# Patient Record
Sex: Male | Born: 1960 | Race: White | Hispanic: No | Smoking: Former smoker
Health system: Southern US, Community
[De-identification: ages and names within clinical notes are randomized; demographics above are authoritative.]

## PROBLEM LIST (undated history)

## (undated) DIAGNOSIS — I10 Essential (primary) hypertension: Secondary | ICD-10-CM

## (undated) DIAGNOSIS — Z95810 Presence of automatic (implantable) cardiac defibrillator: Secondary | ICD-10-CM

## (undated) DIAGNOSIS — J45909 Unspecified asthma, uncomplicated: Secondary | ICD-10-CM

## (undated) DIAGNOSIS — F1320 Sedative, hypnotic or anxiolytic dependence, uncomplicated: Secondary | ICD-10-CM

## (undated) DIAGNOSIS — K5790 Diverticulosis of intestine, part unspecified, without perforation or abscess without bleeding: Secondary | ICD-10-CM

## (undated) DIAGNOSIS — K7460 Unspecified cirrhosis of liver: Secondary | ICD-10-CM

## (undated) DIAGNOSIS — F329 Major depressive disorder, single episode, unspecified: Secondary | ICD-10-CM

## (undated) DIAGNOSIS — N184 Chronic kidney disease, stage 4 (severe): Secondary | ICD-10-CM

## (undated) DIAGNOSIS — F32A Depression, unspecified: Secondary | ICD-10-CM

## (undated) DIAGNOSIS — E079 Disorder of thyroid, unspecified: Secondary | ICD-10-CM

## (undated) DIAGNOSIS — E785 Hyperlipidemia, unspecified: Secondary | ICD-10-CM

## (undated) DIAGNOSIS — M1990 Unspecified osteoarthritis, unspecified site: Secondary | ICD-10-CM

## (undated) DIAGNOSIS — G4730 Sleep apnea, unspecified: Secondary | ICD-10-CM

## (undated) DIAGNOSIS — B1910 Unspecified viral hepatitis B without hepatic coma: Secondary | ICD-10-CM

## (undated) DIAGNOSIS — I5022 Chronic systolic (congestive) heart failure: Secondary | ICD-10-CM

## (undated) DIAGNOSIS — D509 Iron deficiency anemia, unspecified: Secondary | ICD-10-CM

## (undated) DIAGNOSIS — I4819 Other persistent atrial fibrillation: Secondary | ICD-10-CM

## (undated) DIAGNOSIS — I2510 Atherosclerotic heart disease of native coronary artery without angina pectoris: Secondary | ICD-10-CM

## (undated) DIAGNOSIS — I480 Paroxysmal atrial fibrillation: Secondary | ICD-10-CM

## (undated) DIAGNOSIS — H35039 Hypertensive retinopathy, unspecified eye: Secondary | ICD-10-CM

## (undated) DIAGNOSIS — F419 Anxiety disorder, unspecified: Secondary | ICD-10-CM

## (undated) DIAGNOSIS — E669 Obesity, unspecified: Secondary | ICD-10-CM

## (undated) DIAGNOSIS — F1411 Cocaine abuse, in remission: Secondary | ICD-10-CM

## (undated) DIAGNOSIS — I639 Cerebral infarction, unspecified: Secondary | ICD-10-CM

## (undated) HISTORY — DX: Unspecified osteoarthritis, unspecified site: M19.90

## (undated) HISTORY — PX: PACEMAKER INSERTION: SHX728

## (undated) HISTORY — DX: Unspecified viral hepatitis B without hepatic coma: B19.10

## (undated) HISTORY — DX: Iron deficiency anemia, unspecified: D50.9

## (undated) HISTORY — DX: Cocaine abuse, in remission: F14.11

## (undated) HISTORY — DX: Disorder of thyroid, unspecified: E07.9

## (undated) HISTORY — DX: Depression, unspecified: F32.A

## (undated) HISTORY — DX: Sedative, hypnotic or anxiolytic dependence, uncomplicated: F13.20

## (undated) HISTORY — PX: EYE SURGERY: SHX253

## (undated) HISTORY — PX: TONSILLECTOMY: SUR1361

## (undated) HISTORY — DX: Anxiety disorder, unspecified: F41.9

## (undated) HISTORY — DX: Major depressive disorder, single episode, unspecified: F32.9

## (undated) HISTORY — PX: CATARACT EXTRACTION: SUR2

## (undated) HISTORY — DX: Other persistent atrial fibrillation: I48.19

## (undated) HISTORY — PX: LASIK: SHX215

## (undated) HISTORY — DX: Hypertensive retinopathy, unspecified eye: H35.039

## (undated) HISTORY — DX: Unspecified cirrhosis of liver: K74.60

## (undated) HISTORY — DX: Unspecified asthma, uncomplicated: J45.909

## (undated) HISTORY — DX: Chronic systolic (congestive) heart failure: I50.22

## (undated) HISTORY — PX: TRANSTHORACIC ECHOCARDIOGRAM: SHX275

## (undated) HISTORY — DX: Sleep apnea, unspecified: G47.30

## (undated) HISTORY — DX: Essential (primary) hypertension: I10

## (undated) HISTORY — DX: Hyperlipidemia, unspecified: E78.5

## (undated) HISTORY — DX: Diverticulosis of intestine, part unspecified, without perforation or abscess without bleeding: K57.90

---

## 2002-05-02 DIAGNOSIS — I639 Cerebral infarction, unspecified: Secondary | ICD-10-CM

## 2002-05-02 HISTORY — DX: Cerebral infarction, unspecified: I63.9

## 2004-01-01 ENCOUNTER — Emergency Department (HOSPITAL_COMMUNITY): Admission: EM | Admit: 2004-01-01 | Discharge: 2004-01-01 | Payer: Self-pay | Admitting: Emergency Medicine

## 2004-08-02 ENCOUNTER — Emergency Department (HOSPITAL_COMMUNITY): Admission: EM | Admit: 2004-08-02 | Discharge: 2004-08-02 | Payer: Self-pay | Admitting: Emergency Medicine

## 2004-08-26 ENCOUNTER — Ambulatory Visit: Payer: Self-pay | Admitting: Family Medicine

## 2004-08-31 ENCOUNTER — Ambulatory Visit: Payer: Self-pay | Admitting: *Deleted

## 2004-09-09 ENCOUNTER — Ambulatory Visit: Payer: Self-pay | Admitting: Family Medicine

## 2004-12-20 ENCOUNTER — Ambulatory Visit: Payer: Self-pay | Admitting: Family Medicine

## 2005-05-23 ENCOUNTER — Ambulatory Visit: Payer: Self-pay | Admitting: Family Medicine

## 2005-08-01 ENCOUNTER — Ambulatory Visit: Payer: Self-pay | Admitting: Nurse Practitioner

## 2005-10-24 ENCOUNTER — Ambulatory Visit: Payer: Self-pay | Admitting: Family Medicine

## 2005-12-08 ENCOUNTER — Ambulatory Visit (HOSPITAL_COMMUNITY): Payer: Self-pay | Admitting: *Deleted

## 2006-03-16 ENCOUNTER — Ambulatory Visit (HOSPITAL_COMMUNITY): Payer: Self-pay | Admitting: *Deleted

## 2006-06-20 ENCOUNTER — Ambulatory Visit (HOSPITAL_COMMUNITY): Payer: Self-pay | Admitting: *Deleted

## 2006-10-10 ENCOUNTER — Ambulatory Visit (HOSPITAL_COMMUNITY): Payer: Self-pay | Admitting: *Deleted

## 2006-10-25 ENCOUNTER — Ambulatory Visit: Payer: Self-pay | Admitting: Internal Medicine

## 2006-11-09 ENCOUNTER — Inpatient Hospital Stay (HOSPITAL_COMMUNITY): Admission: EM | Admit: 2006-11-09 | Discharge: 2006-11-15 | Payer: Self-pay | Admitting: Emergency Medicine

## 2006-11-09 ENCOUNTER — Ambulatory Visit: Payer: Self-pay | Admitting: Cardiology

## 2006-11-09 ENCOUNTER — Ambulatory Visit: Payer: Self-pay | Admitting: Internal Medicine

## 2006-12-14 ENCOUNTER — Ambulatory Visit: Payer: Self-pay | Admitting: Internal Medicine

## 2007-01-10 ENCOUNTER — Emergency Department (HOSPITAL_COMMUNITY): Admission: EM | Admit: 2007-01-10 | Discharge: 2007-01-10 | Payer: Self-pay | Admitting: Emergency Medicine

## 2007-01-17 ENCOUNTER — Encounter (INDEPENDENT_AMBULATORY_CARE_PROVIDER_SITE_OTHER): Payer: Self-pay | Admitting: *Deleted

## 2007-05-17 ENCOUNTER — Ambulatory Visit (HOSPITAL_COMMUNITY): Payer: Self-pay | Admitting: *Deleted

## 2007-08-13 ENCOUNTER — Encounter (INDEPENDENT_AMBULATORY_CARE_PROVIDER_SITE_OTHER): Payer: Self-pay | Admitting: Family Medicine

## 2007-08-13 ENCOUNTER — Ambulatory Visit: Payer: Self-pay | Admitting: Internal Medicine

## 2007-08-13 LAB — CONVERTED CEMR LAB
Alkaline Phosphatase: 64 units/L (ref 39–117)
BUN: 29 mg/dL — ABNORMAL HIGH (ref 6–23)
Glucose, Bld: 73 mg/dL (ref 70–99)
HDL: 36 mg/dL — ABNORMAL LOW (ref >39)
LDL Cholesterol: 109 mg/dL — ABNORMAL HIGH (ref 0–99)
Total Bilirubin: 0.5 mg/dL (ref 0.3–1.2)
Total CHOL/HDL Ratio: 5.6
Triglycerides: 283 mg/dL — ABNORMAL HIGH (ref <150)
VLDL: 57 mg/dL — ABNORMAL HIGH (ref 0–40)

## 2007-08-30 ENCOUNTER — Ambulatory Visit: Payer: Self-pay | Admitting: Internal Medicine

## 2009-10-03 ENCOUNTER — Inpatient Hospital Stay (HOSPITAL_COMMUNITY): Admission: EM | Admit: 2009-10-03 | Discharge: 2009-10-08 | Payer: Self-pay | Admitting: Emergency Medicine

## 2009-10-03 ENCOUNTER — Ambulatory Visit: Payer: Self-pay | Admitting: Cardiology

## 2009-10-05 ENCOUNTER — Encounter: Payer: Self-pay | Admitting: Cardiovascular Disease

## 2009-10-05 ENCOUNTER — Ambulatory Visit: Payer: Self-pay | Admitting: Psychiatry

## 2009-10-16 ENCOUNTER — Ambulatory Visit: Payer: Self-pay | Admitting: Family Medicine

## 2009-12-18 ENCOUNTER — Ambulatory Visit: Payer: Self-pay | Admitting: Cardiovascular Disease

## 2009-12-18 DIAGNOSIS — I10 Essential (primary) hypertension: Secondary | ICD-10-CM | POA: Insufficient documentation

## 2009-12-18 DIAGNOSIS — I5022 Chronic systolic (congestive) heart failure: Secondary | ICD-10-CM

## 2010-01-11 ENCOUNTER — Ambulatory Visit: Payer: Self-pay | Admitting: Cardiovascular Disease

## 2010-01-11 DIAGNOSIS — E785 Hyperlipidemia, unspecified: Secondary | ICD-10-CM | POA: Insufficient documentation

## 2010-01-20 ENCOUNTER — Telehealth (INDEPENDENT_AMBULATORY_CARE_PROVIDER_SITE_OTHER): Payer: Self-pay | Admitting: *Deleted

## 2010-01-21 ENCOUNTER — Ambulatory Visit: Payer: Self-pay | Admitting: Cardiology

## 2010-01-21 ENCOUNTER — Encounter: Payer: Self-pay | Admitting: Cardiovascular Disease

## 2010-01-21 ENCOUNTER — Ambulatory Visit (HOSPITAL_COMMUNITY): Admission: RE | Admit: 2010-01-21 | Discharge: 2010-01-21 | Payer: Self-pay | Admitting: Cardiovascular Disease

## 2010-01-21 ENCOUNTER — Ambulatory Visit: Payer: Self-pay

## 2010-01-21 ENCOUNTER — Encounter (INDEPENDENT_AMBULATORY_CARE_PROVIDER_SITE_OTHER): Payer: Self-pay | Admitting: *Deleted

## 2010-01-21 ENCOUNTER — Ambulatory Visit: Payer: Self-pay | Admitting: Internal Medicine

## 2010-02-15 ENCOUNTER — Ambulatory Visit: Payer: Self-pay | Admitting: Cardiovascular Disease

## 2010-02-16 ENCOUNTER — Encounter: Payer: Self-pay | Admitting: Cardiovascular Disease

## 2010-03-08 ENCOUNTER — Emergency Department (HOSPITAL_COMMUNITY)
Admission: EM | Admit: 2010-03-08 | Discharge: 2010-03-08 | Payer: Self-pay | Source: Home / Self Care | Admitting: Emergency Medicine

## 2010-03-15 ENCOUNTER — Ambulatory Visit: Payer: Self-pay | Admitting: Cardiovascular Disease

## 2010-03-16 LAB — CONVERTED CEMR LAB
BUN: 31 mg/dL — ABNORMAL HIGH (ref 6–23)
Calcium: 8.8 mg/dL (ref 8.4–10.5)
Creatinine, Ser: 1.7 mg/dL — ABNORMAL HIGH (ref 0.4–1.5)
Eosinophils Relative: 2.5 % (ref 0.0–5.0)
GFR calc non Af Amer: 47.34 mL/min (ref >60)
INR: 1 (ref 0.8–1.0)
Lymphocytes Relative: 17.5 % (ref 12.0–46.0)
Monocytes Relative: 9 % (ref 3.0–12.0)
Neutrophils Relative %: 70.6 % (ref 43.0–77.0)
Platelets: 187 10*3/uL (ref 150.0–400.0)
Potassium: 4.2 meq/L (ref 3.5–5.1)
Prothrombin Time: 10.7 s (ref 9.7–11.8)
WBC: 8.2 10*3/uL (ref 4.5–10.5)

## 2010-03-17 ENCOUNTER — Ambulatory Visit: Payer: Self-pay | Admitting: Cardiovascular Disease

## 2010-03-17 ENCOUNTER — Ambulatory Visit (HOSPITAL_COMMUNITY)
Admission: RE | Admit: 2010-03-17 | Discharge: 2010-03-17 | Payer: Self-pay | Source: Home / Self Care | Admitting: Cardiovascular Disease

## 2010-04-12 ENCOUNTER — Ambulatory Visit: Payer: Self-pay | Admitting: Cardiovascular Disease

## 2010-04-12 DIAGNOSIS — E7800 Pure hypercholesterolemia, unspecified: Secondary | ICD-10-CM

## 2010-04-19 ENCOUNTER — Encounter: Payer: Self-pay | Admitting: Cardiovascular Disease

## 2010-04-19 ENCOUNTER — Ambulatory Visit: Payer: Self-pay | Admitting: Cardiovascular Disease

## 2010-04-19 LAB — CONVERTED CEMR LAB
ALT: 63 units/L — ABNORMAL HIGH (ref 0–53)
AST: 43 units/L — ABNORMAL HIGH (ref 0–37)
Albumin: 3.7 g/dL (ref 3.5–5.2)
Alkaline Phosphatase: 69 units/L (ref 39–117)
HDL: 32.5 mg/dL — ABNORMAL LOW (ref >39.00)
Triglycerides: 84 mg/dL (ref 0.0–149.0)

## 2010-04-26 DIAGNOSIS — I2510 Atherosclerotic heart disease of native coronary artery without angina pectoris: Secondary | ICD-10-CM | POA: Insufficient documentation

## 2010-05-27 ENCOUNTER — Ambulatory Visit
Admission: RE | Admit: 2010-05-27 | Discharge: 2010-05-27 | Payer: Self-pay | Source: Home / Self Care | Attending: Internal Medicine | Admitting: Internal Medicine

## 2010-05-30 LAB — CONVERTED CEMR LAB
AST: 38 units/L — ABNORMAL HIGH (ref 0–37)
Albumin: 3.7 g/dL (ref 3.5–5.2)
BUN: 28 mg/dL — ABNORMAL HIGH (ref 6–23)
Basophils Relative: 0.3 % (ref 0.0–3.0)
Calcium: 9.2 mg/dL (ref 8.4–10.5)
Cholesterol: 178 mg/dL (ref 0–200)
Creatinine, Ser: 1.6 mg/dL — ABNORMAL HIGH (ref 0.4–1.5)
Eosinophils Absolute: 0.2 10*3/uL (ref 0.0–0.7)
GFR calc non Af Amer: 49.09 mL/min (ref >60)
Glucose, Bld: 61 mg/dL — ABNORMAL LOW (ref 70–99)
HDL: 36.2 mg/dL — ABNORMAL LOW (ref >39.00)
MCHC: 33.4 g/dL (ref 30.0–36.0)
MCV: 80.7 fL (ref 78.0–100.0)
Monocytes Absolute: 0.8 10*3/uL (ref 0.1–1.0)
Neutrophils Relative %: 70.6 % (ref 43.0–77.0)
RBC: 5.94 M/uL — ABNORMAL HIGH (ref 4.22–5.81)
VLDL: 26.6 mg/dL (ref 0.0–40.0)

## 2010-06-03 NOTE — Assessment & Plan Note (Signed)
Summary: 4 WKS/D.MILLER  Medications Added CARVEDILOL 25 MG TABS (CARVEDILOL) Take one tablet by mouth twice a day HYDRALAZINE HCL 50 MG TABS (HYDRALAZINE HCL) Take one tablet by mouth three times a day VENTOLIN HFA 108 (90 BASE) MCG/ACT AERS (ALBUTEROL SULFATE) 2 puffs as needed PRAVASTATIN SODIUM 40 MG TABS (PRAVASTATIN SODIUM) Take one tablet by mouth daily at bedtime        Visit Type:  Follow-up Primary Provider:  Dr Leward Quan  CC:  No complaints.  History of Present Illness: 50 year-old gentleman presenting for hospital follow-up after recent hospitalization for acute systolic heart failure. He has a long history of untreated malignant HTN and presented with CHF symptoms in the setting of severe HTN. An echo showed that his LVEF was less than 20% with global LV dysfunction and 4-chamber dilatation. Cardiac cath was not pursued because of significant renal dysfunction, presumably secondary to his HTN.  Overall the patient is doing well. He reports some improvement in BP readings, but still the majority of readings are elevated. He feels poorly when his BP is in the normal range. No chest pain, dyspnea, edema, or syncope. He has frequent diaphoresis.  Current Medications (verified): 1)  Carvedilol 12.5 Mg Tabs (Carvedilol) .... One Twice A Day 2)  Ferrous Sulfate 325 (65 Fe) Mg Tabs (Ferrous Sulfate) .... Take 1 Tablet By Mouth Two Times A Day 3)  Hydralazine Hcl 25 Mg Tabs (Hydralazine Hcl) .... Take 1 Tablet By Mouth Three Times A Day 4)  Isosorbide Mononitrate Cr 60 Mg Xr24h-Tab (Isosorbide Mononitrate) .... One Every Evening 5)  Lasix 40 Mg Tabs (Furosemide) .... Take 1 Tablet By Mouth Two Times A Day 6)  Potassium Chloride Crys Cr 20 Meq Cr-Tabs (Potassium Chloride Crys Cr) .... Take One Tablet By Mouth Twice A Day 7)  Ventolin Hfa 108 (90 Base) Mcg/act Aers (Albuterol Sulfate) .... 2 Puffs As Needed 8)  Aspirin 81 Mg Tbec (Aspirin) .... Take One Tablet By Mouth Daily 9)   Trazodone Hcl 50 Mg Tabs (Trazodone Hcl) .... As Needed 10)  Xanax 2 Mg Tabs (Alprazolam) .... Take 1 Tablet By Mouth Four Times A Day  Allergies: 1)  ! Codeine 2)  ! Vicodin  Past History:  Past medical history reviewed for relevance to current acute and chronic problems.  Past Medical History:  1. Acute decompensation of chronic systolic congestive heart failure, LVEF less than 20%.   2. Severe uncontrolled hypertension/hypertensive urgency.   3. History of cocaine use.   4. 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   5. Bronchial asthma.   6. Benzodiazepine dependency.   7. History of anxiety/depression.   8. Chronic kidney disease stage 3-4.   9. Microcytic anemia.   Review of Systems       Negative except as per HPI   Vital Signs:  Patient profile:   50 year old male Height:      74 inches Weight:      275.75 pounds BMI:     35.53 Pulse rate:   72 / minute Pulse rhythm:   regular Resp:     18 per minute BP sitting:   170 / 110  (left arm) Cuff size:   large  Vitals Entered By: Sidney Ace (January 11, 2010 4:19 PM)  Physical Exam  General:  Pt is alert and oriented, in no acute distress. HEENT: normal Neck: normal carotid upstrokes without bruits, JVP normal Lungs: CTA CV: RRR without murmur or gallop Abd: soft, NT, positive BS, no bruit,  no organomegaly Ext: no clubbing, cyanosis, or edema. peripheral pulses 2+ and equal Skin: warm and dry without rash    Impression & Recommendations:  Problem # 1:  HYPERTENSION, HEART CONTROLLED W/ CHF (ICD-402.11) Recommend increase carvedilol to 25 mg two times a day and hydralazine to 50 mg three times a day. Continue to monitor BP's at home. Call if symptoms of hypotension occur (these were reviewed with the patient today).  His updated medication list for this problem includes:    Carvedilol 25 Mg Tabs (Carvedilol) .Marland Kitchen... Take one tablet by mouth twice a day    Hydralazine Hcl 50 Mg Tabs (Hydralazine hcl)  .Marland Kitchen... Take one tablet by mouth three times a day    Lasix 40 Mg Tabs (Furosemide) .Marland Kitchen... Take 1 tablet by mouth two times a day    Aspirin 81 Mg Tbec (Aspirin) .Marland Kitchen... Take one tablet by mouth daily  Orders: Nuclear Stress Test (Nuc Stress Test) Echocardiogram (Echo)  Problem # 2:  CHRONIC SYSTOLIC HEART FAILURE (123456) Tolerating medical therapy well. Will followup with an echocardiogram to reassess LV function. Will consider starting an ACE or ARB if his renal function remains stable (creatinine 1.6 mg/dL on recent check). Also will check a pharmacolgic Myoview stress study to rule out ischemia as a cause of his cardiomyopathy, but I suspect this is related to uncontrolled HTN and polysubstance abuse.  His updated medication list for this problem includes:    Carvedilol 25 Mg Tabs (Carvedilol) .Marland Kitchen... Take one tablet by mouth twice a day    Isosorbide Mononitrate Cr 60 Mg Xr24h-tab (Isosorbide mononitrate) ..... One every evening    Lasix 40 Mg Tabs (Furosemide) .Marland Kitchen... Take 1 tablet by mouth two times a day    Aspirin 81 Mg Tbec (Aspirin) .Marland Kitchen... Take one tablet by mouth daily  Orders: Nuclear Stress Test (Nuc Stress Test) Echocardiogram (Echo)  Problem # 3:  HYPERLIPIDEMIA-MIXED (ICD-272.4) Recommend start prachol 40 mg daily and f/u lipids and lft's in 3 months.  His updated medication list for this problem includes:    Pravastatin Sodium 40 Mg Tabs (Pravastatin sodium) .Marland Kitchen... Take one tablet by mouth daily at bedtime  CHOL: 178 (12/18/2009)   LDL: 115 (12/18/2009)   HDL: 36.20 (12/18/2009)   TG: 133.0 (12/18/2009)  Patient Instructions: 1)  Your physician recommends that you schedule a follow-up appointment in: 2 months 2)  Your physician has recommended you make the following change in your medication: INCREASE CARVEDILOL to 25mg  by mouth two times a day, INCREASE HYDRALAZINE to 50mg  three times a day by mouth, ADD PRAVASTATIN 40mg  by mouth daily. 3)  Your physician has requested  that you have an echocardiogram.  Echocardiography is a painless test that uses sound waves to create images of your heart. It provides your doctor with information about the size and shape of your heart and how well your heart's chambers and valves are working.  This procedure takes approximately one hour. There are no restrictions for this procedure. 4)  Your physician has requested that you have an [place].  For further information please visit [email or web address].  Please follow instruction sheet, as given. 5)  Your physician recommends that you return for a FASTING lipid profile and liver function test in 3 months. Prescriptions: PRAVASTATIN SODIUM 40 MG TABS (PRAVASTATIN SODIUM) Take one tablet by mouth daily at bedtime  #30 x 3   Entered by:   Whitney Jannett Celestine RN   Authorized by:   Neale Burly, MD   Signed  by:   Mikle Bosworth RN on 01/11/2010   Method used:   Faxed to ...       [facility] (retail)       [place], [place]  [address]       Ph: [phone] [place]       Fax: [phone]   RxID:   [id number] HYDRALAZINE HCL 50 MG TABS (HYDRALAZINE HCL) Take one tablet by mouth three times a day  #90 x 3   Entered by:   Whitney Jannett Celestine RN   Authorized by:   Neale Burly, MD   Signed by:   Mikle Bosworth RN on 01/11/2010   Method used:   Faxed to ...       [facility] (retail)       [place], [place]  [address]       Ph: [phone]       Fax: [phone]   RxID:   [id number] CARVEDILOL 25 MG TABS (CARVEDILOL) Take one tablet by mouth twice a day  #60 x 3   Entered by:   Whitney Jannett Celestine RN   Authorized by:   Neale Burly, MD   Signed by:   Mikle Bosworth RN on 01/11/2010   Method used:   Faxed to ...       [facility] (retail)       [place],   [address]        Ph: [phone]       Fax: [phone]   RxID:   [id number]

## 2010-06-03 NOTE — Assessment & Plan Note (Signed)
Summary: eph   Visit Type:  Post-hospital Primary Provider:  Dr Leward Quan  CC:  Chest congestion.  History of Present Illness: 50 year-old gentleman presenting for malignant HTN and severely depressed LV function. He recently underwent cardiac cath showing severe diagonal stenosis and moderate RCA stenosis. Medical therapy was recommended because of an absence of angina. The patient reports no new compaints today. BP continues to run very high, even on multiple antihypertensives. Denies chest pain or dyspnea. He complains of generalized fatigue.  Current Medications (verified): 1)  Carvedilol 25 Mg Tabs (Carvedilol) .... Take One Tablet By Mouth Twice A Day 2)  Ferrous Sulfate 325 (65 Fe) Mg Tabs (Ferrous Sulfate) .... Take 1 Tablet By Mouth Two Times A Day 3)  Hydralazine Hcl 50 Mg Tabs (Hydralazine Hcl) .... Take One Tablet By Mouth Three Times A Day 4)  Isosorbide Mononitrate Cr 30 Mg Xr24h-Tab (Isosorbide Mononitrate) .... Take  2 Tablet By Mouth Daily 5)  Lasix 40 Mg Tabs (Furosemide) .... Take 1 Tablet By Mouth Two Times A Day 6)  Potassium Chloride Crys Cr 20 Meq Cr-Tabs (Potassium Chloride Crys Cr) .... Take One Tablet By Mouth Twice A Day 7)  Ventolin Hfa 108 (90 Base) Mcg/act Aers (Albuterol Sulfate) .... 2 Puffs As Needed 8)  Aspirin 81 Mg Tbec (Aspirin) .... Take One Tablet By Mouth Daily 9)  Trazodone Hcl 50 Mg Tabs (Trazodone Hcl) .... As Needed 10)  Xanax 2 Mg Tabs (Alprazolam) .... Take 1 Tablet By Mouth Four Times A Day 11)  Pravastatin Sodium 40 Mg Tabs (Pravastatin Sodium) .... Take One Tablet By Mouth Daily At Bedtime  Allergies: 1)  ! Codeine 2)  ! Vicodin  Past History:  Past medical history reviewed for relevance to current acute and chronic problems.  Past Medical History: Reviewed history from 01/11/2010 and no changes required.  1. Acute decompensation of chronic systolic congestive heart failure, LVEF less than 20%.   2. Severe uncontrolled  hypertension/hypertensive urgency.   3. History of cocaine use.   4. 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   5. Bronchial asthma.   6. Benzodiazepine dependency.   7. History of anxiety/depression.   8. Chronic kidney disease stage 3-4.   9. Microcytic anemia.   Review of Systems       Negative except as per HPI   Vital Signs:  Patient profile:   50 year old male Height:      74 inches Weight:      284.50 pounds BMI:     36.66 Pulse rate:   64 / minute Pulse rhythm:   regular Resp:     18 per minute BP sitting:   180 / 100  (left arm) Cuff size:   large  Vitals Entered By: Sidney Ace (April 19, 2010 3:33 PM)  Physical Exam  General:  Pt is alert and oriented, in no acute distress. HEENT: normal Neck: normal carotid upstrokes without bruits, JVP normal Lungs: CTA CV: RRR without murmur or gallop Abd: soft, NT, positive BS, no bruit, no organomegaly Ext: no clubbing, cyanosis, or edema. peripheral pulses 2+ and equal Skin: warm and dry without rash    EKG  Procedure date:  04/19/2010  Findings:      NSR 64 bpm, nonspecific T wave abnormality  Impression & Recommendations:  Problem # 1:  HYPERTENSION, HEART CONTROLLED W/ CHF (ICD-402.11) BP remains uncontrolled. Renals were widely patent at cath. Double carvedilol to 50 mg two times a day and add norvasc 5 mg daily.  His updated medication list for this problem includes:    Carvedilol 25 Mg Tabs (Carvedilol) .Marland Kitchen... Take two tablets by mouth twice a day    Hydralazine Hcl 50 Mg Tabs (Hydralazine hcl) .Marland Kitchen... Take one tablet by mouth three times a day    Lasix 40 Mg Tabs (Furosemide) .Marland Kitchen... Take 1 tablet by mouth two times a day    Aspirin 81 Mg Tbec (Aspirin) .Marland Kitchen... Take one tablet by mouth daily    Amlodipine Besylate 5 Mg Tabs (Amlodipine besylate) .Marland Kitchen... Take one tablet by mouth daily  Orders: EKG w/ Interpretation (93000) EP Referral (Cardiology EP Ref )  BP today: 180/100 Prior BP: 180/110  (02/15/2010)  Labs Reviewed: K+: 4.2 (03/15/2010) Creat: : 1.7 (03/15/2010)   Chol: 129 (04/12/2010)   HDL: 32.50 (04/12/2010)   LDL: 80 (04/12/2010)   TG: 84.0 (04/12/2010)  Problem # 2:  PURE HYPERCHOLESTEROLEMIA (ICD-272.0) Lipids at goal on current Rx.  His updated medication list for this problem includes:    Pravastatin Sodium 40 Mg Tabs (Pravastatin sodium) .Marland Kitchen... Take one tablet by mouth daily at bedtime  CHOL: 129 (04/12/2010)   LDL: 80 (04/12/2010)   HDL: 32.50 (04/12/2010)   TG: 84.0 (04/12/2010)  Problem # 3:  CHRONIC SYSTOLIC HEART FAILURE (123456) Class 2 symptoms, LVEF less than 35%. Suspect nonischemic cardiomyopathy (LV dysfunction far out of proportion extent of CAD). Med changes as outlined. EP consult for consideration of ICD.  His updated medication list for this problem includes:    Carvedilol 25 Mg Tabs (Carvedilol) .Marland Kitchen... Take two tablets by mouth twice a day    Isosorbide Mononitrate Cr 30 Mg Xr24h-tab (Isosorbide mononitrate) .Marland Kitchen... Take  2 tablet by mouth daily    Lasix 40 Mg Tabs (Furosemide) .Marland Kitchen... Take 1 tablet by mouth two times a day    Aspirin 81 Mg Tbec (Aspirin) .Marland Kitchen... Take one tablet by mouth daily    Amlodipine Besylate 5 Mg Tabs (Amlodipine besylate) .Marland Kitchen... Take one tablet by mouth daily  Orders: EKG w/ Interpretation (93000) EP Referral (Cardiology EP Ref )  Problem # 4:  CAD, NATIVE VESSEL (ICD-414.01) Medical therapy - no anginal symptoms.  His updated medication list for this problem includes:    Carvedilol 25 Mg Tabs (Carvedilol) .Marland Kitchen... Take two tablets by mouth twice a day    Isosorbide Mononitrate Cr 30 Mg Xr24h-tab (Isosorbide mononitrate) .Marland Kitchen... Take  2 tablet by mouth daily    Aspirin 81 Mg Tbec (Aspirin) .Marland Kitchen... Take one tablet by mouth daily    Amlodipine Besylate 5 Mg Tabs (Amlodipine besylate) .Marland Kitchen... Take one tablet by mouth daily  Patient Instructions: 1)  Your physician recommends that you schedule a follow-up appointment in: 3  MONTHS 2)  Your physician has recommended you make the following change in your medication: INCREASE Carvedilol to 25mg  take two tablets by mouth two times a day, START Amlodipine 5mg  take one tablet by mouth daily 3)  You have been referred to EP for ICD consideration.  Prescriptions: AMLODIPINE BESYLATE 5 MG TABS (AMLODIPINE BESYLATE) Take one tablet by mouth daily  #30 x 6   Entered by:   Theodosia Quay, RN, BSN   Authorized by:   Neale Burly, MD   Signed by:   Theodosia Quay, RN, BSN on 04/19/2010   Method used:   Print then Give to Patient   RxID:   [id number] CARVEDILOL 25 MG TABS (CARVEDILOL) Take two tablets by mouth twice a day  #120 x 6   Entered by:  Theodosia Quay, RN, BSN   Authorized by:   Neale Burly, MD   Signed by:   Theodosia Quay, RN, BSN on 04/19/2010   Method used:   Print then Give to Patient   RxID:   [id number]

## 2010-06-03 NOTE — Miscellaneous (Signed)
Summary: Myoview cancelled due to marked HTN  Patient here for myoview with baseline BP sitting of 193/136 and supine 207/128.   He had held his Coreg this a.m. for is myoview.   Dr. Harold Hedge gave order for clonidine 0.1 mg and recheck BP in 30-minutes.  2:03 pm clonidine 0.1mg , 2:40 pm BP (LA) 220/130, (RA) 210/120, repeat clonidine 0.1mg  per Dr. Verl Blalock; 3:10 pm BP 189/112 supine, 174/130 sitting; 3:20 pm repeat clonidine 0.1mg  per Dr. Verl Blalock, cancel myoview and Dr. Verl Blalock to see patient.  Appended Document: Myoview cancelled due to marked HTN I had him brought to her area. We gave him a supplemental nitroglycerin after he had 3 clonidine 0.1 tablets. His blood pressure is now down to 178/112 which he says is normal for him the office. He clearly has an anxiety disorder as he stated that it dropped his pressure when he comes the office. He is clinically stable with no headache, chest pain, shortness of breath or any other type of decompensation.  Advised to take his carvedilol when he gets which is 25 mg. We will reschedule his [place] scan but hadn't taken his carvedilol prior that study regardless. He will probably be high again but if we can achieve a study as ordered by Dr. Burt Knack.  Total times a day with patient care was 30 minutes to 45 minutes.

## 2010-06-03 NOTE — Assessment & Plan Note (Signed)
Summary: 4:30/discuss echo results and need for cath   Visit Type:  Follow-up Primary Provider:  Dr Leward Quan  CC:  Anxiety.  History of Present Illness: 50 year-old gentleman presenting for malignant HTN and severely depressed LV function. He has an LVEF less than 20%, but has not had an ischemic evaluation as of yet. He was scheduled for a nuclear study but this was cancelled because of markedly elevated BP. He has not undergone catheterization because of renal dysfunction.  The patient reports no exertional symptoms at present. He has episodes where he feels lethargic, but otherwise has no complaints at present. He denies chest pain, dyspnea, or edema. He reports labile BP's, as low as 120/70's but up to 180/110 in the early morning or when he is upset.  He is having some difficulty keeping up with his medications through [facility].  Current Medications (verified): 1)  Carvedilol 25 Mg Tabs (Carvedilol) .... Take One Tablet By Mouth Twice A Day 2)  Ferrous Sulfate 325 (65 Fe) Mg Tabs (Ferrous Sulfate) .... Take 1 Tablet By Mouth Two Times A Day 3)  Hydralazine Hcl 50 Mg Tabs (Hydralazine Hcl) .... Take One Tablet By Mouth Three Times A Day 4)  Isosorbide Mononitrate Cr 30 Mg Xr24h-Tab (Isosorbide Mononitrate) .... Take  2 Tablet By Mouth Daily 5)  Lasix 40 Mg Tabs (Furosemide) .... Take 1 Tablet By Mouth Two Times A Day 6)  Potassium Chloride Crys Cr 20 Meq Cr-Tabs (Potassium Chloride Crys Cr) .... Take One Tablet By Mouth Twice A Day 7)  Ventolin Hfa 108 (90 Base) Mcg/act Aers (Albuterol Sulfate) .... 2 Puffs As Needed 8)  Aspirin 81 Mg Tbec (Aspirin) .... Take One Tablet By Mouth Daily 9)  Trazodone Hcl 50 Mg Tabs (Trazodone Hcl) .... As Needed 10)  Xanax 2 Mg Tabs (Alprazolam) .... Take 1 Tablet By Mouth Four Times A Day 11)  Pravastatin Sodium 40 Mg Tabs (Pravastatin Sodium) .... Take One Tablet By Mouth Daily At Bedtime  Allergies: 1)  ! Codeine 2)  ! Vicodin  Past  History:  Past medical history reviewed for relevance to current acute and chronic problems.  Past Medical History: Reviewed history from 01/11/2010 and no changes required.  1. Acute decompensation of chronic systolic congestive heart failure, LVEF less than 20%.   2. Severe uncontrolled hypertension/hypertensive urgency.   3. History of cocaine use.   4. 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   5. Bronchial asthma.   6. Benzodiazepine dependency.   7. History of anxiety/depression.   8. Chronic kidney disease stage 3-4.   9. Microcytic anemia.   Review of Systems       Negative except as per HPI   Vital Signs:  Patient profile:   50 year old male Height:      74 inches Weight:      281.50 pounds BMI:     36.27 Pulse rate:   64 / minute Pulse rhythm:   regular Resp:     20 per minute BP sitting:   180 / 110  (left arm) Cuff size:   large  Vitals Entered By: Sidney Ace (February 15, 2010 4:38 PM)  Physical Exam  General:  Pt is alert and oriented, in no acute distress. HEENT: normal Neck: normal carotid upstrokes without bruits, JVP normal Lungs: CTA CV: RRR without murmur or gallop Abd: soft, NT, positive BS, no bruit, no organomegaly Ext: no clubbing, cyanosis, or edema. peripheral pulses 2+ and equal Skin: warm and dry without rash  EKG  Procedure date:  02/15/2010  Findings:      NSR 64 bpm, T wave abnormality consider lateral ischemia.  Echocardiogram  Procedure date:  01/21/2010  Findings:      Study Conclusions            - Left ventricle: The cavity size was moderately dilated. Wall       thickness was increased in a pattern of moderate LVH. There was       moderate concentric hypertrophy. Systolic function was severely       reduced. The estimated ejection fraction was in the range of 20%       to 25%. Diffuse hypokinesis. Doppler parameters are consistent       with abnormal left ventricular relaxation (grade 1 diastolic        dysfunction). Doppler parameters are consistent with high       ventricular filling pressure.     - Left atrium: The atrium was moderately dilated.     - Right ventricle: Hypertrophy was present.     - Right atrium: The atrium was mildly dilated.  Impression & Recommendations:  Problem # 1:  CHRONIC SYSTOLIC HEART FAILURE (123456) Pt's LV function remains severely depressed, despite several months of medical therapy and cessation of substance abuse. I think coronary angiography is indicated to evaluate CAD as a possible cause of cardiomyopathy. Risks, indication, and alternatives were reviewed in detail with the patient and he agrees to proceed.   His updated medication list for this problem includes:    Carvedilol 25 Mg Tabs (Carvedilol) .Marland Kitchen... Take one tablet by mouth twice a day    Isosorbide Mononitrate Cr 30 Mg Xr24h-tab (Isosorbide mononitrate) .Marland Kitchen... Take  2 tablet by mouth daily    Lasix 40 Mg Tabs (Furosemide) .Marland Kitchen... Take 1 tablet by mouth two times a day    Aspirin 81 Mg Tbec (Aspirin) .Marland Kitchen... Take one tablet by mouth daily  Orders: EKG w/ Interpretation (93000) Cardiac Catheterization (Cardiac Cath)  Problem # 2:  HYPERTENSION, HEART CONTROLLED W/ CHF (ICD-402.11) BP is labile with marked white coat HTN. Will continue current Rx, plan renal angio at the time of cath if creatinine is ok. ACE/ARB is contraindicated because of renal dysfunction, but may consider a trial of this if the patient's creatiinine is < 2.0 mg/dL after his cardiac cath.  His updated medication list for this problem includes:    Carvedilol 25 Mg Tabs (Carvedilol) .Marland Kitchen... Take one tablet by mouth twice a day    Hydralazine Hcl 50 Mg Tabs (Hydralazine hcl) .Marland Kitchen... Take one tablet by mouth three times a day    Lasix 40 Mg Tabs (Furosemide) .Marland Kitchen... Take 1 tablet by mouth two times a day    Aspirin 81 Mg Tbec (Aspirin) .Marland Kitchen... Take one tablet by mouth daily  Problem # 3:  HYPERLIPIDEMIA-MIXED (ICD-272.4) Assessment:  Unchanged  His updated medication list for this problem includes:    Pravastatin Sodium 40 Mg Tabs (Pravastatin sodium) .Marland Kitchen... Take one tablet by mouth daily at bedtime  CHOL: 178 (12/18/2009)   LDL: 115 (12/18/2009)   HDL: 36.20 (12/18/2009)   TG: 133.0 (12/18/2009)  Patient Instructions: 1)  Your physician recommends that you schedule a follow-up appointment. To be determined post cath. 2)  Your physician recommends that you continue on your current medications as directed. Please refer to the Current Medication list given to you today. 3)  Your physician has requested that you have a cardiac catheterization on 03/17/10 @ 8:30am. Please  be there at 6:30 am.   Cardiac catheterization is used to diagnose and/or treat various heart conditions. Doctors may recommend this procedure for a number of different reasons. The most common reason is to evaluate chest pain. Chest pain can be a symptom of coronary artery disease (CAD), and cardiac catheterization can show whether plaque is narrowing or blocking your heart's arteries. This procedure is also used to evaluate the valves, as well as measure the blood flow and oxygen levels in different parts of your heart.  For further information please visit [email or web address].  Please follow instruction sheet, as given.

## 2010-06-03 NOTE — Assessment & Plan Note (Signed)
Summary: to discuss ICD/saf   Visit Type:  Follow-up Primary Provider:  Dr Leward Quan  CC:  abdominal discomfort after taking meds. joint pain. chest congestion.  History of Present Illness: Cody Ortega is referred today by Dr. Burt Knack for consideration for ICD implant. He is a pleasant 50 yo man with a DCM/ICM with class 2 CHF, s/p MI. He has never had syncope. He is able to do almost anything he wants on good days. On bad days, he has class 3 symptoms. He currently denies c/p or peripheral edema. He has an EF 20-25%. No other complaints.  Current Medications (verified): 1)  Carvedilol 25 Mg Tabs (Carvedilol) .... Take Two Tablets By Mouth Twice A Day 2)  Ferrous Sulfate 325 (65 Fe) Mg Tabs (Ferrous Sulfate) .... Take 1 Tablet By Mouth Two Times A Day 3)  Hydralazine Hcl 50 Mg Tabs (Hydralazine Hcl) .... Take One Tablet By Mouth Three Times A Day 4)  Isosorbide Mononitrate Cr 30 Mg Xr24h-Tab (Isosorbide Mononitrate) .... Take  2 Tablet By Mouth Daily 5)  Lasix 40 Mg Tabs (Furosemide) .... Take 1 Tablet By Mouth Two Times A Day 6)  Potassium Chloride Crys Cr 20 Meq Cr-Tabs (Potassium Chloride Crys Cr) .... Take One Tablet By Mouth Twice A Day 7)  Ventolin Hfa 108 (90 Base) Mcg/act Aers (Albuterol Sulfate) .... 2 Puffs As Needed 8)  Aspirin 81 Mg Tbec (Aspirin) .... Take One Tablet By Mouth Daily 9)  Trazodone Hcl 50 Mg Tabs (Trazodone Hcl) .... As Needed 10)  Xanax 2 Mg Tabs (Alprazolam) .... Take 1 Tablet By Mouth Four Times A Day 11)  Pravastatin Sodium 40 Mg Tabs (Pravastatin Sodium) .... Take One Tablet By Mouth Daily At Bedtime 12)  Amlodipine Besylate 5 Mg Tabs (Amlodipine Besylate) .... Take One Tablet By Mouth Daily  Allergies (verified): 1)  ! Codeine 2)  ! Vicodin  Past History:  Past Medical History: Last updated: 01/11/2010  1. Acute decompensation of chronic systolic congestive heart failure, LVEF less than 20%.   2. Severe uncontrolled hypertension/hypertensive urgency.     3. History of cocaine use.   4. 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   5. Bronchial asthma.   6. Benzodiazepine dependency.   7. History of anxiety/depression.   8. Chronic kidney disease stage 3-4.   9. Microcytic anemia.   Past Surgical History: Last updated: 12/16/2009 Tonsillectomy  Review of Systems       All systems reviewed and negative except as noted in the HPI.  Vital Signs:  Patient profile:   50 year old male Height:      74 inches Weight:      294.50 pounds Pulse rate:   76 / minute Resp:     18 per minute BP supine:   132 / 68  Vitals Entered By: Evelene Croon, CMA (May 27, 2010 4:25 PM)  Physical Exam  General:  Pt is alert and oriented, in no acute distress. HEENT: normal Neck: normal carotid upstrokes without bruits, JVP normal Lungs: CTA CV: RRR without murmur or gallop Abd: soft, NT, positive BS, no bruit, no organomegaly Ext: no clubbing, cyanosis, or edema. peripheral pulses 2+ and equal Skin: warm and dry without rash    Impression & Recommendations:  Problem # 1:  CHRONIC SYSTOLIC HEART FAILURE (123456) His symptoms are mostly class 2. I have discussed the treatment options with the patient. He has an indication for ICD implant and I have recommended this to him. He is considering and tells [place]  that he will call us if he choses to proceed with an ICD. He understands that he is at risk for sudden death and an ICD might prevent this. His updated medication list for this problem includes:    Carvedilol 25 Mg Tabs (Carvedilol) .Marland Kitchen... Take two tablets by mouth twice a day    Isosorbide Mononitrate Cr 30 Mg Xr24h-tab (Isosorbide mononitrate) .Marland Kitchen... Take  2 tablet by mouth daily    Lasix 40 Mg Tabs (Furosemide) .Marland Kitchen... Take 1 tablet by mouth two times a day    Aspirin 81 Mg Tbec (Aspirin) .Marland Kitchen... Take one tablet by mouth daily    Amlodipine Besylate 5 Mg Tabs (Amlodipine besylate) .Marland Kitchen... Take one tablet by mouth daily  Problem # 2:  CAD,  NATIVE VESSEL (ICD-414.01) His symptoms are well controlled. He will continue her current meds. His updated medication list for this problem includes:    Carvedilol 25 Mg Tabs (Carvedilol) .Marland Kitchen... Take two tablets by mouth twice a day    Isosorbide Mononitrate Cr 30 Mg Xr24h-tab (Isosorbide mononitrate) .Marland Kitchen... Take  2 tablet by mouth daily    Aspirin 81 Mg Tbec (Aspirin) .Marland Kitchen... Take one tablet by mouth daily    Amlodipine Besylate 5 Mg Tabs (Amlodipine besylate) .Marland Kitchen... Take one tablet by mouth daily  Problem # 3:  HYPERTENSION, HEART CONTROLLED W/ CHF (ICD-402.11) His blood pressure is reasonably well controlled provided him continues to take his medications. His updated medication list for this problem includes:    Carvedilol 25 Mg Tabs (Carvedilol) .Marland Kitchen... Take two tablets by mouth twice a day    Hydralazine Hcl 50 Mg Tabs (Hydralazine hcl) .Marland Kitchen... Take one tablet by mouth three times a day    Lasix 40 Mg Tabs (Furosemide) .Marland Kitchen... Take 1 tablet by mouth two times a day    Aspirin 81 Mg Tbec (Aspirin) .Marland Kitchen... Take one tablet by mouth daily    Amlodipine Besylate 5 Mg Tabs (Amlodipine besylate) .Marland Kitchen... Take one tablet by mouth daily  Patient Instructions: 1)  Your physician recommends that you schedule a follow-up appointment in: as needed 2)  Patient will call us if he wishes to proceed with ICD implant

## 2010-06-03 NOTE — Cardiovascular Report (Signed)
Summary: Cath/Percutaneous Orders   Cath/Percutaneous Orders   Imported By: Sallee Provencal 03/03/2010 11:46:13  _____________________________________________________________________  External Attachment:    Type:   Image     Comment:   External Document

## 2010-06-03 NOTE — Letter (Signed)
Summary: Cardiac Catheterization Instructions- Main Lab  [place] [place]  [id number] [place]   [place], [place] [address]   Phone: [phone]  Fax: [phone]     02/15/2010 MRN: [id number]  [address], [place]  [id number]  Dear Mr. Jagiello,   You are scheduled for Cardiac Catheterization on 03/17/10              with Dr.Cooper.  Please arrive at the [facility] at 6:30       a.m. on the day of your procedure.  1. DIET     __X__ Nothing to eat or drink after midnight except your medications with a sip of water.  2. Come to the [place] office on Nov.14th, 2011 for lab work.  The lab at [facility] is open from 8:30 a.m. to 1:30 p.m. and 2:30 p.m. to 5:00 p.m.  The lab at [facility] is open from 7:30 a.m. to 5:30 p.m.  You do not have to be fasting.  3. MAKE SURE YOU TAKE YOUR ASPIRIN.        __X__ YOU MAY TAKE ALL of your remaining medications with a small amount of water.   4. Plan for one night stay - bring personal belongings (i.e. toothpaste, toothbrush, etc.)  5. Bring a current list of your medications and current insurance cards.  6. Must have a responsible person to drive you home.   7. Someone must be with yu for the first 24 hours after you arrive home.  8. Please wear clothes that are easy to get on and off and wear slip-on shoes.  *Special note: Every effort is made to have your procedure done on time.  Occasionally there are emergencies that present themselves at the hospital that may cause delays.  Please be patient if a delay does occur.  If you have any questions after you get home, please call the office at the number listed above.  Whitney Jannett Celestine RN

## 2010-06-03 NOTE — Progress Notes (Signed)
Summary: Nuclear Pre-Procedure  Phone Note Outgoing Call Call back at [facility] Phone [phone]   Call placed by: Eliezer Lofts, EMT-P,  January 20, 2010 2:49 PM Call placed to: Patient Action Taken: Phone Call Completed Summary of Call: Reviewed information on Myoview Information Sheet (see scanned document for further details).  Spoke with the Patient, and left instructions on voice mail, as requested.    Nuclear Med Background Indications for Stress Test: Evaluation for Ischemia   History: Asthma, Echo   Symptoms: SOB    Nuclear Pre-Procedure Cardiac Risk Factors: Hypertension, Lipids Height (in): 74

## 2010-06-03 NOTE — Assessment & Plan Note (Signed)
Summary: eph/Dx:post cath/per pt call/lg  Medications Added CARVEDILOL 12.5 MG TABS (CARVEDILOL) one twice a day ISOSORBIDE MONONITRATE CR 60 MG XR24H-TAB (ISOSORBIDE MONONITRATE) one every evening      Allergies Added:   Visit Type:  Follow-up Primary Provider:  Dr Leward Quan  CC:  none.  History of Present Illness: 50 year-old gentleman presenting for hospital follow-up after recent hospitalization for acute systolic heart failure. He has a long history of untreated malignant HTN and presented with CHF symptoms in the setting of severe HTN. An echo showed that his LVEF was less than 20% with global LV dysfunction and 4-chamber dilatation. Cardiac cath was not pursued because of significant renal dysfunction, presumably secondary to his HTN.  The patient is doing much better. He has used illicit drugs periodically in the past and has completely discontinued this. He does not drink alcohol and has been following a much stricter diet than in the past. He denies chest pain, dyspnea, edema, orthopnea, or PND. He reports very high BP's in the morning but much better control in the afternoon/evening hours (usually [id number] systolic).  Current Medications (verified): 1)  Carvedilol 6.25 Mg Tabs (Carvedilol) .... Take One Tablet By Mouth Twice A Day 2)  Ferrous Sulfate 325 (65 Fe) Mg Tabs (Ferrous Sulfate) .... Take 1 Tablet By Mouth Two Times A Day 3)  Hydralazine Hcl 25 Mg Tabs (Hydralazine Hcl) .... Take 1 Tablet By Mouth Three Times A Day 4)  Isosorbide Mononitrate Cr 30 Mg Xr24h-Tab (Isosorbide Mononitrate) .... Take One Tablet By Mouth Daily 5)  Lasix 40 Mg Tabs (Furosemide) .... Take 1 Tablet By Mouth Two Times A Day 6)  Potassium Chloride Crys Cr 20 Meq Cr-Tabs (Potassium Chloride Crys Cr) .... Take One Tablet By Mouth Twice A Day 7)  Albuterol Sulfate (2.5 Mg/28ml) 0.083% Nebu (Albuterol Sulfate) .... As Needed 8)  Aspirin 81 Mg Tbec (Aspirin) .... Take One Tablet By Mouth Daily 9)   Trazodone Hcl 50 Mg Tabs (Trazodone Hcl) .... As Needed 10)  Xanax 2 Mg Tabs (Alprazolam) .... Take 1 Tablet By Mouth Four Times A Day  Allergies (verified): 1)  ! Codeine 2)  ! Vicodin  Past History:  Past medical history reviewed for relevance to current acute and chronic problems.  Past Medical History: Reviewed history from 12/16/2009 and no changes required.  1. Acute decompensation of chronic systolic congestive heart failure.   2. Severe uncontrolled hypertension/hypertensive urgency.   3. History of cocaine use.   4. 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   5. Bronchial asthma.   6. Benzodiazepine dependency.   7. History of anxiety/depression.   8. Chronic kidney disease stage 3-4.   9. Microcytic anemia.   Review of Systems       Negative except as per HPI   Vital Signs:  Patient profile:   50 year old male Height:      74 inches Weight:      273 pounds BMI:     35.18 Pulse rate:   74 / minute Resp:     18 per minute BP sitting:   194 / 122  (left arm)  Vitals Entered By: Levora Angel, CNA (December 18, 2009 11:55 AM)  Physical Exam  General:  Pt is alert and oriented, in no acute distress. HEENT: normal Neck: normal carotid upstrokes without bruits, JVP normal Lungs: CTA CV: RRR without murmur or gallop Abd: soft, NT, positive BS, no bruit, no organomegaly Ext: no clubbing, cyanosis, or edema. peripheral pulses 2+ and  equal Skin: warm and dry without rash    Echocardiogram  Procedure date:  10/04/2009  Findings:      Study Conclusions    - Left ventricle: The cavity size was moderately dilated. Wall     thickness was increased in a pattern of mild LVH. Systolic     function was severely reduced. The estimated ejection fraction was     in the range of 10% to 15%. Diffuse hypokinesis. Features are     consistent with a pseudonormal left ventricular filling pattern,     with concomitant abnormal relaxation and increased filling     pressure  (grade 2 diastolic dysfunction).   - Mitral valve: Mild to moderate regurgitation.   - Left atrium: The atrium was moderately dilated.   - Right ventricle: The cavity size was mildly dilated. Systolic     function was reduced.   - Right atrium: The atrium was moderately dilated.   - Tricuspid valve: Mild regurgitation.   - Pulmonary arteries: PA peak pressure: 34mm Hg (S).   - Pericardium, extracardiac: A trivial pericardial effusion was     identified.  EKG  Procedure date:  12/18/2009  Findings:      NSR 74 bpm, T wave abnormality consider lateral ischemia.  Impression & Recommendations:  Problem # 1:  HYPERTENSION, HEART CONTROLLED W/ CHF (ICD-402.11) BP remains markedly elevated. Recommend double carvedilol dose to 12.5 mg two times a day and increase imdur to 60 mg daily with a change in timing to now take at bedtime. Hopefully this will help reduce am blood pressures. Will follow-up with a bmet today to recheck his renal function.  His updated medication list for this problem includes:    Carvedilol 12.5 Mg Tabs (Carvedilol) ..... One twice a day    Hydralazine Hcl 25 Mg Tabs (Hydralazine hcl) .Marland Kitchen... Take 1 tablet by mouth three times a day    Lasix 40 Mg Tabs (Furosemide) .Marland Kitchen... Take 1 tablet by mouth two times a day    Aspirin 81 Mg Tbec (Aspirin) .Marland Kitchen... Take one tablet by mouth daily  Problem # 2:  CHRONIC SYSTOLIC HEART FAILURE (123456) Appears euvolemic, main issue is going to be control of BP. Plan reassess LV function with repeat echo in approximately 4 months. Also will check myoview to rule out ischemic etiology of cardiomyopathy, but want to get BP under control first. I doubt ischemia as LV dysfunction in global and pt is without angina.  His updated medication list for this problem includes:    Carvedilol 12.5 Mg Tabs (Carvedilol) ..... One twice a day    Isosorbide Mononitrate Cr 60 Mg Xr24h-tab (Isosorbide mononitrate) ..... One every evening    Lasix 40 Mg  Tabs (Furosemide) .Marland Kitchen... Take 1 tablet by mouth two times a day    Aspirin 81 Mg Tbec (Aspirin) .Marland Kitchen... Take one tablet by mouth daily  Orders: TLB-BMP (Basic Metabolic Panel-BMET) (99991111) TLB-Hepatic/Liver Function Pnl (80076-HEPATIC) TLB-Lipid Panel (80061-LIPID) TLB-CBC Platelet - w/Differential (85025-CBCD)  Other Orders: EKG w/ Interpretation (93000)  Patient Instructions: 1)  Your physician recommends that you schedule a follow-up appointment in: 4 weeks with Dr Burt Knack 2)  Your physician recommends that you have  lab work today,  lipid,liver,cbc, bmp  401.9  428.22 3)  Your physician has recommended you make the following change in your medication: increase carvedilol to 12.5 mg one twice a day and isosorbide mn to 60 mg at bedtime Prescriptions: CARVEDILOL 12.5 MG TABS (CARVEDILOL) one twice a day  #  60 x 6   Entered by:   Sim Boast, RN   Authorized by:   Neale Burly, MD   Signed by:   Sim Boast, RN on 12/18/2009   Method used:   Faxed to ...       [facility] (retail)       [place], [place]  [address]       Ph: [phone] [place]       Fax: [phone]   RxID:   [id number] ISOSORBIDE MONONITRATE CR 60 MG XR24H-TAB (ISOSORBIDE MONONITRATE) one every evening  #30 x 6   Entered by:   Sim Boast, RN   Authorized by:   Neale Burly, MD   Signed by:   Sim Boast, RN on 12/18/2009   Method used:   Faxed to ...       [facility] (retail)       [address] [place] [address], [place]  [address]       Ph: [phone]       Fax: [phone]   RxID:   [id number]

## 2010-06-03 NOTE — Consult Note (Signed)
Summary: Consultation Report  Consultation Report   Imported By: Ranell Patrick 11/03/2009 13:31:57  _____________________________________________________________________  External Attachment:    Type:   Image     Comment:   External Document

## 2010-06-28 ENCOUNTER — Encounter (INDEPENDENT_AMBULATORY_CARE_PROVIDER_SITE_OTHER): Payer: Self-pay | Admitting: *Deleted

## 2010-06-28 LAB — CONVERTED CEMR LAB
ALT: 46 units/L (ref 0–53)
Alkaline Phosphatase: 72 units/L (ref 39–117)
Creatinine, Ser: 1.55 mg/dL — ABNORMAL HIGH (ref 0.40–1.50)
Glucose, Bld: 94 mg/dL (ref 70–99)
LDL Cholesterol: 82 mg/dL (ref 0–99)
Sodium: 141 meq/L (ref 135–145)
Total Bilirubin: 0.8 mg/dL (ref 0.3–1.2)
Total CHOL/HDL Ratio: 3.5
Total Protein: 7.6 g/dL (ref 6.0–8.3)
Triglycerides: 70 mg/dL (ref <150)
VLDL: 14 mg/dL (ref 0–40)

## 2010-07-13 ENCOUNTER — Encounter: Payer: Self-pay | Admitting: Cardiovascular Disease

## 2010-07-13 ENCOUNTER — Ambulatory Visit (INDEPENDENT_AMBULATORY_CARE_PROVIDER_SITE_OTHER): Payer: Self-pay | Admitting: Cardiovascular Disease

## 2010-07-13 DIAGNOSIS — I2510 Atherosclerotic heart disease of native coronary artery without angina pectoris: Secondary | ICD-10-CM

## 2010-07-13 DIAGNOSIS — I5022 Chronic systolic (congestive) heart failure: Secondary | ICD-10-CM

## 2010-07-13 DIAGNOSIS — E785 Hyperlipidemia, unspecified: Secondary | ICD-10-CM

## 2010-07-13 DIAGNOSIS — I110 Hypertensive heart disease with heart failure: Secondary | ICD-10-CM

## 2010-07-13 LAB — URINALYSIS, ROUTINE W REFLEX MICROSCOPIC
Glucose, UA: NEGATIVE mg/dL
Hgb urine dipstick: NEGATIVE
Ketones, ur: NEGATIVE mg/dL
Protein, ur: NEGATIVE mg/dL
Urobilinogen, UA: 0.2 mg/dL (ref 0.0–1.0)

## 2010-07-13 NOTE — Assessment & Plan Note (Signed)
Malignant hypertension continues to be the patient's main problem. I have asked him to restart carvedilol 25 mg twice daily and to increase his amlodipine to 10 mg daily. He will continue his other medications without changes. He was advised on the importance of continuing to followup with [facility] as that is how he obtains his medications.

## 2010-07-13 NOTE — Assessment & Plan Note (Signed)
The patient is stable without angina. Will continue his current medical program. He remains on antiplatelet therapy with aspirin 81 mg daily.

## 2010-07-13 NOTE — Assessment & Plan Note (Signed)
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 He has been evaluated by Dr. Lovena Le and they discussed the indication for an ICD. He would like to see Dr. Lovena Le back in followup to schedule ICD implantation.

## 2010-07-13 NOTE — Progress Notes (Signed)
Subjective:      Patient ID: Cody Ortega is a 50 y.o. male.  Chief Complaint: Anxiety     This is a 50 year old gentleman with severe nonischemic cardiomyopathy and malignant hypertension presenting for followup evaluation.  The patient underwent cardiac catheterization last year demonstrating moderate right coronary artery stenosis and severe diagonal branch stenosis. His LV dysfunction was thought to be well out of proportion to his coronary disease and the patient does not have angina. He therefore was treated medically for his CAD. Since his last visit he has been evaluated by Dr. Lovena Le for consideration of an ICD.  He tells me he's been very uptight and stressed out today. He continues to have difficulty obtaining medications through [facility] and refractory hypertension continues to be his biggest problem. Most recently he ran out of meds for about 2 weeks. His BP readings were improved when he was taking his meds regularly. His BP pattern has consistently shown severely elevated BP's in the mornings and have been fairly well-controlled later in the day.  He denies chest pain or pressure. No shortness of breath or leg swelling. He reports episodes of lightheadedness but denies syncope or near-syncope.   ROS General: no fevers/chills/night sweats Eyes: no blurry vision, diplopia, or amaurosis ENT: no sore throat or hearing loss Resp: no cough, wheezing, or hemoptysis CV: no edema or palpitations GI: no abdominal pain, nausea, vomiting, diarrhea, or constipation GU: no dysuria, frequency, or hematuria Skin: no rash Neuro: no headache, numbness, tingling, or weakness of extremities Musculoskeletal: no joint pain or swelling Heme: no bleeding, DVT, or easy bruising Endo: no polydipsia or polyuria      Objective:    Physical Exam Pt is alert and oriented, anxious man in NAD HEENT: normal Neck: JVP - normal, carotids 2+= without bruits Lungs: CTA bilaterally CV: RRR  without murmur or gallop Abd: soft, NT, Positive BS, no hepatomegaly Ext: no C/C/E, distal pulses intact and equal Skin: warm/dry no rash    Allergies  Allergen Reactions  . Codeine   . Hydrocodone-Acetaminophen     Outpatient Encounter Prescriptions as of 07/13/2010  Medication Sig Dispense Refill  . albuterol (VENTOLIN HFA) 108 (90 BASE) MCG/ACT inhaler Inhale 2 puffs into the lungs. 2 puffs as needed       . alprazolam (XANAX) 2 MG tablet Take 2 mg by mouth. Take one tablet by mouth four times a day       . amLODipine (NORVASC) 5 MG tablet Take 5 mg by mouth daily.        Marland Kitchen aspirin 81 MG EC tablet Take 81 mg by mouth daily.        . ferrous sulfate 325 (65 FE) MG tablet Take 325 mg by mouth. Take one tablet by mouth two times a day       . furosemide (LASIX) 40 MG tablet Take 40 mg by mouth 2 (two) times daily.        . hydrALAZINE (APRESOLINE) 50 MG tablet Take 50 mg by mouth 3 (three) times daily.        . isosorbide mononitrate (IMDUR) 30 MG 24 hr tablet Take 30 mg by mouth. Take 2 tablets by mouth daily       . potassium chloride SA (K-DUR,KLOR-CON) 20 MEQ tablet Take 20 mEq by mouth 2 (two) times daily.        . pravastatin (PRAVACHOL) 40 MG tablet Take 40 mg by mouth daily.        Marland Kitchen  traZODone (DESYREL) 50 MG tablet Take 50 mg by mouth as needed.        . carvedilol (COREG) 25 MG tablet Take 25 mg by mouth. Take 2 tablets by mouth twice a day         Past Medical History  Diagnosis Date  . Chronic systolic congestive heart failure     Acute decompensation, LVEF less than 20%  . Hypertension   . History of cocaine abuse   . Bronchial asthma   . Benzodiazepine dependence   . Anxiety   . Depression   . Microcytic anemia   . Chronic kidney disease     Stage 3-4  . Cardiomyopathy     possible cocaine induced    Past Surgical History  Procedure Date  . Tonsillectomy     BP 180/120  Pulse 82  Resp 20    Lab Review:  EKG shows normal sinus rhythm with heart  rate 82 beats per minute, left atrial enlargement, T wave abnormality consider lateral ischemia.    Assessment:     No diagnosis found.   Plan:

## 2010-07-13 NOTE — Assessment & Plan Note (Signed)
Recent lipids reviewed and showed a total cholesterol of 135, HDL of 39, LDL of 82, and triglycerides of 70. Will continue pravastatin.

## 2010-07-19 ENCOUNTER — Emergency Department (HOSPITAL_COMMUNITY)
Admission: EM | Admit: 2010-07-19 | Discharge: 2010-07-19 | Disposition: A | Discharge status: Home / Self Care | Payer: No Typology Code available for payment source | Attending: Emergency Medicine | Admitting: Emergency Medicine

## 2010-07-19 ENCOUNTER — Emergency Department (HOSPITAL_COMMUNITY): Payer: No Typology Code available for payment source

## 2010-07-19 DIAGNOSIS — S139XXA Sprain of joints and ligaments of unspecified parts of neck, initial encounter: Secondary | ICD-10-CM | POA: Insufficient documentation

## 2010-07-19 DIAGNOSIS — R51 Headache: Secondary | ICD-10-CM | POA: Insufficient documentation

## 2010-07-19 DIAGNOSIS — Y92410 Unspecified street and highway as the place of occurrence of the external cause: Secondary | ICD-10-CM | POA: Insufficient documentation

## 2010-07-19 DIAGNOSIS — M542 Cervicalgia: Secondary | ICD-10-CM | POA: Insufficient documentation

## 2010-07-19 DIAGNOSIS — G9389 Other specified disorders of brain: Secondary | ICD-10-CM | POA: Insufficient documentation

## 2010-07-19 DIAGNOSIS — I10 Essential (primary) hypertension: Secondary | ICD-10-CM | POA: Insufficient documentation

## 2010-07-19 LAB — COMPREHENSIVE METABOLIC PANEL
AST: 29 U/L (ref 0–37)
AST: 30 U/L (ref 0–37)
AST: 30 U/L (ref 0–37)
Albumin: 2.9 g/dL — ABNORMAL LOW (ref 3.5–5.2)
Albumin: 3.1 g/dL — ABNORMAL LOW (ref 3.5–5.2)
Albumin: 3.3 g/dL — ABNORMAL LOW (ref 3.5–5.2)
Alkaline Phosphatase: 53 U/L (ref 39–117)
BUN: 37 mg/dL — ABNORMAL HIGH (ref 6–23)
BUN: 45 mg/dL — ABNORMAL HIGH (ref 6–23)
CO2: 28 mEq/L (ref 19–32)
Calcium: 8.6 mg/dL (ref 8.4–10.5)
Calcium: 8.7 mg/dL (ref 8.4–10.5)
Chloride: 106 mEq/L (ref 96–112)
Chloride: 106 mEq/L (ref 96–112)
Creatinine, Ser: 2.4 mg/dL — ABNORMAL HIGH (ref 0.4–1.5)
Creatinine, Ser: 2.4 mg/dL — ABNORMAL HIGH (ref 0.4–1.5)
Creatinine, Ser: 2.84 mg/dL — ABNORMAL HIGH (ref 0.4–1.5)
GFR calc Af Amer: 29 mL/min — ABNORMAL LOW (ref >60)
GFR calc Af Amer: 32 mL/min — ABNORMAL LOW (ref >60)
GFR calc Af Amer: 35 mL/min — ABNORMAL LOW (ref >60)
GFR calc non Af Amer: 29 mL/min — ABNORMAL LOW (ref >60)
Glucose, Bld: 86 mg/dL (ref 70–99)
Potassium: 3.3 mEq/L — ABNORMAL LOW (ref 3.5–5.1)
Potassium: 3.4 mEq/L — ABNORMAL LOW (ref 3.5–5.1)
Sodium: 138 mEq/L (ref 135–145)
Total Bilirubin: 0.9 mg/dL (ref 0.3–1.2)
Total Bilirubin: 1 mg/dL (ref 0.3–1.2)
Total Protein: 6.2 g/dL (ref 6.0–8.3)
Total Protein: 6.2 g/dL (ref 6.0–8.3)
Total Protein: 6.5 g/dL (ref 6.0–8.3)

## 2010-07-19 LAB — URINALYSIS, ROUTINE W REFLEX MICROSCOPIC
Bilirubin Urine: NEGATIVE
Hgb urine dipstick: NEGATIVE
Specific Gravity, Urine: 1.021 (ref 1.005–1.030)
Urobilinogen, UA: 0.2 mg/dL (ref 0.0–1.0)

## 2010-07-19 LAB — CBC
HCT: 32.2 % — ABNORMAL LOW (ref 39.0–52.0)
HCT: 32.3 % — ABNORMAL LOW (ref 39.0–52.0)
Hemoglobin: 10 g/dL — ABNORMAL LOW (ref 13.0–17.0)
MCHC: 31 g/dL (ref 30.0–36.0)
MCHC: 31.4 g/dL (ref 30.0–36.0)
MCV: 74 fL — ABNORMAL LOW (ref 78.0–100.0)
MCV: 74.8 fL — ABNORMAL LOW (ref 78.0–100.0)
MCV: 74.8 fL — ABNORMAL LOW (ref 78.0–100.0)
Platelets: 235 10*3/uL (ref 150–400)
Platelets: 248 10*3/uL (ref 150–400)
RDW: 17.8 % — ABNORMAL HIGH (ref 11.5–15.5)
RDW: 17.9 % — ABNORMAL HIGH (ref 11.5–15.5)
WBC: 7.8 10*3/uL (ref 4.0–10.5)
WBC: 8 10*3/uL (ref 4.0–10.5)

## 2010-07-19 LAB — BASIC METABOLIC PANEL
CO2: 28 mEq/L (ref 19–32)
CO2: 30 mEq/L (ref 19–32)
Calcium: 9 mg/dL (ref 8.4–10.5)
Chloride: 104 mEq/L (ref 96–112)
Chloride: 106 mEq/L (ref 96–112)
GFR calc Af Amer: 31 mL/min — ABNORMAL LOW (ref >60)
GFR calc Af Amer: 33 mL/min — ABNORMAL LOW (ref >60)
Potassium: 3.7 mEq/L (ref 3.5–5.1)
Sodium: 141 mEq/L (ref 135–145)
Sodium: 141 mEq/L (ref 135–145)

## 2010-07-19 LAB — BRAIN NATRIURETIC PEPTIDE
Pro B Natriuretic peptide (BNP): 1170 pg/mL — ABNORMAL HIGH (ref 0.0–100.0)
Pro B Natriuretic peptide (BNP): 1380 pg/mL — ABNORMAL HIGH (ref 0.0–100.0)
Pro B Natriuretic peptide (BNP): 869 pg/mL — ABNORMAL HIGH (ref 0.0–100.0)

## 2010-07-19 LAB — URINE MICROSCOPIC-ADD ON

## 2010-07-19 LAB — FERRITIN: Ferritin: 75 ng/mL (ref 22–322)

## 2010-07-19 LAB — POCT CARDIAC MARKERS: CKMB, poc: 2.5 ng/mL (ref 1.0–8.0)

## 2010-07-19 LAB — DIFFERENTIAL
Basophils Absolute: 0 10*3/uL (ref 0.0–0.1)
Eosinophils Relative: 2 % (ref 0–5)
Lymphocytes Relative: 16 % (ref 12–46)
Lymphs Abs: 1.3 10*3/uL (ref 0.7–4.0)
Monocytes Absolute: 0.6 10*3/uL (ref 0.1–1.0)
Neutro Abs: 5.9 10*3/uL (ref 1.7–7.7)

## 2010-07-19 LAB — IRON AND TIBC
Iron: 23 ug/dL — ABNORMAL LOW (ref 42–135)
TIBC: 428 ug/dL (ref 215–435)

## 2010-07-19 LAB — CARDIAC PANEL(CRET KIN+CKTOT+MB+TROPI)
CK, MB: 3.8 ng/mL (ref 0.3–4.0)
CK, MB: 4 ng/mL (ref 0.3–4.0)
Relative Index: INVALID (ref 0.0–2.5)
Relative Index: INVALID (ref 0.0–2.5)
Total CK: 59 U/L (ref 7–232)
Total CK: 70 U/L (ref 7–232)
Troponin I: 0.09 ng/mL — ABNORMAL HIGH (ref 0.00–0.06)

## 2010-07-19 LAB — RETICULOCYTES: Retic Count, Absolute: 66.9 10*3/uL (ref 19.0–186.0)

## 2010-07-20 NOTE — Assessment & Plan Note (Signed)
Summary: [place]   Visit Type:  Follow-up Primary Provider:  Dr Leward Quan  CC:  anxiety.  History of Present Illness: NOTE DONE IN EPIC.  Current Medications (verified): 1)  Ferrous Sulfate 325 (65 Fe) Mg Tabs (Ferrous Sulfate) .... Take 1 Tablet By Mouth Two Times A Day 2)  Hydralazine Hcl 50 Mg Tabs (Hydralazine Hcl) .... Take One Tablet By Mouth Three Times A Day 3)  Isosorbide Mononitrate Cr 30 Mg Xr24h-Tab (Isosorbide Mononitrate) .... Take  2 Tablet By Mouth Daily 4)  Lasix 40 Mg Tabs (Furosemide) .... Take 1 Tablet By Mouth Two Times A Day 5)  Potassium Chloride Crys Cr 20 Meq Cr-Tabs (Potassium Chloride Crys Cr) .... Take One Tablet By Mouth Twice A Day 6)  Ventolin Hfa 108 (90 Base) Mcg/act Aers (Albuterol Sulfate) .... 2 Puffs As Needed 7)  Aspirin 81 Mg Tbec (Aspirin) .... Take One Tablet By Mouth Daily 8)  Trazodone Hcl 50 Mg Tabs (Trazodone Hcl) .... As Needed 9)  Xanax 2 Mg Tabs (Alprazolam) .... Take 1 Tablet By Mouth Four Times A Day 10)  Pravastatin Sodium 40 Mg Tabs (Pravastatin Sodium) .... Take One Tablet By Mouth Daily At Bedtime 11)  Amlodipine Besylate 5 Mg Tabs (Amlodipine Besylate) .... Take One Tablet By Mouth Daily  Allergies: 1)  ! Codeine 2)  ! Vicodin  Past History:  Past Medical History: Last updated: 01/11/2010  1. Acute decompensation of chronic systolic congestive heart failure, LVEF less than 20%.   2. Severe uncontrolled hypertension/hypertensive urgency.   3. History of cocaine use.   4. 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   5. Bronchial asthma.   6. Benzodiazepine dependency.   7. History of anxiety/depression.   8. Chronic kidney disease stage 3-4.   9. Microcytic anemia.   Vital Signs:  Patient profile:   50 year old male Height:      74 inches Weight:      296.25 pounds BMI:     38.17 Pulse rate:   85 / minute Pulse rhythm:   regular Resp:     18 per minute BP sitting:   180 / 120  (left arm) Cuff size:   large  Vitals  Entered By: Sidney Ace (July 13, 2010 4:19 PM)   Other Orders: EKG w/ Interpretation (93000)  Patient Instructions: 1)  Your physician recommends that you schedule a follow-up appointment in: 3 MONTHS with Dr Burt Knack, Office Visit with Dr Lovena Le to discuss ICD 2)  Your physician has recommended you make the following change in your medication: INCREASE Amlodipine to 10mg  one by mouth daily, START Carvedilol 25mg  two times a day  Prescriptions: CARVEDILOL 25 MG TABS (CARVEDILOL) Take one tablet by mouth twice a day  #60 x 11   Entered by:   Theodosia Quay, RN, BSN   Authorized by:   Neale Burly, MD   Signed by:   Theodosia Quay, RN, BSN on 07/13/2010   Method used:   Print then Give to Patient   RxID:   [id number] AMLODIPINE BESYLATE 10 MG TABS (AMLODIPINE BESYLATE) Take one tablet by mouth daily  #30 x 11   Entered by:   Theodosia Quay, RN, BSN   Authorized by:   Neale Burly, MD   Signed by:   Theodosia Quay, RN, BSN on 07/13/2010   Method used:   Print then Give to Patient   RxID:   [id number]

## 2010-07-20 NOTE — Progress Notes (Signed)
Summary: [facility] Office Note  [facility] Office Note   Imported By: Marilynne Drivers 07/14/2010 13:44:56  _____________________________________________________________________  External Attachment:    Type:   Image     Comment:   External Document

## 2010-08-20 ENCOUNTER — Ambulatory Visit: Payer: Self-pay | Admitting: Internal Medicine

## 2010-08-25 ENCOUNTER — Encounter: Payer: Self-pay | Admitting: Internal Medicine

## 2010-08-25 ENCOUNTER — Ambulatory Visit (INDEPENDENT_AMBULATORY_CARE_PROVIDER_SITE_OTHER): Payer: Self-pay | Admitting: Internal Medicine

## 2010-08-25 DIAGNOSIS — I5022 Chronic systolic (congestive) heart failure: Secondary | ICD-10-CM

## 2010-08-25 DIAGNOSIS — I10 Essential (primary) hypertension: Secondary | ICD-10-CM

## 2010-08-25 NOTE — Patient Instructions (Signed)
Your physician wants you to follow-up in: June with Dr Knox Saliva will receive a reminder letter in the mail two months in advance. If you don't receive a letter, please call our office to schedule the follow-up appointment.

## 2010-08-25 NOTE — Progress Notes (Signed)
HPI   [name] returns today for followup. He is a 50 year old man with an ischemic cardiomyopathy and congestive heart failure. He has an ejection fraction of 25% despite maximal medical therapy with beta blockers, nitrates and hydralazine. The patient was seen by me several months ago and a prophylactic defibrillator was recommended. At that time the patient was quite anxious and preferred not to have his device placed. Since then he has reconsidered. He has not had syncope. He notes that he does have increasing stress in his life. His heart failure is class II. He has not had any anginal symptoms. Allergies  Allergen Reactions  . Codeine   . Hydrocodone-Acetaminophen      Current Outpatient Prescriptions  Medication Sig Dispense Refill  . albuterol (VENTOLIN HFA) 108 (90 BASE) MCG/ACT inhaler Inhale 2 puffs into the lungs. 2 puffs as needed       . alprazolam (XANAX) 2 MG tablet Take 2 mg by mouth. Take one tablet by mouth four times a day       . amLODipine (NORVASC) 5 MG tablet Take 5 mg by mouth daily.        Marland Kitchen aspirin 81 MG EC tablet Take 81 mg by mouth daily.        . carvedilol (COREG) 25 MG tablet Take 25 mg by mouth. Take 2 tablets by mouth twice a day       . ferrous sulfate 325 (65 FE) MG tablet Take 325 mg by mouth. Take one tablet by mouth two times a day       . furosemide (LASIX) 40 MG tablet Take 40 mg by mouth 2 (two) times daily.        . hydrALAZINE (APRESOLINE) 50 MG tablet Take 50 mg by mouth 3 (three) times daily.        . isosorbide mononitrate (IMDUR) 30 MG 24 hr tablet Take 30 mg by mouth. Take 2 tablets by mouth daily       . potassium chloride SA (K-DUR,KLOR-CON) 20 MEQ tablet Take 20 mEq by mouth 2 (two) times daily.        . pravastatin (PRAVACHOL) 40 MG tablet Take 40 mg by mouth daily.        . traZODone (DESYREL) 50 MG tablet Take 50 mg by mouth as needed.           Past Medical History  Diagnosis Date  . Chronic systolic congestive heart failure    Acute decompensation, LVEF less than 20%  . Hypertension   . History of cocaine abuse   . Bronchial asthma   . Benzodiazepine dependence   . Anxiety   . Depression   . Microcytic anemia   . Chronic kidney disease     Stage 3-4  . Cardiomyopathy     possible cocaine induced    ROS:   All systems reviewed and negative except as noted in the HPI.   Past Surgical History  Procedure Date  . Tonsillectomy      Family History  Problem Relation Age of Onset  . Breast cancer Mother   . Multiple myeloma Mother   . Prostate cancer Father   . Diabetes Father   . Cerebral palsy Daughter      History   Social History  . Marital Status: Divorced    Spouse Name: N/A    Number of Children: N/A  . Years of Education: N/A   Occupational History  . Unemployed     basketball referee  in the past   Social History Main Topics  . Smoking status: Former Research scientist (life sciences)  . Smokeless tobacco: Not on file  . Alcohol Use: No  . Drug Use: Not on file     Reported history of cocaine abuse off and on  . Sexually Active: Not on file   Other Topics Concern  . Not on file   Social History Narrative  . No narrative on file     BP 165/98  Pulse 65  Ht 6\' 3"  (1.905 m)  Wt 294 lb 1.9 oz (133.412 kg)  BMI 36.76 kg/m2  Physical Exam:  Obese, well appearing NAD HEENT: Unremarkable Neck:  No JVD, no thyromegally Lymphatics:  No adenopathy Back:  No CVA tenderness Lungs:  Clear HEART:  Regular rate rhythm, no murmurs, no rubs, no clicks Abd:  Flat, positive bowel sounds, no organomegally, no rebound, no guarding Ext:  2 plus pulses, no edema, no cyanosis, no clubbing Skin:  No rashes no nodules Neuro:  CN II through XII intact, motor grossly intact  Assess/Plan:

## 2010-08-25 NOTE — Assessment & Plan Note (Signed)
His blood pressure remains elevated. He admits to some sodium indiscretion. I've asked that he maintain a low-salt diet. He will continue his current medications though he may require up titration. I will defer this to Dr. Burt Knack.

## 2010-08-25 NOTE — Assessment & Plan Note (Signed)
The patient's symptoms remain class II. He is on maximal medical therapy. I discussed the indications, risks, benefits, goals and expectations of prophylactic defibrillator insertion with the patient. He is considering his options. He will call us if he decides to proceed with ICD implantation or if he wishes additional followup.

## 2010-09-14 NOTE — H&P (Signed)
NAME:  RECHARD, STFORT NO.:  [id number]   MEDICAL RECORD NO.:  [id number]          PATIENT TYPE:  INP   LOCATION:  2020                         FACILITY:  [facility]   PHYSICIAN:  Carlena Bjornstad, MD, FACCDATE OF BIRTH:  29-Nov-1960   DATE OF ADMISSION:  11/09/2006  DATE OF DISCHARGE:                              HISTORY & PHYSICAL   Mr. Grayer is a 50 year old Caucasian gentleman, admitted under the care  of the [facility].  His primary care physician, would be the doctors  out at [facility].  Psychiatry, he is followed by a Randye Lobo at  [facility].   Mr. Rousselle presented to [facility] Emergency Room after being seen at  [facility] on day of admission with complaints of heart racing.  Apparently, this has been going on for 2 days.  Mr. Fazzina is a rather  tense, stressed gentleman at this time.  Apparently, he was down in  [place], on Friday, visiting his sick mother who has multiple myeloma.  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  Apparently, drove all night Friday night to get back to this area to  pick up his girlfriend Saturday morning, who was being released from  prison and then they picked up her newborn baby who she states was  fathered by someone else, so he was obviously under a lot of stress with  this.  He states he did not sleep at all Friday night because he was  driving, consumed a large amount of caffeine, picked up his girlfriend  they went to her mother's house to pick up their 89-year-old daughter who  is also not fathered by him.  There was evidently a lot of stress, a lot  of arguing, trying to get everything worked out and then they brought  both kids back to Mr. Bourdon' apartment here in [place] where he  resides and the family is going to live with him.  Saturday night, he  states he felt like his heart was  racing.  He checked his blood  pressure.  His blood pressure was 160/99.  According to his blood  pressure machine, his pulse was irregular.  He tried to rest.  Sunday,  he felt bad all day.  Blood pressure was still elevated, but he states  this blood pressure reading is normal for him and this is where he feels  his best.  Needless to say, he has been feeling these symptoms since  last weekend, but he states the heart racing has only been for the last  2 days, but then earlier he states it was this way last Saturday night;  however, today is Friday and it has been approximately 1 week since this  all began.  Somewhat difficult historian.  He also complains of some  cough and congestion.  He states he picked this up from his girlfriend  when they got together Saturday night.  He states he has a nonproductive  cough and he has been using  his inhaler more than he usually does.  He  denies any over-the-counter medications, other than his prescription  medications.   ALLERGIES:  INCLUDE:  1. VICODIN.  2. CODEINE.   MEDICATIONS:  At home, he is on:  1. Lasix 20 daily.  2. Norvasc 5.  3. Clonidine 0.2 b.i.d.  4. Xanax.  5. Aspirin 81.  6. Lexapro 10 mg.  7. Pravachol 40 mg.  8. Metoprolol 50 b.i.d.  9. Ventolin and albuterol inhalers p.r.n.   PAST MEDICAL HISTORY:  Includes:  1. Anxiety, depression.  2. Asthma.  3. Chronic obstructive pulmonary disease.  4. Hypertension.  5. Cerebrovascular accident in 2004 while in [place].  6. Dyslipidemia.  7. Previous history of cocaine use.  Currently using marijuana.  8. Status post tonsillectomy.   SOCIAL HISTORY:  Lives in [place] with his girlfriend and her newborn  and 8-year-old daughter, both are just getting out of prison.  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  Denies any tobacco use.  He  states he walks for exercise.  Denies any alcohol use.   No diet  restrictions.  He adamantly denies any crack/cocaine use.  He does state  he still uses marijuana.   FAMILY HISTORY:  Mother alive with multiple myeloma and bone cancer.  Father diabetes and alcoholism.  No siblings.   REVIEW OF SYSTEMS:  Positive for sweats, heart racing, palpitations,  cough, wheezing, weakness, depression, anxiety, occasional diarrhea and  GERD symptoms.   PHYSICAL EXAMINATION:  VITAL SIGNS:  Temp 97.1.  Heart rate 138-150.  Respirations 19-22.  Blood pressure 136/92.  Patient is saturating 96%  on 2 liters.  GENERALLY:  He is in no acute distress.  He is somewhat restless,  agitated and tense.  HEENT:  Mucous membrane is moist.  Sclerae is clear.  Normocephalic,  atraumatic.  Pupils equal, round and reactive to light.  No bruits.  No  JVD.  CARDIOVASCULAR EXAM:  S1, S2 irregular, irregular, tachycardic.  LUNGS:  Breath sounds are distant.  He has some scattered rhonchi.  I do  not hear any wheezing at this time.  No crackles.  SKIN:  Warm, diaphoretic.  ABDOMEN:  Soft, nontender.  Positive bowel sounds.  LOWER EXTREMITIES:  Without clubbing, cyanosis or edema.  NEUROLOGICALLY:  He is alert and oriented x3.   Chest x-ray showing cardiomegaly with vascular congestion.  No acute CHF  noted.  A 12-lead EKG done on July 10 shows atrial fibrillation with  rapid ventricular response at a rate of 158.  EKG, from [facility] also  in chart, showing atrial fibrillation at a rate of 162.   LABORATORY DATA:  H&H 15.0 and 44.2.  WBC 9.4, platelets 215,000.  Sodium 139, potassium 3.3, chloride 103, CO2 29, BUN 23, creatinine 1.7  and glucose 94.  Note, BMET was drawn on November 09, 2006.  Cardiac  enzymes:  First point of care markers negative.  First set of cardiac  enzymes, troponin 0.10.  Second set 0.02.  CK-MB 2.2 and 2.3.  TSH,  within normal limits, 2.714.  Drug screen, this admission, Deloss Rathel, performed July 10, at 2211, positive for  benzodiazepines,  positive for cocaine and positive for THC.  Patient adamantly denies any  cocaine use.   IMPRESSION:  1. Atrial fibrillation with rapid ventricular response.  2. Positive drug screen for cocaine, benzos and THC.  3. Mildly elevated troponin.  4. Hypokalemia.  5. Hypertension.   Patient currently on Cardizem at 20.  Digoxin, he has received 0.25 mg  IV x1 dose this morning.  He was also started on his metoprolol 50 mg  p.o. b.i.d., has had 1 dose of this.   RECOMMENDATIONS:  Continue current medications.  Patient needs to be  loaded with a higher dose of digoxin, we cancelled previous order.  Give  him 0.25 mg IV now, repeat again at 6 p.m. and again at 10 p.m. tonight,  then start digoxin 0.25 mg p.o. daily tomorrow.  We will repeat BMET now  with third set of cardiac enzymes and proceed with 2D echocardiogram.  Patient is not a great candidate for Coumadin.  Cardioversion may be  needed, but ultimately would be best to anticoagulate patient after  this.  We will need a 2D echocardiogram.  For now, we will control his  rate.  Plan on cardioversion if rate is unable to be stabilized.  Dr.  Daryel November into examine and assess patient and agrees with plan.      Rosanne Sack, ACNP      Carlena Bjornstad, MD, [facility]  Electronically Signed    MB/MEDQ  D:  11/10/2006  T:  11/11/2006  Job:  [id number]   cc:   Stark Jock, M.D.  [facility]

## 2010-09-14 NOTE — H&P (Signed)
Cody Ortega, Cody Ortega              ACCOUNT NO.:  [id number]   MEDICAL RECORD NO.:  [id number]          PATIENT TYPE:  INP   LOCATION:  2020                         FACILITY:  [facility]   PHYSICIAN:  Barbette Merino, M.D.      DATE OF BIRTH:  1961-04-14   DATE OF ADMISSION:  11/09/2006  DATE OF DISCHARGE:                              HISTORY & PHYSICAL   PRIMARY CARE PHYSICIAN:  [facility].   CHIEF COMPLAINT:  Palpitations.   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  For the past three days, however, he has  noticed increasing palpitations.  He denies any shortness of breath,  denies any chest pain.  The patient is very much stressed with his wife  just leaving prison.  She had a baby while in prison.  Also has a 14-  year-old son at home that has been giving him problems.  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  The patient  felt symptoms had to do with his basketball games so he came to the  emergency room.  In the ED, he was found to have a heart rate of more  than 160, but he has been so far stable.   PAST MEDICAL HISTORY:  His past medical history is significant for:  1. CVA four years ago.  2. Anxiety disorder with depression on and off.  3. Hypertension.  4. Dyslipidemia.  5. The patient also had some mild asthma/COPD.   ALLERGIES:  HE IS ALLERGIC TO CODEINE AND VICODIN.   SOCIAL HISTORY:  The patient is divorced, but has his girlfriend.  He  lives with some relatives.  He denies any tobacco, although he is around  heavy tobacco smokers.  Denies alcohol intake and no IV drug use.   FAMILY HISTORY:  Mother had multiple myeloma.  Also family history of  hypertension.  No diabetes.   REVIEW OF SYSTEMS:  Mainly some anxiety, otherwise a 12-point review of  systems is per HPI.   PHYSICAL EXAMINATION:  VITAL SIGNS:   Temperature of 99.0.  Blood  pressure 148/93.  Pulse of 152 to 165.  Respiratory rate 18.  Sats 98%  on room air.  GENERAL:  He is awake, alert and oriented, in no acute distress, but  very anxious and worried.  HEENT:  PERRLA.  EOMI.  Neck is supple, no JVD, no lymphadenopathy.  RESPIRATORY:  He has good air entry bilaterally.  No wheezes, no rales.  CARDIOVASCULAR SYSTEM:  He has irregularly irregular rhythm, very  tachycardic.  No audible murmurs.  ABDOMEN:  Full, soft, nontender with positive bowel sounds.  EXTREMITIES:  Showed no clubbing, cyanosis or edema.   LABORATORY DATA:  Sodium is 141, potassium 3.4, chloride 106, CO2 of 27,  BUN 26 with creatinine currently pending.  His EKG showed atrial  fibrillation with rapid ventricular response with a rate of 158, some ST  changes.   ASSESSMENT:  Therefore, this  is a 50 year old gentleman that is here  secondary to having new onset atrial fibrillation with rapid ventricular  response.   PLAN:  The plan therefore will be:  1. New onset atrial fibrillation:  We will admit the patient to tele      bed.  Start him on Cardizem drip and titrate it to control his      heart rate.  We will also get a 2D echo, check TSH, check a urine      drug screen and get cardiology consult in the morning.  2. Hypertension:  We will continue with the Cardizem.  If needed, we      will add other medications including the metoprolol that he is      taking at home.  I will keep him on the Lasix, however.  3. Hyperlipidemia:  We will check first a lipid panel and continue      with the Pravachol.  4. Hypokalemia:  I will replete his potassium level at this point.  5. Depression and anxiety:  I will continue with the Lexapro and      follow patient closely.  6. Dehydration:  I will check BMP.  At the same time, I will put him      on some fluid unless his BMP is very high.  7. Asthma/COPD:  I will keep him on nebulizers while in the hospital.       Further treatment will depend on initial patient's response.      Barbette Merino, M.D.  Electronically Signed     LG/MEDQ  D:  11/09/2006  T:  11/10/2006  Job:  [id number]

## 2010-09-14 NOTE — Discharge Summary (Signed)
NAMEBRETLEY, Cody Ortega              ACCOUNT NO.:  [id number]   MEDICAL RECORD NO.:  [id number]          PATIENT TYPE:  INP   LOCATION:  2020                         FACILITY:  [facility]   PHYSICIAN:  Cody Ortega, M.D.   DATE OF BIRTH:  16-Dec-1960   DATE OF ADMISSION:  11/09/2006  DATE OF DISCHARGE:                               DISCHARGE SUMMARY   DISCHARGE DIAGNOSES:  1. Atrial fibrillation with rapid ventricular response, new onset.  2. Anxiety disorder with depression, on and off.  3. Hypertension.  4. Dyslipidemia.  5. Mild asthma/ chronic obstructive pulmonary disease.  6. History of cerebrovascular accident 4 years ago.  7. Polysubstance abuse.  8. Chronic renal insufficiency.   DISCHARGE MEDICATIONS:  To be determined with aid of discharge.   FOLLOW UP:  The patient is to follow up with primary care physician, Dr.  Jonelle Sidle - [facility].   CONSULTANT:  Cardiology.   HISTORY OF PRESENT ILLNESS:  The patient is a 50 year old male that came  in with palpitations.  He denied shortness of breath, denied any chest  pain.  The patient was very stressed with his wife just leaving prison,  and she had a baby while she was in prison.  Also, he has a 43-year-old  son at home who has been giving him problems.   In the emergency department, he was noted to have a heart rate in the  160s, but he has been stable so far.   PAST MEDICAL HISTORY:   FAMILY HISTORY:   SOCIAL HISTORY:   MEDICATIONS:   ALLERGIES:   REVIEW OF SYSTEMS:  See admission history and physical.   PHYSICAL EXAMINATION:  (At the time of service).  VITAL SIGNS:  Temperature 97.4, pulse 72, respirations 20, blood  pressure 179/104.  Pulse oximetry 97% on room air.  HEENT:  Normocephalic, atraumatic.  Pupils are reactive to light.  Throat without erythema.  CARDIOVASCULAR:  Regular rate and rhythm.  LUNGS:  Clear bilaterally.  ABDOMEN:  Positive bowel sounds.  EXTREMITIES:  Without edema.   HOSPITAL  COURSE:  1. Atrial fibrillation with rapid ventricular response:  The patient      was admitted to 2000.  He was placed on telemetry monitor, and he      was placed on a Cardizem drip.  His blood pressure continued to be      elevated, so he was loaded with digoxin and started on digoxin.      His pulse started to improve.  He was also on a beta-blocker at the      time, but the beta-blocker was discontinued with his urine drug      screen being positive for cocaine and his history of cocaine abuse.      The beta-blocker was discontinued, he was continued on Cardizem,      digoxin, and started on Lotensin. He was to be cardioverted a few      days into his hospital admission.  He was on heparin.  He was going      to get a transesophageal echocardiography and DC cardioversion but  he went back into normal sinus rhythm at a normal rate.  His      discharge was held, however, because he continued to be      hypertensive.  He states that his blood pressure normally runs      high.  But with his having a history of stroke and being on      Coumadin, he was deemed not safe for discharge until his blood      pressure is better controlled.  2. Polysubstance abuse:  The patient is counseled against cocaine use      and against any substance use.  3. Hypertension:  As mentioned above, the patient was just started on      Cardizem increase, and he is on Lotensin to try to help with blood      pressure control.  4. Dyslipidemia:  He is on Zocor daily.  5. Anxiety:  He has been taking Lexapro for his anxiety.  6. Chronic renal insufficiency:  Kidney function remained stable      throughout his hospitalization, more likely kidney insufficiency      secondary to hypertension.   DISCHARGE LABORATORY DATA:  Sodium 141, potassium 3.9, chloride 103, CO2  31, glucose 110, BUN 16, creatinine 1.78.  PT 20.7, INR 1.7.  WBC 8.1,  hemoglobin 15.9, platelets 233.  MCV 87.7.  Chest x-ray:  Cardiomegaly   with vascular congestion, with 2D echocardiogram pending.   DISPOSITION:  Discharged to [facility] and Dr. Jonelle Sidle.      Cody Ortega, M.D.  Electronically Signed     NJ/MEDQ  D:  11/14/2006  T:  11/14/2006  Job:  [id number]

## 2010-09-14 NOTE — Discharge Summary (Signed)
Cody Ortega, Cody Ortega              ACCOUNT NO.:  [id number]   MEDICAL RECORD NO.:  [id number]          PATIENT TYPE:  INP   LOCATION:  2020                         FACILITY:  [facility]   PHYSICIAN:  Barbette Merino, M.D.      DATE OF BIRTH:  [birth year]   DATE OF ADMISSION:  11/09/2006  DATE OF DISCHARGE:  11/15/2006                               DISCHARGE SUMMARY   PRIMARY CARE PHYSICIAN:  [facility].   DISCHARGE DIAGNOSIS:  For discharge diagnosis, please refer to Dr.  Cherlyn Roberts discharge summary.  At this point, the patient gets ready for  discharge.   DISCHARGE MEDICATIONS:  1. Pravachol 40 mg daily.  2. Aspirin 81 mg daily.  3. Lexapro 10 mg daily.  4. Xanax 2 mg daily.  5. Digoxin 0.25 mg daily.  6. Senokot-S 1 tablet twice a day.  7. Cardizem 180 mg p.o. b.i.d.  8. Lotensin 40 mg daily.   DISPOSITION:  The patient will be discharged home to follow up at  [facility].  I have discussed with physician with [facility].  The  main issue now is anticoagulation.  The patient has been on Coumadin  here in the hospital, but after discussing with the cardiologist and the  fear  that the patient may not be compliant with treatment and his  previous stroke may be hemorrhagic in nature.  For that reason, the  patient is not being discharged on Coumadin.  The plan is that he will  be seen as a followup at [facility].  If he complies with follow-up,  then consideration for anticoagulation will be given, and the patient  may be restarted on anticoagulation.  At this point, the patient's AFib  has converted to normal sinus, and his heart rate has also been  controlled.  Blood pressure looks much better today.  Further treatment  will continue at [facility], under the current scenario.      Barbette Merino, M.D.  Electronically Signed     LG/MEDQ  D:  11/15/2006  T:  11/16/2006  Job:  [id number]

## 2010-10-01 ENCOUNTER — Encounter: Payer: Self-pay | Admitting: Cardiovascular Disease

## 2010-10-01 ENCOUNTER — Ambulatory Visit (INDEPENDENT_AMBULATORY_CARE_PROVIDER_SITE_OTHER): Payer: Self-pay | Admitting: Cardiovascular Disease

## 2010-10-01 ENCOUNTER — Encounter: Payer: Self-pay | Admitting: *Deleted

## 2010-10-01 DIAGNOSIS — I2510 Atherosclerotic heart disease of native coronary artery without angina pectoris: Secondary | ICD-10-CM

## 2010-10-01 DIAGNOSIS — I10 Essential (primary) hypertension: Secondary | ICD-10-CM

## 2010-10-01 DIAGNOSIS — I5022 Chronic systolic (congestive) heart failure: Secondary | ICD-10-CM

## 2010-10-01 NOTE — Progress Notes (Signed)
HPI:  This is a 50 year old gentleman with a severe nonischemic cardiomyopathy.  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 More recently the patient has had better control of his blood pressure. He continues to have some high readings in the mornings but the remainder of the day his blood pressure has been at goal for the most part. The patient has a lot of anxiety and his blood pressure spikes with stress. 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 I have elected to treat him medically since he has no angina and is cardiomyopathy is clearly nonischemic in nature as it is well out of proportion to the extent of coronary disease. The patient reports compliance with his medications. He denies chest pain or tightness. He does have shortness of breath at times and again relates this to anxiety. His shortness of breath resolves spontaneously. He denies edema, orthopnea, or PND. He denies lightheadedness or syncope. The patient has been evaluated by Dr. Lovena Le and he is planning on undergoing ICD implantation. He is scheduled to see Dr. Lovena Le back later this month in followup. He is going to have dental extraction performed under general anesthesia and requires cardiac clearance prior to this.  Outpatient Encounter Prescriptions as of 10/01/2010  Medication Sig Dispense Refill  . albuterol (VENTOLIN HFA) 108 (90 BASE) MCG/ACT inhaler Inhale 2 puffs into the lungs. 2 puffs as needed       . ALPRAZolam (XANAX XR) 1 MG 24 hr tablet Take 1 mg by mouth every morning.        Marland Kitchen alprazolam (XANAX) 2 MG tablet Take 2 mg by mouth. Take one tablet by mouth four times a day       . amLODipine (NORVASC) 5 MG tablet Take 5 mg by mouth daily.        Marland Kitchen amoxicillin (AMOXIL) 500 MG capsule Take 500 mg by mouth 3 (three) times daily.          Marland Kitchen aspirin 81 MG EC tablet Take 81 mg by mouth daily.        . carvedilol (COREG) 25 MG tablet Take 25 mg by mouth. Take 2 tablets by mouth twice a day       . ferrous sulfate 325 (65 FE) MG tablet Take 325 mg by mouth. Take one tablet by mouth two times a day       . furosemide (LASIX) 40 MG tablet Take 40 mg by mouth 2 (two) times daily.        . hydrALAZINE (APRESOLINE) 50 MG tablet Take 50 mg by mouth 3 (three) times daily.        . isosorbide mononitrate (IMDUR) 30 MG 24 hr tablet Take 30 mg by mouth. Take 2 tablets by mouth daily       . oxyCODONE-acetaminophen (PERCOCET) 5-325 MG per tablet Take 1 tablet by mouth every 4 (four) hours as needed.        . potassium chloride SA (K-DUR,KLOR-CON) 20 MEQ tablet Take 20 mEq by mouth 2 (two) times daily.        . pravastatin (PRAVACHOL) 40 MG tablet Take 40 mg by mouth daily.        . traZODone (DESYREL) 50 MG tablet Take 50 mg by mouth as needed.          Allergies  Allergen Reactions  . Codeine   .  Hydrocodone-Acetaminophen     Past Medical History  Diagnosis Date  . Chronic systolic congestive heart failure     Acute decompensation, LVEF less than 20%  . Hypertension   . History of cocaine abuse   . Bronchial asthma   . Benzodiazepine dependence   . Anxiety   . Depression   . Microcytic anemia   . Chronic kidney disease     Stage 3-4  . Cardiomyopathy     possible cocaine induced    ROS: Negative except as per HPI  BP 136/74  Pulse 70  Ht 6\' 2"  (1.88 m)  Wt 296 lb (134.265 kg)  BMI 38.00 kg/m2  PHYSICAL EXAM: Pt is alert and oriented, NAD HEENT: normal Neck: JVP - normal, carotids 2+= without bruits Lungs: CTA bilaterally CV: RRR without murmur or gallop Abd: soft, NT, Positive BS, no hepatomegaly Ext: no C/C/E, distal pulses intact and equal Skin: warm/dry no rash  ASSESSMENT AND PLAN:

## 2010-10-01 NOTE — Patient Instructions (Signed)
Your physician wants you to follow-up in: 6 months with Dr Emelda Fear will receive a reminder letter in the mail two months in advance. If you don't receive a letter, please call our office to schedule the follow-up appointment.

## 2010-10-01 NOTE — Assessment & Plan Note (Signed)
Today's blood pressure of 136/74 is by far the best as blood pressure has been in any of his office visits. We'll continue his current medical program.

## 2010-10-01 NOTE — Assessment & Plan Note (Signed)
The patient's heart failure appears well controlled at present.his creatinine has ranged from 1.5-2.5 mg per deciliter so I have not treated him with an ACE inhibitor or ARB. He is tolerating the combination of carvedilol and hydralazine with isosorbide. I would recommend continuing his current medical therapy at present. As he is without active heart failure signs or symptoms, I think he can proceed with dental extraction under general anesthesia at low risk of cardiac complications. He does need to continue all of his antihypertensive medications and should take them the morning of his surgery as he has extremely malignant hypertension. The patient does have coronary disease as noted in the history of present illness he has no angina.

## 2010-10-01 NOTE — Assessment & Plan Note (Signed)
Moderate disease of the right coronary artery and severe diagonal stenosis noted. Continue with medical therapy in the absence of angina.

## 2010-10-25 ENCOUNTER — Other Ambulatory Visit (HOSPITAL_COMMUNITY): Payer: Self-pay | Admitting: Family Medicine

## 2010-10-25 DIAGNOSIS — S83519A Sprain of anterior cruciate ligament of unspecified knee, initial encounter: Secondary | ICD-10-CM

## 2010-10-26 ENCOUNTER — Ambulatory Visit (INDEPENDENT_AMBULATORY_CARE_PROVIDER_SITE_OTHER): Payer: Self-pay | Admitting: Internal Medicine

## 2010-10-26 ENCOUNTER — Ambulatory Visit (HOSPITAL_COMMUNITY)
Admission: RE | Admit: 2010-10-26 | Discharge: 2010-10-26 | Disposition: A | Discharge status: Home / Self Care | Payer: Self-pay | Source: Ambulatory Visit | Attending: Family Medicine | Admitting: Family Medicine

## 2010-10-26 ENCOUNTER — Encounter: Payer: Self-pay | Admitting: Internal Medicine

## 2010-10-26 DIAGNOSIS — IMO0002 Reserved for concepts with insufficient information to code with codable children: Secondary | ICD-10-CM | POA: Insufficient documentation

## 2010-10-26 DIAGNOSIS — I10 Essential (primary) hypertension: Secondary | ICD-10-CM

## 2010-10-26 DIAGNOSIS — X58XXXA Exposure to other specified factors, initial encounter: Secondary | ICD-10-CM | POA: Insufficient documentation

## 2010-10-26 DIAGNOSIS — I5022 Chronic systolic (congestive) heart failure: Secondary | ICD-10-CM

## 2010-10-26 DIAGNOSIS — M7120 Synovial cyst of popliteal space [Baker], unspecified knee: Secondary | ICD-10-CM | POA: Insufficient documentation

## 2010-10-26 DIAGNOSIS — M25469 Effusion, unspecified knee: Secondary | ICD-10-CM | POA: Insufficient documentation

## 2010-10-26 DIAGNOSIS — S83519A Sprain of anterior cruciate ligament of unspecified knee, initial encounter: Secondary | ICD-10-CM

## 2010-10-26 DIAGNOSIS — M6580 Other synovitis and tenosynovitis, unspecified site: Secondary | ICD-10-CM | POA: Insufficient documentation

## 2010-10-26 DIAGNOSIS — S83419A Sprain of medial collateral ligament of unspecified knee, initial encounter: Secondary | ICD-10-CM | POA: Insufficient documentation

## 2010-10-26 DIAGNOSIS — S83289A Other tear of lateral meniscus, current injury, unspecified knee, initial encounter: Secondary | ICD-10-CM | POA: Insufficient documentation

## 2010-10-26 DIAGNOSIS — M6750 Plica syndrome, unspecified knee: Secondary | ICD-10-CM | POA: Insufficient documentation

## 2010-10-26 NOTE — Progress Notes (Signed)
HPI Cody Ortega returns today for followup. He is a 50 -year-old man with a history of an ischemic cardiomyopathy, class II congestive heart failure, ejection fraction 25% by echo, status post myocardial infarction. The patient has an indication for prophylactic ICD implantation. When I saw him about this several months ago, the patient was uncertain as to whether he wanted to proceed with this procedure. At this point he would like to proceed. Today he mentions that he is having more problems with his right knee and that he is scheduled to undergo MRI scanning. Additional orthopedic evaluation will likely be recommended. He denies syncope, palpitations, or chest pain. He continues to have class II congestive heart failure symptoms. He is however more limited by knee pain. Allergies  Allergen Reactions  . Codeine   . Hydrocodone-Acetaminophen      Current Outpatient Prescriptions  Medication Sig Dispense Refill  . albuterol (VENTOLIN HFA) 108 (90 BASE) MCG/ACT inhaler Inhale 2 puffs into the lungs. 2 puffs as needed       . ALPRAZolam (XANAX XR) 1 MG 24 hr tablet Take 1 mg by mouth every morning.        Marland Kitchen alprazolam (XANAX) 2 MG tablet Take 2 mg by mouth. Take one tablet by mouth four times a day       . amLODipine (NORVASC) 5 MG tablet Take 5 mg by mouth daily.        Marland Kitchen aspirin 81 MG EC tablet Take 81 mg by mouth daily.        . carvedilol (COREG) 25 MG tablet Take 25 mg by mouth. Take 2 tablets by mouth twice a day       . ferrous sulfate 325 (65 FE) MG tablet Take 325 mg by mouth. Take one tablet by mouth two times a day       . furosemide (LASIX) 40 MG tablet Take 40 mg by mouth 2 (two) times daily.        . hydrALAZINE (APRESOLINE) 50 MG tablet Take 50 mg by mouth 3 (three) times daily.        . isosorbide mononitrate (IMDUR) 30 MG 24 hr tablet Take 30 mg by mouth. Take 2 tablets by mouth daily       . potassium chloride SA (K-DUR,KLOR-CON) 20 MEQ tablet Take 20 mEq by mouth 2 (two) times  daily.        . pravastatin (PRAVACHOL) 40 MG tablet Take 40 mg by mouth daily.        . traMADol (ULTRAM) 50 MG tablet Take 50 mg by mouth every 6 (six) hours as needed.        Marland Kitchen DISCONTD: oxyCODONE-acetaminophen (PERCOCET) 5-325 MG per tablet Take 1 tablet by mouth every 4 (four) hours as needed.        Marland Kitchen DISCONTD: traZODone (DESYREL) 50 MG tablet Take 50 mg by mouth as needed.           Past Medical History  Diagnosis Date  . Chronic systolic congestive heart failure     Acute decompensation, LVEF less than 20%  . Hypertension   . History of cocaine abuse   . Bronchial asthma   . Benzodiazepine dependence   . Anxiety   . Depression   . Microcytic anemia   . Chronic kidney disease     Stage 3-4  . Cardiomyopathy     possible cocaine induced    ROS:   All systems reviewed and negative except as noted in the  HPI.   Past Surgical History  Procedure Date  . Tonsillectomy      Family History  Problem Relation Age of Onset  . Breast cancer Mother   . Multiple myeloma Mother   . Prostate cancer Father   . Diabetes Father   . Cerebral palsy Daughter      History   Social History  . Marital Status: Divorced    Spouse Name: N/A    Number of Children: N/A  . Years of Education: N/A   Occupational History  . Unemployed     basketball referee in the past   Social History Main Topics  . Smoking status: Former Research scientist (life sciences)  . Smokeless tobacco: Not on file  . Alcohol Use: No  . Drug Use: Not on file     Reported history of cocaine abuse off and on  . Sexually Active: Not on file   Other Topics Concern  . Not on file   Social History Narrative  . No narrative on file     BP 115/67  Pulse 70  Resp 18  Ht 6\' 3"  (1.905 m)  Wt 293 lb (132.904 kg)  BMI 36.62 kg/m2  Physical Exam:  Obese, well appearing NAD HEENT: Unremarkable Neck:  No JVD, no thyromegally Lymphatics:  No adenopathy Back:  No CVA tenderness Lungs:  Clear. No wheezes, rales, or  rhonchi. HEART:  Regular rate rhythm, no murmurs, no rubs, no clicks Abd:  Flat, positive bowel sounds, no organomegally, no rebound, no guarding Ext:  2 plus pulses, no edema, no cyanosis, no clubbing Skin:  No rashes no nodules Neuro:  CN II through XII intact, motor grossly intact   Assess/Plan:

## 2010-10-26 NOTE — Assessment & Plan Note (Signed)
He has a clear-cut indication for ICD implantation. I discussed the risk benefits, goals and expectations of the procedure with the patient. Because of his ongoing knee problems and history of knee injury, I recommended that these be allowed to be sort out first before prophylactic ICD implantation. If knee surgery were planned, I would like him to be completely healed before considering ICD insertion. For now, he'll continue with maximal medical therapy, a low-sodium diet, as much activity as he can tolerate. Of note, the patient has been intolerant to ARB drugs or ACE inhibitors.

## 2010-10-26 NOTE — Patient Instructions (Signed)
Patient to call to schedule follow up

## 2010-10-26 NOTE — Assessment & Plan Note (Signed)
His blood pressure is well controlled today. He'll continue his current medical therapy. A low-sodium diet is recommended.

## 2010-11-23 ENCOUNTER — Telehealth: Payer: Self-pay | Admitting: Internal Medicine

## 2010-11-23 NOTE — Telephone Encounter (Signed)
Pt came into the office today that to talk with the nurse concerning his ICD placement. Pt was given Medicaid approval for cardiology only. Pt was to have a knee replacement but has put that off. Pt wants to have this done soon. Only has a 90 day window to get this done. Please call the patient to have this done.

## 2010-11-25 NOTE — Telephone Encounter (Signed)
Left message for patient to call me back to schedule procedure

## 2010-11-27 ENCOUNTER — Encounter: Payer: Self-pay | Admitting: *Deleted

## 2010-11-29 ENCOUNTER — Ambulatory Visit (INDEPENDENT_AMBULATORY_CARE_PROVIDER_SITE_OTHER): Payer: Self-pay | Admitting: *Deleted

## 2010-11-29 DIAGNOSIS — E7800 Pure hypercholesterolemia, unspecified: Secondary | ICD-10-CM

## 2010-11-29 DIAGNOSIS — I110 Hypertensive heart disease with heart failure: Secondary | ICD-10-CM

## 2010-11-29 DIAGNOSIS — I2510 Atherosclerotic heart disease of native coronary artery without angina pectoris: Secondary | ICD-10-CM

## 2010-11-29 DIAGNOSIS — I509 Heart failure, unspecified: Secondary | ICD-10-CM

## 2010-11-29 DIAGNOSIS — I10 Essential (primary) hypertension: Secondary | ICD-10-CM

## 2010-11-29 DIAGNOSIS — I5022 Chronic systolic (congestive) heart failure: Secondary | ICD-10-CM

## 2010-11-29 DIAGNOSIS — E785 Hyperlipidemia, unspecified: Secondary | ICD-10-CM

## 2010-11-29 LAB — BASIC METABOLIC PANEL
Calcium: 8.9 mg/dL (ref 8.4–10.5)
Creatinine, Ser: 1.4 mg/dL (ref 0.4–1.5)

## 2010-11-29 LAB — PROTIME-INR: Prothrombin Time: 10.9 s (ref 10.2–12.4)

## 2010-11-29 LAB — CBC WITH DIFFERENTIAL/PLATELET
Basophils Absolute: 0 10*3/uL (ref 0.0–0.1)
Eosinophils Absolute: 0.3 10*3/uL (ref 0.0–0.7)
HCT: 50.3 % (ref 39.0–52.0)
Hemoglobin: 16.6 g/dL (ref 13.0–17.0)
Lymphocytes Relative: 17.1 % (ref 12.0–46.0)
Lymphs Abs: 1.2 10*3/uL (ref 0.7–4.0)
MCHC: 33.1 g/dL (ref 30.0–36.0)
Neutro Abs: 4.9 10*3/uL (ref 1.4–7.7)
RDW: 14.2 % (ref 11.5–14.6)

## 2010-12-06 ENCOUNTER — Encounter (HOSPITAL_COMMUNITY): Payer: Medicaid Other

## 2010-12-06 ENCOUNTER — Ambulatory Visit (HOSPITAL_COMMUNITY)
Admission: RE | Admit: 2010-12-06 | Discharge: 2010-12-07 | Disposition: A | Discharge status: Home / Self Care | Payer: Medicaid Other | Source: Ambulatory Visit | Attending: Internal Medicine | Admitting: Internal Medicine

## 2010-12-06 DIAGNOSIS — I252 Old myocardial infarction: Secondary | ICD-10-CM | POA: Insufficient documentation

## 2010-12-06 DIAGNOSIS — I2510 Atherosclerotic heart disease of native coronary artery without angina pectoris: Secondary | ICD-10-CM | POA: Insufficient documentation

## 2010-12-06 DIAGNOSIS — I2589 Other forms of chronic ischemic heart disease: Secondary | ICD-10-CM | POA: Insufficient documentation

## 2010-12-06 DIAGNOSIS — D638 Anemia in other chronic diseases classified elsewhere: Secondary | ICD-10-CM | POA: Insufficient documentation

## 2010-12-06 DIAGNOSIS — I509 Heart failure, unspecified: Secondary | ICD-10-CM | POA: Insufficient documentation

## 2010-12-06 DIAGNOSIS — I129 Hypertensive chronic kidney disease with stage 1 through stage 4 chronic kidney disease, or unspecified chronic kidney disease: Secondary | ICD-10-CM | POA: Insufficient documentation

## 2010-12-06 DIAGNOSIS — F341 Dysthymic disorder: Secondary | ICD-10-CM | POA: Insufficient documentation

## 2010-12-06 DIAGNOSIS — I5022 Chronic systolic (congestive) heart failure: Secondary | ICD-10-CM | POA: Insufficient documentation

## 2010-12-06 DIAGNOSIS — Z79899 Other long term (current) drug therapy: Secondary | ICD-10-CM | POA: Insufficient documentation

## 2010-12-06 DIAGNOSIS — J449 Chronic obstructive pulmonary disease, unspecified: Secondary | ICD-10-CM | POA: Insufficient documentation

## 2010-12-06 DIAGNOSIS — J4489 Other specified chronic obstructive pulmonary disease: Secondary | ICD-10-CM | POA: Insufficient documentation

## 2010-12-06 DIAGNOSIS — N184 Chronic kidney disease, stage 4 (severe): Secondary | ICD-10-CM | POA: Insufficient documentation

## 2010-12-06 DIAGNOSIS — Z8673 Personal history of transient ischemic attack (TIA), and cerebral infarction without residual deficits: Secondary | ICD-10-CM | POA: Insufficient documentation

## 2010-12-06 LAB — SURGICAL PCR SCREEN
MRSA, PCR: NEGATIVE
Staphylococcus aureus: NEGATIVE

## 2010-12-07 ENCOUNTER — Ambulatory Visit (HOSPITAL_COMMUNITY): Payer: Medicaid Other

## 2010-12-10 NOTE — H&P (Signed)
  NAMEWATKINS, LACHAT NO.:  [id number]  MEDICAL RECORD NO.:  [id number]  LOCATION:  [facility]                         FACILITY:  [facility]  PHYSICIAN:  Champ Mungo. Lovena Le, MD    DATE OF BIRTH:  Oct 10, 1960  DATE OF ADMISSION:  12/06/2010 DATE OF DISCHARGE:                             HISTORY & PHYSICAL   ADMISSION DIAGNOSIS:  The patient is here today to undergo insertion of a prophylactic ICD.  HISTORY OF PRESENT ILLNESS:  The patient is a 50 year old male with a longstanding ischemic cardiomyopathy.  His ejection fraction has been 25% by echo for years.  He has class II congestive heart failure symptoms.  He has been on maximal medical therapy.  Of note, the patient has not been intolerant of ACE inhibitors and for this reason, is on hydralazine and isosorbide.  He is on high-dose carvedilol.  The patient has never had syncope.  PAST MEDICAL HISTORY:  Notable for: 1. Chronic systolic heart failure. 2. He has a history of remote polysubstance abuse. 3. He has a history of bronchial asthma. 4. He has a history of hypertension.  FAMILY HISTORY:  Negative for premature coronary artery disease.  SOCIAL HISTORY:  The patient is divorced.  He denies tobacco or ethanol abuse.  REVIEW OF SYSTEMS:  All systems are negative except as noted in the HPI.  PHYSICAL EXAMINATION:  GENERAL:  He is a pleasant well-appearing, middle- aged man in no distress. VITAL SIGNS:  Blood pressure was 150/82, the pulse was 90 and regular, respirations were 18, temperature is 97, weight was 134 kg.  He was 6 feet, 2 inches tall. HEENT:  Normocephalic and atraumatic.  Pupils are equal and round. Oropharynx is moist.  Sclerae anicteric. NECK:  No jugular venous distention.  There is no thyromegaly.  Trachea is midline.  Carotids are 2+ and symmetric. LUNGS:  Clear bilaterally to auscultation.  No wheezes, rales, or rhonchi are present.  There is no increased work of breathing. CARDIOVASCULAR:   Regular rate and rhythm.  Normal S1 and S2.  The PMI is enlarged and laterally displaced.  ABDOMEN:  Soft, nontender, nondistended.  There is no organomegaly.  Bowel sounds are present.  No rebound or guarding  EXTREMITIES:  No cyanosis, clubbing, or edema. Pulses are 2+ and symmetric. NEUROLOGIC:  Nonfocal.  IMPRESSION: 1. Ischemic cardiomyopathy. 2. Chronic systolic heart failure, ejection fraction 25% despite     maximal medical therapy. 3. Hypertension.  DISCUSSION:  The risks, benefits, goals, and expectations of prophylactic ICD insertion have been discussed with the patient and he wished to proceed.  This will be carried out later today.     Champ Mungo. Lovena Le, MD     GWT/MEDQ  D:  12/06/2010  T:  12/06/2010  Job:  [id number]  Electronically Signed by Cristopher Peru MD on 12/10/2010 09:54:22 AM

## 2010-12-10 NOTE — Op Note (Signed)
NAMEJACKSYN, TRAINER NO.:  [id number]  MEDICAL RECORD NO.:  [id number]  LOCATION:  2008                         FACILITY:  [facility]  PHYSICIAN:  Champ Mungo. Lovena Le, MD    DATE OF BIRTH:  Dec 05, 1960  DATE OF PROCEDURE:  12/06/2010 DATE OF DISCHARGE:                              OPERATIVE REPORT   PROCEDURE PERFORMED:  Insertion of a single chamber defibrillator.  INDICATION:  Ischemic cardiomyopathy, longstanding EF 25%, class II congestive heart failure despite maximal medical therapy.  INTRODUCTION:  The patient is a 50 year old male with a longstanding ischemic cardiomyopathy status post myocardial infarction.  His ejection fraction was 25%.  He has class II congestive heart failure.  He has been on maximal medical therapy.  He is on Imdur and nitrates as he is unable to take ACE inhibitors.  He is now referred for prophylactic ICD implantation.  PROCEDURE IN DETAIL:  After informed consent was obtained, the patient was taken to the diagnostic EP lab in fasting state.  After usual preparation and draping, intravenous fentanyl and midazolam was given for sedation.  30 mL of lidocaine was infiltrated into the left infraclavicular region.  A 6-cm incision was carried out over this region and electrocautery was utilized to dissect down to the fascial plane.  The left subclavian vein was then punctured and the Guidant [employer]otak [employer]iance G dual coil active fixation defibrillation lead, serial number [id number] was advanced into the right ventricle.  Mapping was carried out in the final site.  R-wave was measured 12 mV and the pacing impedance was 600 ohms, threshold 0.7 volts at 0.5 milliseconds with the lead actively fixed.  There was a prominent injury current.  10 volts pacing did not stimulate the diaphragm.  With the defibrillation lead in satisfactory position, it was secured to subpectoralis fascia with a figure-of-eight silk suture and the sewing sleeve was  also secured with silk suture.  Electrocautery was then utilized to make a subcutaneous pocket.  Antibiotic irrigation was utilized to irrigate the pocket. Electrocautery was utilized to assure hemostasis.  The Medtronic single- chamber defibrillator, serial number [id number] was connected to the defibrillation lead and placed back in the subcutaneous pocket.  The pocket was irrigated with antibiotic irrigation and the patient was more deeply sedated for defibrillation threshold testing.  After the patient was more deeply sedated with fentanyl and Versed, VF was induced with a T-wave shock.  A 20 joules shock was subsequently delivered, which terminated the ventricular fibrillation and restored sinus rhythm.  No additional defibrillation threshold testing was carried out and the incision closed with 2-0 and 3-0 Vicryl.  Benzoin and Steri-Strips were painted on the skin, pressure dressing was applied, and the patient was returned to his room in satisfactory condition.  COMPLICATIONS:  There were no immediate procedure complications.  RESULTS:  This demonstrate successful implantation of a Medtronic single chamber ICD in a patient with longstanding ischemic cardiomyopathy, status post MI, ejection fraction 25%, class II heart failure despite maximal medical therapy.     Champ Mungo. Lovena Le, MD     GWT/MEDQ  D:  12/06/2010  T:  12/06/2010  Job:  [id number]  Electronically Signed by Cristopher Peru  MD on 12/10/2010 09:54:25 AM

## 2010-12-15 ENCOUNTER — Ambulatory Visit (INDEPENDENT_AMBULATORY_CARE_PROVIDER_SITE_OTHER): Payer: Self-pay | Admitting: *Deleted

## 2010-12-15 ENCOUNTER — Other Ambulatory Visit: Payer: Self-pay | Admitting: Internal Medicine

## 2010-12-15 ENCOUNTER — Ambulatory Visit: Payer: Self-pay | Admitting: *Deleted

## 2010-12-15 DIAGNOSIS — I428 Other cardiomyopathies: Secondary | ICD-10-CM

## 2010-12-15 DIAGNOSIS — I5022 Chronic systolic (congestive) heart failure: Secondary | ICD-10-CM

## 2010-12-15 LAB — ICD DEVICE OBSERVATION
BATTERY VOLTAGE: 3.2 V
RV LEAD AMPLITUDE: 16 mv
RV LEAD THRESHOLD: 0.75 V

## 2010-12-28 ENCOUNTER — Telehealth: Payer: Self-pay | Admitting: Internal Medicine

## 2010-12-28 NOTE — Telephone Encounter (Signed)
Pt c/o of prickly "sutures" on incision line. Pt said it is something that just came up w/i the last four days. Pt says he has the urge to flick and or touch. Pt wants to know if this normal and/or if this is something that will dissolve on its on. Please return pt call to advise/discuss.

## 2010-12-28 NOTE — Telephone Encounter (Signed)
Pt complaining of feeling a "fish line" or "wire" when he touches the left end of his incision x 4 days (approx).  He denies pain at the site of incision and only feels the "fish line" on his finger that is touching the incision.  He states that nothing is "sticking out".  He states incision looks good.

## 2010-12-29 ENCOUNTER — Ambulatory Visit (INDEPENDENT_AMBULATORY_CARE_PROVIDER_SITE_OTHER): Payer: Self-pay | Admitting: *Deleted

## 2010-12-29 DIAGNOSIS — I428 Other cardiomyopathies: Secondary | ICD-10-CM

## 2010-12-29 NOTE — Progress Notes (Signed)
Wound check--pt had stitch that had to be removed.

## 2010-12-29 NOTE — Telephone Encounter (Signed)
After explanation of what pt was feeling at site--scheduled pt to come in for wound recheck to make sure no suture is coming out. Pt scheduled for 8-29 @ 1500. Pt aware of appt.

## 2011-01-09 NOTE — Telephone Encounter (Signed)
Ok

## 2011-02-10 NOTE — Discharge Summary (Signed)
NAMEFRED, ADJEI NO.:  [id number]  MEDICAL RECORD NO.:  [id number]  LOCATION:  2008                         FACILITY:  [facility]  PHYSICIAN:  Champ Mungo. Lovena Le, MD    DATE OF BIRTH:  18-Oct-1960  DATE OF ADMISSION:  12/06/2010 DATE OF DISCHARGE:  12/07/2010                              DISCHARGE SUMMARY   The patient's primary care is received through [facility].  PRIMARY CARDIOLOGIST:  Juanda Bond. Burt Knack, MD.  ELECTROPHYSIOLOGIST:  Champ Mungo. Lovena Le, MD  PRIMARY DIAGNOSIS:  Nonischemic cardiomyopathy.  SECONDARY DIAGNOSES: 1. Coronary artery disease - moderate disease of the right coronary     artery and severe diagonal stenosis noted at last catheterization     in November 2011.  At that time, medical therapy was recommended as     the patient had no angina. 2. Hypertension. 3. Paroxysmal atrial fibrillation in 2008 with spontaneous conversion. 4. History of cerebrovascular accident in 2004. 5. Chronic kidney disease - stage 4. 6. History of polysubstance abuse. 7. Anxiety and depression. 8. Chronic obstructive pulmonary disease. 9. Microcytic anemia. 10.Benzodiazepine dependence.  ALLERGIES:  The patient is allergic or intolerant of CODEINE, VICODIN, AND PERCOCET.  PROCEDURES THIS ADMISSION: 1. Implantation of a single chamber implantable cardiac defibrillator     on December 06, 2010, by Dr. Lovena Le.  The patient received a Medtronic     model number [id number] defibrillator with model number [id number] right     ventricular lead.  DFTs at time of implant were less or equal to 20     joules.  The patient had no early apparent complications. 2. Chest x-ray on December 07, 2010, demonstrated no pneumothorax, status     post device implant.  BRIEF HISTORY OF PRESENT ILLNESS:  Mr. Leyendecker is a 50 year old male with a long-standing history of nonischemic cardiomyopathy despite optimal medical therapy.  Because of this, he was referred to Dr. Lovena Le in the outpatient  setting for evaluation of ICD implant for primary prevention of sudden death.  Risks, benefits, and alternatives of the procedure were discussed with the patient and he wished to proceed.  HOSPITAL COURSE:  The patient was admitted on December 06, 2010, for planned implantation of implantable cardiac defibrillator.  This was carried out by Dr. Lovena Le, with details as outlined above.  He was monitored on telemetry overnight which demonstrated sinus rhythm with occasional PVCs.  Chest x-ray was obtained which demonstrated no pneumothorax, status post device implant.  Device was interrogated and found to be functioning normally.  Wound care, arm mobility, and shock plan were discussed with the patient.  He was evaluated by Dr. Lovena Le and consider stable for discharge.  FOLLOWUP APPOINTMENTS: 1. [facility] on December 15, 2010, at 3:45 p.m.     for wound check. 2. Dr. Lovena Le on March 08, 2011, at 10:45 a.m. 3. Dr. Burt Knack as scheduled. 4. [facility] as scheduled.  DISCHARGE INSTRUCTIONS: 1. Increase activity slowly. 2. No driving for 1 week. 3. Follow low-sodium heart-healthy diet. 4. See supplemental device discharge instructions discharge     instructions for wound care and arm mobility. 5. Keep incision clean and dry for 1 week.  DISCHARGE MEDICATIONS:  1. Albuterol inhaler 1-2 puffs every 4 hours as needed for shortness     of breath. 2. Aleve 220 mg 1-2 tablets every 12 hours as needed for pain. 3. Alprazolam XR 1 mg daily. 4. Amlodipine 10 mg daily. 5. Aspirin 81 mg daily. 6. Coreg 25 mg twice daily. 7. Furosemide 40 mg twice daily. 8. Ferrous sulfate 325 mg twice daily. 9. Hydralazine 50 mg 3 times daily. 10.Ibuprofen 200 mg 4 tablets every 8 hours as needed for joint pain. 11.Imdur 30 mg 2 tablets every evening. 12.Pravastatin 40 mg daily at bedtime. 13.Potassium chloride 20 mEq 2 tablets daily. 14.Trazodone 50 mg daily at bedtime as needed for   sleep. 15.Xanax 1 mg 1 tablet 4 times daily as needed for anxiety.  DISPOSITION:  The patient was seen and exam by Dr. Lovena Le on December 07, 2010, and considered stable for discharge.  DURATION OF DISCHARGE ENCOUNTER:  35 minutes.     Chanetta Marshall, RN,BSN   ______________________________ Champ Mungo. Lovena Le, MD    AS/MEDQ  D:  12/07/2010  T:  12/07/2010  Job:  [id number]  cc:   Juanda Bond. Burt Knack, MD  Electronically Signed by Chanetta Marshall RNBSN on 12/17/2010 01:18:58 PM Electronically Signed by Cristopher Peru MD on 02/10/2011 06:35:34 PM

## 2011-02-14 LAB — BASIC METABOLIC PANEL
BUN: 19
CO2: 25
CO2: 31
Calcium: 9.1
Creatinine, Ser: 1.78 — ABNORMAL HIGH
GFR calc non Af Amer: 46 — ABNORMAL LOW
Glucose, Bld: 110 — ABNORMAL HIGH
Glucose, Bld: 97
Potassium: 4.3

## 2011-02-14 LAB — CBC
HCT: 48.7
Hemoglobin: 15.9
MCHC: 33.5
MCHC: 33.9
MCV: 86.8
Platelets: 235
RDW: 13.7
RDW: 14

## 2011-02-14 LAB — PROTIME-INR
INR: 2 — ABNORMAL HIGH
Prothrombin Time: 20.7 — ABNORMAL HIGH

## 2011-02-15 LAB — CARDIAC PANEL(CRET KIN+CKTOT+MB+TROPI)
CK, MB: 2
Relative Index: INVALID
Relative Index: INVALID
Total CK: 87
Troponin I: 0.08 — ABNORMAL HIGH
Troponin I: 0.09 — ABNORMAL HIGH

## 2011-02-15 LAB — I-STAT 8, (EC8 V) (CONVERTED LAB)
BUN: 26 — ABNORMAL HIGH
Chloride: 106
Glucose, Bld: 92
pCO2, Ven: 41.4 — ABNORMAL LOW
pH, Ven: 7.417 — ABNORMAL HIGH

## 2011-02-15 LAB — CBC
HCT: 44.2
HCT: 51.5
MCHC: 33.6
MCHC: 34
MCV: 86.2
MCV: 87.4
MCV: 87.8
MCV: 87.8
Platelets: 201
Platelets: 215
Platelets: 225
Platelets: 228
Platelets: 233
RBC: 5.38
RBC: 5.49
RDW: 13.5
RDW: 13.8
RDW: 14
WBC: 7.4
WBC: 8.7

## 2011-02-15 LAB — BASIC METABOLIC PANEL
BUN: 16
BUN: 17
BUN: 20
CO2: 26
Calcium: 9.3
Calcium: 9.3
Chloride: 101
Chloride: 103
Chloride: 106
Creatinine, Ser: 1.47
Creatinine, Ser: 1.52 — ABNORMAL HIGH
Creatinine, Ser: 1.8 — ABNORMAL HIGH
GFR calc Af Amer: 50 — ABNORMAL LOW
GFR calc Af Amer: >60
Glucose, Bld: 94
Potassium: 3.5

## 2011-02-15 LAB — URINALYSIS, MICROSCOPIC ONLY
Ketones, ur: NEGATIVE
Leukocytes, UA: NEGATIVE
Nitrite: NEGATIVE
Protein, ur: 30 — AB
pH: 5.5

## 2011-02-15 LAB — POCT CARDIAC MARKERS
Operator id: 189501
Troponin i, poc: <0.05

## 2011-02-15 LAB — RAPID URINE DRUG SCREEN, HOSP PERFORMED
Amphetamines: NOT DETECTED
Cocaine: POSITIVE — AB
Opiates: NOT DETECTED
Tetrahydrocannabinol: POSITIVE — AB

## 2011-02-15 LAB — COMPREHENSIVE METABOLIC PANEL
Albumin: 3.4 — ABNORMAL LOW
BUN: 23
Calcium: 8.9
Creatinine, Ser: 1.78 — ABNORMAL HIGH
Total Bilirubin: 0.6
Total Protein: 6.3

## 2011-02-15 LAB — HEPARIN LEVEL (UNFRACTIONATED)
Heparin Unfractionated: 0.39
Heparin Unfractionated: 0.41
Heparin Unfractionated: 0.44
Heparin Unfractionated: 0.47
Heparin Unfractionated: 0.6

## 2011-02-15 LAB — PROTIME-INR
INR: 1
Prothrombin Time: 17 — ABNORMAL HIGH

## 2011-02-15 LAB — B-NATRIURETIC PEPTIDE (CONVERTED LAB)
Pro B Natriuretic peptide (BNP): 212 — ABNORMAL HIGH
Pro B Natriuretic peptide (BNP): 384 — ABNORMAL HIGH

## 2011-02-15 LAB — CK TOTAL AND CKMB (NOT AT ARMC)
CK, MB: 2.2
Relative Index: INVALID
Total CK: 76

## 2011-02-15 LAB — TSH: TSH: 2.714

## 2011-02-23 ENCOUNTER — Emergency Department (HOSPITAL_COMMUNITY)
Admission: EM | Admit: 2011-02-23 | Discharge: 2011-02-23 | Discharge status: Against Medical Advice | Payer: Self-pay | Attending: Emergency Medicine | Admitting: Emergency Medicine

## 2011-02-23 ENCOUNTER — Emergency Department (HOSPITAL_COMMUNITY): Payer: Self-pay

## 2011-02-23 DIAGNOSIS — J40 Bronchitis, not specified as acute or chronic: Secondary | ICD-10-CM | POA: Insufficient documentation

## 2011-02-23 DIAGNOSIS — Z79899 Other long term (current) drug therapy: Secondary | ICD-10-CM | POA: Insufficient documentation

## 2011-02-23 DIAGNOSIS — L600 Ingrowing nail: Secondary | ICD-10-CM | POA: Insufficient documentation

## 2011-02-23 DIAGNOSIS — Z95810 Presence of automatic (implantable) cardiac defibrillator: Secondary | ICD-10-CM | POA: Insufficient documentation

## 2011-02-23 DIAGNOSIS — R079 Chest pain, unspecified: Secondary | ICD-10-CM | POA: Insufficient documentation

## 2011-02-23 LAB — CBC
MCH: 31.3 pg (ref 26.0–34.0)
MCV: 90.7 fL (ref 78.0–100.0)
Platelets: 199 10*3/uL (ref 150–400)
RDW: 13.3 % (ref 11.5–15.5)
WBC: 8.1 10*3/uL (ref 4.0–10.5)

## 2011-02-23 LAB — DIFFERENTIAL
Basophils Relative: 0 % (ref 0–1)
Eosinophils Absolute: 0.2 10*3/uL (ref 0.0–0.7)
Eosinophils Relative: 2 % (ref 0–5)
Lymphs Abs: 1.3 10*3/uL (ref 0.7–4.0)
Monocytes Relative: 7 % (ref 3–12)

## 2011-02-23 LAB — BASIC METABOLIC PANEL
Calcium: 9.3 mg/dL (ref 8.4–10.5)
Creatinine, Ser: 1.32 mg/dL (ref 0.50–1.35)
GFR calc Af Amer: 72 mL/min — ABNORMAL LOW (ref >90)
Sodium: 140 mEq/L (ref 135–145)

## 2011-02-23 LAB — POCT I-STAT TROPONIN I: Troponin i, poc: 0 ng/mL (ref 0.00–0.08)

## 2011-02-28 ENCOUNTER — Encounter: Payer: Self-pay | Admitting: Cardiovascular Disease

## 2011-02-28 ENCOUNTER — Ambulatory Visit (INDEPENDENT_AMBULATORY_CARE_PROVIDER_SITE_OTHER): Payer: Medicaid Other | Admitting: Cardiovascular Disease

## 2011-02-28 DIAGNOSIS — I2510 Atherosclerotic heart disease of native coronary artery without angina pectoris: Secondary | ICD-10-CM

## 2011-02-28 DIAGNOSIS — I110 Hypertensive heart disease with heart failure: Secondary | ICD-10-CM

## 2011-02-28 DIAGNOSIS — I119 Hypertensive heart disease without heart failure: Secondary | ICD-10-CM

## 2011-02-28 DIAGNOSIS — I5022 Chronic systolic (congestive) heart failure: Secondary | ICD-10-CM

## 2011-02-28 DIAGNOSIS — N529 Male erectile dysfunction, unspecified: Secondary | ICD-10-CM | POA: Insufficient documentation

## 2011-02-28 DIAGNOSIS — I509 Heart failure, unspecified: Secondary | ICD-10-CM

## 2011-02-28 MED ORDER — SILDENAFIL CITRATE 50 MG PO TABS: 50.0000 mg | ORAL_TABLET | Freq: Every day | ORAL | Status: DC | PRN

## 2011-02-28 NOTE — Assessment & Plan Note (Signed)
The patient has hypertensive heart disease. His blood pressure is under much better control. He will discontinue his long-acting nitrate but otherwise will remain on current medications.
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
## 2011-02-28 NOTE — Assessment & Plan Note (Signed)
The patient is stable without angina. Continue aspirin 81 mg daily. Continue secondary risk reduction measures with pravastatin and treatment of his hypertension.

## 2011-02-28 NOTE — Patient Instructions (Signed)
Your physician has recommended you make the following change in your medication: STOP Isosorbide MN, START Viagra 50mg  take as needed  Your physician recommends that you schedule a follow-up appointment in: 4 MONTHS

## 2011-02-28 NOTE — Assessment & Plan Note (Signed)
The patient was written a prescription for Viagra 50 mg as needed. His isosorbide was discontinued. He understands the contraindication with Viagra and concomitant nitrates.

## 2011-02-28 NOTE — Assessment & Plan Note (Signed)
The patient is status post ICD implant. His defibrillator site is well healed. He is followed by Dr. Lovena Le.

## 2011-03-08 ENCOUNTER — Encounter: Payer: Self-pay | Admitting: Internal Medicine

## 2011-03-08 ENCOUNTER — Ambulatory Visit (INDEPENDENT_AMBULATORY_CARE_PROVIDER_SITE_OTHER): Payer: Medicaid Other | Admitting: Internal Medicine

## 2011-03-08 DIAGNOSIS — Z95810 Presence of automatic (implantable) cardiac defibrillator: Secondary | ICD-10-CM

## 2011-03-08 DIAGNOSIS — I10 Essential (primary) hypertension: Secondary | ICD-10-CM

## 2011-03-08 DIAGNOSIS — I428 Other cardiomyopathies: Secondary | ICD-10-CM

## 2011-03-08 DIAGNOSIS — I5022 Chronic systolic (congestive) heart failure: Secondary | ICD-10-CM

## 2011-03-08 LAB — ICD DEVICE OBSERVATION
BATTERY VOLTAGE: 3.22 V
CHARGE TIME: 8.6 s
RV LEAD AMPLITUDE: 11.1 mv
RV LEAD IMPEDENCE ICD: 399 Ohm

## 2011-03-08 NOTE — Assessment & Plan Note (Signed)
His device is working normally. We'll plan to recheck in several months. 

## 2011-03-08 NOTE — Assessment & Plan Note (Signed)
His symptoms remain class II. He'll continue his current medical therapy.

## 2011-03-08 NOTE — Patient Instructions (Signed)
Your physician recommends that you schedule a follow-up appointment in: 3 months in the device clinic and 12 months with Dr Taylor  

## 2011-03-08 NOTE — Assessment & Plan Note (Signed)
His blood pressure is well controlled. I've asked the patient to maintain a low-sodium diet. He is able now to return to his usual regular daily exercise.

## 2011-03-08 NOTE — Progress Notes (Signed)
HPI Cody Ortega returns today for followup. He is a 50 year old man with an ischemic cardiomyopathy status post myocardial infarction, chronic class II systolic heart failure, status post ICD implantation. Since his device was implanted he has done well. He notes that he is not as anxious as he was. He denies chest pain or shortness of breath. No peripheral edema. Allergies  Allergen Reactions  . Codeine   . Hydrocodone-Acetaminophen      Current Outpatient Prescriptions  Medication Sig Dispense Refill  . albuterol (VENTOLIN HFA) 108 (90 BASE) MCG/ACT inhaler Inhale 2 puffs into the lungs. 2 puffs as needed       . ALPRAZolam (XANAX XR) 1 MG 24 hr tablet Take 1 mg by mouth every morning.        Marland Kitchen alprazolam (XANAX) 2 MG tablet Take 2 mg by mouth. Take one tablet by mouth four times a day       . amLODipine (NORVASC) 5 MG tablet Take 5 mg by mouth daily.        Marland Kitchen aspirin 81 MG EC tablet Take 81 mg by mouth daily.        . carvedilol (COREG) 25 MG tablet Take 25 mg by mouth 2 (two) times daily with a meal.       . ferrous sulfate 325 (65 FE) MG tablet Take 325 mg by mouth. Take one tablet by mouth two times a day       . furosemide (LASIX) 40 MG tablet Take 40 mg by mouth 2 (two) times daily.        . hydrALAZINE (APRESOLINE) 50 MG tablet Take 50 mg by mouth 3 (three) times daily.        . potassium chloride SA (K-DUR,KLOR-CON) 20 MEQ tablet Take 40 mEq by mouth daily.       . pravastatin (PRAVACHOL) 40 MG tablet Take 40 mg by mouth daily.        . sildenafil (VIAGRA) 50 MG tablet Take 1 tablet (50 mg total) by mouth daily as needed for erectile dysfunction.  10 tablet  2  . traZODone (DESYREL) 50 MG tablet Take 50 mg by mouth at bedtime. As needed          Past Medical History  Diagnosis Date  . Chronic systolic congestive heart failure     Acute decompensation, LVEF less than 20%  . Hypertension   . History of cocaine abuse   . Bronchial asthma   . Benzodiazepine dependence   .  Anxiety   . Depression   . Microcytic anemia   . Chronic kidney disease     Stage 3-4  . Cardiomyopathy     possible cocaine induced    ROS:   All systems reviewed and negative except as noted in the HPI.   Past Surgical History  Procedure Date  . Tonsillectomy   . Transthoracic echocardiogram 10/2006, 12/2009     Family History  Problem Relation Age of Onset  . Breast cancer Mother   . Multiple myeloma Mother   . Prostate cancer Father   . Diabetes Father   . Cerebral palsy Daughter      History   Social History  . Marital Status: Divorced    Spouse Name: N/A    Number of Children: N/A  . Years of Education: N/A   Occupational History  . Unemployed     basketball referee in the past   Social History Main Topics  . Smoking status: Former Research scientist (life sciences)  .  Smokeless tobacco: Not on file  . Alcohol Use: No  . Drug Use: Not on file     Reported history of cocaine abuse off and on  . Sexually Active: Not on file   Other Topics Concern  . Not on file   Social History Narrative   Living with a son who is a 30 year old.  He is not  working, unemployed for 3 years.  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  Denied use of alcohol.  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        BP 124/82  Pulse 74  Ht 6\' 3"  (1.905 m)  Wt 292 lb 6.4 oz (132.632 kg)  BMI 36.55 kg/m2  Physical Exam:  Well appearing middle-aged man, NAD HEENT: Unremarkable Neck:  No JVD, no thyromegally Lymphatics:  No adenopathy Back:  No CVA tenderness Lungs:  Clear with no wheezes, rales, or rhonchi. Well-healed ICD incision. HEART:  Regular rate rhythm, no murmurs, no rubs, no clicks Abd:  soft, positive bowel sounds, no organomegally, no rebound, no guarding Ext:  2 plus pulses, no edema, no cyanosis, no clubbing Skin:  No rashes no nodules Neuro:  CN II through XII intact, motor grossly intact  DEVICE  Normal device function.  See PaceArt for details.    Assess/Plan:

## 2011-05-05 ENCOUNTER — Encounter: Payer: Self-pay | Admitting: Internal Medicine

## 2011-06-08 ENCOUNTER — Ambulatory Visit (INDEPENDENT_AMBULATORY_CARE_PROVIDER_SITE_OTHER): Payer: Medicaid Other | Admitting: *Deleted

## 2011-06-08 ENCOUNTER — Encounter: Payer: Self-pay | Admitting: Internal Medicine

## 2011-06-08 DIAGNOSIS — I5022 Chronic systolic (congestive) heart failure: Secondary | ICD-10-CM

## 2011-06-08 DIAGNOSIS — I428 Other cardiomyopathies: Secondary | ICD-10-CM

## 2011-06-08 LAB — ICD DEVICE OBSERVATION
CHARGE TIME: 8.5 s
DEV-0020ICD: NEGATIVE
RV LEAD IMPEDENCE ICD: 399 Ohm
RV LEAD THRESHOLD: 1 V

## 2011-06-08 NOTE — Progress Notes (Signed)
Device check in clinic

## 2011-07-05 ENCOUNTER — Encounter: Payer: Self-pay | Admitting: Cardiovascular Disease

## 2011-07-05 ENCOUNTER — Ambulatory Visit (INDEPENDENT_AMBULATORY_CARE_PROVIDER_SITE_OTHER): Payer: Medicaid Other | Admitting: Cardiovascular Disease

## 2011-07-05 DIAGNOSIS — I10 Essential (primary) hypertension: Secondary | ICD-10-CM

## 2011-07-05 DIAGNOSIS — I2510 Atherosclerotic heart disease of native coronary artery without angina pectoris: Secondary | ICD-10-CM

## 2011-07-05 DIAGNOSIS — I5022 Chronic systolic (congestive) heart failure: Secondary | ICD-10-CM

## 2011-07-05 NOTE — Patient Instructions (Signed)
Your physician wants you to follow-up in: 6 MONTHS. You will receive a reminder letter in the mail two months in advance. If you don't receive a letter, please call our office to schedule the follow-up appointment.  Your physician recommends that you return for a FASTING LIPID, LIVER, BMP--nothing to eat or drink after midnight, lab opens at 8:30 (07/07/11)  Your physician recommends that you continue on your current medications as directed. Please refer to the Current Medication list given to you today.

## 2011-07-05 NOTE — Assessment & Plan Note (Signed)
The patient appears euvolemic. He is status post ICD implantation. He is on good medical program and will continue the same. The patient is not on an ACE inhibitor or ARB because of his elevated creatinine. Will followup labs to see if he may be a candidate for one of these drugs.

## 2011-07-05 NOTE — Assessment & Plan Note (Signed)
The patient has malignant hypertension, currently controlled on a combination of hydralazine, carvedilol, and amlodipine. He still has elevated blood pressure readings when he first wakes up, but I think we are limited on how aggressively we can treat him because most of his readings throughout the day are in a good range and he already has symptoms when his blood pressure is low. We'll continue same meds. Recommend check a metabolic panel for monitoring of renal function.

## 2011-07-05 NOTE — Progress Notes (Signed)
HPI:  51 year old gentleman presenting for followup of malignant hypertension, coronary artery disease, and mixed cardiomyopathy. The patient underwent ICD implantation last year. He has a history of polysubstance abuse. He has been compliant with his medical program and returns today for routine followup.  Overall the patient has been doing well. He does note an upper respiratory infection over the past few weeks and has had some cough and mild shortness of breath. He's had no chest pain or pressure. He denies leg swelling, orthopnea, or PND. He reports no ICD discharges. He continues to have elevated blood pressure in the morning, but his afternoon and evening blood pressures are in the low to normal range. He feels fatigued and lethargic when his blood pressure is low.  Outpatient Encounter Prescriptions as of 07/05/2011  Medication Sig Dispense Refill  . albuterol (VENTOLIN HFA) 108 (90 BASE) MCG/ACT inhaler Inhale 2 puffs into the lungs. 2 puffs as needed       . ALPRAZolam (XANAX XR) 1 MG 24 hr tablet Take 1 mg by mouth every morning.        Marland Kitchen alprazolam (XANAX) 2 MG tablet Take 2 mg by mouth. Take one tablet by mouth four times a day       . amLODipine (NORVASC) 5 MG tablet Take 5 mg by mouth daily.        Marland Kitchen aspirin 81 MG EC tablet Take 81 mg by mouth daily.        . carvedilol (COREG) 25 MG tablet Take 25 mg by mouth 2 (two) times daily with a meal.       . DM-APAP-CPM (FLU BP MAXIMUM STRENGTH PO) Take 500 mg by mouth daily.      . ferrous sulfate 325 (65 FE) MG tablet Take 325 mg by mouth. Take one tablet by mouth two times a day       . furosemide (LASIX) 40 MG tablet Take 40 mg by mouth 2 (two) times daily.        . hydrALAZINE (APRESOLINE) 50 MG tablet Take 50 mg by mouth 3 (three) times daily.        . potassium chloride SA (K-DUR,KLOR-CON) 20 MEQ tablet Take 40 mEq by mouth daily.       . pravastatin (PRAVACHOL) 40 MG tablet Take 40 mg by mouth daily.        . traZODone (DESYREL) 50  MG tablet Take 50 mg by mouth at bedtime. As needed         Allergies  Allergen Reactions  . Codeine   . Hydrocodone-Acetaminophen     Past Medical History  Diagnosis Date  . Chronic systolic congestive heart failure     Acute decompensation, LVEF less than 20%  . Hypertension   . History of cocaine abuse   . Bronchial asthma   . Benzodiazepine dependence   . Anxiety   . Depression   . Microcytic anemia   . Chronic kidney disease     Stage 3-4  . Cardiomyopathy     possible cocaine induced    ROS: Negative except as per HPI  BP 128/86  Pulse 59  Ht 6\' 3"  (1.905 m)  Wt 137.168 kg (302 lb 6.4 oz)  BMI 37.80 kg/m2  PHYSICAL EXAM: Pt is alert and oriented, overweight male in  NAD HEENT: normal Neck: JVP - normal, carotids 2+= without bruits Lungs: CTA bilaterally CV: RRR without murmur or gallop Abd: soft, NT, Positive BS, no hepatomegaly Ext: no C/C/E, distal  pulses intact and equal Skin: warm/dry no rash  EKG:  Sinus rhythm at 59 beats per minute, nonspecific ST and T wave abnormality, otherwise within normal limits.  ASSESSMENT AND PLAN:

## 2011-07-05 NOTE — Assessment & Plan Note (Signed)
The patient is stable without angina. He will continue on a combination of aspirin and carvedilol. He is also on pravastatin for lipid lowering. Will draw a lipid panel with his upcoming lab work.

## 2011-07-07 ENCOUNTER — Other Ambulatory Visit (INDEPENDENT_AMBULATORY_CARE_PROVIDER_SITE_OTHER): Payer: Medicaid Other

## 2011-07-07 DIAGNOSIS — I10 Essential (primary) hypertension: Secondary | ICD-10-CM

## 2011-07-07 DIAGNOSIS — I2510 Atherosclerotic heart disease of native coronary artery without angina pectoris: Secondary | ICD-10-CM

## 2011-07-07 DIAGNOSIS — I5022 Chronic systolic (congestive) heart failure: Secondary | ICD-10-CM

## 2011-07-07 LAB — BASIC METABOLIC PANEL WITH GFR
BUN: 23 mg/dL (ref 6–23)
CO2: 29 meq/L (ref 19–32)
Calcium: 9 mg/dL (ref 8.4–10.5)
Chloride: 105 meq/L (ref 96–112)
Creatinine, Ser: 1.3 mg/dL (ref 0.4–1.5)
GFR: 60.27 mL/min
Glucose, Bld: 99 mg/dL (ref 70–99)
Potassium: 3.7 meq/L (ref 3.5–5.1)
Sodium: 142 meq/L (ref 135–145)

## 2011-07-07 LAB — HEPATIC FUNCTION PANEL
ALT: 49 U/L (ref 0–53)
AST: 33 U/L (ref 0–37)
Albumin: 4 g/dL (ref 3.5–5.2)
Alkaline Phosphatase: 62 U/L (ref 39–117)
Bilirubin, Direct: 0.1 mg/dL (ref 0.0–0.3)
Total Bilirubin: 0.4 mg/dL (ref 0.3–1.2)
Total Protein: 7.3 g/dL (ref 6.0–8.3)

## 2011-07-07 LAB — LIPID PANEL
LDL Cholesterol: 104 mg/dL — ABNORMAL HIGH (ref 0–99)
VLDL: 16.2 mg/dL (ref 0.0–40.0)

## 2011-07-11 ENCOUNTER — Telehealth: Payer: Self-pay

## 2011-07-11 NOTE — Telephone Encounter (Signed)
I spoke with the pt and gave him the results of his lab work.  The pt will increase Pravastatin to 80mg  daily.  I called a new Rx into [facility] and also refilled the pt's cardiac medications.  Per [facility] the pt is taking Amlodipine 10mg  daily and I will update our medication list.

## 2011-09-07 ENCOUNTER — Encounter: Payer: Self-pay | Admitting: Internal Medicine

## 2011-09-07 ENCOUNTER — Ambulatory Visit (INDEPENDENT_AMBULATORY_CARE_PROVIDER_SITE_OTHER): Payer: Medicaid Other | Admitting: *Deleted

## 2011-09-07 DIAGNOSIS — I428 Other cardiomyopathies: Secondary | ICD-10-CM

## 2011-09-07 DIAGNOSIS — I5022 Chronic systolic (congestive) heart failure: Secondary | ICD-10-CM

## 2011-09-07 LAB — ICD DEVICE OBSERVATION
BATTERY VOLTAGE: 3.2045 V
BRDY-0002RV: 40 {beats}/min
FVT: 0
PACEART VT: 0
TZAT-0001SLOWVT: 1
TZAT-0002SLOWVT: NEGATIVE
TZAT-0012FASTVT: 200 ms
TZAT-0018SLOWVT: NEGATIVE
TZAT-0019FASTVT: 8 V
TZAT-0019SLOWVT: 8 V
TZAT-0020FASTVT: 1.5 ms
TZST-0001FASTVT: 5
TZST-0001SLOWVT: 3
TZST-0001SLOWVT: 4
TZST-0001SLOWVT: 5
TZST-0002FASTVT: NEGATIVE
TZST-0002FASTVT: NEGATIVE
TZST-0002FASTVT: NEGATIVE
TZST-0002SLOWVT: NEGATIVE
TZST-0002SLOWVT: NEGATIVE
VENTRICULAR PACING ICD: 0.01 pct
VF: 0

## 2011-09-07 NOTE — Progress Notes (Signed)
ICD check with ICM 

## 2011-12-08 ENCOUNTER — Ambulatory Visit (INDEPENDENT_AMBULATORY_CARE_PROVIDER_SITE_OTHER): Payer: Medicaid Other | Admitting: Cardiology

## 2011-12-08 ENCOUNTER — Encounter: Payer: Self-pay | Admitting: Internal Medicine

## 2011-12-08 ENCOUNTER — Encounter: Payer: Self-pay | Admitting: Cardiology

## 2011-12-08 VITALS — BP 128/84 | HR 71 | Ht 75.0 in | Wt 294.1 lb

## 2011-12-08 DIAGNOSIS — I5022 Chronic systolic (congestive) heart failure: Secondary | ICD-10-CM

## 2011-12-08 DIAGNOSIS — Z95810 Presence of automatic (implantable) cardiac defibrillator: Secondary | ICD-10-CM

## 2011-12-08 NOTE — Patient Instructions (Signed)
Your physician recommends that you schedule a follow-up appointment in: 3 months with Dr Lovena Le.

## 2011-12-08 NOTE — Progress Notes (Signed)
ICD check only.  See PaceArt for details.

## 2011-12-09 LAB — ICD DEVICE OBSERVATION
BRDY-0002RV: 40 {beats}/min
DEV-0020ICD: NEGATIVE
RV LEAD AMPLITUDE: 11.9 mv
RV LEAD THRESHOLD: 0.75 V
TZAT-0001FASTVT: 1
TZAT-0001SLOWVT: 1
TZAT-0002FASTVT: NEGATIVE
TZAT-0012FASTVT: 200 ms
TZAT-0012SLOWVT: 200 ms
TZAT-0018FASTVT: NEGATIVE
TZAT-0020SLOWVT: 1.5 ms
TZON-0003SLOWVT: 360 ms
TZON-0003VSLOWVT: 450 ms
TZST-0001FASTVT: 4
TZST-0001FASTVT: 6
TZST-0001SLOWVT: 2
TZST-0001SLOWVT: 3
TZST-0001SLOWVT: 4
TZST-0001SLOWVT: 5
TZST-0002FASTVT: NEGATIVE
TZST-0002FASTVT: NEGATIVE
TZST-0002FASTVT: NEGATIVE
TZST-0002SLOWVT: NEGATIVE
TZST-0002SLOWVT: NEGATIVE

## 2012-01-12 ENCOUNTER — Ambulatory Visit (INDEPENDENT_AMBULATORY_CARE_PROVIDER_SITE_OTHER): Payer: Medicaid Other | Admitting: Cardiovascular Disease

## 2012-01-12 ENCOUNTER — Encounter: Payer: Self-pay | Admitting: Cardiovascular Disease

## 2012-01-12 VITALS — BP 150/86 | HR 62 | Ht 75.0 in | Wt 305.0 lb

## 2012-01-12 DIAGNOSIS — I2510 Atherosclerotic heart disease of native coronary artery without angina pectoris: Secondary | ICD-10-CM

## 2012-01-12 DIAGNOSIS — I5022 Chronic systolic (congestive) heart failure: Secondary | ICD-10-CM

## 2012-01-12 MED ORDER — SILDENAFIL CITRATE 100 MG PO TABS: 100.0000 mg | ORAL_TABLET | Freq: Every day | ORAL | Status: DC | PRN

## 2012-01-12 NOTE — Progress Notes (Signed)
HPI:  51 year old gentleman presenting for followup of malignant hypertension, coronary artery disease, and mixed cardiomyopathy. The patient underwent ICD implantation last year. He has a history of polysubstance abuse. He has been compliant with his medical program and returns today for routine followup.  The patient reports no recent changes in his symptoms. He's had no chest pain, dyspnea, palpitations, lightheadedness, or syncope. He denies leg swelling. He continues to feel fatigued after he takes his morning antihypertensive medications, but this is unchanged from his baseline.  Outpatient Encounter Prescriptions as of 01/12/2012  Medication Sig Dispense Refill  . albuterol (VENTOLIN HFA) 108 (90 BASE) MCG/ACT inhaler Inhale 2 puffs into the lungs. 2 puffs as needed       . ALPRAZolam (XANAX XR) 1 MG 24 hr tablet Take 1 mg by mouth every morning.        Marland Kitchen alprazolam (XANAX) 2 MG tablet Take 2 mg by mouth. Take one tablet by mouth four times a day       . amLODipine (NORVASC) 10 MG tablet Take 1 tablet (10 mg total) by mouth daily.      Marland Kitchen aspirin 81 MG EC tablet Take 81 mg by mouth daily.        . carvedilol (COREG) 25 MG tablet Take 25 mg by mouth 2 (two) times daily with a meal.       . ferrous sulfate 325 (65 FE) MG tablet Take 325 mg by mouth. Take one tablet by mouth two times a day       . furosemide (LASIX) 40 MG tablet Take 40 mg by mouth 2 (two) times daily.        . hydrALAZINE (APRESOLINE) 50 MG tablet Take 50 mg by mouth 3 (three) times daily.        . potassium chloride SA (K-DUR,KLOR-CON) 20 MEQ tablet Take 40 mEq by mouth daily.       . pravastatin (PRAVACHOL) 80 MG tablet Take 1 tablet (80 mg total) by mouth daily.  1 tablet  0  . traZODone (DESYREL) 50 MG tablet Take 50 mg by mouth at bedtime. As needed         Allergies  Allergen Reactions  . Codeine   . Hydrocodone-Acetaminophen     Past Medical History  Diagnosis Date  . Chronic systolic congestive heart  failure     Acute decompensation, LVEF less than 20%  . Hypertension   . History of cocaine abuse   . Bronchial asthma   . Benzodiazepine dependence   . Anxiety   . Depression   . Microcytic anemia   . Chronic kidney disease     Stage 3-4  . Cardiomyopathy     possible cocaine induced    ROS: Negative except as per HPI  BP 150/86  Pulse 62  Ht 6\' 3"  (1.905 m)  Wt 138.347 kg (305 lb)  BMI 38.12 kg/m2  PHYSICAL EXAM: Pt is alert and oriented, NAD HEENT: normal Neck: JVP - normal, carotids 2+= without bruits Lungs: CTA bilaterally CV: RRR without murmur or gallop Abd: soft, NT, Positive BS, no hepatomegaly Ext: no C/C/E, distal pulses intact and equal Skin: warm/dry no rash  EKG:  Normal sinus rhythm 62 beats per minute, nonspecific T wave abnormality. Left IVCD.  ASSESSMENT AND PLAN: 1. Essential hypertension, malignant. The patient's home blood pressure readings have been in a good range. He has always had elevated blood pressure when he first wakes up and this continues to be a  problem. However, soon as he takes his morning medicines he feels tired and has to sit down and rest for about 30 minutes. This is unchanged over time and I think he'll have to continue to tolerate this. I'm going to continue his same medical program. His kidney disease has improved since blood pressure control has come under better range. 2. Coronary artery disease, native vessel. No anginal symptoms. Continue same medical program which includes aspirin for antiplatelet therapy. He is also on pravastatin for cholesterol. 3. Hyperlipidemia. The patient's lipid panel has been at goal. He will be due for followup labs before his next office visit in 6 months. He is managed with pravastatin. 4. Erectile dysfunction. His Viagra was refilled today. 5. Status post ICD. He has an upcoming appointment with Dr. Lovena Le. There have been no ICD discharges.  For followup, I would like to see him back in 6  months.  [name] 01/12/2012 2:53 PM

## 2012-01-12 NOTE — Patient Instructions (Signed)
Your physician wants you to follow-up in: 6 MONTHS with Dr Burt Knack. You will receive a reminder letter in the mail two months in advance. If you don't receive a letter, please call our office to schedule the follow-up appointment.  Your physician recommends that you return for a FASTING LIPID, LIVER and BMP in 6 MONTHS--nothing to eat or drink after midnight, lab opens at 8:30  Your physician recommends that you continue on your current medications as directed. Please refer to the Current Medication list given to you today.

## 2012-02-06 ENCOUNTER — Other Ambulatory Visit: Payer: Self-pay | Admitting: Internal Medicine

## 2012-03-05 ENCOUNTER — Other Ambulatory Visit: Payer: Self-pay | Admitting: Internal Medicine

## 2012-03-13 ENCOUNTER — Other Ambulatory Visit: Payer: Self-pay

## 2012-03-13 ENCOUNTER — Encounter: Payer: Self-pay | Admitting: *Deleted

## 2012-03-13 MED ORDER — FUROSEMIDE 40 MG PO TABS: 40.0000 mg | ORAL_TABLET | Freq: Two times a day (BID) | ORAL | Status: DC

## 2012-03-20 ENCOUNTER — Ambulatory Visit (INDEPENDENT_AMBULATORY_CARE_PROVIDER_SITE_OTHER): Payer: Medicaid Other | Admitting: Internal Medicine

## 2012-03-20 ENCOUNTER — Encounter: Payer: Self-pay | Admitting: Internal Medicine

## 2012-03-20 VITALS — BP 119/77 | HR 63 | Resp 18 | Ht 75.0 in | Wt 300.0 lb

## 2012-03-20 DIAGNOSIS — I2589 Other forms of chronic ischemic heart disease: Secondary | ICD-10-CM

## 2012-03-20 DIAGNOSIS — I10 Essential (primary) hypertension: Secondary | ICD-10-CM

## 2012-03-20 DIAGNOSIS — I5022 Chronic systolic (congestive) heart failure: Secondary | ICD-10-CM

## 2012-03-20 DIAGNOSIS — Z95810 Presence of automatic (implantable) cardiac defibrillator: Secondary | ICD-10-CM

## 2012-03-20 LAB — ICD DEVICE OBSERVATION
BATTERY VOLTAGE: 3.1909 V
BRDY-0002RV: 40 {beats}/min
FVT: 0
PACEART VT: 0
TZAT-0001SLOWVT: 1
TZAT-0002FASTVT: NEGATIVE
TZAT-0002SLOWVT: NEGATIVE
TZAT-0012FASTVT: 200 ms
TZAT-0018FASTVT: NEGATIVE
TZAT-0018SLOWVT: NEGATIVE
TZAT-0019FASTVT: 8 V
TZON-0004VSLOWVT: 20
TZON-0005SLOWVT: 12
TZST-0001FASTVT: 3
TZST-0001FASTVT: 5
TZST-0001SLOWVT: 3
TZST-0001SLOWVT: 4
TZST-0001SLOWVT: 6
TZST-0002FASTVT: NEGATIVE
TZST-0002FASTVT: NEGATIVE
TZST-0002FASTVT: NEGATIVE
TZST-0002SLOWVT: NEGATIVE
TZST-0002SLOWVT: NEGATIVE
VENTRICULAR PACING ICD: 0.05 pct

## 2012-03-20 MED ORDER — FUROSEMIDE 40 MG PO TABS: ORAL_TABLET | ORAL | Status: DC

## 2012-03-20 NOTE — Patient Instructions (Signed)
Your physician wants you to follow-up in: 12 months with Dr Knox Saliva will receive a reminder letter in the mail two months in advance. If you don't receive a letter, please call our office to schedule the follow-up appointment.   Your physician has recommended you make the following change in your medication: Weigh yourself daily if weight over 300 take an extra Furosemide daily until weight goes under 300 then go back to original dose

## 2012-03-22 ENCOUNTER — Encounter: Payer: Self-pay | Admitting: Internal Medicine

## 2012-03-22 NOTE — Assessment & Plan Note (Signed)
His heart failure symptoms are class 2. Will continue his current meds. I have asked him to maintain a low sodium diet.

## 2012-03-22 NOTE — Progress Notes (Signed)
HPI Cody Ortega returns for followup. He is a pleasant 51 yo man with a h/o ICM, s/p MI, chronic systolic CHF, s/p ICD implant. The patient denies chest pain but does have chronic dyspnea. No peripheral edema. No ICD shocks. Allergies  Allergen Reactions  . Codeine   . Hydrocodone-Acetaminophen      Current Outpatient Prescriptions  Medication Sig Dispense Refill  . albuterol (VENTOLIN HFA) 108 (90 BASE) MCG/ACT inhaler Inhale 2 puffs into the lungs. 2 puffs as needed       . ALPRAZolam (XANAX XR) 1 MG 24 hr tablet Take 1 mg by mouth every morning.        Marland Kitchen alprazolam (XANAX) 2 MG tablet Take 2 mg by mouth. Take one tablet by mouth four times a day       . amLODipine (NORVASC) 10 MG tablet Take 1 tablet (10 mg total) by mouth daily.      Marland Kitchen aspirin 81 MG EC tablet Take 81 mg by mouth daily.        . carvedilol (COREG) 25 MG tablet Take 25 mg by mouth 2 (two) times daily with a meal.       . ferrous sulfate 325 (65 FE) MG tablet TAKE 1 TABLET BY MOUTH TWICE DAILY  60 tablet  0  . furosemide (LASIX) 40 MG tablet Take one by mouth twice daily and as needed for weight over 300 lbs  80 tablet  2  . hydrALAZINE (APRESOLINE) 50 MG tablet Take 50 mg by mouth 3 (three) times daily.        . potassium chloride SA (K-DUR,KLOR-CON) 20 MEQ tablet Take 40 mEq by mouth daily.       . pravastatin (PRAVACHOL) 80 MG tablet Take 1 tablet (80 mg total) by mouth daily.  1 tablet  0  . sildenafil (VIAGRA) 100 MG tablet Take 100 mg by mouth as needed.      . traZODone (DESYREL) 50 MG tablet Take 50 mg by mouth at bedtime. As needed          Past Medical History  Diagnosis Date  . Chronic systolic congestive heart failure     Acute decompensation, LVEF less than 20%  . Hypertension   . History of cocaine abuse   . Bronchial asthma   . Benzodiazepine dependence   . Anxiety   . Depression   . Microcytic anemia   . Chronic kidney disease     Stage 3-4  . Cardiomyopathy     possible cocaine induced     ROS:   All systems reviewed and negative except as noted in the HPI.   Past Surgical History  Procedure Date  . Tonsillectomy   . Transthoracic echocardiogram 10/2006, 12/2009     Family History  Problem Relation Age of Onset  . Breast cancer Mother   . Multiple myeloma Mother   . Prostate cancer Father   . Diabetes Father   . Cerebral palsy Daughter      History   Social History  . Marital Status: Divorced    Spouse Name: N/A    Number of Children: N/A  . Years of Education: N/A   Occupational History  . Unemployed     basketball referee in the past   Social History Main Topics  . Smoking status: Former Research scientist (life sciences)  . Smokeless tobacco: Not on file  . Alcohol Use: No  . Drug Use: Not on file     Comment: Reported history of cocaine abuse  off and on  . Sexually Active: Not on file   Other Topics Concern  . Not on file   Social History Narrative   Living with a son who is a 56 year old.  He is not  working, unemployed for 3 years.  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  Denied use of alcohol.  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        BP 119/77  Pulse 63  Resp 18  Ht 6\' 3"  (1.905 m)  Wt 300 lb (136.079 kg)  BMI 37.50 kg/m2  Physical Exam:  Well appearing middle aged man, NAD HEENT: Unremarkable Neck:  7 cm JVD, no thyromegally Lungs:  Clear with no wheezes, rales, or rhonchi HEART:  Regular rate rhythm, no murmurs, no rubs, no clicks Abd:  soft, positive bowel sounds, no organomegally, no rebound, no guarding Ext:  2 plus pulses, no edema, no cyanosis, no clubbing Skin:  No rashes no nodules Neuro:  CN II through XII intact, motor grossly intact  DEVICE  Normal device function.  See PaceArt for details.   Assess/Plan:

## 2012-03-22 NOTE — Assessment & Plan Note (Signed)
HIs medtronic ICD is working normally. Will recheck in several months.

## 2012-03-22 NOTE — Assessment & Plan Note (Signed)
His blood pressure is well controlled. He has been asked to reduce his sodium intake.

## 2012-04-22 ENCOUNTER — Encounter (HOSPITAL_COMMUNITY): Payer: Self-pay | Admitting: Emergency Medicine

## 2012-04-22 ENCOUNTER — Emergency Department (HOSPITAL_COMMUNITY)
Admission: EM | Admit: 2012-04-22 | Discharge: 2012-04-22 | Disposition: A | Discharge status: Home / Self Care | Payer: Medicaid Other | Attending: Emergency Medicine | Admitting: Emergency Medicine

## 2012-04-22 DIAGNOSIS — Z79899 Other long term (current) drug therapy: Secondary | ICD-10-CM | POA: Insufficient documentation

## 2012-04-22 DIAGNOSIS — J45909 Unspecified asthma, uncomplicated: Secondary | ICD-10-CM | POA: Insufficient documentation

## 2012-04-22 DIAGNOSIS — IMO0002 Reserved for concepts with insufficient information to code with codable children: Secondary | ICD-10-CM | POA: Insufficient documentation

## 2012-04-22 DIAGNOSIS — F1320 Sedative, hypnotic or anxiolytic dependence, uncomplicated: Secondary | ICD-10-CM | POA: Insufficient documentation

## 2012-04-22 DIAGNOSIS — I5022 Chronic systolic (congestive) heart failure: Secondary | ICD-10-CM | POA: Insufficient documentation

## 2012-04-22 DIAGNOSIS — IMO0001 Reserved for inherently not codable concepts without codable children: Secondary | ICD-10-CM | POA: Insufficient documentation

## 2012-04-22 DIAGNOSIS — S79919A Unspecified injury of unspecified hip, initial encounter: Secondary | ICD-10-CM | POA: Insufficient documentation

## 2012-04-22 DIAGNOSIS — S0990XA Unspecified injury of head, initial encounter: Secondary | ICD-10-CM | POA: Insufficient documentation

## 2012-04-22 DIAGNOSIS — W19XXXA Unspecified fall, initial encounter: Secondary | ICD-10-CM

## 2012-04-22 DIAGNOSIS — I129 Hypertensive chronic kidney disease with stage 1 through stage 4 chronic kidney disease, or unspecified chronic kidney disease: Secondary | ICD-10-CM | POA: Insufficient documentation

## 2012-04-22 DIAGNOSIS — Z87891 Personal history of nicotine dependence: Secondary | ICD-10-CM | POA: Insufficient documentation

## 2012-04-22 DIAGNOSIS — R0602 Shortness of breath: Secondary | ICD-10-CM | POA: Insufficient documentation

## 2012-04-22 DIAGNOSIS — Y939 Activity, unspecified: Secondary | ICD-10-CM | POA: Insufficient documentation

## 2012-04-22 DIAGNOSIS — Z7982 Long term (current) use of aspirin: Secondary | ICD-10-CM | POA: Insufficient documentation

## 2012-04-22 DIAGNOSIS — N184 Chronic kidney disease, stage 4 (severe): Secondary | ICD-10-CM | POA: Insufficient documentation

## 2012-04-22 DIAGNOSIS — S46909A Unspecified injury of unspecified muscle, fascia and tendon at shoulder and upper arm level, unspecified arm, initial encounter: Secondary | ICD-10-CM | POA: Insufficient documentation

## 2012-04-22 DIAGNOSIS — W010XXA Fall on same level from slipping, tripping and stumbling without subsequent striking against object, initial encounter: Secondary | ICD-10-CM | POA: Insufficient documentation

## 2012-04-22 DIAGNOSIS — F329 Major depressive disorder, single episode, unspecified: Secondary | ICD-10-CM | POA: Insufficient documentation

## 2012-04-22 DIAGNOSIS — F411 Generalized anxiety disorder: Secondary | ICD-10-CM | POA: Insufficient documentation

## 2012-04-22 DIAGNOSIS — F1410 Cocaine abuse, uncomplicated: Secondary | ICD-10-CM | POA: Insufficient documentation

## 2012-04-22 DIAGNOSIS — D509 Iron deficiency anemia, unspecified: Secondary | ICD-10-CM | POA: Insufficient documentation

## 2012-04-22 DIAGNOSIS — F3289 Other specified depressive episodes: Secondary | ICD-10-CM | POA: Insufficient documentation

## 2012-04-22 DIAGNOSIS — Y9289 Other specified places as the place of occurrence of the external cause: Secondary | ICD-10-CM | POA: Insufficient documentation

## 2012-04-22 DIAGNOSIS — S4980XA Other specified injuries of shoulder and upper arm, unspecified arm, initial encounter: Secondary | ICD-10-CM | POA: Insufficient documentation

## 2012-04-22 MED ORDER — METHOCARBAMOL 500 MG PO TABS: 500.0000 mg | ORAL_TABLET | Freq: Two times a day (BID) | ORAL | Status: DC

## 2012-04-22 MED ORDER — OXYCODONE-ACETAMINOPHEN 5-325 MG PO TABS: 1.0000 | ORAL_TABLET | Freq: Four times a day (QID) | ORAL | Status: DC | PRN

## 2012-04-22 NOTE — ED Provider Notes (Signed)
History     CSN: [id number]  Arrival date & time 04/22/12  2033   First MD Initiated Contact with Patient 04/22/12 2154      Chief Complaint  Patient presents with  . Fall    (Consider location/radiation/quality/duration/timing/severity/associated sxs/prior treatment) HPI  51 year old male with hx of CHF, polysubstance abuse, and chronic kidney disease presents for evaluation of a fall.  Pt reports he fell yesterday morning in a parking lot.  Mechanism is a slip and fall.  Denies hitting head or LOC but c/o headache, L shoulder, elbow and hip pain.  Sts initially after the fall he only has minimal pain, most significant to L hip.  Able to ambulate afterward, did took ibuprofen for pain which helps.  This AM when he work up he experienced increased pain to affected area, sharp, throbbing worsening with movement.  He has hx of anxiety and the pain worries him, causing him to have SOB.  No fever, cough, hemoptysis, dizziness or lightheadedness.  He decided to come to ER today for a check up.  Otherwise, denies bleeding, numbness, or weakness.  No SOB at this time.     Past Medical History  Diagnosis Date  . Chronic systolic congestive heart failure     Acute decompensation, LVEF less than 20%  . Hypertension   . History of cocaine abuse   . Bronchial asthma   . Benzodiazepine dependence   . Anxiety   . Depression   . Microcytic anemia   . Chronic kidney disease     Stage 3-4  . Cardiomyopathy     possible cocaine induced    Past Surgical History  Procedure Date  . Tonsillectomy   . Transthoracic echocardiogram 10/2006, 12/2009    Family History  Problem Relation Age of Onset  . Breast cancer Mother   . Multiple myeloma Mother   . Prostate cancer Father   . Diabetes Father   . Cerebral palsy Daughter     History  Substance Use Topics  . Smoking status: Former Research scientist (life sciences)  . Smokeless tobacco: Not on file  . Alcohol Use: Yes     Comment: occ      Review of Systems   Constitutional: Negative for fever.  HENT: Negative for neck pain.   Respiratory: Positive for shortness of breath.   Cardiovascular: Negative for chest pain.  Gastrointestinal: Negative for abdominal pain.  Musculoskeletal: Positive for myalgias. Negative for back pain.  Skin: Negative for rash and wound.  Neurological: Positive for headaches. Negative for numbness.    Allergies  Codeine and Hydrocodone-acetaminophen  Home Medications   Current Outpatient Rx  Name  Route  Sig  Dispense  Refill  . ALBUTEROL SULFATE HFA 108 (90 BASE) MCG/ACT IN AERS   Inhalation   Inhale 2 puffs into the lungs. 2 puffs as needed          . ALPRAZOLAM ER 1 MG PO TB24   Oral   Take 1 mg by mouth every morning.           Marland Kitchen ALPRAZOLAM 2 MG PO TABS   Oral   Take 2 mg by mouth. Take one tablet by mouth four times a day          . AMLODIPINE BESYLATE 10 MG PO TABS   Oral   Take 1 tablet (10 mg total) by mouth daily.         . ASPIRIN 81 MG PO TBEC   Oral   Take  81 mg by mouth daily.           Marland Kitchen CARVEDILOL 25 MG PO TABS   Oral   Take 25 mg by mouth 2 (two) times daily with a meal.          . FERROUS SULFATE 325 (65 FE) MG PO TABS      TAKE 1 TABLET BY MOUTH TWICE DAILY   60 tablet   0   . FUROSEMIDE 40 MG PO TABS      Take one by mouth twice daily and as needed for weight over 300 lbs   80 tablet   2   . HYDRALAZINE HCL 50 MG PO TABS   Oral   Take 50 mg by mouth 3 (three) times daily.           Marland Kitchen POTASSIUM CHLORIDE CRYS ER 20 MEQ PO TBCR   Oral   Take 40 mEq by mouth daily.          Marland Kitchen PRAVASTATIN SODIUM 80 MG PO TABS   Oral   Take 1 tablet (80 mg total) by mouth daily.   1 tablet   0   . SILDENAFIL CITRATE 100 MG PO TABS   Oral   Take 100 mg by mouth as needed.         . TRAZODONE HCL 50 MG PO TABS   Oral   Take 50 mg by mouth at bedtime. As needed            BP 128/82  Pulse 66  Temp 98.4 F (36.9 C) (Oral)  Resp 20  SpO2 100%  Physical  Exam  Nursing note and vitals reviewed. Constitutional: He is oriented to person, place, and time. He appears well-developed and well-nourished. No distress.  HENT:  Head: Normocephalic and atraumatic.       No hemotympanum, no septal hematoma, no malocclusion or midface tenderness.    Eyes: Conjunctivae normal are normal.  Neck: Normal range of motion. Neck supple.  Cardiovascular: Normal rate and regular rhythm.  Exam reveals no gallop and no friction rub.   No murmur heard. Pulmonary/Chest: Effort normal and breath sounds normal. No respiratory distress. He has no wheezes. He exhibits no tenderness.  Abdominal: Soft. There is no tenderness.  Musculoskeletal: He exhibits tenderness (L shoulder: mild tenderness to lateral aspect of shoulder, FROM, normal sensation.  L elbow: small abrasion noted, FROM, mild tenderness, L hip: mild tenderness to lateral aspect without deformity, FROM.  normal gait). He exhibits no edema.  Neurological: He is alert and oriented to person, place, and time.  Skin: Skin is warm. No rash noted.  Psychiatric: He has a normal mood and affect.    ED Course  Procedures (including critical care time)  Labs Reviewed - No data to display No results found.   No diagnosis found.  1. fall  MDM  Pt states he fell yesterday morning and is having headache, left hip pain, shoulder, and elbow pain Pt states the pain is making it hard to breathe Pt states he did not hit his head when he fell Pt states he slipped in a parking lot and fell   10:36 PM Had mechanical fall.  Examination unremarkable.  Ambulate without difficulty, doubt fx or dislocation.  RICE therapy discussed.          Domenic Moras, PA-C 04/22/12 2237

## 2012-04-22 NOTE — ED Notes (Signed)
Pt states he fell yesterday morning and is having headache, left hip pain, shoulder, and elbow pain  Pt states the pain is making it hard to breathe  Pt states he did not hit his head when he fell  Pt states he slipped in a parking lot and fell

## 2012-04-22 NOTE — ED Provider Notes (Signed)
Medical screening examination/treatment/procedure(s) were performed by non-physician practitioner and as supervising physician I was immediately available for consultation/collaboration.   Richarda Blade, MD 04/22/12 [phone]

## 2012-06-07 ENCOUNTER — Other Ambulatory Visit: Payer: Self-pay | Admitting: *Deleted

## 2012-06-07 MED ORDER — POTASSIUM CHLORIDE CRYS ER 20 MEQ PO TBCR: 40.0000 meq | EXTENDED_RELEASE_TABLET | Freq: Every day | ORAL | Status: DC

## 2012-06-20 ENCOUNTER — Other Ambulatory Visit: Payer: Self-pay | Admitting: Cardiology

## 2012-06-20 DIAGNOSIS — I5022 Chronic systolic (congestive) heart failure: Secondary | ICD-10-CM

## 2012-06-20 MED ORDER — FUROSEMIDE 40 MG PO TABS: ORAL_TABLET | ORAL | Status: DC

## 2012-06-22 ENCOUNTER — Encounter: Payer: Self-pay | Admitting: Internal Medicine

## 2012-06-22 ENCOUNTER — Encounter: Payer: Self-pay | Admitting: Cardiology

## 2012-06-22 ENCOUNTER — Ambulatory Visit (INDEPENDENT_AMBULATORY_CARE_PROVIDER_SITE_OTHER): Payer: Medicaid Other | Admitting: Cardiology

## 2012-06-22 DIAGNOSIS — I255 Ischemic cardiomyopathy: Secondary | ICD-10-CM

## 2012-06-22 DIAGNOSIS — I2589 Other forms of chronic ischemic heart disease: Secondary | ICD-10-CM

## 2012-06-22 DIAGNOSIS — I5022 Chronic systolic (congestive) heart failure: Secondary | ICD-10-CM

## 2012-06-22 DIAGNOSIS — Z95810 Presence of automatic (implantable) cardiac defibrillator: Secondary | ICD-10-CM

## 2012-06-22 LAB — ICD DEVICE OBSERVATION
BATTERY VOLTAGE: 3.16 V
BRDY-0002RV: 40 {beats}/min
PACEART VT: 0
TZAT-0002SLOWVT: NEGATIVE
TZAT-0012SLOWVT: 200 ms
TZAT-0018FASTVT: NEGATIVE
TZAT-0018SLOWVT: NEGATIVE
TZAT-0019FASTVT: 8 V
TZAT-0019SLOWVT: 8 V
TZAT-0020FASTVT: 1.5 ms
TZON-0005SLOWVT: 12
TZST-0001FASTVT: 3
TZST-0001FASTVT: 5
TZST-0001FASTVT: 6
TZST-0001SLOWVT: 5
TZST-0001SLOWVT: 6
TZST-0002FASTVT: NEGATIVE
TZST-0002SLOWVT: NEGATIVE
TZST-0002SLOWVT: NEGATIVE
TZST-0002SLOWVT: NEGATIVE
VENTRICULAR PACING ICD: 0 pct
VF: 0

## 2012-06-22 NOTE — Progress Notes (Signed)
ICD check/device clinic visit. See PaceArt report.

## 2012-07-10 ENCOUNTER — Ambulatory Visit (INDEPENDENT_AMBULATORY_CARE_PROVIDER_SITE_OTHER): Payer: Medicaid Other | Admitting: Cardiovascular Disease

## 2012-07-10 ENCOUNTER — Encounter: Payer: Self-pay | Admitting: Cardiovascular Disease

## 2012-07-10 VITALS — BP 145/90 | HR 70 | Ht 75.0 in | Wt 304.0 lb

## 2012-07-10 DIAGNOSIS — I2510 Atherosclerotic heart disease of native coronary artery without angina pectoris: Secondary | ICD-10-CM

## 2012-07-10 DIAGNOSIS — I5022 Chronic systolic (congestive) heart failure: Secondary | ICD-10-CM

## 2012-07-10 LAB — HEPATIC FUNCTION PANEL
ALT: 42 U/L (ref 0–53)
AST: 33 U/L (ref 0–37)
Total Protein: 6.8 g/dL (ref 6.0–8.3)

## 2012-07-10 LAB — BASIC METABOLIC PANEL
BUN: 15 mg/dL (ref 6–23)
CO2: 27 mEq/L (ref 19–32)
Chloride: 106 mEq/L (ref 96–112)
Creatinine, Ser: 1.4 mg/dL (ref 0.4–1.5)
Potassium: 3.5 mEq/L (ref 3.5–5.1)

## 2012-07-10 LAB — LIPID PANEL
Cholesterol: 136 mg/dL (ref 0–200)
Total CHOL/HDL Ratio: 5
Triglycerides: 119 mg/dL (ref 0.0–149.0)

## 2012-07-10 NOTE — Progress Notes (Signed)
HPI:  52 year old gentleman presenting for followup evaluation. The patient has a history of malignant hypertension with chronic kidney disease, coronary artery disease, and severe cardiomyopathy. He underwent ICD implantation in 2012. He has a history of polysubstance abuse but has been clean now for a few years. He takes his medications daily but does miss some of the medications that are dosed multiple times per day. His blood pressure has been better controlled at home. There is still some lability to his blood pressure readings with elevated readings in the mornings and low readings after he takes his morning medications.  He denies chest pain, chest pressure, orthopnea, PND, or leg swelling. He does have shortness of breath with activity. He had cold symptoms a few weeks ago and felt similar to the time when he was hospitalized with congestive heart failure. However, this cleared after a few days.  Outpatient Encounter Prescriptions as of 07/10/2012  Medication Sig Dispense Refill  . albuterol (VENTOLIN HFA) 108 (90 BASE) MCG/ACT inhaler Inhale 2 puffs into the lungs. 2 puffs as needed       . ALPRAZolam (XANAX XR) 1 MG 24 hr tablet Take 1 mg by mouth every morning.        Marland Kitchen alprazolam (XANAX) 2 MG tablet Take 2 mg by mouth. Take one tablet by mouth four times a day       . amLODipine (NORVASC) 10 MG tablet Take 1 tablet (10 mg total) by mouth daily.      Marland Kitchen aspirin 81 MG EC tablet Take 81 mg by mouth daily.        . carvedilol (COREG) 25 MG tablet Take 25 mg by mouth 2 (two) times daily with a meal.       . diclofenac (VOLTAREN) 50 MG EC tablet Take 50 mg by mouth 2 (two) times daily as needed.      . ferrous sulfate 325 (65 FE) MG tablet TAKE 1 TABLET BY MOUTH TWICE DAILY  60 tablet  0  . furosemide (LASIX) 40 MG tablet Take one by mouth twice daily and as needed for weight over 300 lbs  80 tablet  2  . hydrALAZINE (APRESOLINE) 50 MG tablet Take 50 mg by mouth 3 (three) times daily.          . potassium chloride SA (K-DUR,KLOR-CON) 20 MEQ tablet Take 2 tablets (40 mEq total) by mouth daily.  60 tablet  3  . pravastatin (PRAVACHOL) 80 MG tablet Take 1 tablet (80 mg total) by mouth daily.  1 tablet  0  . sildenafil (VIAGRA) 100 MG tablet Take 100 mg by mouth as needed.      . traZODone (DESYREL) 50 MG tablet Take 50 mg by mouth at bedtime. As needed        No facility-administered encounter medications on file as of 07/10/2012.    Allergies  Allergen Reactions  . Codeine   . Hydrocodone-Acetaminophen     Past Medical History  Diagnosis Date  . Chronic systolic congestive heart failure     Acute decompensation, LVEF less than 20%  . Hypertension   . History of cocaine abuse   . Bronchial asthma   . Benzodiazepine dependence   . Anxiety   . Depression   . Microcytic anemia   . Chronic kidney disease     Stage 3-4  . Cardiomyopathy     possible cocaine induced    ROS: Negative except as per HPI  BP 145/90  Pulse 70  Ht 6\' 3"  (1.905 m)  Wt 137.893 kg (304 lb)  BMI 38 kg/m2  SpO2 96%  PHYSICAL EXAM: Pt is alert and oriented, NAD HEENT: normal Neck: JVP - normal, carotids 2+= without bruits Lungs: CTA bilaterally CV: RRR without murmur or gallop Abd: soft, NT, Positive BS, no hepatomegaly Ext: no C/C/E, distal pulses intact and equal Skin: warm/dry no rash  EKG:  Normal sinus rhythm 70 beats per minute, nonspecific T wave abnormality  ASSESSMENT AND PLAN: 1. Chronic systolic heart failure secondary to underlying nonischemic cardiomyopathy. The patient appears euvolemic today. His medications were reviewed and he will continue on the same program. His renal function has not allowed for the addition of an ACE or ARB.  2. Coronary artery disease, native vessel. Will continue his same medical program. He is on aspirin for antiplatelet therapy and a statin drug for lipid lowering.  3. Obesity. We had a lengthy discussion about his need to initiate a regular  exercise program and focus on weight loss and dietary modification.  4. Hypertension, malignant. His blood pressure is controlled on his current medical program.  5. Chronic kidney disease, stage III. Repeat labs were drawn today. May need to consider another trial of an ARB if his renal function remains stable.  [name] 07/10/2012 12:15 PM

## 2012-07-10 NOTE — Patient Instructions (Addendum)
Your physician wants you to follow-up in: 6 MONTHS with Dr Burt Knack.  You will receive a reminder letter in the mail two months in advance. If you don't receive a letter, please call our office to schedule the follow-up appointment.  Your physician recommends that you continue on your current medications as directed. Please refer to the Current Medication list given to you today.  Your physician recommends that you have lab work today: LIPID, LIVER and BMP

## 2012-07-31 ENCOUNTER — Other Ambulatory Visit: Payer: Self-pay | Admitting: Cardiovascular Disease

## 2012-08-03 ENCOUNTER — Other Ambulatory Visit: Payer: Self-pay | Admitting: Cardiovascular Disease

## 2012-08-07 ENCOUNTER — Other Ambulatory Visit: Payer: Self-pay | Admitting: *Deleted

## 2012-09-21 ENCOUNTER — Ambulatory Visit (INDEPENDENT_AMBULATORY_CARE_PROVIDER_SITE_OTHER): Payer: Medicaid Other | Admitting: Cardiology

## 2012-09-21 ENCOUNTER — Encounter: Payer: Self-pay | Admitting: Cardiology

## 2012-09-21 VITALS — BP 130/95 | HR 68 | Ht 75.0 in | Wt 305.1 lb

## 2012-09-21 DIAGNOSIS — I255 Ischemic cardiomyopathy: Secondary | ICD-10-CM

## 2012-09-21 DIAGNOSIS — Z95810 Presence of automatic (implantable) cardiac defibrillator: Secondary | ICD-10-CM

## 2012-09-21 DIAGNOSIS — I2589 Other forms of chronic ischemic heart disease: Secondary | ICD-10-CM

## 2012-09-21 NOTE — Patient Instructions (Addendum)
Remote monitoring is used to monitor your Pacemaker of ICD from home. This monitoring reduces the number of office visits required to check your device to one time per year. It allows [place] to keep an eye on the functioning of your device to ensure it is working properly. You are scheduled for a device check from home on 12/24/2012. You may send your transmission at any time that day. If you have a wireless device, the transmission will be sent automatically. After your physician reviews your transmission, you will receive a postcard with your next transmission date.  Your physician wants you to follow-up in: 6 months with Dr. Lovena Le or Nino Parsley will receive a reminder letter in the mail two months in advance. If you don't receive a letter, please call our office to schedule the follow-up appointment.

## 2012-09-25 LAB — ICD DEVICE OBSERVATION
DEV-0020ICD: NEGATIVE
FVT: 0
PACEART VT: 0
RV LEAD AMPLITUDE: 13.3 mv
RV LEAD IMPEDENCE ICD: 380 Ohm
RV LEAD THRESHOLD: 1 V
TZAT-0001FASTVT: 1
TZAT-0002FASTVT: NEGATIVE
TZAT-0012SLOWVT: 200 ms
TZAT-0019FASTVT: 8 V
TZAT-0019SLOWVT: 8 V
TZAT-0020SLOWVT: 1.5 ms
TZON-0004SLOWVT: 16
TZON-0005SLOWVT: 12
TZST-0001FASTVT: 3
TZST-0001FASTVT: 4
TZST-0001FASTVT: 5
TZST-0001SLOWVT: 5
TZST-0002FASTVT: NEGATIVE
TZST-0002FASTVT: NEGATIVE
TZST-0002FASTVT: NEGATIVE
TZST-0002SLOWVT: NEGATIVE
TZST-0002SLOWVT: NEGATIVE
TZST-0002SLOWVT: NEGATIVE

## 2012-09-25 NOTE — Progress Notes (Signed)
ICD check in device clinic. Cody Ortega has no complaints. Normal ICD function. No programming changes made. See PaceArt report.

## 2012-10-17 ENCOUNTER — Encounter: Payer: Self-pay | Admitting: Internal Medicine

## 2012-11-10 ENCOUNTER — Other Ambulatory Visit: Payer: Self-pay | Admitting: Internal Medicine

## 2012-12-23 ENCOUNTER — Other Ambulatory Visit: Payer: Self-pay | Admitting: Internal Medicine

## 2012-12-24 ENCOUNTER — Ambulatory Visit (INDEPENDENT_AMBULATORY_CARE_PROVIDER_SITE_OTHER): Payer: Medicaid Other | Admitting: *Deleted

## 2012-12-24 ENCOUNTER — Encounter: Payer: Medicaid Other | Admitting: *Deleted

## 2012-12-24 DIAGNOSIS — I2510 Atherosclerotic heart disease of native coronary artery without angina pectoris: Secondary | ICD-10-CM

## 2012-12-24 DIAGNOSIS — Z95810 Presence of automatic (implantable) cardiac defibrillator: Secondary | ICD-10-CM

## 2012-12-24 DIAGNOSIS — I5022 Chronic systolic (congestive) heart failure: Secondary | ICD-10-CM

## 2012-12-24 LAB — ICD DEVICE OBSERVATION
DEV-0020ICD: NEGATIVE
PACEART VT: 0
TOT-0001: 1
TOT-0002: 0
TOT-0006: 20120806000000
TZAT-0002FASTVT: NEGATIVE
TZAT-0012SLOWVT: 200 ms
TZAT-0018FASTVT: NEGATIVE
TZAT-0018SLOWVT: NEGATIVE
TZAT-0019FASTVT: 8 V
TZAT-0019SLOWVT: 8 V
TZAT-0020FASTVT: 1.5 ms
TZON-0003SLOWVT: 360 ms
TZON-0003VSLOWVT: 450 ms
TZON-0004SLOWVT: 16
TZON-0004VSLOWVT: 20
TZON-0005SLOWVT: 12
TZST-0001FASTVT: 2
TZST-0001FASTVT: 3
TZST-0001FASTVT: 6
TZST-0001SLOWVT: 2
TZST-0001SLOWVT: 4
TZST-0001SLOWVT: 6
TZST-0002FASTVT: NEGATIVE
TZST-0002FASTVT: NEGATIVE
TZST-0002SLOWVT: NEGATIVE
TZST-0002SLOWVT: NEGATIVE
TZST-0002SLOWVT: NEGATIVE
TZST-0002SLOWVT: NEGATIVE

## 2012-12-24 NOTE — Progress Notes (Signed)
icd check in clinic. Normal device function. 1 nst episode lasting 5 beats. Battery voltage 3.14 V. ROV in 3 mths w/GT.

## 2013-01-02 ENCOUNTER — Encounter: Payer: Self-pay | Admitting: Cardiovascular Disease

## 2013-01-02 ENCOUNTER — Ambulatory Visit (INDEPENDENT_AMBULATORY_CARE_PROVIDER_SITE_OTHER): Payer: Medicaid Other | Admitting: Cardiovascular Disease

## 2013-01-02 VITALS — BP 114/86 | HR 66 | Ht 75.0 in | Wt 304.0 lb

## 2013-01-02 DIAGNOSIS — E785 Hyperlipidemia, unspecified: Secondary | ICD-10-CM

## 2013-01-02 DIAGNOSIS — I5022 Chronic systolic (congestive) heart failure: Secondary | ICD-10-CM

## 2013-01-02 DIAGNOSIS — I10 Essential (primary) hypertension: Secondary | ICD-10-CM

## 2013-01-02 DIAGNOSIS — R0602 Shortness of breath: Secondary | ICD-10-CM

## 2013-01-02 DIAGNOSIS — I2510 Atherosclerotic heart disease of native coronary artery without angina pectoris: Secondary | ICD-10-CM

## 2013-01-02 LAB — BASIC METABOLIC PANEL
BUN: 20 mg/dL (ref 6–23)
Chloride: 105 mEq/L (ref 96–112)
Creatinine, Ser: 1.6 mg/dL — ABNORMAL HIGH (ref 0.4–1.5)
Glucose, Bld: 103 mg/dL — ABNORMAL HIGH (ref 70–99)
Potassium: 3.5 mEq/L (ref 3.5–5.1)

## 2013-01-02 LAB — BRAIN NATRIURETIC PEPTIDE: Pro B Natriuretic peptide (BNP): 39 pg/mL (ref 0.0–100.0)

## 2013-01-02 NOTE — Progress Notes (Signed)
HPI:  52 year-old gentleman presenting for followup evaluation. He's followed for severe nonischemic cardiomyopathy, CKD, and CAD. 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 Complains of generalized fatigue, but inconsistent symptoms. Some days he feels well and other days he is weak with no energy. Denies chest pain or pressure. Shortness of breath with exertion is unchanged. He denies edema, orthopnea, or PND. He's been reasonably compliant with meds, misses his multidose meds at times. No lightheadedness or near-syncope.  Outpatient Encounter Prescriptions as of 01/02/2013  Medication Sig Dispense Refill  . albuterol (VENTOLIN HFA) 108 (90 BASE) MCG/ACT inhaler Inhale 2 puffs into the lungs. 2 puffs as needed       . alprazolam (XANAX) 2 MG tablet Take 2 mg by mouth. Take one tablet by mouth four times a day       . amLODipine (NORVASC) 10 MG tablet TAKE 1 TABLET BY MOUTH EVERY DAY  30 tablet  5  . aspirin 81 MG EC tablet Take 81 mg by mouth daily.        . carvedilol (COREG) 25 MG tablet TAKE 1 TABLET BY MOUTH TWICE DAILY  60 tablet  11  . ferrous sulfate 325 (65 FE) MG tablet TAKE 1 TABLET BY MOUTH TWICE DAILY  60 tablet  0  . furosemide (LASIX) 40 MG tablet TAKE 1 TABLET BY MOUTH TWICE DAILY AND AS NEEDED FOR WEIGHT OVER 300LBS  80 tablet  0  . hydrALAZINE (APRESOLINE) 50 MG tablet TAKE 1 TABLET BY MOUTH THREE TIMES DAILY AS NEEDED FOR BLOOD PRESSURE  90 tablet  10  . IBUPROFEN PO Take by mouth. TAKE 800MG  AS NEEDED FOR PAIN      . potassium chloride SA (K-DUR,KLOR-CON) 20 MEQ tablet TAKE 2 TABLETS BY MOUTH DAILY  60 tablet  3  . pravastatin (PRAVACHOL) 80 MG tablet TAKE 1 TABLET BY MOUTH EVERY NIGHT AT BEDTIME  30 tablet  11  . traZODone (DESYREL) 50 MG tablet Take 50 mg by mouth at bedtime. As needed       . sildenafil (VIAGRA) 100 MG tablet Take 100 mg by mouth as needed.      . [DISCONTINUED] ALPRAZolam (XANAX XR) 1 MG 24 hr tablet Take 1 mg by mouth every morning.        . [DISCONTINUED] diclofenac  (VOLTAREN) 50 MG EC tablet Take 50 mg by mouth 2 (two) times daily as needed.       No facility-administered encounter medications on file as of 01/02/2013.    Allergies  Allergen Reactions  . Codeine   . Hydrocodone-Acetaminophen     Past Medical History  Diagnosis Date  . Chronic systolic congestive heart failure     Acute decompensation, LVEF less than 20%  . Hypertension   . History of cocaine abuse   . Bronchial asthma   . Benzodiazepine dependence   . Anxiety   . Depression   . Microcytic anemia   . Chronic kidney disease     Stage 3-4  . Cardiomyopathy     possible cocaine induced   ROS: positive for back and knee pain, generalized weakness. Otherwise negative except as per HPI  BP 114/86  Pulse 66  Ht 6\' 3"  (1.905 m)  Wt 304 lb (137.893 kg)  BMI 38 kg/m2  SpO2 98%  PHYSICAL EXAM: Pt is alert and oriented, NAD HEENT: normal Neck: JVP - normal, carotids 2+= without bruits Lungs: CTA bilaterally CV: RRR without murmur or gallop Abd: soft, NT, Positive BS, no hepatomegaly  Ext: no C/C/E, distal pulses intact and equal Skin: warm/dry no rash  EKG:  Possible ectopic atrial rhythm, heart rate 66 bpm, nonspecific ST-T abnormality  ASSESSMENT AND PLAN: 1. Chronic systolic heart failure. Will continue same Rx for now. Check BMET/BNP today. Repeat 2D echo in setting fatigue, continued DOE.  2. CAD, native vessel. No chest pain. On appropriate med Rx. Continue ASA, statin drug.  3. Malignant HTN. On multidrug Rx. Continue same. Appears well-controlled at present.  4. Ectopic atrial rhythm. Does not appear to be symptomatic. No lightheadedness, palpitations, or presyncope. Will follow.  Cody Ortega 01/02/2013 2:28 PM

## 2013-01-02 NOTE — Patient Instructions (Signed)
Your physician recommends that you have lab work today: BMP and BNP  Your physician has requested that you have an echocardiogram. Echocardiography is a painless test that uses sound waves to create images of your heart. It provides your doctor with information about the size and shape of your heart and how well your heart's chambers and valves are working. This procedure takes approximately one hour. There are no restrictions for this procedure.  Your physician wants you to follow-up in: 6 MONTHS with Dr Burt Knack.  You will receive a reminder letter in the mail two months in advance. If you don't receive a letter, please call our office to schedule the follow-up appointment.

## 2013-01-18 ENCOUNTER — Ambulatory Visit (HOSPITAL_COMMUNITY): Payer: Medicaid Other | Attending: Cardiovascular Disease | Admitting: Radiology

## 2013-01-18 DIAGNOSIS — R0602 Shortness of breath: Secondary | ICD-10-CM

## 2013-01-18 DIAGNOSIS — I5022 Chronic systolic (congestive) heart failure: Secondary | ICD-10-CM

## 2013-01-18 DIAGNOSIS — I079 Rheumatic tricuspid valve disease, unspecified: Secondary | ICD-10-CM | POA: Insufficient documentation

## 2013-01-18 DIAGNOSIS — I2510 Atherosclerotic heart disease of native coronary artery without angina pectoris: Secondary | ICD-10-CM

## 2013-01-18 NOTE — Progress Notes (Signed)
Echocardiogram performed.  

## 2013-01-25 ENCOUNTER — Encounter: Payer: Self-pay | Admitting: Cardiovascular Disease

## 2013-01-25 NOTE — Telephone Encounter (Signed)
New Problem  Pt returned call about echo results.

## 2013-01-25 NOTE — Telephone Encounter (Signed)
This encounter was created in error - please disregard.

## 2013-02-04 ENCOUNTER — Encounter: Payer: Self-pay | Admitting: Internal Medicine

## 2013-02-12 ENCOUNTER — Telehealth: Payer: Self-pay | Admitting: Cardiovascular Disease

## 2013-02-12 NOTE — Telephone Encounter (Signed)
New problem:  Pt states he has CHF. Pt states he has plans to travel to [place] and stay at an altitude above 6,000 ft. Pt wants to know if his doctor thinks it is medically safe for him to be traveling to high altitudes with his heart condition. Please advise

## 2013-02-12 NOTE — Telephone Encounter (Signed)
I will forward this message to Dr Burt Knack for review and recommendations.

## 2013-02-19 NOTE — Telephone Encounter (Signed)
Follow up  Pt states he has CHF. Pt states he has plans to travel to [place] and stay at an altitude above 6,000 ft. Pt wants to know if his doctor thinks it is medically safe for him to be traveling to high altitudes with his heart condition. Please advise

## 2013-02-19 NOTE — Telephone Encounter (Signed)
Ok to travel as long as feeling ok. Needs to make sure to take his meds and avoid salt while traveling.

## 2013-02-20 NOTE — Telephone Encounter (Signed)
Pt aware of Dr Antionette Char comments and would like a copy for his records.  I will place this documentation at the front desk for the pt to pick up.

## 2013-02-20 NOTE — Telephone Encounter (Signed)
Left message on machine for pt to contact the office.   

## 2013-03-19 ENCOUNTER — Encounter: Payer: Self-pay | Admitting: Internal Medicine

## 2013-03-19 ENCOUNTER — Ambulatory Visit (INDEPENDENT_AMBULATORY_CARE_PROVIDER_SITE_OTHER): Payer: Medicaid Other | Admitting: Internal Medicine

## 2013-03-19 VITALS — BP 158/98 | HR 90 | Ht 75.0 in | Wt 308.1 lb

## 2013-03-19 DIAGNOSIS — I5022 Chronic systolic (congestive) heart failure: Secondary | ICD-10-CM

## 2013-03-19 DIAGNOSIS — Z95810 Presence of automatic (implantable) cardiac defibrillator: Secondary | ICD-10-CM

## 2013-03-19 LAB — MDC_IDC_ENUM_SESS_TYPE_INCLINIC
Battery Voltage: 3.16 V
Brady Statistic RV Percent Paced: 0.07 %
HighPow Impedance: 19 Ohm
HighPow Impedance: 43 Ohm
HighPow Impedance: 58 Ohm
Lead Channel Impedance Value: 342 Ohm
Lead Channel Setting Pacing Amplitude: 2.5 V
Lead Channel Setting Pacing Pulse Width: 0.4 ms
Lead Channel Setting Sensing Sensitivity: 0.3 mV
Zone Setting Detection Interval: 360 ms
Zone Setting Detection Interval: 450 ms

## 2013-03-19 NOTE — Patient Instructions (Addendum)
Your physician wants you to follow-up in: 12 months with Dr Knox Saliva will receive a reminder letter in the mail two months in advance. If you don't receive a letter, please call our office to schedule the follow-up appointment.  Remote monitoring is used to monitor your Pacemaker of ICD from home. This monitoring reduces the number of office visits required to check your device to one time per year. It allows [place] to keep an eye on the functioning of your device to ensure it is working properly. You are scheduled for a device check from home on 06/20/12. You may send your transmission at any time that day. If you have a wireless device, the transmission will be sent automatically. After your physician reviews your transmission, you will receive a postcard with your next transmission date.

## 2013-03-19 NOTE — Progress Notes (Signed)
HPI [name] returns for followup. He is a pleasant 52 yo man with a h/o ICM, s/p MI, chronic systolic CHF, s/p ICD implant. The patient denies chest pain but does have chronic dyspnea. No peripheral edema. No ICD shocks. He states that when he tries to do any strenuous activity, he gets short of breath and has to stop what he is doing. His heart failure symptoms are class II. He does admit to some dietary indiscretion and intermittent weight gain. He has remained on twice daily Lasix with modest control of his tendency for volume overload. Interestingly enough, his ejection fraction by echo 2 months ago was 45%, when previously it had been 20-25%. Allergies  Allergen Reactions  . Codeine   . Hydrocodone-Acetaminophen      Current Outpatient Prescriptions  Medication Sig Dispense Refill  . albuterol (PROVENTIL) (2.5 MG/3ML) 0.083% nebulizer solution Take 2.5 mg by nebulization every 6 (six) hours as needed for wheezing or shortness of breath.      Marland Kitchen albuterol (VENTOLIN HFA) 108 (90 BASE) MCG/ACT inhaler 2 puffs as needed      . alprazolam (XANAX) 2 MG tablet Take one tablet by mouth four times a day      . amLODipine (NORVASC) 10 MG tablet TAKE 1 TABLET BY MOUTH EVERY DAY  30 tablet  5  . aspirin 81 MG EC tablet Take 81 mg by mouth daily.        . carvedilol (COREG) 25 MG tablet TAKE 1 TABLET BY MOUTH TWICE DAILY  60 tablet  11  . ferrous sulfate 325 (65 FE) MG tablet TAKE 1 TABLET BY MOUTH TWICE DAILY  60 tablet  0  . furosemide (LASIX) 40 MG tablet TAKE 1 TABLET BY MOUTH TWICE DAILY AND AS NEEDED FOR WEIGHT OVER 300LBS  80 tablet  0  . hydrALAZINE (APRESOLINE) 50 MG tablet TAKE 1 TABLET BY MOUTH THREE TIMES DAILY AS NEEDED FOR BLOOD PRESSURE  90 tablet  10  . IBUPROFEN PO TAKE 800MG  AS NEEDED FOR PAIN      . potassium chloride SA (K-DUR,KLOR-CON) 20 MEQ tablet TAKE 2 TABLETS BY MOUTH DAILY  60 tablet  3  . pravastatin (PRAVACHOL) 80 MG tablet TAKE 1 TABLET BY MOUTH EVERY NIGHT AT BEDTIME   30 tablet  11  . sildenafil (VIAGRA) 100 MG tablet Take 100 mg by mouth as needed.      . traZODone (DESYREL) 50 MG tablet Take 50 mg by mouth at bedtime. As needed        No current facility-administered medications for this visit.     Past Medical History  Diagnosis Date  . Chronic systolic congestive heart failure     Acute decompensation, LVEF less than 20%  . Hypertension   . History of cocaine abuse   . Bronchial asthma   . Benzodiazepine dependence   . Anxiety   . Depression   . Microcytic anemia   . Chronic kidney disease     Stage 3-4  . Cardiomyopathy     possible cocaine induced    ROS:   All systems reviewed and negative except as noted in the HPI.   Past Surgical History  Procedure Laterality Date  . Tonsillectomy    . Transthoracic echocardiogram  10/2006, 12/2009     Family History  Problem Relation Age of Onset  . Breast cancer Mother   . Multiple myeloma Mother   . Prostate cancer Father   . Diabetes Father   . Cerebral  palsy Daughter      History   Social History  . Marital Status: Divorced    Spouse Name: N/A    Number of Children: N/A  . Years of Education: N/A   Occupational History  . Unemployed     basketball referee in the past   Social History Main Topics  . Smoking status: Former Research scientist (life sciences)  . Smokeless tobacco: Not on file  . Alcohol Use: Yes     Comment: occ  . Drug Use: No     Comment: Reported history of cocaine abuse off and on  . Sexual Activity: Not on file   Other Topics Concern  . Not on file   Social History Narrative   Living with a son who is a 63 year old.  He is not     working, unemployed for 3 years.  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  Denied use of alcohol.  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           BP 158/98  Pulse 90  Ht 6\' 3"  (1.905 m)  Wt 308 lb 1.9 oz (139.762 kg)  BMI 38.51 kg/m2  SpO2 96%  Physical Exam:  Well appearing middle aged man,  NAD HEENT: Unremarkable Neck:  6 cm JVD, no thyromegally Lungs:  Clear with no wheezes, rales, or rhonchi HEART:  Regular rate rhythm, no murmurs, no rubs, no clicks Abd:  soft, positive bowel sounds, no organomegally, no rebound, no guarding Ext:  2 plus pulses, no edema, no cyanosis, no clubbing Skin:  No rashes no nodules Neuro:  CN II through XII intact, motor grossly intact  DEVICE  Normal device function.  See PaceArt for details.   Assess/Plan:

## 2013-03-20 ENCOUNTER — Other Ambulatory Visit: Payer: Self-pay | Admitting: Internal Medicine

## 2013-03-23 ENCOUNTER — Other Ambulatory Visit: Payer: Self-pay | Admitting: Cardiovascular Disease

## 2013-04-07 ENCOUNTER — Ambulatory Visit (HOSPITAL_BASED_OUTPATIENT_CLINIC_OR_DEPARTMENT_OTHER): Payer: Medicaid Other | Attending: Internal Medicine | Admitting: Radiology

## 2013-04-07 VITALS — Ht 75.0 in | Wt 308.0 lb

## 2013-04-07 DIAGNOSIS — G4733 Obstructive sleep apnea (adult) (pediatric): Secondary | ICD-10-CM

## 2013-04-14 DIAGNOSIS — G4733 Obstructive sleep apnea (adult) (pediatric): Secondary | ICD-10-CM

## 2013-04-14 NOTE — Sleep Study (Signed)
   NAME: Cody Ortega DATE OF BIRTH:  [birth year] MEDICAL RECORD NUMBER [id number]  LOCATION: [facility]  PHYSICIAN: YOUNG,CLINTON D  DATE OF STUDY: 04/07/2013  SLEEP STUDY TYPE: Nocturnal Polysomnogram               REFERRING PHYSICIAN: Elwyn Reach, MD  INDICATION FOR STUDY: Hypersomnia with sleep apnea  EPWORTH SLEEPINESS SCORE:   10/24 HEIGHT: 6\' 3"  (190.5 cm)  WEIGHT: 139.708 kg (308 lb)    Body mass index is 38.5 kg/(m^2).  NECK SIZE: 19.5 in.  MEDICATIONS: Charted for review  SLEEP ARCHITECTURE: Total sleep time 252.5 minutes with sleep efficiency 54.8%. Stage I was 7.7%, stage II 92.3%, stage III and REM were absent. Sleep latency 56.5 minutes, awake after sleep onset 60 minutes, arousal index 20.4. Bedtime medication: Pravachol,  Apresoline, aspirin, albuterol  RESPIRATORY DATA: Apnea hypopnea index (AHI) 13.8 per hour. A total of 58 events were scored including 17 obstructive apneas, 1 mixed apnea, 40 hypopneas. Events were more common while  nonsupine. This was a diagnostic NPSG protocol without CPAP as ordered.  OXYGEN DATA: Moderate snoring with oxygen desaturation to a nadir of 81%. At 12:10 AM, the patient was placed on 1 L nasal oxygen and subsequently increased to 2 L of supplemental nasal oxygen because of persistent oxygen saturation below 90%.  CARDIAC DATA: Sinus rhythm with pacemaker, PVCs.  MOVEMENT/PARASOMNIA: A total of 133 limb jerks were counted of which only 8 were associated with arousal or awakening for periodic limb movement with arousal index of 1.9 per hour. Bathroom x2.  IMPRESSION/ RECOMMENDATION:   1) Mild obstructive sleep apnea/hypoxia syndrome, AHI 13.8 per hour with events in all positions. Moderate snoring with oxygen desaturation to a nadir of 81% on room air. Because of persistent oxygen desaturation, supplemental oxygen was added at 2 L per minute by nasal prongs to maintain a mean oxygen saturation through the  study of 88.9% on 2 L of oxygen. 2) The study was ordered as a diagnostic NPSG protocol without CPAP. Consider return for a diagnostic CPAP titration study if appropriate. Treatment of sleep apnea may remove requirement for supplemental oxygen.  Signed Baird Lyons M.D. Deneise Lever Diplomate, American Board of Sleep Medicine  ELECTRONICALLY SIGNED ON:  04/14/2013, 2:19 PM [place] PH: [phone]   FX: [phone] [place]

## 2013-04-16 ENCOUNTER — Other Ambulatory Visit: Payer: Self-pay | Admitting: Internal Medicine

## 2013-05-05 ENCOUNTER — Other Ambulatory Visit: Payer: Self-pay | Admitting: Cardiovascular Disease

## 2013-05-13 ENCOUNTER — Other Ambulatory Visit: Payer: Self-pay | Admitting: Internal Medicine

## 2013-05-30 ENCOUNTER — Ambulatory Visit (HOSPITAL_BASED_OUTPATIENT_CLINIC_OR_DEPARTMENT_OTHER): Payer: Medicaid Other | Attending: Internal Medicine | Admitting: Radiology

## 2013-05-31 NOTE — Sleep Study (Signed)
[name] was unable to do CPAP Study due to a panic attack.  [name] were place on CPAP and BIPAP which he was unable to tolerate.  [name] is willing to come back for a desensitization.  Mr Cody Ortega is a mouth breather and the mask he prerfer to used is [employer]  FFM.    [name] tried several  FFM and he liked the [employer] Quattro the best.  [name] also would like to request a Day Study.  Mr Cody Ortega said he sleep better between the hours of 6:00am   and 3:30pm.  Please called  [name] to reschedule a desensitization and  A Day Study.

## 2013-06-06 ENCOUNTER — Other Ambulatory Visit: Payer: Self-pay | Admitting: Cardiovascular Disease

## 2013-06-19 ENCOUNTER — Other Ambulatory Visit (HOSPITAL_BASED_OUTPATIENT_CLINIC_OR_DEPARTMENT_OTHER): Payer: Medicaid Other

## 2013-06-20 ENCOUNTER — Encounter: Payer: Medicaid Other | Admitting: *Deleted

## 2013-06-24 ENCOUNTER — Encounter: Payer: Self-pay | Admitting: *Deleted

## 2013-07-10 ENCOUNTER — Encounter: Payer: Self-pay | Admitting: Internal Medicine

## 2013-07-10 ENCOUNTER — Ambulatory Visit (INDEPENDENT_AMBULATORY_CARE_PROVIDER_SITE_OTHER): Payer: Medicaid Other | Admitting: *Deleted

## 2013-07-10 DIAGNOSIS — I255 Ischemic cardiomyopathy: Secondary | ICD-10-CM

## 2013-07-10 DIAGNOSIS — I5022 Chronic systolic (congestive) heart failure: Secondary | ICD-10-CM

## 2013-07-10 DIAGNOSIS — I2589 Other forms of chronic ischemic heart disease: Secondary | ICD-10-CM

## 2013-07-10 LAB — MDC_IDC_ENUM_SESS_TYPE_INCLINIC
Battery Voltage: 3.14 V
Date Time Interrogation Session: 20150311161846
HIGH POWER IMPEDANCE MEASURED VALUE: 19 Ohm
HIGH POWER IMPEDANCE MEASURED VALUE: 47 Ohm
HighPow Impedance: 380 Ohm
HighPow Impedance: 60 Ohm
Lead Channel Sensing Intrinsic Amplitude: 13.5 mV
Lead Channel Setting Pacing Amplitude: 2.5 V
Lead Channel Setting Pacing Pulse Width: 0.4 ms
Lead Channel Setting Sensing Sensitivity: 0.3 mV
MDC IDC MSMT LEADCHNL RV IMPEDANCE VALUE: 342 Ohm
MDC IDC MSMT LEADCHNL RV PACING THRESHOLD AMPLITUDE: 1 V
MDC IDC MSMT LEADCHNL RV PACING THRESHOLD PULSEWIDTH: 0.4 ms
MDC IDC MSMT LEADCHNL RV SENSING INTR AMPL: 9.625 mV
MDC IDC SET ZONE DETECTION INTERVAL: 360 ms
MDC IDC STAT BRADY RV PERCENT PACED: 0.04 %
Zone Setting Detection Interval: 320 ms
Zone Setting Detection Interval: 450 ms

## 2013-07-10 NOTE — Progress Notes (Signed)
ICD check in clinic. Normal device function. Threshold and sensing consistent with previous device measurements. Impedance trends stable over time. 1 NSVT episode x 7 bts @ 231 bpm. Histogram distribution appropriate for patient and level of activity. No changes made this session. Device programmed at appropriate safety margins. Device programmed to optimize intrinsic conduction. Batt voltage 3.14V (ERI 2.63V). Plan to follow up with the device clinic in 3 months and with [facility] in Nov. Patient education completed including shock plan. Alert tones demonstrated for patient.

## 2013-07-13 ENCOUNTER — Other Ambulatory Visit: Payer: Self-pay | Admitting: Cardiovascular Disease

## 2013-07-30 ENCOUNTER — Other Ambulatory Visit: Payer: Self-pay | Admitting: Orthopedic Surgery

## 2013-07-30 DIAGNOSIS — M545 Low back pain, unspecified: Secondary | ICD-10-CM

## 2013-07-30 DIAGNOSIS — M5410 Radiculopathy, site unspecified: Secondary | ICD-10-CM

## 2013-07-31 ENCOUNTER — Encounter: Payer: Self-pay | Admitting: Cardiovascular Disease

## 2013-07-31 ENCOUNTER — Ambulatory Visit (INDEPENDENT_AMBULATORY_CARE_PROVIDER_SITE_OTHER): Payer: Medicaid Other | Admitting: Cardiovascular Disease

## 2013-07-31 VITALS — BP 160/102 | HR 71 | Ht 75.0 in | Wt 307.1 lb

## 2013-07-31 DIAGNOSIS — I2510 Atherosclerotic heart disease of native coronary artery without angina pectoris: Secondary | ICD-10-CM

## 2013-07-31 DIAGNOSIS — E7800 Pure hypercholesterolemia, unspecified: Secondary | ICD-10-CM

## 2013-07-31 DIAGNOSIS — I10 Essential (primary) hypertension: Secondary | ICD-10-CM

## 2013-07-31 DIAGNOSIS — I5022 Chronic systolic (congestive) heart failure: Secondary | ICD-10-CM

## 2013-07-31 NOTE — Progress Notes (Signed)
HPI:  53 year-old gentleman presenting for followup evaluation. He's followed for severe nonischemic cardiomyopathy, CKD, and CAD. 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  He's had a difficult winter. He describes seasonal depression. He's been limited severely by right knee pain. 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 He has been followed by Dr. Percell Miller. He is able to walk on his elliptical first 3 minutes daily, but that is all he can stand because of his knee trouble. He plays volleyball on Mondays and describes some breathlessness with activity. This is essentially unchanged over time. He's had some chest discomfort but only associates this with seasonal allergies. He has not had exertional chest pain or pressure. Denies edema, orthopnea, or PND. No syncope. Has been compliant with medications. Reports improved blood pressure control. Blood pressure this morning before he left the house was less than 120/80. He has not seen a severely elevated morning blood pressure readings that he's had in the past.   Outpatient Encounter Prescriptions as of 07/31/2013  Medication Sig  . albuterol (PROVENTIL) (2.5 MG/3ML) 0.083% nebulizer solution Take 2.5 mg by nebulization every 6 (six) hours as needed for wheezing or shortness of breath.  Marland Kitchen albuterol (VENTOLIN HFA) 108 (90 BASE) MCG/ACT inhaler 2 puffs as needed  . alprazolam (XANAX) 2 MG tablet Take one tablet by mouth four times a day  . amLODipine (NORVASC) 10 MG tablet TAKE 1 TABLET BY MOUTH EVERY DAY  . aspirin 81 MG EC tablet Take 81 mg by mouth daily.    . carvedilol (COREG) 25 MG tablet TAKE 1 TABLET BY MOUTH TWICE DAILY  . ferrous sulfate 325 (65 FE) MG tablet TAKE ONE TABLET BY MOUTH TWICE DAILY  . furosemide (LASIX) 40 MG tablet TAKE 1 TABLET BY MOUTH TWICE DAILY AND AS NEEDED FOR WEIGHT OVER 300LBS  . hydrALAZINE (APRESOLINE) 50 MG tablet TAKE 1 TABLET BY MOUTH THREE TIMES DAILY AS NEEDED FOR BLOOD PRESSURE  . IBUPROFEN PO TAKE 800MG  AS NEEDED FOR PAIN  .  potassium chloride SA (K-DUR,KLOR-CON) 20 MEQ tablet TAKE 2 TABLETS BY MOUTH DAILY  . pravastatin (PRAVACHOL) 80 MG tablet TAKE 1 TABLET BY MOUTH EVERY NIGHT AT BEDTIME  . traZODone (DESYREL) 50 MG tablet Take 50 mg by mouth at bedtime. As needed   . VIAGRA 100 MG tablet TAKE 1 TABLET BY MOUTH DAILY AS NEEDED FOR ERECTILE DYSFUNCTION    Allergies  Allergen Reactions  . Codeine   . Hydrocodone-Acetaminophen     Past Medical History  Diagnosis Date  . Chronic systolic congestive heart failure     Acute decompensation, LVEF less than 20%  . Hypertension   . History of cocaine abuse   . Bronchial asthma   . Benzodiazepine dependence   . Anxiety   . Depression   . Microcytic anemia   . Chronic kidney disease     Stage 3-4  . Cardiomyopathy     possible cocaine induced    ROS: Negative except as per HPI  BP 160/102  Pulse 71  Ht 6\' 3"  (1.905 m)  Wt 307 lb 1.9 oz (139.309 kg)  BMI 38.39 kg/m2  PHYSICAL EXAM: Pt is alert and oriented, pleasant obese male in NAD HEENT: normal Neck: JVP - normal, carotids 2+= without bruits Lungs: CTA bilaterally CV: RRR without murmur or gallop Abd: soft, NT, Positive BS, no hepatomegaly Ext: no C/C/E, distal pulses intact and equal Skin: warm/dry no rash  EKG:  Normal sinus rhythm 71 beats per minute, nonspecific T wave  abnormality.  2D Echo - 01/18/2013: Left ventricle: The cavity size was mildly dilated. Wall thickness was increased in a pattern of mild LVH. Systolic function was mildly reduced. The estimated ejection fraction was in the range of 45% to 50%. Diffuse hypokinesis. Doppler parameters are consistent with abnormal left ventricular relaxation (grade 1 diastolic dysfunction).  ------------------------------------------------------------ Aortic valve: Trileaflet; normal thickness leaflets. Mobility was not restricted. Doppler: Transvalvular velocity was within the normal range. There was no stenosis. No  regurgitation.  ------------------------------------------------------------ Aorta: Aortic root: The aortic root was mildly dilated.  ------------------------------------------------------------ Mitral valve: Structurally normal valve. Mobility was not restricted. Doppler: Transvalvular velocity was within the normal range. There was no evidence for stenosis. No regurgitation. Peak gradient: 85mm Hg (D).  ------------------------------------------------------------ Left atrium: The atrium was moderately dilated.  ------------------------------------------------------------ Right ventricle: The cavity size was mildly dilated. Pacer wire or catheter noted in right ventricle. Systolic function was normal.  ------------------------------------------------------------ Pulmonic valve: Doppler: Transvalvular velocity was within the normal range. There was no evidence for stenosis.  ------------------------------------------------------------ Tricuspid valve: Structurally normal valve. Doppler: Transvalvular velocity was within the normal range. Trivial regurgitation.  ------------------------------------------------------------ Right atrium: The atrium was mildly dilated. Pacer wire or catheter noted in right atrium.  ------------------------------------------------------------ Pericardium: There was no pericardial effusion.  ASSESSMENT AND PLAN: 1. Chronic systolic heart failure. His LV EF was improved at time of last assessment in 2014. He will continue on his current medical program as he appears to be stable with [place] Heart Association functional class II symptoms. I strongly encouraged increased efforts at exercise and weight loss. Unfortunately he is pretty limited by right knee arthritis. I think his cardiac situation has been stable and that he would be a candidate for knee replacement surgery if required. Suspect weight loss would really help him with regard to his knee  problems as well.  2. Malignant hypertension. Well blood pressure is elevated in the office. His home readings have been much better. His blood pressure before he left his house was under good control. I think he should continue on amlodipine, carvedilol, and hydralazine.  3. Chronic kidney disease. At the time of last assessment, his kidney disease was stable. He has CKD stage III with most recent creatinine of 1.6 (GFR 50)  4. Coronary atherosclerosis, native vessel. No anginal symptoms. He remains on aspirin and a statin drug.  5. Hyperlipidemia. I will see him back in 6 months with a lipid panel prior to that visit. He continues on pravastatin 80 mg daily. Last lipids from one year ago showed an LDL of 83, total cholesterol 136, HDL 29.  [name] 07/31/2013 12:07 PM

## 2013-07-31 NOTE — Patient Instructions (Signed)
Your physician recommends that you continue on your current medications as directed. Please refer to the Current Medication list given to you today.  Your physician recommends that you return for lab work in: 6 months before you see Dr. Burt Knack - you will need to fast for this appointment (nothing to eat or drink after midnight the night before except water)  Your physician wants you to follow-up in: 6 months with Dr. Burt Knack after you have lab work.  You will receive a reminder letter in the mail two months in advance. If you don't receive a letter, please call our office to schedule the follow-up appointment.   Marland Kitchen

## 2013-08-13 ENCOUNTER — Other Ambulatory Visit: Payer: Self-pay | Admitting: Cardiovascular Disease

## 2013-08-14 ENCOUNTER — Ambulatory Visit: Payer: Medicaid Other | Admitting: Cardiovascular Disease

## 2013-09-16 ENCOUNTER — Other Ambulatory Visit: Payer: Self-pay | Admitting: Cardiovascular Disease

## 2013-10-09 ENCOUNTER — Ambulatory Visit (INDEPENDENT_AMBULATORY_CARE_PROVIDER_SITE_OTHER): Payer: Medicaid Other | Admitting: *Deleted

## 2013-10-09 ENCOUNTER — Encounter: Payer: Self-pay | Admitting: Internal Medicine

## 2013-10-09 DIAGNOSIS — Z95810 Presence of automatic (implantable) cardiac defibrillator: Secondary | ICD-10-CM

## 2013-10-09 DIAGNOSIS — I5022 Chronic systolic (congestive) heart failure: Secondary | ICD-10-CM

## 2013-10-09 LAB — MDC_IDC_ENUM_SESS_TYPE_INCLINIC
HighPow Impedance: 19 Ohm
HighPow Impedance: 342 Ohm
HighPow Impedance: 51 Ohm
HighPow Impedance: 66 Ohm
Lead Channel Impedance Value: 342 Ohm
Lead Channel Pacing Threshold Amplitude: 1 V
Lead Channel Pacing Threshold Pulse Width: 0.4 ms
Lead Channel Sensing Intrinsic Amplitude: 10.25 mV
Lead Channel Sensing Intrinsic Amplitude: 14.5 mV
Lead Channel Setting Pacing Amplitude: 2.5 V
Lead Channel Setting Pacing Pulse Width: 0.4 ms
Lead Channel Setting Sensing Sensitivity: 0.3 mV
MDC IDC MSMT BATTERY VOLTAGE: 3.14 V
MDC IDC SESS DTM: 20150610095156
MDC IDC SET ZONE DETECTION INTERVAL: 360 ms
MDC IDC STAT BRADY RV PERCENT PACED: 0.06 %
Zone Setting Detection Interval: 320 ms
Zone Setting Detection Interval: 450 ms

## 2013-10-09 NOTE — Progress Notes (Signed)
ICD check in clinic. Normal device function. Thresholds and sensing consistent with previous device measurements. Impedance trends stable over time. No evidence of any ventricular arrhythmias. No mode switches. Histogram distribution appropriate for patient and level of activity. No changes made this session. Device programmed at appropriate safety margins. Device programmed to optimize intrinsic conduction. Battery @3 .14V. Alert tones demonstrated for patient, pt knows to call clinic if heard. ROV w/ device clinic 01/09/14.

## 2013-10-17 ENCOUNTER — Telehealth: Payer: Self-pay | Admitting: *Deleted

## 2013-10-17 NOTE — Telephone Encounter (Signed)
The pt questioned why Hydralazine said as needed for BP. The pt should continue this medication three times a day as directed on the bottle.

## 2013-10-17 NOTE — Telephone Encounter (Signed)
These sound like very low BP's for this patient. May be best to decrease hydralazine to 25 mg TID. thx

## 2013-10-17 NOTE — Telephone Encounter (Signed)
Patient walked in to scheduling department with a chief complaint of lethargy and triage was called to see him. The patient reports that he saw PCP/Dr.Garba on 6/16 and was told his BP was low and was advised to go home and drink a gatorade. He states that he was in the "neighborhood" today and wanted to get his blood pressure checked because he is leaving for [place] in a few days and wants to make sure that his BP is OK. His BP in the right arm with a large cuff was 110/74 with a heart rate of 80. He states that he actually felt somewhat better today. Reviewed his BP meds that he was unsure of because he had them in boxes at home and not with him. He thinks he takes Amlodipine 10mg  every day and Coreg 25mg  BID and Hydralazine 50mg  TID. The Hyrdalazine 50mg  is listed as TID and to take as needed for high blood pressure. Advised him to check his BP at home if he feels lethargic and the BP is low he will hold one dose and reassess later in the day. The patient has requested a copy of his last  MD office visit/medical records. Advised he will have to sign a release and then an employee form the MR department will provide these records for him. Will forward this note to Dr.Cooper for review.

## 2013-10-18 MED ORDER — HYDRALAZINE HCL 25 MG PO TABS: ORAL_TABLET | ORAL | Status: DC

## 2013-10-18 NOTE — Telephone Encounter (Signed)
Pt aware of medication change recommended by Dr Burt Knack. The pt will continue to monitor his BP and contact the office if his BP becomes elevated.  I made the pt aware that we would like to see his BP consistently below 140/90. The pt plans to cut 50mg  tablet in half and finish using his current supply.  He will contact the office when he would like a Rx sent to the pharmacy for 25mg  tablets.

## 2013-11-19 ENCOUNTER — Other Ambulatory Visit: Payer: Self-pay | Admitting: Cardiovascular Disease

## 2013-11-19 NOTE — Telephone Encounter (Signed)
[name], MD at 07/31/2013 11:49 AM  amLODipine (NORVASC) 10 MG tablet  TAKE 1 TABLET BY MOUTH EVERY DAY    Your physician recommends that you continue on your current medications as directed. Please refer to the Current Medication list given to you today.

## 2013-12-21 ENCOUNTER — Other Ambulatory Visit: Payer: Self-pay | Admitting: Internal Medicine

## 2013-12-25 ENCOUNTER — Emergency Department (HOSPITAL_COMMUNITY): Payer: Medicaid Other

## 2013-12-25 ENCOUNTER — Encounter (HOSPITAL_COMMUNITY): Payer: Self-pay | Admitting: Emergency Medicine

## 2013-12-25 ENCOUNTER — Inpatient Hospital Stay (HOSPITAL_COMMUNITY)
Admission: EM | Admit: 2013-12-25 | Discharge: 2013-12-29 | DRG: 309 | Discharge status: Against Medical Advice | Payer: Medicaid Other | Attending: Internal Medicine | Admitting: Internal Medicine

## 2013-12-25 DIAGNOSIS — I5022 Chronic systolic (congestive) heart failure: Secondary | ICD-10-CM | POA: Diagnosis present

## 2013-12-25 DIAGNOSIS — F411 Generalized anxiety disorder: Secondary | ICD-10-CM | POA: Diagnosis present

## 2013-12-25 DIAGNOSIS — Z833 Family history of diabetes mellitus: Secondary | ICD-10-CM | POA: Diagnosis not present

## 2013-12-25 DIAGNOSIS — F329 Major depressive disorder, single episode, unspecified: Secondary | ICD-10-CM | POA: Diagnosis present

## 2013-12-25 DIAGNOSIS — Z885 Allergy status to narcotic agent status: Secondary | ICD-10-CM

## 2013-12-25 DIAGNOSIS — Z803 Family history of malignant neoplasm of breast: Secondary | ICD-10-CM

## 2013-12-25 DIAGNOSIS — N184 Chronic kidney disease, stage 4 (severe): Secondary | ICD-10-CM | POA: Diagnosis present

## 2013-12-25 DIAGNOSIS — I2589 Other forms of chronic ischemic heart disease: Secondary | ICD-10-CM | POA: Diagnosis present

## 2013-12-25 DIAGNOSIS — M79671 Pain in right foot: Secondary | ICD-10-CM

## 2013-12-25 DIAGNOSIS — I129 Hypertensive chronic kidney disease with stage 1 through stage 4 chronic kidney disease, or unspecified chronic kidney disease: Secondary | ICD-10-CM | POA: Diagnosis present

## 2013-12-25 DIAGNOSIS — I509 Heart failure, unspecified: Secondary | ICD-10-CM | POA: Diagnosis present

## 2013-12-25 DIAGNOSIS — M79609 Pain in unspecified limb: Secondary | ICD-10-CM | POA: Diagnosis present

## 2013-12-25 DIAGNOSIS — I4891 Unspecified atrial fibrillation: Secondary | ICD-10-CM | POA: Diagnosis not present

## 2013-12-25 DIAGNOSIS — Z8673 Personal history of transient ischemic attack (TIA), and cerebral infarction without residual deficits: Secondary | ICD-10-CM

## 2013-12-25 DIAGNOSIS — J45909 Unspecified asthma, uncomplicated: Secondary | ICD-10-CM | POA: Diagnosis present

## 2013-12-25 DIAGNOSIS — Z8042 Family history of malignant neoplasm of prostate: Secondary | ICD-10-CM | POA: Diagnosis not present

## 2013-12-25 DIAGNOSIS — Z87891 Personal history of nicotine dependence: Secondary | ICD-10-CM | POA: Diagnosis not present

## 2013-12-25 DIAGNOSIS — R7402 Elevation of levels of lactic acid dehydrogenase (LDH): Secondary | ICD-10-CM | POA: Diagnosis present

## 2013-12-25 DIAGNOSIS — F3289 Other specified depressive episodes: Secondary | ICD-10-CM | POA: Diagnosis present

## 2013-12-25 DIAGNOSIS — Z79899 Other long term (current) drug therapy: Secondary | ICD-10-CM

## 2013-12-25 DIAGNOSIS — R7401 Elevation of levels of liver transaminase levels: Secondary | ICD-10-CM | POA: Diagnosis present

## 2013-12-25 DIAGNOSIS — F1210 Cannabis abuse, uncomplicated: Secondary | ICD-10-CM

## 2013-12-25 DIAGNOSIS — Z7982 Long term (current) use of aspirin: Secondary | ICD-10-CM | POA: Diagnosis not present

## 2013-12-25 DIAGNOSIS — E876 Hypokalemia: Secondary | ICD-10-CM | POA: Diagnosis present

## 2013-12-25 DIAGNOSIS — Z791 Long term (current) use of non-steroidal anti-inflammatories (NSAID): Secondary | ICD-10-CM

## 2013-12-25 DIAGNOSIS — M1710 Unilateral primary osteoarthritis, unspecified knee: Secondary | ICD-10-CM | POA: Diagnosis present

## 2013-12-25 DIAGNOSIS — F1320 Sedative, hypnotic or anxiolytic dependence, uncomplicated: Secondary | ICD-10-CM | POA: Diagnosis present

## 2013-12-25 DIAGNOSIS — Z807 Family history of other malignant neoplasms of lymphoid, hematopoietic and related tissues: Secondary | ICD-10-CM | POA: Diagnosis not present

## 2013-12-25 DIAGNOSIS — I2510 Atherosclerotic heart disease of native coronary artery without angina pectoris: Secondary | ICD-10-CM | POA: Diagnosis present

## 2013-12-25 DIAGNOSIS — N183 Chronic kidney disease, stage 3 unspecified: Secondary | ICD-10-CM

## 2013-12-25 DIAGNOSIS — W19XXXA Unspecified fall, initial encounter: Secondary | ICD-10-CM | POA: Diagnosis present

## 2013-12-25 DIAGNOSIS — Z95810 Presence of automatic (implantable) cardiac defibrillator: Secondary | ICD-10-CM

## 2013-12-25 DIAGNOSIS — E785 Hyperlipidemia, unspecified: Secondary | ICD-10-CM | POA: Diagnosis present

## 2013-12-25 DIAGNOSIS — IMO0002 Reserved for concepts with insufficient information to code with codable children: Secondary | ICD-10-CM

## 2013-12-25 DIAGNOSIS — I10 Essential (primary) hypertension: Secondary | ICD-10-CM

## 2013-12-25 DIAGNOSIS — R740 Nonspecific elevation of levels of transaminase and lactic acid dehydrogenase [LDH]: Secondary | ICD-10-CM

## 2013-12-25 HISTORY — DX: Paroxysmal atrial fibrillation: I48.0

## 2013-12-25 HISTORY — DX: Atherosclerotic heart disease of native coronary artery without angina pectoris: I25.10

## 2013-12-25 HISTORY — DX: Chronic kidney disease, stage 4 (severe): N18.4

## 2013-12-25 HISTORY — DX: Presence of automatic (implantable) cardiac defibrillator: Z95.810

## 2013-12-25 HISTORY — DX: Cerebral infarction, unspecified: I63.9

## 2013-12-25 LAB — PROTIME-INR
INR: 1.14 (ref 0.00–1.49)
Prothrombin Time: 14.6 seconds (ref 11.6–15.2)

## 2013-12-25 LAB — RAPID URINE DRUG SCREEN, HOSP PERFORMED
AMPHETAMINES: NOT DETECTED
Barbiturates: NOT DETECTED
Benzodiazepines: POSITIVE — AB
COCAINE: NOT DETECTED
OPIATES: NOT DETECTED
Tetrahydrocannabinol: POSITIVE — AB

## 2013-12-25 LAB — CBC WITH DIFFERENTIAL/PLATELET
Basophils Absolute: 0 10*3/uL (ref 0.0–0.1)
Basophils Relative: 0 % (ref 0–1)
Eosinophils Absolute: 0.2 10*3/uL (ref 0.0–0.7)
Eosinophils Relative: 2 % (ref 0–5)
HEMATOCRIT: 48.1 % (ref 39.0–52.0)
Hemoglobin: 16.3 g/dL (ref 13.0–17.0)
Lymphocytes Relative: 18 % (ref 12–46)
Lymphs Abs: 1.7 10*3/uL (ref 0.7–4.0)
MCH: 30.9 pg (ref 26.0–34.0)
MCHC: 33.9 g/dL (ref 30.0–36.0)
MCV: 91.3 fL (ref 78.0–100.0)
MONOS PCT: 12 % (ref 3–12)
Monocytes Absolute: 1.1 10*3/uL — ABNORMAL HIGH (ref 0.1–1.0)
NEUTROS PCT: 68 % (ref 43–77)
Neutro Abs: 6.2 10*3/uL (ref 1.7–7.7)
PLATELETS: 173 10*3/uL (ref 150–400)
RBC: 5.27 MIL/uL (ref 4.22–5.81)
RDW: 14.1 % (ref 11.5–15.5)
WBC: 9.1 10*3/uL (ref 4.0–10.5)

## 2013-12-25 LAB — COMPREHENSIVE METABOLIC PANEL
ALK PHOS: 63 U/L (ref 39–117)
ALT: 88 U/L — ABNORMAL HIGH (ref 0–53)
AST: 61 U/L — ABNORMAL HIGH (ref 0–37)
Albumin: 3.7 g/dL (ref 3.5–5.2)
Anion gap: 14 (ref 5–15)
BUN: 27 mg/dL — ABNORMAL HIGH (ref 6–23)
CO2: 23 mEq/L (ref 19–32)
Calcium: 9.4 mg/dL (ref 8.4–10.5)
Chloride: 100 mEq/L (ref 96–112)
Creatinine, Ser: 1.88 mg/dL — ABNORMAL HIGH (ref 0.50–1.35)
GFR calc Af Amer: 46 mL/min — ABNORMAL LOW (ref >90)
GFR calc non Af Amer: 40 mL/min — ABNORMAL LOW (ref >90)
Glucose, Bld: 109 mg/dL — ABNORMAL HIGH (ref 70–99)
POTASSIUM: 3.8 meq/L (ref 3.7–5.3)
Sodium: 137 mEq/L (ref 137–147)
TOTAL PROTEIN: 7.8 g/dL (ref 6.0–8.3)
Total Bilirubin: 1.1 mg/dL (ref 0.3–1.2)

## 2013-12-25 LAB — TROPONIN I: Troponin I: <0.3 ng/mL (ref <0.30)

## 2013-12-25 LAB — APTT: aPTT: 25 seconds (ref 24–37)

## 2013-12-25 MED ORDER — SODIUM CHLORIDE 0.9 % IV BOLUS (SEPSIS)
500.0000 mL | Freq: Once | INTRAVENOUS | Status: AC
  Administered 2013-12-25: 500 mL via INTRAVENOUS

## 2013-12-25 MED ORDER — ALPRAZOLAM 0.5 MG PO TABS
2.0000 mg | ORAL_TABLET | ORAL | Status: AC
  Administered 2013-12-25: 2 mg via ORAL
  Filled 2013-12-25: qty 4

## 2013-12-25 MED ORDER — HEPARIN (PORCINE) IN NACL 100-0.45 UNIT/ML-% IJ SOLN
1800.0000 [IU]/h | INTRAMUSCULAR | Status: DC
  Filled 2013-12-25 (×2): qty 250

## 2013-12-25 MED ORDER — ONDANSETRON HCL 4 MG/2ML IJ SOLN: 4.0000 mg | Freq: Four times a day (QID) | INTRAMUSCULAR | Status: DC | PRN

## 2013-12-25 MED ORDER — ACETAMINOPHEN 325 MG PO TABS
650.0000 mg | ORAL_TABLET | Freq: Once | ORAL | Status: DC
  Filled 2013-12-25: qty 2

## 2013-12-25 MED ORDER — METOPROLOL TARTRATE 1 MG/ML IV SOLN
2.5000 mg | Freq: Once | INTRAVENOUS | Status: AC
  Administered 2013-12-25: 2.5 mg via INTRAVENOUS
  Filled 2013-12-25: qty 5

## 2013-12-25 MED ORDER — CARVEDILOL 25 MG PO TABS
25.0000 mg | ORAL_TABLET | Freq: Two times a day (BID) | ORAL | Status: DC
  Administered 2013-12-26 – 2013-12-29 (×7): 25 mg via ORAL
  Filled 2013-12-25 (×6): qty 1
  Filled 2013-12-25 (×2): qty 2
  Filled 2013-12-25: qty 1

## 2013-12-25 MED ORDER — DILTIAZEM HCL 100 MG IV SOLR
5.0000 mg/h | INTRAVENOUS | Status: DC
  Administered 2013-12-25: 5 mg/h via INTRAVENOUS
  Administered 2013-12-26 – 2013-12-27 (×5): 15 mg/h via INTRAVENOUS
  Filled 2013-12-25 (×2): qty 100

## 2013-12-25 MED ORDER — HEPARIN BOLUS VIA INFUSION
3000.0000 [IU] | Freq: Once | INTRAVENOUS | Status: DC
  Filled 2013-12-25: qty 3000

## 2013-12-25 MED ORDER — ACETAMINOPHEN 650 MG RE SUPP: 650.0000 mg | Freq: Four times a day (QID) | RECTAL | Status: DC | PRN

## 2013-12-25 MED ORDER — ONDANSETRON HCL 4 MG PO TABS: 4.0000 mg | ORAL_TABLET | Freq: Four times a day (QID) | ORAL | Status: DC | PRN

## 2013-12-25 MED ORDER — ALBUTEROL SULFATE (2.5 MG/3ML) 0.083% IN NEBU
2.5000 mg | INHALATION_SOLUTION | Freq: Four times a day (QID) | RESPIRATORY_TRACT | Status: DC | PRN
  Administered 2013-12-26: 2.5 mg via RESPIRATORY_TRACT
  Filled 2013-12-25: qty 3

## 2013-12-25 MED ORDER — FERROUS SULFATE 325 (65 FE) MG PO TABS
325.0000 mg | ORAL_TABLET | Freq: Two times a day (BID) | ORAL | Status: DC
  Administered 2013-12-26 – 2013-12-29 (×7): 325 mg via ORAL
  Filled 2013-12-25 (×9): qty 1

## 2013-12-25 MED ORDER — DILTIAZEM LOAD VIA INFUSION
15.0000 mg | Freq: Once | INTRAVENOUS | Status: AC
  Administered 2013-12-25: 15 mg via INTRAVENOUS
  Filled 2013-12-25: qty 15

## 2013-12-25 MED ORDER — FUROSEMIDE 40 MG PO TABS
40.0000 mg | ORAL_TABLET | Freq: Two times a day (BID) | ORAL | Status: DC | PRN
  Filled 2013-12-25: qty 1

## 2013-12-25 MED ORDER — ACETAMINOPHEN 325 MG PO TABS: 650.0000 mg | ORAL_TABLET | Freq: Four times a day (QID) | ORAL | Status: DC | PRN

## 2013-12-25 MED ORDER — ALPRAZOLAM 1 MG PO TABS
2.0000 mg | ORAL_TABLET | Freq: Once | ORAL | Status: AC
  Administered 2013-12-25: 2 mg via ORAL
  Filled 2013-12-25: qty 2

## 2013-12-25 MED ORDER — ATORVASTATIN CALCIUM 20 MG PO TABS
20.0000 mg | ORAL_TABLET | Freq: Every day | ORAL | Status: DC
  Administered 2013-12-26 – 2013-12-28 (×3): 20 mg via ORAL
  Filled 2013-12-25 (×2): qty 1
  Filled 2013-12-25: qty 2
  Filled 2013-12-25: qty 1

## 2013-12-25 MED ORDER — ALPRAZOLAM 1 MG PO TABS
1.0000 mg | ORAL_TABLET | Freq: Four times a day (QID) | ORAL | Status: DC | PRN
  Administered 2013-12-26: 1 mg via ORAL
  Filled 2013-12-25: qty 1

## 2013-12-25 MED ORDER — RIVAROXABAN 15 MG PO TABS
15.0000 mg | ORAL_TABLET | Freq: Every day | ORAL | Status: DC
  Administered 2013-12-25 – 2013-12-29 (×5): 15 mg via ORAL
  Filled 2013-12-25 (×5): qty 1

## 2013-12-25 MED ORDER — TRAZODONE HCL 50 MG PO TABS
50.0000 mg | ORAL_TABLET | Freq: Every day | ORAL | Status: DC
  Filled 2013-12-25 (×6): qty 1

## 2013-12-25 MED ORDER — SODIUM CHLORIDE 0.9 % IJ SOLN
3.0000 mL | Freq: Two times a day (BID) | INTRAMUSCULAR | Status: DC
  Administered 2013-12-28: 3 mL via INTRAVENOUS

## 2013-12-25 MED ORDER — ACETAMINOPHEN 325 MG PO TABS
650.0000 mg | ORAL_TABLET | Freq: Once | ORAL | Status: AC
  Administered 2013-12-25: 650 mg via ORAL
  Filled 2013-12-25: qty 2

## 2013-12-25 MED ORDER — AMLODIPINE BESYLATE 10 MG PO TABS: 10.0000 mg | ORAL_TABLET | Freq: Every day | ORAL | Status: DC

## 2013-12-25 NOTE — Consult Note (Addendum)
Cardiology Consultation Note  Patient ID: Cody Ortega, MRN: [id number], DOB/AGE: 1961/04/19 53 y.o. Admit date: 12/25/2013   Date of Consult: 12/25/2013 Primary Physician: Barbette Merino, MD Primary Cardiologist: Burt Knack Consulting cardiologist: Bronson Ing  Chief Complaint: foot pain Reason for Consult: atrial fib  HPI: Cody Ortega is a 53 y/o male with a history of chronic systolic heart failure (EF 45-50% in 12/2012, previously lower, s/p ICD 2012), CAD managed medically by cath 03/2010, prior stroke, episode of atrial fib 2008 spontaneously converted, HL, CKD stage III-IV, HTN, prior substance abuse who presented to [facility] with complaints of foot pain. He was incidentally noted to be in atrial fib with RVR. He awoke with right foot pain after getting out of bed, but denies knowledge of any injury which may have precipitated it. He denies fevers/chills. He has been feeling well from a cardiovascular standpoint, and denies chest pain, shortness of breath, orthopnea, leg swelling, PND, lightheadedness/dizziness, and syncope. He traveled to [place] in July and developed an upper respiratory tract infection beforehand, which was treated with antibiotics (azithromycin). He denies dysuria, hematochezia, and melena nor any other bleeding problems. Has right knee arthritis for which he has received steroid injections.  UDS + benzos, THC. CMET BUN 27/Cr 1.88 (1.4-1.6 in 2014), AST/ALT 61/88, CBC OK.  Soc: Unemployed. Denies tobacco use. Says he is "on a smoking panel" but denies inhalation. Denies cocaine and alcohol use.  Past Medical History  Diagnosis Date  . Chronic systolic congestive heart failure     Acute decompensation, LVEF less than 20%  . Hypertension   . History of cocaine abuse   . Bronchial asthma   . Benzodiazepine dependence   . Anxiety   . Depression   . Microcytic anemia   . CKD (chronic kidney disease), stage IV     Stage 3-4  . 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 PAF (paroxysmal atrial fibrillation)     a. Episode in 2008 with spontaneous conversion  . CAD (coronary artery disease)     a. Cath 03/2010: mod RCA stenosis, severe diag stenosis, treated medically given lack of angina.  . Stroke 2004  . S/P implantation of automatic cardioverter/defibrillator (AICD)     a. Medtronic, implanted 2012.      Most Recent Cardiac Studies: 03/2010         CARDIAC CATHETERIZATION PROCEDURE: 1. Left heart catheterization. 2. Selective coronary angiography. 3. Abdominal aortic angiography. PERFORMING PHYSICIAN:  Kathlyn Sacramento, MD. PROCTORING PHYSICIAN:  Juanda Bond. Burt Knack, MD Marlise Eves INDICATIONS:  Mr. Brigham is a 53 year old gentleman with a severe cardiomyopathy with left ventricular ejection fraction less than 30%.  The patient was hospitalized several months ago with congestive heart failure and malignant hypertension.  He has been treated with aggressive antihypertensive therapy and has clinically improved. However, his cardiomyopathy has persisted.  He was referred for cardiac catheterization to rule out obstructive CAD.  Of note, we attempted a nuclear study, but this was not able to be completed because of severe hypertension. Risks and indications of procedure were reviewed with the patient. Informed consent was obtained.  The right wrist was prepped, draped, and anesthetized with 1% lidocaine.  Using modified Seldinger technique, a 5- French sheath was placed in the right radial artery.  5000 units of unfractionated heparin was administered intravenously.  Standard Judkins catheters were used for coronary angiography.  A pigtail catheter was used to record left ventricular pressures and to perform the distal aortogram.  Verapamil was administered  through the sheath at the beginning of the procedure to prevent radial spasm.  The patient tolerated the procedure well.  All catheter exchanges were performed over an exchange length  J-tip wire. PROCEDURAL FINDINGS:  Aortic pressure 176/102 with a mean of 129, left ventricular pressure 172/15. CORONARY ANGIOGRAPHY:  RCA:  The RCA is a large, dominant vessel.  The vessel has diffuse plaquing in the proximal and mid vessel.  There is an ulcerated-appearing area in the proximal vessel with no associated obstructive disease.  At the junction of the mid and distal vessel, there is a focal calcified hazy-appearing lesion graded at 70% stenosis with a "napkin ring" appearance.  The vessel gives off a large PDA branch and a moderate posterolateral branch.  The branch vessels of the RCA have no high-grade obstructive disease present. Left coronary artery:  The left mainstem is very short.  It is calcified with no obstructive disease.  It divides into the LAD and left circumflex. LAD:  The LAD is a large-caliber vessel that wraps around the left ventricular apex.  The proximal LAD has mild nonobstructive plaque. Just after a large first diagonal branch, there is a 30-40% stenosis present.  The mid LAD also has 30-40% stenosis with no areas of high- grade stenosis noted.  There is a large first diagonal branch that has an 80-90% eccentric stenosis in its proximal aspect.  Just beyond that area there is segmental plaquing of up to 50% stenosis. Left circumflex.  The left circumflex has a mild ostial stenosis estimated at 30%.  The vessel courses down and supplies a small first OM and the large second OM branch.  The mid circumflex between the obtuse marginal branches has mild nonobstructive stenosis. Abdominal aortography:  The abdominal aorta is patent.  There are single renal arteries bilaterally, both of which are patent.  The left renal artery is not well visualized, but does not appear to have significant stenosis present. FINAL ASSESSMENT: 1. Moderate right coronary artery stenosis. 2. Severe diagonal stenosis. 3. Patent renal arteries. 4. Severe systemic  hypertension. DISCUSSION:  The patient has a severe cardiomyopathy, out of proportion to the extent of his coronary artery disease.  However, he does have a moderate area of myocardium, at risk from obstructive CAD involving the right coronary artery and diagonal territories.  We will review his films further and consider staged intervention of the diagonal branch and possible pressure wire analysis of the right coronary artery.  Of note, the patient does not have active angina.  We will review details further with the patient and continue his current medical therapy at present. Juanda Bond. Burt Knack, MD  2D Echo 12/2012 Study Conclusions  - Left ventricle: The cavity size was mildly dilated. Wall thickness was increased in a pattern of mild LVH. Systolic function was mildly reduced. The estimated ejection fraction was in the range of 45% to 50%. Diffuse hypokinesis. Doppler parameters are consistent with abnormal left ventricular relaxation (grade 1 diastolic dysfunction). - Aortic root: The aortic root was mildly dilated. - Left atrium: The atrium was moderately dilated. - Right ventricle: The cavity size was mildly dilated. - Right atrium: The atrium was mildly dilated.  Old Echo 2011 - Left ventricle: The cavity size was moderately dilated. Wall thickness was increased in a pattern of moderate LVH. There was moderate concentric hypertrophy. Systolic function was severely reduced. The estimated ejection fraction was in the range of 20% to 25%. Diffuse hypokinesis. Doppler parameters are consistent with abnormal left ventricular relaxation (  grade 1 diastolic dysfunction). Doppler parameters are consistent with high ventricular filling pressure. - Left atrium: The atrium was moderately dilated. - Right ventricle: Hypertrophy was present. - Right atrium: The atrium was mildly dilated.    Surgical History:  Past Surgical History  Procedure Laterality Date  . Tonsillectomy    .  Transthoracic echocardiogram  10/2006, 12/2009     Home Meds: Prior to Admission medications   Medication Sig Start Date End Date Taking? Authorizing Provider  albuterol (PROVENTIL) (2.5 MG/3ML) 0.083% nebulizer solution Take 2.5 mg by nebulization every 6 (six) hours as needed for wheezing or shortness of breath.   Yes Historical Provider, MD  albuterol (VENTOLIN HFA) 108 (90 BASE) MCG/ACT inhaler 2 puffs as needed   Yes Historical Provider, MD  alprazolam Duanne Moron) 2 MG tablet Take one tablet by mouth four times a day   Yes Historical Provider, MD  amLODipine (NORVASC) 10 MG tablet Take 10 mg by mouth daily. In the evening   Yes Historical Provider, MD  aspirin 81 MG EC tablet Take 81 mg by mouth daily.     Yes Historical Provider, MD  carvedilol (COREG) 25 MG tablet Take 25 mg by mouth 2 (two) times daily with a meal.   Yes Historical Provider, MD  ferrous sulfate 325 (65 FE) MG tablet Take 325 mg by mouth 2 (two) times daily with a meal.   Yes Historical Provider, MD  furosemide (LASIX) 40 MG tablet Take 40 mg by mouth 2 (two) times daily. As needed for weight over 300 lbs   Yes Historical Provider, MD  hydrALAZINE (APRESOLINE) 25 MG tablet Take 25 mg by mouth 3 (three) times daily.   Yes Historical Provider, MD  ibuprofen (ADVIL,MOTRIN) 800 MG tablet Take 800 mg by mouth every 8 (eight) hours as needed for moderate pain.   Yes Historical Provider, MD  potassium chloride SA (K-DUR,KLOR-CON) 20 MEQ tablet Take 20 mEq by mouth daily.   Yes Historical Provider, MD  pravastatin (PRAVACHOL) 80 MG tablet Take 80 mg by mouth at bedtime.   Yes Historical Provider, MD  sildenafil (VIAGRA) 100 MG tablet Take 100 mg by mouth daily as needed for erectile dysfunction.   Yes Historical Provider, MD  traZODone (DESYREL) 50 MG tablet Take 50 mg by mouth at bedtime. For sleep   Yes Historical Provider, MD    Inpatient Medications:    . diltiazem     And  . diltiazem (CARDIZEM) infusion      Allergies:   Allergies  Allergen Reactions  . Codeine     unknown  . Hydrocodone-Acetaminophen     unknown    History   Social History  . Marital Status: Divorced    Spouse Name: N/A    Number of Children: N/A  . Years of Education: N/A   Occupational History  . Unemployed     basketball referee in the past   Social History Main Topics  . Smoking status: Former Research scientist (life sciences)  . Smokeless tobacco: Not on file  . Alcohol Use: Yes     Comment: occ  . Drug Use: No     Comment: Reported history of cocaine abuse off and on  . Sexual Activity: Not on file   Other Topics Concern  . Not on file   Social History Narrative   Living with a son who is a 23 year old.  He is not     working, unemployed for 3 years.  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  Denied use of alcohol.  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           Family History  Problem Relation Age of Onset  . Breast cancer Mother   . Multiple myeloma Mother   . Prostate cancer Father   . Diabetes Father   . Cerebral palsy Daughter      Review of Systems: As per HPI, otherwise negative.  Labs: No results found for this basename: CKTOTAL, CKMB, TROPONINI,  in the last 72 hours Lab Results  Component Value Date   WBC 9.1 12/25/2013   HGB 16.3 12/25/2013   HCT 48.1 12/25/2013   MCV 91.3 12/25/2013   PLT 173 12/25/2013    Recent Labs Lab 12/25/13 1712  NA 137  K 3.8  CL 100  CO2 23  BUN 27*  CREATININE 1.88*  CALCIUM 9.4  PROT 7.8  BILITOT 1.1  ALKPHOS 63  ALT 88*  AST 61*  GLUCOSE 109*   Lab Results  Component Value Date   CHOL 136 07/10/2012   HDL 29.20* 07/10/2012   LDLCALC 83 07/10/2012   TRIG 119.0 07/10/2012     Radiology/Studies:  [name] Complete Right  12/25/2013   CLINICAL DATA:  Pain at the base of the second and third metatarsals; trauma yesterday  EXAM: RIGHT FOOT COMPLETE - 3+ VIEW  COMPARISON:  None.  FINDINGS: The bones of the foot are adequately mineralized.  There is no acute fracture nor dislocation. There is no significant degenerative change. The soft tissues are unremarkable. Specific attention to the metatarsal bases reveals no acute bony abnormality. There is a tiny calcific density that lies between the proximal first and second metatarsal shafts.  IMPRESSION: There is no acute bony abnormality of the right foot. If there are strong clinical concerns of occult injury at the tarsometatarsal joint region, further evaluation with MRI or CT scanning would be useful.   Electronically Signed   By: David  Martinique   On: 12/25/2013 16:59    EKG: Atrial fibrillation, HR 140 bpm, late R wave progression, nonspecific T wave abnormality  Physical Exam: Blood pressure 128/109, pulse 131, temperature 97.8 F (36.6 C), temperature source Oral, resp. rate 22, SpO2 93.00%. General: Well developed, well nourished, in no acute distress. Head: Normocephalic, atraumatic, sclera non-icteric, no xanthomas, nares are without discharge.  Neck: Negative for carotid bruits. JVD not elevated. Lungs: Clear bilaterally to auscultation without wheezes, rales, or rhonchi. Breathing is unlabored. Heart: Irregular rhythm, tachycardic, normal S1S2, no S3. No murmurs appreciated. Abdomen: Soft, non-tender, non-distended with normoactive bowel sounds. No hepatomegaly. No rebound/guarding. No obvious abdominal masses. Msk:  Strength and tone appear normal for age. Extremities: No clubbing or cyanosis. No edema.  Distal pedal pulses are 2+ and equal bilaterally. Neuro: Alert and oriented X 3. No facial asymmetry. No focal deficit. Moves all extremities spontaneously. Psych:  Responds to questions appropriately with a normal affect.   Assessment and Plan:  1. Atrial fibrillation with RVR: Possibly precipitated by right foot pain, although no obvious signs of infection by xray. No WBC/fevers. TSH pending. Moderate left atrial dilatation in 12/2012. Entirely asymptomatic from a  cardiovascular perspective. Will aim to control HR with diltiazem infusion. EF only mildly reduced in 12/2012 and no signs of heart failure at present. CHADS-VASC score of 5 (CHF, HTN, stroke, CAD), thus anticoagulation is indicated. Given reduced GFR, will initiate Xarelto 15 mg daily.   2. Chronic systolic heart failure/nonischemic  cardiomyopathy: No signs of heart failure at present. Continue present doses of Coreg, hydralazine, and Lasix. No ACEI/ARB due to CKD.  3. CAD: Stable ischemic heart disease. Continue ASA, statin, and Coreg.  4. Essential HTN: Controlled on present therapy.  5. Hyperlipidemia: On high intensity statin therapy, pravastatin 80 mg daily.  6. CKD stage III-IV: Will initiate reduced dose of Xarelto for anticoagulation purposes. No ACEI/ARB.  7. Foot pain: Consider further imaging with CT.  8. ICD: Normal function on 10/09/13 with no switches.  Signed, Melina Copa PA-C 12/25/2013, 7:22 PM

## 2013-12-25 NOTE — Progress Notes (Signed)
ANTICOAGULATION CONSULT NOTE - Initial Consult  Pharmacy Consult for Heparin Indication: atrial fibrillation  Allergies  Allergen Reactions  . Codeine     unknown  . Hydrocodone-Acetaminophen     unknown    Patient Measurements: Height: 6\' 3"  (190.5 cm) Weight: 301 lb (136.533 kg) IBW/kg (Calculated) : 84.5 Heparin Dosing Weight: 114 kg  Vital Signs: Temp: 97.8 F (36.6 C) (08/26 1550) Temp src: Oral (08/26 1550) BP: 128/109 mmHg (08/26 1900) Pulse Rate: 131 (08/26 1848)  Labs:  Recent Labs  12/25/13 1712  HGB 16.3  HCT 48.1  PLT 173  CREATININE 1.88*    Estimated Creatinine Clearance: 69.2 ml/min (by C-G formula based on Cr of 1.88).   Medical History: Past Medical History  Diagnosis Date  . Chronic systolic congestive heart failure     Acute decompensation, LVEF less than 20%  . Hypertension   . History of cocaine abuse   . Bronchial asthma   . Benzodiazepine dependence   . Anxiety   . Depression   . Microcytic anemia   . CKD (chronic kidney disease), stage IV     Stage 3-4  . 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 PAF (paroxysmal atrial fibrillation)     a. Episode in 2008 with spontaneous conversion  . CAD (coronary artery disease)     a. Cath 03/2010: mod RCA stenosis, severe diag stenosis, treated medically given lack of angina.  . Stroke 2004  . S/P implantation of automatic cardioverter/defibrillator (AICD)     a. Medtronic, implanted 2012.    Medications:  Scheduled:   Infusions:  . diltiazem     And  . diltiazem (CARDIZEM) infusion     PRN:   Assessment: 53 yo male with CHF, HTN, CAD, prior stroke and CKD presented to [facility] with complaints of foot pain. He was incidentally noted to be in atrial fib with RVR. Diltiazem drip started and Pharmacy consulted to dose IV heparin.  Baseline aPTT, PT/INR pending  CBC wnl  No anticoagulants taken prior to admission  SCr 1.88  No bleeding currently reported   Goal of  Therapy:  Heparin level 0.3-0.7 units/ml Monitor platelets by anticoagulation protocol: Yes   Plan:   Heparin 3000 units IV bolus  Heparin 1800 units/hr (18 ml/hr)  Check heparin level in 8 hours  Heparin level and CBC daily  Peggyann Juba, PharmD, BCPS Pager: [phone] 12/25/2013,7:30 PM

## 2013-12-25 NOTE — ED Notes (Signed)
Pt given urinal and made aware of need for urine specimen 

## 2013-12-25 NOTE — ED Notes (Signed)
Pt c/o rt foot pain since last night.  Denies injury. States that he has arthritis and "no ACL in that knee" so it makes it worse.  Irregular HR on palpation.  States he has a pacemaker and has been hospitalized for same.  Current HR 135.

## 2013-12-25 NOTE — H&P (Addendum)
History and Physical  Cody Ortega WCH:[id number] DOB: [birth year] DOA: 12/25/2013   PCP: Barbette Merino, MD   Chief Complaint: Right foot pain  HPI:  53 year old male with a history of systolic CHF, hypertension, cocaine abuse, CKD stage III, PAF, CAD,  presented with one-day history of right foot pain. The patient stated that he slipped off a step on the stairs and injured his right foot. There was no mechanical fall or syncope. X-rays of the right foot were negative for any fracture or dislocations in the emergency department. During his time in the emergency department, the patient was noted to be in atrial fibrillation with a heart rate in the 140s. The patient was completely asymptomatic and was unaware of his tachycardia. He denied any fevers, chills, chest discomfort, shortness breath, dizziness, vomiting, diarrhea, syncope. He states that he has some nausea this morning after he fell and hurt his foot. Cardiology was consulted in the emergency department and they recommended admission. The patient's heart rate was noted to be up to 140s, but the patient remained hemodynamically stable. Orders were placed in the emergency department to start a diltiazem drip as well as a heparin drip. Urine drug screen was positive for cannabis and benzodiazepines. BMP showed serum creatinine 1.8. CBC was unremarkable.  Assessment/Plan: Atrial fibrillation with rapid ventricular response  -Urine drug screen showed cannabis and benzodiazepines  -Diltiazem drip and heparin drip  -Cardiology has been consulted through the emergency department  -Continue carvedilol  -Echocardiogram  -TSH   coronary artery disease -2011 cardiac catheterization showed two-vessel disease -Continue aspirin -Presently no chest discomfort  CKD stage III -Baseline creatinine 1.3-1.6 -Lasix for this evening -Patient received 1 L normal saline emergency department -Reevaluate clinically before restarting furosemide on  35/36/1443 Chronic systolic CHF - Appears compensated -01/18/2013 echocardiogram shows EF 15-40%, grade 1 diastolic dysfunction -Patient has ICD  hypertension  -Continue carvedilol  -d/c amlodipine as pt is on diltiazem presently Hyperlipidemia  -Continue statin   depression and anxiety -Continue home medications Tobacco abuse -pt was curiously adamant he does not smoke but is part of a "smoking panel" in which he tries cigarettes for compensation      Past Medical History  Diagnosis Date  . Chronic systolic congestive heart failure     Acute decompensation, LVEF less than 20%  . Hypertension   . History of cocaine abuse   . Bronchial asthma   . Benzodiazepine dependence   . Anxiety   . Depression   . Microcytic anemia   . CKD (chronic kidney disease), stage IV     Stage 3-4  . 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 PAF (paroxysmal atrial fibrillation)     a. Episode in 2008 with spontaneous conversion  . CAD (coronary artery disease)     a. Cath 03/2010: mod RCA stenosis, severe diag stenosis, treated medically given lack of angina.  . Stroke 2004  . S/P implantation of automatic cardioverter/defibrillator (AICD)     a. Medtronic, implanted 2012.   Past Surgical History  Procedure Laterality Date  . Tonsillectomy    . Transthoracic echocardiogram  10/2006, 12/2009   Social History:  reports that he has quit smoking. He does not have any smokeless tobacco history on file. He reports that he drinks alcohol. He reports that he does not use illicit drugs.   Family History  Problem Relation Age of Onset  . Breast cancer Mother   . Multiple myeloma Mother   .  Prostate cancer Father   . Diabetes Father   . Cerebral palsy Daughter      Allergies  Allergen Reactions  . Codeine     unknown  . Hydrocodone-Acetaminophen     unknown      Prior to Admission medications   Medication Sig Start Date End Date Taking? Authorizing Provider  albuterol  (PROVENTIL) (2.5 MG/3ML) 0.083% nebulizer solution Take 2.5 mg by nebulization every 6 (six) hours as needed for wheezing or shortness of breath.   Yes Historical Provider, MD  albuterol (VENTOLIN HFA) 108 (90 BASE) MCG/ACT inhaler 2 puffs as needed   Yes Historical Provider, MD  alprazolam Duanne Moron) 2 MG tablet Take one tablet by mouth four times a day   Yes Historical Provider, MD  amLODipine (NORVASC) 10 MG tablet Take 10 mg by mouth daily. In the evening   Yes Historical Provider, MD  aspirin 81 MG EC tablet Take 81 mg by mouth daily.     Yes Historical Provider, MD  carvedilol (COREG) 25 MG tablet Take 25 mg by mouth 2 (two) times daily with a meal.   Yes Historical Provider, MD  ferrous sulfate 325 (65 FE) MG tablet Take 325 mg by mouth 2 (two) times daily with a meal.   Yes Historical Provider, MD  furosemide (LASIX) 40 MG tablet Take 40 mg by mouth 2 (two) times daily. As needed for weight over 300 lbs   Yes Historical Provider, MD  hydrALAZINE (APRESOLINE) 25 MG tablet Take 25 mg by mouth 3 (three) times daily.   Yes Historical Provider, MD  ibuprofen (ADVIL,MOTRIN) 800 MG tablet Take 800 mg by mouth every 8 (eight) hours as needed for moderate pain.   Yes Historical Provider, MD  potassium chloride SA (K-DUR,KLOR-CON) 20 MEQ tablet Take 20 mEq by mouth daily.   Yes Historical Provider, MD  pravastatin (PRAVACHOL) 80 MG tablet Take 80 mg by mouth at bedtime.   Yes Historical Provider, MD  sildenafil (VIAGRA) 100 MG tablet Take 100 mg by mouth daily as needed for erectile dysfunction.   Yes Historical Provider, MD  traZODone (DESYREL) 50 MG tablet Take 50 mg by mouth at bedtime. For sleep   Yes Historical Provider, MD    Review of Systems:  Constitutional:  No weight loss, night sweats, Fevers, chills, fatigue.  Head&Eyes: No headache.  No vision loss.  No eye pain or scotoma ENT:  No Difficulty swallowing,Tooth/dental problems,Sore throat,  No ear ache, post nasal drip,   Cardio-vascular:  No chest pain, Orthopnea, PND, swelling in lower extremities,  dizziness, palpitations  GI:  No  abdominal pain,  vomiting, diarrhea, loss of appetite, hematochezia, melena, heartburn, indigestion, Resp:  No shortness of breath with exertion or at rest. No cough. No coughing up of blood .No wheezing.No chest wall deformity  Skin:  no rash or lesions.  GU:  no dysuria, change in color of urine, no urgency or frequency. No flank pain.  Musculoskeletal:  No joint pain or swelling. No decreased range of motion. No back pain.  Psych:  No change in mood or affect. No depression or anxiety. Neurologic: No headache, no dysesthesia, no focal weakness, no vision loss. No syncope  Physical Exam: Filed Vitals:   12/25/13 1830 12/25/13 1845 12/25/13 1848 12/25/13 1900  BP: 118/81 116/87 116/87 128/109  Pulse: 97 43 131   Temp:      TempSrc:      Resp: $Remo'12 19 16 22  'nDymV$ Height:    '6\' 3"'$  (1.905  m)  Weight:    136.533 kg (301 lb)  SpO2: 95% 98% 93%    General:  A&O x 3, NAD, nontoxic, pleasant/cooperative Head/Eye: No conjunctival hemorrhage, no icterus, [place]/AT, No nystagmus ENT:  No icterus,  No thrush, good dentition, no pharyngeal exudate Neck:  No masses, no lymphadenpathy, no bruits CV:  IRRR, no rub, no gallop, no S3 Lung:  CTAB, good air movement, no wheeze, no rhonchi Abdomen: soft/NT, +BS, nondistended, no peritoneal signs Ext: No cyanosis, No rashes, No petechiae, No lymphangitis, No edema Neuro: CNII-XII intact, strength 4/5 in bilateral upper and lower extremities, no dysmetria  Labs on Admission:  Basic Metabolic Panel:  Recent Labs Lab 12/25/13 1712  NA 137  K 3.8  CL 100  CO2 23  GLUCOSE 109*  BUN 27*  CREATININE 1.88*  CALCIUM 9.4   Liver Function Tests:  Recent Labs Lab 12/25/13 1712  AST 61*  ALT 88*  ALKPHOS 63  BILITOT 1.1  PROT 7.8  ALBUMIN 3.7   No results found for this basename: LIPASE, AMYLASE,  in the last 168 hours No results  found for this basename: AMMONIA,  in the last 168 hours CBC:  Recent Labs Lab 12/25/13 1712  WBC 9.1  NEUTROABS 6.2  HGB 16.3  HCT 48.1  MCV 91.3  PLT 173   Cardiac Enzymes:  Recent Labs Lab 12/25/13 1712  TROPONINI <0.30   BNP: No components found with this basename: POCBNP,  CBG: No results found for this basename: GLUCAP,  in the last 168 hours  Radiological Exams on Admission: Dg Chest 2 View  12/25/2013   CLINICAL DATA:  Atrial fibrillation.  Cardiomyopathy.  EXAM: CHEST  2 VIEW  COMPARISON:  PA and lateral chest 02/23/2011.  FINDINGS: There is cardiomegaly without edema. AICD is in place. No pneumothorax or pleural effusion. No focal bony abnormality.  IMPRESSION: Cardiomegaly without acute disease.   Electronically Signed   By: Inge Rise M.D.   On: 12/25/2013 19:44   [name] Complete Right  12/25/2013   CLINICAL DATA:  Pain at the base of the second and third metatarsals; trauma yesterday  EXAM: RIGHT FOOT COMPLETE - 3+ VIEW  COMPARISON:  None.  FINDINGS: The bones of the foot are adequately mineralized. There is no acute fracture nor dislocation. There is no significant degenerative change. The soft tissues are unremarkable. Specific attention to the metatarsal bases reveals no acute bony abnormality. There is a tiny calcific density that lies between the proximal first and second metatarsal shafts.  IMPRESSION: There is no acute bony abnormality of the right foot. If there are strong clinical concerns of occult injury at the tarsometatarsal joint region, further evaluation with MRI or CT scanning would be useful.   Electronically Signed   By: Denasia Venn  [place]   On: 12/25/2013 16:59    EKG: Independently reviewed. Atrial fibrillation rate 144, nonspecific T wave change    Time spent:60 minutes Code Status:   full Family Communication:   No Family at bedside   Veniamin Kincaid, DO  [facility] Pager [phone]  If 7PM-7AM, please contact  night-coverage [email or web address] Password [facility] 12/25/2013, 8:06 PM

## 2013-12-25 NOTE — ED Provider Notes (Signed)
CSN: [id number]     Arrival date & time 12/25/13  1532 History   First MD Initiated Contact with Patient 12/25/13 1554     Chief Complaint  Patient presents with  . Foot Pain     (Consider location/radiation/quality/duration/timing/severity/associated sxs/prior Treatment) Patient is a 53 y.o. male presenting with lower extremity pain. The history is provided by the patient.  Foot Pain This is a new problem. The current episode started yesterday. The problem occurs constantly. The problem has not changed since onset.Pertinent negatives include no chest pain, no abdominal pain, no headaches and no shortness of breath. The symptoms are aggravated by walking. Nothing relieves the symptoms. Treatments tried: ibuprofen. The treatment provided mild relief.    Past Medical History  Diagnosis Date  . Chronic systolic congestive heart failure     Acute decompensation, LVEF less than 20%  . Hypertension   . History of cocaine abuse   . Bronchial asthma   . Benzodiazepine dependence   . Anxiety   . Depression   . Microcytic anemia   . Chronic kidney disease     Stage 3-4  . 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 Tonsillectomy    . Transthoracic echocardiogram  10/2006, 12/2009   Family History  Problem Relation Age of Onset  . Breast cancer Mother   . Multiple myeloma Mother   . Prostate cancer Father   . Diabetes Father   . Cerebral palsy Daughter    History  Substance Use Topics  . Smoking status: Former Research scientist (life sciences)  . Smokeless tobacco: Not on file  . Alcohol Use: Yes     Comment: occ    Review of Systems  Constitutional: Negative for fever.  HENT: Negative for drooling and rhinorrhea.   Eyes: Negative for pain.  Respiratory: Negative for cough and shortness of breath.   Cardiovascular: Negative for chest pain and leg swelling.  Gastrointestinal: Negative for nausea, vomiting, abdominal pain and diarrhea.   Genitourinary: Negative for dysuria and hematuria.  Musculoskeletal: Negative for gait problem and neck pain.  Skin: Negative for color change.  Neurological: Negative for numbness and headaches.  Hematological: Negative for adenopathy.  Psychiatric/Behavioral: Negative for behavioral problems.  All other systems reviewed and are negative.     Allergies  Codeine and Hydrocodone-acetaminophen  Home Medications   Prior to Admission medications   Medication Sig Start Date End Date Taking? Authorizing Provider  albuterol (PROVENTIL) (2.5 MG/3ML) 0.083% nebulizer solution Take 2.5 mg by nebulization every 6 (six) hours as needed for wheezing or shortness of breath.    Historical Provider, MD  albuterol (VENTOLIN HFA) 108 (90 BASE) MCG/ACT inhaler 2 puffs as needed    Historical Provider, MD  alprazolam Duanne Moron) 2 MG tablet Take one tablet by mouth four times a day    Historical Provider, MD  amLODipine (NORVASC) 10 MG tablet TAKE 1 TABLET BY MOUTH EVERY DAY 11/19/13   [name], MD  aspirin 81 MG EC tablet Take 81 mg by mouth daily.      Historical Provider, MD  carvedilol (COREG) 25 MG tablet TAKE 1 TABLET BY MOUTH TWICE DAILY    [name], MD  ferrous sulfate 325 (65 FE) MG tablet TAKE ONE TABLET BY MOUTH TWICE DAILY 04/16/13   Evans Lance, MD  furosemide (LASIX) 40 MG tablet TAKE 1 TABLET BY MOUTH TWICE DAILY AND AS NEEDED FOR WEIGHT OVER 300LBS 12/23/12   Champ Mungo  Lovena Le, MD  hydrALAZINE (APRESOLINE) 25 MG tablet TAKE 1 TABLET BY MOUTH THREE TIMES DAILY  FOR BLOOD PRESSURE 10/18/13   [name], MD  IBUPROFEN PO TAKE 800MG AS NEEDED FOR PAIN    Historical Provider, MD  potassium chloride SA (K-DUR,KLOR-CON) 20 MEQ tablet TAKE 2 TABLETS BY MOUTH DAILY 12/23/13   Evans Lance, MD  pravastatin (PRAVACHOL) 80 MG tablet TAKE 1 TABLET BY MOUTH EVERY NIGHT AT BEDTIME    [name], MD  traZODone (DESYREL) 50 MG tablet Take 50 mg by mouth at bedtime. As needed     Historical  Provider, MD  VIAGRA 100 MG tablet TAKE 1 TABLET BY MOUTH DAILY AS NEEDED FOR ERECTILE DYSFUNCTION 03/23/13   [name], MD   BP 124/91  Pulse 135  Temp(Src) 97.8 F (36.6 C) (Oral)  Resp 18  SpO2 100% Physical Exam  Nursing note and vitals reviewed. Constitutional: He is oriented to person, place, and time. He appears well-developed and well-nourished.  HENT:  Head: Normocephalic and atraumatic.  Right Ear: External ear normal.  Left Ear: External ear normal.  Nose: Nose normal.  Mouth/Throat: Oropharynx is clear and moist. No oropharyngeal exudate.  Eyes: Conjunctivae and EOM are normal. Pupils are equal, round, and reactive to light.  Neck: Normal range of motion. Neck supple.  Cardiovascular: Normal heart sounds and intact distal pulses.  Exam reveals no gallop and no friction rub.   No murmur heard. HR 132  Pulmonary/Chest: Effort normal and breath sounds normal. No respiratory distress. He has no wheezes.  Abdominal: Soft. Bowel sounds are normal. He exhibits no distension. There is no tenderness. There is no rebound and no guarding.  Musculoskeletal: Normal range of motion. He exhibits tenderness. He exhibits no edema.  Mild tenderness to palpation at the base of the second and third digits of the right foot. Normal range of motion of the right ankle without pain.  2+ distal pulses in the bilateral lower extremities.  Normal capillary refill in bilateral lower extremities  Neurological: He is alert and oriented to person, place, and time.  Skin: Skin is warm and dry.  Psychiatric: He has a normal mood and affect. His behavior is normal.    ED Course  Procedures (including critical care time) Labs Review Labs Reviewed  CBC WITH DIFFERENTIAL - Abnormal; Notable for the following:    Monocytes Absolute 1.1 (*)    All other components within normal limits  COMPREHENSIVE METABOLIC PANEL - Abnormal; Notable for the following:    Glucose, Bld 109 (*)    BUN 27 (*)     Creatinine, Ser 1.88 (*)    AST 61 (*)    ALT 88 (*)    GFR calc non Af Amer 40 (*)    GFR calc Af Amer 46 (*)    All other components within normal limits  URINE RAPID DRUG SCREEN (HOSP PERFORMED) - Abnormal; Notable for the following:    Benzodiazepines POSITIVE (*)    Tetrahydrocannabinol POSITIVE (*)    All other components within normal limits  MRSA PCR SCREENING  TROPONIN I  PROTIME-INR  APTT  CBC  COMPREHENSIVE METABOLIC PANEL  TSH  URINALYSIS, ROUTINE W REFLEX MICROSCOPIC    Imaging Review Dg Chest 2 View  12/25/2013   CLINICAL DATA:  Atrial fibrillation.  Cardiomyopathy.  EXAM: CHEST  2 VIEW  COMPARISON:  PA and lateral chest 02/23/2011.  FINDINGS: There is cardiomegaly without edema. AICD is in place. No pneumothorax or pleural effusion. No  focal bony abnormality.  IMPRESSION: Cardiomegaly without acute disease.   Electronically Signed   By: Inge Rise M.D.   On: 12/25/2013 19:44   [name] Complete Right  12/25/2013   CLINICAL DATA:  Pain at the base of the second and third metatarsals; trauma yesterday  EXAM: RIGHT FOOT COMPLETE - 3+ VIEW  COMPARISON:  None.  FINDINGS: The bones of the foot are adequately mineralized. There is no acute fracture nor dislocation. There is no significant degenerative change. The soft tissues are unremarkable. Specific attention to the metatarsal bases reveals no acute bony abnormality. There is a tiny calcific density that lies between the proximal first and second metatarsal shafts.  IMPRESSION: There is no acute bony abnormality of the right foot. If there are strong clinical concerns of occult injury at the tarsometatarsal joint region, further evaluation with MRI or CT scanning would be useful.   Electronically Signed   By: David  [place]   On: 12/25/2013 16:59     EKG Interpretation   Date/Time:  Wednesday December 25 2013 18:20:51 EDT Ventricular Rate:  144 PR Interval:    QRS Duration: 105 QT Interval:  367 QTC Calculation:  568 R Axis:   -10 Text Interpretation:  Atrial fibrillation Anterior infarct, old  Nonspecific T abnormalities, lateral leads Prolonged QT interval Baseline  wander in lead(s) III aVL aVF No significant change since last tracing  Confirmed by Leola Fiore  MD, Lindsea Olivar ([id number]) on 12/25/2013 6:25:07 PM      MDM   Final diagnoses:  Atrial fibrillation with RVR  Right foot pain    4:12 PM 53 y.o. male w hx of CHF (EF 45-50% in 9/14), anxiety, CKD who presents with right foot pain. He states that his symptoms began yesterday but denies any known injury. He was incidentally found to have a heart rate in the 130s here. He denies any chest pain or shortness of breath. He states that he is otherwise been well. He states that he is anxious and has not taken his Xanax today. He is also having pain in his right foot. These things could lead to his higher heart rate. Will give him a small bolus, Tylenol, his Xanax, get plain film and reevaluate.  HR remains in the 120's-140's. Discussed briefly w/ Dr. Ellyn Hack will try 2.38m metop to break.   Did not work and HR remains in the 130's-150's. Will start dilt gtt and admit to hospitalist.     FPamella Pert MD 12/25/13 [phone]

## 2013-12-25 NOTE — Progress Notes (Signed)
Cardiology saw patient after admission.    They wanted to start rivaroxaban 15mg  daily.  Therefore, d/c heparin drip.  D/C amlodipine as pt is diltiazem  Cody Ortega

## 2013-12-26 DIAGNOSIS — I517 Cardiomegaly: Secondary | ICD-10-CM

## 2013-12-26 DIAGNOSIS — I10 Essential (primary) hypertension: Secondary | ICD-10-CM

## 2013-12-26 LAB — URINE MICROSCOPIC-ADD ON

## 2013-12-26 LAB — COMPREHENSIVE METABOLIC PANEL
ALT: 77 U/L — AB (ref 0–53)
AST: 47 U/L — AB (ref 0–37)
Albumin: 3.3 g/dL — ABNORMAL LOW (ref 3.5–5.2)
Alkaline Phosphatase: 55 U/L (ref 39–117)
Anion gap: 12 (ref 5–15)
BILIRUBIN TOTAL: 0.8 mg/dL (ref 0.3–1.2)
BUN: 24 mg/dL — ABNORMAL HIGH (ref 6–23)
CALCIUM: 8.8 mg/dL (ref 8.4–10.5)
CHLORIDE: 100 meq/L (ref 96–112)
CO2: 24 meq/L (ref 19–32)
Creatinine, Ser: 1.67 mg/dL — ABNORMAL HIGH (ref 0.50–1.35)
GFR calc Af Amer: 53 mL/min — ABNORMAL LOW (ref >90)
GFR calc non Af Amer: 46 mL/min — ABNORMAL LOW (ref >90)
Glucose, Bld: 100 mg/dL — ABNORMAL HIGH (ref 70–99)
Potassium: 3 mEq/L — ABNORMAL LOW (ref 3.7–5.3)
SODIUM: 136 meq/L — AB (ref 137–147)
Total Protein: 6.8 g/dL (ref 6.0–8.3)

## 2013-12-26 LAB — CBC
HCT: 45.3 % (ref 39.0–52.0)
Hemoglobin: 15.2 g/dL (ref 13.0–17.0)
MCH: 30.8 pg (ref 26.0–34.0)
MCHC: 33.6 g/dL (ref 30.0–36.0)
MCV: 91.7 fL (ref 78.0–100.0)
PLATELETS: 158 10*3/uL (ref 150–400)
RBC: 4.94 MIL/uL (ref 4.22–5.81)
RDW: 14.2 % (ref 11.5–15.5)
WBC: 8.7 10*3/uL (ref 4.0–10.5)

## 2013-12-26 LAB — MRSA PCR SCREENING: MRSA by PCR: NEGATIVE

## 2013-12-26 LAB — URINALYSIS, ROUTINE W REFLEX MICROSCOPIC
Bilirubin Urine: NEGATIVE
GLUCOSE, UA: NEGATIVE mg/dL
Hgb urine dipstick: NEGATIVE
Ketones, ur: NEGATIVE mg/dL
LEUKOCYTES UA: NEGATIVE
NITRITE: NEGATIVE
PH: 5 (ref 5.0–8.0)
Protein, ur: 100 mg/dL — AB
Specific Gravity, Urine: 1.018 (ref 1.005–1.030)
Urobilinogen, UA: 0.2 mg/dL (ref 0.0–1.0)

## 2013-12-26 LAB — TSH: TSH: 2.66 u[IU]/mL (ref 0.350–4.500)

## 2013-12-26 MED ORDER — FUROSEMIDE 40 MG PO TABS
40.0000 mg | ORAL_TABLET | Freq: Once | ORAL | Status: AC
  Administered 2013-12-26: 40 mg via ORAL
  Filled 2013-12-26: qty 1

## 2013-12-26 MED ORDER — CETYLPYRIDINIUM CHLORIDE 0.05 % MT LIQD
7.0000 mL | Freq: Two times a day (BID) | OROMUCOSAL | Status: DC
  Administered 2013-12-27 – 2013-12-28 (×2): 7 mL via OROMUCOSAL

## 2013-12-26 MED ORDER — ALPRAZOLAM 1 MG PO TABS
2.0000 mg | ORAL_TABLET | Freq: Four times a day (QID) | ORAL | Status: DC | PRN
  Administered 2013-12-26: 2 mg via ORAL
  Administered 2013-12-26: 1 mg via ORAL
  Administered 2013-12-26 – 2013-12-29 (×8): 2 mg via ORAL
  Filled 2013-12-26 (×10): qty 2

## 2013-12-26 MED ORDER — POTASSIUM CHLORIDE CRYS ER 20 MEQ PO TBCR
40.0000 meq | EXTENDED_RELEASE_TABLET | Freq: Once | ORAL | Status: AC
  Administered 2013-12-26: 40 meq via ORAL
  Filled 2013-12-26: qty 2

## 2013-12-26 MED ORDER — POTASSIUM CHLORIDE CRYS ER 20 MEQ PO TBCR
60.0000 meq | EXTENDED_RELEASE_TABLET | Freq: Every day | ORAL | Status: DC
  Administered 2013-12-26 – 2013-12-29 (×4): 60 meq via ORAL
  Filled 2013-12-26 (×4): qty 3

## 2013-12-26 NOTE — Progress Notes (Signed)
     SUBJECTIVE: No chest pain or SOB. No awareness of irregularity of his heart rhythm. He reports that he is angry and agitated about being here. He did not sleep at all.   BP 133/70  Pulse 56  Temp(Src) 97.9 F (36.6 C) (Oral)  Resp 17  Ht 6\' 3"  (1.905 m)  Wt 306 lb 7 oz (139 kg)  BMI 38.30 kg/m2  SpO2 93%  Intake/Output Summary (Last 24 hours) at 12/26/13 [id number] Last data filed at 12/26/13 0600  Gross per 24 hour  Intake 480.84 ml  Output   1175 ml  Net -694.16 ml    PHYSICAL EXAM General: Well developed, well nourished, in no acute distress. Alert and oriented x 3.  Psych:  Good affect, responds appropriately Neck: No JVD. No masses noted.  Lungs: Clear bilaterally with no wheezes or rhonci noted.  Heart: Irreg tachy with no murmurs noted. Abdomen: Bowel sounds are present. Soft, non-tender.  Extremities: No lower extremity edema.   LABS: Basic Metabolic Panel:  Recent Labs  12/25/13 1712 12/26/13 0340  NA 137 136*  K 3.8 3.0*  CL 100 100  CO2 23 24  GLUCOSE 109* 100*  BUN 27* 24*  CREATININE 1.88* 1.67*  CALCIUM 9.4 8.8   CBC:  Recent Labs  12/25/13 1712 12/26/13 0340  WBC 9.1 8.7  NEUTROABS 6.2  --   HGB 16.3 15.2  HCT 48.1 45.3  MCV 91.3 91.7  PLT 173 158   Cardiac Enzymes:  Recent Labs  12/25/13 1712  TROPONINI <0.30   Current Meds: . atorvastatin  20 mg Oral q1800  . carvedilol  25 mg Oral BID WC  . ferrous sulfate  325 mg Oral BID WC  . rivaroxaban  15 mg Oral Daily  . sodium chloride  3 mL Intravenous Q12H  . traZODone  50 mg Oral QHS    ASSESSMENT AND PLAN:  1. Atrial fibrillation with RVR: Rate 100-130 this am on Cardizem drip at 15 mg/hour. TSH pending. CHADS-VASC score of 5 (CHF, HTN, stroke, CAD), thus anticoagulation is indicated. Given reduced GFR, he was started on Xarelto 15 mg daily. He is on max dose of Coreg. If his rate is not better controlled later this am then would consider stopping the Cardizem drip and  starting an amiodarone drip. He may need short term amiodarone and if he does not convert to sinus after 4 weeks then could have DCCV. If he cannot be controlled on IV Cardizem today or IV amiodarone, could consider TEE guided DCCV tomorrow.   2. Chronic systolic heart failure/nonischemic cardiomyopathy: No signs of heart failure at present. Continue present doses of Coreg, hydralazine, and Lasix. No ACEI/ARB due to CKD.   3. CAD: Stable ischemic heart disease. Continue ASA, statin, and Coreg.   4. HTN: Controlled on present therapy.   5. Hyperlipidemia: Continue statin.    6. CKD, stage III: No ACEI/ARB.    Yolonda Purtle  8/27/20156:28 AM

## 2013-12-26 NOTE — Progress Notes (Signed)
Patient ID: Cody Ortega, male   DOB: December 20, 1960, 52 y.o.   MRN: [id number]  [facility] PROGRESS NOTE  Cody STUDE Ortega DOB: 12-Aug-1960 DOA: 12/25/2013 PCP: Barbette Merino, MD  Brief narrative: 53 year old male with a history of systolic CHF, hypertension, cocaine abuse, CKD stage III, PAF, CAD, presented with one-day history of right foot pain. The patient stated that he slipped off a step on the stairs and injured his right foot. There was no mechanical fall or syncope. X-rays of the right foot were negative for any fracture or dislocations in the emergency department. During his time in the emergency department, the patient was noted to be in atrial fibrillation with a heart rate in the 140s. The patient was completely asymptomatic and was unaware of his tachycardia. He denied any fevers, chills, chest discomfort, shortness breath, dizziness, vomiting, diarrhea, syncope. Cardiology was consulted in the emergency department and they recommended admission. The patient's heart rate was noted to be up to 140s, but the patient remained hemodynamically stable. Orders were placed in the emergency department to start a diltiazem drip as well as a heparin drip. Urine drug screen was positive for cannabis and benzodiazepines. BMP showed serum creatinine 1.8. CBC was unremarkable.   Assessment and Plan:    Assessment/Plan:  Atrial fibrillation with rapid ventricular response  -Urine drug screen showed cannabis and benzodiazepines  -Diltiazem drip and heparin drip initially started but was transitioned to Xarelto  -Cardiology has been consulted, recommendations appreciated  -Continue carvedilol  coronary artery disease  -2011 cardiac catheterization showed two-vessel disease  -Continue aspirin  -Presently no chest discomfort  CKD stage III  -Baseline creatinine 1.3-1.6  -will resume lasix as per home medical regimen  -Patient received 1 L normal saline emergency department, will  stop all IVF  Chronic systolic CHF  -Appears compensated  -01/18/2013 echocardiogram shows EF Q000111Q, grade 1 diastolic dysfunction  -Patient has ICD  -resume Lasix as per home medical regimen  hypertension  -Continue carvedilol  -d/c amlodipine as pt is on diltiazem presently  Hypokalemia -supplement and repeat BMP in AM Hyperlipidemia  -Continue statin  depression and anxiety  -Continue home medications  Tobacco abuse  -pt was curiously adamant he does not smoke but is part of a "smoking panel" in which he tries cigarettes for compensation   DVT prophylaxis  On Xarelto for a-fib  Code Status: Full Family Communication: Pt at bedside Disposition Plan: Home when medically stable   IV Access:   Peripheral IV Procedures and diagnostic studies:   Dg Chest 2 View  12/25/2013   CLINICAL DATA:  Atrial fibrillation.  Cardiomyopathy.  EXAM: CHEST  2 VIEW  COMPARISON:  PA and lateral chest 02/23/2011.  FINDINGS: There is cardiomegaly without edema. AICD is in place. No pneumothorax or pleural effusion. No focal bony abnormality.  IMPRESSION: Cardiomegaly without acute disease.   Electronically Signed   By: Inge Rise M.D.   On: 12/25/2013 19:44   [name] Complete Right  12/25/2013   CLINICAL DATA:  Pain at the base of the second and third metatarsals; trauma yesterday  EXAM: RIGHT FOOT COMPLETE - 3+ VIEW  COMPARISON:  None.  FINDINGS: The bones of the foot are adequately mineralized. There is no acute fracture nor dislocation. There is no significant degenerative change. The soft tissues are unremarkable. Specific attention to the metatarsal bases reveals no acute bony abnormality. There is a tiny calcific density that lies between the proximal first and second metatarsal shafts.  IMPRESSION: There  is no acute bony abnormality of the right foot. If there are strong clinical concerns of occult injury at the tarsometatarsal joint region, further evaluation with MRI or CT scanning would  be useful.   Electronically Signed   By: David  Martinique   On: 12/25/2013 16:59     Medical Consultants:   Cardiology Other Consultants:   None Anti-Infectives:   None  Faye Ramsay, MD  [facility] Pager [phone]  If 7PM-7AM, please contact night-coverage [email or web address] Password [facility] 12/26/2013, 2:57 PM   LOS: 1 day   HPI/Subjective: No events overnight.   Objective: Filed Vitals:   12/26/13 1100 12/26/13 1200 12/26/13 1300 12/26/13 1400  BP: 116/67 103/88  119/78  Pulse: 130   84  Temp:      TempSrc:      Resp: 21 20 23 27   Height:      Weight:      SpO2: 95% 97% 91% 94%    Intake/Output Summary (Last 24 hours) at 12/26/13 1457 Last data filed at 12/26/13 1400  Gross per 24 hour  Intake 600.84 ml  Output   1175 ml  Net -574.16 ml    Exam:   General:  Pt is alert, follows commands appropriately, not in acute distress  Cardiovascular: Irregular rate and rhythm, no rubs, no gallops  Respiratory: Clear to auscultation bilaterally, no wheezing, no crackles, no rhonchi  Abdomen: Soft, non tender, non distended, bowel sounds present, no guarding  Extremities: No edema, pulses DP and PT palpable bilaterally  Data Reviewed: Basic Metabolic Panel:  Recent Labs Lab 12/25/13 1712 12/26/13 0340  NA 137 136*  K 3.8 3.0*  CL 100 100  CO2 23 24  GLUCOSE 109* 100*  BUN 27* 24*  CREATININE 1.88* 1.67*  CALCIUM 9.4 8.8   Liver Function Tests:  Recent Labs Lab 12/25/13 1712 12/26/13 0340  AST 61* 47*  ALT 88* 77*  ALKPHOS 63 55  BILITOT 1.1 0.8  PROT 7.8 6.8  ALBUMIN 3.7 3.3*   CBC:  Recent Labs Lab 12/25/13 1712 12/26/13 0340  WBC 9.1 8.7  NEUTROABS 6.2  --   HGB 16.3 15.2  HCT 48.1 45.3  MCV 91.3 91.7  PLT 173 158   Cardiac Enzymes:  Recent Labs Lab 12/25/13 1712  TROPONINI <0.30     Recent Results (from the past 240 hour(s))  MRSA PCR SCREENING     Status: None   Collection Time    12/25/13 10:00 PM      Result Value Ref  Range Status   MRSA by PCR NEGATIVE  NEGATIVE Final   Comment:            The GeneXpert MRSA Assay (FDA     approved for NASAL specimens     only), is one component of a     comprehensive MRSA colonization     surveillance program. It is not     intended to diagnose MRSA     infection nor to guide or     monitor treatment for     MRSA infections.     Scheduled Meds: . antiseptic oral rinse  7 mL Mouth Rinse BID  . atorvastatin  20 mg Oral q1800  . carvedilol  25 mg Oral BID WC  . ferrous sulfate  325 mg Oral BID WC  . potassium chloride  60 mEq Oral Daily  . rivaroxaban  15 mg Oral Daily  . sodium chloride  3 mL Intravenous Q12H  . traZODone  50  mg Oral QHS   Continuous Infusions: . diltiazem (CARDIZEM) infusion 15 mg/hr (12/26/13 1254)

## 2013-12-26 NOTE — Progress Notes (Signed)
CARE MANAGEMENT NOTE 12/26/2013  Patient:  Cody Ortega, Cody Ortega   Account Number:  [id number]  Date Initiated:  12/26/2013  Documentation initiated by:  DAVIS,RHONDA  Subjective/Objective Assessment:   a.fib with rvr, poss ankle fracture after fall. asymptomatic of a.fib/hr-140's     Action/Plan:   tbd/ patient new finding of a.fib/may need tee.   Anticipated DC Date:  12/29/2013   Anticipated DC Plan:  HOME/SELF CARE  In-house referral  NA      DC Planning Services  NA      [facility] Choice  NA   Choice offered to / List presented to:  NA   DME arranged  NA      DME agency  NA     [place] arranged  NA      [place] agency  NA   Status of service:  In process, will continue to follow Medicare Important Message given?  NA - LOS <3 / Initial given by admissions (If response is "NO", the following Medicare IM given date fields will be blank) Date Medicare IM given:   Medicare IM given by:   Date Additional Medicare IM given:   Additional Medicare IM given by:    Discharge Disposition:    Per UR Regulation:  Reviewed for med. necessity/level of care/duration of stay  If discussed at [place] of Stay Meetings, dates discussed:    Comments:  Suanne Marker Davis,RN,BSN,CCM

## 2013-12-26 NOTE — Progress Notes (Signed)
*  PRELIMINARY RESULTS* Echocardiogram 2D Echocardiogram has been performed.  Cody Ortega 12/26/2013, 12:07 PM

## 2013-12-27 LAB — CBC
HEMATOCRIT: 45.1 % (ref 39.0–52.0)
Hemoglobin: 15 g/dL (ref 13.0–17.0)
MCH: 30.5 pg (ref 26.0–34.0)
MCHC: 33.3 g/dL (ref 30.0–36.0)
MCV: 91.9 fL (ref 78.0–100.0)
Platelets: 155 10*3/uL (ref 150–400)
RBC: 4.91 MIL/uL (ref 4.22–5.81)
RDW: 14.2 % (ref 11.5–15.5)
WBC: 11.4 10*3/uL — ABNORMAL HIGH (ref 4.0–10.5)

## 2013-12-27 LAB — BASIC METABOLIC PANEL
Anion gap: 12 (ref 5–15)
BUN: 24 mg/dL — AB (ref 6–23)
CALCIUM: 9 mg/dL (ref 8.4–10.5)
CO2: 22 mEq/L (ref 19–32)
CREATININE: 1.56 mg/dL — AB (ref 0.50–1.35)
Chloride: 106 mEq/L (ref 96–112)
GFR, EST AFRICAN AMERICAN: 58 mL/min — AB (ref >90)
GFR, EST NON AFRICAN AMERICAN: 50 mL/min — AB (ref >90)
Glucose, Bld: 100 mg/dL — ABNORMAL HIGH (ref 70–99)
Potassium: 4.4 mEq/L (ref 3.7–5.3)
Sodium: 140 mEq/L (ref 137–147)

## 2013-12-27 MED ORDER — AMIODARONE HCL IN DEXTROSE 360-4.14 MG/200ML-% IV SOLN
60.0000 mg/h | INTRAVENOUS | Status: AC
  Administered 2013-12-27 (×2): 60 mg/h via INTRAVENOUS
  Filled 2013-12-27 (×2): qty 200

## 2013-12-27 MED ORDER — AMIODARONE HCL IN DEXTROSE 360-4.14 MG/200ML-% IV SOLN
30.0000 mg/h | INTRAVENOUS | Status: DC
  Administered 2013-12-27 – 2013-12-28 (×5): 30 mg/h via INTRAVENOUS
  Filled 2013-12-27 (×8): qty 200

## 2013-12-27 MED ORDER — AMIODARONE LOAD VIA INFUSION
150.0000 mg | Freq: Once | INTRAVENOUS | Status: AC
  Administered 2013-12-27: 150 mg via INTRAVENOUS
  Filled 2013-12-27: qty 83.34

## 2013-12-27 NOTE — Progress Notes (Signed)
Patient ID: JOBE CRISTE, male   DOB: 1960-05-10, 53 y.o.   MRN: [id number]  [facility] PROGRESS NOTE  SLATON COURTER [id number] DOB: 07/26/1960 DOA: 12/25/2013 PCP: Barbette Merino, MD  Brief narrative:  53 year old male with a history of systolic CHF, hypertension, cocaine abuse, CKD stage III, PAF, CAD, presented with one-day history of right foot pain. The patient stated that he slipped off a step on the stairs and injured his right foot. There was no mechanical fall or syncope. X-rays of the right foot were negative for any fracture or dislocations in the emergency department. During his time in the emergency department, the patient was noted to be in atrial fibrillation with a heart rate in the 140s. The patient was completely asymptomatic and was unaware of his tachycardia. He denied any fevers, chills, chest discomfort, shortness breath, dizziness, vomiting, diarrhea, syncope. Cardiology was consulted in the emergency department and they recommended admission. The patient's heart rate was noted to be up to 140s, but the patient remained hemodynamically stable. Orders were placed in the emergency department to start a diltiazem drip as well as a heparin drip. Urine drug screen was positive for cannabis and benzodiazepines. BMP showed serum creatinine 1.8. CBC was unremarkable.   Assessment and Plan:   Assessment/Plan:  Atrial fibrillation with rapid ventricular response  -Urine drug screen showed cannabis and benzodiazepines  -Diltiazem drip and heparin drip initially started but was transitioned to Xarelto  -Cardiology has been consulted, recommendations appreciated  -started on Amio drip, will continue to follow up on cardio rec's coronary artery disease  -2011 cardiac catheterization showed two-vessel disease  -Continue aspirin  -Presently no chest discomfort  CKD stage III  -Baseline creatinine 1.3-1.6  -continue lasix as per home medical regimen  Transaminitis -LFT's  trending down  Chronic systolic CHF  -Appears compensated  -01/18/2013 echocardiogram shows EF Q000111Q, grade 1 diastolic dysfunction  -Patient has ICD  -Lasix as per home medical regimen  hypertension  -Continue carvedilol  -d/c amlodipine as pt is on diltiazem presently  Hypokalemia  -supplemented  Hyperlipidemia  -Continue statin  depression and anxiety  -Continue home medications  Tobacco abuse  -pt was curiously adamant he does not smoke but is part of a "smoking panel" in which he tries cigarettes for compensation   DVT prophylaxis  On Xarelto for a-fib  Code Status: Full  Family Communication: Pt at bedside  Disposition Plan: Home when medically stable   IV Access:   Peripheral IV Procedures and diagnostic studies:   Dg Chest 2 View  12/25/2013 Cardiomegaly without acute disease.  [name] Complete Right   12/25/2013 There is no acute bony abnormality of the right foot. If there are strong clinical concerns of occult injury at the tarsometatarsal joint region, further evaluation with MRI or CT scanning would be useful. Medical Consultants:   Cardiology Other Consultants:   None Anti-Infectives:   None  Faye Ramsay, MD  [facility] Pager [phone]  If 7PM-7AM, please contact night-coverage [email or web address] Password [facility] 12/27/2013, 7:46 PM   LOS: 2 days   HPI/Subjective: No events overnight.   Objective: Filed Vitals:   12/27/13 0749 12/27/13 0815 12/27/13 1005 12/27/13 1326  BP: 145/92 131/92  104/88  Pulse: 109 126 122 135  Temp:    98.5 F (36.9 C)  TempSrc:    Oral  Resp:    24  Height:      Weight:      SpO2:    92%    Intake/Output Summary (  Last 24 hours) at 12/27/13 1946 Last data filed at 12/27/13 0600  Gross per 24 hour  Intake 167.75 ml  Output      0 ml  Net 167.75 ml    Exam:   General:  Pt is alert, follows commands appropriately, not in acute distress  Cardiovascular: irregular rate and rhythm, no rubs, no  gallops  Respiratory: Clear to auscultation bilaterally, no wheezing, diminished breath sounds at bases   Abdomen: Soft, non tender, non distended, bowel sounds present, no guarding  Data Reviewed: Basic Metabolic Panel:  Recent Labs Lab 12/25/13 1712 12/26/13 0340 12/27/13 0021  NA 137 136* 140  K 3.8 3.0* 4.4  CL 100 100 106  CO2 23 24 22   GLUCOSE 109* 100* 100*  BUN 27* 24* 24*  CREATININE 1.88* 1.67* 1.56*  CALCIUM 9.4 8.8 9.0   Liver Function Tests:  Recent Labs Lab 12/25/13 1712 12/26/13 0340  AST 61* 47*  ALT 88* 77*  ALKPHOS 63 55  BILITOT 1.1 0.8  PROT 7.8 6.8  ALBUMIN 3.7 3.3*   No results found for this basename: LIPASE, AMYLASE,  in the last 168 hours No results found for this basename: AMMONIA,  in the last 168 hours CBC:  Recent Labs Lab 12/25/13 1712 12/26/13 0340 12/27/13 0021  WBC 9.1 8.7 11.4*  NEUTROABS 6.2  --   --   HGB 16.3 15.2 15.0  HCT 48.1 45.3 45.1  MCV 91.3 91.7 91.9  PLT 173 158 155   Cardiac Enzymes:  Recent Labs Lab 12/25/13 1712  TROPONINI <0.30    Recent Results (from the past 240 hour(s))  MRSA PCR SCREENING     Status: None   Collection Time    12/25/13 10:00 PM      Result Value Ref Range Status   MRSA by PCR NEGATIVE  NEGATIVE Final   Comment:            The GeneXpert MRSA Assay (FDA     approved for NASAL specimens     only), is one component of a     comprehensive MRSA colonization     surveillance program. It is not     intended to diagnose MRSA     infection nor to guide or     monitor treatment for     MRSA infections.     Scheduled Meds: . antiseptic oral rinse  7 mL Mouth Rinse BID  . atorvastatin  20 mg Oral q1800  . carvedilol  25 mg Oral BID WC  . ferrous sulfate  325 mg Oral BID WC  . potassium chloride  60 mEq Oral Daily  . rivaroxaban  15 mg Oral Daily  . sodium chloride  3 mL Intravenous Q12H  . traZODone  50 mg Oral QHS   Continuous Infusions: . amiodarone 30 mg/hr (12/27/13  1414)

## 2013-12-27 NOTE — Progress Notes (Signed)
Pt had 6 beats of V tach. Asymptomatic. MD notified. Will continue to monitor.

## 2013-12-27 NOTE — Discharge Instructions (Signed)
Information on my medicine - XARELTO (Rivaroxaban)  This medication education was reviewed with me or my healthcare representative as part of my discharge preparation.  The pharmacist that spoke with me during my hospital stay was:  Lolita Patella, [facility]  Why was Xarelto prescribed for you? Xarelto was prescribed for you to reduce the risk of a blood clot forming that can cause a stroke if you have a medical condition called atrial fibrillation (a type of irregular heartbeat).  What do you need to know about xarelto ? Take your Xarelto ONCE DAILY at the same time every day with your evening meal. If you have difficulty swallowing the tablet whole, you may crush it and mix in applesauce just prior to taking your dose.  Take Xarelto exactly as prescribed by your doctor and DO NOT stop taking Xarelto without talking to the doctor who prescribed the medication.  Stopping without other stroke prevention medication to take the place of Xarelto may increase your risk of developing a clot that causes a stroke.  Refill your prescription before you run out.  After discharge, you should have regular check-up appointments with your healthcare provider that is prescribing your Xarelto.  In the future your dose may need to be changed if your kidney function or weight changes by a significant amount.  What do you do if you miss a dose? If you are taking Xarelto ONCE DAILY and you miss a dose, take it as soon as you remember on the same day then continue your regularly scheduled once daily regimen the next day. Do not take two doses of Xarelto at the same time or on the same day.   Important Safety Information A possible side effect of Xarelto is bleeding. You should call your healthcare provider right away if you experience any of the following:   Bleeding from an injury or your nose that does not stop.   Unusual colored urine (red or dark brown) or unusual colored stools (red or black).   Unusual bruising for unknown reasons.   A serious fall or if you hit your head (even if there is no bleeding).  Some medicines may interact with Xarelto and might increase your risk of bleeding while on Xarelto. To help avoid this, consult your healthcare provider or pharmacist prior to using any new prescription or non-prescription medications, including herbals, vitamins, non-steroidal anti-inflammatory drugs (NSAIDs) and supplements.  This website has more information on Xarelto: [email or web address].[email or web address]

## 2013-12-27 NOTE — Progress Notes (Signed)
Pt reporting anxiety due to being placed on NPO status. No other order was placed by Cardiology. Pt wants water because he states he is nervous and his mouth is dry. RN currently unaware of why patient was placed on NPO status. Pt given swabs to keep mouth moist. Will contineu to monitor and alert day shift nurse.

## 2013-12-27 NOTE — Progress Notes (Signed)
SUBJECTIVE: c/o SOB this am. Very anxious and rude during my examination.   BP 119/82  Pulse 147  Temp(Src) 98.1 F (36.7 C) (Oral)  Resp 22  Ht 6\' 3"  (1.905 m)  Wt 306 lb 7 oz (139 kg)  BMI 38.30 kg/m2  SpO2 90%  Intake/Output Summary (Last 24 hours) at 12/27/13 [id number] Last data filed at 12/27/13 0600  Gross per 24 hour  Intake   1760 ml  Output      0 ml  Net   1760 ml    PHYSICAL EXAM General: Well developed, well nourished, in no acute distress. Alert and oriented x 3.  Psych:  Good affect, responds appropriately Neck: No JVD. No masses noted.  Lungs: Clear bilaterally with no wheezes or rhonci noted.  Heart: Irreg irreg with no murmurs noted. Abdomen: Bowel sounds are present. Soft, non-tender.  Extremities: No lower extremity edema.   LABS: Basic Metabolic Panel:  Recent Labs  12/26/13 0340 12/27/13 0021  NA 136* 140  K 3.0* 4.4  CL 100 106  CO2 24 22  GLUCOSE 100* 100*  BUN 24* 24*  CREATININE 1.67* 1.56*  CALCIUM 8.8 9.0   CBC:  Recent Labs  12/25/13 1712 12/26/13 0340 12/27/13 0021  WBC 9.1 8.7 11.4*  NEUTROABS 6.2  --   --   HGB 16.3 15.2 15.0  HCT 48.1 45.3 45.1  MCV 91.3 91.7 91.9  PLT 173 158 155   Cardiac Enzymes:  Recent Labs  12/25/13 1712  TROPONINI <0.30   Current Meds: . antiseptic oral rinse  7 mL Mouth Rinse BID  . atorvastatin  20 mg Oral q1800  . carvedilol  25 mg Oral BID WC  . ferrous sulfate  325 mg Oral BID WC  . potassium chloride  60 mEq Oral Daily  . rivaroxaban  15 mg Oral Daily  . sodium chloride  3 mL Intravenous Q12H  . traZODone  50 mg Oral QHS   Echo 12/26/13: Left ventricle: The cavity size was normal. Wall thickness was increased in a pattern of moderate LVH. Systolic function was severely reduced. The estimated ejection fraction was in the range of 25% to 30%. Severe hypokinesis of the entire myocardium. - Left atrium: The atrium was severely dilated. - Right ventricle: The cavity size was  mildly dilated. - Right atrium: The atrium was severely dilated.  ASSESSMENT AND PLAN: 53 yo male presented to ED with foot pain and found to be in atrial fib with RVR. He is followed as an outpatient by Dr. Burt Knack for CAD, ischemic cardiomyopathy, chronic systolic CHF, HTN and by Dr. Lovena Le for his ICD.    1. Atrial fibrillation with RVR: Rate still not controlled on high dose IV Cardizem drip and beta blocker.Not clear how long he has been in atrial fibrillation before presenting to the ED.  Options today would be IV amiodarone to attempt better rate control vs TEE guided DCCV. CHADS-VASC score of 5 (CHF, HTN, stroke, CAD). Anticoagulation is indicated. Given reduced GFR, he was started on Xarelto 15 mg daily. -I have had a long discussion regarding both IV amiodarone and TEE guided DCCV. He is very angry that he is in the hospital and multiple times during me examination he becomes very rude when discussing this. He wishes to go home without any further treatment. He understands after our discussion that this is not a prison and at any time he can leave against medical advice. I have offered TEE guided DCCV  today but he refuses. I will stop IV Cardizem and start IV amiodarone. Hopefully can get better rate control after loading IV amiodarone and convert to po amiodarone over the weekend. He is agreeable with this plan.   2. Chronic systolic heart failure/nonischemic cardiomyopathy: LVEF=25% by echo 12/26/13. No signs of heart failure at present. Continue present doses of Coreg, hydralazine, and Lasix. No ACEI/ARB due to CKD.   3. CAD: Stable ischemic heart disease. Continue ASA, statin, and Coreg.   4. HTN: Controlled on present therapy.   5. Hyperlipidemia: Continue statin.   6. CKD, stage III: No ACEI/ARB.    MCALHANY,CHRISTOPHER  8/28/20156:58 AM

## 2013-12-28 LAB — COMPREHENSIVE METABOLIC PANEL
ALT: 52 U/L (ref 0–53)
AST: 28 U/L (ref 0–37)
Albumin: 3.1 g/dL — ABNORMAL LOW (ref 3.5–5.2)
Alkaline Phosphatase: 55 U/L (ref 39–117)
Anion gap: 11 (ref 5–15)
BUN: 25 mg/dL — ABNORMAL HIGH (ref 6–23)
CALCIUM: 8.9 mg/dL (ref 8.4–10.5)
CO2: 22 mEq/L (ref 19–32)
CREATININE: 1.83 mg/dL — AB (ref 0.50–1.35)
Chloride: 107 mEq/L (ref 96–112)
GFR, EST AFRICAN AMERICAN: 48 mL/min — AB (ref >90)
GFR, EST NON AFRICAN AMERICAN: 41 mL/min — AB (ref >90)
GLUCOSE: 127 mg/dL — AB (ref 70–99)
Potassium: 4.6 mEq/L (ref 3.7–5.3)
Sodium: 140 mEq/L (ref 137–147)
Total Bilirubin: 0.8 mg/dL (ref 0.3–1.2)
Total Protein: 7.2 g/dL (ref 6.0–8.3)

## 2013-12-28 MED ORDER — AMIODARONE LOAD VIA INFUSION
150.0000 mg | Freq: Once | INTRAVENOUS | Status: AC
  Administered 2013-12-28: 150 mg via INTRAVENOUS
  Filled 2013-12-28: qty 83.34

## 2013-12-28 NOTE — Progress Notes (Signed)
Patient ID: Cody Ortega, male   DOB: 1961/03/14, 53 y.o.   MRN: [id number] [facility] PROGRESS NOTE  ASPEN JHA [id number] DOB: [birth year] DOA: 12/25/2013 PCP: Barbette Merino, MD  Brief narrative:  53 year old male with a history of systolic CHF, hypertension, cocaine abuse, CKD stage III, PAF, CAD, presented with one-day history of right foot pain. The patient stated that he slipped off a step on the stairs and injured his right foot. There was no mechanical fall or syncope. X-rays of the right foot were negative for any fracture or dislocations in the emergency department. During his time in the emergency department, the patient was noted to be in atrial fibrillation with a heart rate in the 140s. The patient was completely asymptomatic and was unaware of his tachycardia. He denied any fevers, chills, chest discomfort, shortness breath, dizziness, vomiting, diarrhea, syncope. Cardiology was consulted in the emergency department and they recommended admission. The patient's heart rate was noted to be up to 140s, but the patient remained hemodynamically stable. Orders were placed in the emergency department to start a diltiazem drip as well as a heparin drip. Urine drug screen was positive for cannabis and benzodiazepines. BMP showed serum creatinine 1.8. CBC was unremarkable.   Assessment and Plan:   Assessment/Plan:  Atrial fibrillation with rapid ventricular response  -Urine drug screen showed cannabis and benzodiazepines  -Diltiazem drip and heparin drip initially started but was transitioned to Xarelto  -Cardiology following, recommendations appreciated  -started on Amio drip, will continue to follow up on cardio rec's  coronary artery disease  -2011 cardiac catheterization showed two-vessel disease  -Continue aspirin  -Presently no chest discomfort  CKD stage III  -Baseline creatinine 1.3-1.6  -continue lasix as per home medical regimen  Transaminitis  -LFT's trending  down  Chronic systolic CHF  -Appears compensated  -01/18/2013 echocardiogram shows EF Q000111Q, grade 1 diastolic dysfunction  -Patient has ICD  -Lasix as per home medical regimen  Hypertension -Continue carvedilol  Hypokalemia  -supplemented and WNL this AM  Hyperlipidemia  -Continue statin  depression and anxiety  -Continue home medications  Tobacco abuse  -pt was curiously adamant he does not smoke but is part of a "smoking panel" in which he tries cigarettes for compensation   DVT prophylaxis  On Xarelto for a-fib  Code Status: Full  Family Communication: Pt at bedside  Disposition Plan: Home when medically stable   IV Access:   Peripheral IV Procedures and diagnostic studies:   Dg Chest 2 View 12/25/2013 Cardiomegaly without acute disease.  [name] Complete Right 12/25/2013 There is no acute bony abnormality of the right foot. If there are strong clinical concerns of occult injury at the tarsometatarsal joint region, further evaluation with MRI or CT scanning would be useful. Medical Consultants:   Cardiology Other Consultants:   None Anti-Infectives:   None  Faye Ramsay, MD  [facility] Pager [phone]  If 7PM-7AM, please contact night-coverage [email or web address] Password [facility] 12/28/2013, 7:24 PM   LOS: 3 days   HPI/Subjective: No events overnight.   Objective: Filed Vitals:   12/27/13 2055 12/28/13 0308 12/28/13 0526 12/28/13 1414  BP: 128/85 110/78 105/85 115/90  Pulse: 111 118 100 82  Temp: 97.6 F (36.4 C)  98.8 F (37.1 C) 98 F (36.7 C)  TempSrc: Oral  Oral Oral  Resp: 22  20 20   Height:      Weight:   139.9 kg (308 lb 6.8 oz)   SpO2: 96% 94% 98% 97%    Intake/Output  Summary (Last 24 hours) at 12/28/13 1924 Last data filed at 12/28/13 1800  Gross per 24 hour  Intake 1143.7 ml  Output      0 ml  Net 1143.7 ml    Exam:   General:  Pt is alert, follows commands appropriately, not in acute distress  Cardiovascular: Irregular rate and rhythm,  no rubs, no gallops  Respiratory: Clear to auscultation bilaterally, no wheezing, diminished breath sounds at bases   Abdomen: Soft, non tender, non distended, bowel sounds present, no guarding  Extremities: No edema, pulses DP and PT palpable bilaterally  Data Reviewed: Basic Metabolic Panel:  Recent Labs Lab 12/25/13 1712 12/26/13 0340 12/27/13 0021 12/28/13 0342  NA 137 136* 140 140  K 3.8 3.0* 4.4 4.6  CL 100 100 106 107  CO2 23 24 22 22   GLUCOSE 109* 100* 100* 127*  BUN 27* 24* 24* 25*  CREATININE 1.88* 1.67* 1.56* 1.83*  CALCIUM 9.4 8.8 9.0 8.9   Liver Function Tests:  Recent Labs Lab 12/25/13 1712 12/26/13 0340 12/28/13 0342  AST 61* 47* 28  ALT 88* 77* 52  ALKPHOS 63 55 55  BILITOT 1.1 0.8 0.8  PROT 7.8 6.8 7.2  ALBUMIN 3.7 3.3* 3.1*   No results found for this basename: LIPASE, AMYLASE,  in the last 168 hours No results found for this basename: AMMONIA,  in the last 168 hours CBC:  Recent Labs Lab 12/25/13 1712 12/26/13 0340 12/27/13 0021  WBC 9.1 8.7 11.4*  NEUTROABS 6.2  --   --   HGB 16.3 15.2 15.0  HCT 48.1 45.3 45.1  MCV 91.3 91.7 91.9  PLT 173 158 155   Cardiac Enzymes:  Recent Labs Lab 12/25/13 1712  TROPONINI <0.30   BNP: No components found with this basename: POCBNP,  CBG: No results found for this basename: GLUCAP,  in the last 168 hours  Recent Results (from the past 240 hour(s))  MRSA PCR SCREENING     Status: None   Collection Time    12/25/13 10:00 PM      Result Value Ref Range Status   MRSA by PCR NEGATIVE  NEGATIVE Final   Comment:            The GeneXpert MRSA Assay (FDA     approved for NASAL specimens     only), is one component of a     comprehensive MRSA colonization     surveillance program. It is not     intended to diagnose MRSA     infection nor to guide or     monitor treatment for     MRSA infections.     Scheduled Meds: . antiseptic oral rinse  7 mL Mouth Rinse BID  . atorvastatin  20 mg  Oral q1800  . carvedilol  25 mg Oral BID WC  . ferrous sulfate  325 mg Oral BID WC  . potassium chloride  60 mEq Oral Daily  . rivaroxaban  15 mg Oral Daily  . sodium chloride  3 mL Intravenous Q12H  . traZODone  50 mg Oral QHS   Continuous Infusions: . amiodarone 30 mg/hr (12/28/13 1355)

## 2013-12-28 NOTE — Progress Notes (Signed)
Patient ID: Cody Ortega, male   DOB: 1960/07/17, 53 y.o.   MRN: [id number]     Subjective:    No palpitations   Objective:   Temp:  [97.6 F (36.4 C)-98.8 F (37.1 C)] 98.8 F (37.1 C) (08/29 0526) Pulse Rate:  [100-135] 100 (08/29 0526) Resp:  [20-24] 20 (08/29 0526) BP: (104-128)/(78-88) 105/85 mmHg (08/29 0526) SpO2:  [92 %-98 %] 98 % (08/29 0526) Weight:  [308 lb 6.8 oz (139.9 kg)] 308 lb 6.8 oz (139.9 kg) (08/29 0526) Last BM Date: 12/26/13  Filed Weights   12/25/13 2151 12/26/13 1825 12/28/13 0526  Weight: 306 lb 7 oz (139 kg) 306 lb 7 oz (139 kg) 308 lb 6.8 oz (139.9 kg)   No intake or output data in the 24 hours ending 12/28/13 0816  Telemetry: afib rates 120  Exam:  General: NAD  Resp: clear anteriorally  Cardiac: irreg, rate 120, no m/r/g, no JVD  GI: abdomen soft, NT, ND  MSK: no LE edema  Neuro: no focal deficits  Psych: appropriat e affect  Lab Results:  Basic Metabolic Panel:  Recent Labs Lab 12/26/13 0340 12/27/13 0021 12/28/13 0342  NA 136* 140 140  K 3.0* 4.4 4.6  CL 100 106 107  CO2 24 22 22   GLUCOSE 100* 100* 127*  BUN 24* 24* 25*  CREATININE 1.67* 1.56* 1.83*  CALCIUM 8.8 9.0 8.9    Liver Function Tests:  Recent Labs Lab 12/25/13 1712 12/26/13 0340 12/28/13 0342  AST 61* 47* 28  ALT 88* 77* 52  ALKPHOS 63 55 55  BILITOT 1.1 0.8 0.8  PROT 7.8 6.8 7.2  ALBUMIN 3.7 3.3* 3.1*    CBC:  Recent Labs Lab 12/25/13 1712 12/26/13 0340 12/27/13 0021  WBC 9.1 8.7 11.4*  HGB 16.3 15.2 15.0  HCT 48.1 45.3 45.1  MCV 91.3 91.7 91.9  PLT 173 158 155    Cardiac Enzymes:  Recent Labs Lab 12/25/13 1712  TROPONINI <0.30    BNP:  Recent Labs  01/02/13 1432  PROBNP 39.0    Coagulation:  Recent Labs Lab 12/25/13 1925  INR 1.14    ECG:   Medications:   Scheduled Medications: . antiseptic oral rinse  7 mL Mouth Rinse BID  . atorvastatin  20 mg Oral q1800  . carvedilol  25 mg Oral BID WC  .  ferrous sulfate  325 mg Oral BID WC  . potassium chloride  60 mEq Oral Daily  . rivaroxaban  15 mg Oral Daily  . sodium chloride  3 mL Intravenous Q12H  . traZODone  50 mg Oral QHS     Infusions: . amiodarone 30 mg/hr (12/28/13 0448)     PRN Medications:  acetaminophen, acetaminophen, albuterol, alprazolam, furosemide, ondansetron (ZOFRAN) IV, ondansetron  11/2013 Echo Study Conclusions  - Left ventricle: The cavity size was normal. Wall thickness was increased in a pattern of moderate LVH. Systolic function was severely reduced. The estimated ejection fraction was in the range of 25% to 30%. Severe hypokinesis of the entire myocardium. - Left atrium: The atrium was severely dilated. - Right ventricle: The cavity size was mildly dilated. - Right atrium: The atrium was severely dilated.  03/2010 Cath  PROCEDURAL FINDINGS:  Aortic pressure 176/102 with a mean of 129, left ventricular pressure 172/15.  CORONARY ANGIOGRAPHY:  RCA:  The RCA is a large, dominant vessel.  The vessel has diffuse plaquing in the proximal and mid vessel.  There is an ulcerated-appearing area in the proximal  vessel with no associated obstructive disease.  At the junction of the mid and distal vessel, there is a focal calcified hazy-appearing lesion graded at 70% stenosis with a "napkin ring" appearance.  The vessel gives off a large PDA Nashay Brickley and a moderate posterolateral Makela Niehoff.  The Zackariah Vanderpol vessels of the RCA have no high-grade obstructive disease present.  Left coronary artery:  The left mainstem is very short.  It is calcified with no obstructive disease.  It divides into the LAD and left circumflex.  LAD:  The LAD is a large-caliber vessel that wraps around the left ventricular apex.  The proximal LAD has mild nonobstructive plaque. Just after a large first diagonal Rafia Shedden, there is a 30-40% stenosis present.  The mid LAD also has 30-40% stenosis with no areas of high- grade stenosis noted.   There is a large first diagonal Lamari Youngers that has an 80-90% eccentric stenosis in its proximal aspect.  Just beyond that area there is segmental plaquing of up to 50% stenosis.  Left circumflex.  The left circumflex has a mild ostial stenosis estimated at 30%.  The vessel courses down and supplies a small first OM and the large second OM Shad Ledvina.  The mid circumflex between the obtuse marginal branches has mild nonobstructive stenosis.  Abdominal aortography:  The abdominal aorta is patent.  There are single renal arteries bilaterally, both of which are patent.  The left renal artery is not well visualized, but does not appear to have significant stenosis present.  FINAL ASSESSMENT: 1. Moderate right coronary artery stenosis. 2. Severe diagonal stenosis. 3. Patent renal arteries. 4. Severe systemic hypertension.    Assessment/Plan    53 yo male hx of chronic systolic HF LVEF Q000111Q, CAD, hx of CVA, CKD, HTN admitted with foot pain, found to be in afib with RVR  1. Afib - CHADS2Vasc score 5, started on xarelto 15mg  daily - rate was not controlled on high dose dilt. Started on IV amio yesterday - amio started yesterday AM, he remains in afib with rates 120s after 24 hr load. Will rebolus 150mg  and then resume continous infusion this morning. With renal function would likely avoid digoxin.  - from notes Dr Angelena Form had discussed TEE cardioversion however he was not in favor at that time.   2. Chronic systolic heart failure - History of NICM by cath 2011(had some CAD but out of proportion to LV function). LV function historically up and down. Echo 12/2009 LVEF 20-25%. He has had a prior ICD placed - LVEF improved, echo 12/2012 LVEF 45-50%. Repeat echo this admit LVEF has decreased again to 25-30%.  - he is currently on coreg 25mg  bid, no ACE/ARB/aldactone due to poor renal function.  - unclear current cause of LVEF drop, potentially tachy mediated as he is fairly asymptomatic with his  afib with RVR. He also reports a viral syndrome a few months ago.  - does not appear volume overloaded by exam     Carlyle Dolly, M.D., F.A.C.C.

## 2013-12-29 LAB — BASIC METABOLIC PANEL
ANION GAP: 14 (ref 5–15)
BUN: 24 mg/dL — ABNORMAL HIGH (ref 6–23)
CHLORIDE: 105 meq/L (ref 96–112)
CO2: 20 meq/L (ref 19–32)
Calcium: 8.9 mg/dL (ref 8.4–10.5)
Creatinine, Ser: 1.73 mg/dL — ABNORMAL HIGH (ref 0.50–1.35)
GFR calc Af Amer: 51 mL/min — ABNORMAL LOW (ref >90)
GFR calc non Af Amer: 44 mL/min — ABNORMAL LOW (ref >90)
GLUCOSE: 91 mg/dL (ref 70–99)
POTASSIUM: 4.4 meq/L (ref 3.7–5.3)
SODIUM: 139 meq/L (ref 137–147)

## 2013-12-29 LAB — CBC
HCT: 44 % (ref 39.0–52.0)
Hemoglobin: 14.4 g/dL (ref 13.0–17.0)
MCH: 29.9 pg (ref 26.0–34.0)
MCHC: 32.7 g/dL (ref 30.0–36.0)
MCV: 91.3 fL (ref 78.0–100.0)
PLATELETS: 154 10*3/uL (ref 150–400)
RBC: 4.82 MIL/uL (ref 4.22–5.81)
RDW: 14 % (ref 11.5–15.5)
WBC: 10.7 10*3/uL — AB (ref 4.0–10.5)

## 2013-12-29 MED ORDER — CARVEDILOL 25 MG PO TABS
37.5000 mg | ORAL_TABLET | Freq: Two times a day (BID) | ORAL | Status: DC
  Filled 2013-12-29 (×2): qty 1

## 2013-12-29 MED ORDER — AMIODARONE HCL 200 MG PO TABS
400.0000 mg | ORAL_TABLET | Freq: Two times a day (BID) | ORAL | Status: DC
  Administered 2013-12-29: 400 mg via ORAL
  Filled 2013-12-29 (×2): qty 2

## 2013-12-29 NOTE — Progress Notes (Signed)
Patient ID: Cody Ortega, male   DOB: Jun 27, 1960, 53 y.o.   MRN: [id number]     Subjective:    No palpitations  Objective:   Temp:  [98 F (36.7 C)-99.3 F (37.4 C)] 98.3 F (36.8 C) (08/30 0652) Pulse Rate:  [62-95] 62 (08/30 0652) Resp:  [20] 20 (08/30 0652) BP: (107-115)/(82-90) 112/89 mmHg (08/30 0652) SpO2:  [96 %-100 %] 96 % (08/30 0652) Weight:  [308 lb 10.3 oz (140 kg)] 308 lb 10.3 oz (140 kg) (08/30 0652) Last BM Date: 12/28/13  Filed Weights   12/26/13 1825 12/28/13 0526 12/29/13 [id number]  Weight: 306 lb 7 oz (139 kg) 308 lb 6.8 oz (139.9 kg) 308 lb 10.3 oz (140 kg)    Intake/Output Summary (Last 24 hours) at 12/29/13 0743 Last data filed at 12/29/13 0400  Gross per 24 hour  Intake 2224.1 ml  Output      0 ml  Net 2224.1 ml    Telemetry: afib variable rates 90s-140s  Exam:  General: NAD  Resp: CTAB  Cardiac: irreg, rate 100, no m/r/g, no JVD  GI: abdomen soft, NT, ND  MSK: no LE edema  Neuro: no focal deficits   Lab Results:  Basic Metabolic Panel:  Recent Labs Lab 12/27/13 0021 12/28/13 0342 12/29/13 0519  NA 140 140 139  K 4.4 4.6 4.4  CL 106 107 105  CO2 22 22 20   GLUCOSE 100* 127* 91  BUN 24* 25* 24*  CREATININE 1.56* 1.83* 1.73*  CALCIUM 9.0 8.9 8.9    Liver Function Tests:  Recent Labs Lab 12/25/13 1712 12/26/13 0340 12/28/13 0342  AST 61* 47* 28  ALT 88* 77* 52  ALKPHOS 63 55 55  BILITOT 1.1 0.8 0.8  PROT 7.8 6.8 7.2  ALBUMIN 3.7 3.3* 3.1*    CBC:  Recent Labs Lab 12/26/13 0340 12/27/13 0021 12/29/13 0519  WBC 8.7 11.4* 10.7*  HGB 15.2 15.0 14.4  HCT 45.3 45.1 44.0  MCV 91.7 91.9 91.3  PLT 158 155 154    Cardiac Enzymes:  Recent Labs Lab 12/25/13 1712  TROPONINI <0.30    BNP:  Recent Labs  01/02/13 1432  PROBNP 39.0    Coagulation:  Recent Labs Lab 12/25/13 1925  INR 1.14    ECG:   Medications:   Scheduled Medications: . antiseptic oral rinse  7 mL Mouth Rinse BID  .  atorvastatin  20 mg Oral q1800  . carvedilol  25 mg Oral BID WC  . ferrous sulfate  325 mg Oral BID WC  . potassium chloride  60 mEq Oral Daily  . rivaroxaban  15 mg Oral Daily  . sodium chloride  3 mL Intravenous Q12H  . traZODone  50 mg Oral QHS     Infusions: . amiodarone 30 mg/hr (12/28/13 2154)     PRN Medications:  acetaminophen, acetaminophen, albuterol, alprazolam, furosemide, ondansetron (ZOFRAN) IV, ondansetron     Assessment/Plan   1. Afib  - CHADS2Vasc score 5, started on xarelto 15mg  daily  - rate was not controlled on high dose dilt. Started on IV amio, yesterday remained in afib despite 24 hrs of amio gtt, rebolused 150mg  and resumed on maintence dose.   - With renal function would likely avoid digoxin. Given his structural heart disease, choice of antiarrhythmis is fairly limited  - from notes Dr Angelena Form had discussed TEE cardioversion however he was not in favor at that time.  - readressed DCCV today, he does not want to stay in hospital  until tomorrow for procedure and would like to sign out AMA. We will change him to oral amio 400mg  bid, increase coreg to 37.5mg  bid (based on body weight acceptable dose) and work to have him see Dr Lovena Le in close follow up per patient request. He understands the associated risks with signing out AMA  2. Chronic systolic heart failure  - History of NICM by cath 2011(had some CAD but out of proportion to LV function). LV function historically up and down. Echo 12/2009 LVEF 20-25%. He has had a prior ICD placed  - LVEF initially improved, echo 12/2012 LVEF 45-50%. Repeat echo this admit LVEF has decreased again to 25-30%.  - he is currently on coreg 25mg  bid, no ACE/ARB/aldactone due to poor renal function.  - unclear current cause of LVEF drop, potentially tachy mediated as he is fairly asymptomatic with his afib with RVR and this may have been present for some time. He also reports a viral syndrome a few months ago.  - does not  appear volume overloaded by exam         Carlyle Dolly, M.D., F.A.C.C.

## 2013-12-31 ENCOUNTER — Ambulatory Visit (INDEPENDENT_AMBULATORY_CARE_PROVIDER_SITE_OTHER): Payer: Medicaid Other | Admitting: Internal Medicine

## 2013-12-31 ENCOUNTER — Encounter: Payer: Self-pay | Admitting: Internal Medicine

## 2013-12-31 VITALS — BP 122/82 | HR 74 | Ht 75.0 in | Wt 300.0 lb

## 2013-12-31 DIAGNOSIS — I5022 Chronic systolic (congestive) heart failure: Secondary | ICD-10-CM

## 2013-12-31 DIAGNOSIS — I2510 Atherosclerotic heart disease of native coronary artery without angina pectoris: Secondary | ICD-10-CM

## 2013-12-31 DIAGNOSIS — I4891 Unspecified atrial fibrillation: Secondary | ICD-10-CM

## 2013-12-31 DIAGNOSIS — I480 Paroxysmal atrial fibrillation: Secondary | ICD-10-CM

## 2013-12-31 MED ORDER — AMIODARONE HCL 200 MG PO TABS: 400.0000 mg | ORAL_TABLET | Freq: Two times a day (BID) | ORAL | Status: DC

## 2013-12-31 MED ORDER — RIVAROXABAN 20 MG PO TABS: 20.0000 mg | ORAL_TABLET | Freq: Every day | ORAL | Status: DC

## 2013-12-31 NOTE — Assessment & Plan Note (Signed)
The patient has maintained atrial fibrillation for the last several days. He was initially placed on intravenous amiodarone, but left the hospital against medical device. We will start the patient on oral amiodarone. He will be scheduled for TEE guided cardioversion after being on systemic anticoagulation for several days.

## 2013-12-31 NOTE — Progress Notes (Signed)
HPI Cody Ortega returns today for followup. He is a 53 year old man with an ischemic cardiomyopathy, chronic systolic heart failure, left ventricular dysfunction, status post ICD implantation. He was admitted to the hospital with atrial fibrillation and worsening heart failure. He underwent catheterization, demonstrating branch vessel disease but no major obstructive lesion. He was placed on amiodarone. He was scheduled to undergo TEE guided DC cardioversion but decided to sign out [place]. He did not get medications. He has not been on any additional blood thinners. The patient denies anginal symptoms. He has class III dyspnea. Previously his dyspnea was class II. He has mild peripheral edema. He denies dietary indiscretion. He does not know when he went into atrial fibrillation. Allergies  Allergen Reactions  . Codeine Itching and Nausea And Vomiting  . Hydrocodone-Acetaminophen Itching and Nausea And Vomiting     Current Outpatient Prescriptions  Medication Sig Dispense Refill  . albuterol (PROVENTIL) (2.5 MG/3ML) 0.083% nebulizer solution Take 2.5 mg by nebulization every 6 (six) hours as needed for wheezing or shortness of breath.      Marland Kitchen albuterol (VENTOLIN HFA) 108 (90 BASE) MCG/ACT inhaler 2 puffs as needed for shortness of breath      . alprazolam (XANAX) 2 MG tablet Take one tablet by mouth four times a day      . amLODipine (NORVASC) 10 MG tablet Take 10 mg by mouth daily with supper.       Marland Kitchen aspirin 81 MG EC tablet Take 81 mg by mouth daily.        . carvedilol (COREG) 25 MG tablet Take 25 mg by mouth 2 (two) times daily with a meal.      . furosemide (LASIX) 40 MG tablet Take 40 mg by mouth 2 (two) times daily as needed. As needed for weight over 300 lbs      . hydrALAZINE (APRESOLINE) 25 MG tablet Take 12.5 mg by mouth 3 (three) times daily.       Marland Kitchen ibuprofen (ADVIL,MOTRIN) 800 MG tablet Take 800 mg by mouth every 8 (eight) hours as needed for moderate pain.       . potassium chloride SA (K-DUR,KLOR-CON) 20 MEQ tablet Take 20 mEq by mouth daily.      . pravastatin (PRAVACHOL) 80 MG tablet Take 80 mg by mouth at bedtime.      . sildenafil (VIAGRA) 100 MG tablet Take 100 mg by mouth daily as needed for erectile dysfunction.      . ferrous sulfate 325 (65 FE) MG tablet Take 325 mg by mouth 2 (two) times daily with a meal.      . traZODone (DESYREL) 50 MG tablet Take 50 mg by mouth at bedtime. For sleep       No current facility-administered medications for this visit.     Past Medical History  Diagnosis Date  . Chronic systolic congestive heart failure     Acute decompensation, LVEF less than 20%  . Hypertension   . History of cocaine abuse   . Bronchial asthma   . Benzodiazepine dependence   . Anxiety   . Depression   . Microcytic anemia   . CKD (chronic kidney disease), stage IV     Stage 3-4  . 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 PAF (paroxysmal atrial fibrillation)     a. Episode in 2008 with spontaneous conversion  . CAD (coronary artery disease)     a. Cath 03/2010: mod RCA stenosis,  severe diag stenosis, treated medically given lack of angina.  . Stroke 2004  . S/P implantation of automatic cardioverter/defibrillator (AICD)     a. Medtronic, implanted 2012.    ROS:   All systems reviewed and negative except as noted in the HPI.   Past Surgical History  Procedure Laterality Date  . Tonsillectomy    . Transthoracic echocardiogram  10/2006, 12/2009     Family History  Problem Relation Age of Onset  . Breast cancer Mother   . Multiple myeloma Mother   . Prostate cancer Father   . Diabetes Father   . Cerebral palsy Daughter      History   Social History  . Marital Status: Divorced    Spouse Name: N/A    Number of Children: N/A  . Years of Education: N/A   Occupational History  . Unemployed     basketball referee in the past   Social History Main Topics  . Smoking status: Former Research scientist (life sciences)  .  Smokeless tobacco: Not on file  . Alcohol Use: Yes     Comment: occ  . Drug Use: No     Comment: Reported history of cocaine abuse off and on  . Sexual Activity: Not on file   Other Topics Concern  . Not on file   Social History Narrative   Living with a son who is a 83 year old.  He is not     working, unemployed for 3 years.  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  Denied use of alcohol.  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           BP 122/82  Pulse 74  Ht $R'6\' 3"'cX$  (1.905 m)  Wt 300 lb (136.079 kg)  BMI 37.50 kg/m2  Physical Exam:  Obese appearing middle-aged man, NAD HEENT: Unremarkable Neck:  7 cm JVD, no thyromegally Back:  No CVA tenderness Lungs:  Clear except for basilar rales. HEART:  IRegular tachycardia  rhythm, no murmurs, no rubs, no clicks Abd:  soft, positive bowel sounds, no organomegally, no rebound, no guarding Ext:  2 plus pulses, trace peripheral edema, no cyanosis, no clubbing Skin:  No rashes no nodules Neuro:  CN II through XII intact, motor grossly intact  DEVICE  Normal device function.  See PaceArt for details. Underlying rhythm is atrial fibrillation Assess/Plan:

## 2013-12-31 NOTE — Patient Instructions (Addendum)
Your physician recommends that you schedule a follow-up appointment in: 6 weeks with DR. Lovena Le.  Your physician has recommended you make the following change in your medication:  1)Take 2 pills (400mg ) of Amiodarone 2 times daily.  2) Take 20mg  Xarelto daily with dinner.  Your physician has recommended that you have a Cardioversion (DCCV). Electrical Cardioversion uses a jolt of electricity to your heart either through paddles or wired patches attached to your chest. This is a controlled, usually prescheduled, procedure. Defibrillation is done under light anesthesia in the hospital, and you usually go home the day of the procedure. This is done to get your heart back into a normal rhythm. You are not awake for the procedure. Please see the instruction sheet given to you today.

## 2013-12-31 NOTE — Assessment & Plan Note (Signed)
Interrogation of his Medtronic ICD demonstrates normal function. He is in atrial fibrillation today. He has had no ICD therapies.

## 2013-12-31 NOTE — Assessment & Plan Note (Signed)
He denies anginal symptoms. Note branch vessel disease.

## 2013-12-31 NOTE — Assessment & Plan Note (Signed)
His heart failure is class III. It has been exacerbated by atrial fibrillation with a rapid ventricular response. We'll plan to have the patient undergo DC cardioversion with transesophageal echo guidance.

## 2014-01-01 ENCOUNTER — Encounter: Payer: Medicaid Other | Admitting: Internal Medicine

## 2014-01-01 ENCOUNTER — Encounter (HOSPITAL_COMMUNITY): Payer: Self-pay | Admitting: Pharmacy Technician

## 2014-01-01 LAB — BASIC METABOLIC PANEL
BUN: 21 mg/dL (ref 6–23)
CHLORIDE: 107 meq/L (ref 96–112)
CO2: 27 meq/L (ref 19–32)
Calcium: 9.1 mg/dL (ref 8.4–10.5)
Creatinine, Ser: 1.9 mg/dL — ABNORMAL HIGH (ref 0.4–1.5)
GFR: 38.93 mL/min — ABNORMAL LOW (ref >60.00)
GLUCOSE: 101 mg/dL — AB (ref 70–99)
POTASSIUM: 4.1 meq/L (ref 3.5–5.1)
SODIUM: 140 meq/L (ref 135–145)

## 2014-01-01 LAB — CBC WITH DIFFERENTIAL/PLATELET
BASOS ABS: 0 10*3/uL (ref 0.0–0.1)
BASOS PCT: 0.4 % (ref 0.0–3.0)
EOS ABS: 0.3 10*3/uL (ref 0.0–0.7)
Eosinophils Relative: 3.1 % (ref 0.0–5.0)
HEMATOCRIT: 46.8 % (ref 39.0–52.0)
HEMOGLOBIN: 15.7 g/dL (ref 13.0–17.0)
Lymphocytes Relative: 13.9 % (ref 12.0–46.0)
Lymphs Abs: 1.1 10*3/uL (ref 0.7–4.0)
MCHC: 33.6 g/dL (ref 30.0–36.0)
MCV: 91.3 fl (ref 78.0–100.0)
Monocytes Absolute: 0.8 10*3/uL (ref 0.1–1.0)
Monocytes Relative: 9.5 % (ref 3.0–12.0)
NEUTROS ABS: 6 10*3/uL (ref 1.4–7.7)
Neutrophils Relative %: 73.1 % (ref 43.0–77.0)
Platelets: 207 10*3/uL (ref 150.0–400.0)
RBC: 5.12 Mil/uL (ref 4.22–5.81)
RDW: 14.4 % (ref 11.5–15.5)
WBC: 8.2 10*3/uL (ref 4.0–10.5)

## 2014-01-01 LAB — MDC_IDC_ENUM_SESS_TYPE_INCLINIC
Brady Statistic RV Percent Paced: 0 %
Date Time Interrogation Session: 20150901204304
HIGH POWER IMPEDANCE MEASURED VALUE: 43 Ohm
HIGH POWER IMPEDANCE MEASURED VALUE: 53 Ohm
HighPow Impedance: 19 Ohm
HighPow Impedance: 342 Ohm
Lead Channel Impedance Value: 323 Ohm
Lead Channel Pacing Threshold Amplitude: 1.125 V
Lead Channel Pacing Threshold Pulse Width: 0.4 ms
Lead Channel Sensing Intrinsic Amplitude: 9.625 mV
Lead Channel Setting Sensing Sensitivity: 0.3 mV
MDC IDC MSMT BATTERY VOLTAGE: 3.1 V
MDC IDC SET LEADCHNL RV PACING AMPLITUDE: 2.5 V
MDC IDC SET LEADCHNL RV PACING PULSEWIDTH: 0.4 ms
MDC IDC SET ZONE DETECTION INTERVAL: 280 ms
MDC IDC SET ZONE DETECTION INTERVAL: 360 ms
Zone Setting Detection Interval: 450 ms

## 2014-01-03 NOTE — Discharge Summary (Addendum)
Physician Discharge Summary  Cody Ortega [id number] DOB: 1960/10/01 DOA: 12/25/2013  PCP: Barbette Merino, MD  Admit date: 12/25/2013 Discharge date: 12/30/2013  Recommendations for Outpatient Follow-up:  1. Please note that pt left AMA  Discharge Diagnoses:  Active Problems: Atrial fib with RVR   Discharge Condition: Unknown as he left AMA   Diet recommendation: Pt was on heart healthy diet   Brief narrative:  53 year old male with a history of systolic CHF, hypertension, cocaine abuse, CKD stage III, PAF, CAD, presented with one-day history of right foot pain. The patient stated that he slipped off a step on the stairs and injured his right foot. There was no mechanical fall or syncope. X-rays of the right foot were negative for any fracture or dislocations in the emergency department. During his time in the emergency department, the patient was noted to be in atrial fibrillation with a heart rate in the 140s. The patient was completely asymptomatic and was unaware of his tachycardia. He denied any fevers, chills, chest discomfort, shortness breath, dizziness, vomiting, diarrhea, syncope. Cardiology was consulted in the emergency department and they recommended admission. The patient's heart rate was noted to be up to 140s, but the patient remained hemodynamically stable. Orders were placed in the emergency department to start a diltiazem drip as well as a heparin drip. Urine drug screen was positive for cannabis and benzodiazepines. BMP showed serum creatinine 1.8. CBC was unremarkable.   Assessment and Plan:   Assessment/Plan:  Atrial fibrillation with rapid ventricular response  -Urine drug screen showed cannabis and benzodiazepines  -Diltiazem drip and heparin drip initially started but was transitioned to Xarelto  -Cardiology following, recommendations appreciated  -started on Amio drip but pt left AMA coronary artery disease  -2011 cardiac catheterization showed two-vessel  disease  -Continue aspirin  -he has remained asymptomatic  CKD stage III  -Baseline creatinine 1.3-1.6  -continued lasix as per home medical regimen  Transaminitis  -LFT's trending down  Chronic systolic CHF  -Appears compensated  -01/18/2013 echocardiogram shows EF [id number], grade 1 diastolic dysfunction  -Patient has ICD  -Lasix as per home medical regimen  Hypertension  -Continued carvedilol  Hypokalemia  -supplemented Hyperlipidemia  -Continued statin  depression and anxiety  -Continued home medications  Tobacco abuse  -pt was curiously adamant he does not smoke but is part of a "smoking panel" in which he tries cigarettes for compensation   Code Status: Full    IV Access:   Peripheral IV Procedures and diagnostic studies:   Dg Chest 2 View 12/25/2013 Cardiomegaly without acute disease.  [name] Complete Right 12/25/2013 There is no acute bony abnormality of the right foot. If there are strong clinical concerns of occult injury at the tarsometatarsal joint region, further evaluation with MRI or CT scanning would be useful. Medical Consultants:   Cardiology Other Consultants:   None Anti-Infectives:   None  Discharge Exam: Filed Vitals:   12/29/13 0652  BP: 112/89  Pulse: 62  Temp: 98.3 F (36.8 C)  Resp: 20   Filed Vitals:   12/28/13 1414 12/28/13 2052 12/29/13 0100 12/29/13 0652  BP: 115/90 107/82  112/89  Pulse: 82 95  62  Temp: 98 F (36.7 C) 99.3 F (37.4 C)  98.3 F (36.8 C)  TempSrc: Oral Oral  Oral  Resp: 20 20  20   Height:      Weight:    140 kg (308 lb 10.3 oz)  SpO2: 97% 98% 100% 96%  No PE done as pt left  AMA  Discharge Instructions     Medication List    ASK your doctor about these medications       alprazolam 2 MG tablet  Commonly known as:  XANAX  Take 2 mg by mouth 4 (four) times daily.     amLODipine 10 MG tablet  Commonly known as:  NORVASC  Take 10 mg by mouth daily with supper.     aspirin 81 MG EC tablet  Take 81 mg  by mouth daily.     carvedilol 25 MG tablet  Commonly known as:  COREG  Take 25 mg by mouth 2 (two) times daily with a meal.     ferrous sulfate 325 (65 FE) MG tablet  Take 325 mg by mouth 2 (two) times daily with a meal.     furosemide 40 MG tablet  Commonly known as:  LASIX  Take 40 mg by mouth 2 (two) times daily as needed. As needed for weight over 300 lbs     hydrALAZINE 25 MG tablet  Commonly known as:  APRESOLINE  Take 12.5 mg by mouth 3 (three) times daily.     ibuprofen 800 MG tablet  Commonly known as:  ADVIL,MOTRIN  Take 800 mg by mouth every 8 (eight) hours as needed for moderate pain.     potassium chloride SA 20 MEQ tablet  Commonly known as:  K-DUR,KLOR-CON  Take 40 mEq by mouth daily.     pravastatin 80 MG tablet  Commonly known as:  PRAVACHOL  Take 80 mg by mouth at bedtime.     sildenafil 100 MG tablet  Commonly known as:  VIAGRA  Take 50 mg by mouth daily as needed for erectile dysfunction.     traZODone 50 MG tablet  Commonly known as:  DESYREL  Take 50 mg by mouth at bedtime as needed for sleep.     albuterol (2.5 MG/3ML) 0.083% nebulizer solution  Commonly known as:  PROVENTIL  Take 2.5 mg by nebulization every 6 (six) hours as needed for wheezing or shortness of breath.     VENTOLIN HFA 108 (90 BASE) MCG/ACT inhaler  Generic drug:  albuterol  Inhale 2 puffs into the lungs every 6 (six) hours as needed for wheezing or shortness of breath.           Follow-up Information   Follow up with Cristopher Peru, MD. (Office will call you for your followup appointment. Call office if you have not heard back in 3 days.)    Specialty:  Cardiology   Contact information:   [id number] [place] [address] [phone]        The results of significant diagnostics from this hospitalization (including imaging, microbiology, ancillary and laboratory) are listed below for reference.     Microbiology: Recent Results (from the past  240 hour(s))  MRSA PCR SCREENING     Status: None   Collection Time    12/25/13 10:00 PM      Result Value Ref Range Status   MRSA by PCR NEGATIVE  NEGATIVE Final   Comment:            The GeneXpert MRSA Assay (FDA     approved for NASAL specimens     only), is one component of a     comprehensive MRSA colonization     surveillance program. It is not     intended to diagnose MRSA     infection nor to guide or  monitor treatment for     MRSA infections.     Labs: Basic Metabolic Panel:  Recent Labs Lab 12/28/13 0342 12/29/13 0519 12/31/13 1707  NA 140 139 140  K 4.6 4.4 4.1  CL 107 105 107  CO2 22 20 27   GLUCOSE 127* 91 101*  BUN 25* 24* 21  CREATININE 1.83* 1.73* 1.9*  CALCIUM 8.9 8.9 9.1   Liver Function Tests:  Recent Labs Lab 12/28/13 0342  AST 28  ALT 52  ALKPHOS 55  BILITOT 0.8  PROT 7.2  ALBUMIN 3.1*   No results found for this basename: LIPASE, AMYLASE,  in the last 168 hours No results found for this basename: AMMONIA,  in the last 168 hours CBC:  Recent Labs Lab 12/29/13 0519 12/31/13 1707  WBC 10.7* 8.2  NEUTROABS  --  6.0  HGB 14.4 15.7  HCT 44.0 46.8  MCV 91.3 91.3  PLT 154 207.0   Cardiac Enzymes: No results found for this basename: CKTOTAL, CKMB, CKMBINDEX, TROPONINI,  in the last 168 hours BNP: BNP (last 3 results) No results found for this basename: PROBNP,  in the last 8760 hours CBG: No results found for this basename: GLUCAP,  in the last 168 hours   SIGNED: Time coordinating discharge: Over 30 minutes  Faye Ramsay, MD  [facility] 01/03/2014, 7:15 PM Pager [phone]  If 7PM-7AM, please contact night-coverage [email or web address] Password [email or web address]

## 2014-01-07 ENCOUNTER — Encounter (HOSPITAL_COMMUNITY)
Admission: RE | Disposition: A | Discharge status: Home / Self Care | Payer: Self-pay | Source: Ambulatory Visit | Attending: Cardiology

## 2014-01-07 ENCOUNTER — Encounter (HOSPITAL_COMMUNITY): Payer: Medicaid Other | Admitting: Critical Care Medicine

## 2014-01-07 ENCOUNTER — Encounter: Payer: Self-pay | Admitting: Internal Medicine

## 2014-01-07 ENCOUNTER — Ambulatory Visit (HOSPITAL_COMMUNITY)
Admission: RE | Admit: 2014-01-07 | Discharge: 2014-01-07 | Disposition: A | Discharge status: Home / Self Care | Payer: Medicaid Other | Source: Ambulatory Visit | Attending: Cardiology | Admitting: Cardiology

## 2014-01-07 ENCOUNTER — Ambulatory Visit (HOSPITAL_COMMUNITY): Payer: Medicaid Other | Admitting: Critical Care Medicine

## 2014-01-07 ENCOUNTER — Encounter (HOSPITAL_COMMUNITY): Payer: Self-pay

## 2014-01-07 DIAGNOSIS — I509 Heart failure, unspecified: Secondary | ICD-10-CM | POA: Insufficient documentation

## 2014-01-07 DIAGNOSIS — I2589 Other forms of chronic ischemic heart disease: Secondary | ICD-10-CM | POA: Insufficient documentation

## 2014-01-07 DIAGNOSIS — F411 Generalized anxiety disorder: Secondary | ICD-10-CM | POA: Insufficient documentation

## 2014-01-07 DIAGNOSIS — I4891 Unspecified atrial fibrillation: Secondary | ICD-10-CM | POA: Insufficient documentation

## 2014-01-07 DIAGNOSIS — Z885 Allergy status to narcotic agent status: Secondary | ICD-10-CM | POA: Insufficient documentation

## 2014-01-07 DIAGNOSIS — J45909 Unspecified asthma, uncomplicated: Secondary | ICD-10-CM | POA: Diagnosis not present

## 2014-01-07 DIAGNOSIS — I2510 Atherosclerotic heart disease of native coronary artery without angina pectoris: Secondary | ICD-10-CM | POA: Insufficient documentation

## 2014-01-07 DIAGNOSIS — I5022 Chronic systolic (congestive) heart failure: Secondary | ICD-10-CM | POA: Diagnosis not present

## 2014-01-07 DIAGNOSIS — I059 Rheumatic mitral valve disease, unspecified: Secondary | ICD-10-CM

## 2014-01-07 DIAGNOSIS — F1410 Cocaine abuse, uncomplicated: Secondary | ICD-10-CM | POA: Insufficient documentation

## 2014-01-07 DIAGNOSIS — Z538 Procedure and treatment not carried out for other reasons: Secondary | ICD-10-CM | POA: Insufficient documentation

## 2014-01-07 DIAGNOSIS — I10 Essential (primary) hypertension: Secondary | ICD-10-CM | POA: Insufficient documentation

## 2014-01-07 DIAGNOSIS — F329 Major depressive disorder, single episode, unspecified: Secondary | ICD-10-CM | POA: Diagnosis not present

## 2014-01-07 DIAGNOSIS — F3289 Other specified depressive episodes: Secondary | ICD-10-CM | POA: Diagnosis not present

## 2014-01-07 DIAGNOSIS — Z87891 Personal history of nicotine dependence: Secondary | ICD-10-CM | POA: Insufficient documentation

## 2014-01-07 HISTORY — PX: TEE WITHOUT CARDIOVERSION: SHX5443

## 2014-01-07 HISTORY — PX: CARDIOVERSION: SHX1299

## 2014-01-07 SURGERY — ECHOCARDIOGRAM, TRANSESOPHAGEAL
Anesthesia: General

## 2014-01-07 MED ORDER — SODIUM CHLORIDE 0.9 % IV SOLN
INTRAVENOUS | Status: DC | PRN
  Administered 2014-01-07: 13:00:00 via INTRAVENOUS

## 2014-01-07 MED ORDER — PROPOFOL 10 MG/ML IV BOLUS
INTRAVENOUS | Status: DC | PRN
  Administered 2014-01-07: 200 mg via INTRAVENOUS
  Administered 2014-01-07: 80 mg via INTRAVENOUS

## 2014-01-07 MED ORDER — PHENYLEPHRINE HCL 10 MG/ML IJ SOLN
INTRAMUSCULAR | Status: DC | PRN
  Administered 2014-01-07: 200 ug via INTRAVENOUS
  Administered 2014-01-07: 120 ug via INTRAVENOUS
  Administered 2014-01-07: 80 ug via INTRAVENOUS
  Administered 2014-01-07: 120 ug via INTRAVENOUS

## 2014-01-07 MED ORDER — MIDAZOLAM HCL 5 MG/5ML IJ SOLN
INTRAMUSCULAR | Status: DC | PRN
  Administered 2014-01-07: 2 mg via INTRAVENOUS

## 2014-01-07 MED ORDER — ALBUTEROL SULFATE HFA 108 (90 BASE) MCG/ACT IN AERS
INHALATION_SPRAY | RESPIRATORY_TRACT | Status: DC | PRN
  Administered 2014-01-07: 2 via RESPIRATORY_TRACT

## 2014-01-07 MED ORDER — ONDANSETRON HCL 4 MG/2ML IJ SOLN
INTRAMUSCULAR | Status: DC | PRN
  Administered 2014-01-07: 4 mg via INTRAVENOUS

## 2014-01-07 MED ORDER — LIDOCAINE HCL (CARDIAC) 20 MG/ML IV SOLN
INTRAVENOUS | Status: DC | PRN
  Administered 2014-01-07: 40 mg via INTRAVENOUS

## 2014-01-07 MED ORDER — MIDAZOLAM HCL 2 MG/2ML IJ SOLN
INTRAMUSCULAR | Status: AC
  Filled 2014-01-07: qty 2

## 2014-01-07 MED ORDER — SUCCINYLCHOLINE CHLORIDE 20 MG/ML IJ SOLN
INTRAMUSCULAR | Status: DC | PRN
  Administered 2014-01-07: 140 mg via INTRAVENOUS

## 2014-01-07 NOTE — Anesthesia Postprocedure Evaluation (Signed)
  Anesthesia Post-op Note  Patient: Cody Ortega  Procedure(s) Performed: Procedure(s): TRANSESOPHAGEAL ECHOCARDIOGRAM (TEE) (N/A) CARDIOVERSION (N/A)  Patient Location: PACU  Anesthesia Type:General  Level of Consciousness: awake, alert  and oriented  Airway and Oxygen Therapy: Patient Spontanous Breathing and Patient connected to nasal cannula oxygen  Post-op Pain: mild  Post-op Assessment: Post-op Vital signs reviewed, Patient's Cardiovascular Status Stable, Respiratory Function Stable, Patent Airway and Pain level controlled  Post-op Vital Signs: stable  Last Vitals:  Filed Vitals:   01/07/14 1450  BP: 120/68  Pulse: 129  Temp:   Resp: 14    Complications: No apparent anesthesia complications

## 2014-01-07 NOTE — Anesthesia Preprocedure Evaluation (Addendum)
Anesthesia Evaluation  Patient identified by MRN, date of birth, ID band Patient awake    Reviewed: Allergy & Precautions, H&P , NPO status , Patient's Chart, lab work & pertinent test results, reviewed documented beta blocker date and time   Airway Mallampati: III TM Distance: >3 FB Neck ROM: Full    Dental  (+) Dental Advisory Given, Teeth Intact   Pulmonary asthma , former smoker,  breath sounds clear to auscultation        Cardiovascular hypertension, Pt. on medications and Pt. on home beta blockers + CAD and +CHF + dysrhythmias + Cardiac Defibrillator Rhythm:Irregular Rate:Normal     Neuro/Psych Anxiety Depression CVA    GI/Hepatic (+)     substance abuse (benzo dependence, hx cocaine abuse)  ,   Endo/Other  Morbid obesity  Renal/GU Renal InsufficiencyRenal disease     Musculoskeletal   Abdominal (+) + obese,   Peds  Hematology   Anesthesia Other Findings   Reproductive/Obstetrics                         Anesthesia Physical Anesthesia Plan  ASA: III  Anesthesia Plan: General   Post-op Pain Management:    Induction: Intravenous  Airway Management Planned: Mask  Additional Equipment:   Intra-op Plan:   Post-operative Plan:   Informed Consent: I have reviewed the patients History and Physical, chart, labs and discussed the procedure including the risks, benefits and alternatives for the proposed anesthesia with the patient or authorized representative who has indicated his/her understanding and acceptance.   Dental advisory given  Plan Discussed with: Anesthesiologist, Surgeon and CRNA  Anesthesia Plan Comments:        Anesthesia Quick Evaluation

## 2014-01-07 NOTE — Discharge Instructions (Signed)
Transesophageal Echocardiogram °Transesophageal echocardiography (TEE) is a picture test of your heart using sound waves. The pictures taken can give very detailed pictures of your heart. This can help your doctor see if there are problems with your heart. TEE can check: °· If your heart has blood clots in it. °· How well your heart valves are working. °· If you have an infection on the inside of your heart. °· Some of the major arteries of your heart. °· If your heart valve is working after a repair. °· Your heart before a procedure that uses a shock to your heart to get the rhythm back to normal. °BEFORE THE PROCEDURE °· Do not eat or drink for 6 hours before the procedure or as told by your doctor. °· Make plans to have someone drive you home after the procedure. Do not drive yourself home. °· An IV tube will be put in your arm. °PROCEDURE °· You will be given a medicine to help you relax (sedative). It will be given through the IV tube. °· A numbing medicine will be sprayed or gargled in the back of your throat to help numb it. °· The tip of the probe is placed into the back of your mouth. You will be asked to swallow. This helps to pass the probe into your esophagus. °· Once the tip of the probe is in the right place, your doctor can take pictures of your heart. °· You may feel pressure at the back of your throat. °AFTER THE PROCEDURE °· You will be taken to a recovery area so the sedative can wear off. °· Your throat may be sore and scratchy. This will go away slowly over time. °· You will go home when you are fully awake and able to swallow liquids. °· You should have someone stay with you for the next 24 hours. °· Do not drive or operate machinery for the next 24 hours. °Document Released: 02/13/2009 Document Revised: 04/23/2013 Document Reviewed: 10/18/2012 °Exit[employer]® Patient Information ©2015 [employer]. This information is not intended to replace advice given to you by your health care provider. Make  sure you discuss any questions you have with your health care provider. ° °

## 2014-01-07 NOTE — CV Procedure (Signed)
See full TEE report in [employer]; patient intubated and sedated by anesthesia for the procedure; severe, global LV dysfunction (EF 10); positive LAA thrombus identified; DCCV cancelled; continue xarelto.  Kirk Ruths

## 2014-01-07 NOTE — H&P (Signed)
[facility]  12/31/2013 4:15 PM   Office Visit  MRN:  [id number]   Description: 53 year old male  Provider: Evans Lance, MD  Department: [address] [facility]          Vital Signs Most recent update: 12/31/2013  4:30 PM by Jordan Likes, CMA      BP Pulse Ht Wt BMI      122/82 74 $RemoveB'6\' 3"'mEoCmopq$  (1.905 m) 300 lb (136.079 kg) 37.50 kg/m2        Vitals History Recorded                Progress Notes      Evans Lance, MD at 12/31/2013  4:49 PM      Status: Signed                     HPI [name] returns today for followup. He is a 53 year old man with an ischemic cardiomyopathy, chronic systolic heart failure, left ventricular dysfunction, status post ICD implantation. He was admitted to the hospital with atrial fibrillation and worsening heart failure. He underwent catheterization, demonstrating branch vessel disease but no major obstructive lesion. He was placed on amiodarone. He was scheduled to undergo TEE guided DC cardioversion but decided to sign out [place]. He did not get medications. He has not been on any additional blood thinners. The patient denies anginal symptoms. He has class III dyspnea. Previously his dyspnea was class II. He has mild peripheral edema. He denies dietary indiscretion. He does not know when he went into atrial fibrillation. Allergies   Allergen  Reactions   .  Codeine  Itching and Nausea And Vomiting   .  Hydrocodone-Acetaminophen  Itching and Nausea And Vomiting           Current Outpatient Prescriptions   Medication  Sig  Dispense  Refill   .  albuterol (PROVENTIL) (2.5 MG/3ML) 0.083% nebulizer solution  Take 2.5 mg by nebulization every 6 (six) hours as needed for wheezing or shortness of breath.         Marland Kitchen  albuterol (VENTOLIN HFA) 108 (90 BASE) MCG/ACT inhaler  2 puffs as needed for shortness of breath         .  alprazolam (XANAX) 2 MG tablet  Take one tablet by mouth four times a day         .  amLODipine (NORVASC) 10  MG tablet  Take 10 mg by mouth daily with supper.          Marland Kitchen  aspirin 81 MG EC tablet  Take 81 mg by mouth daily.           .  carvedilol (COREG) 25 MG tablet  Take 25 mg by mouth 2 (two) times daily with a meal.         .  furosemide (LASIX) 40 MG tablet  Take 40 mg by mouth 2 (two) times daily as needed. As needed for weight over 300 lbs         .  hydrALAZINE (APRESOLINE) 25 MG tablet  Take 12.5 mg by mouth 3 (three) times daily.          Marland Kitchen  ibuprofen (ADVIL,MOTRIN) 800 MG tablet  Take 800 mg by mouth every 8 (eight) hours as needed for moderate pain.         .  potassium chloride SA (K-DUR,KLOR-CON) 20 MEQ tablet  Take 20 mEq by mouth daily.         Marland Kitchen  pravastatin (PRAVACHOL) 80 MG tablet  Take 80 mg by mouth at bedtime.         .  sildenafil (VIAGRA) 100 MG tablet  Take 100 mg by mouth daily as needed for erectile dysfunction.         .  ferrous sulfate 325 (65 FE) MG tablet  Take 325 mg by mouth 2 (two) times daily with a meal.         .  traZODone (DESYREL) 50 MG tablet  Take 50 mg by mouth at bedtime. For sleep             No current facility-administered medications for this visit.           Past Medical History   Diagnosis  Date   .  Chronic systolic congestive heart failure         Acute decompensation, LVEF less than 20%   .  Hypertension     .  History of cocaine abuse     .  Bronchial asthma     .  Benzodiazepine dependence     .  Anxiety     .  Depression     .  Microcytic anemia     .  CKD (chronic kidney disease), stage IV         Stage 3-4   .  Cardiomyopathy         possible cocaine induced   .  PAF (paroxysmal atrial fibrillation)         a. Episode in 2008 with spontaneous conversion   .  CAD (coronary artery disease)         a. Cath 03/2010: mod RCA stenosis, severe diag stenosis, treated medically given lack of angina.   .  Stroke  2004   .  S/P implantation of automatic cardioverter/defibrillator (AICD)         a. Medtronic, implanted 2012.         ROS:    All systems reviewed and negative except as noted in the HPI.      Past Surgical History   Procedure  Laterality  Date   .  Tonsillectomy       .  Transthoracic echocardiogram    10/2006, 12/2009           Family History   Problem  Relation  Age of Onset   .  Breast cancer  Mother     .  Multiple myeloma  Mother     .  Prostate cancer  Father     .  Diabetes  Father     .  Cerebral palsy  Daughter             History       Social History   .  Marital Status:  Divorced       Spouse Name:  N/A       Number of Children:  N/A   .  Years of Education:  N/A       Occupational History   .  Unemployed         basketball referee in the past       Social History Main Topics   .  Smoking status:  Former Research scientist (life sciences)   .  Smokeless tobacco:  Not on file   .  Alcohol Use:  Yes         Comment: occ   .  Drug Use:  No  Comment: Reported history of cocaine abuse off and on   .  Sexual Activity:  Not on file       Other Topics  Concern   .  Not on file       Social History Narrative     Living with a son who is a 31 year old.  He is not       working, unemployed for 3 years.  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  Denied use of alcohol.  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                  BP 122/82  Pulse 74  Ht $R'6\' 3"'OQ$  (1.905 m)  Wt 300 lb (136.079 kg)  BMI 37.50 kg/m2   Physical Exam:   Obese appearing middle-aged man, NAD HEENT: Unremarkable Neck:  7 cm JVD, no thyromegally Back:  No CVA tenderness Lungs:  Clear except for basilar rales. HEART:  IRegular tachycardia  rhythm, no murmurs, no rubs, no clicks Abd:  soft, positive bowel sounds, no organomegally, no rebound, no guarding Ext:  2 plus pulses, trace peripheral edema, no cyanosis, no clubbing Skin:  No rashes no nodules Neuro:  CN II through XII intact, motor grossly intact   DEVICE   Normal device function.  See PaceArt  for details. Underlying rhythm is atrial fibrillation Assess/Plan:         Automatic implantable cardioverter-defibrillator in situ - Evans Lance, MD at 12/31/2013  4:53 PM      Status: Written Related Problem: Automatic implantable cardioverter-defibrillator in situ    Interrogation of his Medtronic ICD demonstrates normal function. He is in atrial fibrillation today. He has had no ICD therapies.         Chronic systolic heart failure - Evans Lance, MD at 12/31/2013  4:54 PM      Status: Written Related Problem: Chronic systolic heart failure    His heart failure is class III. It has been exacerbated by atrial fibrillation with a rapid ventricular response. We'll plan to have the patient undergo DC cardioversion with transesophageal echo guidance.         CAD, NATIVE VESSEL - Evans Lance, MD at 12/31/2013  4:54 PM      Status: Written Related Problem: CAD, NATIVE VESSEL    He denies anginal symptoms. Note branch vessel disease.         Atrial fibrillation - Evans Lance, MD at 12/31/2013  4:55 PM      Status: Written Related Problem: Atrial fibrillation    The patient has maintained atrial fibrillation for the last several days. He was initially placed on intravenous amiodarone, but left the hospital against medical device. We will start the patient on oral amiodarone. He will be scheduled for TEE guided cardioversion after being on systemic anticoagulation for several days.    For TEE/DCCV No changes Kirk Ruths

## 2014-01-07 NOTE — Transfer of Care (Signed)
Immediate Anesthesia Transfer of Care Note  Patient: Cody Ortega  Procedure(s) Performed: Procedure(s): TRANSESOPHAGEAL ECHOCARDIOGRAM (TEE) (N/A) CARDIOVERSION (N/A)  Patient Location: Endoscopy Unit  Anesthesia Type:General  Level of Consciousness: awake and alert   Airway & Oxygen Therapy: Patient Spontanous Breathing and Patient connected to nasal cannula oxygen  Post-op Assessment: Report given to PACU RN, Post -op Vital signs reviewed and stable and Patient moving all extremities X 4  Post vital signs: Reviewed and stable  Complications: No apparent anesthesia complications

## 2014-01-07 NOTE — Anesthesia Procedure Notes (Signed)
Procedure Name: Intubation Date/Time: 01/07/2014 1:37 PM Performed by: Carola Frost Pre-anesthesia Checklist: Patient identified, Emergency Drugs available, Suction available, Patient being monitored and Timeout performed Patient Re-evaluated:Patient Re-evaluated prior to inductionOxygen Delivery Method: Circle system utilized Preoxygenation: Pre-oxygenation with 100% oxygen Intubation Type: IV induction Ventilation: Mask ventilation with difficulty and Oral airway inserted - appropriate to patient size Laryngoscope Size: Mac and 4 Grade View: Grade I Tube size: 7.5 mm Number of attempts: 1 Airway Equipment and Method: Stylet Placement Confirmation: CO2 detector,  positive ETCO2,  ETT inserted through vocal cords under direct vision and breath sounds checked- equal and bilateral Secured at: 24 cm Tube secured with: Tape Dental Injury: Teeth and Oropharynx as per pre-operative assessment

## 2014-01-07 NOTE — Progress Notes (Signed)
Echocardiogram Echocardiogram Transesophageal has been performed.  Cody Ortega 01/07/2014, 3:30 PM

## 2014-01-08 ENCOUNTER — Encounter (HOSPITAL_COMMUNITY): Payer: Self-pay | Admitting: Cardiology

## 2014-01-09 ENCOUNTER — Other Ambulatory Visit (INDEPENDENT_AMBULATORY_CARE_PROVIDER_SITE_OTHER): Payer: Medicaid Other

## 2014-01-09 DIAGNOSIS — E7800 Pure hypercholesterolemia, unspecified: Secondary | ICD-10-CM

## 2014-01-09 DIAGNOSIS — I5022 Chronic systolic (congestive) heart failure: Secondary | ICD-10-CM

## 2014-01-09 DIAGNOSIS — I10 Essential (primary) hypertension: Secondary | ICD-10-CM

## 2014-01-09 DIAGNOSIS — I2510 Atherosclerotic heart disease of native coronary artery without angina pectoris: Secondary | ICD-10-CM

## 2014-01-09 LAB — HEPATIC FUNCTION PANEL
ALK PHOS: 53 U/L (ref 39–117)
ALT: 58 U/L — ABNORMAL HIGH (ref 0–53)
AST: 41 U/L — ABNORMAL HIGH (ref 0–37)
Albumin: 3.6 g/dL (ref 3.5–5.2)
BILIRUBIN DIRECT: 0.1 mg/dL (ref 0.0–0.3)
Total Bilirubin: 0.6 mg/dL (ref 0.2–1.2)
Total Protein: 7.1 g/dL (ref 6.0–8.3)

## 2014-01-09 LAB — BASIC METABOLIC PANEL
BUN: 22 mg/dL (ref 6–23)
CO2: 24 mEq/L (ref 19–32)
CREATININE: 1.8 mg/dL — AB (ref 0.4–1.5)
Calcium: 9.1 mg/dL (ref 8.4–10.5)
Chloride: 105 mEq/L (ref 96–112)
GFR: 42.99 mL/min — AB (ref >60.00)
Glucose, Bld: 90 mg/dL (ref 70–99)
Potassium: 3.7 mEq/L (ref 3.5–5.1)
Sodium: 138 mEq/L (ref 135–145)

## 2014-01-09 LAB — LIPID PANEL
CHOL/HDL RATIO: 4
Cholesterol: 101 mg/dL (ref 0–200)
HDL: 27.4 mg/dL — AB (ref >39.00)
LDL CALC: 57 mg/dL (ref 0–99)
NonHDL: 73.6
TRIGLYCERIDES: 81 mg/dL (ref 0.0–149.0)
VLDL: 16.2 mg/dL (ref 0.0–40.0)

## 2014-01-10 ENCOUNTER — Encounter: Payer: Self-pay | Admitting: Cardiovascular Disease

## 2014-01-10 ENCOUNTER — Ambulatory Visit (INDEPENDENT_AMBULATORY_CARE_PROVIDER_SITE_OTHER): Payer: Medicaid Other | Admitting: Cardiovascular Disease

## 2014-01-10 VITALS — BP 114/82 | HR 121 | Ht 75.0 in | Wt 307.8 lb

## 2014-01-10 DIAGNOSIS — I4891 Unspecified atrial fibrillation: Secondary | ICD-10-CM

## 2014-01-10 DIAGNOSIS — I4819 Other persistent atrial fibrillation: Secondary | ICD-10-CM

## 2014-01-10 DIAGNOSIS — I10 Essential (primary) hypertension: Secondary | ICD-10-CM

## 2014-01-10 NOTE — Patient Instructions (Signed)
Your physician recommends that you continue on your current medications as directed. Please refer to the Current Medication list given to you today.  Your physician recommends that you schedule a follow-up appointment in: 2 months with Dr Burt Knack

## 2014-01-13 ENCOUNTER — Encounter: Payer: Self-pay | Admitting: Cardiovascular Disease

## 2014-01-13 NOTE — Progress Notes (Signed)
HPI:  53 year old gentleman with mixed ischemic and nonischemic cardiomyopathy, chronic systolic heart failure, chronic kidney disease, and recently diagnosed atrial fibrillation with RVR. He was hospitalized last month with atrial fibrillation with RVR. He was treated with IV diltiazem and amiodarone. The patient was scheduled for a TEE guided cardioversion but he signed out of the hospital against medical device. He was transitioned to oral amiodarone and was evaluated in the office by Dr. Lovena Le on September 1. He was then set up for a TEE cardioversion, but left atrial appendage thrombus was identified in the cardioversion was canceled. It was recommended that the patient continue on Xarelto.  The patient presents today for followup evaluation. He reports no change in symptoms. He complains of fatigue and exertional dyspnea. He has no resting chest pain or shortness of breath. He denies edema, orthopnea, or PND. He does not feel palpitations at present. He has been compliant with his medications over the last few days. He denies lightheadedness or presyncope.  Outpatient Encounter Prescriptions as of 01/10/2014  Medication Sig  . albuterol (PROVENTIL) (2.5 MG/3ML) 0.083% nebulizer solution Take 2.5 mg by nebulization every 6 (six) hours as needed for wheezing or shortness of breath.  Marland Kitchen albuterol (VENTOLIN HFA) 108 (90 BASE) MCG/ACT inhaler Inhale 2 puffs into the lungs every 6 (six) hours as needed for wheezing or shortness of breath.   . alprazolam (XANAX) 2 MG tablet Take 2 mg by mouth 4 (four) times daily.   Marland Kitchen amiodarone (PACERONE) 200 MG tablet Take 2 tablets (400 mg total) by mouth 2 (two) times daily.  Marland Kitchen amLODipine (NORVASC) 10 MG tablet Take 10 mg by mouth daily with supper.   Marland Kitchen aspirin 81 MG EC tablet Take 81 mg by mouth daily.    . carvedilol (COREG) 25 MG tablet Take 25 mg by mouth 2 (two) times daily with a meal.  . cycloSPORINE (RESTASIS) 0.05 % ophthalmic emulsion Place 1 drop into  both eyes 2 (two) times daily.  . ferrous sulfate 325 (65 FE) MG tablet Take 325 mg by mouth 2 (two) times daily with a meal.  . furosemide (LASIX) 40 MG tablet Take 40 mg by mouth 2 (two) times daily as needed. As needed for weight over 300 lbs  . hydrALAZINE (APRESOLINE) 25 MG tablet Take 12.5 mg by mouth 3 (three) times daily.   Marland Kitchen ibuprofen (ADVIL,MOTRIN) 800 MG tablet Take 800 mg by mouth every 8 (eight) hours as needed for moderate pain.  . potassium chloride SA (K-DUR,KLOR-CON) 20 MEQ tablet Take 40 mEq by mouth daily.   . pravastatin (PRAVACHOL) 80 MG tablet Take 80 mg by mouth at bedtime.  . rivaroxaban (XARELTO) 20 MG TABS tablet Take 1 tablet (20 mg total) by mouth daily with supper.  . sildenafil (VIAGRA) 100 MG tablet Take 50 mg by mouth daily as needed for erectile dysfunction.   . traZODone (DESYREL) 50 MG tablet Take 50 mg by mouth at bedtime as needed for sleep.     Allergies  Allergen Reactions  . Codeine Itching and Nausea And Vomiting  . Hydrocodone-Acetaminophen Itching and Nausea And Vomiting    Past Medical History  Diagnosis Date  . Chronic systolic congestive heart failure     Acute decompensation, LVEF less than 20%  . Hypertension   . History of cocaine abuse   . Bronchial asthma   . Benzodiazepine dependence   . Anxiety   . Depression   . Microcytic anemia   . CKD (chronic  kidney disease), stage IV     Stage 3-4  . 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 PAF (paroxysmal atrial fibrillation)     a. Episode in 2008 with spontaneous conversion  . CAD (coronary artery disease)     a. Cath 03/2010: mod RCA stenosis, severe diag stenosis, treated medically given lack of angina.  . Stroke 2004  . S/P implantation of automatic cardioverter/defibrillator (AICD)     a. Medtronic, implanted 2012.    ROS: Negative except as per HPI  BP 114/82  Pulse 121  Ht 6\' 3"  (1.905 m)  Wt 307 lb 12.8 oz (139.617 kg)  BMI 38.47 kg/m2  PHYSICAL EXAM: Pt is  alert and oriented, NAD HEENT: normal Neck: JVP - normal, carotids 2+= without bruits Lungs: CTA bilaterally CV: Tachycardic and irregular without murmur or gallop Abd: soft, NT, Positive BS, no hepatomegaly Ext: no C/C/E, distal pulses intact and equal Skin: warm/dry no rash  EKG:  Atrial fibrillation with RVR, heart rate 121 beats per minute, incomplete left bundle branch block.  TEE 01/07/2014: Study Conclusions  - Left ventricle: The estimated ejection fraction was 10%. Diffuse hypokinesis. There was a possible, apicalthrombus. - Aortic valve: No evidence of vegetation. - Mitral valve: No evidence of vegetation. There was moderate regurgitation. - Left atrium: The atrium was severely dilated. There was an apparent, medium-sizedthrombusin the appendage. There was moderate spontaneous echo contrast (&quot;smoke&quot;) in the cavity and the appendage. - Right ventricle: The cavity size was mildly dilated. Systolic function was moderately reduced. - Right atrium: The atrium was mildly dilated. - Atrial septum: No defect or patent foramen ovale was identified. - Tricuspid valve: No evidence of vegetation. There was moderate regurgitation. - Pulmonic valve: No evidence of vegetation. - Pericardium, extracardiac: A trivial pericardial effusion was identified.  Impressions:  - Severe global reduction in LV function; cannot R/O apical thrombus; severe LAE; LAA thrombus noted; mild RAE and RVE with moderately reduced RV function; moderate MR and TR.  ASSESSMENT AND PLAN: 1. Atrial fibrillation with RVR. Unfortunately I think we are doing all we can for his heart rhythm at present. He remains on amiodarone 400 mg twice daily as well as carvedilol 25 mg twice daily. Considering his LVEF of 10%, I am not inclined to start him on diltiazem. He remains anticoagulated on Xarelto. He sees Dr. Lovena Le back in followup on October 6. He will require DC cardioversion unless he converts on  amiodarone. Will defer whether he requires TEE guidance after 30 days of Xarelto to Dr. Lovena Le.  2. Malignant hypertension. Blood pressure is controlled on a combination of amlodipine, carvedilol, furosemide, hydralazine.  3. Coronary artery disease, native vessel. The patient is stable without anginal symptoms. He is on a beta blocker, low-dose aspirin, and statin drug.  4. Chronic systolic heart failure with severely reduced LVEF. He is at high risk of heart failure exacerbation in the setting of atrial fibrillation with RVR. For now he appears remarkably stable. He was advised to call if shortness of breath worsens at all.  [name] MD 01/13/2014 10:50 PM

## 2014-01-27 ENCOUNTER — Other Ambulatory Visit: Payer: Self-pay | Admitting: *Deleted

## 2014-01-27 MED ORDER — POTASSIUM CHLORIDE CRYS ER 20 MEQ PO TBCR: 40.0000 meq | EXTENDED_RELEASE_TABLET | Freq: Every day | ORAL | Status: DC

## 2014-02-04 ENCOUNTER — Ambulatory Visit (INDEPENDENT_AMBULATORY_CARE_PROVIDER_SITE_OTHER): Payer: Medicaid Other | Admitting: Internal Medicine

## 2014-02-04 ENCOUNTER — Encounter: Payer: Self-pay | Admitting: Internal Medicine

## 2014-02-04 ENCOUNTER — Encounter: Payer: Self-pay | Admitting: *Deleted

## 2014-02-04 VITALS — BP 148/88 | HR 77 | Ht 75.0 in | Wt 302.6 lb

## 2014-02-04 DIAGNOSIS — I480 Paroxysmal atrial fibrillation: Secondary | ICD-10-CM

## 2014-02-04 DIAGNOSIS — Z95810 Presence of automatic (implantable) cardiac defibrillator: Secondary | ICD-10-CM

## 2014-02-04 DIAGNOSIS — I5022 Chronic systolic (congestive) heart failure: Secondary | ICD-10-CM

## 2014-02-04 LAB — MDC_IDC_ENUM_SESS_TYPE_INCLINIC
Battery Voltage: 3.11 V
Brady Statistic RV Percent Paced: 0.01 %
Date Time Interrogation Session: 20151006163811
HIGH POWER IMPEDANCE MEASURED VALUE: 323 Ohm
HighPow Impedance: 19 Ohm
HighPow Impedance: 45 Ohm
HighPow Impedance: 58 Ohm
Lead Channel Pacing Threshold Amplitude: 1 V
Lead Channel Sensing Intrinsic Amplitude: 10.5 mV
Lead Channel Setting Pacing Pulse Width: 0.4 ms
Lead Channel Setting Sensing Sensitivity: 0.3 mV
MDC IDC MSMT LEADCHNL RV IMPEDANCE VALUE: 323 Ohm
MDC IDC MSMT LEADCHNL RV PACING THRESHOLD PULSEWIDTH: 0.4 ms
MDC IDC SET LEADCHNL RV PACING AMPLITUDE: 2.5 V
MDC IDC SET ZONE DETECTION INTERVAL: 450 ms
Zone Setting Detection Interval: 280 ms
Zone Setting Detection Interval: 360 ms

## 2014-02-04 NOTE — Assessment & Plan Note (Signed)
He is been therapeutically anticoagulated for over 4 weeks. He has been compliant by his report. He has been loaded with amiodarone. I've discussed the treatment options with the patient in detail, and I've recommended proceeding with cardioversion in the next week or so. He will continue his anticoagulation, as well as his high dose amiodarone. Once he was cardioverted, I would like him to reduce his amiodarone from 800 mg daily down to 200 mg daily.

## 2014-02-04 NOTE — Assessment & Plan Note (Signed)
His heart failure symptoms are class III. He will continue his current medical therapy. I'll expect his heart failure to improve with restoration of sinus rhythm.

## 2014-02-04 NOTE — Patient Instructions (Signed)
Your physician recommends that you return for lab work today (tsh, free t4, liver panel, BMP)  Your physician has recommended that you have a Cardioversion (DCCV). Electrical Cardioversion uses a jolt of electricity to your heart either through paddles or wired patches attached to your chest. This is a controlled, usually prescheduled, procedure. Defibrillation is done under light anesthesia in the hospital, and you usually go home the day of the procedure. This is done to get your heart back into a normal rhythm. You are not awake for the procedure. Please see the instruction sheet given to you today.

## 2014-02-04 NOTE — Progress Notes (Signed)
HPI Cody Ortega returns today for followup. He is a 53 year old man with a history of of ischemic as well as nonischemic cardiomyopathy, class II systolic heart failure now class III, recently diagnosed atrial fibrillation with a rapid ventricular response, and depression. He was hospitalized with atrial fibrillation several weeks ago and left the hospital Cody Ortega. He subsequently underwent transesophageal echo which demonstrated a left atrial appendage thrombus. He has been placed on systemic anticoagulation for the last 4 weeks. He denies medical noncompliance. He states specifically that he is taking his medicine daily. He admits to some depression. No suicidal ideations. Allergies  Allergen Reactions  . Codeine Itching and Nausea And Vomiting  . Hydrocodone-Acetaminophen Itching and Nausea And Vomiting     Current Outpatient Prescriptions  Medication Sig Dispense Refill  . albuterol (PROVENTIL) (2.5 MG/3ML) 0.083% nebulizer solution Take 2.5 mg by nebulization every 6 (six) hours as needed for wheezing or shortness of breath.      Marland Kitchen albuterol (VENTOLIN HFA) 108 (90 BASE) MCG/ACT inhaler Inhale 2 puffs into the lungs every 6 (six) hours as needed for wheezing or shortness of breath.       . alprazolam (XANAX) 2 MG tablet Take 2 mg by mouth 4 (four) times daily.       Marland Kitchen amiodarone (PACERONE) 200 MG tablet Take 2 tablets (400 mg total) by mouth 2 (two) times daily.  120 tablet  1  . amLODipine (NORVASC) 10 MG tablet Take 10 mg by mouth daily with supper.       Marland Kitchen aspirin 81 MG EC tablet Take 81 mg by mouth daily.        . carvedilol (COREG) 25 MG tablet Take 25 mg by mouth 2 (two) times daily with a meal.      . cycloSPORINE (RESTASIS) 0.05 % ophthalmic emulsion Place 1 drop into both eyes 2 (two) times daily.      . ferrous sulfate 325 (65 FE) MG tablet Take 325 mg by mouth 2 (two) times daily with a meal.      . furosemide (LASIX) 40 MG tablet Take 40 mg by mouth 2 (two)  times daily as needed. As needed for weight over 300 lbs      . hydrALAZINE (APRESOLINE) 25 MG tablet Take 12.5 mg by mouth 3 (three) times daily.       Marland Kitchen ibuprofen (ADVIL,MOTRIN) 800 MG tablet Take 800 mg by mouth every 8 (eight) hours as needed for moderate pain.      . potassium chloride SA (K-DUR,KLOR-CON) 20 MEQ tablet Take 2 tablets (40 mEq total) by mouth daily.  60 tablet  1  . pravastatin (PRAVACHOL) 80 MG tablet Take 80 mg by mouth at bedtime.      . rivaroxaban (XARELTO) 20 MG TABS tablet Take 1 tablet (20 mg total) by mouth daily with supper.  30 tablet  4  . sildenafil (VIAGRA) 100 MG tablet Take 50 mg by mouth daily as needed for erectile dysfunction.       . traZODone (DESYREL) 50 MG tablet Take 50 mg by mouth at bedtime as needed for sleep.        No current facility-administered medications for this visit.     Past Medical History  Diagnosis Date  . Chronic systolic congestive heart failure     Acute decompensation, LVEF less than 20%  . Hypertension   . History of cocaine abuse   . Bronchial asthma   . Benzodiazepine dependence   .  Anxiety   . Depression   . Microcytic anemia   . CKD (chronic kidney disease), stage IV     Stage 3-4  . 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 PAF (paroxysmal atrial fibrillation)     a. Episode in 2008 with spontaneous conversion  . CAD (coronary artery disease)     a. Cath 03/2010: mod RCA stenosis, severe diag stenosis, treated medically given lack of angina.  . Stroke 2004  . S/P implantation of automatic cardioverter/defibrillator (AICD)     a. Medtronic, implanted 2012.    ROS:   All systems reviewed and negative except as noted in the HPI.   Past Surgical History  Procedure Laterality Date  . Tonsillectomy    . Transthoracic echocardiogram  10/2006, 12/2009  . Tee without cardioversion N/A 01/07/2014    Procedure: TRANSESOPHAGEAL ECHOCARDIOGRAM (TEE);  Surgeon: Lelon Perla, MD;  Location: [facility] ENDOSCOPY;   Service: Cardiovascular;  Laterality: N/A;  . Cardioversion N/A 01/07/2014    Procedure: CARDIOVERSION;  Surgeon: Lelon Perla, MD;  Location: [facility] ENDOSCOPY;  Service: Cardiovascular;  Laterality: N/A;     Family History  Problem Relation Age of Onset  . Breast cancer Mother   . Multiple myeloma Mother   . Prostate cancer Father   . Diabetes Father   . Cerebral palsy Daughter      History   Social History  . Marital Status: Divorced    Spouse Name: N/A    Number of Children: N/A  . Years of Education: N/A   Occupational History  . Unemployed     basketball referee in the past   Social History Main Topics  . Smoking status: Former Research scientist (life sciences)  . Smokeless tobacco: Not on file  . Alcohol Use: Yes     Comment: occ  . Drug Use: No     Comment: Reported history of cocaine abuse off and on  . Sexual Activity: Not on file   Other Topics Concern  . Not on file   Social History Narrative   Living with a son who is a 69 year old.  He is not     working, unemployed for 3 years.  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  Denied use of alcohol.  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           BP 148/88  Pulse 77  Ht 6' 3" (1.905 m)  Wt 302 lb 9.6 oz (137.258 kg)  BMI 37.82 kg/m2  Physical Exam: Diskempt appearing middle-aged man, NAD HEENT: Unremarkable Neck:  No JVD, no thyromegally Back:  No CVA tenderness Lungs:  Clear except for basilar rales HEART:  IRegular rate rhythm, no murmurs, no rubs, no clicks Abd:  soft, positive bowel sounds, no organomegally, no rebound, no guarding Ext:  2 plus pulses, no edema, no cyanosis, no clubbing Skin:  No rashes no nodules Neuro:  CN II through XII intact, motor grossly intact  EKG - atrial fibrillation with a rapid ventricular response  DEVICE  Normal device function.  See PaceArt for details.   Assess/Plan:

## 2014-02-04 NOTE — Assessment & Plan Note (Signed)
His Medtronic ICD is working normally, and demonstrates an increase in his fluid index, a reduction in his resting heart rate, although it is still elevated, and no ventricular arrhythmias.

## 2014-02-05 ENCOUNTER — Encounter: Payer: Self-pay | Admitting: Internal Medicine

## 2014-02-05 LAB — HEPATIC FUNCTION PANEL
ALT: 45 U/L (ref 0–53)
AST: 36 U/L (ref 0–37)
Albumin: 3.5 g/dL (ref 3.5–5.2)
Alkaline Phosphatase: 74 U/L (ref 39–117)
BILIRUBIN DIRECT: 0.1 mg/dL (ref 0.0–0.3)
TOTAL PROTEIN: 7.3 g/dL (ref 6.0–8.3)
Total Bilirubin: 0.8 mg/dL (ref 0.2–1.2)

## 2014-02-05 LAB — BASIC METABOLIC PANEL
BUN: 27 mg/dL — AB (ref 6–23)
CHLORIDE: 106 meq/L (ref 96–112)
CO2: 25 mEq/L (ref 19–32)
Calcium: 9.3 mg/dL (ref 8.4–10.5)
Creatinine, Ser: 2.1 mg/dL — ABNORMAL HIGH (ref 0.4–1.5)
GFR: 35.13 mL/min — ABNORMAL LOW (ref >60.00)
Glucose, Bld: 96 mg/dL (ref 70–99)
POTASSIUM: 4.1 meq/L (ref 3.5–5.1)
Sodium: 140 mEq/L (ref 135–145)

## 2014-02-05 LAB — T4, FREE: Free T4: 0.96 ng/dL (ref 0.60–1.60)

## 2014-02-05 LAB — TSH: TSH: 2.62 u[IU]/mL (ref 0.35–4.50)

## 2014-02-06 ENCOUNTER — Telehealth: Payer: Self-pay | Admitting: *Deleted

## 2014-02-06 ENCOUNTER — Other Ambulatory Visit: Payer: Self-pay | Admitting: *Deleted

## 2014-02-06 NOTE — Telephone Encounter (Signed)
LMOM for patient with date and time for DCCV.  Be at the hospital at 9:30am on 02/11/14.  [employer], [place] after midnight.

## 2014-02-07 ENCOUNTER — Encounter (HOSPITAL_COMMUNITY): Payer: Self-pay | Admitting: Pharmacy Technician

## 2014-02-11 ENCOUNTER — Ambulatory Visit (HOSPITAL_COMMUNITY)
Admission: RE | Admit: 2014-02-11 | Discharge: 2014-02-11 | Disposition: A | Discharge status: Home / Self Care | Payer: Medicaid Other | Source: Ambulatory Visit | Attending: Internal Medicine | Admitting: Internal Medicine

## 2014-02-11 ENCOUNTER — Ambulatory Visit (HOSPITAL_COMMUNITY): Payer: Medicaid Other | Admitting: Anesthesiology

## 2014-02-11 ENCOUNTER — Encounter (HOSPITAL_COMMUNITY): Payer: Self-pay | Admitting: Certified Registered"

## 2014-02-11 ENCOUNTER — Encounter (HOSPITAL_COMMUNITY)
Admission: RE | Disposition: A | Discharge status: Home / Self Care | Payer: Self-pay | Source: Ambulatory Visit | Attending: Internal Medicine

## 2014-02-11 ENCOUNTER — Encounter (HOSPITAL_COMMUNITY): Payer: Medicaid Other | Admitting: Anesthesiology

## 2014-02-11 DIAGNOSIS — Z8673 Personal history of transient ischemic attack (TIA), and cerebral infarction without residual deficits: Secondary | ICD-10-CM | POA: Diagnosis not present

## 2014-02-11 DIAGNOSIS — I4819 Other persistent atrial fibrillation: Secondary | ICD-10-CM

## 2014-02-11 DIAGNOSIS — I2510 Atherosclerotic heart disease of native coronary artery without angina pectoris: Secondary | ICD-10-CM | POA: Diagnosis not present

## 2014-02-11 DIAGNOSIS — I341 Nonrheumatic mitral (valve) prolapse: Secondary | ICD-10-CM

## 2014-02-11 DIAGNOSIS — I5022 Chronic systolic (congestive) heart failure: Secondary | ICD-10-CM | POA: Insufficient documentation

## 2014-02-11 DIAGNOSIS — J45909 Unspecified asthma, uncomplicated: Secondary | ICD-10-CM | POA: Insufficient documentation

## 2014-02-11 DIAGNOSIS — Z79899 Other long term (current) drug therapy: Secondary | ICD-10-CM | POA: Diagnosis not present

## 2014-02-11 DIAGNOSIS — F329 Major depressive disorder, single episode, unspecified: Secondary | ICD-10-CM | POA: Insufficient documentation

## 2014-02-11 DIAGNOSIS — F419 Anxiety disorder, unspecified: Secondary | ICD-10-CM | POA: Diagnosis not present

## 2014-02-11 DIAGNOSIS — I480 Paroxysmal atrial fibrillation: Secondary | ICD-10-CM | POA: Diagnosis present

## 2014-02-11 DIAGNOSIS — Z885 Allergy status to narcotic agent status: Secondary | ICD-10-CM | POA: Insufficient documentation

## 2014-02-11 DIAGNOSIS — Z7982 Long term (current) use of aspirin: Secondary | ICD-10-CM | POA: Diagnosis not present

## 2014-02-11 DIAGNOSIS — I481 Persistent atrial fibrillation: Secondary | ICD-10-CM

## 2014-02-11 DIAGNOSIS — Z95810 Presence of automatic (implantable) cardiac defibrillator: Secondary | ICD-10-CM | POA: Diagnosis not present

## 2014-02-11 DIAGNOSIS — Z87891 Personal history of nicotine dependence: Secondary | ICD-10-CM | POA: Diagnosis not present

## 2014-02-11 DIAGNOSIS — R001 Bradycardia, unspecified: Secondary | ICD-10-CM

## 2014-02-11 HISTORY — PX: CARDIOVERSION: SHX1299

## 2014-02-11 HISTORY — PX: TEE WITHOUT CARDIOVERSION: SHX5443

## 2014-02-11 SURGERY — CARDIOVERSION
Anesthesia: Monitor Anesthesia Care

## 2014-02-11 MED ORDER — PROPOFOL INFUSION 10 MG/ML OPTIME
INTRAVENOUS | Status: DC | PRN
  Administered 2014-02-11: 160 ug/kg/min via INTRAVENOUS

## 2014-02-11 MED ORDER — AMIODARONE HCL 200 MG PO TABS: 200.0000 mg | ORAL_TABLET | Freq: Two times a day (BID) | ORAL | Status: DC

## 2014-02-11 MED ORDER — PROPOFOL 10 MG/ML IV BOLUS
INTRAVENOUS | Status: DC | PRN
  Administered 2014-02-11 (×3): 20 mg via INTRAVENOUS

## 2014-02-11 MED ORDER — BUTAMBEN-TETRACAINE-BENZOCAINE 2-2-14 % EX AERO
INHALATION_SPRAY | CUTANEOUS | Status: DC | PRN
  Administered 2014-02-11: 2 via TOPICAL

## 2014-02-11 MED ORDER — SODIUM CHLORIDE 0.9 % IV SOLN
INTRAVENOUS | Status: DC
  Administered 2014-02-11: 1000 mL via INTRAVENOUS

## 2014-02-11 MED ORDER — CARVEDILOL 25 MG PO TABS: 12.5000 mg | ORAL_TABLET | Freq: Two times a day (BID) | ORAL | Status: DC

## 2014-02-11 NOTE — CV Procedure (Signed)
TEE/CARDIOVERSION NOTE  TRANSESOPHAGEAL ECHOCARDIOGRAM (TEE):  Indictation: Atrial Fibrillation  Consent:   Informed consent was obtained prior to the procedure. The risks, benefits and alternatives for the procedure were discussed and the patient comprehended these risks.  Risks include, but are not limited to, cough, sore throat, vomiting, nausea, somnolence, esophageal and stomach trauma or perforation, bleeding, low blood pressure, aspiration, pneumonia, infection, trauma to the teeth and death.    Time Out: Verified patient identification, verified procedure, site/side was marked, verified correct patient position, special equipment/implants available, medications/allergies/relevent history reviewed, required imaging and test results available. Performed  Procedure:  After a procedural time-out, the patient was given propofol per anesthesia for sedation.  The oropharynx was anesthetized with cetacaine spray.  The transesophageal probe was inserted in the esophagus and stomach without difficulty and multiple views were obtained.  The patient was kept under observation until the patient left the procedure room.  The patient left the procedure room in stable condition.   Agitated microbubble saline contrast was not administered.  Complications:    Complications: None Patient did tolerate procedure well.  Findings:  1. LEFT VENTRICLE: The left ventricular wall thickness is mildly increased.  The left ventricular cavity is dilated in size. Wall motion is severely hypokinetic.  LVEF is 15-20%.  2. RIGHT VENTRICLE:  The right ventricle is normal in structure and function without any thrombus or masses. AICD wire is noted.   3. LEFT ATRIUM:  The left atrium is dilated in size without any thrombus or masses.  There is spontaneous echo contrast ("smoke") in the left atrium consistent with a low flow state.  4. LEFT ATRIAL APPENDAGE:  The left atrial appendage is free of any thrombus  or masses. The appendage has single lobes. Pulse doppler indicates low flow in the appendage.  5. ATRIAL SEPTUM:  The atrial septum appears intact and is free of thrombus and/or masses.  There is no evidence for interatrial shunting by color doppler and saline microbubble.  6. RIGHT ATRIUM:  The right atrium is normal in size and function without any thrombus or masses. AICD wire noted with small amount of mobile thrombus on the wire.  7. MITRAL VALVE:  The mitral valve is normal in structure and function with Mild regurgitation.  There were no vegetations or stenosis.  8. AORTIC VALVE:  The aortic valve is trileaflet, normal in structure and function with no regurgitation.  There were no vegetations or stenosis  9. TRICUSPID VALVE:  The tricuspid valve is normal in structure and function with Moderate regurgitation.  There were no vegetations or stenosis  10.  PULMONIC VALVE:  The pulmonic valve is normal in structure and function with trivial regurgitation.  There were no vegetations or stenosis.   11. AORTIC ARCH, ASCENDING AND DESCENDING AORTA:  The aorta was not well visualized.  12. PULMONARY VEINS: Anomalous pulmonary venous return was not noted.  13. PERICARDIUM: The pericardium appeared normal and non-thickened.  There is no pericardial effusion.  CARDIOVERSION:     Second Time Out: Verified patient identification, verified procedure, site/side was marked, verified correct patient position, special equipment/implants available, medications/allergies/relevent history reviewed, required imaging and test results available.  Performed  Procedure:  1. Patient placed on cardiac monitor, pulse oximetry, supplemental oxygen as necessary.  2. Sedation administered per anesthesia 3. Pacer pads placed anterior and posterior chest. 4. Cardioverted 2 time(s).  5. Cardioverted at 150J and 200J biphasic.  Complications:  Complications: None Patient did tolerate procedure  well.  Impression:  1. LAA thrombus has resolved, however, significant LA smoke is noted 2. Small amount of thrombus noted adherent to the AICD wire in the RA 3. Severe global hypokinesis EF 15-20% 4. Successful DCCV to sinus bradycardia.  Recommendations:  1. Continue anticoagulation long-term given recent thrombus and high CHADSVASC score. 2. Small amount of thrombus noted on the AICD wire - may resolve with additional anticoagulation. 3. Follow-up with Dr. Lovena Le.  Time Spent Directly with the Patient:  45 minutes   Pixie Casino, MD, [facility] Attending Cardiologist [facility]  02/11/2014, 12:08 PM

## 2014-02-11 NOTE — Progress Notes (Signed)
Patient seen by Dr Rayann Heman ok to go home, made some changes to coreg 25 mg 1/2 tab BID, Amiodarone 1 tablet BID x 1 week , then 1 tablet daily

## 2014-02-11 NOTE — Consult Note (Signed)
ELECTROPHYSIOLOGY CONSULT NOTE    Patient ID: Cody Ortega MRN: [id number], DOB/AGE: 1960-06-26 53 y.o.  Date of Consult: 02-11-14  Primary Physician: Barbette Merino, MD Primary Cardiologist: Burt Knack Electrophysiologist: Lovena Le  Reason for Consultation: bradycardia post cardioversion  HPI:  Cody Ortega is a 53 y.o. male with a history of of ischemic as well as nonischemic cardiomyopathy, class II systolic heart failure now class III, and recently diagnosed atrial fibrillation with a rapid ventricular response 12-2013. TEE demonstrated LAA thrombus.  He was anticoagulated and returned today for TEE/DCCV.  TEE was negative for thrombus.  He was cardioverted to sinus bradycardia with some relative hypotension.  Blood pressure has improved.  Heart rates are now in the upper 40s, lower 50s.    He denies chest pain, increased shortness of breath, dizziness, palpitations, recent fevers or chills.  ROS is otherwise negative.   He has a single chamber ICD programmed VVI 40.   He is currently taking amiodarone $RemoveBeforeDEI'200mg'xCpeSWWxfxxWVOrT$  2 tablets twice daily as well as coreg $Remove'25mg'MmfgIEb$  twice daily.  He took both medications this morning.   EP has been asked to evaluate for treatment options.    Past Medical History  Diagnosis Date  . Chronic systolic congestive heart failure     Acute decompensation, LVEF less than 20%  . Hypertension   . History of cocaine abuse   . Bronchial asthma   . Benzodiazepine dependence   . Anxiety   . Depression   . Microcytic anemia   . CKD (chronic kidney disease), stage IV     Stage 3-4  . 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 PAF (paroxysmal atrial fibrillation)     a. Episode in 2008 with spontaneous conversion  . CAD (coronary artery disease)     a. Cath 03/2010: mod RCA stenosis, severe diag stenosis, treated medically given lack of angina.  . Stroke 2004  . S/P implantation of automatic cardioverter/defibrillator (AICD)     a. Medtronic, implanted 2012.     Surgical History:  Past Surgical History  Procedure Laterality Date  . Tonsillectomy    . Transthoracic echocardiogram  10/2006, 12/2009  . Tee without cardioversion N/A 01/07/2014    Procedure: TRANSESOPHAGEAL ECHOCARDIOGRAM (TEE);  Surgeon: Lelon Perla, MD;  Location: [facility] ENDOSCOPY;  Service: Cardiovascular;  Laterality: N/A;  . Cardioversion N/A 01/07/2014    Procedure: CARDIOVERSION;  Surgeon: Lelon Perla, MD;  Location: [facility] ENDOSCOPY;  Service: Cardiovascular;  Laterality: N/A;     Prescriptions prior to admission  Medication Sig Dispense Refill  . albuterol (VENTOLIN HFA) 108 (90 BASE) MCG/ACT inhaler Inhale 2 puffs into the lungs every 6 (six) hours as needed for wheezing or shortness of breath.       . alprazolam (XANAX) 2 MG tablet Take 2 mg by mouth 4 (four) times daily.       Marland Kitchen amiodarone (PACERONE) 200 MG tablet Take 2 tablets (400 mg total) by mouth 2 (two) times daily.  120 tablet  1  . amLODipine (NORVASC) 10 MG tablet Take 10 mg by mouth daily with supper.       Marland Kitchen aspirin 81 MG EC tablet Take 81 mg by mouth daily.        . carvedilol (COREG) 25 MG tablet Take 25 mg by mouth 2 (two) times daily with a meal.      . cycloSPORINE (RESTASIS) 0.05 % ophthalmic emulsion Place 1 drop into both eyes 2 (two) times daily.      Marland Kitchen  ferrous sulfate 325 (65 FE) MG tablet Take 325 mg by mouth 2 (two) times daily with a meal.      . furosemide (LASIX) 40 MG tablet Take 40 mg by mouth 2 (two) times daily as needed. As needed for weight over 300 lbs      . hydrALAZINE (APRESOLINE) 25 MG tablet Take 12.5 mg by mouth 3 (three) times daily.       . potassium chloride SA (K-DUR,KLOR-CON) 20 MEQ tablet Take 2 tablets (40 mEq total) by mouth daily.  60 tablet  1  . pravastatin (PRAVACHOL) 80 MG tablet Take 80 mg by mouth at bedtime.      . rivaroxaban (XARELTO) 20 MG TABS tablet Take 1 tablet (20 mg total) by mouth daily with supper.  30 tablet  4  . sildenafil (VIAGRA) 100 MG tablet Take 50 mg  by mouth daily as needed for erectile dysfunction.       . traZODone (DESYREL) 50 MG tablet Take 50 mg by mouth at bedtime as needed for sleep.       Marland Kitchen albuterol (PROVENTIL) (2.5 MG/3ML) 0.083% nebulizer solution Take 2.5 mg by nebulization every 6 (six) hours as needed for wheezing or shortness of breath.      Marland Kitchen ibuprofen (ADVIL,MOTRIN) 800 MG tablet Take 800 mg by mouth every 8 (eight) hours as needed for moderate pain.        Inpatient Medications:    Allergies:  Allergies  Allergen Reactions  . Codeine Itching and Nausea And Vomiting  . Hydrocodone-Acetaminophen Itching and Nausea And Vomiting    History   Social History  . Marital Status: Divorced    Spouse Name: N/A    Number of Children: N/A  . Years of Education: N/A   Occupational History  . Unemployed     basketball referee in the past   Social History Main Topics  . Smoking status: Former Research scientist (life sciences)  . Smokeless tobacco: Not on file  . Alcohol Use: Yes     Comment: occ  . Drug Use: No     Comment: Reported history of cocaine abuse off and on  . Sexual Activity: Not on file   Other Topics Concern  . Not on file   Social History Narrative   Living with a son who is a 19 year old.  He is not     working, unemployed for 3 years.  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  Denied use of alcohol.  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           Family History  Problem Relation Age of Onset  . Breast cancer Mother   . Multiple myeloma Mother   . Prostate cancer Father   . Diabetes Father   . Cerebral palsy Daughter     BP 105/64  Pulse 43  Resp 11  Ht $R'6\' 3"'eo$  (1.905 m)  Wt 300 lb (136.079 kg)  BMI 37.50 kg/m2  SpO2 97%  Physical Exam: Filed Vitals:   02/11/14 1225 02/11/14 1230 02/11/14 1241 02/11/14 1250  BP: 1$Rem'05/63 96/61 94/65 'MSDH$ 105/64  Pulse: 43 45 44 43  TempSrc:      Resp: $Remo'16 16 14 11  'ZXyee$ Height:      Weight:      SpO2: 98% 98% 98% 97%    GEN- The patient is well  appearing, alert and oriented x 3 today.   Head- normocephalic, atraumatic Eyes-  Sclera  clear, conjunctiva pink Ears- hearing intact Oropharynx- clear Neck- supple, Lungs- Clear to ausculation bilaterally, normal work of breathing Heart- Regular rate and rhythm  GI- soft, NT, ND, + BS Extremities- no clubbing, cyanosis, or edema  Neuro- strength and sensation are intact    Labs:   Lab Results  Component Value Date   WBC 8.2 12/31/2013   HGB 15.7 12/31/2013   HCT 46.8 12/31/2013   MCV 91.3 12/31/2013   PLT 207.0 12/31/2013    Recent Labs Lab 02/04/14 1702  NA 140  K 4.1  CL 106  CO2 25  BUN 27*  CREATININE 2.1*  CALCIUM 9.3  PROT 7.3  BILITOT 0.8  ALKPHOS 74  ALT 45  AST 36  GLUCOSE 96      VGK:KDPTE brady, 1st degree AV block (PR 224)  TELEMETRY: sinus brady 40's  DEVICE HISTORY: MDT Protecta single chamber ICD implanted 12-06-2010 by Dr Lovena Le.  Interrogation today is normal  A/P  1. Persistent afib Maintaining sinus presently with amioadarone Would decrease to $RemoveBef'200mg'iMJagVEQJT$  BId x 1 week then $RemoveB'200mg'PFSyfDYG$  daily Decrease coreg to 12.$RemoveBef'5mg'hYihzsATVg$  BId Continue xareto without interruption  2. Asymptomatic bradycardia Decrease coreg to 12.$RemoveBef'5mg'hsWNzxDzDn$  BID Decrease amiodarone to $RemoveBefor'200mg'FlMaKNngxQZt$  BID x 1 week then $RemoveB'200mg'guKshbDH$  daily BP is now 119/60.  No indication for admission  3. Chronic systolic dysfunction Decrease coreg Normal ICD function No changes today  OK to discharge to home from an EP standpoint Will arrange follow-up with Roderic Palau in 1 week Follow-up with Dr Lovena Le as scheduled

## 2014-02-11 NOTE — Transfer of Care (Signed)
Immediate Anesthesia Transfer of Care Note  Patient: Cody Ortega  Procedure(s) Performed: Procedure(s): CARDIOVERSION (N/A) TRANSESOPHAGEAL ECHOCARDIOGRAM (TEE) (N/A)  Patient Location: Endoscopy Unit  Anesthesia Type:MAC  Level of Consciousness: awake  Airway & Oxygen Therapy: Patient Spontanous Breathing and Patient connected to nasal cannula oxygen  Post-op Assessment: Report given to PACU RN  Post vital signs: Reviewed and stable  Complications: No apparent anesthesia complications

## 2014-02-11 NOTE — Discharge Instructions (Signed)
Electrical Cardioversion Electrical cardioversion is the delivery of a jolt of electricity to change the rhythm of the heart. Sticky patches or metal paddles are placed on the chest to deliver the electricity from a device. This is done to restore a normal rhythm. A rhythm that is too fast or not regular keeps the heart from pumping well. Electrical cardioversion is done in an emergency if:   There is low or no blood pressure as a result of the heart rhythm.   Normal rhythm must be restored as fast as possible to protect the brain and heart from further damage.   It may save a life. Cardioversion may be done for heart rhythms that are not immediately life threatening, such as atrial fibrillation or flutter, in which:   The heart is beating too fast or is not regular.   Medicine to change the rhythm has not worked.   It is safe to wait in order to allow time for preparation.  Symptoms of the abnormal rhythm are bothersome.  The risk of stroke and other serious problems can be reduced. LET [facility] CARE PROVIDER KNOW ABOUT:   Any allergies you have.  All medicines you are taking, including vitamins, herbs, eye drops, creams, and over-the-counter medicines.  Previous problems you or members of your family have had with the use of anesthetics.   Any blood disorders you have.   Previous surgeries you have had.   Medical conditions you have. RISKS AND COMPLICATIONS  Generally, this is a safe procedure. However, problems can occur and include:   Breathing problems related to the anesthetic used.  A blood clot that breaks free and travels to other parts of your body. This could cause a stroke or other problems. The risk of this is lowered by use of blood-thinning medicine (anticoagulant) prior to the procedure.  Cardiac arrest (rare). BEFORE THE PROCEDURE   You may have tests to detect blood clots in your heart and to evaluate heart function.  You may start taking  anticoagulants so your blood does not clot as easily.   Medicines may be given to help stabilize your heart rate and rhythm. PROCEDURE  You will be given medicine through an IV tube to reduce discomfort and make you sleepy (sedative).   An electrical shock will be delivered. AFTER THE PROCEDURE Your heart rhythm will be watched to make sure it does not change.  Document Released: 04/08/2002 Document Revised: 09/02/2013 Document Reviewed: 10/31/2012 [facility] Patient Information 2015 [place], [place]. This information is not intended to replace advice given to you by your health care provider. Make sure you discuss any questions you have with your health care provider. Transesophageal Echocardiogram Transesophageal echocardiography (TEE) is a special type of test that produces images of the heart by using sound waves (echocardiogram). This type of echocardiography can obtain better images of the heart than standard echocardiography. TEE is done by passing a flexible tube down the esophagus. The heart is located in front of the esophagus. Because the heart and esophagus are close to one another, your health care provider can take very clear, detailed pictures of the heart via ultrasound waves. TEE may be done:  If your health care provider needs more information based on standard echocardiography findings.  If you had a stroke. This might have happened because a clot formed in your heart. TEE can visualize different areas of the heart and check for clots.  To check valve anatomy and function.  To check for infection on the  inside of your heart (endocarditis).  To evaluate the dividing wall (septum) of the heart and presence of a hole that did not close after birth (patent foramen ovale or atrial septal defect).  To help diagnose a tear in the wall of the aorta (aortic dissection).  During cardiac valve surgery. This allows the surgeon to assess the valve repair before closing the  chest.  During a variety of other cardiac procedures to guide positioning of catheters.  Sometimes before a cardioversion, which is a shock to convert heart rhythm back to normal. LET [facility] CARE PROVIDER KNOW ABOUT:   Any allergies you have.  All medicines you are taking, including vitamins, herbs, eye drops, creams, and over-the-counter medicines.  Previous problems you or members of your family have had with the use of anesthetics.  Any blood disorders you have.  Previous surgeries you have had.  Medical conditions you have.  Swallowing difficulties.  An esophageal obstruction. RISKS AND COMPLICATIONS  Generally, TEE is a safe procedure. However, as with any procedure, complications can occur. Possible complications include an esophageal tear (rupture). BEFORE THE PROCEDURE   Do not eat or drink for 6 hours before the procedure or as directed by your health care provider.  Arrange for someone to drive you home after the procedure. Do not drive yourself home. During the procedure, you will be given medicines that can continue to make you feel drowsy and can impair your reflexes.  An IV access tube will be started in the arm. PROCEDURE   A medicine to help you relax (sedative) will be given through the IV access tube.  A medicine may be sprayed or gargled to numb the back of the throat.  Your blood pressure, heart rate, and breathing (vital signs) will be monitored during the procedure.  The TEE probe is a long, flexible tube. The tip of the probe is placed into the back of the mouth, and you will be asked to swallow. This helps to pass the tip of the probe into the esophagus. Once the tip of the probe is in the correct area, your health care provider can take pictures of the heart.  TEE is usually not a painful procedure. You may feel the probe press against the back of the throat. The probe does not enter the trachea and does not affect your breathing. AFTER THE  PROCEDURE   You will be in bed, resting, until you have fully returned to consciousness.  When you first awaken, your throat may feel slightly sore and will probably still feel numb. This will improve slowly over time.  You will not be allowed to eat or drink until it is clear that the numbness has improved.  Once you have been able to drink, urinate, and sit on the edge of the bed without feeling sick to your stomach (nausea) or dizzy, you may be cleared to go home.  You should have a friend or family member with you for the next 24 hours after your procedure. Document Released: 07/09/2002 Document Revised: 04/23/2013 Document Reviewed: 10/18/2012 [facility] Patient Information 2015 [place], [place]. This information is not intended to replace advice given to you by your health care provider. Make sure you discuss any questions you have with your health care provider. Electrical Cardioversion, Care After Refer to this sheet in the next few weeks. These instructions provide you with information on caring for yourself after your procedure. Your health care provider may also give you more specific instructions. Your treatment  has been planned according to current medical practices, but problems sometimes occur. Call your health care provider if you have any problems or questions after your procedure. WHAT TO EXPECT AFTER THE PROCEDURE After your procedure, it is typical to have the following sensations:  Some redness on the skin where the shocks were delivered. If this is tender, a sunburn lotion or hydrocortisone cream may help.  Possible return of an abnormal heart rhythm within hours or days after the procedure. HOME CARE INSTRUCTIONS  Take medicines only as directed by your health care provider. Be sure you understand how and when to take your medicine.  Learn how to feel your pulse and check it often.  Limit your activity for 48 hours after the procedure or as directed by your health care  provider.  Avoid or minimize caffeine and other stimulants as directed by your health care provider. SEEK MEDICAL CARE IF:  You feel like your heart is beating too fast or your pulse is not regular.  You have any questions about your medicines.  You have bleeding that will not stop. SEEK IMMEDIATE MEDICAL CARE IF:  You are dizzy or feel faint.  It is hard to breathe or you feel short of breath.  There is a change in discomfort in your chest.  Your speech is slurred or you have trouble moving an arm or leg on one side of your body.  You get a serious muscle cramp that does not go away.  Your fingers or toes turn cold or blue. Document Released: 02/06/2013 Document Revised: 09/02/2013 Document Reviewed: 02/06/2013 [facility] Patient Information 2015 [place], [place]. This information is not intended to replace advice given to you by your health care provider. Make sure you discuss any questions you have with your health care provider.

## 2014-02-11 NOTE — Progress Notes (Signed)
Echocardiogram Echocardiogram Transesophageal has been performed.  Joelene Millin 02/11/2014, 12:10 PM

## 2014-02-11 NOTE — Anesthesia Postprocedure Evaluation (Signed)
  Anesthesia Post-op Note  Patient: Cody Ortega  Procedure(s) Performed: Procedure(s): CARDIOVERSION (N/A) TRANSESOPHAGEAL ECHOCARDIOGRAM (TEE) (N/A)  Patient Location:Endo  Anesthesia Type:MAC  Level of Consciousness: awake  Airway and Oxygen Therapy: Patient Spontanous Breathing  Post-op Pain: mild  Post-op Assessment: Post-op Vital signs reviewed  Post-op Vital Signs: Reviewed  Last Vitals:  Filed Vitals:   02/11/14 1225  BP: 105/63  Pulse: 43  Resp: 16    Complications: No apparent anesthesia complications

## 2014-02-11 NOTE — H&P (Signed)
     INTERVAL PROCEDURE H&P  History and Physical Interval Note:  02/11/2014 11:35 AM  Cody Ortega has presented today for their planned procedure. The various methods of treatment have been discussed with the patient and family. After consideration of risks, benefits and other options for treatment, the patient has consented to the procedure.  The patients' outpatient history has been reviewed, patient examined, and no change in status from most recent office note within the past 30 days. I have reviewed the patients' chart and labs and will proceed as planned. Questions were answered to the patient's satisfaction.   Pixie Casino, MD, [facility] Attending Cardiologist [facility]  HILTY,Kenneth C 02/11/2014

## 2014-02-11 NOTE — Progress Notes (Signed)
Post cardioversion pt with bradycardia..hoarseness 41-44 with hypotension....88/52 ie. pre cardioversion 148/88... Dr. Debara Pickett present, Medtronic AICD interrogated, and  Demand pacer has captured several times d/t bradycardia Considering admitting pt overnight for observation.  .EP Service notified and Luetta Nutting, NP for Dr. Rayann Heman at bedside.  POC is to hold pt for 2 hours to assess HR and B/p.  Dr. Rayann Heman to come by and see pt also.   KJHenderson,RN     Current Facility-Administered Medications Ordered in Epic  Medication Dose Route Frequency Provider Last Rate Last Dose  . 0.9 %  sodium chloride infusion   Intravenous Continuous Pixie Casino, MD 20 mL/hr at 02/11/14 1120 1,000 mL at 02/11/14 1120   No current Epic-ordered outpatient prescriptions on file.   lab

## 2014-02-11 NOTE — Anesthesia Preprocedure Evaluation (Addendum)
Anesthesia Evaluation  Patient identified by MRN, date of birth, ID band Patient awake    Reviewed: Allergy & Precautions, H&P , NPO status , Patient's Chart, lab work & pertinent test results  Airway Mallampati: II      Dental   Pulmonary asthma , former smoker,          Cardiovascular hypertension, + CAD and +CHF + Cardiac Defibrillator     Neuro/Psych    GI/Hepatic   Endo/Other    Renal/GU Renal disease     Musculoskeletal   Abdominal   Peds  Hematology   Anesthesia Other Findings   Reproductive/Obstetrics                          Anesthesia Physical Anesthesia Plan  ASA: IV  Anesthesia Plan: MAC   Post-op Pain Management:    Induction: Intravenous  Airway Management Planned: Simple Face Mask  Additional Equipment:   Intra-op Plan:   Post-operative Plan:   Informed Consent: I have reviewed the patients History and Physical, chart, labs and discussed the procedure including the risks, benefits and alternatives for the proposed anesthesia with the patient or authorized representative who has indicated his/her understanding and acceptance.   Dental advisory given  Plan Discussed with: CRNA and Anesthesiologist  Anesthesia Plan Comments:        Anesthesia Quick Evaluation

## 2014-02-12 ENCOUNTER — Encounter (HOSPITAL_COMMUNITY): Payer: Self-pay | Admitting: Internal Medicine

## 2014-02-19 ENCOUNTER — Encounter: Payer: Medicaid Other | Admitting: Internal Medicine

## 2014-02-19 ENCOUNTER — Encounter: Payer: Self-pay | Admitting: Internal Medicine

## 2014-02-19 ENCOUNTER — Ambulatory Visit (INDEPENDENT_AMBULATORY_CARE_PROVIDER_SITE_OTHER): Payer: Medicaid Other | Admitting: Internal Medicine

## 2014-02-19 VITALS — BP 122/72 | HR 49 | Ht 75.0 in | Wt 300.0 lb

## 2014-02-19 DIAGNOSIS — I429 Cardiomyopathy, unspecified: Secondary | ICD-10-CM

## 2014-02-19 DIAGNOSIS — I480 Paroxysmal atrial fibrillation: Secondary | ICD-10-CM

## 2014-02-19 DIAGNOSIS — I495 Sick sinus syndrome: Secondary | ICD-10-CM

## 2014-02-19 DIAGNOSIS — I5022 Chronic systolic (congestive) heart failure: Secondary | ICD-10-CM

## 2014-02-19 LAB — MDC_IDC_ENUM_SESS_TYPE_INCLINIC
Battery Voltage: 3.1 V
Date Time Interrogation Session: 20151021143353
HighPow Impedance: 0 Ohm
HighPow Impedance: 342 Ohm
HighPow Impedance: 48 Ohm
HighPow Impedance: 65 Ohm
Lead Channel Pacing Threshold Amplitude: 1 V
Lead Channel Setting Pacing Amplitude: 2.5 V
Lead Channel Setting Sensing Sensitivity: 0.3 mV
MDC IDC MSMT LEADCHNL RV IMPEDANCE VALUE: 342 Ohm
MDC IDC MSMT LEADCHNL RV PACING THRESHOLD PULSEWIDTH: 0.4 ms
MDC IDC MSMT LEADCHNL RV SENSING INTR AMPL: 10.625 mV
MDC IDC SET LEADCHNL RV PACING PULSEWIDTH: 0.4 ms
MDC IDC SET ZONE DETECTION INTERVAL: 280 ms
MDC IDC STAT BRADY RV PERCENT PACED: 0.17 %
Zone Setting Detection Interval: 360 ms
Zone Setting Detection Interval: 450 ms

## 2014-02-19 MED ORDER — CARVEDILOL 6.25 MG PO TABS: 6.2500 mg | ORAL_TABLET | Freq: Two times a day (BID) | ORAL | Status: DC

## 2014-02-19 MED ORDER — AMIODARONE HCL 200 MG PO TABS: 200.0000 mg | ORAL_TABLET | Freq: Every day | ORAL | Status: DC

## 2014-02-19 NOTE — Patient Instructions (Signed)
Your physician recommends that you schedule a follow-up appointment in: 3 months with Dr Lovena Le  Your physician has recommended you make the following change in your medication:  1) Decrease Amiodarone to 200mg  daily 2) Decrease Carvedilol to 6.25mg  twice daily

## 2014-02-19 NOTE — Progress Notes (Signed)
HPI Cody Ortega returns today for followup. He is a 53 year old man with a history of of ischemic as well as nonischemic cardiomyopathy, class II systolic heart failure now class III, s/p ICD, recently diagnosed atrial fibrillation with a rapid ventricular response, and depression. He was hospitalized with atrial fibrillation several weeks ago and left the hospital [facility]. He subsequently underwent transesophageal echo which demonstrated a left atrial appendage thrombus. He was placed on NOAC x  4 weeks and loaded on amiodaone and then underwent successfull DCCV.   Today, he reports he is doing well. Feels less short of breath but never was aware of palpitations.Has asymptomatic bradycardia s/p cardioversion. Reports is taking meds as directed. Has sleep apnea, intolerant of cpap. Denies any chest discomfort, no pedal edema. Allergies  Allergen Reactions  . Codeine Itching and Nausea And Vomiting  . Hydrocodone-Acetaminophen Itching and Nausea And Vomiting     Current Outpatient Prescriptions  Medication Sig Dispense Refill  . albuterol (PROVENTIL) (2.5 MG/3ML) 0.083% nebulizer solution Take 2.5 mg by nebulization every 6 (six) hours as needed for wheezing or shortness of breath.      Marland Kitchen albuterol (VENTOLIN HFA) 108 (90 BASE) MCG/ACT inhaler Inhale 2 puffs into the lungs every 6 (six) hours as needed for wheezing or shortness of breath.       . alprazolam (XANAX) 2 MG tablet Take 2 mg by mouth 4 (four) times daily.       Marland Kitchen amiodarone (PACERONE) 200 MG tablet Take 1 tablet (200 mg total) by mouth daily.  120 tablet  1  . amLODipine (NORVASC) 10 MG tablet Take 10 mg by mouth daily with supper.       Marland Kitchen aspirin 81 MG EC tablet Take 81 mg by mouth daily.        . carvedilol (COREG) 6.25 MG tablet Take 1 tablet (6.25 mg total) by mouth 2 (two) times daily with a meal.  180 tablet  3  . cycloSPORINE (RESTASIS) 0.05 % ophthalmic emulsion Place 1 drop into both eyes 2 (two) times  daily.      . ferrous sulfate 325 (65 FE) MG tablet Take 325 mg by mouth 2 (two) times daily with a meal.      . furosemide (LASIX) 40 MG tablet Take 40 mg by mouth 2 (two) times daily as needed. As needed for weight over 300 lbs      . hydrALAZINE (APRESOLINE) 25 MG tablet Take 12.5 mg by mouth 3 (three) times daily.       Marland Kitchen ibuprofen (ADVIL,MOTRIN) 800 MG tablet Take 800 mg by mouth every 8 (eight) hours as needed for moderate pain.      . potassium chloride SA (K-DUR,KLOR-CON) 20 MEQ tablet Take 2 tablets (40 mEq total) by mouth daily.  60 tablet  1  . pravastatin (PRAVACHOL) 80 MG tablet Take 80 mg by mouth at bedtime.      . rivaroxaban (XARELTO) 20 MG TABS tablet Take 1 tablet (20 mg total) by mouth daily with supper.  30 tablet  4  . sildenafil (VIAGRA) 100 MG tablet Take 50 mg by mouth daily as needed for erectile dysfunction.       . traZODone (DESYREL) 50 MG tablet Take 50 mg by mouth at bedtime as needed for sleep.        No current facility-administered medications for this visit.     Past Medical History  Diagnosis Date  . Chronic systolic congestive heart failure  Acute decompensation, LVEF less than 20%  . Hypertension   . History of cocaine abuse   . Bronchial asthma   . Benzodiazepine dependence   . Anxiety   . Depression   . Microcytic anemia   . CKD (chronic kidney disease), stage IV     Stage 3-4  . 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 PAF (paroxysmal atrial fibrillation)     a. Episode in 2008 with spontaneous conversion  . CAD (coronary artery disease)     a. Cath 03/2010: mod RCA stenosis, severe diag stenosis, treated medically given lack of angina.  . Stroke 2004  . S/P implantation of automatic cardioverter/defibrillator (AICD)     a. Medtronic, implanted 2012.    ROS:   All systems reviewed and negative except as noted in the HPI.   Past Surgical History  Procedure Laterality Date  . Tonsillectomy    . Transthoracic echocardiogram   10/2006, 12/2009  . Tee without cardioversion N/A 01/07/2014    Procedure: TRANSESOPHAGEAL ECHOCARDIOGRAM (TEE);  Surgeon: Lelon Perla, MD;  Location: [facility] ENDOSCOPY;  Service: Cardiovascular;  Laterality: N/A;  . Cardioversion N/A 01/07/2014    Procedure: CARDIOVERSION;  Surgeon: Lelon Perla, MD;  Location: [facility] ENDOSCOPY;  Service: Cardiovascular;  Laterality: N/A;  . Cardioversion N/A 02/11/2014    Procedure: CARDIOVERSION;  Surgeon: Pixie Casino, MD;  Location: [facility] ENDOSCOPY;  Service: Cardiovascular;  Laterality: N/A;  . Tee without cardioversion N/A 02/11/2014    Procedure: TRANSESOPHAGEAL ECHOCARDIOGRAM (TEE);  Surgeon: Pixie Casino, MD;  Location: [facility] ENDOSCOPY;  Service: Cardiovascular;  Laterality: N/A;     Family History  Problem Relation Age of Onset  . Breast cancer Mother   . Multiple myeloma Mother   . Prostate cancer Father   . Diabetes Father   . Cerebral palsy Daughter      History   Social History  . Marital Status: Divorced    Spouse Name: N/A    Number of Children: N/A  . Years of Education: N/A   Occupational History  . Unemployed     basketball referee in the past   Social History Main Topics  . Smoking status: Former Research scientist (life sciences)  . Smokeless tobacco: Not on file  . Alcohol Use: Yes     Comment: occ  . Drug Use: No     Comment: Reported history of cocaine abuse off and on  . Sexual Activity: Not on file   Other Topics Concern  . Not on file   Social History Narrative   Living with a son who is a 35 year old.  He is not     working, unemployed for 3 years.  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  Denied use of alcohol.  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           BP 122/72  Pulse 49  Ht $R'6\' 3"'Wy$  (1.905 m)  Wt 300 lb (136.079 kg)  BMI 37.50 kg/m2   . Physical Exam: Unkept  appearing middle-aged man, NAD HEENT: Unremarkable Neck:  No JVD, no thyromegally Back:  No CVA tenderness Lungs:  Clear  except for basilar rales HEART:  Regular rate rhythm, no murmurs, no rubs, no clicks Abd:  soft, positive bowel sounds, no organomegally, no rebound, no guarding Ext:  2 plus pulses, no edema, no cyanosis, no clubbing Skin:  No rashes no nodules Extremities: no swelling Neuro:  CN  II through XII intact, motor grossly intact  EKG - Marked sinus bradycardia 49 bpm rightward axis.  DEVICE  Normal device function.  See PaceArt for details.   Assess/Plan: 1. Afib s/p cardioversion with bradycardia Decrease amiodarone to one tablet a day. Decrease carvedilol to 6.25 mg bid. Continue xarelto for a CHA2DS2VASc of at least 4.   This patients CHA2DS2-VASc Score and unadjusted Ischemic Stroke Rate (% per year) is equal to 4.8 % stroke rate/year from a score of 4  Above score calculated as 1 point each if present [CHF, HTN, DM, Vascular=MI/PAD/Aortic Plaque, Age if 65-74, or Male] Above score calculated as 2 points each if present [Age > 75, or Stroke/TIA/TE]     Renal insufficiency with Creat clearance cal on bmet 10/6 with a creat of 2.1 but with cl of 80.$Remove'1mg'AtJHoYh$ /dl on appropriate dose of xarelto.  2. CAD, stable F/u with Dr. Burt Knack as scheduled 03/17/14.  3.Cardiomyopathy, s/p ICD, stable F/u Dr. Lovena Le in 3 months.

## 2014-02-20 DIAGNOSIS — I495 Sick sinus syndrome: Secondary | ICD-10-CM | POA: Insufficient documentation

## 2014-03-01 ENCOUNTER — Other Ambulatory Visit: Payer: Self-pay | Admitting: Cardiovascular Disease

## 2014-03-01 ENCOUNTER — Other Ambulatory Visit: Payer: Self-pay | Admitting: Internal Medicine

## 2014-03-12 ENCOUNTER — Other Ambulatory Visit: Payer: Self-pay | Admitting: Cardiovascular Disease

## 2014-03-14 ENCOUNTER — Other Ambulatory Visit: Payer: Self-pay

## 2014-03-14 MED ORDER — CARVEDILOL 6.25 MG PO TABS: 6.2500 mg | ORAL_TABLET | Freq: Two times a day (BID) | ORAL | Status: DC

## 2014-03-17 ENCOUNTER — Encounter: Payer: Self-pay | Admitting: Cardiovascular Disease

## 2014-03-17 ENCOUNTER — Ambulatory Visit (INDEPENDENT_AMBULATORY_CARE_PROVIDER_SITE_OTHER): Payer: Medicaid Other | Admitting: Cardiovascular Disease

## 2014-03-17 VITALS — BP 102/72 | HR 63 | Ht 75.0 in | Wt 294.0 lb

## 2014-03-17 DIAGNOSIS — I429 Cardiomyopathy, unspecified: Secondary | ICD-10-CM

## 2014-03-17 DIAGNOSIS — I5022 Chronic systolic (congestive) heart failure: Secondary | ICD-10-CM

## 2014-03-17 NOTE — Patient Instructions (Signed)
Your physician has requested that you have an echocardiogram in 4 MONTHS. Echocardiography is a painless test that uses sound waves to create images of your heart. It provides your doctor with information about the size and shape of your heart and how well your heart's chambers and valves are working. This procedure takes approximately one hour. There are no restrictions for this procedure.  Your physician recommends that you return for lab work in: 4 MONTHS (LIVER, BMP and TSH)  Your physician recommends that you schedule a follow-up appointment in: 4 MONTHS with Dr Burt Knack  Your physician recommends that you continue on your current medications as directed. Please refer to the Current Medication list given to you today.

## 2014-03-17 NOTE — Progress Notes (Signed)
Background: the patient is followed for cardiomyopathy, chronic systolic heart failure, chronic kidney disease, and paroxysmal atrial fibrillation.  HPI:  53 year old gentleman presenting for follow-up evaluation. He was hospitalized in August 2015 with atrial fibrillation with RVR. He left the hospital against medical advice during that admission. The patient was treated with oral amiodarone and was set up for TEE cardioversion. He had left atrial appendage thrombus so anticoagulation with Xarelto was continued another 4 weeks and he was subsequently cardioverted successfully. He presents today for follow-up evaluation.  He's feeling better now. Breathing is improved and energy level is better, but still has fatigue. No chest pain or pressure. No edema, orthopnea, or PND. He also complains of lightheadedness with postural changes.   Studies:  TEE 02/11/2014: Left ventricle: There was mild concentric hypertrophy. Diffuse hypokinesis. LVEF <20%. No evidence of thrombus.  ------------------------------------------------------------------- Aortic valve:  Structurally normal valve.  Cusp separation was normal. No evidence of vegetation. Doppler: There was no regurgitation.  ------------------------------------------------------------------- Aorta: Not well visualized.  ------------------------------------------------------------------- Mitral valve:  Doppler: There was mild regurgitation.  ------------------------------------------------------------------- Left atrium: The atrium was dilated. No evidence of thrombus in the atrial cavity or appendage. No evidence of thrombus in the atrial cavity or appendage. Heavy LA smoke was noted.  ------------------------------------------------------------------- Atrial septum: No defect or patent foramen ovale was identified.  ------------------------------------------------------------------- Right ventricle: Poorly visualized.  Pacer wire or catheter noted in right ventricle.  ------------------------------------------------------------------- Pulmonic valve:  Poorly visualized.  ------------------------------------------------------------------- Tricuspid valve: Moderate TR.  ------------------------------------------------------------------- Pulmonary artery:  The main pulmonary artery was normal-sized.  ------------------------------------------------------------------- Right atrium: The atrium was dilated. Pacer wire or catheter noted in right atrium with a small amount of thrombus noted to be adhearent to the wire.  ------------------------------------------------------------------- Pericardium: There was no pericardial effusion.  ------------------------------------------------------------------- Post procedure conclusions Ascending Aorta:  - Not well visualized.  Outpatient Encounter Prescriptions as of 03/17/2014  Medication Sig  . albuterol (PROVENTIL) (2.5 MG/3ML) 0.083% nebulizer solution Take 2.5 mg by nebulization every 6 (six) hours as needed for wheezing or shortness of breath.  Marland Kitchen albuterol (VENTOLIN HFA) 108 (90 BASE) MCG/ACT inhaler Inhale 2 puffs into the lungs every 6 (six) hours as needed for wheezing or shortness of breath.   . alprazolam (XANAX) 2 MG tablet Take 2 mg by mouth 4 (four) times daily.   Marland Kitchen amiodarone (PACERONE) 200 MG tablet Take 1 tablet (200 mg total) by mouth daily.  Marland Kitchen amLODipine (NORVASC) 10 MG tablet Take 10 mg by mouth daily with supper.   Marland Kitchen aspirin 81 MG EC tablet Take 81 mg by mouth daily.    . carvedilol (COREG) 25 MG tablet Take 1/2 tablet twice daily  . cycloSPORINE (RESTASIS) 0.05 % ophthalmic emulsion Place 1 drop into both eyes 2 (two) times daily.  . ferrous sulfate 325 (65 FE) MG tablet Take 325 mg by mouth 2 (two) times daily with a meal.  . furosemide (LASIX) 40 MG tablet TAKE 1 TABLET BY MOUTH TWICE DAILY AS NEEDED FOR WEIGHT OVER 300LBS  .  hydrALAZINE (APRESOLINE) 25 MG tablet Take 12.5 mg by mouth 3 (three) times daily.   Marland Kitchen ibuprofen (ADVIL,MOTRIN) 800 MG tablet Take 800 mg by mouth every 8 (eight) hours as needed for moderate pain.  . potassium chloride SA (K-DUR,KLOR-CON) 20 MEQ tablet Take 2 tablets (40 mEq total) by mouth daily.  . pravastatin (PRAVACHOL) 80 MG tablet Take 80 mg by mouth at bedtime.  . rivaroxaban (XARELTO) 20 MG TABS tablet Take 1 tablet (20  mg total) by mouth daily with supper.  . sildenafil (VIAGRA) 100 MG tablet Take 50 mg by mouth daily as needed for erectile dysfunction.   . traZODone (DESYREL) 50 MG tablet Take 50 mg by mouth at bedtime as needed for sleep.   . [DISCONTINUED] carvedilol (COREG) 6.25 MG tablet Take 1 tablet (6.25 mg total) by mouth 2 (two) times daily with a meal.  . [DISCONTINUED] hydrALAZINE (APRESOLINE) 50 MG tablet   . [DISCONTINUED] pravastatin (PRAVACHOL) 80 MG tablet TAKE 1 TABLET BY MOUTH EVERY NIGHT AT BEDTIME    Allergies  Allergen Reactions  . Codeine Itching and Nausea And Vomiting  . Hydrocodone-Acetaminophen Itching and Nausea And Vomiting    Past Medical History  Diagnosis Date  . Chronic systolic congestive heart failure     Acute decompensation, LVEF less than 20%  . Hypertension   . History of cocaine abuse   . Bronchial asthma   . Benzodiazepine dependence   . Anxiety   . Depression   . Microcytic anemia   . CKD (chronic kidney disease), stage IV     Stage 3-4  . 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 PAF (paroxysmal atrial fibrillation)     a. Episode in 2008 with spontaneous conversion  . CAD (coronary artery disease)     a. Cath 03/2010: mod RCA stenosis, severe diag stenosis, treated medically given lack of angina.  . Stroke 2004  . S/P implantation of automatic cardioverter/defibrillator (AICD)     a. Medtronic, implanted 2012.    family history includes Breast cancer in his mother; Cerebral palsy in his daughter; Diabetes in his  father; Multiple myeloma in his mother; Prostate cancer in his father.   ROS: Negative except as per HPI  BP 102/72 mmHg  Pulse 63  Ht $R'6\' 3"'ba$  (1.905 m)  Wt 294 lb (133.358 kg)  BMI 36.75 kg/m2  SpO2 97%  PHYSICAL EXAM: Pt is alert and oriented, overweight male in NAD HEENT: normal Neck: JVP - normal, carotids 2+= without bruits Lungs: CTA bilaterally CV: RRR without murmur or gallop Abd: soft, NT, Positive BS, no hepatomegaly Ext: no C/C/E, distal pulses intact and equal Skin: warm/dry no rash  ASSESSMENT AND PLAN: 1. PAF: in sinus rhythm after cardioversion. Suspect he will require amiodarone long-term for maintenance of sinus rhythm. He will continue Xarelto for anticoagulation. Will follow-up in 4 months. He will require labwork for monitoring of amiodarone: TSH, LFT's prior to his return visit.   2. Chronic systolic heart failure, decompensation likely related to AF with RVR. Seems stable on current medical therapy. Recommend repeat 2D Echo in 4 months prior to his return office visit.   3. CAD, native vessel, without symptoms of angina. Continue med management.   4. Malignant HTN: currently controlled. Admits to some dizziness. BP is always labile. Continue same Rx for now.   [name], MD 03/17/2014 2:07 PM

## 2014-03-31 ENCOUNTER — Other Ambulatory Visit: Payer: Self-pay | Admitting: Cardiovascular Disease

## 2014-04-28 ENCOUNTER — Ambulatory Visit: Payer: Medicaid Other | Admitting: Cardiology

## 2014-06-02 ENCOUNTER — Other Ambulatory Visit: Payer: Self-pay | Admitting: Internal Medicine

## 2014-06-13 ENCOUNTER — Other Ambulatory Visit (INDEPENDENT_AMBULATORY_CARE_PROVIDER_SITE_OTHER): Payer: Medicaid Other | Admitting: *Deleted

## 2014-06-13 ENCOUNTER — Ambulatory Visit (HOSPITAL_COMMUNITY): Payer: Medicaid Other | Attending: Cardiovascular Disease

## 2014-06-13 DIAGNOSIS — I5022 Chronic systolic (congestive) heart failure: Secondary | ICD-10-CM | POA: Diagnosis not present

## 2014-06-13 DIAGNOSIS — I429 Cardiomyopathy, unspecified: Secondary | ICD-10-CM | POA: Diagnosis not present

## 2014-06-13 LAB — BASIC METABOLIC PANEL
BUN: 23 mg/dL (ref 6–23)
CHLORIDE: 105 meq/L (ref 96–112)
CO2: 30 meq/L (ref 19–32)
Calcium: 9.1 mg/dL (ref 8.4–10.5)
Creatinine, Ser: 1.59 mg/dL — ABNORMAL HIGH (ref 0.40–1.50)
GFR: 48.89 mL/min — AB (ref >60.00)
GLUCOSE: 89 mg/dL (ref 70–99)
POTASSIUM: 3.4 meq/L — AB (ref 3.5–5.1)
SODIUM: 141 meq/L (ref 135–145)

## 2014-06-13 LAB — HEPATIC FUNCTION PANEL
ALK PHOS: 68 U/L (ref 39–117)
ALT: 59 U/L — ABNORMAL HIGH (ref 0–53)
AST: 44 U/L — ABNORMAL HIGH (ref 0–37)
Albumin: 3.9 g/dL (ref 3.5–5.2)
BILIRUBIN DIRECT: 0.2 mg/dL (ref 0.0–0.3)
Total Bilirubin: 0.8 mg/dL (ref 0.2–1.2)
Total Protein: 7.3 g/dL (ref 6.0–8.3)

## 2014-06-13 LAB — TSH: TSH: 2.49 u[IU]/mL (ref 0.35–4.50)

## 2014-06-13 NOTE — Progress Notes (Signed)
2D Echo completed. 06/13/2014

## 2014-06-13 NOTE — Addendum Note (Signed)
Addended by: Eulis Foster on: 06/13/2014 11:22 AM   Modules accepted: Orders

## 2014-07-01 ENCOUNTER — Other Ambulatory Visit: Payer: Self-pay | Admitting: Cardiovascular Disease

## 2014-07-04 ENCOUNTER — Ambulatory Visit (INDEPENDENT_AMBULATORY_CARE_PROVIDER_SITE_OTHER): Payer: Medicaid Other | Admitting: Cardiovascular Disease

## 2014-07-04 ENCOUNTER — Encounter: Payer: Self-pay | Admitting: Cardiovascular Disease

## 2014-07-04 VITALS — BP 132/78 | HR 61 | Ht 75.0 in | Wt 300.8 lb

## 2014-07-04 DIAGNOSIS — I10 Essential (primary) hypertension: Secondary | ICD-10-CM

## 2014-07-04 DIAGNOSIS — I495 Sick sinus syndrome: Secondary | ICD-10-CM

## 2014-07-04 DIAGNOSIS — R0602 Shortness of breath: Secondary | ICD-10-CM

## 2014-07-04 NOTE — Patient Instructions (Addendum)
Your physician recommends that you continue on your current medications as directed. Please refer to the Current Medication list given to you today.  Your physician has recommended that you have a pulmonary function test. Pulmonary Function Tests are a group of tests that measure how well air moves in and out of your lungs.  A chest x-ray takes a picture of the organs and structures inside the chest, including the heart, lungs, and blood vessels. This test can show several things, including, whether the heart is enlarges; whether fluid is building up in the lungs; and whether pacemaker / defibrillator leads are still in place. Cody Ortega   Your physician wants you to follow-up in: 6 months with Dr. Burt Knack. You will receive a reminder letter in the mail two months in advance. If you don't receive a letter, please call our office to schedule the follow-up appointment.6 months

## 2014-07-04 NOTE — Progress Notes (Signed)
Cardiology Office Note   Date:  07/06/2014   ID:  Cody Ortega, DOB 18-Dec-1960, MRN [id number]  PCP:  No primary care provider on file.  Cardiologist:  Tonny Bollman, MD    Chief Complaint  Patient presents with  . Shortness of Breath    dizzy, headaches     History of Present Illness: Cody Ortega is a 54 y.o. male who presents for follow-up of cardiomyopathy. The patient was last seen in November 2015. He has a long history of nonischemic cardiomyopathy, chronic kidney disease, coronary artery disease, and polysubstance abuse. He has been clean now for several years. He was hospitalized in August 2015 with atrial fibrillation and RVR. He left the hospital AGAINST MEDICAL ADVICE. He was ultimately treated with oral amiodarone followed by TEE cardioversion. There was left atrial appendage thrombus so his cardioversion was delayed. His LVEF was markedly reduced around this time. The patient's last echocardiogram from February 2016 showed improvement in LV function, with LVEF 30-35%. This was close to his baseline.  Today, patient reports occasional dyspnea that occurs at rest or with exertion. He states he feels that he "cannot get a big enough breath." He reports associated chest pressure that only lasts a few seconds. He states his dyspnea has improved significantly after his cardioversion and that his dyspnea now is not as bad as before. He attributes his dyspnea to his asthma, stating it mildly improves after using his albuterol inhaler. He denies any palpitations, orthopnea, leg swelling.     Past Medical History  Diagnosis Date  . Chronic systolic congestive heart failure     Acute decompensation, LVEF less than 20%  . Hypertension   . History of cocaine abuse   . Bronchial asthma   . Benzodiazepine dependence   . Anxiety   . Depression   . Microcytic anemia   . CKD (chronic kidney disease), stage IV     Stage 3-4  . 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  PAF (paroxysmal atrial fibrillation)     a. Episode in 2008 with spontaneous conversion  . CAD (coronary artery disease)     a. Cath 03/2010: mod RCA stenosis, severe diag stenosis, treated medically given lack of angina.  . Stroke 2004  . S/P implantation of automatic cardioverter/defibrillator (AICD)     a. Medtronic, implanted 2012.    Past Surgical History  Procedure Laterality Date  . Tonsillectomy    . Transthoracic echocardiogram  10/2006, 12/2009  . Tee without cardioversion N/A 01/07/2014    Procedure: TRANSESOPHAGEAL ECHOCARDIOGRAM (TEE);  Surgeon: Lewayne Bunting, MD;  Location: [facility] ENDOSCOPY;  Service: Cardiovascular;  Laterality: N/A;  . Cardioversion N/A 01/07/2014    Procedure: CARDIOVERSION;  Surgeon: Lewayne Bunting, MD;  Location: [facility] ENDOSCOPY;  Service: Cardiovascular;  Laterality: N/A;  . Cardioversion N/A 02/11/2014    Procedure: CARDIOVERSION;  Surgeon: Chrystie Nose, MD;  Location: [facility] ENDOSCOPY;  Service: Cardiovascular;  Laterality: N/A;  . Tee without cardioversion N/A 02/11/2014    Procedure: TRANSESOPHAGEAL ECHOCARDIOGRAM (TEE);  Surgeon: Chrystie Nose, MD;  Location: [facility] ENDOSCOPY;  Service: Cardiovascular;  Laterality: N/A;    Current Outpatient Prescriptions  Medication Sig Dispense Refill  . albuterol (PROVENTIL) (2.5 MG/3ML) 0.083% nebulizer solution Take 2.5 mg by nebulization every 6 (six) hours as needed for wheezing or shortness of breath.    Marland Kitchen albuterol (VENTOLIN HFA) 108 (90 BASE) MCG/ACT inhaler Inhale 2 puffs into the lungs every 6 (six) hours as  needed for wheezing or shortness of breath.     . alprazolam (XANAX) 2 MG tablet Take 2 mg by mouth 4 (four) times daily.     Marland Kitchen amiodarone (PACERONE) 200 MG tablet Take 1 tablet (200 mg total) by mouth daily. 120 tablet 1  . amLODipine (NORVASC) 10 MG tablet TAKE 1 TABLET BY MOUTH EVERY DAY 30 tablet 0  . aspirin 81 MG EC tablet Take 81 mg by mouth daily.      . carvedilol (COREG) 6.25 MG tablet Take  6.25 mg by mouth 2 (two) times daily with a meal.   3  . cycloSPORINE (RESTASIS) 0.05 % ophthalmic emulsion Place 1 drop into both eyes 2 (two) times daily.    . ferrous sulfate 325 (65 FE) MG tablet Take 325 mg by mouth 2 (two) times daily with a meal.    . furosemide (LASIX) 40 MG tablet TAKE 1 TABLET BY MOUTH TWICE DAILY AS NEEDED FOR WEIGHT OVER 300LBS 60 tablet 3  . hydrALAZINE (APRESOLINE) 25 MG tablet Take 12.5 mg by mouth 3 (three) times daily.     Marland Kitchen ibuprofen (ADVIL,MOTRIN) 800 MG tablet Take 800 mg by mouth every 8 (eight) hours as needed for moderate pain.    . potassium chloride SA (K-DUR,KLOR-CON) 20 MEQ tablet TAKE 2 TABLETS BY MOUTH DAILY 60 tablet 5  . pravastatin (PRAVACHOL) 80 MG tablet Take 80 mg by mouth at bedtime.    . sildenafil (VIAGRA) 100 MG tablet Take 50 mg by mouth daily as needed for erectile dysfunction.     . traZODone (DESYREL) 50 MG tablet Take 50 mg by mouth at bedtime as needed for sleep.     Alveda Reasons 20 MG TABS tablet TAKE 1 TABLET BY MOUTH EVERY DAY WITH SUPPER 30 tablet 3   No current facility-administered medications for this visit.    Allergies:   Codeine and Hydrocodone-acetaminophen   Social History:  The patient  reports that he has quit smoking. He does not have any smokeless tobacco history on file. He reports that he drinks alcohol. He reports that he does not use illicit drugs.   Family History:  The patient's  family history includes Breast cancer in his mother; Cerebral palsy in his daughter; Diabetes in his father; Multiple myeloma in his mother; Prostate cancer in his father.    ROS:  Please see the history of present illness.  Otherwise, review of systems is positive for dyspnea, headache.  All other systems are reviewed and negative.    PHYSICAL EXAM: VS:  BP 132/78 mmHg  Pulse 61  Ht $R'6\' 3"'FJ$  (1.905 m)  Wt 300 lb 12.8 oz (136.442 kg)  BMI 37.60 kg/m2 , BMI Body mass index is 37.6 kg/(m^2). GEN: Well nourished, well developed, in no  acute distress HEENT: normal Neck: no JVD, no masses, no carotid bruits Cardiac: RRR without murmur or gallop                Respiratory:  clear to auscultation bilaterally, normal work of breathing GI: soft, nontender, nondistended, + BS MS: no deformity or atrophy Ext: no pretibial edema Skin: warm and dry, no rash Neuro:  Strength and sensation are intact Psych: euthymic mood, full affect  EKG:  EKG is ordered today. The ekg ordered today shows NSR with nonspecific T wave abnormality. HR 61 bpm.  Recent Labs: 12/31/2013: Hemoglobin 15.7; Platelets 207.0 06/13/2014: ALT 59*; BUN 23; Creatinine 1.59*; Potassium 3.4*; Sodium 141; TSH 2.49   Lipid Panel  Component Value Date/Time   CHOL 101 01/09/2014 1213   TRIG 81.0 01/09/2014 1213   HDL 27.40* 01/09/2014 1213   CHOLHDL 4 01/09/2014 1213   VLDL 16.2 01/09/2014 1213   LDLCALC 57 01/09/2014 1213      Wt Readings from Last 3 Encounters:  07/04/14 300 lb 12.8 oz (136.442 kg)  03/17/14 294 lb (133.358 kg)  02/19/14 300 lb (136.079 kg)     Cardiac Studies Reviewed: 2-D echocardiogram 06/13/2014: Study Conclusions  - Left ventricle: The cavity size was severely dilated. Wall thickness was increased in a pattern of moderate LVH. Systolic function was moderately to severely reduced. The estimated ejection fraction was in the range of 30% to 35%. Diffuse hypokinesis. Doppler parameters are consistent with abnormal left ventricular relaxation (grade 1 diastolic dysfunction). - Left atrium: The atrium was moderately dilated. - Right atrium: The atrium was moderately dilated.  ASSESSMENT AND PLAN: 1.  Paroxysmal atrial fibrillation: Maintaining sinus rhythm on amiodarone and tolerating anticoagulation.   2. Chronic systolic heart failure, near Heart Association functional class 2. Overall appears euvolemic. Medical program reviewed and will be continued without changes. BP controlled.  3. Coronary artery disease,  native vessel, without symptoms of angina: continue same Rx.  4. Malignant hypertension: BP has been very well-controlled.   5. Chronic kidney disease stage III: Recent creatinine was 1.6. This is improved from his baseline.  6. Hyperlipidemia: Last lipids with excellent readings as outlined above.  7. Shortness of breath. Unclear etiology. May be multifactorial with CHF, obesity, CAD, HTN, etc. However, have to be concerned about amiodarone lung toxicity. Will check a CXR and PFT's. I think amio toxicity is fairly unlikely.  Current medicines are reviewed with the patient today.  The patient does not have concerns regarding medicines.  The following changes have been made:  no change  Labs/ tests ordered today include:   Orders Placed This Encounter  Procedures  . DG Chest 2 View  . EKG 12-Lead  . Pulmonary function test    Disposition:   FU 6 months.  Signed, [name], MD  07/06/2014 3:30 PM    [address], [place], [place]  [address] Phone: [phone]; Fax: [phone]

## 2014-07-06 ENCOUNTER — Encounter: Payer: Self-pay | Admitting: Cardiovascular Disease

## 2014-07-09 ENCOUNTER — Ambulatory Visit
Admission: RE | Admit: 2014-07-09 | Discharge: 2014-07-09 | Disposition: A | Discharge status: Home / Self Care | Payer: Medicaid Other | Source: Ambulatory Visit | Attending: Cardiovascular Disease | Admitting: Cardiovascular Disease

## 2014-07-09 DIAGNOSIS — I495 Sick sinus syndrome: Secondary | ICD-10-CM

## 2014-07-09 DIAGNOSIS — R0602 Shortness of breath: Secondary | ICD-10-CM

## 2014-07-09 DIAGNOSIS — I10 Essential (primary) hypertension: Secondary | ICD-10-CM

## 2014-07-10 ENCOUNTER — Other Ambulatory Visit: Payer: Self-pay

## 2014-07-10 MED ORDER — AZITHROMYCIN 250 MG PO TABS: ORAL_TABLET | ORAL | Status: DC

## 2014-07-15 ENCOUNTER — Ambulatory Visit (INDEPENDENT_AMBULATORY_CARE_PROVIDER_SITE_OTHER): Payer: Medicaid Other

## 2014-07-15 DIAGNOSIS — I10 Essential (primary) hypertension: Secondary | ICD-10-CM

## 2014-07-15 DIAGNOSIS — I4891 Unspecified atrial fibrillation: Secondary | ICD-10-CM

## 2014-07-15 DIAGNOSIS — Z95810 Presence of automatic (implantable) cardiac defibrillator: Secondary | ICD-10-CM

## 2014-07-15 NOTE — Progress Notes (Signed)
EKG performed due to interaction between pt's medications and Zpak.  The pt is feeling a little bit better at this time but does have some congestion in his upper chest.  The pt stopped taking OTC guaifenesin and I advised him to restart this medication because it will help break up chest congestion. EKG placed in Dr Antionette Char folder for signature (QT/QTc 486/498ms). Previous EKG for comparison QT/QTc 484/456ms.

## 2014-07-17 NOTE — Progress Notes (Signed)
Reviewed EKG. The patient has mild QT prolongation with no change from baseline.

## 2014-07-30 ENCOUNTER — Ambulatory Visit (INDEPENDENT_AMBULATORY_CARE_PROVIDER_SITE_OTHER): Payer: Medicaid Other | Admitting: Internal Medicine

## 2014-07-30 DIAGNOSIS — I10 Essential (primary) hypertension: Secondary | ICD-10-CM

## 2014-07-30 DIAGNOSIS — R0602 Shortness of breath: Secondary | ICD-10-CM

## 2014-07-30 DIAGNOSIS — I495 Sick sinus syndrome: Secondary | ICD-10-CM

## 2014-07-30 LAB — PULMONARY FUNCTION TEST
DL/VA % pred: 85 %
DL/VA: 4.17 ml/min/mmHg/L
DLCO unc % pred: 80 %
DLCO unc: 31.47 ml/min/mmHg
FEF 25-75 PRE: 2.34 L/s
FEF2575-%PRED-PRE: 60 %
FEV1-%Pred-Pre: 74 %
FEV1-Pre: 3.36 L
FEV1FVC-%PRED-PRE: 88 %
FEV6-%Pred-Pre: 86 %
FEV6-Pre: 4.89 L
FEV6FVC-%Pred-Pre: 103 %
FVC-%Pred-Pre: 83 %
PRE FEV6/FVC RATIO: 100 %
Pre FEV1/FVC ratio: 68 %
RV % pred: 122 %
RV: 2.87 L
TLC % pred: 99 %
TLC: 7.96 L

## 2014-07-30 NOTE — Progress Notes (Signed)
PFT done today. 

## 2014-08-04 ENCOUNTER — Other Ambulatory Visit: Payer: Self-pay | Admitting: Cardiovascular Disease

## 2014-08-21 ENCOUNTER — Encounter: Payer: Self-pay | Admitting: Cardiovascular Disease

## 2014-08-21 NOTE — Telephone Encounter (Signed)
Left message for pt to call back  °

## 2014-08-21 NOTE — Telephone Encounter (Signed)
New Message      Pt calling stating that Dr. Burt Knack referred him to a Pulmonologist and he spoke to that office to try to get his results for some tests they did and was told that he needed to contact Dr. Burt Knack to get his results. Please call back and advise.

## 2014-08-25 NOTE — Telephone Encounter (Signed)
This encounter was created in error - please disregard.

## 2014-09-04 ENCOUNTER — Other Ambulatory Visit: Payer: Self-pay | Admitting: Cardiovascular Disease

## 2014-09-05 NOTE — Telephone Encounter (Signed)
Per note 3.4.16 per Dr Burt Knack

## 2014-10-01 ENCOUNTER — Other Ambulatory Visit: Payer: Self-pay | Admitting: Internal Medicine

## 2014-10-07 ENCOUNTER — Other Ambulatory Visit: Payer: Self-pay | Admitting: Cardiovascular Disease

## 2014-10-07 ENCOUNTER — Other Ambulatory Visit: Payer: Self-pay | Admitting: Internal Medicine

## 2014-11-07 ENCOUNTER — Other Ambulatory Visit: Payer: Self-pay | Admitting: Internal Medicine

## 2015-01-03 ENCOUNTER — Other Ambulatory Visit: Payer: Self-pay | Admitting: Cardiovascular Disease

## 2015-01-07 ENCOUNTER — Other Ambulatory Visit: Payer: Self-pay | Admitting: Internal Medicine

## 2015-01-14 ENCOUNTER — Telehealth: Payer: Self-pay

## 2015-01-14 NOTE — Telephone Encounter (Signed)
Received records request from [facility], forwarded to [facility] for processing.

## 2015-01-23 ENCOUNTER — Telehealth: Payer: Self-pay

## 2015-01-23 NOTE — Telephone Encounter (Signed)
Received records request Disability Determination Services, forwarded to [employer] for processing.  

## 2015-02-05 ENCOUNTER — Other Ambulatory Visit: Payer: Self-pay | Admitting: Cardiovascular Disease

## 2015-02-17 ENCOUNTER — Other Ambulatory Visit: Payer: Self-pay | Admitting: Cardiovascular Disease

## 2015-02-25 ENCOUNTER — Emergency Department (HOSPITAL_COMMUNITY): Payer: Medicaid Other

## 2015-02-25 ENCOUNTER — Encounter (HOSPITAL_COMMUNITY): Payer: Self-pay | Admitting: Emergency Medicine

## 2015-02-25 ENCOUNTER — Emergency Department (HOSPITAL_COMMUNITY)
Admission: EM | Admit: 2015-02-25 | Discharge: 2015-02-25 | Disposition: A | Discharge status: Home / Self Care | Payer: Medicaid Other | Attending: Emergency Medicine | Admitting: Emergency Medicine

## 2015-02-25 DIAGNOSIS — D649 Anemia, unspecified: Secondary | ICD-10-CM | POA: Diagnosis not present

## 2015-02-25 DIAGNOSIS — I5022 Chronic systolic (congestive) heart failure: Secondary | ICD-10-CM | POA: Insufficient documentation

## 2015-02-25 DIAGNOSIS — M5431 Sciatica, right side: Secondary | ICD-10-CM | POA: Insufficient documentation

## 2015-02-25 DIAGNOSIS — Z8673 Personal history of transient ischemic attack (TIA), and cerebral infarction without residual deficits: Secondary | ICD-10-CM | POA: Diagnosis not present

## 2015-02-25 DIAGNOSIS — F419 Anxiety disorder, unspecified: Secondary | ICD-10-CM | POA: Insufficient documentation

## 2015-02-25 DIAGNOSIS — Z7982 Long term (current) use of aspirin: Secondary | ICD-10-CM | POA: Insufficient documentation

## 2015-02-25 DIAGNOSIS — F329 Major depressive disorder, single episode, unspecified: Secondary | ICD-10-CM | POA: Diagnosis not present

## 2015-02-25 DIAGNOSIS — Z79899 Other long term (current) drug therapy: Secondary | ICD-10-CM | POA: Insufficient documentation

## 2015-02-25 DIAGNOSIS — Z87891 Personal history of nicotine dependence: Secondary | ICD-10-CM | POA: Insufficient documentation

## 2015-02-25 DIAGNOSIS — I129 Hypertensive chronic kidney disease with stage 1 through stage 4 chronic kidney disease, or unspecified chronic kidney disease: Secondary | ICD-10-CM | POA: Insufficient documentation

## 2015-02-25 DIAGNOSIS — M25551 Pain in right hip: Secondary | ICD-10-CM | POA: Diagnosis present

## 2015-02-25 DIAGNOSIS — Z95810 Presence of automatic (implantable) cardiac defibrillator: Secondary | ICD-10-CM | POA: Insufficient documentation

## 2015-02-25 DIAGNOSIS — J45909 Unspecified asthma, uncomplicated: Secondary | ICD-10-CM | POA: Diagnosis not present

## 2015-02-25 DIAGNOSIS — I2510 Atherosclerotic heart disease of native coronary artery without angina pectoris: Secondary | ICD-10-CM | POA: Insufficient documentation

## 2015-02-25 DIAGNOSIS — N184 Chronic kidney disease, stage 4 (severe): Secondary | ICD-10-CM | POA: Insufficient documentation

## 2015-02-25 MED ORDER — DEXAMETHASONE SODIUM PHOSPHATE 10 MG/ML IJ SOLN
10.0000 mg | Freq: Once | INTRAMUSCULAR | Status: AC
  Administered 2015-02-25: 10 mg via INTRAMUSCULAR

## 2015-02-25 MED ORDER — DEXAMETHASONE SODIUM PHOSPHATE 10 MG/ML IJ SOLN
10.0000 mg | Freq: Once | INTRAMUSCULAR | Status: DC
  Filled 2015-02-25: qty 1

## 2015-02-25 MED ORDER — OXYCODONE-ACETAMINOPHEN 5-325 MG PO TABS: 1.0000 | ORAL_TABLET | Freq: Four times a day (QID) | ORAL | Status: DC | PRN

## 2015-02-25 MED ORDER — OXYCODONE-ACETAMINOPHEN 5-325 MG PO TABS
1.0000 | ORAL_TABLET | Freq: Once | ORAL | Status: AC
  Administered 2015-02-25: 2 via ORAL
  Filled 2015-02-25: qty 2

## 2015-02-25 NOTE — ED Notes (Signed)
Pt states over the last week he's been developing right hip pain, did nothing to injure it that he's aware of. States he was driving yesterday and felt a sudden burst of pain in his hip that he says made his leg feel numb. States it went away but the pain has been progressing since then. Pulse, sensation, movement intact in RLE.

## 2015-02-25 NOTE — ED Provider Notes (Signed)
CSN: [id number]     Arrival date & time 02/25/15  1637 History   First MD Initiated Contact with Patient 02/25/15 1926     Chief Complaint  Patient presents with  . Hip Pain   HPI  [name] is a 54 yo M PMH CHF, HTN, cocaine abuse, benzodiazepine dependence, paroxysmal a-fib, AICD, who presents with a 1.5 week history of right hip pain that has gotten progressively worse over the last day. He states he was driving yesterday when he felt a sudden burst of pain in his hip that made his leg feel numb. The pain radiates from his right buttocks down his leg laterally to his foot. The pain is constant, sharp, 10/10 pain scale. Moving and bearing weight aggravate his pain. He took an Aleve around 13:00 today, and this provided minimal relief. He admits to taking 800 mg Advil per day on a regular basis. No fever, dizziness, HA, CP, palpitations, abdominal pain, nausea, vomiting, sweating, loss of bowel/bladder control, saddle paresthesias. In regards to his back pain history, he states he "blew L5 and had excruitiating pain for years," but this is a different pain.    Past Medical History  Diagnosis Date  . Chronic systolic congestive heart failure (HCC)     Acute decompensation, LVEF less than 20%  . Hypertension   . History of cocaine abuse   . Bronchial asthma   . Benzodiazepine dependence ([place])   . Anxiety   . Depression   . Microcytic anemia   . CKD (chronic kidney disease), stage IV (HCC)     Stage 3-4  . 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 PAF (paroxysmal atrial fibrillation) ([place])     a. Episode in 2008 with spontaneous conversion  . CAD (coronary artery disease)     a. Cath 03/2010: mod RCA stenosis, severe diag stenosis, treated medically given lack of angina.  . Stroke ([place]) 2004  . S/P implantation of automatic cardioverter/defibrillator (AICD)     a. Medtronic, implanted 2012.   Past Surgical History  Procedure Laterality Date  . Tonsillectomy    . Transthoracic  echocardiogram  10/2006, 12/2009  . Tee without cardioversion N/A 01/07/2014    Procedure: TRANSESOPHAGEAL ECHOCARDIOGRAM (TEE);  Surgeon: Lelon Perla, MD;  Location: [facility] ENDOSCOPY;  Service: Cardiovascular;  Laterality: N/A;  . Cardioversion N/A 01/07/2014    Procedure: CARDIOVERSION;  Surgeon: Lelon Perla, MD;  Location: [facility] ENDOSCOPY;  Service: Cardiovascular;  Laterality: N/A;  . Cardioversion N/A 02/11/2014    Procedure: CARDIOVERSION;  Surgeon: Pixie Casino, MD;  Location: [facility] ENDOSCOPY;  Service: Cardiovascular;  Laterality: N/A;  . Tee without cardioversion N/A 02/11/2014    Procedure: TRANSESOPHAGEAL ECHOCARDIOGRAM (TEE);  Surgeon: Pixie Casino, MD;  Location: [facility] ENDOSCOPY;  Service: Cardiovascular;  Laterality: N/A;   Family History  Problem Relation Age of Onset  . Breast cancer Mother   . Multiple myeloma Mother   . Prostate cancer Father   . Diabetes Father   . Cerebral palsy Daughter    Social History  Substance Use Topics  . Smoking status: Former Research scientist (life sciences)  . Smokeless tobacco: None  . Alcohol Use: Yes     Comment: occ    Review of Systems  Constitutional: Negative.   HENT: Negative.   Eyes: Negative.   Respiratory: Negative.   Cardiovascular: Negative.   Gastrointestinal: Negative.   Genitourinary: Negative.   Musculoskeletal: Positive for back pain. Negative for myalgias, joint swelling, arthralgias, gait problem, neck  pain and neck stiffness.  Skin: Negative.   Neurological: Positive for numbness. Negative for dizziness, tremors, seizures, syncope, facial asymmetry, speech difficulty, weakness, light-headedness and headaches.  Hematological: Negative.   Psychiatric/Behavioral: Negative.       Allergies  Codeine and Hydrocodone-acetaminophen  Home Medications   Prior to Admission medications   Medication Sig Start Date End Date Taking? Authorizing Provider  albuterol (PROVENTIL) (2.5 MG/3ML) 0.083% nebulizer solution Take 2.5 mg by  nebulization every 6 (six) hours as needed for wheezing or shortness of breath.   Yes Historical Provider, MD  albuterol (VENTOLIN HFA) 108 (90 BASE) MCG/ACT inhaler Inhale 2 puffs into the lungs every 6 (six) hours as needed for wheezing or shortness of breath.    Yes Historical Provider, MD  alprazolam Duanne Moron) 2 MG tablet Take 1-2 mg by mouth 4 (four) times daily as needed for anxiety.    Yes Historical Provider, MD  amiodarone (PACERONE) 200 MG tablet TAKE 1 TABLET BY MOUTH TWICE DAILY 01/08/15  Yes Thompson Grayer, MD  amLODipine (NORVASC) 10 MG tablet TAKE 1 TABLET BY MOUTH EVERY DAY 02/06/15  Yes [name], MD  aspirin 81 MG EC tablet Take 81 mg by mouth daily.     Yes Historical Provider, MD  carvedilol (COREG) 6.25 MG tablet Take 6.25 mg by mouth 2 (two) times daily with a meal.  06/17/14  Yes Historical Provider, MD  cycloSPORINE (RESTASIS) 0.05 % ophthalmic emulsion Place 1 drop into both eyes 2 (two) times daily.   Yes Historical Provider, MD  DULoxetine (CYMBALTA) 60 MG capsule Take 60 mg by mouth daily.   Yes Historical Provider, MD  ferrous sulfate 325 (65 FE) MG tablet Take 325 mg by mouth 2 (two) times daily with a meal.   Yes Historical Provider, MD  furosemide (LASIX) 40 MG tablet TAKE 1 TABLET BY MOUTH TWICE DAILY AS NEEDED FOR WEIGHT OVER 300 LBS 10/03/14  Yes [name], MD  hydrALAZINE (APRESOLINE) 25 MG tablet TAKE 1/2 TABLET BY MOUTH THREE TIMES DAILY 02/06/15  Yes [name], MD  ibuprofen (ADVIL,MOTRIN) 200 MG tablet Take 800 mg by mouth every 6 (six) hours as needed for moderate pain.   Yes Historical Provider, MD  naproxen sodium (ANAPROX) 220 MG tablet Take 220 mg by mouth 2 (two) times daily as needed (pain).   Yes Historical Provider, MD  Oxcarbazepine (TRILEPTAL) 300 MG tablet Take 900 mg by mouth at bedtime.   Yes Historical Provider, MD  potassium chloride SA (K-DUR,KLOR-CON) 20 MEQ tablet TAKE 2 TABLETS BY MOUTH DAILY 02/17/15  Yes [name], MD  pravastatin  (PRAVACHOL) 80 MG tablet TAKE 1 TABLET BY MOUTH EVERY NIGHT AT BEDTIME 02/17/15  Yes [name], MD  sildenafil (VIAGRA) 100 MG tablet Take 50 mg by mouth daily as needed for erectile dysfunction.    Yes Historical Provider, MD  traZODone (DESYREL) 50 MG tablet Take 50 mg by mouth at bedtime as needed for sleep.    Yes Historical Provider, MD  XARELTO 20 MG TABS tablet TAKE 1 TABLET BY MOUTH EVERY DAY WITH SUPPER 10/03/14  Yes [name], MD  azithromycin (ZITHROMAX Z-PAK) 250 MG tablet Take 2 tablets by mouth on Day 1 and then one tablet by mouth daily until complete Patient not taking: Reported on 02/25/2015 07/10/14   [name], MD   BP 158/85 mmHg  Pulse 62  Temp(Src) 98.4 F (36.9 C) (Oral)  Resp 16  SpO2 97% Physical Exam  Constitutional: He is oriented to person,  place, and time. He appears well-developed and well-nourished. No distress.  HENT:  Head: Normocephalic and atraumatic.  Mouth/Throat: Oropharynx is clear and moist. No oropharyngeal exudate.  Eyes: Conjunctivae are normal. Pupils are equal, round, and reactive to light. Right eye exhibits no discharge. Left eye exhibits no discharge. No scleral icterus.  Neck: Normal range of motion. No tracheal deviation present.  Cardiovascular: Normal rate, regular rhythm, normal heart sounds and intact distal pulses.  Exam reveals no gallop and no friction rub.   No murmur heard. Pulmonary/Chest: Effort normal and breath sounds normal. No respiratory distress. He has no wheezes. He has no rales. He exhibits no tenderness.  Abdominal: Soft. Bowel sounds are normal. He exhibits no distension and no mass. There is no tenderness. There is no rebound and no guarding.  Musculoskeletal: Normal range of motion. He exhibits tenderness. He exhibits no edema.  Neurovascularly intact bilaterally. Tenderness over sacroiliac joint. Positive straight leg raise.   Lymphadenopathy:    He has no cervical adenopathy.  Neurological: He is alert  and oriented to person, place, and time. Coordination normal.  Skin: Skin is warm and dry. No rash noted. He is not diaphoretic. No erythema.  Psychiatric: He has a normal mood and affect. His behavior is normal.  Nursing note and vitals reviewed.   ED Course  Procedures Imaging Review Dg Hip Unilat  With Pelvis 2-3 Views Right  02/25/2015  CLINICAL DATA:  Increasing right hip pain for 1 week. EXAM: DG HIP (WITH OR WITHOUT PELVIS) 2-3V RIGHT COMPARISON:  None. FINDINGS: There is no evidence of hip fracture or dislocation. There is no evidence of arthropathy or other focal bone abnormality. Vascular calcifications are noted. Soft tissues otherwise grossly unremarkable. IMPRESSION: No acute fracture or dislocation identified about the right hip. Electronically Signed   By: Fidela Salisbury M.D.   On: 02/25/2015 17:28   I have personally reviewed and evaluated these images and lab results as part of my medical decision-making.  MDM   Final diagnoses:  Sciatica of right side   Less likely AAA, dissection, urolithiasis, pyelonephritis, cauda equina, epidural abscess, tumor/mass. Will get xray to r/o fracture or dislocation.   Xray unremarkable for acute process. Most likely acute sciatica.   Patient has tolerated percocet in the past, will give percocet.   Patient states steroids have given him arrhythmias in the past, but he is unsure of which steroids exactly. He told me he had taken steroids for URI symptoms, and that it was a taper, so it sounds like prednisone. Will give decadron in house and observe.   Upon re-evaluation, pt's wife is at bedside. Pt states he feels much better. Discussed discharge with him and will give percocet to go home with. Advised patient to follow-up with PCP regarding his back pain. Patient to come back if he develops increased pain, new numbness/tingling or weakness, changes in bowel/bladder, or fevers.   [place], PA-C 03/01/15 [place], MD 03/02/15 [phone]

## 2015-02-25 NOTE — Discharge Instructions (Signed)
Mr. Niel,  Nice meeting you. Please return to the emergency department if you lose control of your bladder or bowels, fever, inability to walk. Please follow-up with your primary care provider within one week regarding your back pain. I hope you feel better soon!  S. Wendie Simmer, PA-C

## 2015-03-04 ENCOUNTER — Emergency Department (HOSPITAL_COMMUNITY)
Admission: EM | Admit: 2015-03-04 | Discharge: 2015-03-04 | Disposition: A | Discharge status: Home / Self Care | Payer: Medicaid Other | Attending: Emergency Medicine | Admitting: Emergency Medicine

## 2015-03-04 ENCOUNTER — Encounter (HOSPITAL_COMMUNITY): Payer: Self-pay | Admitting: Emergency Medicine

## 2015-03-04 DIAGNOSIS — F419 Anxiety disorder, unspecified: Secondary | ICD-10-CM | POA: Insufficient documentation

## 2015-03-04 DIAGNOSIS — Z7982 Long term (current) use of aspirin: Secondary | ICD-10-CM | POA: Diagnosis not present

## 2015-03-04 DIAGNOSIS — Z87891 Personal history of nicotine dependence: Secondary | ICD-10-CM | POA: Insufficient documentation

## 2015-03-04 DIAGNOSIS — Z79899 Other long term (current) drug therapy: Secondary | ICD-10-CM | POA: Insufficient documentation

## 2015-03-04 DIAGNOSIS — J45909 Unspecified asthma, uncomplicated: Secondary | ICD-10-CM | POA: Diagnosis not present

## 2015-03-04 DIAGNOSIS — N184 Chronic kidney disease, stage 4 (severe): Secondary | ICD-10-CM | POA: Diagnosis not present

## 2015-03-04 DIAGNOSIS — I5022 Chronic systolic (congestive) heart failure: Secondary | ICD-10-CM | POA: Diagnosis not present

## 2015-03-04 DIAGNOSIS — D509 Iron deficiency anemia, unspecified: Secondary | ICD-10-CM | POA: Insufficient documentation

## 2015-03-04 DIAGNOSIS — Z95810 Presence of automatic (implantable) cardiac defibrillator: Secondary | ICD-10-CM | POA: Insufficient documentation

## 2015-03-04 DIAGNOSIS — F329 Major depressive disorder, single episode, unspecified: Secondary | ICD-10-CM | POA: Diagnosis not present

## 2015-03-04 DIAGNOSIS — Z8673 Personal history of transient ischemic attack (TIA), and cerebral infarction without residual deficits: Secondary | ICD-10-CM | POA: Insufficient documentation

## 2015-03-04 DIAGNOSIS — I129 Hypertensive chronic kidney disease with stage 1 through stage 4 chronic kidney disease, or unspecified chronic kidney disease: Secondary | ICD-10-CM | POA: Insufficient documentation

## 2015-03-04 DIAGNOSIS — M5431 Sciatica, right side: Secondary | ICD-10-CM

## 2015-03-04 DIAGNOSIS — I2510 Atherosclerotic heart disease of native coronary artery without angina pectoris: Secondary | ICD-10-CM | POA: Insufficient documentation

## 2015-03-04 LAB — URINALYSIS, ROUTINE W REFLEX MICROSCOPIC
BILIRUBIN URINE: NEGATIVE
Glucose, UA: NEGATIVE mg/dL
Ketones, ur: NEGATIVE mg/dL
LEUKOCYTES UA: NEGATIVE
NITRITE: NEGATIVE
Protein, ur: 30 mg/dL — AB
SPECIFIC GRAVITY, URINE: 1.01 (ref 1.005–1.030)
UROBILINOGEN UA: 1 mg/dL (ref 0.0–1.0)
pH: 6 (ref 5.0–8.0)

## 2015-03-04 LAB — URINE MICROSCOPIC-ADD ON

## 2015-03-04 MED ORDER — OXYCODONE-ACETAMINOPHEN 5-325 MG PO TABS: 1.0000 | ORAL_TABLET | Freq: Four times a day (QID) | ORAL | Status: DC | PRN

## 2015-03-04 MED ORDER — DEXAMETHASONE SODIUM PHOSPHATE 10 MG/ML IJ SOLN
10.0000 mg | Freq: Once | INTRAMUSCULAR | Status: AC
  Administered 2015-03-04: 10 mg via INTRAMUSCULAR
  Filled 2015-03-04: qty 1

## 2015-03-04 MED ORDER — KETOROLAC TROMETHAMINE 60 MG/2ML IM SOLN
60.0000 mg | Freq: Once | INTRAMUSCULAR | Status: AC
  Administered 2015-03-04: 60 mg via INTRAMUSCULAR
  Filled 2015-03-04: qty 2

## 2015-03-04 NOTE — Discharge Instructions (Signed)
SEEK IMMEDIATE MEDICAL ATTENTION IF: New numbness, tingling, weakness, or problem with the use of your arms or legs.  Severe back pain not relieved with medications.  Change in bowel or bladder control.  Increasing pain in any areas of the body (such as chest or abdominal pain).  Shortness of breath, dizziness or fainting.  Nausea (feeling sick to your stomach), vomiting, fever, or sweats.   Sciatica Sciatica is pain, weakness, numbness, or tingling along the path of the sciatic nerve. The nerve starts in the lower back and runs down the back of each leg. The nerve controls the muscles in the lower leg and in the back of the knee, while also providing sensation to the back of the thigh, lower leg, and the sole of your foot. Sciatica is a symptom of another medical condition. For instance, nerve damage or certain conditions, such as a herniated disk or bone spur on the spine, pinch or put pressure on the sciatic nerve. This causes the pain, weakness, or other sensations normally associated with sciatica. Generally, sciatica only affects one side of the body. CAUSES   Herniated or slipped disc.  Degenerative disk disease.  A pain disorder involving the narrow muscle in the buttocks (piriformis syndrome).  Pelvic injury or fracture.  Pregnancy.  Tumor (rare). SYMPTOMS  Symptoms can vary from mild to very severe. The symptoms usually travel from the low back to the buttocks and down the back of the leg. Symptoms can include:  Mild tingling or dull aches in the lower back, leg, or hip.  Numbness in the back of the calf or sole of the foot.  Burning sensations in the lower back, leg, or hip.  Sharp pains in the lower back, leg, or hip.  Leg weakness.  Severe back pain inhibiting movement. These symptoms may get worse with coughing, sneezing, laughing, or prolonged sitting or standing. Also, being overweight may worsen symptoms. DIAGNOSIS  Your caregiver will perform a physical exam  to look for common symptoms of sciatica. He or she may ask you to do certain movements or activities that would trigger sciatic nerve pain. Other tests may be performed to find the cause of the sciatica. These may include:  Blood tests.  X-rays.  Imaging tests, such as an MRI or CT scan. TREATMENT  Treatment is directed at the cause of the sciatic pain. Sometimes, treatment is not necessary and the pain and discomfort goes away on its own. If treatment is needed, your caregiver may suggest:  Over-the-counter medicines to relieve pain.  Prescription medicines, such as anti-inflammatory medicine, muscle relaxants, or narcotics.  Applying heat or ice to the painful area.  Steroid injections to lessen pain, irritation, and inflammation around the nerve.  Reducing activity during periods of pain.  Exercising and stretching to strengthen your abdomen and improve flexibility of your spine. Your caregiver may suggest losing weight if the extra weight makes the back pain worse.  Physical therapy.  Surgery to eliminate what is pressing or pinching the nerve, such as a bone spur or part of a herniated disk. HOME CARE INSTRUCTIONS   Only take over-the-counter or prescription medicines for pain or discomfort as directed by your caregiver.  Apply ice to the affected area for 20 minutes, 3-4 times a day for the first 48-72 hours. Then try heat in the same way.  Exercise, stretch, or perform your usual activities if these do not aggravate your pain.  Attend physical therapy sessions as directed by your caregiver.  Keep  all follow-up appointments as directed by your caregiver.  Do not wear high heels or shoes that do not provide proper support.  Check your mattress to see if it is too soft. A firm mattress may lessen your pain and discomfort. SEEK IMMEDIATE MEDICAL CARE IF:   You lose control of your bowel or bladder (incontinence).  You have increasing weakness in the lower back, pelvis,  buttocks, or legs.  You have redness or swelling of your back.  You have a burning sensation when you urinate.  You have pain that gets worse when you lie down or awakens you at night.  Your pain is worse than you have experienced in the past.  Your pain is lasting longer than 4 weeks.  You are suddenly losing weight without reason. MAKE SURE YOU:  Understand these instructions.  Will watch your condition.  Will get help right away if you are not doing well or get worse.   This information is not intended to replace advice given to you by your health care provider. Make sure you discuss any questions you have with your health care provider.   Document Released: 04/12/2001 Document Revised: 01/07/2015 Document Reviewed: 08/28/2011 Elsevier Interactive Patient Education [employer].

## 2015-03-04 NOTE — ED Notes (Addendum)
Pt c/o right buttock sciatic pain. Reports he received a steroid shot and prescription for Percocet on a recent visit here for same s/s. Pt says, "I've done well not to use all of my pain medication fast, because it should have already been gone." Says steroid shot relieved pain but says, "I was going to see if I could get some more pain medication because my PCP appointment isn't until the 14th." Also reports a decubitis ulcer on the left lateral thigh d/t lying on that side d/t pain on the other side. Says, "I should mention when I pee it feels tight like it takes a lot of force to come out. When I sneeze or cough, I feel like the back of my hamstrings are wobbling like a shiver, like I'm cold." No other c/c. Ambulatory with steady gait.

## 2015-03-04 NOTE — ED Provider Notes (Signed)
CSN: [id number]     Arrival date & time 03/04/15  1742 History   First MD Initiated Contact with Patient 03/04/15 1927     No chief complaint on file.    (Consider location/radiation/quality/duration/timing/severity/associated sxs/prior Treatment) HPI  Cody Ortega Is a 54 year old male with a past medical history of depression, anxiety, and new onset right-sided sciatica. Patient was seen 03/04/2015 for sciatica. He was given a shot of Decadron. Patient states that his pain is greatly improved. However, he is continuing to have severe sharp pain which radiates down the back of his leg, especially with certain movements. He states that he has had difficulty completing ADLs and using the bathroom as getting into a sitting position causes severe pain. He states that even though his pain is still severe. It is improved from previous. Patient is hoping to get his pain  addressed again prior to his follow-up on 03/16/2015. Patient states he is having some difficulty getting his urinary stream started. He denies any other urinary symptoms. He denies red flag symptoms of weakness, saddle anesthesia, overflow incontinence or stool incontinence.  Past Medical History  Diagnosis Date  . Chronic systolic congestive heart failure (HCC)     Acute decompensation, LVEF less than 20%  . Hypertension   . History of cocaine abuse   . Bronchial asthma   . Benzodiazepine dependence ([place])   . Anxiety   . Depression   . Microcytic anemia   . CKD (chronic kidney disease), stage IV (HCC)     Stage 3-4  . 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 PAF (paroxysmal atrial fibrillation) ([place])     a. Episode in 2008 with spontaneous conversion  . CAD (coronary artery disease)     a. Cath 03/2010: mod RCA stenosis, severe diag stenosis, treated medically given lack of angina.  . Stroke ([place]) 2004  . S/P implantation of automatic cardioverter/defibrillator (AICD)     a. Medtronic, implanted 2012.    Past Surgical History  Procedure Laterality Date  . Tonsillectomy    . Transthoracic echocardiogram  10/2006, 12/2009  . Tee without cardioversion N/A 01/07/2014    Procedure: TRANSESOPHAGEAL ECHOCARDIOGRAM (TEE);  Surgeon: Lelon Perla, MD;  Location: [facility] ENDOSCOPY;  Service: Cardiovascular;  Laterality: N/A;  . Cardioversion N/A 01/07/2014    Procedure: CARDIOVERSION;  Surgeon: Lelon Perla, MD;  Location: [facility] ENDOSCOPY;  Service: Cardiovascular;  Laterality: N/A;  . Cardioversion N/A 02/11/2014    Procedure: CARDIOVERSION;  Surgeon: Pixie Casino, MD;  Location: [facility] ENDOSCOPY;  Service: Cardiovascular;  Laterality: N/A;  . Tee without cardioversion N/A 02/11/2014    Procedure: TRANSESOPHAGEAL ECHOCARDIOGRAM (TEE);  Surgeon: Pixie Casino, MD;  Location: [facility] ENDOSCOPY;  Service: Cardiovascular;  Laterality: N/A;   Family History  Problem Relation Age of Onset  . Breast cancer Mother   . Multiple myeloma Mother   . Prostate cancer Father   . Diabetes Father   . Cerebral palsy Daughter    Social History  Substance Use Topics  . Smoking status: Former Research scientist (life sciences)  . Smokeless tobacco: None  . Alcohol Use: Yes     Comment: occ    Review of Systems  Ten systems reviewed and are negative for acute change, except as noted in the HPI.    Allergies  Codeine and Hydrocodone-acetaminophen  Home Medications   Prior to Admission medications   Medication Sig Start Date End Date Taking? Authorizing Provider  albuterol (VENTOLIN HFA) 108 (90 BASE) MCG/ACT inhaler  Inhale 2 puffs into the lungs every 6 (six) hours as needed for wheezing or shortness of breath.    Yes Historical Provider, MD  alprazolam Duanne Moron) 2 MG tablet Take 1-2 mg by mouth 4 (four) times daily as needed for anxiety.    Yes Historical Provider, MD  amiodarone (PACERONE) 200 MG tablet TAKE 1 TABLET BY MOUTH TWICE DAILY 01/08/15  Yes Thompson Grayer, MD  amLODipine (NORVASC) 10 MG tablet TAKE 1 TABLET BY MOUTH EVERY DAY  02/06/15  Yes [name], MD  aspirin 81 MG EC tablet Take 81 mg by mouth daily.     Yes Historical Provider, MD  carvedilol (COREG) 6.25 MG tablet Take 6.25 mg by mouth 2 (two) times daily with a meal.  06/17/14  Yes Historical Provider, MD  cycloSPORINE (RESTASIS) 0.05 % ophthalmic emulsion Place 1 drop into both eyes 2 (two) times daily.   Yes Historical Provider, MD  DULoxetine (CYMBALTA) 60 MG capsule Take 60 mg by mouth daily.   Yes Historical Provider, MD  ferrous sulfate 325 (65 FE) MG tablet Take 325 mg by mouth 2 (two) times daily with a meal.   Yes Historical Provider, MD  furosemide (LASIX) 40 MG tablet TAKE 1 TABLET BY MOUTH TWICE DAILY AS NEEDED FOR WEIGHT OVER 300 LBS 10/03/14  Yes [name], MD  hydrALAZINE (APRESOLINE) 25 MG tablet TAKE 1/2 TABLET BY MOUTH THREE TIMES DAILY 02/06/15  Yes [name], MD  Oxcarbazepine (TRILEPTAL) 300 MG tablet Take 900 mg by mouth at bedtime.   Yes Historical Provider, MD  oxyCODONE-acetaminophen (PERCOCET/ROXICET) 5-325 MG tablet Take 1-2 tablets by mouth every 6 (six) hours as needed for severe pain. 02/25/15  Yes Climax Lions, PA-C  potassium chloride SA (K-DUR,KLOR-CON) 20 MEQ tablet TAKE 2 TABLETS BY MOUTH DAILY 02/17/15  Yes [name], MD  pravastatin (PRAVACHOL) 80 MG tablet TAKE 1 TABLET BY MOUTH EVERY NIGHT AT BEDTIME 02/17/15  Yes [name], MD  XARELTO 20 MG TABS tablet TAKE 1 TABLET BY MOUTH EVERY DAY WITH SUPPER 10/03/14  Yes [name], MD  azithromycin (ZITHROMAX Z-PAK) 250 MG tablet Take 2 tablets by mouth on Day 1 and then one tablet by mouth daily until complete Patient not taking: Reported on 02/25/2015 07/10/14   [name], MD  sildenafil (VIAGRA) 100 MG tablet Take 50 mg by mouth daily as needed for erectile dysfunction.     Historical Provider, MD  traZODone (DESYREL) 50 MG tablet Take 50 mg by mouth at bedtime as needed for sleep.     Historical Provider, MD   BP 172/95 mmHg  Pulse 60   Temp(Src) 98.2 F (36.8 C) (Oral)  Resp 18  SpO2 98% Physical Exam  Constitutional: He appears well-developed and well-nourished. No distress.  HENT:  Head: Normocephalic and atraumatic.  Eyes: Conjunctivae are normal. No scleral icterus.  Neck: Normal range of motion. Neck supple.  Cardiovascular: Normal rate, regular rhythm and normal heart sounds.   Pulmonary/Chest: Effort normal and breath sounds normal. No respiratory distress.  Abdominal: Soft. There is no tenderness.  Musculoskeletal:  Patient has pain with any movement of lying position. He has equal strength with hip flexion, extension bilaterally, normal DTRs. Spinal mobility is tee deferred as patient is unable to perform that currently.  Neurological: He is alert. He has normal reflexes.  Skin: Skin is warm and dry. He is not diaphoretic.  Psychiatric: His behavior is normal.  Nursing note and vitals reviewed.   ED Course  Procedures (including  critical care time) Labs Review Labs Reviewed - No data to display  Imaging Review No results found. I have personally reviewed and evaluated these images and lab results as part of my medical decision-making.   EKG Interpretation None      MDM   Final diagnoses:  Sciatica of right side   Offered patient pain control. Will refill meds and attempt multimodal pain control. 9:58 PM BP 168/83 mmHg  Pulse 53  Temp(Src) 98.2 F (36.8 C) (Oral)  Resp 20  SpO2 100% Patient with back pain.  No neurological deficits and normal neuro exam.  Patient can walk but states is painful.  No loss of bowel or bladder control.  No concern for cauda equina.  No fever, night sweats, weight loss, h/o cancer, IVDU.  RICE protocol and pain medicine indicated and discussed with patient.      Margarita Mail, PA-C 03/06/15 [place], PA-C 03/06/15 1316  Virgel Manifold, MD 03/06/15 1429

## 2015-03-13 ENCOUNTER — Other Ambulatory Visit: Payer: Self-pay

## 2015-03-13 MED ORDER — AMIODARONE HCL 200 MG PO TABS: 200.0000 mg | ORAL_TABLET | Freq: Two times a day (BID) | ORAL | Status: DC

## 2015-03-16 ENCOUNTER — Encounter: Payer: Self-pay | Admitting: Internal Medicine

## 2015-03-16 ENCOUNTER — Encounter: Payer: Self-pay | Admitting: Family Medicine

## 2015-03-16 ENCOUNTER — Ambulatory Visit: Payer: Medicaid Other | Attending: Family Medicine | Admitting: Family Medicine

## 2015-03-16 VITALS — BP 148/97 | HR 66 | Temp 98.4°F | Resp 18 | Ht 75.0 in | Wt 295.0 lb

## 2015-03-16 DIAGNOSIS — G894 Chronic pain syndrome: Secondary | ICD-10-CM | POA: Insufficient documentation

## 2015-03-16 DIAGNOSIS — G4733 Obstructive sleep apnea (adult) (pediatric): Secondary | ICD-10-CM | POA: Diagnosis not present

## 2015-03-16 DIAGNOSIS — Z0000 Encounter for general adult medical examination without abnormal findings: Secondary | ICD-10-CM | POA: Diagnosis not present

## 2015-03-16 DIAGNOSIS — M5441 Lumbago with sciatica, right side: Secondary | ICD-10-CM | POA: Insufficient documentation

## 2015-03-16 DIAGNOSIS — G8929 Other chronic pain: Secondary | ICD-10-CM

## 2015-03-16 MED ORDER — CYCLOBENZAPRINE HCL 10 MG PO TABS: 10.0000 mg | ORAL_TABLET | Freq: Three times a day (TID) | ORAL | Status: DC | PRN

## 2015-03-16 NOTE — Progress Notes (Signed)
Establish care  HFU ciatic nerve pain  Referral ortho Pain scale #6 No suicidal thought on the past two weeks

## 2015-03-16 NOTE — Progress Notes (Signed)
Patient ID: Cody Ortega, male   DOB: March 30, 1961, 54 y.o.   MRN: [id number]   Subjective:  Patient ID: Cody Ortega, male    DOB: [birth year]  Age: 54 y.o. MRN: [id number]  CC: Establish Care   HPI Cody Ortega [facility] presents for establish care, HTN, back pain   1. HTN: compliant with regimen. Followed by cards. No recent changes in regimen. No HA, CP, SOB or peripheral edema.   2. Back pain: 1990 "blew" out L5. 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 Lifting injury at work.  Treated by workman's compensation doctor and chiropractor. Diagnosed with L5 herniated disc. Lost weight. 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 Has never been pain free in low back. Pain level is 4/10 most of time in low back. L sided is affected more than other at baseline. Two weeks ago developed twinge in R calf, with spasm in R gluteal. Alnu,so had R sided back pain. Went to ED. Diagnosed with sciatica. Treated with IM steroid and pain medicine. Now 6/10 pain. Sitting on toilet has numbness in R leg. Taking aleve one as needed for pain. Declines additional pain medicine at this time. Also using ice and heat.   3. HM: declines flu shot. Amenable to GI referral for colonoscopy. He has never had one.   Past Medical History  Diagnosis Date  . Chronic systolic congestive heart failure (HCC)     Acute decompensation, LVEF less than 20%  . Hypertension   . History of cocaine abuse   . Bronchial asthma   . Benzodiazepine dependence ([place])   . Anxiety   . Depression   . Microcytic anemia   . CKD (chronic kidney disease), stage IV (HCC)     Stage 3-4  . 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 PAF (paroxysmal atrial fibrillation) ([place])     a. Episode in 2008 with spontaneous conversion  . CAD (coronary artery disease)     a. Cath 03/2010: mod RCA stenosis, severe diag stenosis, treated medically given lack of angina.  . Stroke ([place]) 2004  . S/P implantation of automatic  cardioverter/defibrillator (AICD)     a. Medtronic, implanted 2012.    Past Surgical History  Procedure Laterality Date  . Tonsillectomy    . Transthoracic echocardiogram  10/2006, 12/2009  . Tee without cardioversion N/A 01/07/2014    Procedure: TRANSESOPHAGEAL ECHOCARDIOGRAM (TEE);  Surgeon: Lelon Perla, MD;  Location: [facility] ENDOSCOPY;  Service: Cardiovascular;  Laterality: N/A;  . Cardioversion N/A 01/07/2014    Procedure: CARDIOVERSION;  Surgeon: Lelon Perla, MD;  Location: [facility] ENDOSCOPY;  Service: Cardiovascular;  Laterality: N/A;  . Cardioversion N/A 02/11/2014    Procedure: CARDIOVERSION;  Surgeon: Pixie Casino, MD;  Location: [facility] ENDOSCOPY;  Service: Cardiovascular;  Laterality: N/A;  . Tee without cardioversion N/A 02/11/2014    Procedure: TRANSESOPHAGEAL ECHOCARDIOGRAM (TEE);  Surgeon: Pixie Casino, MD;  Location: [facility] ENDOSCOPY;  Service: Cardiovascular;  Laterality: N/A;    Family History  Problem Relation Age of Onset  . Breast cancer Mother   . Multiple myeloma Mother   . Prostate cancer Father   . Diabetes Father   . Cerebral palsy Daughter     Social History  Substance Use Topics  . Smoking status: Former Research scientist (life sciences)  . Smokeless tobacco: Not on file  . Alcohol Use: Yes     Comment: occ    ROS Review of Systems  Constitutional: Negative for fever, chills, fatigue  and unexpected weight change.  Eyes: Negative for visual disturbance.  Respiratory: Negative for cough and shortness of breath.   Cardiovascular: Negative for chest pain, palpitations and leg swelling.  Gastrointestinal: Negative for nausea, vomiting, abdominal pain, diarrhea, constipation and blood in stool.  Endocrine: Negative for polydipsia, polyphagia and polyuria.  Musculoskeletal: Positive for myalgias, back pain and arthralgias. Negative for gait problem and neck pain.  Skin: Negative for rash.  Allergic/Immunologic: Negative for immunocompromised state.  Neurological: Positive for  numbness.  Hematological: Negative for adenopathy. Does not bruise/bleed easily.  Psychiatric/Behavioral: Negative for suicidal ideas, sleep disturbance and dysphoric mood. The patient is not nervous/anxious.    Objective:   Today's Vitals: BP 148/97 mmHg  Pulse 66  Temp(Src) 98.4 F (36.9 C) (Oral)  Resp 18  Ht $R'6\' 3"'Ba$  (1.905 m)  Wt 295 lb (133.811 kg)  BMI 36.87 kg/m2  SpO2 96% Wt Readings from Last 3 Encounters:  03/16/15 295 lb (133.811 kg)  07/04/14 300 lb 12.8 oz (136.442 kg)  03/17/14 294 lb (133.358 kg)   BP Readings from Last 3 Encounters:  03/16/15 148/97  03/04/15 172/88  02/25/15 173/94    Physical Exam  Constitutional: He appears well-developed and well-nourished. No distress.  HENT:  Head: Normocephalic and atraumatic.  Neck: Normal range of motion. Neck supple.  Cardiovascular: Normal rate, regular rhythm, normal heart sounds and intact distal pulses.   Pulmonary/Chest: Effort normal and breath sounds normal.  Musculoskeletal: He exhibits no edema.  Back Exam: Back: Normal Curvature, no deformities or CVA tenderness  Paraspinal Tenderness: present at L L4 and R L5/S1  LE Strength 5/5  LE Sensation: in tact  LE Reflexes 2+ and symmetric  Straight leg raise: + on R   Neurological: He is alert.  Skin: Skin is warm and dry. No rash noted. No erythema.  Psychiatric: He has a normal mood and affect.    Assessment & Plan:   Cody Ortega was seen today for establish care, hypertension and back pain.  Diagnoses and all orders for this visit:  Chronic pain syndrome  Obstructive sleep apnea -     Ambulatory referral to ENT  Chronic right-sided low back pain with right-sided sciatica -     Ambulatory referral to Physical Therapy -     cyclobenzaprine (FLEXERIL) 10 MG tablet; Take 1 tablet (10 mg total) by mouth 3 (three) times daily as needed for muscle spasms.  Healthcare maintenance -     Ambulatory referral to Gastroenterology    Outpatient Encounter  Prescriptions as of 03/16/2015  Medication Sig  . albuterol (VENTOLIN HFA) 108 (90 BASE) MCG/ACT inhaler Inhale 2 puffs into the lungs every 6 (six) hours as needed for wheezing or shortness of breath.   . alprazolam (XANAX) 2 MG tablet Take 1-2 mg by mouth 4 (four) times daily as needed for anxiety.   Marland Kitchen amiodarone (PACERONE) 200 MG tablet Take 1 tablet (200 mg total) by mouth 2 (two) times daily.  Marland Kitchen amLODipine (NORVASC) 10 MG tablet TAKE 1 TABLET BY MOUTH EVERY DAY  . aspirin 81 MG EC tablet Take 81 mg by mouth daily.    Marland Kitchen azithromycin (ZITHROMAX Z-PAK) 250 MG tablet Take 2 tablets by mouth on Day 1 and then one tablet by mouth daily until complete (Patient not taking: Reported on 02/25/2015)  . carvedilol (COREG) 6.25 MG tablet Take 6.25 mg by mouth 2 (two) times daily with a meal.   . cycloSPORINE (RESTASIS) 0.05 % ophthalmic emulsion Place 1 drop into both  eyes 2 (two) times daily.  . DULoxetine (CYMBALTA) 60 MG capsule Take 60 mg by mouth daily.  . ferrous sulfate 325 (65 FE) MG tablet Take 325 mg by mouth 2 (two) times daily with a meal.  . furosemide (LASIX) 40 MG tablet TAKE 1 TABLET BY MOUTH TWICE DAILY AS NEEDED FOR WEIGHT OVER 300 LBS  . hydrALAZINE (APRESOLINE) 25 MG tablet TAKE 1/2 TABLET BY MOUTH THREE TIMES DAILY  . Oxcarbazepine (TRILEPTAL) 300 MG tablet Take 900 mg by mouth at bedtime.  Marland Kitchen oxyCODONE-acetaminophen (PERCOCET/ROXICET) 5-325 MG tablet Take 1-2 tablets by mouth every 6 (six) hours as needed for severe pain.  . potassium chloride SA (K-DUR,KLOR-CON) 20 MEQ tablet TAKE 2 TABLETS BY MOUTH DAILY  . pravastatin (PRAVACHOL) 80 MG tablet TAKE 1 TABLET BY MOUTH EVERY NIGHT AT BEDTIME  . sildenafil (VIAGRA) 100 MG tablet Take 50 mg by mouth daily as needed for erectile dysfunction.   . traZODone (DESYREL) 50 MG tablet Take 50 mg by mouth at bedtime as needed for sleep.   Alveda Reasons 20 MG TABS tablet TAKE 1 TABLET BY MOUTH EVERY DAY WITH SUPPER   No facility-administered  encounter medications on file as of 03/16/2015.    Follow-up: No Follow-up on file.    [name] MD

## 2015-03-16 NOTE — Patient Instructions (Signed)
Brenten was seen today for establish care, hypertension and back pain.  Diagnoses and all orders for this visit:  Chronic pain syndrome  Obstructive sleep apnea -     Ambulatory referral to ENT  Chronic right-sided low back pain with right-sided sciatica -     Ambulatory referral to Physical Therapy -     cyclobenzaprine (FLEXERIL) 10 MG tablet; Take 1 tablet (10 mg total) by mouth 3 (three) times daily as needed for muscle spasms.  Healthcare maintenance -     Ambulatory referral to Gastroenterology   F/u in 4 weeks for BP check with pharmacy  F/u with me in 3 months for HTN   Dr. Adrian Blackwater

## 2015-03-18 ENCOUNTER — Other Ambulatory Visit: Payer: Self-pay | Admitting: Cardiovascular Disease

## 2015-03-18 ENCOUNTER — Other Ambulatory Visit: Payer: Self-pay | Admitting: Internal Medicine

## 2015-03-20 ENCOUNTER — Other Ambulatory Visit: Payer: Self-pay

## 2015-03-20 ENCOUNTER — Ambulatory Visit (INDEPENDENT_AMBULATORY_CARE_PROVIDER_SITE_OTHER): Payer: Medicaid Other | Admitting: Internal Medicine

## 2015-03-20 ENCOUNTER — Encounter: Payer: Self-pay | Admitting: Internal Medicine

## 2015-03-20 VITALS — BP 123/82 | HR 69 | Ht 75.0 in | Wt 291.0 lb

## 2015-03-20 DIAGNOSIS — I480 Paroxysmal atrial fibrillation: Secondary | ICD-10-CM | POA: Diagnosis not present

## 2015-03-20 DIAGNOSIS — I10 Essential (primary) hypertension: Secondary | ICD-10-CM | POA: Diagnosis not present

## 2015-03-20 DIAGNOSIS — Z95810 Presence of automatic (implantable) cardiac defibrillator: Secondary | ICD-10-CM

## 2015-03-20 DIAGNOSIS — I5022 Chronic systolic (congestive) heart failure: Secondary | ICD-10-CM

## 2015-03-20 MED ORDER — AMIODARONE HCL 200 MG PO TABS: 200.0000 mg | ORAL_TABLET | Freq: Every day | ORAL | Status: DC

## 2015-03-20 NOTE — Assessment & Plan Note (Signed)
His blood pressure is well controlled. No change in meds.  

## 2015-03-20 NOTE — Progress Notes (Signed)
HPI [name] returns today for followup. He is a 54 year old man with a history of of ischemic as well as nonischemic cardiomyopathy, class II systolic heart failure, recently diagnosed atrial fibrillation with a rapid ventricular response, and depression. He was hospitalized with atrial fibrillation over a year ago and ultimately underwent DCCV and has maintained NSR on amiodarone for the past year.  He denies medical noncompliance. He states specifically that he is taking his medicine daily. He admits to hip pain and was diagnosed with sciatica. No other complaints today. He still plays volleyball but is very sedentary. Allergies  Allergen Reactions  . Codeine Itching and Nausea And Vomiting  . Hydrocodone-Acetaminophen Itching and Nausea And Vomiting     Current Outpatient Prescriptions  Medication Sig Dispense Refill  . albuterol (VENTOLIN HFA) 108 (90 BASE) MCG/ACT inhaler Inhale 2 puffs into the lungs every 6 (six) hours as needed for wheezing or shortness of breath.     . alprazolam (XANAX) 2 MG tablet Take 1-2 mg by mouth 4 (four) times daily as needed for anxiety.     Marland Kitchen amiodarone (PACERONE) 200 MG tablet Take 1 tablet (200 mg total) by mouth 2 (two) times daily. 120 tablet 2  . amLODipine (NORVASC) 10 MG tablet TAKE 1 TABLET BY MOUTH EVERY DAY 30 tablet 1  . aspirin 81 MG EC tablet Take 81 mg by mouth daily.      . carvedilol (COREG) 6.25 MG tablet TAKE 1 TABLET BY MOUTH TWICE DAILY WITH A MEAL 180 tablet 0  . cyclobenzaprine (FLEXERIL) 10 MG tablet Take 1 tablet (10 mg total) by mouth 3 (three) times daily as needed for muscle spasms. 30 tablet 0  . cycloSPORINE (RESTASIS) 0.05 % ophthalmic emulsion Place 1 drop into both eyes 2 (two) times daily.    . DULoxetine (CYMBALTA) 60 MG capsule Take 60 mg by mouth daily.    . ferrous sulfate 325 (65 FE) MG tablet Take 325 mg by mouth 2 (two) times daily with a meal.    . furosemide (LASIX) 40 MG tablet TAKE 1 TABLET BY MOUTH  TWICE DAILY AS NEEDED FOR WEIGHT OVER 300 LBS 60 tablet 9  . hydrALAZINE (APRESOLINE) 25 MG tablet TAKE 1/2 TABLET BY MOUTH THREE TIMES DAILY 45 tablet 1  . Oxcarbazepine (TRILEPTAL) 300 MG tablet Take 900 mg by mouth at bedtime.    . potassium chloride SA (K-DUR,KLOR-CON) 20 MEQ tablet TAKE 2 TABLETS BY MOUTH DAILY 60 tablet 0  . pravastatin (PRAVACHOL) 80 MG tablet TAKE 1 TABLET BY MOUTH EVERY NIGHT AT BEDTIME 30 tablet 0  . sildenafil (VIAGRA) 100 MG tablet Take 50 mg by mouth daily as needed for erectile dysfunction.     . traZODone (DESYREL) 50 MG tablet Take 50 mg by mouth at bedtime as needed for sleep.     Alveda Reasons 20 MG TABS tablet TAKE 1 TABLET BY MOUTH EVERY DAY WITH SUPPER 30 tablet 9   No current facility-administered medications for this visit.     Past Medical History  Diagnosis Date  . Chronic systolic congestive heart failure (HCC)     Acute decompensation, LVEF less than 20%  . Hypertension   . History of cocaine abuse   . Bronchial asthma   . Benzodiazepine dependence ([place])   . Anxiety   . Depression   . Microcytic anemia   . CKD (chronic kidney disease), stage IV (HCC)     Stage 3-4  . Cardiomyopathy  possible cocaine induced  . PAF (paroxysmal atrial fibrillation) ([place])     a. Episode in 2008 with spontaneous conversion  . CAD (coronary artery disease)     a. Cath 03/2010: mod RCA stenosis, severe diag stenosis, treated medically given lack of angina.  . Stroke ([place]) 2004  . S/P implantation of automatic cardioverter/defibrillator (AICD)     a. Medtronic, implanted 2012.  Marland Kitchen Thyroid disease     ROS:   All systems reviewed and negative except as noted in the HPI.   Past Surgical History  Procedure Laterality Date  . Tonsillectomy    . Transthoracic echocardiogram  10/2006, 12/2009  . Tee without cardioversion N/A 01/07/2014    Procedure: TRANSESOPHAGEAL ECHOCARDIOGRAM (TEE);  Surgeon: Lelon Perla, MD;  Location: [facility] ENDOSCOPY;  Service:  Cardiovascular;  Laterality: N/A;  . Cardioversion N/A 01/07/2014    Procedure: CARDIOVERSION;  Surgeon: Lelon Perla, MD;  Location: [facility] ENDOSCOPY;  Service: Cardiovascular;  Laterality: N/A;  . Cardioversion N/A 02/11/2014    Procedure: CARDIOVERSION;  Surgeon: Pixie Casino, MD;  Location: [facility] ENDOSCOPY;  Service: Cardiovascular;  Laterality: N/A;  . Tee without cardioversion N/A 02/11/2014    Procedure: TRANSESOPHAGEAL ECHOCARDIOGRAM (TEE);  Surgeon: Pixie Casino, MD;  [facility] ENDOSCOPY;  Service: Cardiovascular;  Laterality: N/A;     Family History  Problem Relation Age of Onset  . Breast cancer Mother   . Multiple myeloma Mother   . Prostate cancer Father   . Diabetes Father   . Cerebral palsy Daughter      Social History   Social History  . Marital Status: Divorced    Spouse Name: N/A  . Number of Children: N/A  . Years of Education: N/A   Occupational History  . Unemployed     basketball referee in the past   Social History Main Topics  . Smoking status: Former Research scientist (life sciences)  . Smokeless tobacco: Not on file  . Alcohol Use: 0.0 oz/week    0 Standard drinks or equivalent per week     Comment: occ  . Drug Use: No     Comment: Reported history of cocaine abuse off and on   . Sexual Activity: Not on file   Other Topics Concern  . Not on file   Social History Narrative   Living with a son who is a 6 year old.  He is not     working, unemployed for 3 years.  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  Denied use of alcohol.  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           BP 123/82 mmHg  Pulse 69  Ht _0  (1.905 m)  Wt 291 lb (131.997 kg)  BMI 36.37 kg/m2  Physical Exam: Diskempt appearing middle-aged man, NAD HEENT: Unremarkable Neck:  No JVD, no thyromegally Back:  No CVA tenderness Lungs:  Clear except for basilar rales HEART:  IRegular rate rhythm, no murmurs, no rubs, no clicks Abd:  soft, positive bowel  sounds, no organomegally, no rebound, no guarding Ext:  2 plus pulses, no edema, no cyanosis, no clubbing Skin:  No rashes no nodules Neuro:  CN II through XII intact, motor grossly intact   DEVICE  Normal device function.  See PaceArt for details.   Assess/Plan:

## 2015-03-20 NOTE — Assessment & Plan Note (Signed)
His symptoms are class 2. He will continue his current meds.  

## 2015-03-20 NOTE — Assessment & Plan Note (Signed)
His medtronic ICD is working normally. Will recheck in several months. 

## 2015-03-20 NOTE — Patient Instructions (Addendum)
  Medication Instructions:  Your physician has recommended you make the following change in your medication:   1) Decrease Amiodarone to 200 mg daily  Labwork: None ordered   Testing/Procedures: None ordered   Follow-Up: Your physician wants you to follow-up in: 3 months in device clinic and12 months with Dr Knox Saliva will receive a reminder letter in the mail two months in advance. If you don't receive a letter, please call our office to schedule the follow-up appointment.       Any Other Special Instructions Will Be Listed Below (If Applicable).     If you need a refill on your cardiac medications before your next appointment, please call your pharmacy.

## 2015-03-20 NOTE — Assessment & Plan Note (Signed)
He is maintaining NSR. He will continue his blood thinner and I have asked that he decrease his dose of amio to 200 mg daily.

## 2015-03-30 ENCOUNTER — Ambulatory Visit: Payer: Medicaid Other | Attending: Family Medicine | Admitting: Physical Therapy

## 2015-03-30 DIAGNOSIS — M2560 Stiffness of unspecified joint, not elsewhere classified: Secondary | ICD-10-CM | POA: Insufficient documentation

## 2015-03-30 DIAGNOSIS — R262 Difficulty in walking, not elsewhere classified: Secondary | ICD-10-CM

## 2015-03-30 DIAGNOSIS — M5441 Lumbago with sciatica, right side: Secondary | ICD-10-CM | POA: Diagnosis present

## 2015-03-30 DIAGNOSIS — M5386 Other specified dorsopathies, lumbar region: Secondary | ICD-10-CM

## 2015-03-30 DIAGNOSIS — R531 Weakness: Secondary | ICD-10-CM

## 2015-03-30 NOTE — Therapy (Signed)
[place], [place], [address] Phone: [phone]   Fax:  [phone]  Physical Therapy Evaluation/ One time Evaluation/Discharge  Patient Details  Name: Cody Ortega MRN: [id number] Date of Birth: Nov 07, 1960 Referring Provider: [name] MD  Encounter Date: 03/30/2015      PT End of Session - 03/30/15 1634    Visit Number 1   Number of Visits 1   Date for PT Re-Evaluation 03/30/15   Authorization Type [place]/ Medicaid [place]   PT Start Time [phone]   PT Stop Time 0520   PT Time Calculation (min) 48 min   Activity Tolerance Patient limited by pain   Behavior During Therapy [facility] for tasks assessed/performed      Past Medical History  Diagnosis Date  . Chronic systolic congestive heart failure (HCC)     Acute decompensation, LVEF less than 20%  . Hypertension   . History of cocaine abuse   . Bronchial asthma   . Benzodiazepine dependence ([place])   . Anxiety   . Depression   . Microcytic anemia   . CKD (chronic kidney disease), stage IV (HCC)     Stage 3-4  . 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 PAF (paroxysmal atrial fibrillation) ([place])     a. Episode in 2008 with spontaneous conversion  . CAD (coronary artery disease)     a. Cath 03/2010: mod RCA stenosis, severe diag stenosis, treated medically given lack of angina.  . Stroke ([employer]) 2004  . S/P implantation of automatic cardioverter/defibrillator (AICD)     a. Medtronic, implanted 2012.  Marland Kitchen Thyroid disease     Past Surgical History  Procedure Laterality Date  . Tonsillectomy    . Transthoracic echocardiogram  10/2006, 12/2009  . Tee without cardioversion N/A 01/07/2014    Procedure: TRANSESOPHAGEAL ECHOCARDIOGRAM (TEE);  Surgeon: Lelon Perla, MD;  Location: [facility] ENDOSCOPY;  Service: Cardiovascular;  Laterality: N/A;  . Cardioversion N/A 01/07/2014    Procedure: CARDIOVERSION;  Surgeon: Lelon Perla, MD;   Location: [facility] ENDOSCOPY;  Service: Cardiovascular;  Laterality: N/A;  . Cardioversion N/A 02/11/2014    Procedure: CARDIOVERSION;  Surgeon: Pixie Casino, MD;  Location: [facility] ENDOSCOPY;  Service: Cardiovascular;  Laterality: N/A;  . Tee without cardioversion N/A 02/11/2014    Procedure: TRANSESOPHAGEAL ECHOCARDIOGRAM (TEE);  Surgeon: Pixie Casino, MD;  Location: [facility] ENDOSCOPY;  Service: Cardiovascular;  Laterality: N/A;    There were no vitals filed for this visit.  Visit Diagnosis:  Generalized weakness  Decreased ROM of lumbar spine  Difficulty walking      Subjective Assessment - 03/30/15 1634    Subjective I  have had two cortisone in my right buttock and then I recieved another in right shoulder.  I still have right sided pain that runs down to my Right ankle. shots will help.  Knees bil 5-6/10 constantly.  I shop and use the cart at grocery stores and I try to go at night because i am embarrassed   Pertinent History originally 1990 herniated left back.  Pt has a PACEmaker.   Limitations Sitting;Standing;Walking   How long can you sit comfortably? 5 min   How long can you stand comfortably? 5 min   How long can you walk comfortably? 20 min   Patient Stated Goals Learn how to exercise and make back feel better   Currently in Pain? Yes   Pain Score 7    Pain Location Back  Pain Orientation Lower;Right   Pain Descriptors / Indicators Spasm;Aching   Pain Type Chronic pain   Pain Radiating Towards into the ankle   Pain Onset More than a month ago   Pain Frequency Constant   Multiple Pain Sites Yes   Pain Score 7   Pain Location Buttocks   Pain Orientation Right   Pain Descriptors / Indicators Aching;Spasm   Pain Type Chronic pain   Pain Onset More than a month ago   Pain Frequency Constant            [facility] PT Assessment - 03/30/15 1642    Assessment   Medical Diagnosis right sided pain in low back with sciatica   Referring Provider [name] MD   Onset  Date/Surgical Date 02/19/15   Hand Dominance Right   Prior Therapy none   Precautions   Precautions None;Fall;ICD/Pacemaker   Restrictions   Weight Bearing Restrictions No   Balance Screen   Has the patient fallen in the past 6 months Yes   How many times? 2  utilizing steps   Has the patient had a decrease in activity level because of a fear of falling?  No   Is the patient reluctant to leave their home because of a fear of falling?  No   Cognition   Overall Cognitive Status Within Functional Limits for tasks assessed   Observation/Other Assessments   Observations Pt enters clinic with lateral trunk lean to left   AROM   Lumbar Flexion 70  b   Lumbar Extension 12  pain with extension   Lumbar - Right Side Bend 14  pain   Lumbar - Left Side Bend 23  relieves pain   Lumbar - Right Rotation 70%   Lumbar - Left Rotation 70%   Strength   Overall Strength Deficits   Overall Strength Comments grossly 4/5 unable to bridge in supine 1/2 way   Right Hip Flexion 4/5   Right Hip Extension 3/5   Right Hip ABduction 4-/5   Left Hip Flexion 4+/5   Left Hip Extension 4/5   Left Hip ABduction 4/5   Flexibility   Soft Tissue Assessment /Muscle Length yes   Hamstrings Right 60 degrees Left 76   Palpation   Palpation comment Pt with tenderness over lumbar mobs over spinous processes.                    [place] Adult PT Treatment/Exercise - 03/30/15 1657    Lumbar Exercises: Supine   Ab Set 5 reps   Bridge 5 reps      Pt performed piriformis stretch for Right side 30 sec in supine and sitting.  Pt also given handout for posture and body mechanics and ADL's   Transversus Abdomins set 10 sec hold x 10, pelvic tilt x 10,  supiine clams 10 x with right and left., bent knee x 10 right and left  Shoulder bridge hold 15 sec x 3 , attempted bridge x 10 with 1/4 clearance , not full ROM  Given handout for basic back exericise to progress after basic core above          PT  Education - 03/30/15 1736    Education provided Yes   Education Details Pt given HEP, information about [facility] bono clinic and Forensic psychologist handout for home use.   Person(s) Educated Patient   Methods Explanation;Demonstration;Tactile cues;Verbal cues;Handout   Comprehension Verbalized understanding;Returned demonstration  PT Short Term Goals - 03/30/15 1738    PT SHORT TERM GOAL #1   Title STG=LTG   Time 1   Period Days   Status Achieved           PT Long Term Goals - 03/30/15 1738    PT LONG TERM GOAL #1   Title Pt will be given information on community resources and HEP and POsture/body mechanicis   Time 1   Period Days   Status Achieved               Plan - 03/30/15 1740    Clinical Impression Statement Pt presents to clinic with Right sided sciatica and difficulty walking / Pt has a cane at home but does not utilize. Pt was instructed to use cane due to pt reported falls when going up and down stepsl x 2 in last 6 months. due to knee weakness. Pt presents with generalized deconditioned body grossly 4-/to 4/5 except Right hip extension 3/5.   Due to  Midicaid limitation recieved information about posutre and body mechancis and an initial HEP to address deficits in strenght , ROM , walking and pain with sciatica.  Pt given initial Core/ Transvesus Abdominis Engagement exericises and  given BAsic back  exericise packet as well as posture and body mechanics.     PT Frequency One time visit   PT Treatment/Interventions Patient/family education;Therapeutic exercise;Passive range of motion;Gait training;Neuromuscular re-education   PT Next Visit Plan DC   PT Home Exercise Plan BASic Core transversus abdominis activation and basic back program.   Consulted and Agree with Plan of Care Patient         Problem List Patient Active Problem List   Diagnosis Date Noted  . Chronic pain syndrome 03/16/2015  . Obstructive sleep apnea 03/16/2015   . Chronic low back pain with right-sided sciatica 03/16/2015  . Sinus brady-tachy syndrome ([place]) 02/20/2014  . Atrial fibrillation ([place]) 12/31/2013  . Automatic implantable cardioverter-defibrillator in situ 03/08/2011  . Erectile dysfunction 02/28/2011  . CAD, NATIVE VESSEL 04/26/2010  . PURE HYPERCHOLESTEROLEMIA 04/12/2010  . HYPERLIPIDEMIA-MIXED 01/11/2010  . Essential hypertension, malignant 12/18/2009  . Chronic systolic heart failure ([place]) 12/18/2009    Voncille Lo, PT 03/30/2015 5:52 PM Phone: [phone] Fax: [facility] [place], [place], [address] Phone: [phone]   Fax:  [phone]  Name: Cody Ortega MRN: [id number] Date of Birth: February 10, 1961    Due to changes in the [facility] Policy for Rehab as of June 1st , 2014-- this patient does not have a qualifying diagnosis that is covered.  The patient is unable topay out of pocket expenses at this time, there fore will not be seen for treatment.  Pt was given information about other community resources.  PHYSICAL THERAPY DISCHARGE SUMMARY  Visits from Start of Care: 1  Current functional level related to goals / functional outcomes: See above  Remaining deficits: As above, unable to  Tolerate standing greater than 5 min and walking greater than 20 min  See above  Education / Equipment: Posture and body mechanics, Initial HEP, Community resources Plan: Patient agrees to discharge.  Patient goals were met. Patient is being discharged due to financial reasons.  ?????        Voncille Lo, PT 03/30/2015 6:13 PM Phone: [phone] Fax: [phone]

## 2015-03-30 NOTE — Patient Instructions (Addendum)
Sleeping on Back  Place pillow under knees. A pillow with cervical support and a roll around waist are also helpful. Copyright  VHI. All rights reserved.  Sleeping on Side Place pillow between knees. Use cervical support under neck and a roll around waist as needed. Copyright  VHI. All rights reserved.   Sleeping on Stomach   If this is the only desirable sleeping position, place pillow under lower legs, and under stomach or chest as needed.  Posture - Sitting   Sit upright, head facing forward. Try using a roll to support lower back. Keep shoulders relaxed, and avoid rounded back. Keep hips level with knees. Avoid crossing legs for long periods. Stand to Sit / Sit to Stand   To sit: Bend knees to lower self onto front edge of chair, then scoot back on seat. To stand: Reverse sequence by placing one foot forward, and scoot to front of seat. Use rocking motion to stand up.   Work Height and Reach  Ideal work height is no more than 2 to 4 inches below elbow level when standing, and at elbow level when sitting. Reaching should be limited to arm's length, with elbows slightly bent.  Bending  Bend at hips and knees, not back. Keep feet shoulder-width apart.    Posture - Standing   Good posture is important. Avoid slouching and forward head thrust. Maintain curve in low back and align ears over shoul- ders, hips over ankles.  Alternating Positions   Alternate tasks and change positions frequently to reduce fatigue and muscle tension. Take rest breaks. Computer Work   Position work to face forward. Use proper work and seat height. Keep shoulders back and down, wrists straight, and elbows at right angles. Use chair that provides full back support. Add footrest and lumbar roll as needed.  Getting Into / Out of Car  Lower self onto seat, scoot back, then bring in one leg at a time. Reverse sequence to get out.  Dressing  Lie on back to pull socks or slacks over feet, or sit  and bend leg while keeping back straight.    Housework - Sink  Place one foot on ledge of cabinet under sink when standing at sink for prolonged periods.   Pushing / Pulling  Pushing is preferable to pulling. Keep back in proper alignment, and use leg muscles to do the work.  Deep Squat   Squat and lift with both arms held against upper trunk. Tighten stomach muscles without holding breath. Use smooth movements to avoid jerking.  Avoid Twisting   Avoid twisting or bending back. Pivot around using foot movements, and bend at knees if needed when reaching for articles.  Carrying Luggage   Distribute weight evenly on both sides. Use a cart whenever possible. Do not twist trunk. Move body as a unit.   Lifting Principles .Maintain proper posture and head alignment. .Slide object as close as possible before lifting. .Move obstacles out of the way. .Test before lifting; ask for help if too heavy. .Tighten stomach muscles without holding breath. .Use smooth movements; do not jerk. .Use legs to do the work, and pivot with feet. .Distribute the work load symmetrically and close to the center of trunk. .Push instead of pull whenever possible.   Ask For Help   Ask for help and delegate to others when possible. Coordinate your movements when lifting together, and maintain the low back curve.  Log Roll   Lying on back, bend left knee and place left   arm across chest. Roll all in one movement to the right. Reverse to roll to the left. Always move as one unit. Housework - Sweeping  Use long-handled equipment to avoid stooping.   Housework - Wiping  Position yourself as close as possible to reach work surface. Avoid straining your back.  Laundry - Unloading Wash   To unload small items at bottom of washer, lift leg opposite to arm being used to reach.  [place] close to area to be raked. Use arm movements to do the work. Keep back straight and avoid  twisting.           Cart  When reaching into cart with one arm, lift opposite leg to keep back straight.   Getting Into / Out of Bed  Lower self to lie down on one side by raising legs and lowering head at the same time. Use arms to assist moving without twisting. Bend both knees to roll onto back if desired. To sit up, start from lying on side, and use same move-ments in reverse. Housework - Vacuuming  Hold the vacuum with arm held at side. Step back and forth to move it, keeping head up. Avoid twisting.   Laundry - IT consultant so that bending and twisting can be avoided.   Laundry - Unloading Dryer  Squat down to reach into clothes dryer or use a reacher.  Gardening - Weeding / Probation officer or Kneel. Knee pads may be helpful.                     PELVIC TILT  Lie on back, legs bent. Exhale, tilting top of pelvis back, pubic bone up, to flatten lower back. Inhale, rolling pelvis opposite way, top forward, pubic bone down, arch in back. Repeat __10__ times. Do __2__ sessions per day. Copyright  VHI. All rights reserved.     Lie with hips and knees bent. Slowly inhale, and then exhale. Pull navel toward spine and tighten pelvic floor. Hold for __10_ seconds. Continue to breathe in and out during hold. Rest for _10__ seconds. Repeat __10_ times. Do __2-3_ times a day.   Copyright  VHI. All rights reserved.  Knee Fold   Lie on back, legs bent, arms by sides. Exhale, lifting knee to chest. Inhale, returning. Keep abdominals flat, navel to spine. Repeat __10__ times, alternating legs. Do __2__ sessions per day.  Copyright  VHI. All rights reserved.  Knee Drop   Keep pelvis stable. Without rotating hips, slowly drop knee to side, pause, return to center, bring knee across midline toward opposite hip. Feel obliques engaging. Repeat for ___10_ times each leg.  Copyright  VHI. All rights reserved.  Isometric Hold With  Pelvic Floor (Hook-Lying)    Copyright  VHI. All rights reserved.  Heel Slide to Straight   Slide one leg down to straight. Return. Be sure pelvis does not rock forward, tilt, rotate, or tip to side. Do _10__ times. Restabilize pelvis. Repeat with other leg. Do __1-2_ sets, __2_ times per day.  Bridge    Lie back, legs bent. Inhale, pressing hips up. Keeping ribs in, lengthen lower back. Exhale, rolling down along spine from top. Repeat __10 x 3__ times. Do _1-2___ sessions per day. Try to lift as best you can.  Initially try to hold for 15 seconds.  X 3 to start  Piriformis (Supine)   Hip Stretch  Put right ankle over left knee. Let  right knee fall downward, but keep ankle in place. Feel the stretch in hip. May push down gently with hand to feel stretch. Hold _30___ seconds while counting out loud. Repeat with other leg. Repeat 2-3____ times. Do _3___ sessions per day.  Stretching: Piriformis   Stretching: Piriformis (Supine)  Pull right knee toward opposite shoulder. Hold 30 ____ seconds. Relax. Repeat _2-3___ times per set. Do _1___ sets per session. Do __3__ sessions per day.       Copyright  VHI. All rights reserved.  Pt given [facility] information and a packet of basic back exercises once you master the above exericses.   Voncille Lo, PT 03/30/2015 5:03 PM Phone: [phone] Fax: [phone]

## 2015-04-05 ENCOUNTER — Other Ambulatory Visit: Payer: Self-pay | Admitting: Cardiovascular Disease

## 2015-04-06 ENCOUNTER — Other Ambulatory Visit: Payer: Self-pay | Admitting: *Deleted

## 2015-04-06 MED ORDER — AMLODIPINE BESYLATE 10 MG PO TABS: 10.0000 mg | ORAL_TABLET | Freq: Every day | ORAL | Status: DC

## 2015-04-06 MED ORDER — HYDRALAZINE HCL 25 MG PO TABS: 12.5000 mg | ORAL_TABLET | Freq: Three times a day (TID) | ORAL | Status: DC

## 2015-04-14 ENCOUNTER — Encounter: Payer: Medicaid Other | Admitting: Pharmacist

## 2015-04-15 ENCOUNTER — Ambulatory Visit: Payer: Medicaid Other | Attending: Family Medicine | Admitting: Pharmacist

## 2015-04-15 VITALS — BP 136/81 | HR 69

## 2015-04-15 DIAGNOSIS — I10 Essential (primary) hypertension: Secondary | ICD-10-CM | POA: Diagnosis not present

## 2015-04-15 DIAGNOSIS — Z79899 Other long term (current) drug therapy: Secondary | ICD-10-CM | POA: Diagnosis not present

## 2015-04-15 DIAGNOSIS — Z0000 Encounter for general adult medical examination without abnormal findings: Secondary | ICD-10-CM

## 2015-04-15 NOTE — Patient Instructions (Addendum)
Thanks for coming to see me!  Keep taking all of your medications as directed  Schedule an appointment to see Dr. Adrian Blackwater in February 2017 (3 months from last visit)

## 2015-04-15 NOTE — Progress Notes (Signed)
S:    Patient arrives in good spirits but reports significant stress.    Presents to the clinic for hypertension evaluation.   Patient reports adherence with medications.  Current BP Medications include:  Amlodipine 10 mg daily, carvedilol 6.25 mg BID, furosemide 40 mg BID PRN, hydralazine 12.5 mg TID   O:   Last 3 Office BP readings: BP Readings from Last 3 Encounters:  04/15/15 136/81  03/20/15 123/82  03/16/15 148/97    BMET    Component Value Date/Time   NA 141 06/13/2014 1123   K 3.4* 06/13/2014 1123   CL 105 06/13/2014 1123   CO2 30 06/13/2014 1123   GLUCOSE 89 06/13/2014 1123   BUN 23 06/13/2014 1123   CREATININE 1.59* 06/13/2014 1123   CALCIUM 9.1 06/13/2014 1123   GFRNONAA 44* 12/29/2013 0519   GFRAA 51* 12/29/2013 0519    A/P: History of hypertension currently controlled on current medications.  No recommendations for any changes. Instructed the patient to continue to take all medication as prescribed and to get refills on time to avoid missing any doses of his medications.   Results reviewed and written information provided.   Total time in face-to-face counseling 10 minutes.  F/U Clinic Visit with Dr. Adrian Blackwater as directed.

## 2015-04-22 ENCOUNTER — Other Ambulatory Visit: Payer: Self-pay | Admitting: Cardiovascular Disease

## 2015-04-22 NOTE — Telephone Encounter (Signed)
Evans Lance, MD at 03/20/2015 3:59 PM  potassium chloride SA (K-DUR,KLOR-CON) 20 MEQ tabletTAKE 2 TABLETS BY MOUTH DAILY  Patient Instructions      Medication Instructions:  Your physician has recommended you make the following change in your medication:   1) Decrease Amiodarone to 200 mg daily

## 2015-05-10 ENCOUNTER — Other Ambulatory Visit: Payer: Self-pay | Admitting: Cardiovascular Disease

## 2015-05-25 ENCOUNTER — Other Ambulatory Visit: Payer: Self-pay | Admitting: Cardiovascular Disease

## 2015-05-28 ENCOUNTER — Encounter: Payer: Medicaid Other | Admitting: Internal Medicine

## 2015-06-24 ENCOUNTER — Other Ambulatory Visit: Payer: Self-pay | Admitting: Cardiovascular Disease

## 2015-06-24 ENCOUNTER — Other Ambulatory Visit: Payer: Self-pay | Admitting: Internal Medicine

## 2015-06-24 NOTE — Telephone Encounter (Signed)
REFILL 

## 2015-06-26 ENCOUNTER — Other Ambulatory Visit: Payer: Self-pay | Admitting: Cardiovascular Disease

## 2015-06-26 ENCOUNTER — Encounter: Payer: Self-pay | Admitting: Internal Medicine

## 2015-06-26 ENCOUNTER — Ambulatory Visit (INDEPENDENT_AMBULATORY_CARE_PROVIDER_SITE_OTHER): Payer: Medicaid Other | Admitting: *Deleted

## 2015-06-26 DIAGNOSIS — I5022 Chronic systolic (congestive) heart failure: Secondary | ICD-10-CM | POA: Diagnosis not present

## 2015-06-26 DIAGNOSIS — I255 Ischemic cardiomyopathy: Secondary | ICD-10-CM

## 2015-06-26 NOTE — Progress Notes (Signed)
ICD check in clinic. Normal device function. Threshold and sensing consistent with previous device measurements. Impedance trends stable over time. No evidence of any ventricular arrhythmias. Histogram distribution appropriate for patient and level of activity. Stable thoracic impedance. No changes made this session. Device programmed at appropriate safety margins. Device programmed to optimize intrinsic conduction. Batt voltage 3.05V (ERI 2.63V). Pt will follow up with the [facility] in 3 months and with [facility] in 03-2016. Patient education completed including shock plan. Alert tones demonstrated for patient.

## 2015-07-01 ENCOUNTER — Telehealth: Payer: Self-pay | Admitting: Family Medicine

## 2015-07-01 DIAGNOSIS — R0602 Shortness of breath: Secondary | ICD-10-CM

## 2015-07-01 NOTE — Telephone Encounter (Signed)
Patient called requesting medication refill request for albuterol. Please follow up.

## 2015-07-02 MED ORDER — ALBUTEROL SULFATE HFA 108 (90 BASE) MCG/ACT IN AERS
2.0000 | INHALATION_SPRAY | Freq: Four times a day (QID) | RESPIRATORY_TRACT | Status: DC | PRN
Start: 2015-07-02 — End: 2015-07-09

## 2015-07-02 NOTE — Telephone Encounter (Signed)
Pt notified  Has F/U appointment with PCP

## 2015-07-02 NOTE — Telephone Encounter (Signed)
Albuterol refilled Please have patient f/u as I need to assess reason for albuterol

## 2015-07-09 ENCOUNTER — Ambulatory Visit (HOSPITAL_COMMUNITY)
Admission: RE | Admit: 2015-07-09 | Discharge: 2015-07-09 | Disposition: A | Discharge status: Home / Self Care | Payer: Medicaid Other | Source: Ambulatory Visit | Attending: Family Medicine | Admitting: Family Medicine

## 2015-07-09 ENCOUNTER — Encounter: Payer: Self-pay | Admitting: Family Medicine

## 2015-07-09 ENCOUNTER — Ambulatory Visit (HOSPITAL_BASED_OUTPATIENT_CLINIC_OR_DEPARTMENT_OTHER): Payer: Medicaid Other | Admitting: Family Medicine

## 2015-07-09 VITALS — BP 124/82 | HR 68 | Temp 98.2°F | Resp 16 | Ht 75.0 in | Wt 297.0 lb

## 2015-07-09 DIAGNOSIS — Z0000 Encounter for general adult medical examination without abnormal findings: Secondary | ICD-10-CM | POA: Diagnosis not present

## 2015-07-09 DIAGNOSIS — Z95810 Presence of automatic (implantable) cardiac defibrillator: Secondary | ICD-10-CM | POA: Insufficient documentation

## 2015-07-09 DIAGNOSIS — J209 Acute bronchitis, unspecified: Secondary | ICD-10-CM | POA: Diagnosis not present

## 2015-07-09 DIAGNOSIS — R05 Cough: Secondary | ICD-10-CM | POA: Diagnosis present

## 2015-07-09 DIAGNOSIS — I517 Cardiomegaly: Secondary | ICD-10-CM | POA: Diagnosis not present

## 2015-07-09 DIAGNOSIS — R0602 Shortness of breath: Secondary | ICD-10-CM

## 2015-07-09 DIAGNOSIS — I10 Essential (primary) hypertension: Secondary | ICD-10-CM | POA: Diagnosis not present

## 2015-07-09 LAB — POCT GLYCOSYLATED HEMOGLOBIN (HGB A1C)

## 2015-07-09 MED ORDER — DOXYCYCLINE HYCLATE 100 MG PO TABS: 100.0000 mg | ORAL_TABLET | Freq: Two times a day (BID) | ORAL | Status: DC

## 2015-07-09 MED ORDER — ALBUTEROL SULFATE HFA 108 (90 BASE) MCG/ACT IN AERS: 2.0000 | INHALATION_SPRAY | Freq: Four times a day (QID) | RESPIRATORY_TRACT | Status: DC | PRN

## 2015-07-09 NOTE — Assessment & Plan Note (Signed)
Acute bronchitis in CHF patient Doxycyline x 10 days CXR

## 2015-07-09 NOTE — Patient Instructions (Signed)
Cody Ortega was seen today for uri.  Diagnoses and all orders for this visit:  Healthcare maintenance -     HgB A1c  SOB (shortness of breath) -     albuterol (VENTOLIN HFA) 108 (90 Base) MCG/ACT inhaler; Inhale 2 puffs into the lungs every 6 (six) hours as needed for wheezing or shortness of breath.  Acute bronchitis, unspecified organism -     DG Chest 2 View; Future -     doxycycline (VIBRA-TABS) 100 MG tablet; Take 1 tablet (100 mg total) by mouth 2 (two) times daily.   You will be contacted with CXR results  F/u in 3 months for HTN  Dr. Adrian Blackwater

## 2015-07-09 NOTE — Assessment & Plan Note (Signed)
A; HTN well controlled P: Continue current regimen

## 2015-07-09 NOTE — Progress Notes (Signed)
Chest congestion, x 4 weeks Productive cough-yellow spurium- cough worst in the morning Pain scale #3 -back and knee  No tobacco user  No suicidal thoughts in the past two weeks

## 2015-07-09 NOTE — Progress Notes (Signed)
Subjective:  Patient ID: Cody Ortega, male    DOB: 12/15/1960  Age: 55 y.o. MRN: [id number]  CC: URI   HPI Cody Ortega presents for    1. Cough: x 4 weeks. Cough with congestion. Cough is productive. No fever or chills. No CP. There is mild sore throat and SOB with exertion.   2. CHRONIC HYPERTENSION  Disease Monitoring  Blood pressure range: not checking   Chest pain: no   Dyspnea: yes   Claudication: no   Medication compliance: yes  Medication Side Effects  Lightheadedness: no   Urinary frequency: no   Edema: no   Impotence: no   Social History  Substance Use Topics  . Smoking status: Former Research scientist (life sciences)  . Smokeless tobacco: Not on file  . Alcohol Use: 0.0 oz/week    0 Standard drinks or equivalent per week     Comment: occ    Outpatient Prescriptions Prior to Visit  Medication Sig Dispense Refill  . albuterol (VENTOLIN HFA) 108 (90 Base) MCG/ACT inhaler Inhale 2 puffs into the lungs every 6 (six) hours as needed for wheezing or shortness of breath. 8 g 2  . alprazolam (XANAX) 2 MG tablet Take 1-2 mg by mouth 4 (four) times daily as needed for anxiety.     Marland Kitchen amiodarone (PACERONE) 200 MG tablet Take 1 tablet (200 mg total) by mouth daily. 90 tablet 2  . amLODipine (NORVASC) 10 MG tablet TAKE 1 TABLET BY MOUTH EVERY DAY 30 tablet 3  . aspirin 81 MG EC tablet Take 81 mg by mouth daily.      . carvedilol (COREG) 6.25 MG tablet TAKE 1 TABLET BY MOUTH TWICE DAILY WITH A MEAL 180 tablet 1  . cycloSPORINE (RESTASIS) 0.05 % ophthalmic emulsion Place 1 drop into both eyes 2 (two) times daily.    . DULoxetine (CYMBALTA) 60 MG capsule Take 60 mg by mouth daily.    . ferrous sulfate 325 (65 FE) MG tablet Take 325 mg by mouth 2 (two) times daily with a meal. Reported on 06/26/2015    . furosemide (LASIX) 40 MG tablet TAKE 1 TABLET BY MOUTH TWICE DAILY AS NEEDED FOR WEIGHT OVER 300 LBS 60 tablet 9  . hydrALAZINE (APRESOLINE) 25 MG tablet TAKE 1/2 TABLET BY MOUTH THREE TIMES  DAILY 45 tablet 1  . Oxcarbazepine (TRILEPTAL) 300 MG tablet Take 900 mg by mouth at bedtime.    . potassium chloride SA (K-DUR,KLOR-CON) 20 MEQ tablet TAKE 2 TABLETS BY MOUTH DAILY 180 tablet 0  . pravastatin (PRAVACHOL) 80 MG tablet TAKE 1 TABLET BY MOUTH EVERY NIGHT AT BEDTIME 30 tablet 3  . traZODone (DESYREL) 50 MG tablet Take 50 mg by mouth at bedtime as needed for sleep.     Alveda Reasons 20 MG TABS tablet TAKE 1 TABLET BY MOUTH EVERY DAY WITH SUPPER 30 tablet 9  . sildenafil (VIAGRA) 100 MG tablet Take 50 mg by mouth daily as needed for erectile dysfunction. Reported on 07/09/2015    . cyclobenzaprine (FLEXERIL) 10 MG tablet Take 1 tablet (10 mg total) by mouth 3 (three) times daily as needed for muscle spasms. 30 tablet 0   No facility-administered medications prior to visit.    ROS Review of Systems  Constitutional: Negative for fever, chills, fatigue and unexpected weight change.  HENT: Positive for congestion and sore throat.   Eyes: Negative for visual disturbance.  Respiratory: Positive for cough. Negative for shortness of breath.   Cardiovascular: Negative for chest pain,  palpitations and leg swelling.  Gastrointestinal: Negative for nausea, vomiting, abdominal pain, diarrhea, constipation and blood in stool.  Endocrine: Negative for polydipsia, polyphagia and polyuria.  Musculoskeletal: Negative for myalgias, back pain, arthralgias, gait problem and neck pain.  Skin: Negative for rash.  Allergic/Immunologic: Negative for immunocompromised state.  Hematological: Negative for adenopathy. Does not bruise/bleed easily.  Psychiatric/Behavioral: Negative for suicidal ideas, sleep disturbance and dysphoric mood. The patient is not nervous/anxious.     Objective:  BP 124/82 mmHg  Pulse 68  Temp(Src) 98.2 F (36.8 C) (Oral)  Resp 16  Ht 6\' 3"  (1.905 m)  Wt 297 lb (134.718 kg)  BMI 37.12 kg/m2  SpO2 97%  BP/Weight 07/09/2015 04/15/2015 123456  Systolic BP [id number]    Diastolic BP 82 81 82  Wt. (Lbs) 297 - 291  BMI 37.12 - 36.37    Physical Exam  Constitutional: He appears well-developed and well-nourished. No distress.  HENT:  Head: Normocephalic and atraumatic.  Right Ear: Tympanic membrane, external ear and ear canal normal.  Left Ear: Tympanic membrane, external ear and ear canal normal.  Nose: Nose normal.  Mouth/Throat: Posterior oropharyngeal erythema present. No oropharyngeal exudate, posterior oropharyngeal edema or tonsillar abscesses.  Eyes: Conjunctivae and EOM are normal. Pupils are equal, round, and reactive to light. Right eye exhibits no discharge. Left eye exhibits no discharge.  Neck: Normal range of motion. Neck supple.  Cardiovascular: Normal rate, regular rhythm, normal heart sounds and intact distal pulses.   Pulmonary/Chest: Effort normal and breath sounds normal.  Musculoskeletal: He exhibits no edema.  Neurological: He is alert.  Skin: Skin is warm and dry. No rash noted. No erythema.  Psychiatric: He has a normal mood and affect.   Lab Results  Component Value Date   HGBA1C 5 5 07/09/2015     Assessment & Plan:  Cody Ortega was seen today for uri.  Diagnoses and all orders for this visit:  Healthcare maintenance -     HgB A1c  SOB (shortness of breath) -     albuterol (VENTOLIN HFA) 108 (90 Base) MCG/ACT inhaler; Inhale 2 puffs into the lungs every 6 (six) hours as needed for wheezing or shortness of breath.  Acute bronchitis, unspecified organism -     DG Chest 2 View; Future -     doxycycline (VIBRA-TABS) 100 MG tablet; Take 1 tablet (100 mg total) by mouth 2 (two) times daily.  Essential hypertension, malignant   Follow-up: No Follow-up on file.   [name] MD

## 2015-07-10 ENCOUNTER — Encounter: Payer: Self-pay | Admitting: Clinical

## 2015-07-10 ENCOUNTER — Telehealth: Payer: Self-pay | Admitting: *Deleted

## 2015-07-10 NOTE — Telephone Encounter (Signed)
Pt. Returned call. Please f/u °

## 2015-07-10 NOTE — Progress Notes (Signed)
Depression screen [facility] 2/9 07/09/2015 03/16/2015  Decreased Interest 1 0  Down, Depressed, Hopeless 0 0  PHQ - 2 Score 1 0  Altered sleeping 2 -  Tired, decreased energy 3 -  Change in appetite 1 -  Feeling bad or failure about yourself  0 -  Trouble concentrating 0 -  Moving slowly or fidgety/restless 0 -  Suicidal thoughts 0 -  PHQ-9 Score 7 -    GAD 7 : Generalized Anxiety Score 07/09/2015  Nervous, Anxious, on Edge 3  Control/stop worrying 1  Worry too much - different things 2  Trouble relaxing 2  Restless 1  Easily annoyed or irritable 0  Afraid - awful might happen 0  Total GAD 7 Score 9

## 2015-07-10 NOTE — Telephone Encounter (Signed)
-----   Message from [name], MD sent at 07/09/2015  6:11 PM EST ----- Stable heart enlargement in setting of known CHF No fluid in lungs or pneumonia

## 2015-07-10 NOTE — Telephone Encounter (Signed)
LVM to return call.

## 2015-07-14 NOTE — Telephone Encounter (Signed)
LVM to return call x2  

## 2015-07-14 NOTE — Telephone Encounter (Signed)
Pt. Returned call. Please f/u °

## 2015-07-15 ENCOUNTER — Other Ambulatory Visit: Payer: Self-pay | Admitting: Cardiovascular Disease

## 2015-07-28 ENCOUNTER — Other Ambulatory Visit: Payer: Self-pay | Admitting: Cardiovascular Disease

## 2015-08-16 ENCOUNTER — Other Ambulatory Visit: Payer: Self-pay | Admitting: Cardiovascular Disease

## 2015-08-19 ENCOUNTER — Other Ambulatory Visit: Payer: Self-pay | Admitting: Cardiovascular Disease

## 2015-10-01 ENCOUNTER — Encounter: Payer: Self-pay | Admitting: Internal Medicine

## 2015-10-01 ENCOUNTER — Ambulatory Visit (INDEPENDENT_AMBULATORY_CARE_PROVIDER_SITE_OTHER): Payer: Medicaid Other | Admitting: *Deleted

## 2015-10-01 DIAGNOSIS — I429 Cardiomyopathy, unspecified: Secondary | ICD-10-CM | POA: Diagnosis not present

## 2015-10-01 DIAGNOSIS — I5022 Chronic systolic (congestive) heart failure: Secondary | ICD-10-CM

## 2015-10-01 LAB — CUP PACEART INCLINIC DEVICE CHECK
Brady Statistic RV Percent Paced: 0.01 %
HIGH POWER IMPEDANCE MEASURED VALUE: 323 Ohm
HIGH POWER IMPEDANCE MEASURED VALUE: 47 Ohm
HighPow Impedance: 57 Ohm
Implantable Lead Location: 753860
Implantable Lead Serial Number: 345870
Lead Channel Impedance Value: 323 Ohm
Lead Channel Pacing Threshold Pulse Width: 0.4 ms
Lead Channel Sensing Intrinsic Amplitude: 10.125 mV
Lead Channel Setting Pacing Amplitude: 2.5 V
Lead Channel Setting Sensing Sensitivity: 0.3 mV
MDC IDC LEAD IMPLANT DT: 20120806
MDC IDC LEAD MODEL: 185
MDC IDC MSMT BATTERY VOLTAGE: 3.07 V
MDC IDC MSMT LEADCHNL RV PACING THRESHOLD AMPLITUDE: 1.25 V
MDC IDC SESS DTM: 20170601155256
MDC IDC SET LEADCHNL RV PACING PULSEWIDTH: 0.4 ms

## 2015-10-01 NOTE — Progress Notes (Signed)
ICD check in clinic. Normal device function. Thresholds and sensing consistent with previous device measurements. Impedance trends stable over time. No evidence of any ventricular arrhythmias.  Histogram distribution appropriate for patient and level of activity. Alert tone time changed to 1400 per patient sleep/wake cycles. Device programmed at appropriate safety margins. Device programmed to optimize intrinsic conduction. Battery voltage at 3.07V. Optivol stable. Pt enrolled in remote follow-up. Device clinic check in 63mo. Pt. verbalized awareness of alert tones and shock plan.

## 2015-10-08 LAB — CUP PACEART INCLINIC DEVICE CHECK
Battery Voltage: 3.05 V
Brady Statistic RV Percent Paced: 0.01 %
Date Time Interrogation Session: 20170224210446
HIGH POWER IMPEDANCE MEASURED VALUE: 54 Ohm
HIGH POWER IMPEDANCE MEASURED VALUE: 71 Ohm
HighPow Impedance: 342 Ohm
Implantable Lead Location: 753860
Lead Channel Pacing Threshold Pulse Width: 0.4 ms
Lead Channel Setting Sensing Sensitivity: 0.3 mV
MDC IDC LEAD IMPLANT DT: 20120806
MDC IDC LEAD MODEL: 185
MDC IDC LEAD SERIAL: 345870
MDC IDC MSMT LEADCHNL RV IMPEDANCE VALUE: 342 Ohm
MDC IDC MSMT LEADCHNL RV PACING THRESHOLD AMPLITUDE: 1.125 V
MDC IDC MSMT LEADCHNL RV SENSING INTR AMPL: 11.125 mV
MDC IDC MSMT LEADCHNL RV SENSING INTR AMPL: 9.375 mV
MDC IDC SET LEADCHNL RV PACING AMPLITUDE: 2.5 V
MDC IDC SET LEADCHNL RV PACING PULSEWIDTH: 0.4 ms

## 2015-10-09 ENCOUNTER — Ambulatory Visit (INDEPENDENT_AMBULATORY_CARE_PROVIDER_SITE_OTHER): Payer: Medicaid Other | Admitting: Cardiovascular Disease

## 2015-10-09 ENCOUNTER — Encounter: Payer: Self-pay | Admitting: Cardiovascular Disease

## 2015-10-09 VITALS — BP 130/84 | HR 61 | Ht 75.0 in | Wt 299.8 lb

## 2015-10-09 DIAGNOSIS — R0602 Shortness of breath: Secondary | ICD-10-CM | POA: Diagnosis not present

## 2015-10-09 DIAGNOSIS — R059 Cough, unspecified: Secondary | ICD-10-CM

## 2015-10-09 DIAGNOSIS — I5022 Chronic systolic (congestive) heart failure: Secondary | ICD-10-CM | POA: Diagnosis not present

## 2015-10-09 DIAGNOSIS — E785 Hyperlipidemia, unspecified: Secondary | ICD-10-CM

## 2015-10-09 DIAGNOSIS — R05 Cough: Secondary | ICD-10-CM | POA: Diagnosis not present

## 2015-10-09 MED ORDER — AMIODARONE HCL 200 MG PO TABS: 200.0000 mg | ORAL_TABLET | Freq: Every day | ORAL | Status: DC

## 2015-10-09 NOTE — Patient Instructions (Addendum)
Medication Instructions:  Your physician has recommended you make the following change in your medication:  1. DECREASE Amiodarone to 200mg  take one tablet by mouth daily  Please try to use Trazadone sparingly.   Labwork: Your physician recommends that you return for a FASTING LIPID, CMP, BNP, Magnesium, TSH and Free T4--nothing to eat or drink after midnight, lab opens at 7:30 AM  Testing/Procedures: A chest x-ray takes a picture of the organs and structures inside the chest, including the heart, lungs, and blood vessels. This test can show several things, including, whether the heart is enlarges; whether fluid is building up in the lungs; and whether pacemaker / defibrillator leads are still in place. ([place])  Your physician has requested that you have an echocardiogram. Echocardiography is a painless test that uses sound waves to create images of your heart. It provides your doctor with information about the size and shape of your heart and how well your heart's chambers and valves are working. This procedure takes approximately one hour. There are no restrictions for this procedure.  Follow-Up: Your physician recommends that you schedule a follow-up appointment in: 3 MONTHS with PA/NP (pt will need EKG)  Your physician wants you to follow-up in: 6 MONTHS with Dr Burt Knack.  You will receive a reminder letter in the mail two months in advance. If you don't receive a letter, please call our office to schedule the follow-up appointment.   Any Other Special Instructions Will Be Listed Below (If Applicable).     If you need a refill on your cardiac medications before your next appointment, please call your pharmacy.

## 2015-10-09 NOTE — Progress Notes (Signed)
Cardiology Office Note Date:  10/09/2015   ID:  SAWYER MENTZER, DOB 1961-02-26, MRN [id number]  PCP:  Minerva Ends, MD  Cardiologist:  [name], MD    Chief Complaint  Patient presents with  . Shortness of Breath    History of Present Illness: Cody Ortega is a 55 y.o. male who presents for follow-up of chronic systolic heart failure. The patient has a history of severe nonischemic cardiomyopathy. Other comorbidities include chronic kidney disease, coronary artery disease, and history of polysubstance abuse. The patient has been drug free for several years. He was hospitalized in 2015 with atrial fibrillation with RVR. He ultimately underwent cardioversion after treatment with amiodarone was initiated.  States he's doing 'fair.' Complains of not having much energy. Feels like he's more short of breath with activity. States he's coughing up 'foam' at times. This has happened twice in the past month. He sleeps on 1-2 pillows, no real orthopnea. Also has night sweats. No chest pain or pressure. Denies edema in abdomen or legs.   Past Medical History  Diagnosis Date  . Chronic systolic congestive heart failure (HCC)     Acute decompensation, LVEF less than 20%  . Hypertension   . History of cocaine abuse   . Bronchial asthma   . Benzodiazepine dependence ([place])   . Anxiety   . Depression   . Microcytic anemia   . CKD (chronic kidney disease), stage IV (HCC)     Stage 3-4  . 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 PAF (paroxysmal atrial fibrillation) ([place])     a. Episode in 2008 with spontaneous conversion  . CAD (coronary artery disease)     a. Cath 03/2010: mod RCA stenosis, severe diag stenosis, treated medically given lack of angina.  . Stroke ([place]) 2004  . S/P implantation of automatic cardioverter/defibrillator (AICD)     a. Medtronic, implanted 2012.  Marland Kitchen Thyroid disease     Past Surgical History  Procedure Laterality Date  . Tonsillectomy    .  Transthoracic echocardiogram  10/2006, 12/2009  . Tee without cardioversion N/A 01/07/2014    Procedure: TRANSESOPHAGEAL ECHOCARDIOGRAM (TEE);  Surgeon: Lelon Perla, MD;  Location: [facility] ENDOSCOPY;  Service: Cardiovascular;  Laterality: N/A;  . Cardioversion N/A 01/07/2014    Procedure: CARDIOVERSION;  Surgeon: Lelon Perla, MD;  Location: [facility] ENDOSCOPY;  Service: Cardiovascular;  Laterality: N/A;  . Cardioversion N/A 02/11/2014    Procedure: CARDIOVERSION;  Surgeon: Pixie Casino, MD;  Location: [facility] ENDOSCOPY;  Service: Cardiovascular;  Laterality: N/A;  . Tee without cardioversion N/A 02/11/2014    Procedure: TRANSESOPHAGEAL ECHOCARDIOGRAM (TEE);  Surgeon: Pixie Casino, MD;  [facility] ENDOSCOPY;  Service: Cardiovascular;  Laterality: N/A;    Current Outpatient Prescriptions  Medication Sig Dispense Refill  . albuterol (VENTOLIN HFA) 108 (90 Base) MCG/ACT inhaler Inhale 2 puffs into the lungs every 6 (six) hours as needed for wheezing or shortness of breath. 8 g 11  . alprazolam (XANAX) 2 MG tablet Take 1-2 mg by mouth 4 (four) times daily as needed for anxiety.     Marland Kitchen amiodarone (PACERONE) 200 MG tablet Take 1 tablet (200 mg total) by mouth daily. 30 tablet 6  . amLODipine (NORVASC) 10 MG tablet TAKE 1 TABLET BY MOUTH EVERY DAY 30 tablet 3  . aspirin 81 MG EC tablet Take 81 mg by mouth daily.      . carvedilol (COREG) 6.25 MG tablet TAKE 1 TABLET BY MOUTH TWICE  DAILY WITH A MEAL 180 tablet 1  . cycloSPORINE (RESTASIS) 0.05 % ophthalmic emulsion Place 1 drop into both eyes 2 (two) times daily.    . DULoxetine (CYMBALTA) 60 MG capsule Take 60 mg by mouth daily.    . ferrous sulfate 325 (65 FE) MG tablet Take 325 mg by mouth 2 (two) times daily with a meal. Reported on 06/26/2015    . furosemide (LASIX) 40 MG tablet TAKE 1 TABLET BY MOUTH TWICE DAILY AS NEEDED FOR WEIGHT OVER 300 LBS 60 tablet 1  . hydrALAZINE (APRESOLINE) 25 MG tablet TAKE 1/2 TABLET BY MOUTH THREE TIMES DAILY 45 tablet  2  . Lifitegrast (XIIDRA) 5 % SOLN Apply 1 drop to eye 2 (two) times daily.    . mometasone (NASONEX) 50 MCG/ACT nasal spray Place 2 sprays into the nose daily.    . Oxcarbazepine (TRILEPTAL) 300 MG tablet Take 900 mg by mouth at bedtime.    . potassium chloride SA (K-DUR,KLOR-CON) 20 MEQ tablet TAKE 2 TABLETS BY MOUTH DAILY 180 tablet 0  . pravastatin (PRAVACHOL) 80 MG tablet TAKE 1 TABLET BY MOUTH EVERY NIGHT AT BEDTIME 30 tablet 3  . sildenafil (VIAGRA) 100 MG tablet Take 50 mg by mouth daily as needed for erectile dysfunction. Reported on 07/09/2015    . traZODone (DESYREL) 50 MG tablet Take 50 mg by mouth at bedtime as needed for sleep.     Alveda Reasons 20 MG TABS tablet TAKE 1 TABLET BY MOUTH EVERY DAY WITH DINNER 30 tablet 6   No current facility-administered medications for this visit.   Allergies:   Codeine and Hydrocodone-acetaminophen   Social History:  The patient  reports that he has quit smoking. He does not have any smokeless tobacco history on file. He reports that he drinks alcohol. He reports that he does not use illicit drugs.   Family History:  The patient's  family history includes Breast cancer in his mother; Cerebral palsy in his daughter; Diabetes in his father; Multiple myeloma in his mother; Prostate cancer in his father.   ROS:  Please see the history of present illness.  Otherwise, review of systems is positive for chills, blood in stool, back pain, muscle pain, dizziness, fatigue, excessive sweating, snoring, wheezing, nausea, anxiety, difficulty urinating, balance problems.  All other systems are reviewed and negative.    PHYSICAL EXAM: VS:  BP 130/84 mmHg  Pulse 61  Ht '6\' 3"'$  (1.905 m)  Wt 299 lb 12.8 oz (135.988 kg)  BMI 37.47 kg/m2 , BMI Body mass index is 37.47 kg/(m^2). GEN: Well nourished, well developed, in no acute distress HEENT: normal Neck: no JVD, no masses. No carotid bruits Cardiac: RRR without murmur or gallop                Respiratory:  clear to  auscultation bilaterally, normal work of breathing GI: soft, nontender, nondistended, + BS MS: no deformity or atrophy Ext: no pretibial edema Skin: warm and dry, no rash Neuro:  Strength and sensation are intact Psych: euthymic mood, full affect  EKG:  EKG is ordered today. The ekg ordered today shows NSR 61 bpm, prolonged QT, otherwise within normal limits. QTc 503 ms  Recent Labs: No results found for requested labs within last 365 days.   Lipid Panel     Component Value Date/Time   CHOL 101 01/09/2014 1213   TRIG 81.0 01/09/2014 1213   HDL 27.40* 01/09/2014 1213   CHOLHDL 4 01/09/2014 1213   VLDL 16.2 01/09/2014  1213   LDLCALC 57 01/09/2014 1213      Wt Readings from Last 3 Encounters:  10/09/15 299 lb 12.8 oz (135.988 kg)  07/09/15 297 lb (134.718 kg)  03/20/15 291 lb (131.997 kg)    Cardiac Studies Reviewed: 2D Echo 06-13-2014: Study Conclusions  - Left ventricle: The cavity size was severely dilated. Wall thickness was increased in a pattern of moderate LVH. Systolic function was moderately to severely reduced. The estimated ejection fraction was in the range of 30% to 35%. Diffuse hypokinesis. Doppler parameters are consistent with abnormal left ventricular relaxation (grade 1 diastolic dysfunction). - Left atrium: The atrium was moderately dilated. - Right atrium: The atrium was moderately dilated.  ASSESSMENT AND PLAN: 1.  Acute on chronic systolic heart failure, NYHA functional class III: The patient does not appear grossly volume overloaded on exam. In fact, he has no edema or JVD. However, his symptoms of cough and progressive shortness of breath or concerning. Will check a chest x-ray, BNP, 2-D echocardiogram, and metabolic panel.  2. Coronary artery disease, native vessel, without angina: The patient is stable on current medical therapy.  3. Chronic kidney disease stage III: Will update labs.  4. Shortness of breath: Differential diagnosis  includes heart failure versus amiodarone toxicity as potential etiologies. We'll check a chest x-ray and labs as outlined.  5. 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  6. Paroxysmal atrial fibrillation: Patient is maintaining sinus rhythm on amiodarone. He is anticoagulated with Xarelto.  7. Hyperlipidemia: Treated with pravastatin. Will update lipids. Last lipids from one year ago reviewed.  8. Prolonged QT. Amiodarone decreased to 200 mg daily today. Advised to avoid trazadone. Will repeat EKG in 3 months. Check potassium and magnesium.  Current medicines are reviewed with the patient today.  The patient does not have concerns regarding medicines.  Labs/ tests ordered today include:   Orders Placed This Encounter  Procedures  . DG Chest 2 View  . B Nat Peptide  . Comp Met (CMET)  . Lipid panel  . TSH  . T4, free  . Magnesium  . EKG 12-Lead  . ECHOCARDIOGRAM COMPLETE   Disposition:   FU 3 months APP, 6 months with me  Signed, [name], MD  10/09/2015 5:51 PM    [facility] [address], [place], [place]  [address] Phone: [phone]; Fax: [phone]

## 2015-10-12 ENCOUNTER — Other Ambulatory Visit (INDEPENDENT_AMBULATORY_CARE_PROVIDER_SITE_OTHER): Payer: Medicaid Other | Admitting: *Deleted

## 2015-10-12 ENCOUNTER — Ambulatory Visit
Admission: RE | Admit: 2015-10-12 | Discharge: 2015-10-12 | Disposition: A | Discharge status: Home / Self Care | Payer: Medicaid Other | Source: Ambulatory Visit | Attending: Cardiovascular Disease | Admitting: Cardiovascular Disease

## 2015-10-12 DIAGNOSIS — R0602 Shortness of breath: Secondary | ICD-10-CM

## 2015-10-12 DIAGNOSIS — R059 Cough, unspecified: Secondary | ICD-10-CM

## 2015-10-12 DIAGNOSIS — R05 Cough: Secondary | ICD-10-CM

## 2015-10-12 DIAGNOSIS — E785 Hyperlipidemia, unspecified: Secondary | ICD-10-CM

## 2015-10-12 DIAGNOSIS — I5022 Chronic systolic (congestive) heart failure: Secondary | ICD-10-CM

## 2015-10-12 LAB — COMPREHENSIVE METABOLIC PANEL
ALBUMIN: 4 g/dL (ref 3.6–5.1)
ALK PHOS: 79 U/L (ref 40–115)
ALT: 98 U/L — AB (ref 9–46)
AST: 68 U/L — AB (ref 10–35)
BILIRUBIN TOTAL: 0.5 mg/dL (ref 0.2–1.2)
BUN: 20 mg/dL (ref 7–25)
CALCIUM: 9 mg/dL (ref 8.6–10.3)
CO2: 28 mmol/L (ref 20–31)
Chloride: 102 mmol/L (ref 98–110)
Creat: 1.69 mg/dL — ABNORMAL HIGH (ref 0.70–1.33)
GLUCOSE: 90 mg/dL (ref 65–99)
POTASSIUM: 3.4 mmol/L — AB (ref 3.5–5.3)
Sodium: 141 mmol/L (ref 135–146)
Total Protein: 6.9 g/dL (ref 6.1–8.1)

## 2015-10-12 LAB — MAGNESIUM: Magnesium: 1.9 mg/dL (ref 1.5–2.5)

## 2015-10-12 LAB — BRAIN NATRIURETIC PEPTIDE: Brain Natriuretic Peptide: 51 pg/mL (ref <100)

## 2015-10-12 LAB — LIPID PANEL
CHOL/HDL RATIO: 3.9 ratio (ref <=5.0)
Cholesterol: 165 mg/dL (ref 125–200)
HDL: 42 mg/dL (ref >=40)
LDL Cholesterol: 96 mg/dL (ref <130)
TRIGLYCERIDES: 135 mg/dL (ref <150)
VLDL: 27 mg/dL (ref <30)

## 2015-10-12 LAB — T4, FREE: Free T4: 0.8 ng/dL (ref 0.8–1.8)

## 2015-10-12 LAB — TSH: TSH: 2.49 mIU/L (ref 0.40–4.50)

## 2015-10-15 ENCOUNTER — Other Ambulatory Visit: Payer: Self-pay

## 2015-10-15 DIAGNOSIS — R748 Abnormal levels of other serum enzymes: Secondary | ICD-10-CM

## 2015-10-15 MED ORDER — PRAVASTATIN SODIUM 40 MG PO TABS: 40.0000 mg | ORAL_TABLET | Freq: Every day | ORAL | Status: DC

## 2015-10-20 ENCOUNTER — Other Ambulatory Visit: Payer: Self-pay

## 2015-10-20 ENCOUNTER — Other Ambulatory Visit: Payer: Self-pay | Admitting: Cardiovascular Disease

## 2015-10-20 MED ORDER — HYDRALAZINE HCL 25 MG PO TABS: 12.5000 mg | ORAL_TABLET | Freq: Three times a day (TID) | ORAL | Status: DC

## 2015-10-21 ENCOUNTER — Other Ambulatory Visit: Payer: Self-pay | Admitting: Cardiovascular Disease

## 2015-10-28 ENCOUNTER — Ambulatory Visit (HOSPITAL_COMMUNITY): Payer: Medicaid Other | Attending: Internal Medicine

## 2015-10-28 ENCOUNTER — Other Ambulatory Visit: Payer: Self-pay

## 2015-10-28 DIAGNOSIS — E785 Hyperlipidemia, unspecified: Secondary | ICD-10-CM | POA: Diagnosis not present

## 2015-10-28 DIAGNOSIS — I77810 Thoracic aortic ectasia: Secondary | ICD-10-CM | POA: Insufficient documentation

## 2015-10-28 DIAGNOSIS — I059 Rheumatic mitral valve disease, unspecified: Secondary | ICD-10-CM | POA: Diagnosis not present

## 2015-10-28 DIAGNOSIS — I5022 Chronic systolic (congestive) heart failure: Secondary | ICD-10-CM | POA: Diagnosis not present

## 2015-10-28 DIAGNOSIS — I2510 Atherosclerotic heart disease of native coronary artery without angina pectoris: Secondary | ICD-10-CM | POA: Insufficient documentation

## 2015-10-28 DIAGNOSIS — R05 Cough: Secondary | ICD-10-CM | POA: Insufficient documentation

## 2015-10-28 DIAGNOSIS — I429 Cardiomyopathy, unspecified: Secondary | ICD-10-CM | POA: Diagnosis not present

## 2015-10-28 DIAGNOSIS — R0600 Dyspnea, unspecified: Secondary | ICD-10-CM | POA: Diagnosis present

## 2015-10-28 DIAGNOSIS — R059 Cough, unspecified: Secondary | ICD-10-CM

## 2015-10-28 DIAGNOSIS — N189 Chronic kidney disease, unspecified: Secondary | ICD-10-CM | POA: Insufficient documentation

## 2015-10-28 DIAGNOSIS — I130 Hypertensive heart and chronic kidney disease with heart failure and stage 1 through stage 4 chronic kidney disease, or unspecified chronic kidney disease: Secondary | ICD-10-CM | POA: Diagnosis not present

## 2015-10-28 DIAGNOSIS — R0602 Shortness of breath: Secondary | ICD-10-CM | POA: Diagnosis not present

## 2015-10-30 ENCOUNTER — Other Ambulatory Visit: Payer: Self-pay | Admitting: Cardiovascular Disease

## 2015-11-09 ENCOUNTER — Other Ambulatory Visit (INDEPENDENT_AMBULATORY_CARE_PROVIDER_SITE_OTHER): Payer: Medicaid Other | Admitting: *Deleted

## 2015-11-09 DIAGNOSIS — R748 Abnormal levels of other serum enzymes: Secondary | ICD-10-CM | POA: Diagnosis not present

## 2015-11-09 LAB — HEPATIC FUNCTION PANEL
ALBUMIN: 3.9 g/dL (ref 3.6–5.1)
ALT: 91 U/L — AB (ref 9–46)
AST: 60 U/L — AB (ref 10–35)
Alkaline Phosphatase: 83 U/L (ref 40–115)
BILIRUBIN DIRECT: 0.1 mg/dL (ref <=0.2)
BILIRUBIN TOTAL: 0.4 mg/dL (ref 0.2–1.2)
Indirect Bilirubin: 0.3 mg/dL (ref 0.2–1.2)
Total Protein: 7 g/dL (ref 6.1–8.1)

## 2015-11-09 NOTE — Addendum Note (Signed)
Addended by: Eulis Foster on: 11/09/2015 09:52 AM   Modules accepted: Orders

## 2015-11-10 ENCOUNTER — Other Ambulatory Visit: Payer: Medicaid Other

## 2015-11-11 ENCOUNTER — Other Ambulatory Visit: Payer: Self-pay | Admitting: Cardiovascular Disease

## 2015-12-03 ENCOUNTER — Ambulatory Visit (HOSPITAL_COMMUNITY)
Admission: EM | Admit: 2015-12-03 | Discharge: 2015-12-03 | Disposition: A | Discharge status: Home / Self Care | Payer: Medicaid Other | Attending: Family Medicine | Admitting: Family Medicine

## 2015-12-03 ENCOUNTER — Encounter (HOSPITAL_COMMUNITY): Payer: Self-pay | Admitting: Emergency Medicine

## 2015-12-03 DIAGNOSIS — K047 Periapical abscess without sinus: Principal | ICD-10-CM

## 2015-12-03 DIAGNOSIS — K029 Dental caries, unspecified: Secondary | ICD-10-CM | POA: Diagnosis not present

## 2015-12-03 MED ORDER — AMOXICILLIN-POT CLAVULANATE 875-125 MG PO TABS: 1.0000 | ORAL_TABLET | Freq: Two times a day (BID) | ORAL | 0 refills | Status: DC

## 2015-12-03 NOTE — ED Provider Notes (Signed)
[place]    CSN: [id number] Arrival date & time: 12/03/15  1804  First Provider Contact:  None   History   Chief Complaint Chief Complaint  Patient presents with  . Dental Pain    HPI Cody Ortega is a 55 y.o. male presenting for dental pain. He reports 2 days of worsening left upper tooth pain that is beginning to extend to his palate, and left face. He denies redness or swelling of the face. Also no fevers. Ibuprofen has provided some relief. No pain with EOM or photophobia. No neck stiffness/pain, or headache.   HPI  Past Medical History:  Diagnosis Date  . Anxiety   . Benzodiazepine dependence ([place])   . Bronchial asthma   . CAD (coronary artery disease)    a. Cath 03/2010: mod RCA stenosis, severe diag stenosis, treated medically given lack of angina.  . 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 Chronic systolic congestive heart failure (HCC)    Acute decompensation, LVEF less than 20%  . CKD (chronic kidney disease), stage IV (HCC)    Stage 3-4  . Depression   . History of cocaine abuse   . Hypertension   . Microcytic anemia   . PAF (paroxysmal atrial fibrillation) ([place])    a. Episode in 2008 with spontaneous conversion  . S/P implantation of automatic cardioverter/defibrillator (AICD)    a. Medtronic, implanted 2012.  . Stroke ([place]) 2004  . Thyroid disease     Patient Active Problem List   Diagnosis Date Noted  . Acute bronchitis 07/09/2015  . Chronic pain syndrome 03/16/2015  . Obstructive sleep apnea 03/16/2015  . Chronic low back pain with right-sided sciatica 03/16/2015  . Sinus brady-tachy syndrome ([place]) 02/20/2014  . Atrial fibrillation ([place]) 12/31/2013  . Automatic implantable cardioverter-defibrillator in situ 03/08/2011  . Erectile dysfunction 02/28/2011  . CAD, NATIVE VESSEL 04/26/2010  . PURE HYPERCHOLESTEROLEMIA 04/12/2010  . HYPERLIPIDEMIA-MIXED 01/11/2010  . Essential hypertension, malignant 12/18/2009  . Chronic  systolic heart failure ([place]) 12/18/2009    Past Surgical History:  Procedure Laterality Date  . CARDIOVERSION N/A 01/07/2014   Procedure: CARDIOVERSION;  Surgeon: Lelon Perla, MD;  Location: [facility] ENDOSCOPY;  Service: Cardiovascular;  Laterality: N/A;  . CARDIOVERSION N/A 02/11/2014   Procedure: CARDIOVERSION;  Surgeon: Pixie Casino, MD;  Location: [facility] ENDOSCOPY;  Service: Cardiovascular;  Laterality: N/A;  . TEE WITHOUT CARDIOVERSION N/A 01/07/2014   Procedure: TRANSESOPHAGEAL ECHOCARDIOGRAM (TEE);  Surgeon: Lelon Perla, MD;  Location: [facility] ENDOSCOPY;  Service: Cardiovascular;  Laterality: N/A;  . TEE WITHOUT CARDIOVERSION N/A 02/11/2014   Procedure: TRANSESOPHAGEAL ECHOCARDIOGRAM (TEE);  Surgeon: Pixie Casino, MD;  Location: [facility] ENDOSCOPY;  Service: Cardiovascular;  Laterality: N/A;  . TONSILLECTOMY    . TRANSTHORACIC ECHOCARDIOGRAM  10/2006, 12/2009   Home Medications    Prior to Admission medications   Medication Sig Start Date End Date Taking? Authorizing Provider  albuterol (VENTOLIN HFA) 108 (90 Base) MCG/ACT inhaler Inhale 2 puffs into the lungs every 6 (six) hours as needed for wheezing or shortness of breath. 07/09/15   Josalyn Funches, MD  alprazolam Duanne Moron) 2 MG tablet Take 1-2 mg by mouth 4 (four) times daily as needed for anxiety.     Historical Provider, MD  amiodarone (PACERONE) 200 MG tablet Take 1 tablet (200 mg total) by mouth daily. 10/09/15   [name], MD  amLODipine (NORVASC) 10 MG tablet TAKE 1 TABLET BY MOUTH EVERY DAY 11/02/15   [name], MD  amoxicillin-clavulanate (AUGMENTIN) 875-125 MG tablet Take 1 tablet by mouth every 12 (twelve) hours. 12/03/15   Patrecia Pour, MD  aspirin 81 MG EC tablet Take 81 mg by mouth daily.      Historical Provider, MD  carvedilol (COREG) 6.25 MG tablet TAKE 1 TABLET BY MOUTH TWICE DAILY WITH A MEAL 06/24/15   Thompson Grayer, MD  cycloSPORINE (RESTASIS) 0.05 % ophthalmic emulsion Place 1 drop into both eyes 2 (two) times daily.     Historical Provider, MD  DULoxetine (CYMBALTA) 60 MG capsule Take 60 mg by mouth daily.    Historical Provider, MD  ferrous sulfate 325 (65 FE) MG tablet Take 325 mg by mouth 2 (two) times daily with a meal. Reported on 06/26/2015    Historical Provider, MD  furosemide (LASIX) 40 MG tablet TAKE 1 TABLET BY MOUTH TWICE DAILY AS NEEDED FOR WEIGHT OVER 300 LBS 10/22/15   [name], MD  hydrALAZINE (APRESOLINE) 25 MG tablet Take 0.5 tablets (12.5 mg total) by mouth 3 (three) times daily. 10/20/15   [name], MD  Lifitegrast Shirley Friar) 5 % SOLN Apply 1 drop to eye 2 (two) times daily.    Historical Provider, MD  mometasone (NASONEX) 50 MCG/ACT nasal spray Place 2 sprays into the nose daily.    Historical Provider, MD  Oxcarbazepine (TRILEPTAL) 300 MG tablet Take 900 mg by mouth at bedtime.    Historical Provider, MD  potassium chloride SA (K-DUR,KLOR-CON) 20 MEQ tablet TAKE 2 TABLETS BY MOUTH DAILY 11/11/15   [name], MD  pravastatin (PRAVACHOL) 40 MG tablet Take 1 tablet (40 mg total) by mouth at bedtime. 10/15/15   [name], MD  sildenafil (VIAGRA) 100 MG tablet Take 50 mg by mouth daily as needed for erectile dysfunction. Reported on 07/09/2015    Historical Provider, MD  traZODone (DESYREL) 50 MG tablet Take 50 mg by mouth at bedtime as needed for sleep.     Historical Provider, MD  XARELTO 20 MG TABS tablet TAKE 1 TABLET BY MOUTH EVERY DAY WITH DINNER 08/20/15   Evans Lance, MD    Family History Family History  Problem Relation Age of Onset  . Breast cancer Mother   . Multiple myeloma Mother   . Prostate cancer Father   . Diabetes Father   . Cerebral palsy Daughter     Social History Social History  Substance Use Topics  . Smoking status: Former Research scientist (life sciences)  . Smokeless tobacco: Never Used  . Alcohol use 0.0 oz/week     Comment: occ     Allergies   Codeine and Hydrocodone-acetaminophen   Review of Systems Review of Systems As above  Physical Exam Triage Vital  Signs ED Triage Vitals  Enc Vitals Group     BP 12/03/15 1948 (!) 159/107     Pulse Rate 12/03/15 1948 69     Resp 12/03/15 1948 22     Temp 12/03/15 1948 97.7 F (36.5 C)     Temp Source 12/03/15 1948 Oral     SpO2 12/03/15 1948 97 %     Weight --      Height --      Head Circumference --      Peak Flow --      Pain Score 12/03/15 1947 8     Pain Loc --      Pain Edu? --      Excl. in [place]? --    No data found.   Updated Vital Signs BP Marland Kitchen)  159/107 (BP Location: Right Arm) Comment (BP Location): large cuff  Pulse 69   Temp 97.7 F (36.5 C) (Oral)   Resp 22   SpO2 97%   Visual Acuity Right Eye Distance:   Left Eye Distance:   Bilateral Distance:    Right Eye Near:   Left Eye Near:    Bilateral Near:     Physical Exam  Constitutional: He is oriented to person, place, and time. He appears well-developed and well-nourished. No distress.  HENT:  Right Ear: External ear normal.  Left Ear: External ear normal.  Nose: Nose normal.  Mouth/Throat: Oropharynx is clear and moist.  left upper molar with multiple caries and surrounding erythema and tenderness to hard palate palpation. Multiple missing teeth.   Eyes: Conjunctivae and EOM are normal. Pupils are equal, round, and reactive to light.  Neck: Normal range of motion. Neck supple. No JVD present.  no adenopathy  Cardiovascular: Normal rate, regular rhythm, normal heart sounds and intact distal pulses.   No murmur heard. Pulmonary/Chest: Effort normal and breath sounds normal. No respiratory distress.  Abdominal: Soft. Bowel sounds are normal. He exhibits no distension. There is no tenderness.  Musculoskeletal: Normal range of motion. He exhibits no edema or tenderness.  Neurological: He is alert and oriented to person, place, and time. He exhibits normal muscle tone.  Skin: Skin is warm and dry. Capillary refill takes less than 2 seconds.  Vitals reviewed.  UC Treatments / Results  Labs (all labs ordered are  listed, but only abnormal results are displayed) Labs Reviewed - No data to display  EKG  EKG Interpretation None      Radiology No results found.  Procedures Procedures (including critical care time)  Medications Ordered in UC Medications - No data to display   Initial Impression / Assessment and Plan / UC Course  I have reviewed the triage vital signs and the nursing notes.  Pertinent labs & imaging results that were available during my care of the patient were reviewed by me and considered in my medical decision making (see chart for details).  Final Clinical Impressions(s) / UC Diagnoses   Final diagnoses:  Infected dental caries   55 y.o. male with dental caries on day 2 of infection. Rx abx and refer to dentist for extraction. Also to continue dental hygiene. No evidence of facial cellulitis or suspicion of orbital cellulitis.   New Prescriptions New Prescriptions   AMOXICILLIN-CLAVULANATE (AUGMENTIN) 875-125 MG TABLET    Take 1 tablet by mouth every 12 (twelve) hours.     Patrecia Pour, MD 12/03/15 2015

## 2015-12-25 ENCOUNTER — Encounter: Payer: Self-pay | Admitting: Physician Assistant

## 2015-12-31 ENCOUNTER — Encounter (INDEPENDENT_AMBULATORY_CARE_PROVIDER_SITE_OTHER): Payer: Self-pay

## 2015-12-31 ENCOUNTER — Ambulatory Visit (INDEPENDENT_AMBULATORY_CARE_PROVIDER_SITE_OTHER): Payer: Medicaid Other | Admitting: *Deleted

## 2015-12-31 DIAGNOSIS — I255 Ischemic cardiomyopathy: Secondary | ICD-10-CM

## 2015-12-31 DIAGNOSIS — Z95810 Presence of automatic (implantable) cardiac defibrillator: Secondary | ICD-10-CM | POA: Diagnosis not present

## 2015-12-31 LAB — CUP PACEART INCLINIC DEVICE CHECK
Battery Voltage: 3.03 V
Brady Statistic RV Percent Paced: 0.33 %
HIGH POWER IMPEDANCE MEASURED VALUE: 323 Ohm
HighPow Impedance: 49 Ohm
HighPow Impedance: 62 Ohm
Implantable Lead Implant Date: 20120806
Implantable Lead Location: 753860
Implantable Lead Model: 185
Implantable Lead Serial Number: 345870
Lead Channel Setting Pacing Amplitude: 2.5 V
Lead Channel Setting Pacing Pulse Width: 0.4 ms
MDC IDC MSMT LEADCHNL RV IMPEDANCE VALUE: 342 Ohm
MDC IDC MSMT LEADCHNL RV PACING THRESHOLD AMPLITUDE: 1.25 V
MDC IDC MSMT LEADCHNL RV PACING THRESHOLD PULSEWIDTH: 0.4 ms
MDC IDC MSMT LEADCHNL RV SENSING INTR AMPL: 12.375 mV
MDC IDC SESS DTM: 20170831182717
MDC IDC SET LEADCHNL RV SENSING SENSITIVITY: 0.3 mV

## 2015-12-31 NOTE — Progress Notes (Signed)
ICD check in clinic. Normal device function. Thresholds and sensing consistent with previous device measurements. Impedance trends stable over time. No evidence of any ventricular arrhythmias. Histogram distribution appropriate for patient and level of activity. No changes made this session. Device programmed at appropriate safety margins. Device programmed to optimize intrinsic conduction. Battery voltage 3.03V, RRT at 2.63V. Pt declines remote follow-up. Patient education completed including shock plan. Alert tones demonstrated for patient. ROV with GT in 03/2016.

## 2016-01-05 ENCOUNTER — Other Ambulatory Visit: Payer: Self-pay | Admitting: Internal Medicine

## 2016-01-10 NOTE — Progress Notes (Signed)
Cardiology Office Note:    Date:  01/11/2016   ID:  Cody Ortega, DOB 27-Jun-1960, MRN [id number]  PCP:  Minerva Ends, MD  Cardiologist:  Dr. [name]   Electrophysiologist:  Dr. Cristopher Peru   Referring MD: [name], MD   Chief Complaint  Patient presents with  . Follow-up    CHF, CAD   History of Present Illness:    Cody Ortega is a 55 y.o. male with a hx of chronic systolic CHF, NICM, s/p ICD, CAD, prior polysubstance abuse, PAF maintaining NSR on Amiodarone.  Last seen by Dr. [name] in 6/17.  Amiodarone dose reduced secondary to prolonged QT. Patient did complain of shortness of breath. Follow-up echo, BNP and chest x-ray were all obtained. BNP was normal. LFTs were somewhat elevated. Pravastatin dose was reduced. Chest x-ray was unremarkable. Ejection fraction on echocardiogram with stable.  He returns for follow-up. He is here alone today. He does note worsening dyspnea over the past couple weeks. He notes increased dyspnea with bending over. He also notes worsening dyspnea with changes in humidity. He did [place]. He seems to note more dyspnea and wheezing since then. He does use albuterol as needed with relief. He denies chest discomfort. He has a nonproductive cough. He is somewhat active. He plays volleyball once a week. He swims occasionally. He is unable to walk much secondary to knee arthritis. He denies orthopnea, PND or edema. Weights have been fairly stable. However, he does take extra Lasix when his weight gets above 300. It is currently 300. He denies syncope. He denies any ICD discharges. He denies any fevers or chills. He does not occasional hemorrhoidal bleeding. This is resolved.  Prior CV studies that were reviewed today include:    Echo 10/28/15 Moderate LVH, EF 35%, diffuse HK, grade 1 diastolic dysfunction, mildly dilated aortic root (38 mm), MAC, moderate LAE, mild RAE, PASP 38  mmHg  LHC 11/11 RCA mid to dist 70% LAD prox 30-40%, mid 30-40%, D1 prox 80-90% then 50% LCx ost 30% Renal arteries patent DCM out of proportion to CAD  Past Medical History:  Diagnosis Date  . Anxiety   . Benzodiazepine dependence ([place])   . Bronchial asthma   . CAD (coronary artery disease)    a. Cath 03/2010: mod RCA stenosis, severe diag stenosis, treated medically given lack of angina.  . 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 Chronic systolic congestive heart failure (HCC)    Acute decompensation, LVEF less than 20%  . CKD (chronic kidney disease), stage IV (HCC)    Stage 3-4  . Depression   . History of cocaine abuse   . Hypertension   . Microcytic anemia   . PAF (paroxysmal atrial fibrillation) ([place])    a. Episode in 2008 with spontaneous conversion  . S/P implantation of automatic cardioverter/defibrillator (AICD)    a. Medtronic, implanted 2012.  . Stroke ([place]) 2004  . Thyroid disease     Past Surgical History:  Procedure Laterality Date  . CARDIOVERSION N/A 01/07/2014   Procedure: CARDIOVERSION;  Surgeon: Lelon Perla, MD;  Location: [facility] ENDOSCOPY;  Service: Cardiovascular;  Laterality: N/A;  . CARDIOVERSION N/A 02/11/2014   Procedure: CARDIOVERSION;  Surgeon: Pixie Casino, MD;  Location: [facility] ENDOSCOPY;  Service: Cardiovascular;  Laterality: N/A;  . TEE WITHOUT CARDIOVERSION N/A 01/07/2014   Procedure: TRANSESOPHAGEAL ECHOCARDIOGRAM (TEE);  Surgeon: Lelon Perla, MD;  Location: [facility]  ENDOSCOPY;  Service: Cardiovascular;  Laterality: N/A;  . TEE WITHOUT CARDIOVERSION N/A 02/11/2014   Procedure: TRANSESOPHAGEAL ECHOCARDIOGRAM (TEE);  Surgeon: Pixie Casino, MD;  Location: [facility] ENDOSCOPY;  Service: Cardiovascular;  Laterality: N/A;  . TONSILLECTOMY    . TRANSTHORACIC ECHOCARDIOGRAM  10/2006, 12/2009    Current Medications: Outpatient Medications Prior to Visit  Medication Sig Dispense Refill  . albuterol (VENTOLIN HFA) 108 (90 Base) MCG/ACT inhaler  Inhale 2 puffs into the lungs every 6 (six) hours as needed for wheezing or shortness of breath. 8 g 11  . alprazolam (XANAX) 2 MG tablet Take 1-2 mg by mouth 4 (four) times daily as needed for anxiety.     Marland Kitchen amiodarone (PACERONE) 200 MG tablet Take 1 tablet (200 mg total) by mouth daily. 30 tablet 6  . amLODipine (NORVASC) 10 MG tablet TAKE 1 TABLET BY MOUTH EVERY DAY 30 tablet 11  . amoxicillin-clavulanate (AUGMENTIN) 875-125 MG tablet Take 1 tablet by mouth every 12 (twelve) hours. 28 tablet 0  . aspirin 81 MG EC tablet Take 81 mg by mouth daily.      . carvedilol (COREG) 6.25 MG tablet TAKE 1 TABLET BY MOUTH TWICE DAILY WITH A MEAL 180 tablet 0  . cycloSPORINE (RESTASIS) 0.05 % ophthalmic emulsion Place 1 drop into both eyes 2 (two) times daily.    . DULoxetine (CYMBALTA) 60 MG capsule Take 60 mg by mouth daily.    . ferrous sulfate 325 (65 FE) MG tablet Take 325 mg by mouth 2 (two) times daily with a meal. Reported on 06/26/2015    . furosemide (LASIX) 40 MG tablet TAKE 1 TABLET BY MOUTH TWICE DAILY AS NEEDED FOR WEIGHT OVER 300 LBS 60 tablet 11  . hydrALAZINE (APRESOLINE) 25 MG tablet Take 0.5 tablets (12.5 mg total) by mouth 3 (three) times daily. 45 tablet 3  . Lifitegrast (XIIDRA) 5 % SOLN Apply 1 drop to eye 2 (two) times daily.    . mometasone (NASONEX) 50 MCG/ACT nasal spray Place 2 sprays into the nose daily.    Marland Kitchen oxcarbazepine (TRILEPTAL) 600 MG tablet TAKE 2 TS PO AT BEDTIME  1  . PAZEO 0.7 % SOLN INSTILL 1 DROP INTO OU Q MORNING  3  . potassium chloride SA (K-DUR,KLOR-CON) 20 MEQ tablet TAKE 2 TABLETS BY MOUTH DAILY 180 tablet 0  . pravastatin (PRAVACHOL) 40 MG tablet Take 1 tablet (40 mg total) by mouth at bedtime. 90 tablet 1  . sildenafil (VIAGRA) 100 MG tablet Take 50 mg by mouth daily as needed for erectile dysfunction. Reported on 07/09/2015    . traZODone (DESYREL) 50 MG tablet Take 50 mg by mouth at bedtime as needed for sleep.     Alveda Reasons 20 MG TABS tablet TAKE 1 TABLET BY  MOUTH EVERY DAY WITH DINNER 30 tablet 6   No facility-administered medications prior to visit.       Allergies:   Codeine and Hydrocodone-acetaminophen   Social History   Social History  . Marital status: Divorced    Spouse name: N/A  . Number of children: N/A  . Years of education: N/A   Occupational History  . Unemployed Unemployed    basketball referee in the past   Social History Main Topics  . Smoking status: Former Research scientist (life sciences)  . Smokeless tobacco: Never Used  . Alcohol use 0.0 oz/week     Comment: occ  . Drug use: No     Comment: Reported history of cocaine abuse off and on   .  Sexual activity: Not on file   Other Topics Concern  . Not on file   Social History Narrative   Living with a son who is a 58 year old.  He is not     working, unemployed for 3 years.  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  Denied use of alcohol.  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           Family History:  The patient's family history includes Breast cancer in his mother; Cerebral palsy in his daughter; Diabetes in his father; Multiple myeloma in his mother; Prostate cancer in his father.   ROS:   Please see the history of present illness.    Review of Systems  Constitution: Positive for malaise/fatigue.  HENT: Positive for headaches.   Eyes: Positive for visual disturbance.  Respiratory: Positive for cough, shortness of breath, snoring and wheezing.   Hematologic/Lymphatic: Positive for bleeding problem. Bruises/bleeds easily.  Musculoskeletal: Positive for back pain, joint swelling and myalgias.  Gastrointestinal: Positive for constipation, nausea and vomiting.  Neurological: Positive for dizziness and loss of balance.  Psychiatric/Behavioral: The patient is nervous/anxious.    All other systems reviewed and are negative.   EKGs/Labs/Other Test Reviewed:    EKG:  EKG is  ordered today.  The ekg ordered today demonstrates NSR, HR 66, normal  axis, IVCD, low voltage, QTc 486 ms, similar to prior tracing dated 10/09/15  Recent Labs: 10/12/2015: Brain Natriuretic Peptide 51.0; BUN 20; Creat 1.69; Magnesium 1.9; Potassium 3.4; Sodium 141; TSH 2.49 11/09/2015: ALT 91   Recent Lipid Panel    Component Value Date/Time   CHOL 165 10/12/2015 1026   TRIG 135 10/12/2015 1026   HDL 42 10/12/2015 1026   CHOLHDL 3.9 10/12/2015 1026   VLDL 27 10/12/2015 1026   LDLCALC 96 10/12/2015 1026     Physical Exam:    VS:  BP (!) 142/100   Pulse 66   Ht '6\' 3"'$  (1.905 m)   Wt 300 lb (136.1 kg)   BMI 37.50 kg/m     Wt Readings from Last 3 Encounters:  01/11/16 300 lb (136.1 kg)  10/09/15 299 lb 12.8 oz (136 kg)  07/09/15 297 lb (134.7 kg)     Physical Exam  Constitutional: He is oriented to person, place, and time. He appears well-developed and well-nourished. No distress.  HENT:  Head: Normocephalic and atraumatic.  Eyes: No scleral icterus.  Neck: No JVD present.  Cardiovascular: Normal rate, regular rhythm and normal heart sounds.   No murmur heard. Pulmonary/Chest: Effort normal. He has no wheezes. He has no rales.  Abdominal: Soft. There is no tenderness.  Musculoskeletal: He exhibits no edema.  Neurological: He is alert and oriented to person, place, and time.  Skin: Skin is warm and dry.  Psychiatric: He has a normal mood and affect.    ASSESSMENT:    1. Dyspnea   2. Chronic systolic heart failure (HCC)   3. Atherosclerosis of native coronary artery of native heart without angina pectoris   4. Essential hypertension, malignant   5. HLD (hyperlipidemia)   6. Paroxysmal atrial fibrillation (HCC)    PLAN:    In order of problems listed above:  1. Dyspnea - He had previous workup a few months ago with normal BNP in stable EF on echocardiogram. His chest x-ray was also unremarkable. I suspect his dyspnea is mainly related to obstructive airways disease. He has had relief  with albuterol as needed in the past. He has a  prior history of smoking. PFTs in 4/16 demonstrated minimal airway obstructive disease. His dyspnea is likely multifactorial and related CHF, CAD, obesity and possibly COPD.  He also has a lot of anxiety and is on his way to see his psychiatrist now.  I suppose anxiety is likely making his dyspnea worse.  At this point, I think he would benefit from pulmonology evaluation. We also could consider cardio pulmonary stress testing in the future.  -  Obtain BMET, BNP, CBC  -  Refer to pulmonology  2. Chronic systolic CHF - EF 11% by echo in 6/17. Overall, his volume appears to be stable. He is NYHA 2b. Since his weight is 300 pounds, I have asked him to go ahead and take an extra dose of Lasix today. Obtain BNP as noted.  3. CAD - He has a history of moderate nonobstructive CAD by LHC in 2011. He denies any clear symptoms of angina. Continue aspirin, beta blocker, statin. If dyspnea continues without clear pulmonary cause, consider cardio pulmonary stress testing to better define his dyspnea.  4. HTN - Blood pressure is elevated today. It is usually better controlled. I have asked him to keep an eye on his blood pressure and to contact us if it continues to run high like today.  5. HL - Continue statin.  6. PAF - Maintaining NSR on Amiodarone.  LFTs elevated previously with plan to repeat LFTs in Jan 2018.  TSH ok in 6/17. CXR was ok in 6/17.  Continue Xarelto at current dose and Amiodarone.  QTc is improved.    Medication Adjustments/Labs and Tests Ordered: Current medicines are reviewed at length with the patient today.  Concerns regarding medicines are outlined above.  Medication changes, Labs and Tests ordered today are outlined in the Patient Instructions noted below. Patient Instructions  Medication Instructions:  No changes.  See your medication list. Take one extra Lasix 40 mg today.  Labwork: Come back one day this week for - BMET, BNP, CBC  Testing/Procedures: None   Follow-Up: Dr.  [name] or Richardson Dopp, PA-C in 3 months.   Any Other Special Instructions Will Be Listed Below (If Applicable). I will refer you to the Pulmonologist (Lung Doctor) for shortness of breath.  If you need a refill on your cardiac medications before your next appointment, please call your pharmacy.   Signed, Richardson Dopp, PA-C  01/11/2016 5:19 PM    [facility] [address], [place], [place]  [address] Phone: [phone]; Fax: [phone]

## 2016-01-11 ENCOUNTER — Ambulatory Visit (INDEPENDENT_AMBULATORY_CARE_PROVIDER_SITE_OTHER): Payer: Medicaid Other | Admitting: Physician Assistant

## 2016-01-11 ENCOUNTER — Ambulatory Visit: Payer: Medicaid Other | Admitting: Physician Assistant

## 2016-01-11 ENCOUNTER — Encounter: Payer: Self-pay | Admitting: Physician Assistant

## 2016-01-11 VITALS — BP 142/100 | HR 66 | Ht 75.0 in | Wt 300.0 lb

## 2016-01-11 DIAGNOSIS — I5022 Chronic systolic (congestive) heart failure: Secondary | ICD-10-CM

## 2016-01-11 DIAGNOSIS — I2510 Atherosclerotic heart disease of native coronary artery without angina pectoris: Secondary | ICD-10-CM | POA: Diagnosis not present

## 2016-01-11 DIAGNOSIS — E785 Hyperlipidemia, unspecified: Secondary | ICD-10-CM

## 2016-01-11 DIAGNOSIS — I480 Paroxysmal atrial fibrillation: Secondary | ICD-10-CM

## 2016-01-11 DIAGNOSIS — R0600 Dyspnea, unspecified: Secondary | ICD-10-CM | POA: Diagnosis not present

## 2016-01-11 DIAGNOSIS — I10 Essential (primary) hypertension: Secondary | ICD-10-CM

## 2016-01-11 NOTE — Patient Instructions (Signed)
Medication Instructions:  No changes.  See your medication list. Take one extra Lasix 40 mg today.  Labwork: Come back one day this week for - BMET, BNP, CBC  Testing/Procedures: None   Follow-Up: Dr. [name] or Richardson Dopp, PA-C in 3 months.   Any Other Special Instructions Will Be Listed Below (If Applicable). I will refer you to the Pulmonologist (Lung Doctor) for shortness of breath.  If you need a refill on your cardiac medications before your next appointment, please call your pharmacy.

## 2016-01-26 ENCOUNTER — Other Ambulatory Visit: Payer: Medicaid Other | Admitting: *Deleted

## 2016-01-26 DIAGNOSIS — R0600 Dyspnea, unspecified: Secondary | ICD-10-CM

## 2016-01-26 DIAGNOSIS — I5022 Chronic systolic (congestive) heart failure: Secondary | ICD-10-CM

## 2016-01-26 DIAGNOSIS — I10 Essential (primary) hypertension: Secondary | ICD-10-CM

## 2016-01-26 LAB — CBC WITH DIFFERENTIAL/PLATELET
Basophils Absolute: 82 cells/uL (ref 0–200)
Basophils Relative: 1 %
EOS ABS: 246 {cells}/uL (ref 15–500)
Eosinophils Relative: 3 %
HEMATOCRIT: 47.7 % (ref 38.5–50.0)
HEMOGLOBIN: 16.6 g/dL (ref 13.2–17.1)
LYMPHS ABS: 2214 {cells}/uL (ref 850–3900)
LYMPHS PCT: 27 %
MCH: 32.7 pg (ref 27.0–33.0)
MCHC: 34.8 g/dL (ref 32.0–36.0)
MCV: 94.1 fL (ref 80.0–100.0)
MONO ABS: 820 {cells}/uL (ref 200–950)
MPV: 9.5 fL (ref 7.5–12.5)
Monocytes Relative: 10 %
NEUTROS PCT: 59 %
Neutro Abs: 4838 cells/uL (ref 1500–7800)
Platelets: 197 10*3/uL (ref 140–400)
RBC: 5.07 MIL/uL (ref 4.20–5.80)
RDW: 14.1 % (ref 11.0–15.0)
WBC: 8.2 10*3/uL (ref 3.8–10.8)

## 2016-01-26 LAB — BASIC METABOLIC PANEL
BUN: 29 mg/dL — AB (ref 7–25)
CHLORIDE: 100 mmol/L (ref 98–110)
CO2: 29 mmol/L (ref 20–31)
Calcium: 9.1 mg/dL (ref 8.6–10.3)
Creat: 2.39 mg/dL — ABNORMAL HIGH (ref 0.70–1.33)
GLUCOSE: 92 mg/dL (ref 65–99)
POTASSIUM: 3.3 mmol/L — AB (ref 3.5–5.3)
SODIUM: 140 mmol/L (ref 135–146)

## 2016-01-26 LAB — BRAIN NATRIURETIC PEPTIDE: Brain Natriuretic Peptide: 55 pg/mL (ref <100)

## 2016-01-27 ENCOUNTER — Telehealth: Payer: Self-pay | Admitting: *Deleted

## 2016-01-27 DIAGNOSIS — I10 Essential (primary) hypertension: Secondary | ICD-10-CM

## 2016-01-27 NOTE — Telephone Encounter (Signed)
Pt notified of lab results and findings by phone with verbal understanding. Pt agreeable to extra 40 meq K+ tonight as well as to change lasix to prn if weight is up 3 lb's or more x 1 day. Pt agreeable to these instructions. BMET 02/05/16

## 2016-02-05 ENCOUNTER — Other Ambulatory Visit: Payer: Medicaid Other

## 2016-02-08 ENCOUNTER — Other Ambulatory Visit: Payer: Medicaid Other | Admitting: *Deleted

## 2016-02-08 DIAGNOSIS — I10 Essential (primary) hypertension: Secondary | ICD-10-CM

## 2016-02-08 LAB — BASIC METABOLIC PANEL
BUN: 25 mg/dL (ref 7–25)
CO2: 25 mmol/L (ref 20–31)
Calcium: 9.4 mg/dL (ref 8.6–10.3)
Chloride: 103 mmol/L (ref 98–110)
Creat: 1.38 mg/dL — ABNORMAL HIGH (ref 0.70–1.33)
GLUCOSE: 100 mg/dL — AB (ref 65–99)
POTASSIUM: 4.1 mmol/L (ref 3.5–5.3)
SODIUM: 139 mmol/L (ref 135–146)

## 2016-02-15 ENCOUNTER — Other Ambulatory Visit: Payer: Self-pay | Admitting: Cardiovascular Disease

## 2016-02-26 ENCOUNTER — Other Ambulatory Visit: Payer: Self-pay | Admitting: Cardiovascular Disease

## 2016-03-17 ENCOUNTER — Encounter: Payer: Self-pay | Admitting: Pulmonary Disease

## 2016-03-17 ENCOUNTER — Ambulatory Visit (INDEPENDENT_AMBULATORY_CARE_PROVIDER_SITE_OTHER): Payer: Medicaid Other | Admitting: Pulmonary Disease

## 2016-03-17 DIAGNOSIS — J449 Chronic obstructive pulmonary disease, unspecified: Secondary | ICD-10-CM

## 2016-03-17 DIAGNOSIS — J302 Other seasonal allergic rhinitis: Secondary | ICD-10-CM | POA: Diagnosis not present

## 2016-03-17 DIAGNOSIS — K219 Gastro-esophageal reflux disease without esophagitis: Secondary | ICD-10-CM | POA: Insufficient documentation

## 2016-03-17 DIAGNOSIS — N184 Chronic kidney disease, stage 4 (severe): Secondary | ICD-10-CM | POA: Insufficient documentation

## 2016-03-17 MED ORDER — BUDESONIDE-FORMOTEROL FUMARATE 80-4.5 MCG/ACT IN AERO: 2.0000 | INHALATION_SPRAY | Freq: Two times a day (BID) | RESPIRATORY_TRACT | 0 refills | Status: DC

## 2016-03-17 MED ORDER — AEROCHAMBER MV MISC
0 refills | Status: DC
Start: 2016-03-17 — End: 2018-10-17

## 2016-03-17 NOTE — Patient Instructions (Addendum)
   I'm starting you on Symbicort - inhale 2 puffs with a spacer twice daily.  Make sure you rinse, gargle, spit, brush your teeth, & brush your tongue after you use the Symbicort to keep from getting thrush.  We will do a breathing and walking test when I see you back in 6 weeks.  Call me if you have any questions or concerns.  TESTS ORDERED: 1. FULL PFTs w/ Xopenex at follow-up 2. 6MWT on room air at follow-up 3. Serum RAST Panel & Alpha-1 Antitrypsin Phenotype 4. Esophagram

## 2016-03-17 NOTE — Progress Notes (Signed)
Subjective:    Patient ID: Cody Ortega, male    DOB: Jan 20, 1961, 55 y.o.   MRN: [id number]  HPI He reports that he has noticed that upon awakening first thing in the morning he has had coughing "fits" that was only productive of a white foam beginning several months ago. He reports he had associated dry heaves with his coughing. He has noticed that as the weather has been cooler he has noticed increased dyspnea at rest as well as with exertion while playing volleyball. He has had intermittent wheezing of varying severity. He denies any chest pain or pressure. He denies any chest tightness. He reports occasional reflux. Denies any morning brash water taste. He doesn't wake up at night with dyspnea or coughing. He does have sleep apnea but hasn't been able to tolerate CPAP therapy. He reports infrequent bronchitis. No history of pneumonia. He reports as a child and young adult he felt like he was more out of breath that he should be. He was diagnosed with asthma after he moved to [facility]. He reports his albuterol inhaler does seem to help.   Review of Systems  He reports seasonal allergies with sinus pressure and congestion. No rashes or abnormal bruising. A pertinent 14 point review of systems is negative except as per the history of presenting illness.  Allergies  Allergen Reactions  . Codeine Itching and Nausea And Vomiting  . Hydrocodone-Acetaminophen Itching and Nausea And Vomiting    Current Outpatient Prescriptions on File Prior to Visit  Medication Sig Dispense Refill  . albuterol (VENTOLIN HFA) 108 (90 Base) MCG/ACT inhaler Inhale 2 puffs into the lungs every 6 (six) hours as needed for wheezing or shortness of breath. 8 g 11  . alprazolam (XANAX) 2 MG tablet Take 1-2 mg by mouth 4 (four) times daily as needed for anxiety.     Marland Kitchen amiodarone (PACERONE) 200 MG tablet Take 1 tablet (200 mg total) by mouth daily. 30 tablet 6  . amLODipine (NORVASC) 10 MG tablet TAKE 1 TABLET BY MOUTH EVERY  DAY 30 tablet 11  . aspirin 81 MG EC tablet Take 81 mg by mouth daily.      . carvedilol (COREG) 6.25 MG tablet TAKE 1 TABLET BY MOUTH TWICE DAILY WITH A MEAL 180 tablet 0  . DULoxetine (CYMBALTA) 60 MG capsule Take 60 mg by mouth daily.    . ferrous sulfate 325 (65 FE) MG tablet Take 325 mg by mouth 2 (two) times daily with a meal. Reported on 06/26/2015    . furosemide (LASIX) 40 MG tablet TAKE 1 TABLET BY MOUTH TWICE DAILY AS NEEDED FOR WEIGHT OVER 300 LBS 60 tablet 11  . hydrALAZINE (APRESOLINE) 25 MG tablet TAKE 1/2 TABLET BY MOUTH THREE TIMES DAILY 45 tablet 10  . Lifitegrast (XIIDRA) 5 % SOLN Apply 1 drop to eye 2 (two) times daily.    . mometasone (NASONEX) 50 MCG/ACT nasal spray Place 2 sprays into the nose daily.    Marland Kitchen oxcarbazepine (TRILEPTAL) 600 MG tablet TAKE 2 TS PO AT BEDTIME  1  . PAZEO 0.7 % SOLN INSTILL 1 DROP INTO OU Q MORNING  3  . potassium chloride SA (K-DUR,KLOR-CON) 20 MEQ tablet TAKE 2 TABLETS BY MOUTH DAILY 180 tablet 3  . pravastatin (PRAVACHOL) 40 MG tablet Take 1 tablet (40 mg total) by mouth at bedtime. 90 tablet 1  . sildenafil (VIAGRA) 100 MG tablet Take 50 mg by mouth daily as needed for erectile dysfunction. Reported on 07/09/2015    .  traZODone (DESYREL) 50 MG tablet Take 50 mg by mouth at bedtime as needed for sleep.     Alveda Reasons 20 MG TABS tablet TAKE 1 TABLET BY MOUTH EVERY DAY WITH DINNER 30 tablet 6   No current facility-administered medications on file prior to visit.     Past Medical History:  Diagnosis Date  . Anxiety   . Benzodiazepine dependence ([place])   . Bronchial asthma   . CAD (coronary artery disease)    a. Cath 03/2010: mod RCA stenosis, severe diag stenosis, treated medically given lack of angina.  . 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 Chronic systolic congestive heart failure (HCC)    Acute decompensation, LVEF less than 20%  . CKD (chronic kidney disease), stage IV (HCC)    Stage 3-4  . Depression   . History of cocaine  abuse   . Hypertension   . Microcytic anemia   . PAF (paroxysmal atrial fibrillation) ([place])    a. Episode in 2008 with spontaneous conversion  . S/P implantation of automatic cardioverter/defibrillator (AICD)    a. Medtronic, implanted 2012.  . Stroke (cerebrum) ([place])   . Stroke [facility]) 2004  . Thyroid disease     Past Surgical History:  Procedure Laterality Date  . CARDIOVERSION N/A 01/07/2014   Procedure: CARDIOVERSION;  Surgeon: Lelon Perla, MD;  Location: [facility] ENDOSCOPY;  Service: Cardiovascular;  Laterality: N/A;  . CARDIOVERSION N/A 02/11/2014   Procedure: CARDIOVERSION;  Surgeon: Pixie Casino, MD;  Location: [facility] ENDOSCOPY;  Service: Cardiovascular;  Laterality: N/A;  . PACEMAKER INSERTION    . TEE WITHOUT CARDIOVERSION N/A 01/07/2014   Procedure: TRANSESOPHAGEAL ECHOCARDIOGRAM (TEE);  Surgeon: Lelon Perla, MD;  Location: [facility] ENDOSCOPY;  Service: Cardiovascular;  Laterality: N/A;  . TEE WITHOUT CARDIOVERSION N/A 02/11/2014   Procedure: TRANSESOPHAGEAL ECHOCARDIOGRAM (TEE);  Surgeon: Pixie Casino, MD;  Location: [facility] ENDOSCOPY;  Service: Cardiovascular;  Laterality: N/A;  . TONSILLECTOMY    . TRANSTHORACIC ECHOCARDIOGRAM  10/2006, 12/2009    Family History  Problem Relation Age of Onset  . Breast cancer Mother   . Multiple myeloma Mother   . Prostate cancer Father   . Diabetes Father   . Peripheral vascular disease Father   . Cerebral palsy Daughter   . Lung disease Neg Hx     Social History   Social History  . Marital status: Divorced    Spouse name: N/A  . Number of children: N/A  . Years of education: N/A   Occupational History  . Unemployed Unemployed    basketball referee in the past   Social History Main Topics  . Smoking status: Former Research scientist (life sciences)  . Smokeless tobacco: Never Used     Comment: Significant Second-hand and only 3 months himself  . Alcohol use 0.0 oz/week     Comment: occ  . Drug use: No     Comment: Reported history of cocaine abuse  off and on   . Sexual activity: Not Asked   Other Topics Concern  . None   Social History Narrative   Living with a son who is a 4 year old.  He is not     working, unemployed for 3 years.  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  Denied use of alcohol.  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       [facility]:   Originally from [place], [place]. He grew up in [place].  He moved to [place] in 1985. 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 No pets currently. No bird exposure. He did have mold in a previous home. No hot tub exposure.            Objective:   Physical Exam BP (!) 150/110 (BP Location: Right Arm, Cuff Size: Normal)   Pulse 78   Ht '6\' 3"'$  (1.905 m)   Wt (!) 304 lb (137.9 kg)   SpO2 97%   BMI 38.00 kg/m  General:  Awake. Alert. No acute distress. Central obesity. Integument:  Warm & dry. No rash on exposed skin. No bruising. Lymphatics:  No appreciated cervical or supraclavicular lymphadenoapthy. HEENT:  Moist mucus membranes. No oral ulcers. No scleral injection or icterus. Mild bilateral nasal turbinate swelling. Cardiovascular:  Regular rate. No edema. No appreciable JVD.  Pulmonary:  Good aeration & clear to auscultation bilaterally. Symmetric chest wall expansion. No accessory muscle use. Abdomen: Soft. Normal bowel sounds. Protuberant. Grossly nontender. Musculoskeletal:  Normal bulk and tone. Hand grip strength 5/5 bilaterally. No joint deformity or effusion appreciated. Neurological:  CN 2-12 grossly in tact. No meningismus. Moving all 4 extremities equally. Symmetric brachioradialis deep tendon reflexes. Psychiatric:  Mood and affect congruent. Speech normal rhythm, rate & tone.   PFT 07/30/14: FVC 4.19 L (83%) FEV1 3.36 L (74%) FEV1/FVC 0.68 FEF 25-75 2.34 L (60%) TLC 7.96 L (99%) RV 122% ERV 8% DLCO uncorrected 80%  IMAGING CXR PA/LAT 10/12/15 (personally reviewed by me):  No  focal opacity or mass appreciated. No pleural effusion. Heart normal in size & mediastinum normal in contour.   LABS 12/25/13 UDS:  Positive for Benzodiazepines & THC    Assessment & Plan:  55 y.o. male with Chronic allergic rhinitis that is seasonal as well as previously diagnosed asthma which I suspect is an overlap with mild COPD given his previous pulmonary function testing from 2016 which does show mild airway obstruction. Certainly reflux could be contributing to his early morning cough as well although he remains asymptomatic. I am starting the patient on additional inhaler medications to help with his underlying COPD/asthma overlap. I instructed the patient contact my office if he had any new breathing problems or questions before his next appointment.  1. COPD/Asthma Overlap: Screening for alpha-1 antitrypsin deficiency. Checking full pulmonary function testing as well as 6 minute walk test on room air at follow-up appointment. Continuing albuterol inhaler as needed. Starting Symbicort 80/4.5 with spacer twice daily. Patient counseled on appropriate oral hygiene. 2. Chronic Seasonal Allergic Rhinitis: Checking RAST panel. Deferring treatment for now. 3. GERD: Checking esophagogram/gram swallow. 4. Health Maintenance: Patient contemplating immunizations. 5. Follow-up: Patient to return to clinic in 6 weeks or sooner if needed.  Sonia Baller Ashok Cordia, M.D. [facility] Pulmonary & Critical Care Pager:  [phone] After 3pm or if no response, call [phone] 3:42 PM 03/17/16

## 2016-03-28 ENCOUNTER — Other Ambulatory Visit: Payer: Self-pay | Admitting: Internal Medicine

## 2016-03-29 ENCOUNTER — Ambulatory Visit (HOSPITAL_COMMUNITY)
Admission: RE | Admit: 2016-03-29 | Discharge: 2016-03-29 | Disposition: A | Discharge status: Home / Self Care | Payer: Medicaid Other | Source: Ambulatory Visit | Attending: Pulmonary Disease | Admitting: Pulmonary Disease

## 2016-03-29 DIAGNOSIS — K219 Gastro-esophageal reflux disease without esophagitis: Secondary | ICD-10-CM | POA: Diagnosis not present

## 2016-03-31 ENCOUNTER — Other Ambulatory Visit: Payer: Medicaid Other | Admitting: *Deleted

## 2016-03-31 ENCOUNTER — Ambulatory Visit (INDEPENDENT_AMBULATORY_CARE_PROVIDER_SITE_OTHER): Payer: Medicaid Other | Admitting: *Deleted

## 2016-03-31 DIAGNOSIS — I5022 Chronic systolic (congestive) heart failure: Secondary | ICD-10-CM

## 2016-03-31 DIAGNOSIS — J449 Chronic obstructive pulmonary disease, unspecified: Secondary | ICD-10-CM

## 2016-04-04 ENCOUNTER — Telehealth: Payer: Self-pay | Admitting: Pulmonary Disease

## 2016-04-05 LAB — CUP PACEART INCLINIC DEVICE CHECK
Date Time Interrogation Session: 20171130221359
HIGH POWER IMPEDANCE MEASURED VALUE: 56 Ohm
HighPow Impedance: 323 Ohm
HighPow Impedance: 45 Ohm
Implantable Lead Location: 753860
Implantable Lead Serial Number: 345870
Implantable Pulse Generator Implant Date: 20120806
Lead Channel Pacing Threshold Amplitude: 1.125 V
Lead Channel Setting Pacing Amplitude: 2.5 V
MDC IDC LEAD IMPLANT DT: 20120806
MDC IDC LEAD MODEL: 185
MDC IDC MSMT BATTERY VOLTAGE: 3.05 V
MDC IDC MSMT LEADCHNL RV IMPEDANCE VALUE: 323 Ohm
MDC IDC MSMT LEADCHNL RV PACING THRESHOLD PULSEWIDTH: 0.4 ms
MDC IDC MSMT LEADCHNL RV SENSING INTR AMPL: 12.25 mV
MDC IDC MSMT LEADCHNL RV SENSING INTR AMPL: 9.75 mV
MDC IDC SET LEADCHNL RV PACING PULSEWIDTH: 0.4 ms
MDC IDC SET LEADCHNL RV SENSING SENSITIVITY: 0.3 mV
MDC IDC STAT BRADY RV PERCENT PACED: 0.13 %

## 2016-04-05 NOTE — Telephone Encounter (Signed)
Notes Recorded by Javier Glazier, MD on 04/03/2016 at 5:19 AM EST Please let the patient know that his esophagram was normal. Thank you. ------------------------------------------ Spoke with pt. He is aware of results. Nothing further was needed.

## 2016-04-05 NOTE — Progress Notes (Signed)
ICD check in clinic. Normal device function. Threshold and sensing consistent with previous device measurements. Impedance trends stable over time. No evidence of any ventricular arrhythmias. Histogram distribution appropriate for patient and level of activity. Stable thoracic impedance. No changes made this session. Device programmed at appropriate safety margins. Device programmed to optimize intrinsic conduction. Batt voltage 3.05V (ERI 2.63V). Patient will follow up with GT in 3 months. Patient education completed including shock plan. Alert tones demonstrated for patient.

## 2016-04-06 LAB — ALPHA-1 ANTITRYPSIN PHENOTYPE: A1 ANTITRYPSIN: 170 mg/dL (ref 83–199)

## 2016-04-09 ENCOUNTER — Other Ambulatory Visit: Payer: Self-pay | Admitting: Cardiovascular Disease

## 2016-04-13 ENCOUNTER — Encounter: Payer: Self-pay | Admitting: Cardiovascular Disease

## 2016-04-13 ENCOUNTER — Ambulatory Visit (INDEPENDENT_AMBULATORY_CARE_PROVIDER_SITE_OTHER): Payer: Medicaid Other | Admitting: Cardiovascular Disease

## 2016-04-13 ENCOUNTER — Telehealth: Payer: Self-pay | Admitting: Pulmonary Disease

## 2016-04-13 VITALS — BP 148/80 | HR 64 | Ht 75.0 in | Wt 306.4 lb

## 2016-04-13 DIAGNOSIS — I480 Paroxysmal atrial fibrillation: Secondary | ICD-10-CM

## 2016-04-13 DIAGNOSIS — N183 Chronic kidney disease, stage 3 (moderate): Secondary | ICD-10-CM

## 2016-04-13 DIAGNOSIS — Z79899 Other long term (current) drug therapy: Secondary | ICD-10-CM | POA: Diagnosis not present

## 2016-04-13 DIAGNOSIS — I129 Hypertensive chronic kidney disease with stage 1 through stage 4 chronic kidney disease, or unspecified chronic kidney disease: Secondary | ICD-10-CM

## 2016-04-13 DIAGNOSIS — R0602 Shortness of breath: Secondary | ICD-10-CM | POA: Diagnosis not present

## 2016-04-13 NOTE — Patient Instructions (Signed)
Medication Instructions:  Your physician recommends that you continue on your current medications as directed. Please refer to the Current Medication list given to you today.  Labwork: Your physician recommends that you return for a FASTING LIPID, TSH and CMP in 6 MONTHS (1 week prior to Dr Burt Knack appointment)--nothing to eat or drink after midnight, lab opens at 7:30 AM  Testing/Procedures: A chest x-ray takes a picture of the organs and structures inside the chest, including the heart, lungs, and blood vessels. This test can show several things, including, whether the heart is enlarges; whether fluid is building up in the lungs; and whether pacemaker / defibrillator leads are still in place.  This is due one week prior to your 6 month office visit with Dr Burt Knack.   Follow-Up: Your physician wants you to follow-up in: 6 MONTHS with Dr Burt Knack. You will receive a reminder letter in the mail two months in advance. If you don't receive a letter, please call our office to schedule the follow-up appointment.   Any Other Special Instructions Will Be Listed Below (If Applicable).     If you need a refill on your cardiac medications before your next appointment, please call your pharmacy.

## 2016-04-13 NOTE — Progress Notes (Signed)
Cardiology Office Note Date:  04/13/2016   ID:  Cody Ortega, DOB 1961/03/15, MRN [id number]  PCP:  Minerva Ends, MD  Cardiologist:  [name], MD    Chief Complaint  Patient presents with  . Coronary Artery Disease  . Atrial Fibrillation     History of Present Illness: Cody Ortega is a 55 y.o. male who presents for follow-up evaluation. He is followed for chronic systolic heart failure secondary to underlying nonischemic cardiomyopathy. He also has a history of CAD, polysubstance abuse, chronic kidney disease, malignant hypertension, and paroxysmal atrial fibrillation treated with a rhythm control strategy using amiodarone.  The patient is here alone today. He underwent evaluation for CHF at the time of his last visit with me as he complained of dyspnea, cough, and PND. Chest XRay and BNP were normal. He has been doing better, still limited primarily with issues related to arthritis. Admits to shortness of breath with activity, not changed recently. No edema, orthopnea, or PND. Able to play volleyball but admits he doesn't move around much when he plays.    Past Medical History:  Diagnosis Date  . Anxiety   . Benzodiazepine dependence ([place])   . Bronchial asthma   . CAD (coronary artery disease)    a. Cath 03/2010: mod RCA stenosis, severe diag stenosis, treated medically given lack of angina.  . 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 Chronic systolic congestive heart failure (HCC)    Acute decompensation, LVEF less than 20%  . CKD (chronic kidney disease), stage IV (HCC)    Stage 3-4  . Depression   . History of cocaine abuse   . Hypertension   . Microcytic anemia   . PAF (paroxysmal atrial fibrillation) ([place])    a. Episode in 2008 with spontaneous conversion  . S/P implantation of automatic cardioverter/defibrillator (AICD)    a. Medtronic, implanted 2012.  . Stroke (cerebrum) ([place])   . Stroke [facility]) 2004  . Thyroid disease     Past  Surgical History:  Procedure Laterality Date  . CARDIOVERSION N/A 01/07/2014   Procedure: CARDIOVERSION;  Surgeon: Lelon Perla, MD;  Location: [facility] ENDOSCOPY;  Service: Cardiovascular;  Laterality: N/A;  . CARDIOVERSION N/A 02/11/2014   Procedure: CARDIOVERSION;  Surgeon: Pixie Casino, MD;  Location: [facility] ENDOSCOPY;  Service: Cardiovascular;  Laterality: N/A;  . PACEMAKER INSERTION    . TEE WITHOUT CARDIOVERSION N/A 01/07/2014   Procedure: TRANSESOPHAGEAL ECHOCARDIOGRAM (TEE);  Surgeon: Lelon Perla, MD;  Location: [facility] ENDOSCOPY;  Service: Cardiovascular;  Laterality: N/A;  . TEE WITHOUT CARDIOVERSION N/A 02/11/2014   Procedure: TRANSESOPHAGEAL ECHOCARDIOGRAM (TEE);  Surgeon: Pixie Casino, MD;  Location: [facility] ENDOSCOPY;  Service: Cardiovascular;  Laterality: N/A;  . TONSILLECTOMY    . TRANSTHORACIC ECHOCARDIOGRAM  10/2006, 12/2009    Current Outpatient Prescriptions  Medication Sig Dispense Refill  . albuterol (VENTOLIN HFA) 108 (90 Base) MCG/ACT inhaler Inhale 2 puffs into the lungs every 6 (six) hours as needed for wheezing or shortness of breath. 8 g 11  . alprazolam (XANAX) 2 MG tablet Take 1-2 mg by mouth 4 (four) times daily as needed for anxiety.     Marland Kitchen amiodarone (PACERONE) 200 MG tablet Take 1 tablet (200 mg total) by mouth daily. 30 tablet 6  . amLODipine (NORVASC) 10 MG tablet TAKE 1 TABLET BY MOUTH EVERY DAY 30 tablet 11  . aspirin 81 MG EC tablet Take 81 mg by mouth daily.      . budesonide-formoterol (  SYMBICORT) 80-4.5 MCG/ACT inhaler Inhale 2 puffs into the lungs 2 (two) times daily. 1 Inhaler 0  . carvedilol (COREG) 6.25 MG tablet TAKE 1 TABLET BY MOUTH TWICE DAILY WITH A MEAL 180 tablet 2  . DULoxetine (CYMBALTA) 60 MG capsule Take 60 mg by mouth daily.    . ferrous sulfate 325 (65 FE) MG tablet Take 325 mg by mouth 2 (two) times daily with a meal. Reported on 06/26/2015    . furosemide (LASIX) 40 MG tablet TAKE 1 TABLET BY MOUTH TWICE DAILY AS NEEDED FOR WEIGHT OVER 300  LBS 60 tablet 11  . hydrALAZINE (APRESOLINE) 25 MG tablet TAKE 1/2 TABLET BY MOUTH THREE TIMES DAILY 45 tablet 10  . Lifitegrast (XIIDRA) 5 % SOLN Apply 1 drop to eye 2 (two) times daily.    . mometasone (NASONEX) 50 MCG/ACT nasal spray Place 2 sprays into the nose daily.    . mometasone (NASONEX) 50 MCG/ACT nasal spray Place 2 sprays into the nose daily.    Marland Kitchen oxcarbazepine (TRILEPTAL) 600 MG tablet Take 2 tablets by mouth at night  1  . PAZEO 0.7 % SOLN Place 1 drop in each eye each morning  3  . potassium chloride SA (K-DUR,KLOR-CON) 20 MEQ tablet TAKE 2 TABLETS BY MOUTH DAILY 180 tablet 3  . pravastatin (PRAVACHOL) 40 MG tablet Take 1 tablet (40 mg total) by mouth at bedtime. 90 tablet 1  . sildenafil (VIAGRA) 100 MG tablet Take 50 mg by mouth daily as needed for erectile dysfunction. Reported on 07/09/2015    . Spacer/Aero-Holding Chambers (AEROCHAMBER MV) inhaler Use as instructed 1 each 0  . traZODone (DESYREL) 50 MG tablet Take 50 mg by mouth at bedtime as needed for sleep.     Alveda Reasons 20 MG TABS tablet TAKE 1 TABLET BY MOUTH EVERY DAY WITH DINNER 30 tablet 9   No current facility-administered medications for this visit.     Allergies:   Codeine and Hydrocodone-acetaminophen   Social History:  The patient  reports that he has quit smoking. He has never used smokeless tobacco. He reports that he drinks alcohol. He reports that he does not use drugs.   Family History:  The patient's  family history includes Breast cancer in his mother; Cerebral palsy in his daughter; Diabetes in his father; Multiple myeloma in his mother; Peripheral vascular disease in his father; Prostate cancer in his father.    ROS:  Please see the history of present illness.  Otherwise, review of systems is positive for cough, blood in stool, back pain, dizziness, easy bruising, snoring, wheezing, constipation, nausea, anxiety, difficulty urinating, balance problems.  All other systems are reviewed and negative.     PHYSICAL EXAM: VS:  BP (!) 148/80   Pulse 64   Ht '6\' 3"'$  (1.905 m)   Wt (!) 306 lb 6.4 oz (139 kg)   SpO2 98%   BMI 38.30 kg/m  , BMI Body mass index is 38.3 kg/m. GEN: pleasant overweight male, in no acute distress  HEENT: normal  Neck: no JVD, no masses. No carotid bruits Cardiac: RRR without murmur or gallop                Respiratory:  clear to auscultation bilaterally, normal work of breathing GI: soft, nontender, nondistended, + BS MS: no deformity or atrophy  Ext: no pretibial edema, pedal pulses 2+= bilaterally Skin: warm and dry, no rash Neuro:  Strength and sensation are intact Psych: euthymic mood, full affect  EKG:  EKG is not ordered today.  Recent Labs: 10/12/2015: Magnesium 1.9; TSH 2.49 11/09/2015: ALT 91 01/26/2016: Brain Natriuretic Peptide 55.0; Hemoglobin 16.6; Platelets 197 02/08/2016: BUN 25; Creat 1.38; Potassium 4.1; Sodium 139   Lipid Panel     Component Value Date/Time   CHOL 165 10/12/2015 1026   TRIG 135 10/12/2015 1026   HDL 42 10/12/2015 1026   CHOLHDL 3.9 10/12/2015 1026   VLDL 27 10/12/2015 1026   LDLCALC 96 10/12/2015 1026      Wt Readings from Last 3 Encounters:  04/13/16 (!) 306 lb 6.4 oz (139 kg)  03/17/16 (!) 304 lb (137.9 kg)  01/11/16 300 lb (136.1 kg)     Cardiac Studies Reviewed: Echo 10-28-2015: Study Conclusions  - Left ventricle: The cavity size was normal. Wall thickness was   increased in a pattern of moderate LVH. The estimated ejection   fraction was 35%. Diffuse hypokinesis. Doppler parameters are   consistent with abnormal left ventricular relaxation (grade 1   diastolic dysfunction). - Aortic valve: There was no stenosis. - Aorta: Mildly dilated aortic root. Aortic root dimension: 38 mm   (ED). - Mitral valve: Mildly calcified annulus. Mildly calcified leaflets   . There was no significant regurgitation. - Left atrium: The atrium was moderately dilated. - Right ventricle: Poorly visualized. The cavity  size was normal.   Pacer wire or catheter noted in right ventricle. Systolic   function was normal. - Right atrium: The atrium was mildly dilated. - Tricuspid valve: Peak RV-RA gradient (S): 23 mm Hg. - Pulmonary arteries: PA peak pressure: 38 mm Hg (S). - Systemic veins: IVC measured 2.2 cm with < 50% respirophasic   variation, suggesting RA pressure 15 mmHg.  Impressions:  - Technically difficult study with poor acoustic windows. Echo   contrast may have helped but was not used. Normal LV size with   moderate LV hypertrophy. Diffuse hypokinesis, EF 35%. Normal RV   size and systolic function (poorly visualized).   ASSESSMENT AND PLAN: 1.  Chronic systolic heart failure, NYHA II: appears stable on current Rx. Medical program is reviewed and will be continued. Lifestyle modification reviewed with patient. He has been missing some medications and medication adherence is reinforced.  2. CAD, native vessel, without angina: has done well with medical therapy for treatment of diagonal branch stenosis. No hx of angina.   3. Malignant HTN with CKD III: overall stable. In past has had labile HTN seems to be under better control.  4. Paroxysmal atrial fibrillation: tolerating amiodarone and xarelto. Follow-up labs for amiodarone surveillance when he returns in 6 months.   5. CKD 3: last creatinine improved from his baseline (1.38 mg/dL)  Current medicines are reviewed with the patient today.  The patient does not have concerns regarding medicines.  Labs/ tests ordered today include:  No orders of the defined types were placed in this encounter.   Disposition:   FU 6 months  Signed, [name], MD  04/13/2016 10:45 AM    [facility] [place], [place], [place]  [address] Phone: [phone]; Fax: [phone]

## 2016-04-13 NOTE — Telephone Encounter (Signed)
lmtcb

## 2016-04-15 MED ORDER — BUDESONIDE-FORMOTEROL FUMARATE 80-4.5 MCG/ACT IN AERO: 2.0000 | INHALATION_SPRAY | Freq: Two times a day (BID) | RESPIRATORY_TRACT | 11 refills | Status: DC

## 2016-04-15 NOTE — Telephone Encounter (Signed)
Called and lmom to make the pt aware that the symbicort has been sent to the pharmacy.

## 2016-04-22 ENCOUNTER — Telehealth: Payer: Self-pay

## 2016-04-22 MED ORDER — BECLOMETHASONE DIPROPIONATE 80 MCG/ACT IN AERS: 2.0000 | INHALATION_SPRAY | Freq: Two times a day (BID) | RESPIRATORY_TRACT | 6 refills | Status: DC

## 2016-04-22 NOTE — Telephone Encounter (Signed)
Let's send in a prescription for Qvar 58mcg - 2 puffs inhaled twice daily. #1 inhaler with 3 refills. Thanks.

## 2016-04-22 NOTE — Telephone Encounter (Signed)
Received PA request for smybicort 80 from [facility]. I spoke with consuela with [employer], who states covered alternatives are qvar. Since pt has not tried and failed qvar, pt will need to try this medication before a PA can be started.  JN please advise. Thanks.

## 2016-04-26 ENCOUNTER — Other Ambulatory Visit: Payer: Self-pay | Admitting: Cardiovascular Disease

## 2016-04-26 DIAGNOSIS — R748 Abnormal levels of other serum enzymes: Secondary | ICD-10-CM

## 2016-05-09 ENCOUNTER — Other Ambulatory Visit: Payer: Medicaid Other | Admitting: *Deleted

## 2016-05-09 ENCOUNTER — Other Ambulatory Visit: Payer: Self-pay | Admitting: Cardiovascular Disease

## 2016-05-09 ENCOUNTER — Other Ambulatory Visit: Payer: Self-pay | Admitting: *Deleted

## 2016-05-09 DIAGNOSIS — I5022 Chronic systolic (congestive) heart failure: Secondary | ICD-10-CM

## 2016-05-09 DIAGNOSIS — R059 Cough, unspecified: Secondary | ICD-10-CM

## 2016-05-09 DIAGNOSIS — E7800 Pure hypercholesterolemia, unspecified: Secondary | ICD-10-CM

## 2016-05-09 DIAGNOSIS — I10 Essential (primary) hypertension: Secondary | ICD-10-CM

## 2016-05-09 DIAGNOSIS — E785 Hyperlipidemia, unspecified: Secondary | ICD-10-CM

## 2016-05-09 DIAGNOSIS — R0602 Shortness of breath: Secondary | ICD-10-CM

## 2016-05-09 DIAGNOSIS — R05 Cough: Secondary | ICD-10-CM

## 2016-05-09 MED ORDER — AMIODARONE HCL 200 MG PO TABS: 200.0000 mg | ORAL_TABLET | Freq: Every day | ORAL | 5 refills | Status: DC

## 2016-05-09 NOTE — Addendum Note (Signed)
Addended by: Eulis Foster on: 05/09/2016 03:43 PM   Modules accepted: Orders

## 2016-05-10 LAB — COMPREHENSIVE METABOLIC PANEL
ALBUMIN: 4.5 g/dL (ref 3.5–5.5)
ALK PHOS: 88 IU/L (ref 39–117)
ALT: 90 IU/L — AB (ref 0–44)
AST: 62 IU/L — ABNORMAL HIGH (ref 0–40)
Albumin/Globulin Ratio: 1.3 (ref 1.2–2.2)
BILIRUBIN TOTAL: 0.5 mg/dL (ref 0.0–1.2)
BUN / CREAT RATIO: 15 (ref 9–20)
BUN: 24 mg/dL (ref 6–24)
CO2: 21 mmol/L (ref 18–29)
CREATININE: 1.63 mg/dL — AB (ref 0.76–1.27)
Calcium: 9.7 mg/dL (ref 8.7–10.2)
Chloride: 103 mmol/L (ref 96–106)
GFR calc non Af Amer: 47 mL/min/{1.73_m2} — ABNORMAL LOW (ref >59)
GFR, EST AFRICAN AMERICAN: 55 mL/min/{1.73_m2} — AB (ref >59)
GLUCOSE: 107 mg/dL — AB (ref 65–99)
Globulin, Total: 3.4 g/dL (ref 1.5–4.5)
Potassium: 3.8 mmol/L (ref 3.5–5.2)
Sodium: 145 mmol/L — ABNORMAL HIGH (ref 134–144)
TOTAL PROTEIN: 7.9 g/dL (ref 6.0–8.5)

## 2016-05-10 LAB — LIPID PANEL
CHOL/HDL RATIO: 5 ratio (ref 0.0–5.0)
Cholesterol, Total: 212 mg/dL — ABNORMAL HIGH (ref 100–199)
HDL: 42 mg/dL (ref >39)
LDL Calculated: 144 mg/dL — ABNORMAL HIGH (ref 0–99)
Triglycerides: 129 mg/dL (ref 0–149)
VLDL Cholesterol Cal: 26 mg/dL (ref 5–40)

## 2016-05-10 LAB — TSH: TSH: 1.28 u[IU]/mL (ref 0.450–4.500)

## 2016-05-13 ENCOUNTER — Telehealth: Payer: Self-pay | Admitting: Pulmonary Disease

## 2016-05-13 ENCOUNTER — Ambulatory Visit (INDEPENDENT_AMBULATORY_CARE_PROVIDER_SITE_OTHER): Payer: Medicaid Other | Admitting: Pulmonary Disease

## 2016-05-13 ENCOUNTER — Encounter: Payer: Self-pay | Admitting: Pulmonary Disease

## 2016-05-13 VITALS — BP 134/72 | HR 63 | Ht 75.0 in | Wt 308.0 lb

## 2016-05-13 DIAGNOSIS — J449 Chronic obstructive pulmonary disease, unspecified: Secondary | ICD-10-CM

## 2016-05-13 DIAGNOSIS — J302 Other seasonal allergic rhinitis: Secondary | ICD-10-CM

## 2016-05-13 DIAGNOSIS — K219 Gastro-esophageal reflux disease without esophagitis: Secondary | ICD-10-CM

## 2016-05-13 LAB — PULMONARY FUNCTION TEST
DL/VA % pred: 80 %
DL/VA: 3.94 ml/min/mmHg/L
DLCO COR: 27.67 ml/min/mmHg
DLCO UNC: 28.42 ml/min/mmHg
DLCO cor % pred: 70 %
DLCO unc % pred: 72 %
FEF 25-75 PRE: 2.04 L/s
FEF 25-75 Post: 1.9 L/sec
FEF2575-%Change-Post: -7 %
FEF2575-%PRED-PRE: 54 %
FEF2575-%Pred-Post: 50 %
FEV1-%Change-Post: -1 %
FEV1-%PRED-POST: 64 %
FEV1-%PRED-PRE: 65 %
FEV1-POST: 2.91 L
FEV1-Pre: 2.95 L
FEV1FVC-%Change-Post: 0 %
FEV1FVC-%Pred-Pre: 88 %
FEV6-%CHANGE-POST: 1 %
FEV6-%PRED-PRE: 74 %
FEV6-%Pred-Post: 75 %
FEV6-Post: 4.22 L
FEV6-Pre: 4.18 L
FEV6FVC-%Change-Post: 0 %
FEV6FVC-%PRED-POST: 103 %
FEV6FVC-%Pred-Pre: 103 %
FVC-%Change-Post: -1 %
FVC-%PRED-POST: 73 %
FVC-%Pred-Pre: 73 %
FVC-POST: 4.26 L
FVC-Pre: 4.31 L
POST FEV6/FVC RATIO: 99 %
PRE FEV1/FVC RATIO: 68 %
Post FEV1/FVC ratio: 68 %
Pre FEV6/FVC Ratio: 100 %
RV % pred: 112 %
RV: 2.67 L
TLC % PRED: 89 %
TLC: 7.11 L

## 2016-05-13 NOTE — Progress Notes (Signed)
Test reviewed.  

## 2016-05-13 NOTE — Progress Notes (Signed)
Subjective:    Patient ID: Cody Ortega, male    DOB: May 27, 1960, 56 y.o.   MRN: [id number]  C.C.:  Follow-up for Moderate COPD/Asthma Overlap, Chronic Seasonal Allergic Rhinitis, & GERD.  HPI Moderate COPD/Asthma Overlap:  Started on Symbicort 80/4.5 at last appointment. He reports he thinks he had more confidence. He felt like his cough was more productive. He reports some intermittent wheezing, especially with the recent weather change. He reports he may recover quicker from his dyspnea with exertion. No nocturnal awakenings with any breathing problems. He reports he has rarely used his rescue inhaler - maybe every other day.  Chronic Seasonal Allergic Rhinitis:  He reports only minimal sinus congestion, pressure or drainage. He is continuing to use Flonase.   GERD: Patient's esophagram was essentially normal. No reflux or dyspepsia. No morning brash water taste.   Review of Systems He reports minimal chest tightness. No new chest pressure or pain. No fever or chills. Intermittent sweats.   Allergies  Allergen Reactions  . Codeine Itching and Nausea And Vomiting  . Hydrocodone-Acetaminophen Itching and Nausea And Vomiting    Current Outpatient Prescriptions on File Prior to Visit  Medication Sig Dispense Refill  . albuterol (VENTOLIN HFA) 108 (90 Base) MCG/ACT inhaler Inhale 2 puffs into the lungs every 6 (six) hours as needed for wheezing or shortness of breath. 8 g 11  . alprazolam (XANAX) 2 MG tablet Take 1-2 mg by mouth 4 (four) times daily as needed for anxiety.     Marland Kitchen amiodarone (PACERONE) 200 MG tablet Take 1 tablet (200 mg total) by mouth daily. 30 tablet 5  . amLODipine (NORVASC) 10 MG tablet TAKE 1 TABLET BY MOUTH EVERY DAY 30 tablet 11  . aspirin 81 MG EC tablet Take 81 mg by mouth daily.      . budesonide-formoterol (SYMBICORT) 80-4.5 MCG/ACT inhaler Inhale 2 puffs into the lungs 2 (two) times daily. 1 Inhaler 11  . carvedilol (COREG) 6.25 MG tablet TAKE 1 TABLET BY  MOUTH TWICE DAILY WITH A MEAL 180 tablet 2  . DULoxetine (CYMBALTA) 60 MG capsule Take 60 mg by mouth daily.    . ferrous sulfate 325 (65 FE) MG tablet Take 325 mg by mouth 2 (two) times daily with a meal. Reported on 06/26/2015    . hydrALAZINE (APRESOLINE) 25 MG tablet TAKE 1/2 TABLET BY MOUTH THREE TIMES DAILY 45 tablet 10  . Lifitegrast (XIIDRA) 5 % SOLN Apply 1 drop to eye 2 (two) times daily.    . mometasone (NASONEX) 50 MCG/ACT nasal spray Place 2 sprays into the nose daily.    . mometasone (NASONEX) 50 MCG/ACT nasal spray Place 2 sprays into the nose daily.    Marland Kitchen oxcarbazepine (TRILEPTAL) 600 MG tablet Take 2 tablets by mouth at night  1  . PAZEO 0.7 % SOLN Place 1 drop in each eye each morning  3  . potassium chloride SA (K-DUR,KLOR-CON) 20 MEQ tablet TAKE 2 TABLETS BY MOUTH DAILY 180 tablet 3  . pravastatin (PRAVACHOL) 40 MG tablet TAKE 1 TABLET BY MOUTH EVERY NIGHT AT BEDTIME 90 tablet 3  . sildenafil (VIAGRA) 100 MG tablet Take 50 mg by mouth daily as needed for erectile dysfunction. Reported on 07/09/2015    . Spacer/Aero-Holding Chambers (AEROCHAMBER MV) inhaler Use as instructed 1 each 0  . traZODone (DESYREL) 50 MG tablet Take 50 mg by mouth at bedtime as needed for sleep.     Alveda Reasons 20 MG TABS tablet TAKE 1  TABLET BY MOUTH EVERY DAY WITH DINNER 30 tablet 9  . furosemide (LASIX) 40 MG tablet TAKE 1 TABLET BY MOUTH TWICE DAILY AS NEEDED FOR WEIGHT OVER 300 LBS (Patient not taking: Reported on 05/13/2016) 60 tablet 11   No current facility-administered medications on file prior to visit.     Past Medical History:  Diagnosis Date  . Anxiety   . Benzodiazepine dependence ([place])   . Bronchial asthma   . CAD (coronary artery disease)    a. Cath 03/2010: mod RCA stenosis, severe diag stenosis, treated medically given lack of angina.  . 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 Chronic systolic congestive heart failure (HCC)    Acute decompensation, LVEF less than 20%  . CKD  (chronic kidney disease), stage IV (HCC)    Stage 3-4  . Depression   . History of cocaine abuse   . Hypertension   . Microcytic anemia   . PAF (paroxysmal atrial fibrillation) ([place])    a. Episode in 2008 with spontaneous conversion  . S/P implantation of automatic cardioverter/defibrillator (AICD)    a. Medtronic, implanted 2012.  . Stroke (cerebrum) ([place])   . Stroke [facility]) 2004  . Thyroid disease     Past Surgical History:  Procedure Laterality Date  . CARDIOVERSION N/A 01/07/2014   Procedure: CARDIOVERSION;  Surgeon: Lelon Perla, MD;  Location: [facility] ENDOSCOPY;  Service: Cardiovascular;  Laterality: N/A;  . CARDIOVERSION N/A 02/11/2014   Procedure: CARDIOVERSION;  Surgeon: Pixie Casino, MD;  Location: [facility] ENDOSCOPY;  Service: Cardiovascular;  Laterality: N/A;  . PACEMAKER INSERTION    . TEE WITHOUT CARDIOVERSION N/A 01/07/2014   Procedure: TRANSESOPHAGEAL ECHOCARDIOGRAM (TEE);  Surgeon: Lelon Perla, MD;  Location: [facility] ENDOSCOPY;  Service: Cardiovascular;  Laterality: N/A;  . TEE WITHOUT CARDIOVERSION N/A 02/11/2014   Procedure: TRANSESOPHAGEAL ECHOCARDIOGRAM (TEE);  Surgeon: Pixie Casino, MD;  Location: [facility] ENDOSCOPY;  Service: Cardiovascular;  Laterality: N/A;  . TONSILLECTOMY    . TRANSTHORACIC ECHOCARDIOGRAM  10/2006, 12/2009    Family History  Problem Relation Age of Onset  . Breast cancer Mother   . Multiple myeloma Mother   . Prostate cancer Father   . Diabetes Father   . Peripheral vascular disease Father   . Cerebral palsy Daughter   . Lung disease Neg Hx     Social History   Social History  . Marital status: Divorced    Spouse name: N/A  . Number of children: N/A  . Years of education: N/A   Occupational History  . Unemployed Unemployed    basketball referee in the past   Social History Main Topics  . Smoking status: Former Research scientist (life sciences)  . Smokeless tobacco: Never Used     Comment: Significant Second-hand and only 3 months himself  . Alcohol use 0.0  oz/week     Comment: occ  . Drug use: No     Comment: Reported history of cocaine abuse off and on   . Sexual activity: Not Asked   Other Topics Concern  . None   Social History Narrative   Living with a son who is a 25 year old.  He is not     working, unemployed for 3 years.  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  Denied use of alcohol.  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       [facility]:   Originally from [place], [place]. He grew up in  [place]. He moved to [place] in 1985. 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 No pets currently. No bird exposure. He did have mold in a previous home. No hot tub exposure.            Objective:   Physical Exam BP 134/72   Pulse 63   Ht '6\' 3"'$  (1.905 m)   Wt (!) 308 lb (139.7 kg)   SpO2 96%   BMI 38.50 kg/m   Gen.: Obese male. No distress. Appears comfortable. Integument: No bruising or rash on exposed skin. No icterus. Lymphatics: No supraclavicular or cervical adenopathy appreciated. Extremities: No cyanosis or clubbing. Cardiovascular: Regular rate & rhythm. No edema. Normal S1 & S2. HEENT: Minimal nasal turbinate swelling bilaterally. No oral ulcers. Moist mucous membranes. Pulmonary: Good aeration bilaterally. Clear to auscultation. No accessory muscle use. Is able to induce wheeze with forced expiration. Abdomen: Soft. Protuberant. Nontender.  PFT 05/13/16: FVC 4.31 L (73%) FEV1 2.95 L (65%) FEV1/FVC 0.68 FEF 25-75 2.04 L (54%) negative bronchodilator response TLC 7.11 L (89%) RV 112% ERV 30% DLCO corrected 70% (Hgb 15.6) 07/30/14: FVC 4.19 L (83%) FEV1 3.36 L (74%) FEV1/FVC 0.68 FEF 25-75 2.34 L (60%)                                                      TLC 7.96 L (99%) RV 122% ERV 8% DLCO uncorrected 80%  6MWT 05/13/16:  Walked 468 meters / Baseline Sat 96% on RA / Nadir Sat 96% on RA  IMAGING ESOPHAGRAM/BARIUM SWALLOW 03/29/16 (per  radiologist): Normal esophagogram without abnormal motility or hiatal hernia.  CXR PA/LAT 10/12/15 (previously reviewed by me):  No focal opacity or mass appreciated. No pleural effusion. Heart normal in size & mediastinum normal in contour.   LABS 03/31/16 Alpha-1 antitrypsin: MM (170)  12/25/13 UDS:  Positive for Benzodiazepines & THC    Assessment & Plan:  56 y.o. male with moderate COPD/asthma overlap, chronic allergic rhinitis, & GERD. Symptomatically the patient's underlying COPD/asthma seems to be well controlled. His spirometry today does show moderate airway obstruction with no significant bronchodilator response leading me to believe that adjusting his current inhaled medication may be of little to no symptomatic benefit. Additionally, patient's reflux seems to be minimal. Currently his allergic rhinitis is well-controlled on his current regimen. I instructed the patient contact my office if he had any new breathing problems or questions before his next appointment.  1. Moderate COPD/Asthma Overlap: Symptomatically well controlled with Symbicort 80/4.5. No changes at this time. Consider increasing dose of inhaled corticosteroid depending upon symptom control as the season changes. 2. Chronic Seasonal Allergic Rhinitis: Well controlled with Nasonex. No changes. 3. GERD: Minimal symptoms. No new medications at this time. 4. Health Maintenance: Patient declines immunizations at this time. 5. Follow-up: Patient to return to clinic in 3 months or sooner if needed.  Sonia Baller Ashok Cordia, M.D. [facility] Pager:  [phone] After 3pm or if no response, call [phone] 1:55 PM 05/13/16

## 2016-05-13 NOTE — Telephone Encounter (Signed)
IMAGING ESOPHAGRAM/BARIUM SWALLOW 03/29/16 (per radiologist): Normal esophagogram without abnormal motility or hiatal hernia.  LABS 03/31/16 Alpha-1 antitrypsin: MM (170)

## 2016-05-13 NOTE — Patient Instructions (Signed)
   Continue using your Symbicort inhaler twice daily as prescribed.  Call me if you have any new breathing problems or questions before your next appointment.  I will see you back in 3 months or sooner if needed.

## 2016-05-24 ENCOUNTER — Other Ambulatory Visit: Payer: Self-pay

## 2016-05-24 DIAGNOSIS — R748 Abnormal levels of other serum enzymes: Secondary | ICD-10-CM

## 2016-06-02 ENCOUNTER — Other Ambulatory Visit: Payer: Medicaid Other

## 2016-06-16 ENCOUNTER — Other Ambulatory Visit: Payer: Self-pay | Admitting: Cardiovascular Disease

## 2016-06-16 DIAGNOSIS — I5022 Chronic systolic (congestive) heart failure: Secondary | ICD-10-CM

## 2016-06-16 DIAGNOSIS — R059 Cough, unspecified: Secondary | ICD-10-CM

## 2016-06-16 DIAGNOSIS — R05 Cough: Secondary | ICD-10-CM

## 2016-06-16 DIAGNOSIS — E785 Hyperlipidemia, unspecified: Secondary | ICD-10-CM

## 2016-06-16 DIAGNOSIS — R0602 Shortness of breath: Secondary | ICD-10-CM

## 2016-06-16 NOTE — Telephone Encounter (Signed)
Medication Detail    Disp Refills Start End   amiodarone (PACERONE) 200 MG tablet 30 tablet 5 05/09/2016    Sig - Route: Take 1 tablet (200 mg total) by mouth daily. - Oral   E-Prescribing Status: Receipt confirmed by pharmacy (05/09/2016 3:02 PM EST)   Associated Diagnoses   SOB (shortness of breath)     Cough     Chronic systolic heart failure ([place])     Pharmacy   [id number] - [place], [place] [address]

## 2016-06-29 ENCOUNTER — Ambulatory Visit
Admission: RE | Admit: 2016-06-29 | Discharge: 2016-06-29 | Disposition: A | Discharge status: Home / Self Care | Payer: Medicaid Other | Source: Ambulatory Visit | Attending: Cardiovascular Disease | Admitting: Cardiovascular Disease

## 2016-06-29 DIAGNOSIS — R748 Abnormal levels of other serum enzymes: Secondary | ICD-10-CM

## 2016-07-01 ENCOUNTER — Other Ambulatory Visit: Payer: Self-pay | Admitting: Cardiovascular Disease

## 2016-07-01 ENCOUNTER — Ambulatory Visit (INDEPENDENT_AMBULATORY_CARE_PROVIDER_SITE_OTHER): Payer: Medicaid Other | Admitting: Internal Medicine

## 2016-07-01 ENCOUNTER — Other Ambulatory Visit: Payer: Self-pay

## 2016-07-01 ENCOUNTER — Encounter: Payer: Self-pay | Admitting: Internal Medicine

## 2016-07-01 VITALS — BP 136/82 | HR 70 | Ht 75.0 in | Wt 306.0 lb

## 2016-07-01 DIAGNOSIS — Z95810 Presence of automatic (implantable) cardiac defibrillator: Secondary | ICD-10-CM

## 2016-07-01 DIAGNOSIS — I5022 Chronic systolic (congestive) heart failure: Secondary | ICD-10-CM

## 2016-07-01 DIAGNOSIS — I10 Essential (primary) hypertension: Secondary | ICD-10-CM

## 2016-07-01 DIAGNOSIS — I480 Paroxysmal atrial fibrillation: Secondary | ICD-10-CM | POA: Diagnosis not present

## 2016-07-01 LAB — CUP PACEART INCLINIC DEVICE CHECK
Brady Statistic RV Percent Paced: 0.07 %
Date Time Interrogation Session: 20180302130609
HIGH POWER IMPEDANCE MEASURED VALUE: 304 Ohm
HIGH POWER IMPEDANCE MEASURED VALUE: 45 Ohm
HighPow Impedance: 56 Ohm
Implantable Lead Implant Date: 20120806
Implantable Lead Model: 185
Implantable Lead Serial Number: 345870
Lead Channel Impedance Value: 323 Ohm
Lead Channel Pacing Threshold Amplitude: 1.25 V
Lead Channel Pacing Threshold Pulse Width: 0.4 ms
Lead Channel Sensing Intrinsic Amplitude: 13.375 mV
Lead Channel Sensing Intrinsic Amplitude: 9.125 mV
Lead Channel Setting Sensing Sensitivity: 0.3 mV
MDC IDC LEAD LOCATION: 753860
MDC IDC MSMT BATTERY VOLTAGE: 3.01 V
MDC IDC PG IMPLANT DT: 20120806
MDC IDC SET LEADCHNL RV PACING AMPLITUDE: 2.5 V
MDC IDC SET LEADCHNL RV PACING PULSEWIDTH: 0.4 ms

## 2016-07-01 MED ORDER — SILDENAFIL CITRATE 20 MG PO TABS: ORAL_TABLET | ORAL | 1 refills | Status: DC

## 2016-07-01 NOTE — Telephone Encounter (Signed)
The maximum we can authorize is 100 tablets with 1 additional refill at this time.  Please make pt aware and notify pharmacy if the pt has not already picked up his prescription.

## 2016-07-01 NOTE — Telephone Encounter (Signed)
Pt is requesting a 90 day supply and what was prescribed was not enough dispensed. Please advise

## 2016-07-01 NOTE — Patient Instructions (Addendum)
Medication Instructions:  Your physician recommends that you continue on your current medications as directed. Please refer to the Current Medication list given to you today.   Labwork: None Ordered   Testing/Procedures: None Ordered   Follow-Up: Your physician wants you to follow-up in: 3 months in [facility] and 1 year with Dr. Lovena Le.  You will receive a reminder letter in the mail two months in advance. If you don't receive a letter, please call our office to schedule the follow-up appointment.   Any Other Special Instructions Will Be Listed Below (If Applicable).     If you need a refill on your cardiac medications before your next appointment, please call your pharmacy.

## 2016-07-01 NOTE — Progress Notes (Signed)
HPI Cody Ortega returns today for followup. He is a 56 year old man with a history of of ischemic as well as nonischemic cardiomyopathy, class II systolic heart failure, recently diagnosed atrial fibrillation with a rapid ventricular response, and depression. He was hospitalized with atrial fibrillation over a year ago and ultimately underwent DCCV and has maintained NSR on amiodarone for the past year.  He denies medical noncompliance. He states specifically that he is taking his medicine daily. He admits to hip pain and was diagnosed with sciatica. No other complaints today. He still plays volleyball but is very sedentary. Allergies  Allergen Reactions  . Codeine Itching and Nausea And Vomiting  . Hydrocodone-Acetaminophen Itching and Nausea And Vomiting     Current Outpatient Prescriptions  Medication Sig Dispense Refill  . albuterol (VENTOLIN HFA) 108 (90 Base) MCG/ACT inhaler Inhale 2 puffs into the lungs every 6 (six) hours as needed for wheezing or shortness of breath. 8 g 11  . alprazolam (XANAX) 2 MG tablet Take 1-2 mg by mouth 4 (four) times daily as needed for anxiety.     Marland Kitchen amiodarone (PACERONE) 200 MG tablet Take 1 tablet (200 mg total) by mouth daily. 30 tablet 5  . amLODipine (NORVASC) 10 MG tablet TAKE 1 TABLET BY MOUTH EVERY DAY 30 tablet 11  . aspirin 81 MG EC tablet Take 81 mg by mouth daily.      . budesonide-formoterol (SYMBICORT) 80-4.5 MCG/ACT inhaler Inhale 2 puffs into the lungs 2 (two) times daily. 1 Inhaler 11  . carvedilol (COREG) 6.25 MG tablet TAKE 1 TABLET BY MOUTH TWICE DAILY WITH A MEAL 180 tablet 2  . DULoxetine (CYMBALTA) 60 MG capsule Take 60 mg by mouth daily.    . hydrALAZINE (APRESOLINE) 25 MG tablet TAKE 1/2 TABLET BY MOUTH THREE TIMES DAILY 45 tablet 10  . Lifitegrast (XIIDRA) 5 % SOLN Apply 1 drop to eye 2 (two) times daily.    . mometasone (NASONEX) 50 MCG/ACT nasal spray Place 2 sprays into the nose daily.    Marland Kitchen oxcarbazepine (TRILEPTAL) 600  MG tablet Take 2 tablets by mouth at night  1  . PAZEO 0.7 % SOLN Place 1 drop in each eye each morning  3  . potassium chloride SA (K-DUR,KLOR-CON) 20 MEQ tablet TAKE 2 TABLETS BY MOUTH DAILY 180 tablet 3  . pravastatin (PRAVACHOL) 40 MG tablet TAKE 1 TABLET BY MOUTH EVERY NIGHT AT BEDTIME 90 tablet 3  . sildenafil (VIAGRA) 100 MG tablet Take 50 mg by mouth daily as needed for erectile dysfunction. Reported on 07/09/2015    . Spacer/Aero-Holding Chambers (AEROCHAMBER MV) inhaler Use as instructed 1 each 0  . traZODone (DESYREL) 50 MG tablet Take 50 mg by mouth at bedtime as needed for sleep.     Alveda Reasons 20 MG TABS tablet TAKE 1 TABLET BY MOUTH EVERY DAY WITH DINNER 30 tablet 9  . ferrous sulfate 325 (65 FE) MG tablet Take 325 mg by mouth 2 (two) times daily with a meal. Reported on 06/26/2015    . furosemide (LASIX) 40 MG tablet TAKE 1 TABLET BY MOUTH TWICE DAILY AS NEEDED FOR WEIGHT OVER 300 LBS (Patient not taking: Reported on 05/13/2016) 60 tablet 11   No current facility-administered medications for this visit.      Past Medical History:  Diagnosis Date  . Anxiety   . Benzodiazepine dependence ([place])   . Bronchial asthma   . CAD (coronary artery disease)    a. Cath 03/2010:  mod RCA stenosis, severe diag stenosis, treated medically given lack of angina.  . 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 Chronic systolic congestive heart failure (HCC)    Acute decompensation, LVEF less than 20%  . CKD (chronic kidney disease), stage IV (HCC)    Stage 3-4  . Depression   . History of cocaine abuse   . Hypertension   . Microcytic anemia   . PAF (paroxysmal atrial fibrillation) ([place])    a. Episode in 2008 with spontaneous conversion  . S/P implantation of automatic cardioverter/defibrillator (AICD)    a. Medtronic, implanted 2012.  . Stroke (cerebrum) ([place])   . Stroke [facility]) 2004  . Thyroid disease     ROS:   All systems reviewed and negative except as noted in the HPI.   Past  Surgical History:  Procedure Laterality Date  . CARDIOVERSION N/A 01/07/2014   Procedure: CARDIOVERSION;  Surgeon: Lelon Perla, MD;  Location: [facility] ENDOSCOPY;  Service: Cardiovascular;  Laterality: N/A;  . CARDIOVERSION N/A 02/11/2014   Procedure: CARDIOVERSION;  Surgeon: Pixie Casino, MD;  Location: [facility] ENDOSCOPY;  Service: Cardiovascular;  Laterality: N/A;  . PACEMAKER INSERTION    . TEE WITHOUT CARDIOVERSION N/A 01/07/2014   Procedure: TRANSESOPHAGEAL ECHOCARDIOGRAM (TEE);  Surgeon: Lelon Perla, MD;  Location: [facility] ENDOSCOPY;  Service: Cardiovascular;  Laterality: N/A;  . TEE WITHOUT CARDIOVERSION N/A 02/11/2014   Procedure: TRANSESOPHAGEAL ECHOCARDIOGRAM (TEE);  Surgeon: Pixie Casino, MD;  Location: [facility] ENDOSCOPY;  Service: Cardiovascular;  Laterality: N/A;  . TONSILLECTOMY    . TRANSTHORACIC ECHOCARDIOGRAM  10/2006, 12/2009     Family History  Problem Relation Age of Onset  . Breast cancer Mother   . Multiple myeloma Mother   . Prostate cancer Father   . Diabetes Father   . Peripheral vascular disease Father   . Cerebral palsy Daughter   . Lung disease Neg Hx      Social History   Social History  . Marital status: Divorced    Spouse name: N/A  . Number of children: N/A  . Years of education: N/A   Occupational History  . Unemployed Unemployed    basketball referee in the past   Social History Main Topics  . Smoking status: Former Research scientist (life sciences)  . Smokeless tobacco: Never Used     Comment: Significant Second-hand and only 3 months himself  . Alcohol use 0.0 oz/week     Comment: occ  . Drug use: No     Comment: Reported history of cocaine abuse off and on   . Sexual activity: Not on file   Other Topics Concern  . Not on file   Social History Narrative   Living with a son who is a 51 year old.  He is not     working, unemployed for 3 years.  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  Denied use of alcohol.  History of cocaine  abuse on  and off reported.       [facility]:   Originally from [place], [place]. He grew up in [place]. He moved to [place] in 1985. 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 No pets currently. No bird exposure. He did have mold in a previous home. No hot tub exposure.           BP 136/82   Pulse 70   Ht '6\' 3"'$  (1.905 m)   Wt Marland Kitchen)  306 lb (138.8 kg)   BMI 38.25 kg/m   Physical Exam: Diskempt appearing middle-aged man, NAD HEENT: Unremarkable Neck:  No JVD, no thyromegally Back:  No CVA tenderness Lungs:  Clear except for basilar rales HEART:  IRegular rate rhythm, no murmurs, no rubs, no clicks Abd:  soft, positive bowel sounds, no organomegally, no rebound, no guarding Ext:  2 plus pulses, no edema, no cyanosis, no clubbing Skin:  No rashes no nodules Neuro:  CN II through XII intact, motor grossly intact   DEVICE  Normal device function.  See PaceArt for details.   Assess/Plan:

## 2016-07-26 ENCOUNTER — Ambulatory Visit
Admission: RE | Admit: 2016-07-26 | Discharge: 2016-07-26 | Disposition: A | Discharge status: Home / Self Care | Payer: Medicaid Other | Source: Ambulatory Visit | Attending: Cardiovascular Disease | Admitting: Cardiovascular Disease

## 2016-07-26 DIAGNOSIS — I480 Paroxysmal atrial fibrillation: Secondary | ICD-10-CM

## 2016-07-26 DIAGNOSIS — Z79899 Other long term (current) drug therapy: Secondary | ICD-10-CM

## 2016-07-26 DIAGNOSIS — R0602 Shortness of breath: Secondary | ICD-10-CM

## 2016-08-12 ENCOUNTER — Encounter: Payer: Self-pay | Admitting: Pulmonary Disease

## 2016-08-12 ENCOUNTER — Ambulatory Visit (INDEPENDENT_AMBULATORY_CARE_PROVIDER_SITE_OTHER): Payer: Medicaid Other | Admitting: Pulmonary Disease

## 2016-08-12 VITALS — BP 130/90 | HR 65 | Ht 75.0 in | Wt 299.8 lb

## 2016-08-12 DIAGNOSIS — J302 Other seasonal allergic rhinitis: Secondary | ICD-10-CM

## 2016-08-12 DIAGNOSIS — J449 Chronic obstructive pulmonary disease, unspecified: Secondary | ICD-10-CM | POA: Diagnosis not present

## 2016-08-12 MED ORDER — BUDESONIDE-FORMOTEROL FUMARATE 160-4.5 MCG/ACT IN AERO: 2.0000 | INHALATION_SPRAY | Freq: Two times a day (BID) | RESPIRATORY_TRACT | 0 refills | Status: DC

## 2016-08-12 NOTE — Progress Notes (Signed)
Subjective:    Patient ID: Cody Ortega, male    DOB: 06-Nov-1960, 56 y.o.   MRN: [id number]  C.C.:  Follow-up for Moderate COPD/Asthma Overlap, Chronic Seasonal Allergic Rhinitis, & GERD.  HPI Moderate COPD/asthma overlap: Prescribed Symbicort 80/4.5. He reports he has had more coughing that is nonproductive & intermittent wheezing. No exacerbations since last appointment. No nocturnal awakenings with any breathing problems. Using his rescue medication only twice this week.   Chronic seasonal allergic rhinitis:  Currently using Nasonex. He has some increased since congestion & pressure.   GERD: Previously normal esophagogram. Not currently on medication. He reports only minimal reflux. No dyspepsia.   Review of Systems No chest pain, tightness, or pressure. No fever or chills. No abdominal pain or nausea.   Allergies  Allergen Reactions  . Codeine Itching and Nausea And Vomiting  . Hydrocodone-Acetaminophen Itching and Nausea And Vomiting    Current Outpatient Prescriptions on File Prior to Visit  Medication Sig Dispense Refill  . albuterol (VENTOLIN HFA) 108 (90 Base) MCG/ACT inhaler Inhale 2 puffs into the lungs every 6 (six) hours as needed for wheezing or shortness of breath. 8 g 11  . alprazolam (XANAX) 2 MG tablet Take 1-2 mg by mouth 4 (four) times daily as needed for anxiety.     Marland Kitchen amiodarone (PACERONE) 200 MG tablet Take 1 tablet (200 mg total) by mouth daily. 30 tablet 5  . amLODipine (NORVASC) 10 MG tablet TAKE 1 TABLET BY MOUTH EVERY DAY 30 tablet 11  . aspirin 81 MG EC tablet Take 81 mg by mouth daily.      . budesonide-formoterol (SYMBICORT) 80-4.5 MCG/ACT inhaler Inhale 2 puffs into the lungs 2 (two) times daily. 1 Inhaler 11  . carvedilol (COREG) 6.25 MG tablet TAKE 1 TABLET BY MOUTH TWICE DAILY WITH A MEAL 180 tablet 2  . DULoxetine (CYMBALTA) 60 MG capsule Take 60 mg by mouth daily.    . ferrous sulfate 325 (65 FE) MG tablet Take 325 mg by mouth 2 (two) times  daily with a meal. Reported on 06/26/2015    . furosemide (LASIX) 40 MG tablet TAKE 1 TABLET BY MOUTH TWICE DAILY AS NEEDED FOR WEIGHT OVER 300 LBS 60 tablet 11  . hydrALAZINE (APRESOLINE) 25 MG tablet TAKE 1/2 TABLET BY MOUTH THREE TIMES DAILY 45 tablet 10  . mometasone (NASONEX) 50 MCG/ACT nasal spray Place 2 sprays into the nose daily.    Marland Kitchen oxcarbazepine (TRILEPTAL) 600 MG tablet Take 2 tablets by mouth at night  1  . PAZEO 0.7 % SOLN Place 1 drop in each eye each morning  3  . potassium chloride SA (K-DUR,KLOR-CON) 20 MEQ tablet TAKE 2 TABLETS BY MOUTH DAILY 180 tablet 3  . pravastatin (PRAVACHOL) 40 MG tablet TAKE 1 TABLET BY MOUTH EVERY NIGHT AT BEDTIME 90 tablet 3  . sildenafil (REVATIO) 20 MG tablet TAKE 2 TO 5 TABLETS BY MOUTH EVERY DAY AS NEEDED PRIOR TO SEXUAL ACTIVITY 100 tablet 1  . Spacer/Aero-Holding Chambers (AEROCHAMBER MV) inhaler Use as instructed 1 each 0  . traZODone (DESYREL) 50 MG tablet Take 50 mg by mouth at bedtime as needed for sleep.     Alveda Reasons 20 MG TABS tablet TAKE 1 TABLET BY MOUTH EVERY DAY WITH DINNER 30 tablet 9  . Lifitegrast (XIIDRA) 5 % SOLN Apply 1 drop to eye 2 (two) times daily.     No current facility-administered medications on file prior to visit.     Past Medical  History:  Diagnosis Date  . Anxiety   . Benzodiazepine dependence ([place])   . Bronchial asthma   . CAD (coronary artery disease)    a. Cath 03/2010: mod RCA stenosis, severe diag stenosis, treated medically given lack of angina.  . 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 Chronic systolic congestive heart failure (HCC)    Acute decompensation, LVEF less than 20%  . CKD (chronic kidney disease), stage IV (HCC)    Stage 3-4  . Depression   . History of cocaine abuse   . Hypertension   . Microcytic anemia   . PAF (paroxysmal atrial fibrillation) ([place])    a. Episode in 2008 with spontaneous conversion  . S/P implantation of automatic cardioverter/defibrillator (AICD)    a.  Medtronic, implanted 2012.  . Stroke (cerebrum) ([place])   . Stroke [facility]) 2004  . Thyroid disease     Past Surgical History:  Procedure Laterality Date  . CARDIOVERSION N/A 01/07/2014   Procedure: CARDIOVERSION;  Surgeon: Lelon Perla, MD;  Location: [facility] ENDOSCOPY;  Service: Cardiovascular;  Laterality: N/A;  . CARDIOVERSION N/A 02/11/2014   Procedure: CARDIOVERSION;  Surgeon: Pixie Casino, MD;  Location: [facility] ENDOSCOPY;  Service: Cardiovascular;  Laterality: N/A;  . PACEMAKER INSERTION    . TEE WITHOUT CARDIOVERSION N/A 01/07/2014   Procedure: TRANSESOPHAGEAL ECHOCARDIOGRAM (TEE);  Surgeon: Lelon Perla, MD;  Location: [facility] ENDOSCOPY;  Service: Cardiovascular;  Laterality: N/A;  . TEE WITHOUT CARDIOVERSION N/A 02/11/2014   Procedure: TRANSESOPHAGEAL ECHOCARDIOGRAM (TEE);  Surgeon: Pixie Casino, MD;  Location: [facility] ENDOSCOPY;  Service: Cardiovascular;  Laterality: N/A;  . TONSILLECTOMY    . TRANSTHORACIC ECHOCARDIOGRAM  10/2006, 12/2009    Family History  Problem Relation Age of Onset  . Breast cancer Mother   . Multiple myeloma Mother   . Prostate cancer Father   . Diabetes Father   . Peripheral vascular disease Father   . Cerebral palsy Daughter   . Lung disease Neg Hx     Social History   Social History  . Marital status: Divorced    Spouse name: N/A  . Number of children: N/A  . Years of education: N/A   Occupational History  . Unemployed Unemployed    basketball referee in the past   Social History Main Topics  . Smoking status: Former Research scientist (life sciences)  . Smokeless tobacco: Never Used     Comment: Significant Second-hand and only 3 months himself  . Alcohol use 0.0 oz/week     Comment: occ  . Drug use: No     Comment: Reported history of cocaine abuse off and on   . Sexual activity: Not Asked   Other Topics Concern  . None   Social History Narrative   Living with a son who is a 77 year old.  He is not     working, unemployed for 3 years.  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  Denied use of alcohol.  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       [facility]:   Originally from [place], [place]. He grew up in [place]. He moved to [place] in 1985. 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 No pets currently. No bird exposure. He did have mold in a previous home. No hot tub exposure.            Objective:   Physical Exam BP  130/90 (BP Location: Right Arm, Patient Position: Sitting, Cuff Size: Large)   Pulse 65   Ht '6\' 3"'$  (1.905 m)   Wt 299 lb 12.8 oz (136 kg)   SpO2 98%   BMI 37.47 kg/m   Gen.: Obese. No acute distress. Comfortable. Integument: No rash or bruising on exposed skin. Warm and dry. Cardiovascular: Regular rate. Regular rhythm. No edema. Pulmonary: Good aeration bilaterally. Minimal squeaks bilaterally. Wheezing on forced exhalation. Intermittent nonproductive cough witness. Abdomen: Soft. Protuberant. Normal bowel sounds. HEENT: Moist mucous membranes. Mild bilateral nasal turbinate swelling. No oral ulcers.  PFT 05/13/16: FVC 4.31 L (73%) FEV1 2.95 L (65%) FEV1/FVC 0.68 FEF 25-75 2.04 L (54%) negative bronchodilator response TLC 7.11 L (89%) RV 112% ERV 30% DLCO corrected 70% (Hgb 15.6) 07/30/14: FVC 4.19 L (83%) FEV1 3.36 L (74%) FEV1/FVC 0.68 FEF 25-75 2.34 L (60%)                                                      TLC 7.96 L (99%) RV 122% ERV 8% DLCO uncorrected 80%  6MWT 05/13/16:  Walked 468 meters / Baseline Sat 96% on RA / Nadir Sat 96% on RA  IMAGING ESOPHAGRAM/BARIUM SWALLOW 03/29/16 (per radiologist): Normal esophagogram without abnormal motility or hiatal hernia.  CXR PA/LAT 10/12/15 (previously reviewed by me):  No focal opacity or mass appreciated. No pleural effusion. Heart normal in size & mediastinum normal in contour.   LABS 03/31/16 Alpha-1 antitrypsin: MM (170)  12/25/13 UDS:  Positive for Benzodiazepines & THC    Assessment  & Plan:  56 y.o. male with moderate COPD/asthma overlap, chronic seasonal allergic rhinitis, & GERD. Patient's reflux is well-controlled at this time. He is having increased coughing likely secondary to his underlying asthma with high pollen counts. May benefit from increased inhaled corticosteroid. Overall rhinitis seems to be reasonably well controlled.  1. Moderate COPD/asthma overlap: Patient given sample of Symbicort 160/4.5. He will notify my clinic of the effects. 2. Chronic seasonal allergic rhinitis: Continuing Nasonex nasal spray. Educated on proper technique. 3. GERD: No new medications. 4. Health maintenance: Previously declined immunizations. 5. Follow-up: Return to clinic in 3 months or sooner if needed.  Sonia Baller Ashok Cordia, M.D. [facility] Pager:  [phone] After 3pm or if no response, call [phone] 1:46 PM 08/12/16

## 2016-08-12 NOTE — Patient Instructions (Signed)
   Try using the Symbicort 160/4.5 in place of your current inhaler for the next 1-2 weeks - inhale 2 puffs twice daily.  Call me if this seems to have helped your cough/breathing & we will send in a prescription. Otherwise we will need to try something else.  I will see you back in 3 months or sooner if needed.

## 2016-08-22 ENCOUNTER — Other Ambulatory Visit: Payer: Medicaid Other

## 2016-08-23 ENCOUNTER — Encounter (INDEPENDENT_AMBULATORY_CARE_PROVIDER_SITE_OTHER): Payer: Self-pay

## 2016-08-23 ENCOUNTER — Other Ambulatory Visit: Payer: Medicaid Other | Admitting: *Deleted

## 2016-08-23 DIAGNOSIS — R748 Abnormal levels of other serum enzymes: Secondary | ICD-10-CM

## 2016-08-23 LAB — HEPATIC FUNCTION PANEL
ALBUMIN: 4.2 g/dL (ref 3.5–5.5)
ALK PHOS: 85 IU/L (ref 39–117)
ALT: 109 IU/L — ABNORMAL HIGH (ref 0–44)
AST: 65 IU/L — ABNORMAL HIGH (ref 0–40)
BILIRUBIN, DIRECT: 0.11 mg/dL (ref 0.00–0.40)
Bilirubin Total: 0.4 mg/dL (ref 0.0–1.2)
TOTAL PROTEIN: 7.7 g/dL (ref 6.0–8.5)

## 2016-09-14 ENCOUNTER — Encounter: Payer: Self-pay | Admitting: Family Medicine

## 2016-09-22 ENCOUNTER — Encounter: Payer: Self-pay | Admitting: Cardiovascular Disease

## 2016-10-03 ENCOUNTER — Ambulatory Visit (INDEPENDENT_AMBULATORY_CARE_PROVIDER_SITE_OTHER): Payer: Medicaid Other | Admitting: *Deleted

## 2016-10-03 ENCOUNTER — Other Ambulatory Visit: Payer: Medicaid Other | Admitting: *Deleted

## 2016-10-03 DIAGNOSIS — I255 Ischemic cardiomyopathy: Secondary | ICD-10-CM

## 2016-10-03 DIAGNOSIS — I10 Essential (primary) hypertension: Secondary | ICD-10-CM

## 2016-10-03 DIAGNOSIS — I5022 Chronic systolic (congestive) heart failure: Secondary | ICD-10-CM

## 2016-10-03 DIAGNOSIS — Z95810 Presence of automatic (implantable) cardiac defibrillator: Secondary | ICD-10-CM | POA: Diagnosis not present

## 2016-10-03 DIAGNOSIS — I480 Paroxysmal atrial fibrillation: Secondary | ICD-10-CM

## 2016-10-03 LAB — COMPREHENSIVE METABOLIC PANEL
ALBUMIN: 4.1 g/dL (ref 3.5–5.5)
ALK PHOS: 77 IU/L (ref 39–117)
ALT: 65 IU/L — ABNORMAL HIGH (ref 0–44)
AST: 53 IU/L — ABNORMAL HIGH (ref 0–40)
Albumin/Globulin Ratio: 1.3 (ref 1.2–2.2)
BILIRUBIN TOTAL: 0.5 mg/dL (ref 0.0–1.2)
BUN / CREAT RATIO: 13 (ref 9–20)
BUN: 24 mg/dL (ref 6–24)
CHLORIDE: 102 mmol/L (ref 96–106)
CO2: 21 mmol/L (ref 18–29)
Calcium: 9 mg/dL (ref 8.7–10.2)
Creatinine, Ser: 1.84 mg/dL — ABNORMAL HIGH (ref 0.76–1.27)
GFR calc Af Amer: 47 mL/min/{1.73_m2} — ABNORMAL LOW (ref >59)
GFR calc non Af Amer: 41 mL/min/{1.73_m2} — ABNORMAL LOW (ref >59)
Globulin, Total: 3.1 g/dL (ref 1.5–4.5)
Glucose: 91 mg/dL (ref 65–99)
POTASSIUM: 3.6 mmol/L (ref 3.5–5.2)
SODIUM: 141 mmol/L (ref 134–144)
Total Protein: 7.2 g/dL (ref 6.0–8.5)

## 2016-10-03 LAB — LIPID PANEL
CHOLESTEROL TOTAL: 156 mg/dL (ref 100–199)
Chol/HDL Ratio: 3.6 ratio (ref 0.0–5.0)
HDL: 43 mg/dL (ref >39)
LDL Calculated: 91 mg/dL (ref 0–99)
Triglycerides: 112 mg/dL (ref 0–149)
VLDL CHOLESTEROL CAL: 22 mg/dL (ref 5–40)

## 2016-10-03 LAB — CUP PACEART INCLINIC DEVICE CHECK
Brady Statistic RV Percent Paced: 0.03 %
Date Time Interrogation Session: 20180604114046
HIGH POWER IMPEDANCE MEASURED VALUE: 46 Ohm
HighPow Impedance: 323 Ohm
HighPow Impedance: 57 Ohm
Implantable Lead Implant Date: 20120806
Implantable Lead Location: 753860
Implantable Lead Model: 185
Implantable Lead Serial Number: 345870
Implantable Pulse Generator Implant Date: 20120806
Lead Channel Impedance Value: 342 Ohm
Lead Channel Pacing Threshold Pulse Width: 0.4 ms
Lead Channel Setting Sensing Sensitivity: 0.3 mV
MDC IDC MSMT BATTERY VOLTAGE: 3.01 V
MDC IDC MSMT LEADCHNL RV PACING THRESHOLD AMPLITUDE: 1.25 V
MDC IDC MSMT LEADCHNL RV SENSING INTR AMPL: 13 mV
MDC IDC SET LEADCHNL RV PACING AMPLITUDE: 2.5 V
MDC IDC SET LEADCHNL RV PACING PULSEWIDTH: 0.4 ms

## 2016-10-03 LAB — TSH: TSH: 1.5 u[IU]/mL (ref 0.450–4.500)

## 2016-10-03 NOTE — Progress Notes (Addendum)
ICD check in clinic. Normal device function. Thresholds and sensing consistent with previous device measurements. Impedance trends stable over time. No evidence of any ventricular arrhythmias. Histogram distribution appropriate for patient and level of activity. Thoracic impedance stable at this time. No changes made this session. Device programmed at appropriate safety margins. Device programmed to optimize intrinsic conduction. Battery voltage 3.01V, RRT at 2.63V, patient aware of auditory alert tone. Patient declines Carelink monitoring. Patient education completed including shock plan. ROV with Device Clinic on 01/04/17 and ROV with [facility] in 06/2017.

## 2016-10-03 NOTE — Progress Notes (Deleted)
Wound Loop check in clinic. Steri-Strips removed, incision edges approximated. No redness or swelling. Wound well healed. Battery status: Good. R-waves 0.75mV. 1 symptom episodes ~ from implant, 0 tachy episodes, 0 pause episodes, 0 brady episodes. 0 AF episodes. Monthly summary reports and ROV with [facility] PRN.

## 2016-10-05 ENCOUNTER — Telehealth: Payer: Self-pay | Admitting: Pulmonary Disease

## 2016-10-05 MED ORDER — BUDESONIDE-FORMOTEROL FUMARATE 160-4.5 MCG/ACT IN AERO: 2.0000 | INHALATION_SPRAY | Freq: Two times a day (BID) | RESPIRATORY_TRACT | 4 refills | Status: DC

## 2016-10-05 NOTE — Telephone Encounter (Signed)
Spoke with patient. He was confused about his medication. Correct medication has been sent to pharmacy. Nothing further needed.

## 2016-10-05 NOTE — Telephone Encounter (Signed)
Pt was using the qvar 80 and was taking double puffs of it until he found out by cardio dr that this was doing more harm than good.Cody Ortega

## 2016-10-10 ENCOUNTER — Ambulatory Visit (INDEPENDENT_AMBULATORY_CARE_PROVIDER_SITE_OTHER): Payer: Medicaid Other | Admitting: Cardiovascular Disease

## 2016-10-10 ENCOUNTER — Encounter: Payer: Self-pay | Admitting: Cardiovascular Disease

## 2016-10-10 VITALS — BP 126/88 | HR 58 | Ht 75.0 in | Wt 295.0 lb

## 2016-10-10 DIAGNOSIS — I480 Paroxysmal atrial fibrillation: Secondary | ICD-10-CM

## 2016-10-10 DIAGNOSIS — I10 Essential (primary) hypertension: Secondary | ICD-10-CM

## 2016-10-10 DIAGNOSIS — I255 Ischemic cardiomyopathy: Secondary | ICD-10-CM | POA: Diagnosis not present

## 2016-10-10 DIAGNOSIS — I5022 Chronic systolic (congestive) heart failure: Secondary | ICD-10-CM | POA: Diagnosis not present

## 2016-10-10 MED ORDER — HYDRALAZINE HCL 25 MG PO TABS: 25.0000 mg | ORAL_TABLET | Freq: Three times a day (TID) | ORAL | 11 refills | Status: DC

## 2016-10-10 NOTE — Patient Instructions (Addendum)
Medication Instructions:  Your physician has recommended you make the following change in your medication:  1. INCREASE Hydralazine to 25mg  take one tablet by mouth three times per day  Labwork: Your physician recommends that you return for a FASTING LIPID, CMP and TSH in 6 MONTHS--nothing to eat or drink after midnight, lab opens at 7:30 AM  Testing/Procedures: No new orders.   Follow-Up: Your physician wants you to follow-up in: 6 MONTHS with Dr Burt Knack. You will receive a reminder letter in the mail two months in advance. If you don't receive a letter, please call our office to schedule the follow-up appointment.   Any Other Special Instructions Will Be Listed Below (If Applicable).     If you need a refill on your cardiac medications before your next appointment, please call your pharmacy.

## 2016-10-10 NOTE — Progress Notes (Signed)
Cardiology Office Note Date:  10/12/2016   ID:  Cody Ortega, DOB 01/31/1961, MRN [id number]  PCP:  Cody Nearing, MD  Cardiologist:  Cody Mocha, MD    Chief Complaint  Patient presents with  . Follow-up     History of Present Illness: Cody Ortega is a 56 y.o. male who presents for follow-up evaluation. He is followed for chronic systolic heart failure secondary to underlying nonischemic cardiomyopathy. He also has a history of CAD, chronic kidney disease, malignant hypertension, and paroxysmal atrial fibrillation treated with a rhythm control strategy using amiodarone.  He is here alone today. Overall doing ok. Some limitation from shortness of breath and fatigue, especially when active such as playing volleyball. No orthopnea, PND, edema, or chest pain. He's taking his medications but sometimes forgets a dose. Denies excessive drinking of alcohol.    Past Medical History:  Diagnosis Date  . Anxiety   . Benzodiazepine dependence ([place])   . Bronchial asthma   . CAD (coronary artery disease)    a. Cath 03/2010: mod RCA stenosis, severe diag stenosis, treated medically given lack of angina.  . 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 Chronic systolic congestive heart failure (HCC)    Acute decompensation, LVEF less than 20%  . CKD (chronic kidney disease), stage IV (HCC)    Stage 3-4  . Depression   . History of cocaine abuse   . Hypertension   . Microcytic anemia   . PAF (paroxysmal atrial fibrillation) ([place])    a. Episode in 2008 with spontaneous conversion  . S/P implantation of automatic cardioverter/defibrillator (AICD)    a. Medtronic, implanted 2012.  . Stroke (cerebrum) ([place])   . Stroke [facility]) 2004  . Thyroid disease     Past Surgical History:  Procedure Laterality Date  . CARDIOVERSION N/A 01/07/2014   Procedure: CARDIOVERSION;  Surgeon: Lelon Perla, MD;  Location: [facility] ENDOSCOPY;  Service: Cardiovascular;  Laterality: N/A;  .  CARDIOVERSION N/A 02/11/2014   Procedure: CARDIOVERSION;  Surgeon: Pixie Casino, MD;  Location: [facility] ENDOSCOPY;  Service: Cardiovascular;  Laterality: N/A;  . PACEMAKER INSERTION    . TEE WITHOUT CARDIOVERSION N/A 01/07/2014   Procedure: TRANSESOPHAGEAL ECHOCARDIOGRAM (TEE);  Surgeon: Lelon Perla, MD;  Location: [facility] ENDOSCOPY;  Service: Cardiovascular;  Laterality: N/A;  . TEE WITHOUT CARDIOVERSION N/A 02/11/2014   Procedure: TRANSESOPHAGEAL ECHOCARDIOGRAM (TEE);  Surgeon: Pixie Casino, MD;  Location: [facility] ENDOSCOPY;  Service: Cardiovascular;  Laterality: N/A;  . TONSILLECTOMY    . TRANSTHORACIC ECHOCARDIOGRAM  10/2006, 12/2009    Current Outpatient Prescriptions  Medication Sig Dispense Refill  . albuterol (VENTOLIN HFA) 108 (90 Base) MCG/ACT inhaler Inhale 2 puffs into the lungs every 6 (six) hours as needed for wheezing or shortness of breath. 8 g 11  . alprazolam (XANAX) 2 MG tablet Take 1-2 mg by mouth 4 (four) times daily as needed for anxiety.     Marland Kitchen amiodarone (PACERONE) 200 MG tablet Take 1 tablet (200 mg total) by mouth daily. 30 tablet 5  . amLODipine (NORVASC) 10 MG tablet TAKE 1 TABLET BY MOUTH EVERY DAY 30 tablet 11  . aspirin 81 MG EC tablet Take 81 mg by mouth daily.      . budesonide-formoterol (SYMBICORT) 160-4.5 MCG/ACT inhaler Inhale 2 puffs into the lungs 2 (two) times daily. 1 Inhaler 4  . carvedilol (COREG) 6.25 MG tablet TAKE 1 TABLET BY MOUTH TWICE DAILY WITH A MEAL 180 tablet 2  . DULoxetine (CYMBALTA)  60 MG capsule Take 60 mg by mouth daily.    . ferrous sulfate 325 (65 FE) MG tablet Take 325 mg by mouth 2 (two) times daily with a meal. Reported on 06/26/2015    . hydrALAZINE (APRESOLINE) 25 MG tablet Take 1 tablet (25 mg total) by mouth 3 (three) times daily. 90 tablet 11  . lamoTRIgine (LAMICTAL) 25 MG tablet Take 25-100 mg by mouth at bedtime. Take 25 mg by mouth for 2 weeks the increase to 50 mg by mouth at bedtime for 2 weeks then increase to 4 tablets by  mouth at bedtime  0  . mometasone (NASONEX) 50 MCG/ACT nasal spray Place 2 sprays into both nostrils daily.    Marland Kitchen oxcarbazepine (TRILEPTAL) 600 MG tablet Take 2 tablets by mouth at night  1  . potassium chloride SA (K-DUR,KLOR-CON) 20 MEQ tablet TAKE 2 TABLETS BY MOUTH DAILY 180 tablet 3  . pravastatin (PRAVACHOL) 40 MG tablet TAKE 1 TABLET BY MOUTH EVERY NIGHT AT BEDTIME 90 tablet 3  . RESTASIS MULTIDOSE 0.05 % ophthalmic emulsion Place 1 drop into both eyes 2 (two) times daily.  3  . sildenafil (REVATIO) 20 MG tablet TAKE 2 TO 5 TABLETS BY MOUTH EVERY DAY AS NEEDED PRIOR TO SEXUAL ACTIVITY 100 tablet 1  . Spacer/Aero-Holding Chambers (AEROCHAMBER MV) inhaler Use as instructed 1 each 0  . XARELTO 20 MG TABS tablet TAKE 1 TABLET BY MOUTH EVERY DAY WITH DINNER 30 tablet 9   No current facility-administered medications for this visit.     Allergies:   Codeine and Hydrocodone-acetaminophen   Social History:  The patient  reports that he has quit smoking. He has never used smokeless tobacco. He reports that he drinks alcohol. He reports that he does not use drugs.   Family History:  The patient's  family history includes Breast cancer in his mother; Cerebral palsy in his daughter; Diabetes in his father; Multiple myeloma in his mother; Peripheral vascular disease in his father; Prostate cancer in his father.    ROS:  Please see the history of present illness.  Otherwise, review of systems is positive for cough, depression, back pain, muscle pain, easy bruising, excessive fatigue, snoring, wheezing, constipation, anxiety, joint swelling.  All other systems are reviewed and negative.    PHYSICAL EXAM: VS:  BP 126/88   Pulse (!) 58   Ht 6\' 3"  (1.905 m)   Wt 295 lb (133.8 kg)   BMI 36.87 kg/m  , BMI Body mass index is 36.87 kg/m. GEN: Well nourished, well developed, in no acute distress  HEENT: normal  Neck: no JVD, no masses. No carotid bruits Cardiac: RRR without murmur or gallop                 Respiratory:  clear to auscultation bilaterally, normal work of breathing GI: soft, nontender, nondistended, + BS MS: no deformity or atrophy  Ext: no pretibial edema, pedal pulses 2+= bilaterally Skin: warm and dry, no rash Neuro:  Strength and sensation are intact Psych: euthymic mood, full affect  EKG:  EKG is ordered today. The ekg ordered today shows sinus bradycardia 58 bpm, low voltage QRS, otherwise within normal  Recent Labs: 01/26/2016: Brain Natriuretic Peptide 55.0; Hemoglobin 16.6; Platelets 197 10/03/2016: ALT 65; BUN 24; Creatinine, Ser 1.84; Potassium 3.6; Sodium 141; TSH 1.500   Lipid Panel     Component Value Date/Time   CHOL 156 10/03/2016 1100   TRIG 112 10/03/2016 1100   HDL 43 10/03/2016 1100  CHOLHDL 3.6 10/03/2016 1100   CHOLHDL 3.9 10/12/2015 1026   VLDL 27 10/12/2015 1026   LDLCALC 91 10/03/2016 1100      Wt Readings from Last 3 Encounters:  10/10/16 295 lb (133.8 kg)  08/12/16 299 lb 12.8 oz (136 kg)  07/01/16 (!) 306 lb (138.8 kg)     Cardiac Studies Reviewed: Echo 10-28-2015: Study Conclusions  - Left ventricle: The cavity size was normal. Wall thickness was increased in a pattern of moderate LVH. The estimated ejection fraction was 35%. Diffuse hypokinesis. Doppler parameters are consistent with abnormal left ventricular relaxation (grade 1 diastolic dysfunction). - Aortic valve: There was no stenosis. - Aorta: Mildly dilated aortic root. Aortic root dimension: 38 mm (ED). - Mitral valve: Mildly calcified annulus. Mildly calcified leaflets . There was no significant regurgitation. - Left atrium: The atrium was moderately dilated. - Right ventricle: Poorly visualized. The cavity size was normal. Pacer wire or catheter noted in right ventricle. Systolic function was normal. - Right atrium: The atrium was mildly dilated. - Tricuspid valve: Peak RV-RA gradient (S): 23 mm Hg. - Pulmonary arteries: PA peak pressure: 38  mm Hg (S). - Systemic veins: IVC measured 2.2 cm with < 50% respirophasic variation, suggesting RA pressure 15 mmHg.  Impressions:  - Technically difficult study with poor acoustic windows. Echo contrast may have helped but was not used. Normal LV size with moderate LV hypertrophy. Diffuse hypokinesis, EF 35%. Normal RV size and systolic function (poorly visualized).  ASSESSMENT AND PLAN: 1.  Chronic systolic heart failure, NYHA II: overall appears stable on his current Rx. BP control suboptimal - will increase hydralazine. Otherwise continue current Rx.   2. CAD, native vessel: no angina. Same meds for CAD. Reviewed with no changes recommended.   3. Malignant HTN: increase hydralazine to 25 mg TID, continue other antihypertensives.  4. Paroxysmal atrial fibrillation: maintaining sinus rhythm on amiodarone. Continue to monitor labs every 6 months. On Xarelto for anticoagulation without bleeding problems.   5. Elevated LFT's: improved from prior - reviewed today.  Current medicines are reviewed with the patient today.  The patient does not have concerns regarding medicines.  Labs/ tests ordered today include:   Orders Placed This Encounter  Procedures  . Lipid panel  . Comp Met (CMET)  . TSH  . EKG 12-Lead    Disposition:   FU 6 months  Signed, Cody Mocha, MD  10/12/2016 11:00 PM    [facility] [address], [place], [place]  [address] Phone: [phone]; Fax: [phone]

## 2016-11-13 ENCOUNTER — Other Ambulatory Visit: Payer: Self-pay | Admitting: Cardiovascular Disease

## 2016-11-18 ENCOUNTER — Ambulatory Visit: Payer: Medicaid Other | Admitting: Pulmonary Disease

## 2016-12-16 ENCOUNTER — Ambulatory Visit (INDEPENDENT_AMBULATORY_CARE_PROVIDER_SITE_OTHER): Payer: Medicaid Other | Admitting: Adult Health

## 2016-12-16 ENCOUNTER — Encounter: Payer: Self-pay | Admitting: Adult Health

## 2016-12-16 DIAGNOSIS — J302 Other seasonal allergic rhinitis: Secondary | ICD-10-CM

## 2016-12-16 DIAGNOSIS — J449 Chronic obstructive pulmonary disease, unspecified: Secondary | ICD-10-CM

## 2016-12-16 NOTE — Progress Notes (Signed)
@Patient  ID: Cody Ortega, male    DOB: Jul 09, 1960, 56 y.o.   MRN: [id number]  Chief Complaint  Patient presents with  . Follow-up    COPD    Referring provider: [name], MD  HPI: 56 yo male former smoker followed for COPD And asthma, chronic rhinitis Has CM f/by cards on Amiodarone s/p ICD    TEST  PFT 05/13/16: FVC 4.31 L (73%) FEV1 2.95 L (65%) FEV1/FVC 0.68 FEF 25-75 2.04 L (54%) negative bronchodilator response TLC 7.11 L (89%) RV 112% ERV 30% DLCO corrected 70% (Hgb 15.6) 07/30/14: FVC 4.19 L (83%) FEV1 3.36 L (74%) FEV1/FVC 0.68 FEF 25-75 2.34 L (60%)                                                      TLC 7.96 L (99%) RV 122% ERV 8% DLCO uncorrected 80%  6MWT 05/13/16:  Walked 468 meters / Baseline Sat 96% on RA / Nadir Sat 96% on RA  IMAGING ESOPHAGRAM/BARIUM SWALLOW 03/29/16 (per radiologist):Normal esophagogram without abnormal motility or hiatal hernia.  CXR PA/LAT 10/12/2015   No focal opacity or mass appreciated. No pleural effusion. Heart normal in size & mediastinum normal in contour.   LABS 03/31/16 Alpha-1 antitrypsin: MM (170)  12/25/13 UDS:  Positive for Benzodiazepines & THC  12/16/2016 Follow up : COPD  Patient returns for a four-month follow-up. Patient has known COPD and asthma. Remains on Symbicort twice daily. Says overall his breathing is doing about the same. Does get short of breath with walking at times. Has intermittent cough. Gets thick mucus at times. Also has some postnasal drainage. Denies any fever, chest pain, orthopnea, PND, hemoptysis, or orthopnea. Working on weight loss.  , on home scale down 30lbs over last 6 months .  Has been swimming , trying to eat better.    Allergies  Allergen Reactions  . Codeine Itching and Nausea And Vomiting  . Hydrocodone-Acetaminophen Itching and Nausea And Vomiting    There is no immunization history for the selected administration types on file for this patient.  Past Medical  History:  Diagnosis Date  . Anxiety   . Benzodiazepine dependence ([place])   . Bronchial asthma   . CAD (coronary artery disease)    a. Cath 03/2010: mod RCA stenosis, severe diag stenosis, treated medically given lack of angina.  . 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 Chronic systolic congestive heart failure (HCC)    Acute decompensation, LVEF less than 20%  . CKD (chronic kidney disease), stage IV (HCC)    Stage 3-4  . Depression   . History of cocaine abuse   . Hypertension   . Microcytic anemia   . PAF (paroxysmal atrial fibrillation) ([place])    a. Episode in 2008 with spontaneous conversion  . S/P implantation of automatic cardioverter/defibrillator (AICD)    a. Medtronic, implanted 2012.  . Stroke (cerebrum) ([place])   . Stroke [facility]) 2004  . Thyroid disease     Tobacco History: History  Smoking Status  . Former Smoker  . Packs/day: 0.50  . Years: 0.50  . Types: Cigarettes  Smokeless Tobacco  . Never Used    Comment: Significant Second-hand and only 3 months himself   Counseling given: Not Answered   Outpatient Encounter Prescriptions as of 12/16/2016  Medication Sig  .  albuterol (VENTOLIN HFA) 108 (90 Base) MCG/ACT inhaler Inhale 2 puffs into the lungs every 6 (six) hours as needed for wheezing or shortness of breath.  . alprazolam (XANAX) 2 MG tablet Take 1-2 mg by mouth 4 (four) times daily as needed for anxiety.   Marland Kitchen amiodarone (PACERONE) 200 MG tablet Take 1 tablet (200 mg total) by mouth daily.  Marland Kitchen amLODipine (NORVASC) 10 MG tablet TAKE 1 TABLET BY MOUTH EVERY DAY  . aspirin 81 MG EC tablet Take 81 mg by mouth daily.    . budesonide-formoterol (SYMBICORT) 160-4.5 MCG/ACT inhaler Inhale 2 puffs into the lungs 2 (two) times daily.  . carvedilol (COREG) 6.25 MG tablet TAKE 1 TABLET BY MOUTH TWICE DAILY WITH A MEAL  . DULoxetine (CYMBALTA) 60 MG capsule Take 60 mg by mouth daily.  . hydrALAZINE (APRESOLINE) 25 MG tablet Take 1 tablet (25 mg total) by mouth 3  (three) times daily.  Marland Kitchen lamoTRIgine (LAMICTAL) 25 MG tablet Take 25-100 mg by mouth at bedtime. Take 25 mg by mouth for 2 weeks the increase to 50 mg by mouth at bedtime for 2 weeks then increase to 4 tablets by mouth at bedtime  . oxcarbazepine (TRILEPTAL) 600 MG tablet Take 2 tablets by mouth at night  . potassium chloride SA (K-DUR,KLOR-CON) 20 MEQ tablet TAKE 2 TABLETS BY MOUTH DAILY  . pravastatin (PRAVACHOL) 40 MG tablet TAKE 1 TABLET BY MOUTH EVERY NIGHT AT BEDTIME  . RESTASIS MULTIDOSE 0.05 % ophthalmic emulsion Place 1 drop into both eyes 2 (two) times daily.  . sildenafil (REVATIO) 20 MG tablet TAKE 2 TO 5 TABLETS BY MOUTH EVERY DAY AS NEEDED PRIOR TO SEXUAL ACTIVITY  . Spacer/Aero-Holding Chambers (AEROCHAMBER MV) inhaler Use as instructed  . XARELTO 20 MG TABS tablet TAKE 1 TABLET BY MOUTH EVERY DAY WITH DINNER  . ferrous sulfate 325 (65 FE) MG tablet Take 325 mg by mouth 2 (two) times daily with a meal. Reported on 06/26/2015   No facility-administered encounter medications on file as of 12/16/2016.      Review of Systems  Constitutional:   No  weight loss, night sweats,  Fevers, chills, fatigue, or  lassitude.  HEENT:   No headaches,  Difficulty swallowing,  Tooth/dental problems, or  Sore throat,                No sneezing, itching, ear ache,  +nasal congestion, post nasal drip,   CV:  No chest pain,  Orthopnea, PND, swelling in lower extremities, anasarca, dizziness, palpitations, syncope.   GI  No heartburn, indigestion, abdominal pain, nausea, vomiting, diarrhea, change in bowel habits, loss of appetite, bloody stools.   Resp:    No chest wall deformity  Skin: no rash or lesions.  GU: no dysuria, change in color of urine, no urgency or frequency.  No flank pain, no hematuria   MS:  No joint pain or swelling.  No decreased range of motion.  No back pain.    Physical Exam  BP 132/76 (BP Location: Left Arm, Cuff Size: Normal)   Pulse (!) 58   Ht 6\' 3"  (1.905 m)    Wt 290 lb 3.2 oz (131.6 kg)   SpO2 99%   BMI 36.27 kg/m   GEN: A/Ox3; pleasant , NAD, obese    HEENT:  [place]/AT,  EACs-clear, TMs-wnl, NOSE-clear, THROAT-clear, no lesions, no postnasal drip or exudate noted.   NECK:  Supple w/ fair ROM; no JVD; normal carotid impulses w/o bruits; no thyromegaly or nodules  palpated; no lymphadenopathy.    RESP  Clear  P & A; w/o, wheezes/ rales/ or rhonchi. no accessory muscle use, no dullness to percussion  CARD:  RRR, no m/r/g, no peripheral edema, pulses intact, no cyanosis or clubbing.  GI:   Soft & nt; nml bowel sounds; no organomegaly or masses detected.   Musco: Warm bil, no deformities or joint swelling noted.   Neuro: alert, no focal deficits noted.    Skin: Warm, no lesions or rashes    Lab Results:  CBC  BMET  Imaging: No results found.   Assessment & Plan:   COPD with asthma ([place]) Compensated without flare  Control for triggers   PVX today   Plan  Patient Instructions  May try Zyrtec 10mg  At bedtime  As needed  Drainage.  May Mucinex DM Twice daily  As needed  Cough/congestion  Continue on Symbicort 2 puffs Twice daily  , rinse after use.  Pneumovax vaccine today .  Follow up in 4 months with  Dr. Ashok Cordia and As needed       Chronic seasonal allergic rhinitis Add zyrtec As needed       Rexene Edison, NP 12/16/2016

## 2016-12-16 NOTE — Assessment & Plan Note (Signed)
Compensated without flare  Control for triggers   PVX today   Plan  Patient Instructions  May try Zyrtec 10mg  At bedtime  As needed  Drainage.  May Mucinex DM Twice daily  As needed  Cough/congestion  Continue on Symbicort 2 puffs Twice daily  , rinse after use.  Pneumovax vaccine today .  Follow up in 4 months with  Dr. Ashok Cordia and As needed

## 2016-12-16 NOTE — Patient Instructions (Addendum)
May try Zyrtec 10mg  At bedtime  As needed  Drainage.  May Mucinex DM Twice daily  As needed  Cough/congestion  Continue on Symbicort 2 puffs Twice daily  , rinse after use.  Pneumovax vaccine today .  Follow up in 4 months with  Dr. Ashok Cordia and As needed

## 2016-12-16 NOTE — Assessment & Plan Note (Signed)
Add zyrtec As needed

## 2016-12-19 NOTE — Progress Notes (Signed)
Note reviewed.  Sonia Baller Ashok Cordia, M.D. [facility] Pager:  [phone] After 3pm or if no response, call [phone] 7:04 AM 12/19/16

## 2016-12-25 ENCOUNTER — Other Ambulatory Visit: Payer: Self-pay | Admitting: Cardiovascular Disease

## 2016-12-26 NOTE — Telephone Encounter (Signed)
Pt's pharmacy requesting a refill on sildenafil 20 mg tablet. Would you like to refill this medication? Please advise

## 2016-12-27 NOTE — Telephone Encounter (Signed)
This is fine, as long as patient is not on nitrates. thanks

## 2016-12-28 ENCOUNTER — Other Ambulatory Visit: Payer: Self-pay | Admitting: Cardiovascular Disease

## 2016-12-28 DIAGNOSIS — R05 Cough: Secondary | ICD-10-CM

## 2016-12-28 DIAGNOSIS — I5022 Chronic systolic (congestive) heart failure: Secondary | ICD-10-CM

## 2016-12-28 DIAGNOSIS — R059 Cough, unspecified: Secondary | ICD-10-CM

## 2016-12-28 DIAGNOSIS — R0602 Shortness of breath: Secondary | ICD-10-CM

## 2017-01-05 ENCOUNTER — Telehealth: Payer: Self-pay | Admitting: *Deleted

## 2017-01-05 NOTE — Telephone Encounter (Signed)
I called medicaid for a prior authorization for AMIODARONE, they said no PA needed. I called [facility] ands informed them how to do the overide and they had already done it and completed order.

## 2017-01-10 ENCOUNTER — Ambulatory Visit (INDEPENDENT_AMBULATORY_CARE_PROVIDER_SITE_OTHER): Payer: Medicaid Other | Admitting: *Deleted

## 2017-01-10 DIAGNOSIS — I5022 Chronic systolic (congestive) heart failure: Secondary | ICD-10-CM | POA: Diagnosis not present

## 2017-01-10 DIAGNOSIS — I255 Ischemic cardiomyopathy: Secondary | ICD-10-CM

## 2017-01-11 LAB — CUP PACEART INCLINIC DEVICE CHECK
Battery Voltage: 2.97 V
Brady Statistic RV Percent Paced: 0.04 %
Date Time Interrogation Session: 20180911155648
HIGH POWER IMPEDANCE MEASURED VALUE: 323 Ohm
HighPow Impedance: 45 Ohm
HighPow Impedance: 55 Ohm
Implantable Lead Location: 753860
Implantable Lead Model: 185
Lead Channel Impedance Value: 323 Ohm
Lead Channel Pacing Threshold Amplitude: 1.125 V
Lead Channel Pacing Threshold Pulse Width: 0.4 ms
Lead Channel Setting Pacing Amplitude: 2.5 V
Lead Channel Setting Sensing Sensitivity: 0.3 mV
MDC IDC LEAD IMPLANT DT: 20120806
MDC IDC LEAD SERIAL: 345870
MDC IDC MSMT LEADCHNL RV SENSING INTR AMPL: 7.5 mV
MDC IDC MSMT LEADCHNL RV SENSING INTR AMPL: 7.625 mV
MDC IDC PG IMPLANT DT: 20120806
MDC IDC SET LEADCHNL RV PACING PULSEWIDTH: 0.4 ms

## 2017-01-11 NOTE — Progress Notes (Signed)
ICD check in clinic. Normal device function. Threshold and sensing consistent with previous device measurements. Impedance trends stable over time. No evidence of any ventricular arrhythmias. Histogram distribution appropriate for patient and level of activity. Stable thoracic impedance. No changes made this session. Device programmed at appropriate safety margins. Device programmed to optimize intrinsic conduction. Battery voltage 2.97V (ERI 2.63V). Pt will follow up with DC in 3 months. Patient education completed including shock plan. Alert tones demonstrated for patient.

## 2017-02-04 ENCOUNTER — Other Ambulatory Visit: Payer: Self-pay | Admitting: Cardiovascular Disease

## 2017-02-15 ENCOUNTER — Other Ambulatory Visit: Payer: Self-pay | Admitting: Internal Medicine

## 2017-02-25 ENCOUNTER — Other Ambulatory Visit: Payer: Self-pay | Admitting: Cardiovascular Disease

## 2017-02-27 NOTE — Telephone Encounter (Signed)
Pt's pharmacy requesting a refill on sildenafil 20 mg tablet. Would you like to refill this medication? Please advise

## 2017-03-15 ENCOUNTER — Telehealth: Payer: Self-pay | Admitting: Cardiovascular Disease

## 2017-03-15 NOTE — Telephone Encounter (Signed)
New Message  Pt would like to get orders for lab work put in the system to complete before appt with Dr. Burt Knack on 1/17. Please call back to discuss

## 2017-03-15 NOTE — Telephone Encounter (Signed)
Scheduled patient for fasting lab work 1/15 (2 days prior to OV with Dr. Burt Knack). He was grateful for call and agrees with treatment plan.

## 2017-03-17 ENCOUNTER — Ambulatory Visit: Payer: Medicaid Other | Attending: Internal Medicine | Admitting: Internal Medicine

## 2017-03-17 ENCOUNTER — Other Ambulatory Visit: Payer: Self-pay

## 2017-03-17 ENCOUNTER — Encounter: Payer: Self-pay | Admitting: Internal Medicine

## 2017-03-17 VITALS — BP 150/87 | HR 62 | Temp 98.2°F | Resp 18 | Wt 279.8 lb

## 2017-03-17 DIAGNOSIS — F419 Anxiety disorder, unspecified: Secondary | ICD-10-CM | POA: Insufficient documentation

## 2017-03-17 DIAGNOSIS — J449 Chronic obstructive pulmonary disease, unspecified: Secondary | ICD-10-CM | POA: Diagnosis not present

## 2017-03-17 DIAGNOSIS — N529 Male erectile dysfunction, unspecified: Secondary | ICD-10-CM | POA: Diagnosis not present

## 2017-03-17 DIAGNOSIS — H04129 Dry eye syndrome of unspecified lacrimal gland: Secondary | ICD-10-CM | POA: Insufficient documentation

## 2017-03-17 DIAGNOSIS — I130 Hypertensive heart and chronic kidney disease with heart failure and stage 1 through stage 4 chronic kidney disease, or unspecified chronic kidney disease: Secondary | ICD-10-CM | POA: Insufficient documentation

## 2017-03-17 DIAGNOSIS — K649 Unspecified hemorrhoids: Secondary | ICD-10-CM | POA: Diagnosis not present

## 2017-03-17 DIAGNOSIS — Z7982 Long term (current) use of aspirin: Secondary | ICD-10-CM | POA: Diagnosis not present

## 2017-03-17 DIAGNOSIS — E7800 Pure hypercholesterolemia, unspecified: Secondary | ICD-10-CM | POA: Insufficient documentation

## 2017-03-17 DIAGNOSIS — N183 Chronic kidney disease, stage 3 (moderate): Secondary | ICD-10-CM | POA: Insufficient documentation

## 2017-03-17 DIAGNOSIS — G4733 Obstructive sleep apnea (adult) (pediatric): Secondary | ICD-10-CM | POA: Insufficient documentation

## 2017-03-17 DIAGNOSIS — M170 Bilateral primary osteoarthritis of knee: Secondary | ICD-10-CM | POA: Diagnosis not present

## 2017-03-17 DIAGNOSIS — I480 Paroxysmal atrial fibrillation: Secondary | ICD-10-CM | POA: Insufficient documentation

## 2017-03-17 DIAGNOSIS — I5022 Chronic systolic (congestive) heart failure: Secondary | ICD-10-CM | POA: Insufficient documentation

## 2017-03-17 DIAGNOSIS — G894 Chronic pain syndrome: Secondary | ICD-10-CM | POA: Insufficient documentation

## 2017-03-17 DIAGNOSIS — Z79899 Other long term (current) drug therapy: Secondary | ICD-10-CM | POA: Diagnosis not present

## 2017-03-17 DIAGNOSIS — Z2821 Immunization not carried out because of patient refusal: Secondary | ICD-10-CM

## 2017-03-17 DIAGNOSIS — F329 Major depressive disorder, single episode, unspecified: Secondary | ICD-10-CM | POA: Diagnosis not present

## 2017-03-17 DIAGNOSIS — Z2882 Immunization not carried out because of caregiver refusal: Secondary | ICD-10-CM | POA: Diagnosis not present

## 2017-03-17 DIAGNOSIS — I10 Essential (primary) hypertension: Secondary | ICD-10-CM

## 2017-03-17 DIAGNOSIS — K625 Hemorrhage of anus and rectum: Secondary | ICD-10-CM

## 2017-03-17 DIAGNOSIS — Z87891 Personal history of nicotine dependence: Secondary | ICD-10-CM | POA: Insufficient documentation

## 2017-03-17 DIAGNOSIS — K219 Gastro-esophageal reflux disease without esophagitis: Secondary | ICD-10-CM | POA: Diagnosis not present

## 2017-03-17 DIAGNOSIS — Z7901 Long term (current) use of anticoagulants: Secondary | ICD-10-CM | POA: Insufficient documentation

## 2017-03-17 DIAGNOSIS — I2510 Atherosclerotic heart disease of native coronary artery without angina pectoris: Secondary | ICD-10-CM | POA: Insufficient documentation

## 2017-03-17 DIAGNOSIS — Z950 Presence of cardiac pacemaker: Secondary | ICD-10-CM | POA: Insufficient documentation

## 2017-03-17 MED ORDER — HYDROCORTISONE ACETATE 25 MG RE SUPP: 25.0000 mg | Freq: Two times a day (BID) | RECTAL | 2 refills | Status: DC | PRN

## 2017-03-17 NOTE — Patient Instructions (Signed)
Nurse only BP check in 2 weeks.  Continue to monitor your  blood pressure at least once a week.  Use the Anusol suppositories as we discussed.  Try to keep bowel movements off and regular.  If needed you can purchase MiraLAX or Colace over-the-counter to use a stool softener.  You have been referred to a gastroenterologist. Please have the blood count done at your office visit with cardiology.

## 2017-03-17 NOTE — Progress Notes (Signed)
Establish Care;

## 2017-03-17 NOTE — Progress Notes (Signed)
Patient ID: Cody Ortega, male    DOB: 1961/03/24  MRN: [id number]  CC: No chief complaint on file.   Subjective: Cody Ortega is a 56 y.o. male who presents for chronic disease management.  Last saw Dr. Adrian Blackwater 07/2015. His concerns today include:  The patient with history of COPD and asthma, HTN, CKD stage III, CAD, and NICM with chronic systolic CHF and ICD, PAF, elevator LFT,   1. Psychiatrist, Dr. Noemi Chapel, [facility] - followed for depression/anxiety -on Cymbalta, Lamictal, Trileptal, Xanax  2.  Rectal  blooding when he wipes x 1 mth -hx of hemorrhoids. He can feel them prolapsing out at times.  -stools semisoft Referred for colonoscopy in past but did not do it because he is on Xarelto -diarrhea episode 3 days ago -gets retching episodes where he brings up foaming white stuff occasionally -loss 16 lbs since June 2018.  Patient states that this is intentional weight loss through dietary changes. He cut out sodas, sugars and fried foods. Portion control.  Does not exercise as much as he wants to.  Due to arthritis in his knees.  3. Blurred vision: sees an eye doctor. On Restasis for dry eye  4. Saw Dr. Raliegh Ip for OA knees RT>LT in the past.  Requesting referral back to them because his knees have been bothering him more.  He also needs a new brace for the right knee.  Chronic swelling in the right knee -wears knee braces. -steroid inj in past which helped. Belongs to a volley ball group, goes once a wk but does not play.  He mainly stands on the sideline for about 15 minutes at a time before he has to sit down.   5. HTN: BP elevated today.  Took meds this afternoon. Limits salt Goes to [employer] 3 x a wk to check his blood pressure.  Gives range of 130-140/88s   Patient Active Problem List   Diagnosis Date Noted  . COPD with asthma ([place]) 03/17/2016  . Chronic seasonal allergic rhinitis 03/17/2016  . GERD (gastroesophageal reflux  disease) 03/17/2016  . Chronic kidney disease (CKD), stage IV (severe) ([place]) 03/17/2016  . Chronic pain syndrome 03/16/2015  . Obstructive sleep apnea 03/16/2015  . Chronic low back pain with right-sided sciatica 03/16/2015  . Sinus brady-tachy syndrome ([place]) 02/20/2014  . Atrial fibrillation ([place]) 12/31/2013  . Automatic implantable cardioverter-defibrillator in situ 03/08/2011  . Erectile dysfunction 02/28/2011  . CAD, NATIVE VESSEL 04/26/2010  . PURE HYPERCHOLESTEROLEMIA 04/12/2010  . HLD (hyperlipidemia) 01/11/2010  . Essential hypertension, malignant 12/18/2009  . Chronic systolic heart failure ([place]) 12/18/2009     Current Outpatient Medications on File Prior to Visit  Medication Sig Dispense Refill  . albuterol (VENTOLIN HFA) 108 (90 Base) MCG/ACT inhaler Inhale 2 puffs into the lungs every 6 (six) hours as needed for wheezing or shortness of breath. 8 g 11  . alprazolam (XANAX) 2 MG tablet Take 1-2 mg by mouth 4 (four) times daily as needed for anxiety.     Marland Kitchen amiodarone (PACERONE) 200 MG tablet TAKE 1 TABLET(200 MG) BY MOUTH DAILY 30 tablet 9  . amLODipine (NORVASC) 10 MG tablet TAKE 1 TABLET BY MOUTH EVERY DAY 30 tablet 10  . aspirin 81 MG EC tablet Take 81 mg by mouth daily.      . budesonide-formoterol (SYMBICORT) 160-4.5 MCG/ACT inhaler Inhale 2 puffs into the lungs 2 (two) times daily. 1 Inhaler 4  . carvedilol (COREG) 6.25 MG tablet TAKE  1 TABLET BY MOUTH TWICE DAILY WITH A MEAL 180 tablet 1  . DULoxetine (CYMBALTA) 60 MG capsule Take 60 mg by mouth daily.    . ferrous sulfate 325 (65 FE) MG tablet Take 325 mg by mouth 2 (two) times daily with a meal. Reported on 06/26/2015    . hydrALAZINE (APRESOLINE) 25 MG tablet Take 1 tablet (25 mg total) by mouth 3 (three) times daily. 90 tablet 11  . lamoTRIgine (LAMICTAL) 25 MG tablet Take 25-100 mg by mouth at bedtime. Take 25 mg by mouth for 2 weeks the increase to 50 mg by mouth at bedtime for 2 weeks then increase to 4 tablets by  mouth at bedtime  0  . oxcarbazepine (TRILEPTAL) 600 MG tablet Take 2 tablets by mouth at night  1  . potassium chloride SA (K-DUR,KLOR-CON) 20 MEQ tablet TAKE 2 TABLETS BY MOUTH DAILY 180 tablet 3  . pravastatin (PRAVACHOL) 40 MG tablet TAKE 1 TABLET BY MOUTH EVERY NIGHT AT BEDTIME 90 tablet 3  . sildenafil (REVATIO) 20 MG tablet TAKE 2 TO 5 TABLETS BY MOUTH DAILY AS NEEDED PRIOR TO SEXUAL ACTIVITY  100 tablet 0  . Spacer/Aero-Holding Chambers (AEROCHAMBER MV) inhaler Use as instructed 1 each 0  . XARELTO 20 MG TABS tablet TAKE 1 TABLET BY MOUTH EVERY DAY WITH DINNER 30 tablet 8  . RESTASIS MULTIDOSE 0.05 % ophthalmic emulsion Place 1 drop into both eyes 2 (two) times daily.  3   No current facility-administered medications on file prior to visit.     Allergies  Allergen Reactions  . Codeine Itching and Nausea And Vomiting  . Hydrocodone-Acetaminophen Itching and Nausea And Vomiting    Social History   Socioeconomic History  . Marital status: Divorced    Spouse name: Not on file  . Number of children: Not on file  . Years of education: Not on file  . Highest education level: Not on file  Social Needs  . Financial resource strain: Not on file  . Food insecurity - worry: Not on file  . Food insecurity - inability: Not on file  . Transportation needs - medical: Not on file  . Transportation needs - non-medical: Not on file  Occupational History  . Occupation: Merchandiser, retail: UNEMPLOYED    Comment: Writer in the past  Tobacco Use  . Smoking status: Former Smoker    Packs/day: 0.50    Years: 0.50    Pack years: 0.25    Types: Cigarettes  . Smokeless tobacco: Never Used  . Tobacco comment: Significant Second-hand and only 3 months himself  Substance and Sexual Activity  . Alcohol use: Yes    Alcohol/week: 0.0 oz    Comment: occ  . Drug use: No    Comment: Reported history of cocaine abuse off and on   . Sexual activity: Not on file  Other Topics  Concern  . Not on file  Social History Narrative   Living with a son who is a 59 year old.  He is not     working, unemployed for 3 years.  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  Denied use of alcohol.  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       [facility]:   Originally from [place], [place]. He grew up in [place]. He moved to [place] in 1985. 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 No pets currently. No bird exposure. He did have mold in a previous home. No hot tub exposure.          Family History  Problem Relation Age of Onset  . Breast cancer Mother   . Multiple myeloma Mother   . Prostate cancer Father   . Diabetes Father   . Peripheral vascular disease Father   . Cerebral palsy Daughter   . Lung disease Neg Hx     Past Surgical History:  Procedure Laterality Date  . CARDIOVERSION N/A 02/11/2014   Performed by Pixie Casino, MD at [facility] ENDOSCOPY  . CARDIOVERSION N/A 01/07/2014   Performed by Lelon Perla, MD at [place]  . PACEMAKER INSERTION    . TONSILLECTOMY    . TRANSESOPHAGEAL ECHOCARDIOGRAM (TEE) N/A 02/11/2014   Performed by Pixie Casino, MD at [place]  . TRANSESOPHAGEAL ECHOCARDIOGRAM (TEE) N/A 01/07/2014   Performed by Lelon Perla, MD at [place]  . TRANSTHORACIC ECHOCARDIOGRAM  10/2006, 12/2009    ROS: Review of Systems As stated above. PHYSICAL EXAM: BP (!) 150/87 (BP Location: Right Arm, Cuff Size: Large)   Pulse 62   Temp 98.2 F (36.8 C) (Oral)   Resp 18   Wt 279 lb 12.8 oz (126.9 kg)   SpO2 96%   BMI 34.97 kg/m   Wt Readings from Last 3 Encounters:  03/17/17 279 lb 12.8 oz (126.9 kg)  12/16/16 290 lb 3.2 oz (131.6 kg)  10/10/16 295 lb (133.8 kg)    Physical Exam  General appearance - alert, well appearing, middle age caucasian male and in no distress Mental status - alert, oriented to person, place, and time, normal mood,  behavior, speech, dress, motor activity, and thought processes Mouth - mucous membranes moist, pharynx normal without lesions Neck - supple, no significant adenopathy Chest - clear to auscultation, no wheezes, rales or rhonchi, symmetric air entry Heart - normal rate, regular rhythm, normal S1, S2, no murmurs, rubs, clicks or gallops Rectal -CMA Johanna present: no external hemorrhoids. ? Internal hemorrhoid noted. Difficult to get pt to strain down Extremities - peripheral pulses normal, no pedal edema, no clubbing or cyanosis MSK: knees: + jt enlargement RT>>LT. + crepitus on passive movement Depression screen [facility] 2/9 03/17/2017 07/09/2015 03/16/2015  Decreased Interest 0 1 0  Down, Depressed, Hopeless 1 0 0  PHQ - 2 Score 1 1 0  Altered sleeping 1 2 -  Tired, decreased energy 1 3 -  Change in appetite 1 1 -  Feeling bad or failure about yourself  1 0 -  Trouble concentrating 1 0 -  Moving slowly or fidgety/restless 0 0 -  Suicidal thoughts 0 0 -  PHQ-9 Score 6 7 -    ASSESSMENT AND PLAN: 1. Essential hypertension -Not at goal today but looking at flow charts his blood pressure has been good and he reports good ambulatory readings We will have him follow-up in several weeks for nurse only blood pressure check Continue hydralazine, amlodipine and carvedilol  2. Rectal bleeding He needs to be seen by GI for colonoscopy - hydrocortisone (ANUSOL-HC) 25 MG suppository; Place 1 suppository (25 mg total) 2 (two) times daily as needed rectally for hemorrhoids or anal itching.  Dispense: 12 suppository; Refill: 2 - CBC; Future  3. Hemorrhoids, unspecified hemorrhoid type Stressed the importance of keeping bowel movements soft and regular.  He declines prescription for stool softener at this time. Anusol suppositories to use as needed - Ambulatory  referral to Gastroenterology  4. Osteoarthritis of both knees, unspecified osteoarthritis type - Ambulatory referral to Orthopedic  Surgery  5. Anxiety and depression Followed by psychiatry 6. Influenza vaccination declined   Patient was given the opportunity to ask questions.  Patient verbalized understanding of the plan and was able to repeat key elements of the plan.   No orders of the defined types were placed in this encounter.    Requested Prescriptions    No prescriptions requested or ordered in this encounter    F/u in 3 mths Karle Plumber, MD, Rosalita Chessman

## 2017-03-20 ENCOUNTER — Other Ambulatory Visit: Payer: Self-pay | Admitting: Cardiovascular Disease

## 2017-04-12 ENCOUNTER — Other Ambulatory Visit: Payer: Medicaid Other | Admitting: *Deleted

## 2017-04-12 ENCOUNTER — Ambulatory Visit (INDEPENDENT_AMBULATORY_CARE_PROVIDER_SITE_OTHER): Payer: Medicaid Other | Admitting: *Deleted

## 2017-04-12 ENCOUNTER — Other Ambulatory Visit: Payer: Self-pay | Admitting: Internal Medicine

## 2017-04-12 DIAGNOSIS — I495 Sick sinus syndrome: Secondary | ICD-10-CM

## 2017-04-12 LAB — CUP PACEART INCLINIC DEVICE CHECK
Date Time Interrogation Session: 20181212121607
HIGH POWER IMPEDANCE MEASURED VALUE: 47 Ohm
HighPow Impedance: 323 Ohm
HighPow Impedance: 58 Ohm
Implantable Lead Implant Date: 20120806
Implantable Lead Location: 753860
Implantable Lead Model: 185
Implantable Lead Serial Number: 345870
Implantable Pulse Generator Implant Date: 20120806
Lead Channel Impedance Value: 323 Ohm
Lead Channel Pacing Threshold Pulse Width: 0.4 ms
Lead Channel Sensing Intrinsic Amplitude: 7.75 mV
Lead Channel Setting Sensing Sensitivity: 0.3 mV
MDC IDC MSMT BATTERY VOLTAGE: 2.97 V
MDC IDC MSMT LEADCHNL RV PACING THRESHOLD AMPLITUDE: 1.25 V
MDC IDC SET LEADCHNL RV PACING AMPLITUDE: 2.5 V
MDC IDC SET LEADCHNL RV PACING PULSEWIDTH: 0.4 ms
MDC IDC STAT BRADY RV PERCENT PACED: 0.03 %

## 2017-04-12 LAB — CBC
HEMOGLOBIN: 16.6 g/dL (ref 13.0–17.7)
Hematocrit: 48.5 % (ref 37.5–51.0)
MCH: 32 pg (ref 26.6–33.0)
MCHC: 34.2 g/dL (ref 31.5–35.7)
MCV: 93 fL (ref 79–97)
Platelets: 228 10*3/uL (ref 150–379)
RBC: 5.19 x10E6/uL (ref 4.14–5.80)
RDW: 14.2 % (ref 12.3–15.4)
WBC: 8.3 10*3/uL (ref 3.4–10.8)

## 2017-04-12 NOTE — Progress Notes (Signed)
ICD check in clinic. Normal device function. Threshold and sensing consistent with previous device measurements. Impedance trend stable over time. No evidence of any ventricular arrhythmias. Histogram distribution appropriate for patient and level of activity. No changes made this session. Device programmed at appropriate safety margins. Device programmed to optimize intrinsic conduction. Battery 2.97 V (RRT=2.63V). Optivol stable. Pt enrolled in remote follow-up. Patient education completed including shock plan. ROV with GT 07/04/17.

## 2017-04-17 ENCOUNTER — Ambulatory Visit: Payer: Medicaid Other | Admitting: Adult Health

## 2017-04-17 ENCOUNTER — Encounter: Payer: Self-pay | Admitting: Adult Health

## 2017-04-17 VITALS — BP 132/74 | HR 80 | Ht 75.0 in | Wt 279.8 lb

## 2017-04-17 DIAGNOSIS — J449 Chronic obstructive pulmonary disease, unspecified: Secondary | ICD-10-CM

## 2017-04-17 DIAGNOSIS — J302 Other seasonal allergic rhinitis: Secondary | ICD-10-CM | POA: Diagnosis not present

## 2017-04-17 DIAGNOSIS — D229 Melanocytic nevi, unspecified: Secondary | ICD-10-CM | POA: Diagnosis not present

## 2017-04-17 MED ORDER — MOMETASONE FUROATE 50 MCG/ACT NA SUSP: 2.0000 | Freq: Every day | NASAL | 5 refills | Status: DC

## 2017-04-17 NOTE — Progress Notes (Signed)
@Patient  ID: Cody Ortega, male    DOB: 16-Jun-1960, 56 y.o.   MRN: [id number]  Chief Complaint  Patient presents with  . Follow-up    COPD     Referring provider: No ref. provider found  HPI: 56 yo male former smoker followed for COPD And asthma, chronic rhinitis Has CM f/by cards on Amiodarone s/p ICD    TEST  PFT 05/13/16: FVC 4.31 L (73%) FEV1 2.95 L (65%) FEV1/FVC 0.68 FEF 25-75 2.04 L (54%) negative bronchodilator response TLC 7.11 L (89%) RV 112% ERV 30% DLCO corrected 70% (Hgb 15.6) 07/30/14: FVC 4.19 L (83%) FEV1 3.36 L (74%) FEV1/FVC 0.68 FEF 25-75 2.34 L (60%) TLC 7.96 L (99%) RV 122% ERV 8% DLCO uncorrected 80%  6MWT 05/13/16: Walked 468 meters / Baseline Sat 96% on RA / Nadir Sat 96% on RA  IMAGING ESOPHAGRAM/BARIUM SWALLOW 03/29/16 (per radiologist):Normal esophagogram without abnormal motility or hiatal hernia.  CXR PA/LAT 10/12/2015  No focal opacity or mass appreciated. No pleural effusion. Heart normal in size &mediastinum normal in contour.   LABS 03/31/16 Alpha-1 antitrypsin: MM (170)  12/25/13 UDS: Positive for Benzodiazepines &THC  04/17/2017 Follow up ; COPD  Patient returns for a 3-month follow-up.  Patient says overall he has been doing okay.  Patient has known COPD and asthma.  He remains on Symbicort twice daily.  Patient says his breathing is been doing okay he does get short of breath with some activities.  He denies increased cough or wheezing.  He denies any chest pain orthopnea PND or increased leg swelling.  Patient continues to work on weight loss with healthy diet.  Patient's weight is down a few more pounds.     Allergies  Allergen Reactions  . Codeine Itching and Nausea And Vomiting  . Hydrocodone-Acetaminophen Itching and Nausea And Vomiting    Immunization History  Administered Date(s) Administered  . Tdap 03/18/2017    Past Medical History:  Diagnosis Date   . Anxiety   . Benzodiazepine dependence ([place])   . Bronchial asthma   . CAD (coronary artery disease)    a. Cath 03/2010: mod RCA stenosis, severe diag stenosis, treated medically given lack of angina.  . 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 Chronic systolic congestive heart failure (HCC)    Acute decompensation, LVEF less than 20%  . CKD (chronic kidney disease), stage IV (HCC)    Stage 3-4  . Depression   . History of cocaine abuse   . Hypertension   . Microcytic anemia   . PAF (paroxysmal atrial fibrillation) ([place])    a. Episode in 2008 with spontaneous conversion  . S/P implantation of automatic cardioverter/defibrillator (AICD)    a. Medtronic, implanted 2012.  . Stroke (cerebrum) ([place])   . Stroke [facility]) 2004  . Thyroid disease     Tobacco History: Social History   Tobacco Use  Smoking Status Former Smoker  . Packs/day: 0.50  . Years: 0.50  . Pack years: 0.25  . Types: Cigarettes  Smokeless Tobacco Never Used  Tobacco Comment   Significant Second-hand and only 3 months himself   Counseling given: Not Answered Comment: Significant Second-hand and only 3 months himself   Outpatient Encounter Medications as of 04/17/2017  Medication Sig  . albuterol (VENTOLIN HFA) 108 (90 Base) MCG/ACT inhaler Inhale 2 puffs into the lungs every 6 (six) hours as needed for wheezing or shortness of breath.  . alprazolam (XANAX) 2 MG tablet Take 1-2 mg by mouth  4 (four) times daily as needed for anxiety.   Marland Kitchen amiodarone (PACERONE) 200 MG tablet TAKE 1 TABLET(200 MG) BY MOUTH DAILY  . amLODipine (NORVASC) 10 MG tablet TAKE 1 TABLET BY MOUTH EVERY DAY  . aspirin 81 MG EC tablet Take 81 mg by mouth daily.    . budesonide-formoterol (SYMBICORT) 160-4.5 MCG/ACT inhaler Inhale 2 puffs into the lungs 2 (two) times daily.  . carvedilol (COREG) 6.25 MG tablet TAKE 1 TABLET BY MOUTH TWICE DAILY WITH A MEAL  . DULoxetine (CYMBALTA) 60 MG capsule Take 60 mg by mouth daily.  . ferrous  sulfate 325 (65 FE) MG tablet Take 325 mg by mouth 2 (two) times daily with a meal. Reported on 06/26/2015  . hydrALAZINE (APRESOLINE) 25 MG tablet Take 1 tablet (25 mg total) by mouth 3 (three) times daily.  . hydrocortisone (ANUSOL-HC) 25 MG suppository Place 1 suppository (25 mg total) 2 (two) times daily as needed rectally for hemorrhoids or anal itching.  . lamoTRIgine (LAMICTAL) 25 MG tablet Take 25-100 mg by mouth at bedtime. Take 25 mg by mouth for 2 weeks the increase to 50 mg by mouth at bedtime for 2 weeks then increase to 4 tablets by mouth at bedtime  . oxcarbazepine (TRILEPTAL) 600 MG tablet Take 2 tablets by mouth at night  . potassium chloride SA (K-DUR,KLOR-CON) 20 MEQ tablet TAKE 2 TABLETS BY MOUTH DAILY  . pravastatin (PRAVACHOL) 40 MG tablet TAKE 1 TABLET BY MOUTH EVERY NIGHT AT BEDTIME  . sildenafil (REVATIO) 20 MG tablet TAKE 2 TO 5 TABLETS BY MOUTH DAILY AS NEEDED PRIOR TO SEXUAL ACTIVITY   . Spacer/Aero-Holding Chambers (AEROCHAMBER MV) inhaler Use as instructed  . XARELTO 20 MG TABS tablet TAKE 1 TABLET BY MOUTH EVERY DAY WITH DINNER  . mometasone (NASONEX) 50 MCG/ACT nasal spray Place 2 sprays into the nose daily.  . RESTASIS MULTIDOSE 0.05 % ophthalmic emulsion Place 1 drop into both eyes 2 (two) times daily.   No facility-administered encounter medications on file as of 04/17/2017.      Review of Systems  Constitutional:   No  weight loss, night sweats,  Fevers, chills, fatigue, or  lassitude.  HEENT:   No headaches,  Difficulty swallowing,  Tooth/dental problems, or  Sore throat,                No sneezing, itching, ear ache,  +nasal congestion, post nasal drip,   CV:  No chest pain,  Orthopnea, PND, swelling in lower extremities, anasarca, dizziness, palpitations, syncope.   GI  No heartburn, indigestion, abdominal pain, nausea, vomiting, diarrhea, change in bowel habits, loss of appetite, bloody stools.   Resp:   No chest wall deformity  Skin: no rash or  lesions.  GU: no dysuria, change in color of urine, no urgency or frequency.  No flank pain, no hematuria   MS:  No joint pain or swelling.  No decreased range of motion.  No back pain.    Physical Exam  BP 132/74 (BP Location: Left Arm, Cuff Size: Normal)   Pulse 80   Ht 6\' 3"  (1.905 m)   Wt 279 lb 12.8 oz (126.9 kg)   SpO2 98%   BMI 34.97 kg/m   GEN: A/Ox3; pleasant , NAD, obese   HEENT:  Winter/AT,  EACs-clear, TMs-wnl, NOSE-clear, THROAT-clear, no lesions, no postnasal drip or exudate noted.   NECK:  Supple w/ fair ROM; no JVD; normal carotid impulses w/o bruits; no thyromegaly or nodules palpated; no lymphadenopathy.  RESP  Clear  P & A; w/o, wheezes/ rales/ or rhonchi. no accessory muscle use, no dullness to percussion  CARD:  RRR, no m/r/g, trace peripheral edema, pulses intact, no cyanosis or clubbing.  GI:   Soft & nt; nml bowel sounds; no organomegaly or masses detected.   Musco: Warm bil, no deformities or joint swelling noted.   Neuro: alert, no focal deficits noted.    Skin: Warm, no lesions or rashes    Lab Results:   BMET   BNP   Imaging: No results found.   Assessment & Plan:   COPD with asthma ([place]) Controlled on current regimen   Plan  Patient Instructions  Continue on Symbicort 2 puffs Twice daily  , rinse after use.  Nasonex 2 puffs daily As needed   Follow up in 4 months in Dr. Lamonte Sakai and As needed         Chronic seasonal allergic rhinitis nasonex As needed       Rexene Edison, NP 04/17/2017

## 2017-04-17 NOTE — Assessment & Plan Note (Signed)
nasonex As needed

## 2017-04-17 NOTE — Assessment & Plan Note (Signed)
Controlled on current regimen   Plan  Patient Instructions  Continue on Symbicort 2 puffs Twice daily  , rinse after use.  Nasonex 2 puffs daily As needed   Follow up in 4 months in Dr. Lamonte Sakai and As needed

## 2017-04-17 NOTE — Patient Instructions (Addendum)
Continue on Symbicort 2 puffs Twice daily  , rinse after use.  Nasonex 2 puffs daily As needed   Follow up in 4 months in Dr. Lamonte Sakai and As needed

## 2017-04-27 ENCOUNTER — Telehealth: Payer: Self-pay | Admitting: Adult Health

## 2017-04-27 NOTE — Telephone Encounter (Signed)
Spoke to pt & made him aware he will need to get his pcp to refer him to dermatologist.  Nothing further needed.

## 2017-04-27 NOTE — Telephone Encounter (Signed)
Pt is returning call back from [facility] regarding referral. Pt request that a detailed message be left if he does not answer.  PCC's please advise. Thanks.

## 2017-05-15 ENCOUNTER — Other Ambulatory Visit: Payer: Self-pay | Admitting: Cardiovascular Disease

## 2017-05-15 DIAGNOSIS — R748 Abnormal levels of other serum enzymes: Secondary | ICD-10-CM

## 2017-05-16 ENCOUNTER — Other Ambulatory Visit: Payer: Medicaid Other | Admitting: *Deleted

## 2017-05-16 DIAGNOSIS — I5022 Chronic systolic (congestive) heart failure: Secondary | ICD-10-CM

## 2017-05-16 DIAGNOSIS — I255 Ischemic cardiomyopathy: Secondary | ICD-10-CM

## 2017-05-16 DIAGNOSIS — K625 Hemorrhage of anus and rectum: Secondary | ICD-10-CM

## 2017-05-16 DIAGNOSIS — I10 Essential (primary) hypertension: Secondary | ICD-10-CM

## 2017-05-17 ENCOUNTER — Telehealth: Payer: Self-pay

## 2017-05-17 LAB — COMPREHENSIVE METABOLIC PANEL
A/G RATIO: 1.2 (ref 1.2–2.2)
ALT: 45 IU/L — ABNORMAL HIGH (ref 0–44)
AST: 42 IU/L — AB (ref 0–40)
Albumin: 4.1 g/dL (ref 3.5–5.5)
Alkaline Phosphatase: 84 IU/L (ref 39–117)
BUN/Creatinine Ratio: 12 (ref 9–20)
BUN: 22 mg/dL (ref 6–24)
Bilirubin Total: 0.6 mg/dL (ref 0.0–1.2)
CO2: 24 mmol/L (ref 20–29)
Calcium: 9.1 mg/dL (ref 8.7–10.2)
Chloride: 100 mmol/L (ref 96–106)
Creatinine, Ser: 1.83 mg/dL — ABNORMAL HIGH (ref 0.76–1.27)
GFR calc Af Amer: 47 mL/min/{1.73_m2} — ABNORMAL LOW (ref >59)
GFR calc non Af Amer: 41 mL/min/{1.73_m2} — ABNORMAL LOW (ref >59)
GLOBULIN, TOTAL: 3.4 g/dL (ref 1.5–4.5)
Glucose: 94 mg/dL (ref 65–99)
POTASSIUM: 4 mmol/L (ref 3.5–5.2)
SODIUM: 138 mmol/L (ref 134–144)
Total Protein: 7.5 g/dL (ref 6.0–8.5)

## 2017-05-17 LAB — CBC
Hematocrit: 46.6 % (ref 37.5–51.0)
Hemoglobin: 16.4 g/dL (ref 13.0–17.7)
MCH: 31.7 pg (ref 26.6–33.0)
MCHC: 35.2 g/dL (ref 31.5–35.7)
MCV: 90 fL (ref 79–97)
Platelets: 215 10*3/uL (ref 150–379)
RBC: 5.18 x10E6/uL (ref 4.14–5.80)
RDW: 14 % (ref 12.3–15.4)
WBC: 9.9 10*3/uL (ref 3.4–10.8)

## 2017-05-17 LAB — LIPID PANEL
CHOLESTEROL TOTAL: 165 mg/dL (ref 100–199)
Chol/HDL Ratio: 3.4 ratio (ref 0.0–5.0)
HDL: 48 mg/dL (ref >39)
LDL Calculated: 97 mg/dL (ref 0–99)
TRIGLYCERIDES: 100 mg/dL (ref 0–149)
VLDL Cholesterol Cal: 20 mg/dL (ref 5–40)

## 2017-05-17 LAB — TSH: TSH: 1.76 u[IU]/mL (ref 0.450–4.500)

## 2017-05-17 NOTE — Telephone Encounter (Signed)
Contacted pt to go over lab results pt didn't answer left a detailed message regarding results  If pt calls back please give results: blood count is normal.

## 2017-05-18 ENCOUNTER — Ambulatory Visit (INDEPENDENT_AMBULATORY_CARE_PROVIDER_SITE_OTHER): Payer: Medicaid Other | Admitting: Cardiovascular Disease

## 2017-05-18 ENCOUNTER — Encounter: Payer: Self-pay | Admitting: Cardiovascular Disease

## 2017-05-18 ENCOUNTER — Encounter (INDEPENDENT_AMBULATORY_CARE_PROVIDER_SITE_OTHER): Payer: Self-pay

## 2017-05-18 VITALS — BP 152/90 | HR 59 | Ht 75.0 in | Wt 281.0 lb

## 2017-05-18 DIAGNOSIS — I480 Paroxysmal atrial fibrillation: Secondary | ICD-10-CM

## 2017-05-18 DIAGNOSIS — I5022 Chronic systolic (congestive) heart failure: Secondary | ICD-10-CM

## 2017-05-18 DIAGNOSIS — I2510 Atherosclerotic heart disease of native coronary artery without angina pectoris: Secondary | ICD-10-CM | POA: Diagnosis not present

## 2017-05-18 MED ORDER — SILDENAFIL CITRATE 20 MG PO TABS: ORAL_TABLET | ORAL | 1 refills | Status: DC

## 2017-05-18 MED ORDER — CARVEDILOL 12.5 MG PO TABS: 12.5000 mg | ORAL_TABLET | Freq: Two times a day (BID) | ORAL | 3 refills | Status: DC

## 2017-05-18 NOTE — Progress Notes (Signed)
Cardiology Office Note Date:  05/18/2017   ID:  AKARI CRYSLER, DOB [birth year], MRN [id number]  PCP:  Ladell Pier, MD  Cardiologist:  [name], MD    Chief Complaint  Patient presents with  . Congestive Heart Failure     History of Present Illness: Cody Ortega is a 57 y.o. male who presents for follow-up evaluation. The patient has a history of chronic systolic heart failure Secondary to nonischemic cardiomyopathy, coronary artery disease, stage III chronic kidney disease, malignant hypertension, and paroxysmal atrial fibrillation managed with a rhythm control strategy using amiodarone.  The patient is here alone today.  He has been doing fairly well.  Having some problems with anxiety and depression related to personal issues.  His son is struggling with drug problems.  The patient reports fairly good compliance with his medications.  He has had no recent chest pain or pressure.  Chronic exertional dyspnea is unchanged.  He denies orthopnea or PND.  No leg swelling or heart palpitations. Past Medical History:  Diagnosis Date  . Anxiety   . Benzodiazepine dependence ([place])   . Bronchial asthma   . CAD (coronary artery disease)    a. Cath 03/2010: mod RCA stenosis, severe diag stenosis, treated medically given lack of angina.  . 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 Chronic systolic congestive heart failure (HCC)    Acute decompensation, LVEF less than 20%  . CKD (chronic kidney disease), stage IV (HCC)    Stage 3-4  . Depression   . History of cocaine abuse   . Hypertension   . Microcytic anemia   . PAF (paroxysmal atrial fibrillation) ([place])    a. Episode in 2008 with spontaneous conversion  . S/P implantation of automatic cardioverter/defibrillator (AICD)    a. Medtronic, implanted 2012.  . Stroke (cerebrum) ([place])   . Stroke [facility]) 2004  . Thyroid disease     Past Surgical History:  Procedure Laterality Date  . CARDIOVERSION N/A 01/07/2014     Procedure: CARDIOVERSION;  Surgeon: Lelon Perla, MD;  Location: [facility] ENDOSCOPY;  Service: Cardiovascular;  Laterality: N/A;  . CARDIOVERSION N/A 02/11/2014   Procedure: CARDIOVERSION;  Surgeon: Pixie Casino, MD;  Location[facility] ENDOSCOPY;  Service: Cardiovascular;  Laterality: N/A;  . PACEMAKER INSERTION    . TEE WITHOUT CARDIOVERSION N/A 01/07/2014   Procedure: TRANSESOPHAGEAL ECHOCARDIOGRAM (TEE);  Surgeon: Lelon Perla, MD;  Location: [facility] ENDOSCOPY;  Service: Cardiovascular;  Laterality: N/A;  . TEE WITHOUT CARDIOVERSION N/A 02/11/2014   Procedure: TRANSESOPHAGEAL ECHOCARDIOGRAM (TEE);  Surgeon: Pixie Casino, MD;  Location: [facility] ENDOSCOPY;  Service: Cardiovascular;  Laterality: N/A;  . TONSILLECTOMY    . TRANSTHORACIC ECHOCARDIOGRAM  10/2006, 12/2009    Current Outpatient Medications  Medication Sig Dispense Refill  . albuterol (VENTOLIN HFA) 108 (90 Base) MCG/ACT inhaler Inhale 2 puffs into the lungs every 6 (six) hours as needed for wheezing or shortness of breath. 8 g 11  . alprazolam (XANAX) 2 MG tablet Take 1-2 mg by mouth 4 (four) times daily as needed for anxiety.     Marland Kitchen amiodarone (PACERONE) 200 MG tablet TAKE 1 TABLET(200 MG) BY MOUTH DAILY 30 tablet 9  . amLODipine (NORVASC) 10 MG tablet TAKE 1 TABLET BY MOUTH EVERY DAY 30 tablet 10  . aspirin 81 MG EC tablet Take 81 mg by mouth daily.      . budesonide-formoterol (SYMBICORT) 160-4.5 MCG/ACT inhaler Inhale 2 puffs into the lungs 2 (two) times daily. 1  Inhaler 4  . carvedilol (COREG) 12.5 MG tablet Take 1 tablet (12.5 mg total) by mouth 2 (two) times daily with a meal. 180 tablet 3  . DULoxetine (CYMBALTA) 60 MG capsule Take 60 mg by mouth daily.    . ferrous sulfate 325 (65 FE) MG tablet Take 325 mg by mouth 2 (two) times daily with a meal. Reported on 06/26/2015    . hydrALAZINE (APRESOLINE) 25 MG tablet Take 1 tablet (25 mg total) by mouth 3 (three) times daily. 90 tablet 11  . hydrocortisone (ANUSOL-HC) 25 MG  suppository Place 1 suppository (25 mg total) 2 (two) times daily as needed rectally for hemorrhoids or anal itching. 12 suppository 2  . lamoTRIgine (LAMICTAL) 25 MG tablet Take 25-100 mg by mouth at bedtime. Take 25 mg by mouth for 2 weeks the increase to 50 mg by mouth at bedtime for 2 weeks then increase to 4 tablets by mouth at bedtime  0  . mometasone (NASONEX) 50 MCG/ACT nasal spray Place 2 sprays into the nose daily. 17 g 5  . oxcarbazepine (TRILEPTAL) 600 MG tablet Take 2 tablets by mouth at night  1  . potassium chloride SA (K-DUR,KLOR-CON) 20 MEQ tablet TAKE 2 TABLETS BY MOUTH DAILY 180 tablet 2  . pravastatin (PRAVACHOL) 40 MG tablet TAKE 1 TABLET BY MOUTH EVERY NIGHT AT BEDTIME 90 tablet 2  . sildenafil (REVATIO) 20 MG tablet TAKE 2 TO 5 TABLETS BY MOUTH DAILY AS NEEDED PRIOR TO SEXUAL ACTIVITY 100 tablet 1  . Spacer/Aero-Holding Chambers (AEROCHAMBER MV) inhaler Use as instructed 1 each 0  . XARELTO 20 MG TABS tablet TAKE 1 TABLET BY MOUTH EVERY DAY WITH DINNER 30 tablet 8   No current facility-administered medications for this visit.     Allergies:   Codeine and Hydrocodone-acetaminophen   Social History:  The patient  reports that he has quit smoking. His smoking use included cigarettes. He has a 0.25 pack-year smoking history. he has never used smokeless tobacco. He reports that he drinks alcohol. He reports that he does not use drugs.   Family History:  The patient's  family history includes Breast cancer in his mother; Cerebral palsy in his daughter; Diabetes in his father; Multiple myeloma in his mother; Peripheral vascular disease in his father; Prostate cancer in his father.    ROS:  Please see the history of present illness.  Otherwise, review of systems is positive for visual changes, back pain, muscle pain, easy bruising, excessive fatigue, snoring, wheezing, constipation, nausea, anxiety, balance problems.  All other systems are reviewed and negative.    PHYSICAL  EXAM: VS:  BP (!) 152/90   Pulse (!) 59   Ht '6\' 3"'$  (1.905 m)   Wt 281 lb (127.5 kg)   BMI 35.12 kg/m  , BMI Body mass index is 35.12 kg/m. GEN: Well nourished, well developed, in no acute distress  HEENT: normal  Neck: no JVD, no masses. No carotid bruits Cardiac: RRR without murmur or gallop                Respiratory:  clear to auscultation bilaterally, normal work of breathing GI: soft, nontender, nondistended, + BS MS: no deformity or atrophy  Ext: no pretibial edema, pedal pulses 2+= bilaterally Skin: warm and dry, no rash Neuro:  Strength and sensation are intact Psych: euthymic mood, full affect  EKG:  EKG is ordered today. The ekg ordered today shows normal sinus rhythm 59 bpm, low voltage QRS, nonspecific IVCD, otherwise within  normal limits.  Recent Labs: 05/16/2017: ALT 45; BUN 22; Creatinine, Ser 1.83; Hemoglobin 16.4; Platelets 215; Potassium 4.0; Sodium 138; TSH 1.760   Lipid Panel     Component Value Date/Time   CHOL 165 05/16/2017 1353   TRIG 100 05/16/2017 1353   HDL 48 05/16/2017 1353   CHOLHDL 3.4 05/16/2017 1353   CHOLHDL 3.9 10/12/2015 1026   VLDL 27 10/12/2015 1026   LDLCALC 97 05/16/2017 1353      Wt Readings from Last 3 Encounters:  05/18/17 281 lb (127.5 kg)  04/17/17 279 lb 12.8 oz (126.9 kg)  03/17/17 279 lb 12.8 oz (126.9 kg)     Cardiac Studies Reviewed: 2D echocardiogram 10/28/2015: Study Conclusions  - Left ventricle: The cavity size was normal. Wall thickness was   increased in a pattern of moderate LVH. The estimated ejection   fraction was 35%. Diffuse hypokinesis. Doppler parameters are   consistent with abnormal left ventricular relaxation (grade 1   diastolic dysfunction). - Aortic valve: There was no stenosis. - Aorta: Mildly dilated aortic root. Aortic root dimension: 38 mm   (ED). - Mitral valve: Mildly calcified annulus. Mildly calcified leaflets   . There was no significant regurgitation. - Left atrium: The atrium was  moderately dilated. - Right ventricle: Poorly visualized. The cavity size was normal.   Pacer wire or catheter noted in right ventricle. Systolic   function was normal. - Right atrium: The atrium was mildly dilated. - Tricuspid valve: Peak RV-RA gradient (S): 23 mm Hg. - Pulmonary arteries: PA peak pressure: 38 mm Hg (S). - Systemic veins: IVC measured 2.2 cm with < 50% respirophasic   variation, suggesting RA pressure 15 mmHg.  Impressions:  - Technically difficult study with poor acoustic windows. Echo   contrast may have helped but was not used. Normal LV size with   moderate LV hypertrophy. Diffuse hypokinesis, EF 35%. Normal RV   size and systolic function (poorly visualized).   ASSESSMENT AND PLAN: 1.  Chronic systolic heart failure, New York Heart Association functional class II symptoms.  The patient appears euvolemic on exam.  His medical program is reviewed.  I recommended increasing carvedilol to 12.5 mg twice daily.  He will continue hydralazine.  He has not been able to tolerate an ACE inhibitor because of worsening of his renal function.  2.  Paroxysmal atrial fibrillation: Continue amiodarone for rhythm control and Xarelto for anticoagulation.  Thyroid studies and LFTs reviewed and within normal limits except for mild elevation of transaminases.    6.  Hyperlipidemia: treated with pravastatin. Most recent lipids reviewed.   3.  Coronary artery disease, native vessel, without angina: The patient is treated with aspirin 81 mg daily.  He takes a beta-blocker and a statin drug.  4.  Hypertension: Suboptimal blood pressure control.  Carvedilol will be increased to 12.5 mg twice daily.  He will continue on amlodipine and hydralazine.  5.  Chronic kidney disease stage III: Most recent labs are reviewed.  Treated with pravastatin based on tolerance.  Most recent lipids reviewed. Current medicines are reviewed with the patient today.  The patient does not have concerns  regarding medicines.  Labs/ tests ordered today include:  No orders of the defined types were placed in this encounter.   Disposition:   FU 6 months  Signed, [name], MD  05/18/2017 5:59 PM    [facility] [place], [place],   [address] Phone: [phone]; Fax: [phone]

## 2017-05-18 NOTE — Patient Instructions (Signed)
Medication Instructions:  1) INCREASE COREG to 12.5 mg twice daily  Labwork: None  Testing/Procedures: None  Follow-Up: Your provider wants you to follow-up in: 6 months with Dr. Burt Knack. You will receive a reminder letter in the mail two months in advance. If you don't receive a letter, please call our office to schedule the follow-up appointment.    Any Other Special Instructions Will Be Listed Below (If Applicable).     If you need a refill on your cardiac medications before your next appointment, please call your pharmacy.

## 2017-05-20 ENCOUNTER — Emergency Department (HOSPITAL_COMMUNITY): Payer: Medicaid Other

## 2017-05-20 ENCOUNTER — Encounter (HOSPITAL_COMMUNITY): Payer: Self-pay

## 2017-05-20 ENCOUNTER — Emergency Department (HOSPITAL_COMMUNITY)
Admission: EM | Admit: 2017-05-20 | Discharge: 2017-05-20 | Disposition: A | Discharge status: Home / Self Care | Payer: Medicaid Other | Attending: Emergency Medicine | Admitting: Emergency Medicine

## 2017-05-20 ENCOUNTER — Other Ambulatory Visit: Payer: Self-pay

## 2017-05-20 DIAGNOSIS — I2510 Atherosclerotic heart disease of native coronary artery without angina pectoris: Secondary | ICD-10-CM | POA: Insufficient documentation

## 2017-05-20 DIAGNOSIS — N184 Chronic kidney disease, stage 4 (severe): Secondary | ICD-10-CM | POA: Insufficient documentation

## 2017-05-20 DIAGNOSIS — R1011 Right upper quadrant pain: Secondary | ICD-10-CM | POA: Diagnosis present

## 2017-05-20 DIAGNOSIS — I130 Hypertensive heart and chronic kidney disease with heart failure and stage 1 through stage 4 chronic kidney disease, or unspecified chronic kidney disease: Secondary | ICD-10-CM | POA: Diagnosis not present

## 2017-05-20 DIAGNOSIS — Z7901 Long term (current) use of anticoagulants: Secondary | ICD-10-CM | POA: Insufficient documentation

## 2017-05-20 DIAGNOSIS — I5022 Chronic systolic (congestive) heart failure: Secondary | ICD-10-CM | POA: Diagnosis not present

## 2017-05-20 DIAGNOSIS — J45909 Unspecified asthma, uncomplicated: Secondary | ICD-10-CM | POA: Insufficient documentation

## 2017-05-20 DIAGNOSIS — J449 Chronic obstructive pulmonary disease, unspecified: Secondary | ICD-10-CM | POA: Diagnosis not present

## 2017-05-20 DIAGNOSIS — R109 Unspecified abdominal pain: Secondary | ICD-10-CM

## 2017-05-20 DIAGNOSIS — Z79899 Other long term (current) drug therapy: Secondary | ICD-10-CM | POA: Diagnosis not present

## 2017-05-20 DIAGNOSIS — Z7982 Long term (current) use of aspirin: Secondary | ICD-10-CM | POA: Insufficient documentation

## 2017-05-20 DIAGNOSIS — Z87891 Personal history of nicotine dependence: Secondary | ICD-10-CM | POA: Diagnosis not present

## 2017-05-20 LAB — COMPREHENSIVE METABOLIC PANEL
ALK PHOS: 75 U/L (ref 38–126)
ALT: 40 U/L (ref 17–63)
AST: 34 U/L (ref 15–41)
Albumin: 3.8 g/dL (ref 3.5–5.0)
Anion gap: 10 (ref 5–15)
BUN: 20 mg/dL (ref 6–20)
CALCIUM: 8.9 mg/dL (ref 8.9–10.3)
CHLORIDE: 103 mmol/L (ref 101–111)
CO2: 23 mmol/L (ref 22–32)
CREATININE: 1.81 mg/dL — AB (ref 0.61–1.24)
GFR calc Af Amer: 47 mL/min — ABNORMAL LOW (ref >60)
GFR, EST NON AFRICAN AMERICAN: 41 mL/min — AB (ref >60)
Glucose, Bld: 113 mg/dL — ABNORMAL HIGH (ref 65–99)
Potassium: 4 mmol/L (ref 3.5–5.1)
Sodium: 136 mmol/L (ref 135–145)
Total Bilirubin: 1.1 mg/dL (ref 0.3–1.2)
Total Protein: 7.9 g/dL (ref 6.5–8.1)

## 2017-05-20 LAB — URINALYSIS, ROUTINE W REFLEX MICROSCOPIC
BILIRUBIN URINE: NEGATIVE
Bacteria, UA: NONE SEEN
GLUCOSE, UA: NEGATIVE mg/dL
HGB URINE DIPSTICK: NEGATIVE
KETONES UR: NEGATIVE mg/dL
LEUKOCYTES UA: NEGATIVE
NITRITE: NEGATIVE
PH: 7 (ref 5.0–8.0)
Protein, ur: 100 mg/dL — AB
SPECIFIC GRAVITY, URINE: 1.011 (ref 1.005–1.030)
Squamous Epithelial / LPF: NONE SEEN

## 2017-05-20 LAB — CBC
HCT: 48 % (ref 39.0–52.0)
Hemoglobin: 17.3 g/dL — ABNORMAL HIGH (ref 13.0–17.0)
MCH: 33.3 pg (ref 26.0–34.0)
MCHC: 36 g/dL (ref 30.0–36.0)
MCV: 92.5 fL (ref 78.0–100.0)
PLATELETS: 219 10*3/uL (ref 150–400)
RBC: 5.19 MIL/uL (ref 4.22–5.81)
RDW: 13.6 % (ref 11.5–15.5)
WBC: 14.3 10*3/uL — ABNORMAL HIGH (ref 4.0–10.5)

## 2017-05-20 LAB — LIPASE, BLOOD: LIPASE: 44 U/L (ref 11–51)

## 2017-05-20 MED ORDER — ONDANSETRON HCL 4 MG/2ML IJ SOLN
4.0000 mg | Freq: Once | INTRAMUSCULAR | Status: AC
  Administered 2017-05-20: 4 mg via INTRAVENOUS
  Filled 2017-05-20: qty 2

## 2017-05-20 MED ORDER — ONDANSETRON 4 MG PO TBDP
4.0000 mg | ORAL_TABLET | Freq: Once | ORAL | Status: AC | PRN
  Administered 2017-05-20: 4 mg via ORAL
  Filled 2017-05-20: qty 1

## 2017-05-20 MED ORDER — OXYCODONE-ACETAMINOPHEN 5-325 MG PO TABS: 1.0000 | ORAL_TABLET | Freq: Four times a day (QID) | ORAL | 0 refills | Status: DC | PRN

## 2017-05-20 MED ORDER — FENTANYL CITRATE (PF) 100 MCG/2ML IJ SOLN
50.0000 ug | Freq: Once | INTRAMUSCULAR | Status: AC
  Administered 2017-05-20: 50 ug via INTRAVENOUS
  Filled 2017-05-20: qty 2

## 2017-05-20 MED ORDER — HYDROMORPHONE HCL 1 MG/ML IJ SOLN
1.0000 mg | Freq: Once | INTRAMUSCULAR | Status: AC
Start: 2017-05-20 — End: 2017-05-20
  Administered 2017-05-20: 1 mg via INTRAVENOUS
  Filled 2017-05-20: qty 1

## 2017-05-20 MED ORDER — ZIPRASIDONE MESYLATE 20 MG IM SOLR: 10.0000 mg | Freq: Once | INTRAMUSCULAR | Status: DC

## 2017-05-20 NOTE — Discharge Instructions (Addendum)
You have some mild inflammation in your right upper abdomen.  This is nonspecific but could represent your gallbladder even though the ultrasound is normal.  It is recommended that you get a HIDA scan which can be arranged by her primary care physician.  If you have any worsening pain, vomiting, fevers or other worsening symptoms, you need to return to the emergency room.

## 2017-05-20 NOTE — ED Notes (Signed)
US at bedside

## 2017-05-20 NOTE — ED Triage Notes (Addendum)
Pt c/o increasing R flank and emesis x 3 days.  Pain score 8/10.  Pt reports he has not urinated today.  Sts the pain initially started as lower abdominal pain and has "settled RLQ."

## 2017-05-20 NOTE — ED Provider Notes (Signed)
[facility] DEPT Provider Note   CSN: [id number] Arrival date & time: 05/20/17  1034     History   Chief Complaint Chief Complaint  Patient presents with  . Flank Pain  . Emesis    HPI Cody Ortega is a 57 y.o. male.  Patient is a 57 year old male who presents with abdominal pain.  He has history of coronary artery disease, CHF, chronic kidney disease, hypertension, prior history of substance abuse.  He states he has had a 2-3-day history of worsening pain to his right upper abdomen.  It started and is periumbilical area and now is localized to his right upper quadrant.  He states he had no appetite for the last few days.  He has had some nausea but no vomiting.  He has had some subjective fevers.  He is having normal bowel movements.  No urinary symptoms.  No history of prior abdominal surgeries.  He does not suggest that the pain is worse with eating although he states he has not had an appetite.  He does say that it waxes and wanes in intensity but is otherwise fairly constant.      Past Medical History:  Diagnosis Date  . Anxiety   . Benzodiazepine dependence ([place])   . Bronchial asthma   . CAD (coronary artery disease)    a. Cath 03/2010: mod RCA stenosis, severe diag stenosis, treated medically given lack of angina.  . 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 Chronic systolic congestive heart failure (HCC)    Acute decompensation, LVEF less than 20%  . CKD (chronic kidney disease), stage IV (HCC)    Stage 3-4  . Depression   . History of cocaine abuse   . Hypertension   . Microcytic anemia   . PAF (paroxysmal atrial fibrillation) ([place])    a. Episode in 2008 with spontaneous conversion  . S/P implantation of automatic cardioverter/defibrillator (AICD)    a. Medtronic, implanted 2012.  . Stroke (cerebrum) ([place])   . Stroke [facility] 2004  . Thyroid disease     Patient Active Problem List   Diagnosis Date Noted  . COPD with  asthma ([place]) 03/17/2016  . Chronic seasonal allergic rhinitis 03/17/2016  . GERD (gastroesophageal reflux disease) 03/17/2016  . Chronic kidney disease (CKD), stage IV (severe) ([place]) 03/17/2016  . Chronic pain syndrome 03/16/2015  . Obstructive sleep apnea 03/16/2015  . Chronic low back pain with right-sided sciatica 03/16/2015  . Sinus brady-tachy syndrome ([place]) 02/20/2014  . Atrial fibrillation ([place]) 12/31/2013  . Automatic implantable cardioverter-defibrillator in situ 03/08/2011  . Erectile dysfunction 02/28/2011  . CAD, NATIVE VESSEL 04/26/2010  . PURE HYPERCHOLESTEROLEMIA 04/12/2010  . HLD (hyperlipidemia) 01/11/2010  . Essential hypertension, malignant 12/18/2009  . Chronic systolic heart failure ([place]) 12/18/2009    Past Surgical History:  Procedure Laterality Date  . CARDIOVERSION N/A 01/07/2014   Procedure: CARDIOVERSION;  Surgeon: Lelon Perla, MD;  Location: [facility] ENDOSCOPY;  Service: Cardiovascular;  Laterality: N/A;  . CARDIOVERSION N/A 02/11/2014   Procedure: CARDIOVERSION;  Surgeon: Pixie Casino, MD;  Location: [facility] ENDOSCOPY;  Service: Cardiovascular;  Laterality: N/A;  . PACEMAKER INSERTION    . TEE WITHOUT CARDIOVERSION N/A 01/07/2014   Procedure: TRANSESOPHAGEAL ECHOCARDIOGRAM (TEE);  Surgeon: Lelon Perla, MD;  Location: [facility] ENDOSCOPY;  Service: Cardiovascular;  Laterality: N/A;  . TEE WITHOUT CARDIOVERSION N/A 02/11/2014   Procedure: TRANSESOPHAGEAL ECHOCARDIOGRAM (TEE);  Surgeon: Pixie Casino, MD;  Location: [place];  Service: Cardiovascular;  Laterality: N/A;  . TONSILLECTOMY    . TRANSTHORACIC ECHOCARDIOGRAM  10/2006, 12/2009       Home Medications    Prior to Admission medications   Medication Sig Start Date End Date Taking? Authorizing Provider  albuterol (VENTOLIN HFA) 108 (90 Base) MCG/ACT inhaler Inhale 2 puffs into the lungs every 6 (six) hours as needed for wheezing or shortness of breath. 07/09/15  Yes Funches, Josalyn, MD  alprazolam  Duanne Moron) 2 MG tablet Take 2 mg by mouth every 6 (six) hours as needed for anxiety.    Yes [provider]  amiodarone (PACERONE) 200 MG tablet TAKE 1 TABLET(200 MG) BY MOUTH DAILY 12/29/16  Yes [name], MD  amLODipine (NORVASC) 10 MG tablet TAKE 1 TABLET BY MOUTH EVERY DAY Patient taking differently: TAKE 1 TABLET (41m) BY MOUTH EVERY DAY 11/14/16  Yes CSherren Mocha MD  aspirin 81 MG EC tablet Take 81 mg by mouth at bedtime.    Yes [provider]  budesonide-formoterol (SYMBICORT) 160-4.5 MCG/ACT inhaler Inhale 2 puffs into the lungs 2 (two) times daily. 10/05/16  Yes NJavier Glazier MD  carvedilol (COREG) 6.25 MG tablet Take 6.25 mg by mouth 2 (two) times daily with a meal.   Yes [provider]  DULoxetine (CYMBALTA) 60 MG capsule Take 60 mg by mouth daily.   Yes [provider]  ferrous sulfate 325 (65 FE) MG tablet Take 325 mg by mouth daily with breakfast.    Yes [provider]  fluticasone (FLONASE) 50 MCG/ACT nasal spray Place 1 spray into both nostrils daily.   Yes [provider]  hydrALAZINE (APRESOLINE) 25 MG tablet Take 1 tablet (25 mg total) by mouth 3 (three) times daily. 10/10/16  Yes CSherren Mocha MD  hydrocortisone (ANUSOL-HC) 25 MG suppository Place 1 suppository (25 mg total) 2 (two) times daily as needed rectally for hemorrhoids or anal itching. 03/17/17  Yes JLadell Pier MD  lamoTRIgine (LAMICTAL) 200 MG tablet Take 200 mg by mouth at bedtime.   Yes [provider]  oxcarbazepine (TRILEPTAL) 600 MG tablet Take 2 tablets by mouth at bedtime 12/02/15  Yes [provider]  potassium chloride SA (K-DUR,KLOR-CON) 20 MEQ tablet TAKE 2 TABLETS BY MOUTH DAILY Patient taking differently: TAKE 2 TABLETS (267m) BY MOUTH DAILY 03/20/17  Yes CoSherren MochaMD  pravastatin (PRAVACHOL) 40 MG tablet TAKE 1 TABLET BY MOUTH EVERY NIGHT AT BEDTIME Patient taking differently: TAKE 1 TABLET (4038mBY MOUTH  EVERY NIGHT AT BEDTIME 05/16/17  Yes CooSherren MochaD  sildenafil (REVATIO) 20 MG tablet TAKE 2 TO 5 TABLETS BY MOUTH DAILY AS NEEDED PRIOR TO SEXUAL ACTIVITY 05/18/17  Yes CooSherren MochaD  XARELTO 20 MG TABS tablet TAKE 1 TABLET BY MOUTH EVERY DAY WITH DINNER 02/16/17  Yes CooSherren MochaD  carvedilol (COREG) 12.5 MG tablet Take 1 tablet (12.5 mg total) by mouth 2 (two) times daily with a meal. 05/18/17 05/13/18  CooSherren MochaD  mometasone (NASONEX) 50 MCG/ACT nasal spray Place 2 sprays into the nose daily. 04/17/17   Parrett, TamFonnie MuP  oxyCODONE-acetaminophen (PERCOCET) 5-325 MG tablet Take 1-2 tablets by mouth every 6 (six) hours as needed. 05/20/17   BelMalvin JohnsD  Spacer/Aero-Holding Chambers (AEROCHAMBER MV) inhaler Use as instructed 03/17/16   NesJavier GlazierD    Family History Family History  Problem Relation Age of Onset  . Breast cancer Mother   . Multiple myeloma Mother   . Prostate cancer Father   .  Diabetes Father   . Peripheral vascular disease Father   . Cerebral palsy Daughter   . Lung disease Neg Hx     Social History Social History   Tobacco Use  . Smoking status: Former Smoker    Packs/day: 0.50    Years: 0.50    Pack years: 0.25    Types: Cigarettes  . Smokeless tobacco: Never Used  . Tobacco comment: Significant Second-hand and only 3 months himself  Substance Use Topics  . Alcohol use: Yes    Alcohol/week: 0.0 oz    Comment: occ  . Drug use: No    Comment: Reported history of cocaine abuse off and on      Allergies   Codeine and Hydrocodone-acetaminophen   Review of Systems Review of Systems  Constitutional: Positive for appetite change and fever. Negative for chills, diaphoresis and fatigue.  HENT: Negative for congestion, rhinorrhea and sneezing.   Eyes: Negative.   Respiratory: Negative for cough, chest tightness and shortness of breath.   Cardiovascular: Negative for chest pain and leg swelling.  Gastrointestinal:  Positive for abdominal pain and nausea. Negative for blood in stool, diarrhea and vomiting.  Genitourinary: Negative for difficulty urinating, flank pain, frequency and hematuria.  Musculoskeletal: Negative for arthralgias and back pain.  Skin: Negative for rash.  Neurological: Negative for dizziness, speech difficulty, weakness, numbness and headaches.     Physical Exam Updated Vital Signs BP 127/81 (BP Location: Left Arm)   Pulse 63   Temp 98.3 F (36.8 C) (Oral)   Resp 18   SpO2 98%   Physical Exam  Constitutional: He is oriented to person, place, and time. He appears well-developed and well-nourished.  HENT:  Head: Normocephalic and atraumatic.  Eyes: Pupils are equal, round, and reactive to light.  Neck: Normal range of motion. Neck supple.  Cardiovascular: Normal rate, regular rhythm and normal heart sounds.  Pulmonary/Chest: Effort normal and breath sounds normal. No respiratory distress. He has no wheezes. He has no rales. He exhibits no tenderness.  Abdominal: Soft. Bowel sounds are normal. There is tenderness (Positive tenderness the right upper abdomen). There is no rebound and no guarding.  Musculoskeletal: Normal range of motion. He exhibits no edema.  Lymphadenopathy:    He has no cervical adenopathy.  Neurological: He is alert and oriented to person, place, and time.  Skin: Skin is warm and dry. No rash noted.  Psychiatric: He has a normal mood and affect.     ED Treatments / Results  Labs (all labs ordered are listed, but only abnormal results are displayed) Labs Reviewed  COMPREHENSIVE METABOLIC PANEL - Abnormal; Notable for the following components:      Result Value   Glucose, Bld 113 (*)    Creatinine, Ser 1.81 (*)    GFR calc non Af Amer 41 (*)    GFR calc Af Amer 47 (*)    All other components within normal limits  CBC - Abnormal; Notable for the following components:   WBC 14.3 (*)    Hemoglobin 17.3 (*)    All other components within normal  limits  URINALYSIS, ROUTINE W REFLEX MICROSCOPIC - Abnormal; Notable for the following components:   Protein, ur 100 (*)    All other components within normal limits  LIPASE, BLOOD    EKG  EKG Interpretation None       Radiology US Abdomen Complete  Result Date: 05/20/2017 CLINICAL DATA:  Abdominal pain.  Right flank pain. EXAM: ABDOMEN ULTRASOUND COMPLETE COMPARISON:  None. FINDINGS: Gallbladder: There is sludge in the gallbladder with no stones, pericholecystic fluid, Murphy's sign, or wall thickening. Common bile duct: Diameter: 3.4 mm Liver: There is a 1.9 cm cyst in the liver. Probable hepatic steatosis. Portal vein is patent on color Doppler imaging with normal direction of blood flow towards the liver. IVC: No abnormality visualized. Pancreas: The pancreatic duct measures up to 3.9 mm in the neck. It is not definitely dilated elsewhere. No focal mass identified. Spleen: Size and appearance within normal limits. Right Kidney: Length: 12.4 cm. Increased cortical echogenicity with no focal mass. Left Kidney: Length: 12.6 cm. Increased cortical echogenicity with no focal mass. Abdominal aorta: Atherosclerotic nonaneurysmal aorta. Other findings: None. IMPRESSION: 1. There is sludge in the gallbladder with no stones, wall thickening, pericholecystic fluid, or Murphy's sign. 2. Probable hepatic steatosis. 3. The pancreatic duct measures up to 3.9 mm, which is mildly dilated, in the neck of the pancreas. It is not definitely dilated elsewhere. The chronicity and significance of this finding is unclear on this study. 4. Medical renal disease. 5. Atherosclerotic changes in the nonaneurysmal aorta. Electronically Signed   By: Dorise Bullion III M.D   On: 05/20/2017 19:01   Ct Renal Stone Study  Result Date: 05/20/2017 CLINICAL DATA:  Right upper quadrant pain. EXAM: CT ABDOMEN AND PELVIS WITHOUT CONTRAST TECHNIQUE: Multidetector CT imaging of the abdomen and pelvis was performed following the  standard protocol without IV contrast. COMPARISON:  Ultrasound from earlier today FINDINGS: Lower chest: No acute abnormality. Hepatobiliary: A hepatic cyst is seen on series 2, image 26. The liver may be minimally nodular with an enlarged caudate raising the possibility of cirrhosis. The findings are not definitive. The gallbladder is mildly distended without obvious wall thickening or stone. No stones or Murphy's sign were seen on ultrasound from earlier today. Pancreas: The pancreas is normal in appearance. No pancreatic ductal dilatation seen on this study. Spleen: No splenomegaly. Adrenals/Urinary Tract: There is a small cyst exophytic off the right kidney. The kidneys are otherwise normal. No stones or masses. No ureterectasis or ureteral stones. The bladder is normal. Stomach/Bowel: The stomach and small bowel are normal. The colon is unremarkable. The appendix is normal. Vascular/Lymphatic: Atherosclerotic changes seen in the nonaneurysmal tortuous aorta extending into the iliac and femoral vessels. Adenopathy is seen in the porta hepatis. A representative node on series 2, image 36 measures 1.8 cm. No other adenopathy. Reproductive: Prostate is unremarkable. Other: There is fat stranding in the right upper quadrant along the undersurface of the right hepatic lobe. The adjacent colon is normal in appearance. The stranding does not appear to arise from the gallbladder, duodenum, or pancreas. No definitive cause for the stranding is noted. No free air or free fluid. A fat containing umbilical hernia is identified. Musculoskeletal: No acute or significant osseous findings. IMPRESSION: 1. There is fat stranding in the right upper quadrant just inferior to the right hepatic lobe. The source of the stranding is unclear as adjacent structures including the right kidney, pancreas, duodenum, gallbladder, and colon do not appear to be the source. The gallbladder is mildly distended and demonstrated sludge on today's  ultrasound. However, there is no Murphy's sign or wall thickening. A HIDA scan could further evaluate the gallbladder if clinically warranted. 2. Suggested cirrhotic changes in the liver. Recommend clinical correlation and correlation with labs. 3. Adenopathy in the porta hepatis. These are nonspecific but could be reactive due to cirrhosis or the inflammation/fat stranding in the right upper quadrant.  Recommend attention on follow-up. 4. Atherosclerosis in the nonaneurysmal aorta and iliac vessels. 5. No other abnormalities. Electronically Signed   By: Dorise Bullion III M.D   On: 05/20/2017 20:52    Procedures Procedures (including critical care time)  Medications Ordered in ED Medications  ondansetron (ZOFRAN-ODT) disintegrating tablet 4 mg (4 mg Oral Given 05/20/17 1128)  fentaNYL (SUBLIMAZE) injection 50 mcg (50 mcg Intravenous Given 05/20/17 1721)  ondansetron (ZOFRAN) injection 4 mg (4 mg Intravenous Given 05/20/17 1721)  HYDROmorphone (DILAUDID) injection 1 mg (1 mg Intravenous Given 05/20/17 1857)     Initial Impression / Assessment and Plan / ED Course  I have reviewed the triage vital signs and the nursing notes.  Pertinent labs & imaging results that were available during my care of the patient were reviewed by me and considered in my medical decision making (see chart for details).    Patient is a 57 year old male who presents with right upper abdominal pain radiating to his back associated with nausea.  Initially I was concerned about his gallbladder.  An ultrasound was performed which was negative.  I did go back and do a CT scan as patient was still uncomfortable.  This showed no evidence of kidney stones.  No other definitive abnormality other than there was some slight stranding to the right upper quadrant.  This was nonspecific although the radiologist felt that a HIDA scan may be beneficial if the gallbladder disease was suspected.  Patient is well-appearing currently.  His pain  is well controlled.  He has no vomiting.  He wants to go home which I feel is reasonable.  He has a mild elevation in his white count but otherwise his labs are non-concerning.  He was discharged home in good condition.  I did give him a prescription for Percocet for pain.  He is a patient of the [facility] and wellness center.  He does not feel that likely he will be able to get an urgent appointment on Monday or Tuesday.  I did put a consult in the case management to try to assist with this appointment.  He was however given strict return precautions to return if he has any worsening pain, vomiting or fevers or any other worsening symptoms.  He was advised to use a clear liquid diet for the next 48 hours.   Final Clinical Impressions(s) / ED Diagnoses   Final diagnoses:  Right flank pain    ED Discharge Orders        Ordered    oxyCODONE-acetaminophen (PERCOCET) 5-325 MG tablet  Every 6 hours PRN     05/20/17 2201       Malvin Johns, MD 05/20/17 2207

## 2017-05-20 NOTE — ED Notes (Signed)
Bed: WLPT3 Expected date:  Expected time:  Means of arrival:  Comments: 

## 2017-05-22 ENCOUNTER — Telehealth: Payer: Self-pay

## 2017-05-22 NOTE — Telephone Encounter (Signed)
Message received from Haze Justin, RN CM requesting a hospital follow up appointment for the patient. An appointment has been scheduled for 05/29/17 @ 1430.  Attempted to contact the patient to confirm the appointment.  Call placed to # [phone] and a HIPAA compliant voice mail message was left requesting a call back to # [phone].   Update provided to Orpha Bur, RN CM

## 2017-05-22 NOTE — Telephone Encounter (Signed)
Call received from the patient and informed him of his appointment at [facility] on 05/29/17 @ 1430.  He was very appreciative of the call.

## 2017-05-22 NOTE — Care Management Note (Signed)
Case Management Note  CM consulted for follow up need with [facility].  CM sent a note to [facility] CM [place] for assistance with possible follow up appointment.  No further CM needs noted at this time.

## 2017-05-29 ENCOUNTER — Ambulatory Visit: Payer: Medicaid Other | Attending: Internal Medicine | Admitting: Physician Assistant

## 2017-05-29 VITALS — BP 179/108 | HR 59 | Temp 97.7°F | Ht 75.0 in | Wt 276.0 lb

## 2017-05-29 DIAGNOSIS — K828 Other specified diseases of gallbladder: Secondary | ICD-10-CM | POA: Diagnosis not present

## 2017-05-29 DIAGNOSIS — Z7982 Long term (current) use of aspirin: Secondary | ICD-10-CM | POA: Insufficient documentation

## 2017-05-29 DIAGNOSIS — F419 Anxiety disorder, unspecified: Secondary | ICD-10-CM | POA: Insufficient documentation

## 2017-05-29 DIAGNOSIS — Z79899 Other long term (current) drug therapy: Secondary | ICD-10-CM | POA: Insufficient documentation

## 2017-05-29 DIAGNOSIS — Z7901 Long term (current) use of anticoagulants: Secondary | ICD-10-CM | POA: Insufficient documentation

## 2017-05-29 DIAGNOSIS — I130 Hypertensive heart and chronic kidney disease with heart failure and stage 1 through stage 4 chronic kidney disease, or unspecified chronic kidney disease: Secondary | ICD-10-CM | POA: Insufficient documentation

## 2017-05-29 DIAGNOSIS — R1011 Right upper quadrant pain: Secondary | ICD-10-CM | POA: Diagnosis not present

## 2017-05-29 DIAGNOSIS — E785 Hyperlipidemia, unspecified: Secondary | ICD-10-CM | POA: Insufficient documentation

## 2017-05-29 DIAGNOSIS — N183 Chronic kidney disease, stage 3 (moderate): Secondary | ICD-10-CM | POA: Insufficient documentation

## 2017-05-29 DIAGNOSIS — J449 Chronic obstructive pulmonary disease, unspecified: Secondary | ICD-10-CM | POA: Insufficient documentation

## 2017-05-29 DIAGNOSIS — R030 Elevated blood-pressure reading, without diagnosis of hypertension: Secondary | ICD-10-CM | POA: Diagnosis not present

## 2017-05-29 NOTE — Progress Notes (Signed)
Referred for a "special scan".

## 2017-05-29 NOTE — Progress Notes (Signed)
Chief Complaint: ED follow up  Subjective: Is a 57 year old male with a history of coronary artery disease, systolic congestive heart failure, cardiomyopathy with ejection fraction 35%, ICD in situ, paroxysmal nature fibrillation on amiodarone and Xarelto, COPD, hypertension, chronic kidney disease stage III, depression mixed with anxiety and hyperlipidemia who is presenting after emergency department visit on 05/20/2017. At that time he presented with abdominal pain. Starting in the periumbilical area and radiated to the right upper quadrant. Aggravated by eating. Decreased appetite. Positive nausea and fevers. Some shortness of breath to the point we felt like he needed to call 911. His saturations were normal. His lipase was normal. Urinalysis normal. White blood cell count 14,000. LFTs actually normal. Ultrasound of the right upper quadrant shows some gallbladder sludge. A CT renal stone imaging showed evidence of possible cirrhosis. They recommended if further indicated to consider HIDA scan.  He was treated with supportive care with pain medications and Zofran.  He was sent home with a prescription for Percocet. He is not taking much of his medications as his symptoms are much better. Less abdominal pain. Still some bloating. Less discomfort. Nausea and vomiting have improved. Formed stools. No blood in the stool. No fevers. No chills.  He further discusses a mole on the right side of his face that he would like addressed by dermatology and bilateral knee issues that he would like to have addressed at some point as well. He will table this discussion until he sees his primary provider.   ROS:  GEN: denies fever or chills, denies change in weight Skin: denies lesions or rashes LUNGS: denies SHOB, dyspnea, PND, orthopnea CV: denies CP or palpitations ABD: + abd pain, N or V NEURO: denies numbness or tingling, denies sz, stroke or TIA   Objective:  Vitals:   05/29/17 1428  BP: (!)  179/108  Pulse: (!) 59  Temp: 97.7 F (36.5 C)  TempSrc: Oral  SpO2: 96%  Weight: 276 lb (125.2 kg)  Height: 6\' 3"  (1.905 m)    Physical Exam: benign  General: in no acute distress. Heart: Normal  s1 &s2  Regular rate and rhythm, without murmurs, rubs, gallops. Lungs: Clear to auscultation bilaterally. Abdomen: Soft, nontender, nondistended, positive bowel sounds.   Medications: Prior to Admission medications   Medication Sig Start Date End Date Taking? Authorizing Provider  albuterol (VENTOLIN HFA) 108 (90 Base) MCG/ACT inhaler Inhale 2 puffs into the lungs every 6 (six) hours as needed for wheezing or shortness of breath. 07/09/15  Yes Funches, Josalyn, MD  alprazolam Duanne Moron) 2 MG tablet Take 2 mg by mouth every 6 (six) hours as needed for anxiety.    Yes [provider]  amiodarone (PACERONE) 200 MG tablet TAKE 1 TABLET(200 MG) BY MOUTH DAILY 12/29/16  Yes [name], MD  amLODipine ([place]) 10 MG tablet TAKE 1 TABLET BY MOUTH EVERY DAY Patient taking differently: TAKE 1 TABLET (10mg ) BY MOUTH EVERY DAY 11/14/16  Yes [name], MD  aspirin 81 MG EC tablet Take 81 mg by mouth at bedtime.    Yes [provider]  budesonide-formoterol (SYMBICORT) 160-4.5 MCG/ACT inhaler Inhale 2 puffs into the lungs 2 (two) times daily. 10/05/16  Yes Javier Glazier, MD  carvedilol (COREG) 12.5 MG tablet Take 1 tablet (12.5 mg total) by mouth 2 (two) times daily with a meal. 05/18/17 05/13/18 Yes [name], MD  DULoxetine (CYMBALTA) 60 MG capsule Take 60 mg by mouth daily.   Yes [provider]  fluticasone Asencion Islam)  50 MCG/ACT nasal spray Place 1 spray into both nostrils daily.   Yes [provider]  hydrALAZINE (APRESOLINE) 25 MG tablet Take 1 tablet (25 mg total) by mouth 3 (three) times daily. 10/10/16  Yes [name], MD  hydrocortisone (ANUSOL-HC) 25 MG suppository Place 1 suppository (25 mg total) 2 (two) times daily as needed rectally for  hemorrhoids or anal itching. 03/17/17  Yes Ladell Pier, MD  lamoTRIgine (LAMICTAL) 200 MG tablet Take 200 mg by mouth at bedtime.   Yes [provider]  oxcarbazepine (TRILEPTAL) 600 MG tablet Take 2 tablets by mouth at bedtime 12/02/15  Yes [provider]  sildenafil (REVATIO) 20 MG tablet TAKE 2 TO 5 TABLETS BY MOUTH DAILY AS NEEDED PRIOR TO SEXUAL ACTIVITY 05/18/17  Yes [name], MD  Spacer/Aero-Holding Chambers (AEROCHAMBER MV) inhaler Use as instructed 03/17/16  Yes Javier Glazier, MD  XARELTO 20 MG TABS tablet TAKE 1 TABLET BY MOUTH EVERY DAY WITH DINNER 02/16/17  Yes [name], MD  carvedilol (COREG) 6.25 MG tablet Take 6.25 mg by mouth 2 (two) times daily with a meal.    [provider]  ferrous sulfate 325 (65 FE) MG tablet Take 325 mg by mouth daily with breakfast.     [provider]  mometasone (NASONEX) 50 MCG/ACT nasal spray Place 2 sprays into the nose daily. 04/17/17   Parrett, Fonnie Mu, NP  oxyCODONE-acetaminophen (PERCOCET) 5-325 MG tablet Take 1-2 tablets by mouth every 6 (six) hours as needed. Patient not taking: Reported on 05/29/2017 05/20/17   Malvin Johns, MD  potassium chloride SA (K-DUR,KLOR-CON) 20 MEQ tablet TAKE 2 TABLETS BY MOUTH DAILY Patient taking differently: TAKE 2 TABLETS (27meq) BY MOUTH DAILY 03/20/17   [name], MD  pravastatin (PRAVACHOL) 40 MG tablet TAKE 1 TABLET BY MOUTH EVERY NIGHT AT BEDTIME Patient taking differently: TAKE 1 TABLET (40mg ) BY MOUTH EVERY NIGHT AT BEDTIME 05/16/17   [name], MD    Assessment: 1. RUQ/Right flank pain-improved   Plan: CBC, if WBC ok will not pursue HIDA scan SX relief/supportive care Follow BP readings (states normal at home; ok in ED as well)  Follow up:2 weeks  The patient was given clear instructions to go to ER or return to medical center if symptoms don't improve, worsen or new problems develop. The patient verbalized understanding. The  patient was told to call to get lab results if they haven't heard anything in the next week.   This note has been created with Surveyor, quantity. Any transcriptional errors are unintentional.   Zettie Pho, PA-C 05/29/2017, 3:01 PM

## 2017-05-30 ENCOUNTER — Other Ambulatory Visit: Payer: Medicaid Other | Admitting: *Deleted

## 2017-05-30 ENCOUNTER — Other Ambulatory Visit: Payer: Self-pay | Admitting: Physician Assistant

## 2017-05-31 LAB — CBC WITH DIFFERENTIAL/PLATELET
BASOS: 0 %
Basophils Absolute: 0 10*3/uL (ref 0.0–0.6)
EOS (ABSOLUTE): 0.1 10*3/uL (ref 0.0–0.6)
EOS: 1 %
HEMATOCRIT: 46.5 % (ref 31.9–57.2)
Hemoglobin: 16.1 g/dL (ref 10.7–20.5)
Immature Grans (Abs): 0 10*3/uL
Immature Granulocytes: 0 %
LYMPHS ABS: 1.6 10*3/uL (ref 0.9–5.0)
Lymphs: 18 %
MCH: 31.6 pg (ref 26.1–38.7)
MCHC: 34.6 g/dL (ref 31.9–36.8)
MCV: 91 fL (ref 79–110)
Monocytes Absolute: 0.8 10*3/uL (ref 0.2–1.3)
Monocytes: 9 %
Neutrophils Absolute: 6.3 10*3/uL — ABNORMAL HIGH (ref 1.2–6.1)
Neutrophils: 72 %
Platelets: 277 10*3/uL (ref 140–396)
RBC: 5.09 x10E6/uL (ref 3.68–5.77)
RDW: 13.8 % (ref 12.1–16.9)
WBC: 8.8 10*3/uL (ref 3.6–12.5)

## 2017-06-01 ENCOUNTER — Other Ambulatory Visit: Payer: Self-pay

## 2017-06-01 DIAGNOSIS — I5022 Chronic systolic (congestive) heart failure: Secondary | ICD-10-CM

## 2017-06-04 ENCOUNTER — Other Ambulatory Visit: Payer: Self-pay | Admitting: Internal Medicine

## 2017-06-04 DIAGNOSIS — K625 Hemorrhage of anus and rectum: Secondary | ICD-10-CM

## 2017-06-12 ENCOUNTER — Encounter: Payer: Self-pay | Admitting: Internal Medicine

## 2017-06-12 ENCOUNTER — Ambulatory Visit: Payer: Medicaid Other | Attending: Internal Medicine | Admitting: Internal Medicine

## 2017-06-12 VITALS — BP 140/90 | HR 54 | Temp 97.5°F | Ht 75.0 in | Wt 274.8 lb

## 2017-06-12 DIAGNOSIS — N183 Chronic kidney disease, stage 3 (moderate): Secondary | ICD-10-CM | POA: Insufficient documentation

## 2017-06-12 DIAGNOSIS — J449 Chronic obstructive pulmonary disease, unspecified: Secondary | ICD-10-CM | POA: Diagnosis not present

## 2017-06-12 DIAGNOSIS — Z9889 Other specified postprocedural states: Secondary | ICD-10-CM | POA: Diagnosis not present

## 2017-06-12 DIAGNOSIS — I5022 Chronic systolic (congestive) heart failure: Secondary | ICD-10-CM | POA: Insufficient documentation

## 2017-06-12 DIAGNOSIS — I130 Hypertensive heart and chronic kidney disease with heart failure and stage 1 through stage 4 chronic kidney disease, or unspecified chronic kidney disease: Secondary | ICD-10-CM | POA: Insufficient documentation

## 2017-06-12 DIAGNOSIS — K649 Unspecified hemorrhoids: Secondary | ICD-10-CM | POA: Diagnosis not present

## 2017-06-12 DIAGNOSIS — I10 Essential (primary) hypertension: Secondary | ICD-10-CM

## 2017-06-12 DIAGNOSIS — R948 Abnormal results of function studies of other organs and systems: Secondary | ICD-10-CM | POA: Insufficient documentation

## 2017-06-12 DIAGNOSIS — Z7982 Long term (current) use of aspirin: Secondary | ICD-10-CM | POA: Diagnosis not present

## 2017-06-12 DIAGNOSIS — M170 Bilateral primary osteoarthritis of knee: Secondary | ICD-10-CM

## 2017-06-12 DIAGNOSIS — Z87891 Personal history of nicotine dependence: Secondary | ICD-10-CM | POA: Diagnosis not present

## 2017-06-12 DIAGNOSIS — Z7901 Long term (current) use of anticoagulants: Secondary | ICD-10-CM | POA: Diagnosis not present

## 2017-06-12 DIAGNOSIS — G894 Chronic pain syndrome: Secondary | ICD-10-CM | POA: Insufficient documentation

## 2017-06-12 DIAGNOSIS — Z885 Allergy status to narcotic agent status: Secondary | ICD-10-CM | POA: Diagnosis not present

## 2017-06-12 DIAGNOSIS — Z79899 Other long term (current) drug therapy: Secondary | ICD-10-CM | POA: Insufficient documentation

## 2017-06-12 DIAGNOSIS — K219 Gastro-esophageal reflux disease without esophagitis: Secondary | ICD-10-CM | POA: Insufficient documentation

## 2017-06-12 DIAGNOSIS — G4733 Obstructive sleep apnea (adult) (pediatric): Secondary | ICD-10-CM | POA: Diagnosis not present

## 2017-06-12 DIAGNOSIS — L918 Other hypertrophic disorders of the skin: Secondary | ICD-10-CM | POA: Diagnosis not present

## 2017-06-12 DIAGNOSIS — Z803 Family history of malignant neoplasm of breast: Secondary | ICD-10-CM | POA: Insufficient documentation

## 2017-06-12 DIAGNOSIS — Z8042 Family history of malignant neoplasm of prostate: Secondary | ICD-10-CM | POA: Diagnosis not present

## 2017-06-12 DIAGNOSIS — R935 Abnormal findings on diagnostic imaging of other abdominal regions, including retroperitoneum: Secondary | ICD-10-CM

## 2017-06-12 DIAGNOSIS — Z833 Family history of diabetes mellitus: Secondary | ICD-10-CM | POA: Diagnosis not present

## 2017-06-12 MED ORDER — POTASSIUM CHLORIDE 20 MEQ/15ML (10%) PO SOLN: 40.0000 meq | Freq: Every day | ORAL | 6 refills | Status: DC

## 2017-06-12 NOTE — Progress Notes (Signed)
Referral to knee MD
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
## 2017-06-13 ENCOUNTER — Other Ambulatory Visit: Payer: Self-pay | Admitting: Pharmacist

## 2017-06-13 MED ORDER — ALBUTEROL SULFATE HFA 108 (90 BASE) MCG/ACT IN AERS: 2.0000 | INHALATION_SPRAY | Freq: Four times a day (QID) | RESPIRATORY_TRACT | 2 refills | Status: DC | PRN

## 2017-06-16 ENCOUNTER — Telehealth: Payer: Self-pay | Admitting: Adult Health

## 2017-06-16 MED ORDER — BUDESONIDE-FORMOTEROL FUMARATE 160-4.5 MCG/ACT IN AERO: 2.0000 | INHALATION_SPRAY | Freq: Two times a day (BID) | RESPIRATORY_TRACT | 4 refills | Status: DC

## 2017-06-16 NOTE — Telephone Encounter (Signed)
Former [facility] patient Last seen 12.17.18 by TP with recs to follow up in 4 months with RB  Received faxed refill request rom [place] for Symbicort 160 2puffs BID Refills sent  Nothing further needed; will sign off

## 2017-06-19 ENCOUNTER — Ambulatory Visit: Payer: Medicaid Other | Admitting: Internal Medicine

## 2017-07-04 ENCOUNTER — Encounter: Payer: Medicaid Other | Admitting: Internal Medicine

## 2017-07-18 DIAGNOSIS — M25561 Pain in right knee: Secondary | ICD-10-CM | POA: Diagnosis not present

## 2017-08-10 ENCOUNTER — Ambulatory Visit (INDEPENDENT_AMBULATORY_CARE_PROVIDER_SITE_OTHER): Payer: Medicaid Other | Admitting: Family Medicine

## 2017-08-10 VITALS — BP 125/80 | HR 53 | Temp 97.8°F | Ht 75.0 in | Wt 265.4 lb

## 2017-08-10 DIAGNOSIS — D229 Melanocytic nevi, unspecified: Secondary | ICD-10-CM

## 2017-08-10 NOTE — Progress Notes (Signed)
   Subjective:    Patient ID: Cody Ortega , male   DOB: [birth year] , 57 y.o..   MRN: [id number]  HPI  Cody Ortega is here for  Chief Complaint  Patient presents with  . Nevus    facial    1. Moles: Patient notes that he actually has 2 moles that he would like to discuss today.  One is on his right temple and the second is on his right lower neck.  He notes that both these moles have been present for the majority of his life.  He notes that the mole on his right temple seems to have grown bigger and darker over the last 2 years.  Occasionally he will nicked the area and it will bleed.  He denies any pain from either moles.  Denies any history of skin cancer or lesions of similar appearance in other parts of his body.  He has not used any topical medications for his skin issues.  He notes that he was out in the sun a lot as a child but does wear sunscreen faithfully.  Review of Systems: Per HPI.   Past Medical History: Patient Active Problem List   Diagnosis Date Noted  . Osteoarthritis of both knees 06/12/2017  . COPD with asthma ([place]) 03/17/2016  . Chronic seasonal allergic rhinitis 03/17/2016  . GERD (gastroesophageal reflux disease) 03/17/2016  . Chronic kidney disease (CKD), stage IV (severe) ([place]) 03/17/2016  . Chronic pain syndrome 03/16/2015  . Obstructive sleep apnea 03/16/2015  . Chronic low back pain with right-sided sciatica 03/16/2015  . Sinus brady-tachy syndrome ([place]) 02/20/2014  . Atrial fibrillation ([place]) 12/31/2013  . Automatic implantable cardioverter-defibrillator in situ 03/08/2011  . Erectile dysfunction 02/28/2011  . CAD, NATIVE VESSEL 04/26/2010  . PURE HYPERCHOLESTEROLEMIA 04/12/2010  . HLD (hyperlipidemia) 01/11/2010  . Essential hypertension, malignant 12/18/2009  . Chronic systolic heart failure ([place]) 12/18/2009    Medications: reviewed   Social Hx:  reports that he has quit smoking. His smoking use included cigarettes. He has a 0.25  pack-year smoking history. He has never used smokeless tobacco.   Objective:   BP 125/80 (BP Location: Left Arm, Patient Position: Sitting, Cuff Size: Normal)   Pulse (!) 53   Temp 97.8 F (36.6 C) (Oral)   Ht 6\' 3"  (1.905 m)   Wt 265 lb 6.4 oz (120.4 kg)   SpO2 96%   BMI 33.17 kg/m  Physical Exam  Gen: NAD, alert, cooperative with exam, well-appearing Skin: ~1 cm pedunculated hyperpigmented nevus on base of right neck, almost over his shoulder. ~1cm filiform hyperpigmented nevus on right temple on face       Assessment & Plan:   1. Nevus:  Pedunculated nevus of right lower neck and filiform nevus of right temple on face.  Unlikely neoplastic etiology.  Offered excision of nevus to patient.  He wanted to reschedule the procedure so he can mentally prepare himself as he is afraid of needles.  He is also on Xarelto, do not feel that he needs to hold this prior to the procedure.  -Patient will schedule an appointment for removal of the 2 nevus in the future at our clinic  Smitty Cords, MD [place], PGY-3

## 2017-08-10 NOTE — Patient Instructions (Signed)
Thank you for coming in today, it was so nice to see you! Today we talked about:    Skin tags: You have a skin tag on your face in your neck.  We can remove these in the office.  Please schedule an appointment at the front desk for the dermatology clinic to have these removed.  If you have any questions or concerns, please do not hesitate to call the office at [phone]. You can also message me directly via MyChart.   Sincerely,  Smitty Cords, MD

## 2017-08-10 NOTE — Progress Notes (Signed)
S 

## 2017-08-16 ENCOUNTER — Ambulatory Visit: Payer: Medicaid Other | Admitting: Family Medicine

## 2017-10-03 ENCOUNTER — Encounter (INDEPENDENT_AMBULATORY_CARE_PROVIDER_SITE_OTHER): Payer: Self-pay

## 2017-10-03 ENCOUNTER — Ambulatory Visit: Payer: Medicaid Other | Admitting: Internal Medicine

## 2017-10-03 ENCOUNTER — Encounter: Payer: Self-pay | Admitting: Internal Medicine

## 2017-10-03 VITALS — BP 142/86 | HR 55 | Ht 75.0 in | Wt 260.0 lb

## 2017-10-03 DIAGNOSIS — I255 Ischemic cardiomyopathy: Secondary | ICD-10-CM | POA: Diagnosis not present

## 2017-10-03 DIAGNOSIS — I5022 Chronic systolic (congestive) heart failure: Secondary | ICD-10-CM | POA: Diagnosis not present

## 2017-10-03 DIAGNOSIS — I480 Paroxysmal atrial fibrillation: Secondary | ICD-10-CM | POA: Diagnosis not present

## 2017-10-03 DIAGNOSIS — Z95810 Presence of automatic (implantable) cardiac defibrillator: Secondary | ICD-10-CM

## 2017-10-03 DIAGNOSIS — I495 Sick sinus syndrome: Secondary | ICD-10-CM

## 2017-10-03 LAB — CUP PACEART INCLINIC DEVICE CHECK
Battery Voltage: 2.89 V
HIGH POWER IMPEDANCE MEASURED VALUE: 304 Ohm
HIGH POWER IMPEDANCE MEASURED VALUE: 42 Ohm
HighPow Impedance: 53 Ohm
Implantable Lead Location: 753860
Implantable Lead Model: 185
Lead Channel Impedance Value: 323 Ohm
Lead Channel Sensing Intrinsic Amplitude: 6.875 mV
Lead Channel Sensing Intrinsic Amplitude: 8.125 mV
Lead Channel Setting Pacing Amplitude: 2.5 V
Lead Channel Setting Pacing Pulse Width: 0.4 ms
MDC IDC LEAD IMPLANT DT: 20120806
MDC IDC LEAD SERIAL: 345870
MDC IDC MSMT LEADCHNL RV PACING THRESHOLD AMPLITUDE: 1 V
MDC IDC MSMT LEADCHNL RV PACING THRESHOLD PULSEWIDTH: 0.4 ms
MDC IDC PG IMPLANT DT: 20120806
MDC IDC SESS DTM: 20190604170050
MDC IDC SET LEADCHNL RV SENSING SENSITIVITY: 0.3 mV
MDC IDC STAT BRADY RV PERCENT PACED: 0.1 %

## 2017-10-03 NOTE — Patient Instructions (Signed)
Medication Instructions:  Your physician recommends that you continue on your current medications as directed. Please refer to the Current Medication list given to you today.  Labwork: None ordered.  Testing/Procedures: None ordered.  Follow-Up:  Your physician wants you to follow-up in:  3 months with the device clinic for a device check.   Your physician wants you to follow-up in: one year with Dr. Taylor.   You will receive a reminder letter in the mail two months in advance. If you don't receive a letter, please call our office to schedule the follow-up appointment.  Any Other Special Instructions Will Be Listed Below (If Applicable).  If you need a refill on your cardiac medications before your next appointment, please call your pharmacy.   

## 2017-10-03 NOTE — Progress Notes (Signed)
HPI [name] returns today for ongoing evaluation and management of chronic systolic heart failure, persistent atrial fib on amiodarone, s/p ICD insertion. He is a 57 yo man with the above problems with the above problems. He admits to a sedentary lifestyle but has managed to stay out of the hospital.  Allergies  Allergen Reactions  . Codeine Itching, Nausea And Vomiting and Hives  . Hydrocodone-Acetaminophen Itching and Nausea And Vomiting     Current Outpatient Medications  Medication Sig Dispense Refill  . albuterol (PROAIR HFA) 108 (90 Base) MCG/ACT inhaler Inhale 2 puffs into the lungs every 6 (six) hours as needed for wheezing or shortness of breath. 1 Inhaler 2  . alprazolam (XANAX) 2 MG tablet Take 2 mg by mouth every 6 (six) hours as needed for anxiety.     Marland Kitchen amiodarone (PACERONE) 200 MG tablet TAKE 1 TABLET(200 MG) BY MOUTH DAILY 30 tablet 9  . amLODipine (NORVASC) 10 MG tablet TAKE 1 TABLET BY MOUTH EVERY DAY (Patient taking differently: TAKE 1 TABLET ('10mg'$ ) BY MOUTH EVERY DAY) 30 tablet 10  . aspirin 81 MG EC tablet Take 81 mg by mouth at bedtime.     . budesonide-formoterol (SYMBICORT) 160-4.5 MCG/ACT inhaler Inhale 2 puffs into the lungs 2 (two) times daily. 1 Inhaler 4  . carvedilol (COREG) 12.5 MG tablet Take 1 tablet (12.5 mg total) by mouth 2 (two) times daily with a meal. 180 tablet 3  . DULoxetine (CYMBALTA) 60 MG capsule Take 60 mg by mouth daily.    . ferrous sulfate 325 (65 FE) MG tablet Take 325 mg by mouth daily with breakfast.     . fluticasone (FLONASE) 50 MCG/ACT nasal spray Place 1 spray into both nostrils daily.    . hydrALAZINE (APRESOLINE) 25 MG tablet Take 1 tablet (25 mg total) by mouth 3 (three) times daily. 90 tablet 11  . lamoTRIgine (LAMICTAL) 200 MG tablet Take 200 mg by mouth at bedtime.    . mometasone (NASONEX) 50 MCG/ACT nasal spray Place 2 sprays into the nose daily. 17 g 5  . oxcarbazepine (TRILEPTAL) 600 MG tablet Take 2 tablets by  mouth at bedtime  1  . oxyCODONE-acetaminophen (PERCOCET) 5-325 MG tablet Take 1-2 tablets by mouth every 6 (six) hours as needed. 15 tablet 0  . potassium chloride 20 MEQ/15ML (10%) SOLN Take 30 mLs (40 mEq total) by mouth daily. 900 mL 6  . pravastatin (PRAVACHOL) 40 MG tablet TAKE 1 TABLET BY MOUTH EVERY NIGHT AT BEDTIME (Patient taking differently: TAKE 1 TABLET ('40mg'$ ) BY MOUTH EVERY NIGHT AT BEDTIME) 90 tablet 2  . sildenafil (REVATIO) 20 MG tablet TAKE 2 TO 5 TABLETS BY MOUTH DAILY AS NEEDED PRIOR TO SEXUAL ACTIVITY 100 tablet 1  . Spacer/Aero-Holding Chambers (AEROCHAMBER MV) inhaler Use as instructed 1 each 0  . XARELTO 20 MG TABS tablet TAKE 1 TABLET BY MOUTH EVERY DAY WITH DINNER 30 tablet 8   No current facility-administered medications for this visit.      Past Medical History:  Diagnosis Date  . Anxiety   . Benzodiazepine dependence ([place])   . Bronchial asthma   . CAD (coronary artery disease)    a. Cath 03/2010: mod RCA stenosis, severe diag stenosis, treated medically given lack of angina.  . 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 Chronic systolic congestive heart failure (HCC)    Acute decompensation, LVEF less than 20%  . CKD (chronic kidney disease), stage IV ([place])  Stage 3-4  . Depression   . History of cocaine abuse   . Hypertension   . Microcytic anemia   . PAF (paroxysmal atrial fibrillation) ([place])    a. Episode in 2008 with spontaneous conversion  . S/P implantation of automatic cardioverter/defibrillator (AICD)    a. Medtronic, implanted 2012.  . Stroke (cerebrum) ([place])   . Stroke [facility]) 2004  . Thyroid disease     ROS:   All systems reviewed and negative except as noted in the HPI.   Past Surgical History:  Procedure Laterality Date  . CARDIOVERSION N/A 01/07/2014   Procedure: CARDIOVERSION;  Surgeon: Lelon Perla, MD;  Location: [facility] ENDOSCOPY;  Service: Cardiovascular;  Laterality: N/A;  . CARDIOVERSION N/A 02/11/2014   Procedure:  CARDIOVERSION;  Surgeon: Pixie Casino, MD;  Location: [facility] ENDOSCOPY;  Service: Cardiovascular;  Laterality: N/A;  . PACEMAKER INSERTION    . TEE WITHOUT CARDIOVERSION N/A 01/07/2014   Procedure: TRANSESOPHAGEAL ECHOCARDIOGRAM (TEE);  Surgeon: Lelon Perla, MD;  Location: [facility] ENDOSCOPY;  Service: Cardiovascular;  Laterality: N/A;  . TEE WITHOUT CARDIOVERSION N/A 02/11/2014   Procedure: TRANSESOPHAGEAL ECHOCARDIOGRAM (TEE);  Surgeon: Pixie Casino, MD;  Location: [facility] ENDOSCOPY;  Service: Cardiovascular;  Laterality: N/A;  . TONSILLECTOMY    . TRANSTHORACIC ECHOCARDIOGRAM  10/2006, 12/2009     Family History  Problem Relation Age of Onset  . Breast cancer Mother   . Multiple myeloma Mother   . Prostate cancer Father   . Diabetes Father   . Peripheral vascular disease Father   . Cerebral palsy Daughter   . Lung disease Neg Hx      Social History   Socioeconomic History  . Marital status: Divorced    Spouse name: Not on file  . Number of children: Not on file  . Years of education: Not on file  . Highest education level: Not on file  Occupational History  . 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 Financial resource strain: Not on file  . Food insecurity:    Worry: Not on file    Inability: Not on file  . Transportation needs:    Medical: Not on file    Non-medical: Not on file  Tobacco Use  . Smoking status: Former Smoker    Packs/day: 0.50    Years: 0.50    Pack years: 0.25    Types: Cigarettes  . Smokeless tobacco: Never Used  . Tobacco comment: Significant Second-hand and only 3 months himself  Substance and Sexual Activity  . Alcohol use: Yes    Alcohol/week: 0.0 oz    Comment: occ  . Drug use: No    Comment: Reported history of cocaine abuse off and on   . Sexual activity: Not on file  Lifestyle  . Physical activity:    Days per week: Not on file    Minutes per session: Not on file  .  Stress: Not on file  Relationships  . Social connections:    Talks on phone: Not on file    Gets together: Not on file    Attends religious service: Not on file    Active member of club or organization: Not on file    Attends meetings of clubs or organizations: Not on file    Relationship status: Not on file  . Intimate partner violence:    Fear of current or ex partner: Not on file  Emotionally abused: Not on file    Physically abused: Not on file    Forced sexual activity: Not on file  Other Topics Concern  . Not on file  Social History Narrative   Living with a son who is a 26 year old.  He is not     working, unemployed for 3 years.  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  Denied use of alcohol.  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       [facility]:   Originally from [place], [place]. He grew up in [place]. He moved to [place] in 1985. 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 No pets currently. No bird exposure. He did have mold in a previous home. No hot tub exposure.           BP (!) 142/86   Pulse (!) 55   Ht '6\' 3"'$  (1.905 m)   Wt 260 lb (117.9 kg)   BMI 32.50 kg/m   Physical Exam:  Well appearing NAD HEENT: Unremarkable Neck:  No JVD, no thyromegally Lymphatics:  No adenopathy Back:  No CVA tenderness Lungs:  Clear HEART:  Regular rate rhythm, no murmurs, no rubs, no clicks Abd:  soft, positive bowel sounds, no organomegally, no rebound, no guarding Ext:  2 plus pulses, no edema, no cyanosis, no clubbing Skin:  No rashes no nodules Neuro:  CN II through XII intact, motor grossly intact  EKG - NSR with ILBBB  DEVICE  Normal device function.  See PaceArt for details.   Assess/Plan: 1. Chronic systolic heart failure - his symptoms are class 2. He is well compensated. I strongly encouraged him to maintain a low sodium diet. He will continue his current meds.  2. Atrial fib  - he is maintaining NSR very nicely on low dose amiodarone. Labs checked a few months ago show no evidence of thyroid or liver dysfunction. He does not have a cough.  3. ICD - his Medtronic device is working normally. He will be recheck in several months.  Mikle Bosworth.D.

## 2017-10-09 ENCOUNTER — Encounter: Payer: Self-pay | Admitting: Internal Medicine

## 2017-10-09 ENCOUNTER — Ambulatory Visit: Payer: Medicaid Other | Attending: Internal Medicine | Admitting: Internal Medicine

## 2017-10-09 VITALS — BP 150/81 | HR 58 | Temp 98.0°F | Resp 16 | Wt 260.4 lb

## 2017-10-09 DIAGNOSIS — Z6830 Body mass index (BMI) 30.0-30.9, adult: Secondary | ICD-10-CM | POA: Insufficient documentation

## 2017-10-09 DIAGNOSIS — Z95810 Presence of automatic (implantable) cardiac defibrillator: Secondary | ICD-10-CM | POA: Insufficient documentation

## 2017-10-09 DIAGNOSIS — Z885 Allergy status to narcotic agent status: Secondary | ICD-10-CM | POA: Insufficient documentation

## 2017-10-09 DIAGNOSIS — E7800 Pure hypercholesterolemia, unspecified: Secondary | ICD-10-CM | POA: Diagnosis not present

## 2017-10-09 DIAGNOSIS — G4733 Obstructive sleep apnea (adult) (pediatric): Secondary | ICD-10-CM | POA: Insufficient documentation

## 2017-10-09 DIAGNOSIS — J439 Emphysema, unspecified: Secondary | ICD-10-CM | POA: Diagnosis not present

## 2017-10-09 DIAGNOSIS — Z7982 Long term (current) use of aspirin: Secondary | ICD-10-CM | POA: Insufficient documentation

## 2017-10-09 DIAGNOSIS — K625 Hemorrhage of anus and rectum: Secondary | ICD-10-CM | POA: Diagnosis not present

## 2017-10-09 DIAGNOSIS — F419 Anxiety disorder, unspecified: Secondary | ICD-10-CM | POA: Diagnosis not present

## 2017-10-09 DIAGNOSIS — Z79899 Other long term (current) drug therapy: Secondary | ICD-10-CM | POA: Insufficient documentation

## 2017-10-09 DIAGNOSIS — I5022 Chronic systolic (congestive) heart failure: Secondary | ICD-10-CM | POA: Diagnosis not present

## 2017-10-09 DIAGNOSIS — I130 Hypertensive heart and chronic kidney disease with heart failure and stage 1 through stage 4 chronic kidney disease, or unspecified chronic kidney disease: Secondary | ICD-10-CM | POA: Insufficient documentation

## 2017-10-09 DIAGNOSIS — E669 Obesity, unspecified: Secondary | ICD-10-CM | POA: Insufficient documentation

## 2017-10-09 DIAGNOSIS — Z6379 Other stressful life events affecting family and household: Secondary | ICD-10-CM | POA: Diagnosis not present

## 2017-10-09 DIAGNOSIS — I10 Essential (primary) hypertension: Secondary | ICD-10-CM | POA: Diagnosis not present

## 2017-10-09 DIAGNOSIS — Z1211 Encounter for screening for malignant neoplasm of colon: Secondary | ICD-10-CM | POA: Diagnosis not present

## 2017-10-09 DIAGNOSIS — N183 Chronic kidney disease, stage 3 (moderate): Secondary | ICD-10-CM | POA: Diagnosis not present

## 2017-10-09 DIAGNOSIS — K219 Gastro-esophageal reflux disease without esophagitis: Secondary | ICD-10-CM | POA: Diagnosis not present

## 2017-10-09 DIAGNOSIS — L918 Other hypertrophic disorders of the skin: Secondary | ICD-10-CM | POA: Diagnosis not present

## 2017-10-09 DIAGNOSIS — J302 Other seasonal allergic rhinitis: Secondary | ICD-10-CM

## 2017-10-09 DIAGNOSIS — M170 Bilateral primary osteoarthritis of knee: Secondary | ICD-10-CM | POA: Diagnosis not present

## 2017-10-09 DIAGNOSIS — Z7901 Long term (current) use of anticoagulants: Secondary | ICD-10-CM | POA: Diagnosis not present

## 2017-10-09 DIAGNOSIS — J449 Chronic obstructive pulmonary disease, unspecified: Secondary | ICD-10-CM | POA: Insufficient documentation

## 2017-10-09 DIAGNOSIS — Z87891 Personal history of nicotine dependence: Secondary | ICD-10-CM | POA: Diagnosis not present

## 2017-10-09 DIAGNOSIS — G894 Chronic pain syndrome: Secondary | ICD-10-CM | POA: Diagnosis not present

## 2017-10-09 DIAGNOSIS — I480 Paroxysmal atrial fibrillation: Secondary | ICD-10-CM | POA: Insufficient documentation

## 2017-10-09 DIAGNOSIS — I2510 Atherosclerotic heart disease of native coronary artery without angina pectoris: Secondary | ICD-10-CM | POA: Insufficient documentation

## 2017-10-09 MED ORDER — MOMETASONE FUROATE 50 MCG/ACT NA SUSP: 2.0000 | Freq: Every day | NASAL | 5 refills | Status: DC

## 2017-10-09 NOTE — Progress Notes (Signed)
Patient ID: DAEMIEN FRONCZAK, male    DOB: June 17, 1960  MRN: [id number]  CC: Hypertension   Subjective: Cody Ortega is a 57 y.o. male who presents for chronic ds management. His concerns today include:  The patient with history of COPD and asthma, HTN, CKD stage III, CAD, andNICM with chronic systolic CHF and ICD, PAF,elevator LFT, dep/anx (followed by psychiatrist, Noemi Chapel)  Under a lot of stress dealing with his son who is an IVDA user.  He has tried getting him into a detox and treatment program.  He has had some increased anxiety dealing with him.  He is on Xanax through his psychiatrist.  Saw ortho for his knees. Declined steroid inj. Wanted a "lubrication" shot but not covered by his insurance. Got new braces.  They helped.  He is now a little bit more active.  Saw derm for the nevus RT temple.  Removal recommend but pt declined that day.  He would like a referral back to them to have it taken care of  Hemorrhoids/Colon cancer screen:  Was called by GI but did not call back.  States that he had a lot going on at the time.  He would like for me to resubmit the referral.  Loss 15 lbs since last visit.  Reports this is intentional weight loss.  He has cut back significantly on drinking sodas.  Used to drink several cans a day now down to 2 cans.  COPD:  Struggle with pollen.  Still having nasal stuffiness and rhinorrhea from allergies.  Requests refill on Nasonex nasal spray He is doing good with his inhalers for COPD.  Will see pulmonary tomorrow  NICM: Saw Dr. Lovena Le last week.  Overall he is doing well.  No changes made in medications. Patient Active Problem List   Diagnosis Date Noted  . Osteoarthritis of both knees 06/12/2017  . COPD with asthma ([place]) 03/17/2016  . Chronic seasonal allergic rhinitis 03/17/2016  . GERD (gastroesophageal reflux disease) 03/17/2016  . Chronic kidney disease (CKD), stage IV (severe) ([place]) 03/17/2016  . Chronic pain syndrome 03/16/2015    . Obstructive sleep apnea 03/16/2015  . Chronic low back pain with right-sided sciatica 03/16/2015  . Sinus brady-tachy syndrome ([place]) 02/20/2014  . Atrial fibrillation ([place]) 12/31/2013  . Automatic implantable cardioverter-defibrillator in situ 03/08/2011  . Erectile dysfunction 02/28/2011  . CAD, NATIVE VESSEL 04/26/2010  . PURE HYPERCHOLESTEROLEMIA 04/12/2010  . HLD (hyperlipidemia) 01/11/2010  . Essential hypertension, malignant 12/18/2009  . Chronic systolic heart failure ([place]) 12/18/2009     Current Outpatient Medications on File Prior to Visit  Medication Sig Dispense Refill  . albuterol (PROAIR HFA) 108 (90 Base) MCG/ACT inhaler Inhale 2 puffs into the lungs every 6 (six) hours as needed for wheezing or shortness of breath. 1 Inhaler 2  . alprazolam (XANAX) 2 MG tablet Take 2 mg by mouth every 6 (six) hours as needed for anxiety.     Marland Kitchen amiodarone (PACERONE) 200 MG tablet TAKE 1 TABLET(200 MG) BY MOUTH DAILY 30 tablet 9  . amLODipine (NORVASC) 10 MG tablet TAKE 1 TABLET BY MOUTH EVERY DAY (Patient taking differently: TAKE 1 TABLET (61m) BY MOUTH EVERY DAY) 30 tablet 10  . aspirin 81 MG EC tablet Take 81 mg by mouth at bedtime.     . budesonide-formoterol (SYMBICORT) 160-4.5 MCG/ACT inhaler Inhale 2 puffs into the lungs 2 (two) times daily. 1 Inhaler 4  . carvedilol (COREG) 12.5 MG tablet Take 1 tablet (12.5 mg total) by  mouth 2 (two) times daily with a meal. 180 tablet 3  . DULoxetine (CYMBALTA) 60 MG capsule Take 60 mg by mouth daily.    . ferrous sulfate 325 (65 FE) MG tablet Take 325 mg by mouth daily with breakfast.     . hydrALAZINE (APRESOLINE) 25 MG tablet Take 1 tablet (25 mg total) by mouth 3 (three) times daily. 90 tablet 11  . lamoTRIgine (LAMICTAL) 200 MG tablet Take 200 mg by mouth at bedtime.    Marland Kitchen oxcarbazepine (TRILEPTAL) 600 MG tablet Take 2 tablets by mouth at bedtime  1  . oxyCODONE-acetaminophen (PERCOCET) 5-325 MG tablet Take 1-2 tablets by mouth every 6 (six)  hours as needed. 15 tablet 0  . potassium chloride 20 MEQ/15ML (10%) SOLN Take 30 mLs (40 mEq total) by mouth daily. 900 mL 6  . pravastatin (PRAVACHOL) 40 MG tablet TAKE 1 TABLET BY MOUTH EVERY NIGHT AT BEDTIME (Patient taking differently: TAKE 1 TABLET (20m) BY MOUTH EVERY NIGHT AT BEDTIME) 90 tablet 2  . sildenafil (REVATIO) 20 MG tablet TAKE 2 TO 5 TABLETS BY MOUTH DAILY AS NEEDED PRIOR TO SEXUAL ACTIVITY 100 tablet 1  . Spacer/Aero-Holding Chambers (AEROCHAMBER MV) inhaler Use as instructed 1 each 0  . XARELTO 20 MG TABS tablet TAKE 1 TABLET BY MOUTH EVERY DAY WITH DINNER 30 tablet 8   No current facility-administered medications on file prior to visit.     Allergies  Allergen Reactions  . Codeine Itching, Nausea And Vomiting and Hives  . Hydrocodone-Acetaminophen Itching and Nausea And Vomiting    Social History   Socioeconomic History  . Marital status: Divorced    Spouse name: Not on file  . Number of children: Not on file  . Years of education: Not on file  . Highest education level: Not on file  Occupational History  . Occupation: UMerchandiser, retail UNEMPLOYED    Comment: bWriterin the past  Social Needs  . Financial resource strain: Not on file  . Food insecurity:    Worry: Not on file    Inability: Not on file  . Transportation needs:    Medical: Not on file    Non-medical: Not on file  Tobacco Use  . Smoking status: Former Smoker    Packs/day: 0.50    Years: 0.50    Pack years: 0.25    Types: Cigarettes  . Smokeless tobacco: Never Used  . Tobacco comment: Significant Second-hand and only 3 months himself  Substance and Sexual Activity  . Alcohol use: Yes    Alcohol/week: 0.0 oz    Comment: occ  . Drug use: No    Comment: Reported history of cocaine abuse off and on   . Sexual activity: Not on file  Lifestyle  . Physical activity:    Days per week: Not on file    Minutes per session: Not on file  . Stress: Not on file    Relationships  . Social connections:    Talks on phone: Not on file    Gets together: Not on file    Attends religious service: Not on file    Active member of club or organization: Not on file    Attends meetings of clubs or organizations: Not on file    Relationship status: Not on file  . Intimate partner violence:    Fear of current or ex partner: Not on file    Emotionally abused: Not on file    Physically abused: Not  on file    Forced sexual activity: Not on file  Other Topics Concern  . Not on file  Social History Narrative   Living with a son who is a 55 year old.  He is not     working, unemployed for 3 years.  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  Denied use of alcohol.  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       [facility]:   Originally from [place], [place]. He grew up in [place]. He moved to [place] in 1985. 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 No pets currently. No bird exposure. He did have mold in a previous home. No hot tub exposure.          Family History  Problem Relation Age of Onset  . Breast cancer Mother   . Multiple myeloma Mother   . Prostate cancer Father   . Diabetes Father   . Peripheral vascular disease Father   . Cerebral palsy Daughter   . Lung disease Neg Hx     Past Surgical History:  Procedure Laterality Date  . CARDIOVERSION N/A 01/07/2014   Procedure: CARDIOVERSION;  Surgeon: Lelon Perla, MD;  Location: [facility] ENDOSCOPY;  Service: Cardiovascular;  Laterality: N/A;  . CARDIOVERSION N/A 02/11/2014   Procedure: CARDIOVERSION;  Surgeon: Pixie Casino, MD;  Location: [facility] ENDOSCOPY;  Service: Cardiovascular;  Laterality: N/A;  . PACEMAKER INSERTION    . TEE WITHOUT CARDIOVERSION N/A 01/07/2014   Procedure: TRANSESOPHAGEAL ECHOCARDIOGRAM (TEE);  Surgeon: Lelon Perla, MD;  Location: [facility] ENDOSCOPY;  Service: Cardiovascular;  Laterality: N/A;  . TEE  WITHOUT CARDIOVERSION N/A 02/11/2014   Procedure: TRANSESOPHAGEAL ECHOCARDIOGRAM (TEE);  Surgeon: Pixie Casino, MD;  Location: [facility] ENDOSCOPY;  Service: Cardiovascular;  Laterality: N/A;  . TONSILLECTOMY    . TRANSTHORACIC ECHOCARDIOGRAM  10/2006, 12/2009    ROS: Review of Systems Negative except as stated above PHYSICAL EXAM: BP (!) 150/81   Pulse (!) 58   Temp 98 F (36.7 C) (Oral)   Resp 16   Wt 260 lb 6.4 oz (118.1 kg)   SpO2 95%   BMI 32.55 kg/m    Wt Readings from Last 3 Encounters:  10/09/17 260 lb 6.4 oz (118.1 kg)  10/03/17 260 lb (117.9 kg)  08/10/17 265 lb 6.4 oz (120.4 kg)  Repeat BP 144/80  Physical Exam General appearance - alert, well appearing, and in no distress Mental status - normal mood, behavior, speech, dress, motor activity, and thought processes Nose -mild enlargement of nasal turbinates Chest -few scattered wheezes.  Otherwise lung fields are clear Heart - normal rate, regular rhythm, normal S1, S2, no murmurs, rubs, clicks or gallops Extremities -no lower extremity edema  ASSESSMENT AND PLAN: 1. Essential hypertension Close to goal.  Continue amlodipine, labetalol hydralazine.  DASH diet encouraged  2. Stressful life event affecting family Followed by psychiatry  3. Seasonal allergies Continue Nasonex.  Add over-the-counter Claritin - mometasone (NASONEX) 50 MCG/ACT nasal spray; Place 2 sprays into the nose daily.  Dispense: 17 g; Refill: 5  4. Pulmonary emphysema, unspecified emphysema type ([place]) Stable.  Continue Symbicort and Pro Air  5. Skin tag - Ambulatory referral to Dermatology  6. Rectal bleeding - Ambulatory referral to Gastroenterology  7. Colon cancer screening - Ambulatory referral to Gastroenterology  8. Obesity (BMI 30-39.9) Commended him on weight loss so far.  Dietary counseling given  Patient was given the opportunity to ask questions.  Patient verbalized understanding of the plan and was able to repeat key  elements of the plan.   Orders Placed This Encounter  Procedures  . Ambulatory referral to Gastroenterology  . Ambulatory referral to Dermatology     Requested Prescriptions   Signed Prescriptions Disp Refills  . mometasone (NASONEX) 50 MCG/ACT nasal spray 17 g 5    Sig: Place 2 sprays into the nose daily.    Return in about 4 months (around 02/08/2018).  Karle Plumber, MD, FACP

## 2017-10-10 ENCOUNTER — Ambulatory Visit: Payer: Medicaid Other | Admitting: Internal Medicine

## 2017-10-10 ENCOUNTER — Encounter: Payer: Self-pay | Admitting: Emergency Medicine

## 2017-10-10 ENCOUNTER — Encounter: Payer: Self-pay | Admitting: Gastroenterology

## 2017-10-10 ENCOUNTER — Ambulatory Visit: Payer: Medicaid Other | Admitting: Emergency Medicine

## 2017-10-10 VITALS — BP 160/98 | HR 92 | Ht 75.0 in | Wt 260.0 lb

## 2017-10-10 DIAGNOSIS — I480 Paroxysmal atrial fibrillation: Secondary | ICD-10-CM | POA: Diagnosis not present

## 2017-10-10 DIAGNOSIS — J449 Chronic obstructive pulmonary disease, unspecified: Secondary | ICD-10-CM | POA: Diagnosis not present

## 2017-10-10 DIAGNOSIS — J302 Other seasonal allergic rhinitis: Secondary | ICD-10-CM

## 2017-10-10 NOTE — Patient Instructions (Addendum)
We will continue Symbicort 2 puffs twice a day. Rinse and gargle after using.  Please keep albuterol available to use 2 puffs if needed for shortness of breath, wheezing, chest tightness. Continue to use your Nasacort nasal spray 2 sprays each nostril once daily. Try adding Zyrtec 10 mg daily through the Spring months, remainder of pollen season We will plan to repeat your CXR and pulmonary function testing in December 2019 to follow your status on amiodarone.  Follow with Dr Lamonte Sakai in December with CXR and PFT, or sooner if you have any problems

## 2017-10-10 NOTE — Assessment & Plan Note (Signed)
Continue to use your Nasacort nasal spray 2 sprays each nostril once daily. Try adding Zyrtec 10 mg daily through the Spring months, remainder of pollen season

## 2017-10-10 NOTE — Assessment & Plan Note (Signed)
We will continue Symbicort 2 puffs twice a day. Rinse and gargle after using.  Please keep albuterol available to use 2 puffs if needed for shortness of breath, wheezing, chest tightness.

## 2017-10-10 NOTE — Assessment & Plan Note (Signed)
We will plan to repeat your CXR and pulmonary function testing in December 2019 to follow your status on amiodarone.  Follow with Dr Lamonte Sakai in December with CXR and PFT, or sooner if you have any problems

## 2017-10-10 NOTE — Progress Notes (Signed)
Subjective:    Patient ID: Cody Ortega, male    DOB: 1960-07-25, 57 y.o.   MRN: [id number]  HPI 57 year old former smoker (pack years) with a history of malignant hypertension, atrial fibrillation on amiodarone,Nonischemic cardiomyopathy with an AICD, stage III chronic kidney disease.  He has been followed here by Dr. Ashok Cordia and more recently by [facility] for moderate COPD with some asthmatic features, seasonal allergies, mild GERD.  He had pulmonary function testing performed1/04/2017 that I have reviewed.  This shows moderately severe obstruction without a bronchodilator response, normal lung volumes, mildly decreased diffusion capacity that corrects to the normal range when adjusted for his alveolar volume.  He is currently managed with Symbicort.  His last chest x-ray was 07/26/2016 which I reviewed and which was grossly normal. He reports that he has been doing fairly well, has lost some weight. He has had some increased difficulty with nasal congestion, PND, some worsening of breathing with the pollen. He uses nasonex qd. He is coughing a bit less. Uses albuterol 1-2x a week.    Review of Systems  Past Medical History:  Diagnosis Date  . Anxiety   . Benzodiazepine dependence ([place])   . Bronchial asthma   . CAD (coronary artery disease)    a. Cath 03/2010: mod RCA stenosis, severe diag stenosis, treated medically given lack of angina.  . 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 Chronic systolic congestive heart failure (HCC)    Acute decompensation, LVEF less than 20%  . CKD (chronic kidney disease), stage IV (HCC)    Stage 3-4  . Depression   . History of cocaine abuse   . Hypertension   . Microcytic anemia   . PAF (paroxysmal atrial fibrillation) ([place])    a. Episode in 2008 with spontaneous conversion  . S/P implantation of automatic cardioverter/defibrillator (AICD)    a. Medtronic, implanted 2012.  . Stroke (cerebrum) ([place])   . Stroke [facility]) 2004  . Thyroid disease       Family History  Problem Relation Age of Onset  . Breast cancer Mother   . Multiple myeloma Mother   . Prostate cancer Father   . Diabetes Father   . Peripheral vascular disease Father   . Cerebral palsy Daughter   . Lung disease Neg Hx      Social History   Socioeconomic History  . Marital status: Divorced    Spouse name: Not on file  . Number of children: Not on file  . Years of education: Not on file  . Highest education level: Not on file  Occupational History  . 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 Financial resource strain: Not on file  . Food insecurity:    Worry: Not on file    Inability: Not on file  . Transportation needs:    Medical: Not on file    Non-medical: Not on file  Tobacco Use  . Smoking status: Former Smoker    Packs/day: 0.50    Years: 0.50    Pack years: 0.25    Types: Cigarettes    Last attempt to quit: 05/02/2010    Years since quitting: 7.4  . Smokeless tobacco: Never Used  . Tobacco comment: Significant Second-hand and only 3 months himself  Substance and Sexual Activity  . Alcohol use: Yes    Alcohol/week: 0.0 oz    Comment: occ  . Drug use: No  Comment: Reported history of cocaine abuse off and on   . Sexual activity: Not on file  Lifestyle  . Physical activity:    Days per week: Not on file    Minutes per session: Not on file  . Stress: Not on file  Relationships  . Social connections:    Talks on phone: Not on file    Gets together: Not on file    Attends religious service: Not on file    Active member of club or organization: Not on file    Attends meetings of clubs or organizations: Not on file    Relationship status: Not on file  . Intimate partner violence:    Fear of current or ex partner: Not on file    Emotionally abused: Not on file    Physically abused: Not on file    Forced sexual activity: Not on file  Other Topics Concern  . Not on  file  Social History Narrative   Living with a son who is a 67 year old.  He is not     working, unemployed for 3 years.  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  Denied use of alcohol.  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       [facility]:   Originally from [place], [place]. He grew up in [place]. He moved to [place] in 1985. 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 No pets currently. No bird exposure. He did have mold in a previous home. No hot tub exposure.        Lived in [place], [place].  No significant inhaled exposures.   Allergies  Allergen Reactions  . Codeine Itching, Nausea And Vomiting and Hives  . Hydrocodone-Acetaminophen Itching and Nausea And Vomiting     Outpatient Medications Prior to Visit  Medication Sig Dispense Refill  . albuterol (PROAIR HFA) 108 (90 Base) MCG/ACT inhaler Inhale 2 puffs into the lungs every 6 (six) hours as needed for wheezing or shortness of breath. 1 Inhaler 2  . alprazolam (XANAX) 2 MG tablet Take 2 mg by mouth every 6 (six) hours as needed for anxiety.     Marland Kitchen amiodarone (PACERONE) 200 MG tablet TAKE 1 TABLET(200 MG) BY MOUTH DAILY 30 tablet 9  . amLODipine (NORVASC) 10 MG tablet TAKE 1 TABLET BY MOUTH EVERY DAY (Patient taking differently: TAKE 1 TABLET ('10mg'$ ) BY MOUTH EVERY DAY) 30 tablet 10  . aspirin 81 MG EC tablet Take 81 mg by mouth at bedtime.     . budesonide-formoterol (SYMBICORT) 160-4.5 MCG/ACT inhaler Inhale 2 puffs into the lungs 2 (two) times daily. 1 Inhaler 4  . carvedilol (COREG) 12.5 MG tablet Take 1 tablet (12.5 mg total) by mouth 2 (two) times daily with a meal. 180 tablet 3  . DULoxetine (CYMBALTA) 60 MG capsule Take 60 mg by mouth daily.    . ferrous sulfate 325 (65 FE) MG tablet Take 325 mg by mouth daily with breakfast.     . hydrALAZINE (APRESOLINE) 25 MG tablet Take 1 tablet (25 mg total) by mouth 3 (three) times daily. 90 tablet 11  .  lamoTRIgine (LAMICTAL) 200 MG tablet Take 200 mg by mouth at bedtime.    . mometasone (NASONEX) 50 MCG/ACT nasal spray Place 2 sprays into the nose daily. 17 g 5  . oxcarbazepine (TRILEPTAL) 600 MG tablet Take 2 tablets by mouth at bedtime  1  . potassium chloride 20 MEQ/15ML (10%) SOLN Take 30 mLs (40 mEq total) by mouth daily. 900 mL 6  . pravastatin (PRAVACHOL) 40 MG tablet TAKE 1 TABLET BY MOUTH EVERY NIGHT AT BEDTIME (Patient taking differently: TAKE 1 TABLET ('40mg'$ ) BY MOUTH EVERY NIGHT AT BEDTIME) 90 tablet 2  . sildenafil (REVATIO) 20 MG tablet TAKE 2 TO 5 TABLETS BY MOUTH DAILY AS NEEDED PRIOR TO SEXUAL ACTIVITY 100 tablet 1  . Spacer/Aero-Holding Chambers (AEROCHAMBER MV) inhaler Use as instructed 1 each 0  . XARELTO 20 MG TABS tablet TAKE 1 TABLET BY MOUTH EVERY DAY WITH DINNER 30 tablet 8  . oxyCODONE-acetaminophen (PERCOCET) 5-325 MG tablet Take 1-2 tablets by mouth every 6 (six) hours as needed. 15 tablet 0   No facility-administered medications prior to visit.          Objective:   Physical Exam Vitals:   10/10/17 0934  BP: (!) 160/98  Pulse: 92  SpO2: 99%  Weight: 260 lb (117.9 kg)  Height: '6\' 3"'$  (1.905 m)   Gen: Pleasant, overwt man, in no distress,  normal affect  ENT: No lesions,  mouth clear,  oropharynx clear, no postnasal drip  Neck: No JVD, no stridor  Lungs: No use of accessory muscles, scattered rhonchi bi;laterally  Cardiovascular: RRR, heart sounds normal, no murmur or gallops, no peripheral edema  Musculoskeletal: No deformities, no cyanosis or clubbing  Neuro: alert, non focal  Skin: Warm, no lesions or rash     Assessment & Plan:  COPD with asthma (HCC) We will continue Symbicort 2 puffs twice a day. Rinse and gargle after using.  Please keep albuterol available to use 2 puffs if needed for shortness of breath, wheezing, chest tightness.   Chronic seasonal allergic rhinitis Continue to use your Nasacort nasal spray 2 sprays each nostril  once daily. Try adding Zyrtec 10 mg daily through the Spring months, remainder of pollen season  Atrial fibrillation We will plan to repeat your CXR and pulmonary function testing in December 2019 to follow your status on amiodarone.  Follow with Dr Lamonte Sakai in December with CXR and PFT, or sooner if you have any problems  Baltazar Apo, MD, PhD 10/10/2017, 9:59 AM [facility] [phone] or if no answer [phone]

## 2017-10-21 ENCOUNTER — Other Ambulatory Visit: Payer: Self-pay | Admitting: Cardiovascular Disease

## 2017-10-28 ENCOUNTER — Other Ambulatory Visit: Payer: Self-pay | Admitting: Cardiovascular Disease

## 2017-11-10 ENCOUNTER — Other Ambulatory Visit: Payer: Self-pay | Admitting: Cardiovascular Disease

## 2017-11-10 DIAGNOSIS — D2239 Melanocytic nevi of other parts of face: Secondary | ICD-10-CM | POA: Diagnosis not present

## 2017-11-10 NOTE — Telephone Encounter (Signed)
Okay to refill? Please advise. Thanks, MI 

## 2017-11-14 ENCOUNTER — Other Ambulatory Visit: Payer: Self-pay | Admitting: Cardiovascular Disease

## 2017-11-14 DIAGNOSIS — R0602 Shortness of breath: Secondary | ICD-10-CM

## 2017-11-14 DIAGNOSIS — I5022 Chronic systolic (congestive) heart failure: Secondary | ICD-10-CM

## 2017-11-14 DIAGNOSIS — H16223 Keratoconjunctivitis sicca, not specified as Sjogren's, bilateral: Secondary | ICD-10-CM | POA: Diagnosis not present

## 2017-11-14 DIAGNOSIS — R05 Cough: Secondary | ICD-10-CM

## 2017-11-14 DIAGNOSIS — R059 Cough, unspecified: Secondary | ICD-10-CM

## 2017-12-05 ENCOUNTER — Encounter: Payer: Self-pay | Admitting: Gastroenterology

## 2017-12-05 ENCOUNTER — Other Ambulatory Visit: Payer: Self-pay

## 2017-12-05 ENCOUNTER — Ambulatory Visit: Payer: Medicaid Other | Admitting: Gastroenterology

## 2017-12-05 VITALS — BP 124/74 | HR 60 | Ht 73.5 in | Wt 242.4 lb

## 2017-12-05 DIAGNOSIS — R740 Nonspecific elevation of levels of transaminase and lactic acid dehydrogenase [LDH]: Secondary | ICD-10-CM | POA: Diagnosis not present

## 2017-12-05 DIAGNOSIS — K625 Hemorrhage of anus and rectum: Secondary | ICD-10-CM | POA: Diagnosis not present

## 2017-12-05 DIAGNOSIS — Z1211 Encounter for screening for malignant neoplasm of colon: Secondary | ICD-10-CM | POA: Diagnosis not present

## 2017-12-05 DIAGNOSIS — K760 Fatty (change of) liver, not elsewhere classified: Secondary | ICD-10-CM

## 2017-12-05 DIAGNOSIS — R7401 Elevation of levels of liver transaminase levels: Secondary | ICD-10-CM

## 2017-12-05 DIAGNOSIS — K648 Other hemorrhoids: Secondary | ICD-10-CM

## 2017-12-05 NOTE — Progress Notes (Signed)
HPI :  56 y/o male with history of CAD, CHF with ICD in place, atrial fibrillation on Xarelto, history of stroke, referred here by Karle Plumber MD for a new patient evaluation to discuss colonoscopy amongst other GI issues.   He reports he has had some hemorrhoids that have been bothering him over time.  He states occasionally they can prolapse, this can be make it difficult to clean himself, he also is bothered by irritation of bleeding at times.  He occasionally has hard stools and frequently takes a long time in the bathroom to have a bowel movement.  He has a bowel movement roughly every day to every other day.  He denies any chronic abdominal pains.  He denies any reflux symptoms much that bother him, no dysphasia which bothers him, though he does have some periodic coughing which when severe can lead to dry heaves.  He denies any shortness of breath or chest pain.  He is on Xarelto.  He denies any family history of colon cancer that he is aware of. He's never had a prior colonoscopy.  He did have some imaging in January when he presented acutely to the hospital in pain. He had an [place] on 05/20/17 which showed fatty liver and a dilated pancreatic duct, along with sludge in the GB. He had a subsequent CT scan on the same day showing a ? Nodular liver with enlarged caudate lobe, raising question of cirrhosis but "not definitive". Normal pancreas and spleen. He is not aware of any history of liver disease. Last ALT on file in January was 18 but he does have ALT upwards of 90s over the past few years. He denies any alcohol use. He takes amiodarone '200mg'$  daily.  Last Echo 10/27/2016 - EF 35%   Past Medical History:  Diagnosis Date  . Anxiety   . Arthritis   . Benzodiazepine dependence ([place])   . Bronchial asthma   . CAD (coronary artery disease)    a. Cath 03/2010: mod RCA stenosis, severe diag stenosis, treated medically given lack of angina.  . 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  Chronic systolic congestive heart failure (HCC)    Acute decompensation, LVEF less than 20%  . CKD (chronic kidney disease), stage IV (HCC)    Stage 3-4  . Depression   . History of cocaine abuse   . Hypertension   . Microcytic anemia   . PAF (paroxysmal atrial fibrillation) ([place])    a. Episode in 2008 with spontaneous conversion  . S/P implantation of automatic cardioverter/defibrillator (AICD)    a. Medtronic, implanted 2012.  . Sleep apnea   . Stroke [facility]) 2004  . Thyroid disease      Past Surgical History:  Procedure Laterality Date  . CARDIOVERSION N/A 01/07/2014   Procedure: CARDIOVERSION;  Surgeon: Lelon Perla, MD;  Location: [facility] ENDOSCOPY;  Service: Cardiovascular;  Laterality: N/A;  . CARDIOVERSION N/A 02/11/2014   Procedure: CARDIOVERSION;  Surgeon: Pixie Casino, MD;  Location: [facility] ENDOSCOPY;  Service: Cardiovascular;  Laterality: N/A;  . PACEMAKER INSERTION    . TEE WITHOUT CARDIOVERSION N/A 01/07/2014   Procedure: TRANSESOPHAGEAL ECHOCARDIOGRAM (TEE);  Surgeon: Lelon Perla, MD;  Location: [facility] ENDOSCOPY;  Service: Cardiovascular;  Laterality: N/A;  . TEE WITHOUT CARDIOVERSION N/A 02/11/2014   Procedure: TRANSESOPHAGEAL ECHOCARDIOGRAM (TEE);  Surgeon: Pixie Casino, MD;  Location: [facility] ENDOSCOPY;  Service: Cardiovascular;  Laterality: N/A;  . TONSILLECTOMY    . TRANSTHORACIC ECHOCARDIOGRAM  10/2006, 12/2009  Family History  Problem Relation Age of Onset  . Breast cancer Mother   . Multiple myeloma Mother   . Prostate cancer Father   . Diabetes Father   . Peripheral vascular disease Father   . Cerebral palsy Daughter   . Lung disease Neg Hx    Social History   Tobacco Use  . Smoking status: Former Smoker    Packs/day: 0.50    Years: 0.50    Pack years: 0.25    Types: Cigarettes    Last attempt to quit: 05/02/2010    Years since quitting: 7.6  . Smokeless tobacco: Never Used  . Tobacco comment: Significant Second-hand and only 3 months himself    Substance Use Topics  . Alcohol use: Yes    Alcohol/week: 0.0 oz    Comment: occ  . Drug use: No    Comment: Reported history of cocaine abuse off and on    Current Outpatient Medications  Medication Sig Dispense Refill  . albuterol (PROAIR HFA) 108 (90 Base) MCG/ACT inhaler Inhale 2 puffs into the lungs every 6 (six) hours as needed for wheezing or shortness of breath. 1 Inhaler 2  . alprazolam (XANAX) 2 MG tablet Take 2 mg by mouth every 6 (six) hours as needed for anxiety.     Marland Kitchen amiodarone (PACERONE) 200 MG tablet TAKE 1 TABLET(200 MG) BY MOUTH DAILY 30 tablet 5  . amLODipine (NORVASC) 10 MG tablet TAKE 1 TABLET BY MOUTH EVERY DAY 30 tablet 5  . aspirin 81 MG EC tablet Take 81 mg by mouth at bedtime.     . budesonide-formoterol (SYMBICORT) 160-4.5 MCG/ACT inhaler Inhale 2 puffs into the lungs 2 (two) times daily. 1 Inhaler 4  . carvedilol (COREG) 12.5 MG tablet Take 1 tablet (12.5 mg total) by mouth 2 (two) times daily with a meal. 180 tablet 3  . DULoxetine (CYMBALTA) 60 MG capsule Take 60 mg by mouth daily.    . ferrous sulfate 325 (65 FE) MG tablet Take 325 mg by mouth daily with breakfast.     . hydrALAZINE (APRESOLINE) 25 MG tablet TAKE 1 TABLET(25 MG) BY MOUTH THREE TIMES DAILY 270 tablet 1  . mometasone (NASONEX) 50 MCG/ACT nasal spray Place 2 sprays into the nose daily. 17 g 5  . potassium chloride 20 MEQ/15ML (10%) SOLN Take 30 mLs (40 mEq total) by mouth daily. 900 mL 6  . pravastatin (PRAVACHOL) 40 MG tablet TAKE 1 TABLET BY MOUTH EVERY NIGHT AT BEDTIME 90 tablet 2  . sildenafil (REVATIO) 20 MG tablet TAKE 2-5 TABLETS BY MOUTH DAILY AS NEEDED PRIOR TO SEXUAL ACTIVITY 180 tablet 3  . Spacer/Aero-Holding Chambers (AEROCHAMBER MV) inhaler Use as instructed 1 each 0  . XARELTO 20 MG TABS tablet TAKE 1 TABLET BY MOUTH EVERY DAY WITH DINNER 30 tablet 8   No current facility-administered medications for this visit.    Allergies  Allergen Reactions  . Codeine Itching, Nausea And  Vomiting and Hives  . Hydrocodone-Acetaminophen Itching and Nausea And Vomiting     Review of Systems: All systems reviewed and negative except where noted in HPI.   Lab Results  Component Value Date   WBC 8.8 05/30/2017   HGB 16.1 05/30/2017   HCT 46.5 05/30/2017   MCV 91 05/30/2017   PLT 277 05/30/2017    Lab Results  Component Value Date   CREATININE 1.81 (H) 05/20/2017   BUN 20 05/20/2017   NA 136 05/20/2017   K 4.0 05/20/2017   CL  103 05/20/2017   CO2 23 05/20/2017    Lab Results  Component Value Date   ALT 40 05/20/2017   AST 34 05/20/2017   ALKPHOS 75 05/20/2017   BILITOT 1.1 05/20/2017    Lab Results  Component Value Date   INR 1.14 12/25/2013   INR 1.0 11/29/2010   INR 1.0 ratio 03/15/2010     Physical Exam: BP 124/74 (BP Location: Left Arm, Patient Position: Sitting, Cuff Size: Normal)   Pulse 60   Ht 6' 1.5" (1.867 m) Comment: height measured without shoes  Wt 242 lb 6 oz (109.9 kg)   BMI 31.54 kg/m  Constitutional: Pleasant,well-developed, male in no acute distress. HEENT: Normocephalic and atraumatic. Conjunctivae are normal. No scleral icterus. Neck supple.  Cardiovascular: Normal rate, regular rhythm.  Pulmonary/chest: Effort normal and breath sounds normal. No wheezing, rales or rhonchi. Abdominal: Soft, nondistended, nontender. There are no masses palpable.  Extremities: no edema Lymphadenopathy: No cervical adenopathy noted. Neurological: Alert and oriented to person place and time. Skin: Skin is warm and dry. No rashes noted. Psychiatric: Normal mood and affect. Behavior is normal.   ASSESSMENT AND PLAN: 57 year old male with significant cardiovascular disease as outlined above, here for new patient visit to discuss the following issues:  Rectal bleeding / internal hemorrhoids / colon cancer screening - I suspect his chronic intermittent bleeding and perianal irritation is due to hemorrhoids as he describes his symptoms, however has  never had a colonoscopy before.  We discussed colon cancer screening in general in light of his symptoms and colonoscopy is recommended.  Following discussion of risks and benefits he want to proceed.  His ejection fraction is currently 35%, this was last evaluated a few years ago.  As it currently stands with that ejection fraction his colonoscopy must be done at the hospital for anesthesia support.  He is due to see his cardiologist in a few weeks.  If they repeat his echocardiogram and EF is greater than 35% his procedure can be done at the [facility].  We will reserve him a spot at the hospital to be done October if that is when he wants to do it, and if echo is done before hand and okay will perform it sooner.  He will need to hold his Xarelto for 3 days given his current GFR in 40s.  We will reach out to his cardiologist to obtain approval. He will try a daily fiber supplement in the interim to keep stools soft and minimize time on the toilet. He agreed  Elevated ALT / fatty liver - CT raises the question of cirrhosis, however this is not seen on ultrasound, his spleen is normal, his platelets are normal.  I am not convinced he has cirrhosis based on findings today.  His chronic ALT elevation may likely be due to fatty liver, last ALT was only 40, we will repeat it at this time, we will also screen him for viral hepatitis, immunized him to Hep A and B if needed, will also screen him for hemochromatosis to ensure negative.  He is on amiodarone and will need to monitor LFTs, will repeat them today. We may consider interval imaging of his liver in the next 6 to 12 months to ensure no evidence of cirrhosis.  He is already minimizing alcohol intake which he should continue to do, and work on weight loss for fatty liver. He agreed with the plan  Huson Cellar, MD [facility]  CC: Ladell Pier, MD

## 2017-12-05 NOTE — Progress Notes (Signed)
error 

## 2017-12-05 NOTE — Patient Instructions (Addendum)
If you are age 55 or older, your body mass index should be between 23-30. Your Body mass index is 31.54 kg/m. If this is out of the aforementioned range listed, please consider follow up with your Primary Care Provider.  If you are age 22 or younger, your body mass index should be between 19-25. Your Body mass index is 31.54 kg/m. If this is out of the aformentioned range listed, please consider follow up with your Primary Care Provider.   We have entered labs for you to have done at the lab of your choice within the [facility] network.  Please purchase an over the counter fiber supplement and take daily.  We will call you to schedule your colonoscopy when Dr. Doyne Keel [facility] schedule opens.  Thank you for entrusting me with your care and for choosing [facility], Dr. Grand Detour Cellar

## 2017-12-06 ENCOUNTER — Other Ambulatory Visit: Payer: Medicaid Other | Admitting: *Deleted

## 2017-12-06 LAB — HEPATIC FUNCTION PANEL
ALBUMIN: 3.1 g/dL — AB (ref 3.5–5.0)
ALT: 99 U/L — AB (ref 0–44)
AST: 90 U/L — ABNORMAL HIGH (ref 15–41)
Alkaline Phosphatase: 103 U/L (ref 38–126)
BILIRUBIN INDIRECT: 0.9 mg/dL (ref 0.3–0.9)
Bilirubin, Direct: 0.1 mg/dL (ref 0.0–0.2)
TOTAL PROTEIN: 7 g/dL (ref 6.5–8.1)
Total Bilirubin: 1 mg/dL (ref 0.3–1.2)

## 2017-12-06 LAB — FERRITIN: Ferritin: 343 ng/mL — ABNORMAL HIGH (ref 24–336)

## 2017-12-06 LAB — IRON AND TIBC
Iron: 94 ug/dL (ref 45–182)
Saturation Ratios: 30 % (ref 17.9–39.5)
TIBC: 318 ug/dL (ref 250–450)
UIBC: 224 ug/dL

## 2017-12-07 ENCOUNTER — Other Ambulatory Visit: Payer: Self-pay

## 2017-12-07 DIAGNOSIS — F410 Panic disorder [episodic paroxysmal anxiety] without agoraphobia: Secondary | ICD-10-CM | POA: Diagnosis not present

## 2017-12-07 DIAGNOSIS — F331 Major depressive disorder, recurrent, moderate: Secondary | ICD-10-CM | POA: Diagnosis not present

## 2017-12-07 LAB — HEPATITIS C ANTIBODY: HCV Ab: <0.1 s/co ratio (ref 0.0–0.9)

## 2017-12-07 LAB — HEPATITIS B SURFACE ANTIGEN: Hepatitis B Surface Ag: POSITIVE — AB

## 2017-12-07 LAB — HEPATITIS A ANTIBODY, IGM: Hep A IgM: NEGATIVE

## 2017-12-07 LAB — HEPATITIS B SURFACE ANTIBODY, QUANTITATIVE: HEPATITIS B-POST: 226.2 m[IU]/mL

## 2017-12-07 NOTE — Progress Notes (Unsigned)
ERROR

## 2017-12-08 ENCOUNTER — Other Ambulatory Visit: Payer: Medicaid Other | Admitting: *Deleted

## 2017-12-08 DIAGNOSIS — Z1211 Encounter for screening for malignant neoplasm of colon: Secondary | ICD-10-CM

## 2017-12-08 DIAGNOSIS — K648 Other hemorrhoids: Secondary | ICD-10-CM

## 2017-12-08 DIAGNOSIS — K625 Hemorrhage of anus and rectum: Secondary | ICD-10-CM

## 2017-12-08 DIAGNOSIS — K760 Fatty (change of) liver, not elsewhere classified: Secondary | ICD-10-CM

## 2017-12-09 LAB — HEPATITIS B SURFACE ANTIBODY,QUALITATIVE: Hep B S Ab: REACTIVE

## 2017-12-09 LAB — HEPATITIS A ANTIBODY, TOTAL: HEP A TOTAL AB: NEGATIVE

## 2017-12-10 ENCOUNTER — Other Ambulatory Visit: Payer: Self-pay | Admitting: Cardiovascular Disease

## 2017-12-11 NOTE — Telephone Encounter (Signed)
Pt is a 57 yr old male who saw Dr. Lovena Le on 10/03/17. Last noted weight was 109.9Kg. Last noted SCr was 1.81 on 05/20/17. CrCl is 35mL/min. Will refill Xarelto 20mg  QD.

## 2017-12-12 ENCOUNTER — Telehealth: Payer: Self-pay | Admitting: Gastroenterology

## 2017-12-12 ENCOUNTER — Other Ambulatory Visit: Payer: Self-pay

## 2017-12-12 DIAGNOSIS — R768 Other specified abnormal immunological findings in serum: Secondary | ICD-10-CM

## 2017-12-12 NOTE — Telephone Encounter (Signed)
Patient given results and recommendations today, obviously he has a lot of questions and anxious. He will get his labs done tomorrow so hopefully this will give more answers. When these results come back, please contact him. Thank you.

## 2017-12-13 ENCOUNTER — Telehealth: Payer: Self-pay

## 2017-12-13 ENCOUNTER — Other Ambulatory Visit: Payer: Medicaid Other | Admitting: *Deleted

## 2017-12-13 ENCOUNTER — Other Ambulatory Visit: Payer: Self-pay

## 2017-12-13 DIAGNOSIS — R768 Other specified abnormal immunological findings in serum: Secondary | ICD-10-CM | POA: Diagnosis not present

## 2017-12-13 DIAGNOSIS — Z1211 Encounter for screening for malignant neoplasm of colon: Secondary | ICD-10-CM

## 2017-12-13 DIAGNOSIS — K625 Hemorrhage of anus and rectum: Secondary | ICD-10-CM

## 2017-12-13 NOTE — Addendum Note (Signed)
Addended by: Wyatt Haste on: 12/13/2017 03:12 PM   Modules accepted: Orders

## 2017-12-13 NOTE — Telephone Encounter (Signed)
-----   Message from Roetta Sessions, [place] sent at 12/05/2017  3:17 PM EDT ----- Regarding: schedule at hospital in October Schedule pt for colon at hospital when October schedule opens.  Pt going to cardiologist next month.  If EF% raises above 35% can go to the  [place].

## 2017-12-13 NOTE — Telephone Encounter (Signed)
[facility] Pre-operative Risk Assessment     Request for surgical clearance:     Endoscopy Procedure  What type of surgery is being performed?     colonoscopy  When is this surgery scheduled?     02-06-18  What type of clearance is required ?   Pharmacy  Are there any medications that need to be held prior to surgery and how long? Xarelto 3 days  Practice name and name of physician performing surgery?      [facility]  What is your office phone and fax number?      Phone- [phone]  Fax- [phone]. Please route response to Lemar Lofty, CMA  Anesthesia type (None, local, MAC, general) ?       MAC

## 2017-12-13 NOTE — Telephone Encounter (Signed)
Dr. Havery Ortega, please be advised that Cody Ortega has been cleared by [facility] to hold Xarelto for only 1 day prior to procedure. We have requested 3 days. He has an office visit with Dr. Burt Knack on 9-12.  His colonoscopy procedure has been scheduled at the hospital on Tues, 02-06-18.

## 2017-12-13 NOTE — Telephone Encounter (Signed)
   Primary Cardiologist: [name], MD  Chart reviewed as part of pre-operative protocol coverage. Given past medical history and time since last visit, based on ACC/AHA guidelines, Cody Ortega would be at acceptable risk for the planned procedure without further cardiovascular testing.    Hold Xarelto 24 hours pre op.   I will route this recommendation to the requesting party via Epic fax function and remove from pre-op pool.  Please call with questions.  Kerin Ransom, PA-C 12/13/2017, 4:38 PM

## 2017-12-13 NOTE — Telephone Encounter (Signed)
Thanks Almyra Free. Once I have his follow up labs done I will contact him to discuss results. The follow up labs may take 1-2 weeks to return

## 2017-12-13 NOTE — Telephone Encounter (Signed)
Pt takes Xarelto for afib with CHADS2VASc score of 5 (HTN, CHF, CAD, stroke). CrCl is 9mL/min. Recommend holding Xarelto for 1 day prior to procedure due to history of afib with stroke.

## 2017-12-13 NOTE — Telephone Encounter (Signed)
Called pt. Scheduled for colon at [facility] on 02-06-18 at 8:30am, arrive at 7:00am.  Previsit scheduled for Wednesday, 12-27-17 at 9:00am. Pt expressed understanding.

## 2017-12-13 NOTE — Progress Notes (Signed)
Orders for colon at [facility] on 02-06-18, Dr. Havery Moros

## 2017-12-14 ENCOUNTER — Telehealth: Payer: Self-pay

## 2017-12-14 NOTE — Telephone Encounter (Signed)
-----   Message from Leeroy Bock, [facility] sent at 12/13/2017  7:25 AM EDT ----- Regarding: RE: Medication review All statins are contraindicated in patients with active liver disease. Will probably need to d/c statin until his hepatitis is treated, then would make the change to atorvastatin after.  Thanks, Jinny Blossom  ----- Message ----- From: Theodoro Parma, RN Sent: 12/12/2017   5:20 PM EDT To: Leeroy Bock, RPH Subject: FW: Medication review                          Since this medication change recommendation, the patient was diagnosed with hep B. Does this make a difference? Should we make a change now? ----- Message ----- From: [name], MD Sent: 12/04/2017  10:34 AM EDT To: Memory Argue, PharmD, Theodoro Parma, RN Subject: RE: Medication review                          Thanks for the information will make the change ----- Message ----- From: Memory Argue, PharmD Sent: 12/04/2017   9:58 AM To: [name], MD Subject: Medication review                              Hello Dr. Burt Knack,  I am a clinical pharmacist with [facility]) and was requested to complete a medication review on this patient.  I hope you will find the following information useful.  1.  Consider changing from pravastatin 40mg  to atorvastatin 20mg  based on patient's diagnosis of stage IV CKD and his last GFR.  Atovastatin is the only statin that does not require a dose adjustment in CKD since it undergoes less than 1% renal elimination.  Thank you  Memory Argue, PharmD, CPP, CLS

## 2017-12-14 NOTE — Telephone Encounter (Signed)
Instructed patient to STOP PRAVASTATIN. He understands he will most likely start Atorvastatin 20 mg when OK by GI.   To GI for medication instructions.

## 2017-12-15 LAB — HBV REAL-TIME PCR, QUANT
HBV AS IU/ML: 56400000 IU/mL
LOG10 HBV AS IU/ML: 7.751 {Log_IU}/mL

## 2017-12-15 LAB — HEPATITIS B DNA, ULTRAQUANTITATIVE, PCR

## 2017-12-15 LAB — HEPATITIS B E ANTIBODY: Hep B E Ab: NEGATIVE

## 2017-12-15 LAB — HEPATITIS B E ANTIGEN: Hep B E Ag: POSITIVE — AB

## 2017-12-18 NOTE — Telephone Encounter (Signed)
Thanks Jan. Timing of how long to hold his Xarelto is based on his kidney function. If his GFR is in the 70s as reported he will need to hold the Xarelto at least 2 DAYS based on ASGE guidelines to minimize bleeding risk. The last GFR I can see in Epic is in the 40s, would would be a 3 DAY hold. If his primary has labs that show GFR in the 70s most recently than we should have a copy to clarify, and it would be a 2 day hold. Thanks

## 2017-12-21 ENCOUNTER — Telehealth: Payer: Self-pay

## 2017-12-21 DIAGNOSIS — B181 Chronic viral hepatitis B without delta-agent: Secondary | ICD-10-CM

## 2017-12-21 NOTE — Telephone Encounter (Signed)
Lab orders printed for pt to pick up.

## 2017-12-21 NOTE — Telephone Encounter (Signed)
-----   Message from Yetta Flock, MD sent at 12/21/2017 12:59 PM EDT ----- Called patient. 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 He denies any blood transfusion, sharing of needles / drug use, has been in a monogamous relationship. We discussed hepatitis B at length. 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 Based on this profile it would be recommended that he receive therapy. Prior to doing this, will need to recheck his renal function to assess GFR which will influence dosing of entecavir or tenofovir. Will also screen for HIV, hepatitis D, AFP, and repeat LFTs. Once I have this back I will contact him and likely start entecavir. He will otherwise communicate this to his partner and have her tested and vaccinated if she is negative. Recommend using barrier protection for intercourse until this is sorted out. He agreed and we will be in touch with him once results are back.   Jan can you help order the following labs at his cardiologist's lab where he prefers to have the labs done - CMET, HIV AB 1/2, hepatitis D, AFP.   Thanks

## 2017-12-22 ENCOUNTER — Telehealth: Payer: Self-pay

## 2017-12-22 NOTE — Telephone Encounter (Signed)
Called and LM for pt to come pick up printed lab orders.  Also Previsit moved to 9-23 at 11:00am.  Will try to schedule Hep A vaccine for the same day.

## 2017-12-22 NOTE — Telephone Encounter (Signed)
-----   Message from Yetta Flock, MD sent at 12/21/2017 12:59 PM EDT ----- Called patient. 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 He denies any blood transfusion, sharing of needles / drug use, has been in a monogamous relationship. We discussed hepatitis B at length. 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 Based on this profile it would be recommended that he receive therapy. Prior to doing this, will need to recheck his renal function to assess GFR which will influence dosing of entecavir or tenofovir. Will also screen for HIV, hepatitis D, AFP, and repeat LFTs. Once I have this back I will contact him and likely start entecavir. He will otherwise communicate this to his partner and have her tested and vaccinated if she is negative. Recommend using barrier protection for intercourse until this is sorted out. He agreed and we will be in touch with him once results are back.   Jan can you help order the following labs at his cardiologist's lab where he prefers to have the labs done - CMET, HIV AB 1/2, hepatitis D, AFP.   Thanks

## 2017-12-25 ENCOUNTER — Other Ambulatory Visit: Payer: Self-pay

## 2017-12-26 ENCOUNTER — Telehealth: Payer: Self-pay | Admitting: Gastroenterology

## 2017-12-26 NOTE — Telephone Encounter (Signed)
Pt called to let me know he is out of the office this week.  Got my message about Labs, previsit and Hep A inj on same day as previsit

## 2017-12-26 NOTE — Telephone Encounter (Signed)
Called and Left another message for pt to pick up lab orders to take to Cardiology office.  I can fax if he can ask them for their fax number.

## 2017-12-28 LAB — COMPREHENSIVE METABOLIC PANEL
ALT: 94 U/L — AB (ref 0–44)
AST: 84 U/L — AB (ref 15–41)
Albumin: 3 g/dL — ABNORMAL LOW (ref 3.5–5.0)
Alkaline Phosphatase: 94 U/L (ref 38–126)
Anion gap: 6 (ref 5–15)
BILIRUBIN TOTAL: 0.9 mg/dL (ref 0.3–1.2)
BUN: 19 mg/dL (ref 6–20)
CO2: 28 mmol/L (ref 22–32)
CREATININE: 1.77 mg/dL — AB (ref 0.61–1.24)
Calcium: 8.8 mg/dL — ABNORMAL LOW (ref 8.9–10.3)
Chloride: 104 mmol/L (ref 98–111)
GFR calc Af Amer: 48 mL/min — ABNORMAL LOW (ref >60)
GFR, EST NON AFRICAN AMERICAN: 42 mL/min — AB (ref >60)
Glucose, Bld: 96 mg/dL (ref 70–99)
POTASSIUM: 3.8 mmol/L (ref 3.5–5.1)
Sodium: 138 mmol/L (ref 135–145)
TOTAL PROTEIN: 6.6 g/dL (ref 6.5–8.1)

## 2017-12-28 NOTE — Telephone Encounter (Signed)
Patient is calling back regarding this. He has questions about lab orders. Best # [phone]

## 2017-12-29 LAB — AFP TUMOR MARKER: AFP, SERUM, TUMOR MARKER: 1.9 ng/mL (ref 0.0–8.3)

## 2017-12-29 LAB — HIV ANTIBODY (ROUTINE TESTING W REFLEX): HIV Screen 4th Generation wRfx: NONREACTIVE

## 2018-01-02 NOTE — Telephone Encounter (Signed)
Pt picked up lab orders. Labs completed.

## 2018-01-02 NOTE — Telephone Encounter (Signed)
Thank you Jan, he will need to hold Xarelto for 3 days based on GFR

## 2018-01-03 ENCOUNTER — Telehealth: Payer: Self-pay | Admitting: Gastroenterology

## 2018-01-03 NOTE — Telephone Encounter (Signed)
Routed to Jan. 

## 2018-01-04 LAB — MISC LABCORP TEST (SEND OUT): LABCORP TEST CODE: 9985

## 2018-01-04 NOTE — Telephone Encounter (Signed)
I called the patient. He has a new hep B infection, DNA > 20K, ALT > 2 x's ULN, hep B E AG (+), E AG (-). HIV negative, hep C negative, awaiting hep D labs which is pending  I think he will likely warrant treatment for immune active hep B, however guidelines recommend that treatment be delayed for three to six months in newly diagnosed HBeAg-positive patients with compensated liver disease to determine whether spontaneous HBeAg seroconversion will occur. I discussed this issue with him.   We will plan on repeating Hep B DNA, hep B E AG, LFTs to be done in first week of November, as well as clinic visit as at that time to discuss the issue. If his AG remains positive and ALT and viral load remain high, will plan on treatment at that time. He agreed with the plan.   Jan, can you put in a recall for Hep B DNA, hep B E AG, LFTs to be done in first week of November as well as schedule a clinic appointment at that time. Of note, he also asked to be notified a week or 2 in advance of his colonoscopy in October as reminder. Thanks

## 2018-01-10 ENCOUNTER — Ambulatory Visit (INDEPENDENT_AMBULATORY_CARE_PROVIDER_SITE_OTHER): Payer: Medicaid Other | Admitting: *Deleted

## 2018-01-10 DIAGNOSIS — I255 Ischemic cardiomyopathy: Secondary | ICD-10-CM | POA: Diagnosis not present

## 2018-01-10 LAB — CUP PACEART INCLINIC DEVICE CHECK
Battery Voltage: 2.82 V
Brady Statistic RV Percent Paced: 0.27 %
Date Time Interrogation Session: 20190911162921
HIGH POWER IMPEDANCE MEASURED VALUE: 342 Ohm
HIGH POWER IMPEDANCE MEASURED VALUE: 52 Ohm
HIGH POWER IMPEDANCE MEASURED VALUE: 71 Ohm
Lead Channel Impedance Value: 342 Ohm
Lead Channel Sensing Intrinsic Amplitude: 6.25 mV
Lead Channel Sensing Intrinsic Amplitude: 9.75 mV
Lead Channel Setting Pacing Amplitude: 2.5 V
Lead Channel Setting Pacing Pulse Width: 0.4 ms
MDC IDC LEAD IMPLANT DT: 20120806
MDC IDC LEAD LOCATION: 753860
MDC IDC LEAD SERIAL: 345870
MDC IDC MSMT LEADCHNL RV PACING THRESHOLD AMPLITUDE: 1 V
MDC IDC MSMT LEADCHNL RV PACING THRESHOLD PULSEWIDTH: 0.4 ms
MDC IDC PG IMPLANT DT: 20120806
MDC IDC SET LEADCHNL RV SENSING SENSITIVITY: 0.3 mV

## 2018-01-10 NOTE — Progress Notes (Signed)
ICD check in clinic. Normal device function. Threshold and sensing consistent with previous device measurements. Impedance trends stable over time. No evidence of any ventricular arrhythmias. Histogram distribution appropriate for patient and level of activity. No changes made this session. Device programmed at appropriate safety margins. Device programmed to optimize intrinsic conduction. Estimated longevity 14-17 months. ROV w/ DC 04/11/2018. Alert tones demonstrated

## 2018-01-11 ENCOUNTER — Ambulatory Visit: Payer: Medicaid Other | Admitting: Cardiovascular Disease

## 2018-01-11 ENCOUNTER — Encounter: Payer: Self-pay | Admitting: Cardiovascular Disease

## 2018-01-11 VITALS — BP 110/62 | HR 64 | Ht 73.5 in | Wt 240.2 lb

## 2018-01-11 DIAGNOSIS — I10 Essential (primary) hypertension: Secondary | ICD-10-CM | POA: Diagnosis not present

## 2018-01-11 DIAGNOSIS — I5022 Chronic systolic (congestive) heart failure: Secondary | ICD-10-CM

## 2018-01-11 DIAGNOSIS — I480 Paroxysmal atrial fibrillation: Secondary | ICD-10-CM

## 2018-01-11 DIAGNOSIS — I2510 Atherosclerotic heart disease of native coronary artery without angina pectoris: Secondary | ICD-10-CM

## 2018-01-11 DIAGNOSIS — E782 Mixed hyperlipidemia: Secondary | ICD-10-CM

## 2018-01-11 DIAGNOSIS — R748 Abnormal levels of other serum enzymes: Secondary | ICD-10-CM

## 2018-01-11 MED ORDER — RIVAROXABAN 15 MG PO TABS: 15.0000 mg | ORAL_TABLET | Freq: Every day | ORAL | 3 refills | Status: DC

## 2018-01-11 NOTE — Progress Notes (Signed)
Cardiology Office Note:    Date:  01/11/2018   ID:  Cody Ortega, DOB Feb 20, 1961, MRN [id number]  PCP:  Ladell Pier, MD  Cardiologist:  [name], MD  Electrophysiologist:  None   Referring MD: Ladell Pier, MD   Chief Complaint  Patient presents with  . Shortness of Breath    History of Present Illness:    Cody Ortega is a 57 y.o. male with a hx of persistent atrial fibrillation, chronic systolic heart failure with nonischemic cardiomyopathy, coronary artery disease, malignant hypertension, stage III chronic kidney disease.  He is here today for follow-up evaluation.  Since his last visit, he was diagnosed with hepatitis B.  He is being treated by Dr. Havery Moros.  The patient has lost 50 pounds intentionally since his last visit.  He is much more active in the summertime.  He is feeling better, but he does experience symptoms of weakness and lightheadedness that he attributes to low blood pressure.  He continues on his antihypertensive regimen without any changes.  He denies chest pain or pressure.  He has stable shortness of breath with physical exertion.  He denies orthopnea, PND, or leg swelling.  Past Medical History:  Diagnosis Date  . Anxiety   . Arthritis   . Benzodiazepine dependence ([place])   . Bronchial asthma   . CAD (coronary artery disease)    a. Cath 03/2010: mod RCA stenosis, severe diag stenosis, treated medically given lack of angina.  . 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 Chronic systolic congestive heart failure (HCC)    Acute decompensation, LVEF less than 20%  . CKD (chronic kidney disease), stage IV (HCC)    Stage 3-4  . Depression   . History of cocaine abuse   . Hypertension   . Microcytic anemia   . PAF (paroxysmal atrial fibrillation) ([place])    a. Episode in 2008 with spontaneous conversion  . S/P implantation of automatic cardioverter/defibrillator (AICD)    a. Medtronic, implanted 2012.  . Sleep apnea   . Stroke  [facility]) 2004  . Thyroid disease     Past Surgical History:  Procedure Laterality Date  . CARDIOVERSION N/A 01/07/2014   Procedure: CARDIOVERSION;  Surgeon: Lelon Perla, MD;  Location[facility] ENDOSCOPY;  Service: Cardiovascular;  Laterality: N/A;  . CARDIOVERSION N/A 02/11/2014   Procedure: CARDIOVERSION;  Surgeon: Pixie Casino, MD;  Location: [facility] ENDOSCOPY;  Service: Cardiovascular;  Laterality: N/A;  . PACEMAKER INSERTION    . TEE WITHOUT CARDIOVERSION N/A 01/07/2014   Procedure: TRANSESOPHAGEAL ECHOCARDIOGRAM (TEE);  Surgeon: Lelon Perla, MD;  Location: [facility] ENDOSCOPY;  Service: Cardiovascular;  Laterality: N/A;  . TEE WITHOUT CARDIOVERSION N/A 02/11/2014   Procedure: TRANSESOPHAGEAL ECHOCARDIOGRAM (TEE);  Surgeon: Pixie Casino, MD;  Location: [facility] ENDOSCOPY;  Service: Cardiovascular;  Laterality: N/A;  . TONSILLECTOMY    . TRANSTHORACIC ECHOCARDIOGRAM  10/2006, 12/2009    Current Medications: Current Meds  Medication Sig  . albuterol (PROAIR HFA) 108 (90 Base) MCG/ACT inhaler Inhale 2 puffs into the lungs every 6 (six) hours as needed for wheezing or shortness of breath.  . alprazolam (XANAX) 2 MG tablet Take 2 mg by mouth every 6 (six) hours as needed for anxiety.   Marland Kitchen amiodarone (PACERONE) 200 MG tablet TAKE 1 TABLET(200 MG) BY MOUTH DAILY  . amLODipine (NORVASC) 10 MG tablet TAKE 1 TABLET BY MOUTH EVERY DAY  . aspirin 81 MG EC tablet Take 81 mg by mouth at bedtime.   Marland Kitchen  budesonide-formoterol (SYMBICORT) 160-4.5 MCG/ACT inhaler Inhale 2 puffs into the lungs 2 (two) times daily.  . carvedilol (COREG) 12.5 MG tablet Take 1 tablet (12.5 mg total) by mouth 2 (two) times daily with a meal.  . DULoxetine (CYMBALTA) 60 MG capsule Take 60 mg by mouth daily.  . ferrous sulfate 325 (65 FE) MG tablet Take 325 mg by mouth daily with breakfast.   . mometasone (NASONEX) 50 MCG/ACT nasal spray Place 2 sprays into the nose daily.  . potassium chloride 20 MEQ/15ML (10%) SOLN Take 30 mLs (40 mEq  total) by mouth daily.  . Rivaroxaban (XARELTO) 15 MG TABS tablet Take 1 tablet (15 mg total) by mouth daily with supper.  . sildenafil (REVATIO) 20 MG tablet TAKE 2-5 TABLETS BY MOUTH DAILY AS NEEDED PRIOR TO SEXUAL ACTIVITY  . Spacer/Aero-Holding Chambers (AEROCHAMBER MV) inhaler Use as instructed  . [DISCONTINUED] hydrALAZINE (APRESOLINE) 25 MG tablet TAKE 1 TABLET(25 MG) BY MOUTH THREE TIMES DAILY  . [DISCONTINUED] XARELTO 20 MG TABS tablet TAKE 1 TABLET BY MOUTH EVERY DAY WITH DINNER     Allergies:   Codeine and Hydrocodone-acetaminophen   Social History   Socioeconomic History  . Marital status: Divorced    Spouse name: Not on file  . Number of children: 2  . Years of education: Not on file  . Highest education level: Not on file  Occupational History  . 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 Financial resource strain: Not on file  . Food insecurity:    Worry: Not on file    Inability: Not on file  . Transportation needs:    Medical: Not on file    Non-medical: Not on file  Tobacco Use  . Smoking status: Former Smoker    Packs/day: 0.50    Years: 0.50    Pack years: 0.25    Types: Cigarettes    Last attempt to quit: 05/02/2010    Years since quitting: 7.7  . Smokeless tobacco: Never Used  . Tobacco comment: Significant Second-hand and only 3 months himself  Substance and Sexual Activity  . Alcohol use: Yes    Alcohol/week: 0.0 standard drinks    Comment: occ  . Drug use: No    Comment: Reported history of cocaine abuse off and on   . Sexual activity: Not on file  Lifestyle  . Physical activity:    Days per week: Not on file    Minutes per session: Not on file  . Stress: Not on file  Relationships  . Social connections:    Talks on phone: Not on file    Gets together: Not on file    Attends religious service: Not on file    Active member of club or organization: Not on file    Attends  meetings of clubs or organizations: Not on file    Relationship status: Not on file  Other Topics Concern  . Not on file  Social History Narrative   Living with a son who is a 37 year old.  He is not     working, unemployed for 3 years.  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  Denied use of alcohol.  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        Pulmonary:   Originally from [place], [place]. He grew up in [place]. He moved to [place] in  1985. 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 No pets currently. No bird exposure. He did have mold in a previous home. No hot tub exposure.           Family History: The patient's family history includes Breast cancer in his mother; Cerebral palsy in his daughter; Diabetes in his father; Multiple myeloma in his mother; Peripheral vascular disease in his father; Prostate cancer in his father. There is no history of Lung disease.  ROS:   Please see the history of present illness.    Positive for visual disturbance, back pain, snoring, wheezing, constipation, anxiety, joint swelling, balance problems.  All other systems reviewed and are negative.  EKGs/Labs/Other Studies Reviewed:    The following studies were reviewed today: Echo 10/28/2015: Study Conclusions  - Left ventricle: The cavity size was normal. Wall thickness was   increased in a pattern of moderate LVH. The estimated ejection   fraction was 35%. Diffuse hypokinesis. Doppler parameters are   consistent with abnormal left ventricular relaxation (grade 1   diastolic dysfunction). - Aortic valve: There was no stenosis. - Aorta: Mildly dilated aortic root. Aortic root dimension: 38 mm   (ED). - Mitral valve: Mildly calcified annulus. Mildly calcified leaflets   . There was no significant regurgitation. - Left atrium: The atrium was moderately dilated. - Right ventricle: Poorly visualized. The cavity size was  normal.   Pacer wire or catheter noted in right ventricle. Systolic   function was normal. - Right atrium: The atrium was mildly dilated. - Tricuspid valve: Peak RV-RA gradient (S): 23 mm Hg. - Pulmonary arteries: PA peak pressure: 38 mm Hg (S). - Systemic veins: IVC measured 2.2 cm with < 50% respirophasic   variation, suggesting RA pressure 15 mmHg.  Impressions:  - Technically difficult study with poor acoustic windows. Echo   contrast may have helped but was not used. Normal LV size with   moderate LV hypertrophy. Diffuse hypokinesis, EF 35%. Normal RV   size and systolic function (poorly visualized).  EKG:  EKG is not ordered today.   Recent Labs: 05/16/2017: TSH 1.760 05/30/2017: Hemoglobin 16.1; Platelets 277 12/28/2017: ALT 94; BUN 19; Creatinine, Ser 1.77; Potassium 3.8; Sodium 138  Recent Lipid Panel    Component Value Date/Time   CHOL 165 05/16/2017 1353   TRIG 100 05/16/2017 1353   HDL 48 05/16/2017 1353   CHOLHDL 3.4 05/16/2017 1353   CHOLHDL 3.9 10/12/2015 1026   VLDL 27 10/12/2015 1026   LDLCALC 97 05/16/2017 1353    Physical Exam:    VS:  BP 110/62   Pulse 64   Ht 6' 1.5" (1.867 m)   Wt 240 lb 3.2 oz (109 kg)   SpO2 97%   BMI 31.26 kg/m     Wt Readings from Last 3 Encounters:  01/11/18 240 lb 3.2 oz (109 kg)  12/05/17 242 lb 6 oz (109.9 kg)  10/10/17 260 lb (117.9 kg)     GEN:  Well nourished, well developed in no acute distress HEENT: Normal NECK: No JVD; No carotid bruits LYMPHATICS: No lymphadenopathy CARDIAC: RRR, no murmurs, rubs, gallops RESPIRATORY:  Clear to auscultation without rales, wheezing or rhonchi  ABDOMEN: Soft, non-tender, non-distended MUSCULOSKELETAL:  No edema; No deformity  SKIN: Warm and dry NEUROLOGIC:  Alert and oriented x 3 PSYCHIATRIC:  Normal affect   ASSESSMENT:    1. PAF (paroxysmal atrial fibrillation) ([place])   2. Chronic systolic heart failure ([place])  3. Elevated liver enzymes   4. Mixed hyperlipidemia     5. Coronary artery disease involving native coronary artery of native heart without angina pectoris   6. Essential hypertension, malignant    PLAN:    In order of problems listed above:  1. The patient is maintaining sinus rhythm on amiodarone.  He was quite sick when he had atrial fibrillation, and I think it is best to continue him on amiodarone at this point.  He is anticoagulated with rivaroxaban.  Reviewed his renal function and asked him to reduce the dose to 15 mg daily.  Also reviewed notes from GI and we have recommended that he hold rivaroxaban 3 days prior to colonoscopy.  Will need to be cautious with amiodarone in the setting of his elevated LFTs. 2. The patient is stable with respect to his heart failure.  Weight loss has really helped him.  He is having problems with low blood pressure and I advised him to stop hydralazine today.  He will continue on amlodipine, carvedilol.  He has not been able to tolerate an ACE or ARB because of worsening renal function when he has been on these in the past. 3. He will hold atorvastatin until we have follow-up LFTs 4. Currently holding atorvastatin 5. The patient is stable with respect to his coronary artery disease.  His cardiomyopathy is far out of proportion to the extent of CAD.  He is having no anginal symptoms. 6. Blood pressure is currently overtreated, likely because of 50 pound weight loss.  Hydralazine is discontinued today.    Medication Adjustments/Labs and Tests Ordered: Current medicines are reviewed at length with the patient today.  Concerns regarding medicines are outlined above.  Orders Placed This Encounter  Procedures  . TSH  . Comprehensive metabolic panel  . Lipid panel   Meds ordered this encounter  Medications  . Rivaroxaban (XARELTO) 15 MG TABS tablet    Sig: Take 1 tablet (15 mg total) by mouth daily with supper.    Dispense:  90 tablet    Refill:  3    Patient Instructions  Medication Instructions:  1)  STOP HYDRALAZINE 2) DECREASE XARELTO to 15 mg daily  Labwork: Your provider recommends that you have FASTING lab work when you come back for your return office visit.   Testing/Procedures: None  Follow-Up: You have an appointment scheduled with Dr. Antionette Char assistant, Nicki Reaper, on 07/09/2018 at 12:15PM. Please come fasting to this appointment for your blood work.  Any Other Special Instructions Will Be Listed Below (If Applicable).     If you need a refill on your cardiac medications before your next appointment, please call your pharmacy.      Signed, [name], MD  01/11/2018 1:18 PM    [place]

## 2018-01-11 NOTE — Telephone Encounter (Signed)
Mr. Murri was seen in clinic today. He has lost 50 pounds and discussed lipid therapy in detail with Dr. Burt Knack. At this time, he will not restart statin. He has follow-up scheduled in March and with have fasting labs drawn at that time to reassess lipids, LFTs, TSH and BMET.

## 2018-01-11 NOTE — Patient Instructions (Signed)
Medication Instructions:  1) STOP HYDRALAZINE 2) DECREASE XARELTO to 15 mg daily  Labwork: Your provider recommends that you have FASTING lab work when you come back for your return office visit.   Testing/Procedures: None  Follow-Up: You have an appointment scheduled with Dr. Antionette Char assistant, Nicki Reaper, on 07/09/2018 at 12:15PM. Please come fasting to this appointment for your blood work.  Any Other Special Instructions Will Be Listed Below (If Applicable).     If you need a refill on your cardiac medications before your next appointment, please call your pharmacy.

## 2018-01-12 NOTE — Telephone Encounter (Signed)
See Dr Honor Junes cardiology office note from 01/11/18,  "The patient is maintaining sinus rhythm on amiodarone.  He was quite sick when he had atrial fibrillation, and I think it is best to continue him on amiodarone at this point.  He is anticoagulated with rivaroxaban.  Reviewed his renal function and asked him to reduce the dose to 15 mg daily.  Also reviewed notes from GI and we have recommended that he hold rivaroxaban 3 days prior to colonoscopy.  Will need to be cautious with amiodarone in the setting of his elevated LFTs."   I have contacted patient to make sure he is aware that per Dr Burt Knack, he may hold xarelto 3 days prior to his colonoscopy procedure. Patient verbalizes understanding of this and states that Dr Burt Knack did tell him this at his visit yesterday as well.

## 2018-01-14 NOTE — Telephone Encounter (Signed)
Thanks Dottie for forwarding, I appreciate it.

## 2018-01-18 DIAGNOSIS — F410 Panic disorder [episodic paroxysmal anxiety] without agoraphobia: Secondary | ICD-10-CM | POA: Diagnosis not present

## 2018-01-18 DIAGNOSIS — F639 Impulse disorder, unspecified: Secondary | ICD-10-CM | POA: Diagnosis not present

## 2018-01-18 DIAGNOSIS — F332 Major depressive disorder, recurrent severe without psychotic features: Secondary | ICD-10-CM | POA: Diagnosis not present

## 2018-01-22 ENCOUNTER — Ambulatory Visit (INDEPENDENT_AMBULATORY_CARE_PROVIDER_SITE_OTHER): Payer: Medicaid Other | Admitting: Gastroenterology

## 2018-01-22 ENCOUNTER — Ambulatory Visit (AMBULATORY_SURGERY_CENTER): Payer: Self-pay | Admitting: *Deleted

## 2018-01-22 VITALS — Ht 74.5 in | Wt 248.0 lb

## 2018-01-22 DIAGNOSIS — Z1211 Encounter for screening for malignant neoplasm of colon: Secondary | ICD-10-CM

## 2018-01-22 DIAGNOSIS — K625 Hemorrhage of anus and rectum: Secondary | ICD-10-CM

## 2018-01-22 DIAGNOSIS — Z23 Encounter for immunization: Secondary | ICD-10-CM

## 2018-01-22 MED ORDER — NA SULFATE-K SULFATE-MG SULF 17.5-3.13-1.6 GM/177ML PO SOLN: ORAL | 0 refills | Status: DC

## 2018-01-22 NOTE — Progress Notes (Signed)
Patient denies any allergies to eggs or soy. Patient denies any problems with anesthesia/sedation. Patient denies any oxygen use at home. Patient denies taking any diet/weight loss medications. Pt is on blood thinners, instructions given. EMMI education assisgned to patient on colonoscopy, this was explained and instructions given to patient.

## 2018-01-29 ENCOUNTER — Telehealth: Payer: Self-pay

## 2018-01-29 DIAGNOSIS — R740 Nonspecific elevation of levels of transaminase and lactic acid dehydrogenase [LDH]: Secondary | ICD-10-CM

## 2018-01-29 DIAGNOSIS — B181 Chronic viral hepatitis B without delta-agent: Secondary | ICD-10-CM

## 2018-01-29 DIAGNOSIS — R768 Other specified abnormal immunological findings in serum: Secondary | ICD-10-CM

## 2018-01-29 DIAGNOSIS — R7401 Elevation of levels of liver transaminase levels: Secondary | ICD-10-CM

## 2018-01-29 NOTE — Telephone Encounter (Signed)
Called pt and LM to call back about scheduling appt 1st week in November and having labs done prior to appt.

## 2018-01-29 NOTE — Telephone Encounter (Signed)
-----   Message from Roetta Sessions, [place] sent at 01/04/2018  4:51 PM EDT ----- Regarding: labs due Dr. Havery Moros would like to see pt in early November - make an appt    And he would like him to have repeat labs (Hep B DNA, hep B E AG, LFTs)  to be done in first week of November to be discussed at early November appt

## 2018-01-30 NOTE — Telephone Encounter (Signed)
Called and spoke to pt.  Scheduled him for an appt with Dr.A on  03-16-18 and will mail him lab orders to take to his cardiologist office the week before appt where he prefers to have his labs drawn.

## 2018-02-01 ENCOUNTER — Encounter (HOSPITAL_COMMUNITY): Payer: Self-pay | Admitting: *Deleted

## 2018-02-01 ENCOUNTER — Other Ambulatory Visit: Payer: Self-pay

## 2018-02-02 ENCOUNTER — Other Ambulatory Visit: Payer: Self-pay

## 2018-02-06 ENCOUNTER — Ambulatory Visit (HOSPITAL_COMMUNITY): Payer: Medicaid Other | Admitting: Anesthesiology

## 2018-02-06 ENCOUNTER — Encounter (HOSPITAL_COMMUNITY)
Admission: RE | Disposition: A | Discharge status: Home / Self Care | Payer: Self-pay | Source: Ambulatory Visit | Attending: Gastroenterology

## 2018-02-06 ENCOUNTER — Encounter (HOSPITAL_COMMUNITY): Payer: Self-pay | Admitting: Emergency Medicine

## 2018-02-06 ENCOUNTER — Other Ambulatory Visit: Payer: Self-pay

## 2018-02-06 ENCOUNTER — Ambulatory Visit (HOSPITAL_COMMUNITY)
Admission: RE | Admit: 2018-02-06 | Discharge: 2018-02-06 | Disposition: A | Discharge status: Home / Self Care | Payer: Medicaid Other | Source: Ambulatory Visit | Attending: Gastroenterology | Admitting: Gastroenterology

## 2018-02-06 DIAGNOSIS — K921 Melena: Secondary | ICD-10-CM | POA: Diagnosis not present

## 2018-02-06 DIAGNOSIS — I2510 Atherosclerotic heart disease of native coronary artery without angina pectoris: Secondary | ICD-10-CM | POA: Insufficient documentation

## 2018-02-06 DIAGNOSIS — K635 Polyp of colon: Secondary | ICD-10-CM | POA: Diagnosis not present

## 2018-02-06 DIAGNOSIS — J449 Chronic obstructive pulmonary disease, unspecified: Secondary | ICD-10-CM | POA: Diagnosis not present

## 2018-02-06 DIAGNOSIS — F419 Anxiety disorder, unspecified: Secondary | ICD-10-CM | POA: Insufficient documentation

## 2018-02-06 DIAGNOSIS — Z87891 Personal history of nicotine dependence: Secondary | ICD-10-CM | POA: Insufficient documentation

## 2018-02-06 DIAGNOSIS — Z95810 Presence of automatic (implantable) cardiac defibrillator: Secondary | ICD-10-CM | POA: Diagnosis not present

## 2018-02-06 DIAGNOSIS — Z7901 Long term (current) use of anticoagulants: Secondary | ICD-10-CM | POA: Insufficient documentation

## 2018-02-06 DIAGNOSIS — F329 Major depressive disorder, single episode, unspecified: Secondary | ICD-10-CM | POA: Diagnosis not present

## 2018-02-06 DIAGNOSIS — K625 Hemorrhage of anus and rectum: Secondary | ICD-10-CM

## 2018-02-06 DIAGNOSIS — D123 Benign neoplasm of transverse colon: Secondary | ICD-10-CM | POA: Diagnosis not present

## 2018-02-06 DIAGNOSIS — G4730 Sleep apnea, unspecified: Secondary | ICD-10-CM | POA: Diagnosis not present

## 2018-02-06 DIAGNOSIS — K5730 Diverticulosis of large intestine without perforation or abscess without bleeding: Secondary | ICD-10-CM | POA: Diagnosis not present

## 2018-02-06 DIAGNOSIS — D126 Benign neoplasm of colon, unspecified: Secondary | ICD-10-CM | POA: Diagnosis not present

## 2018-02-06 DIAGNOSIS — N184 Chronic kidney disease, stage 4 (severe): Secondary | ICD-10-CM | POA: Diagnosis not present

## 2018-02-06 DIAGNOSIS — D128 Benign neoplasm of rectum: Secondary | ICD-10-CM | POA: Insufficient documentation

## 2018-02-06 DIAGNOSIS — Z8673 Personal history of transient ischemic attack (TIA), and cerebral infarction without residual deficits: Secondary | ICD-10-CM | POA: Diagnosis not present

## 2018-02-06 DIAGNOSIS — Z79899 Other long term (current) drug therapy: Secondary | ICD-10-CM | POA: Diagnosis not present

## 2018-02-06 DIAGNOSIS — Z7982 Long term (current) use of aspirin: Secondary | ICD-10-CM | POA: Insufficient documentation

## 2018-02-06 DIAGNOSIS — Z7951 Long term (current) use of inhaled steroids: Secondary | ICD-10-CM | POA: Insufficient documentation

## 2018-02-06 DIAGNOSIS — D124 Benign neoplasm of descending colon: Secondary | ICD-10-CM | POA: Diagnosis not present

## 2018-02-06 DIAGNOSIS — K648 Other hemorrhoids: Secondary | ICD-10-CM | POA: Diagnosis not present

## 2018-02-06 DIAGNOSIS — K621 Rectal polyp: Secondary | ICD-10-CM | POA: Diagnosis not present

## 2018-02-06 DIAGNOSIS — I130 Hypertensive heart and chronic kidney disease with heart failure and stage 1 through stage 4 chronic kidney disease, or unspecified chronic kidney disease: Secondary | ICD-10-CM | POA: Diagnosis not present

## 2018-02-06 DIAGNOSIS — I5022 Chronic systolic (congestive) heart failure: Secondary | ICD-10-CM | POA: Insufficient documentation

## 2018-02-06 DIAGNOSIS — D120 Benign neoplasm of cecum: Secondary | ICD-10-CM | POA: Diagnosis not present

## 2018-02-06 DIAGNOSIS — Z1211 Encounter for screening for malignant neoplasm of colon: Secondary | ICD-10-CM

## 2018-02-06 DIAGNOSIS — I480 Paroxysmal atrial fibrillation: Secondary | ICD-10-CM | POA: Diagnosis not present

## 2018-02-06 HISTORY — PX: COLONOSCOPY WITH PROPOFOL: SHX5780

## 2018-02-06 HISTORY — PX: POLYPECTOMY: SHX5525

## 2018-02-06 SURGERY — COLONOSCOPY WITH PROPOFOL
Anesthesia: Monitor Anesthesia Care

## 2018-02-06 MED ORDER — PROPOFOL 10 MG/ML IV BOLUS
INTRAVENOUS | Status: AC
  Filled 2018-02-06: qty 20

## 2018-02-06 MED ORDER — PROPOFOL 10 MG/ML IV BOLUS
INTRAVENOUS | Status: AC
  Filled 2018-02-06: qty 40

## 2018-02-06 MED ORDER — PROPOFOL 500 MG/50ML IV EMUL
INTRAVENOUS | Status: DC | PRN
  Administered 2018-02-06: 150 ug/kg/min via INTRAVENOUS

## 2018-02-06 MED ORDER — LACTATED RINGERS IV SOLN
INTRAVENOUS | Status: DC | PRN
  Administered 2018-02-06: 08:00:00 via INTRAVENOUS

## 2018-02-06 MED ORDER — SODIUM CHLORIDE 0.9 % IV SOLN: INTRAVENOUS | Status: DC

## 2018-02-06 SURGICAL SUPPLY — 22 items

## 2018-02-06 NOTE — H&P (Signed)
            HPI:   Cody Ortega is a 57 y.o. male history of CAD, CHF with ICD in place, atrial fibrillation on Xarelto, history of stroke, here for colonoscopy for screening and to evaluate rectal bleeding. He has not had Xarelto in 3-4 days (GFR 40s). Reports he had some bleeding with the bowel prep. No prior colonoscopy, otherwise feeling okay today.  Past Medical History:  Diagnosis Date  . Anxiety   . Arthritis   . Benzodiazepine dependence (H[facility])   . Bronchial asthma   . CAD (coronary artery disease)    a. Cath 03/2010: mod RCA stenosis, severe diag stenosis, treated medically given lack of angina.  . 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 Chronic systolic congestive heart failure (HCC)    Acute decompensation, LVEF less than 20%  . CKD (chronic kidney disease), stage IV (HCC)    Stage 3-4  . Depression   . History of cocaine abuse ([facility])   . Hypertension   . Microcytic anemia   . PAF (paroxysmal atrial fibrillation) (H[facility])    a. Episode in 2008 with spontaneous conversion  . S/P implantation of automatic cardioverter/defibrillator (AICD)    a. Medtronic, implanted 2012.  . Sleep apnea   . Stroke (H[facility]) 2004  . Thyroid disease     Past Surgical History:  Procedure Laterality Date  . CARDIOVERSION N/A 01/07/2014   Procedure: CARDIOVERSION;  Surgeon: Brian S Crenshaw, MD;  Location: [facility] ENDOSCOPY;  Service: Cardiovascular;  Laterality: N/A;  . CARDIOVERSION N/A 02/11/2014   Procedure: CARDIOVERSION;  Surgeon: Kenneth C. Hilty, MD;  Location: [facility] ENDOSCOPY;  Service: Cardiovascular;  Laterality: N/A;  . PACEMAKER INSERTION    . TEE WITHOUT CARDIOVERSION N/A 01/07/2014   Procedure: TRANSESOPHAGEAL ECHOCARDIOGRAM (TEE);  Surgeon: Brian S Crenshaw, MD;  Location: [facility] ENDOSCOPY;  Service: Cardiovascular;  Laterality: N/A;  . TEE WITHOUT CARDIOVERSION N/A 02/11/2014   Procedure: TRANSESOPHAGEAL ECHOCARDIOGRAM (TEE);  Surgeon: Kenneth C. Hilty, MD;  Location: [facility] ENDOSCOPY;   Service: Cardiovascular;  Laterality: N/A;  . TONSILLECTOMY    . TRANSTHORACIC ECHOCARDIOGRAM  10/2006, 12/2009    Family History  Problem Relation Age of Onset  . Breast cancer Mother   . Multiple myeloma Mother   . Prostate cancer Father   . Diabetes Father   . Peripheral vascular disease Father   . Cerebral palsy Daughter   . Lung disease Neg Hx   . Colon cancer Neg Hx   . Stomach cancer Neg Hx   . Esophageal cancer Neg Hx   . Colon polyps Neg Hx      Social History   Tobacco Use  . Smoking status: Former Smoker    Packs/day: 0.50    Years: 0.50    Pack years: 0.25    Types: Cigarettes    Last attempt to quit: 05/02/2010    Years since quitting: 7.7  . Smokeless tobacco: Never Used  . Tobacco comment: Significant Second-hand and only 3 months himself  Substance Use Topics  . Alcohol use: Not Currently    Alcohol/week: 0.0 standard drinks    Comment: rare  . Drug use: No    Comment: Reported history of cocaine abuse off and on     Prior to Admission medications   Medication Sig Start Date End Date Taking? Authorizing Provider  albuterol (PROAIR HFA) 108 (90 Base) MCG/ACT inhaler Inhale 2 puffs into the lungs every 6 (six) hours as needed for   wheezing or shortness of breath. 06/13/17  Yes Johnson, Deborah B, MD  alprazolam (XANAX) 2 MG tablet Take 2 mg by mouth every 6 (six) hours as needed for anxiety.    Yes [provider]  amiodarone (PACERONE) 200 MG tablet TAKE 1 TABLET(200 MG) BY MOUTH DAILY Patient taking differently: Take 200 mg by mouth daily.  11/14/17  Yes Cooper, Michael, MD  amLODipine (NORVASC) 10 MG tablet TAKE 1 TABLET BY MOUTH EVERY DAY Patient taking differently: Take 10 mg by mouth daily.  10/30/17  Yes Cooper, Michael, MD  aspirin 81 MG EC tablet Take 81 mg by mouth at bedtime.    Yes [provider]  budesonide-formoterol (SYMBICORT) 160-4.5 MCG/ACT inhaler Inhale 2 puffs into the lungs 2 (two) times daily. 06/16/17  Yes Parrett, Tammy  S, NP  carvedilol (COREG) 12.5 MG tablet Take 1 tablet (12.5 mg total) by mouth 2 (two) times daily with a meal. 05/18/17 05/13/18 Yes Cooper, Michael, MD  cycloSPORINE (RESTASIS) 0.05 % ophthalmic emulsion Place 1 drop into both eyes 2 (two) times daily.   Yes [provider]  DULoxetine (CYMBALTA) 60 MG capsule Take 60 mg by mouth daily.   Yes [provider]  ferrous sulfate 325 (65 FE) MG tablet Take 325 mg by mouth daily with breakfast.    Yes [provider]  Methylcellulose, Laxative, (CITRUCEL PO) Take 1 Dose by mouth daily as needed (constipation).   Yes [provider]  mometasone (NASONEX) 50 MCG/ACT nasal spray Place 2 sprays into the nose daily. Patient taking differently: Place 2 sprays into the nose every evening.  10/09/17  Yes Johnson, Deborah B, MD  Na Sulfate-K Sulfate-Mg Sulf 17.5-3.13-1.6 GM/177ML SOLN Suprep (no substitutions)-TAKE AS DIRECTED. 01/22/18  Yes ,  P, MD  Olopatadine HCl (PAZEO) 0.7 % SOLN Place 1 drop into both eyes daily.   Yes [provider]  ondansetron (ZOFRAN) 4 MG tablet Take 4 mg by mouth 2 (two) times daily as needed for nausea or vomiting.  01/18/18  Yes [provider]  PE-Shark Liver Oil-Cocoa Buttr (PREP-HEM RE) Place 1 application rectally daily as needed (hemorrhoid).   Yes [provider]  potassium chloride 20 MEQ/15ML (10%) SOLN Take 30 mLs (40 mEq total) by mouth daily. Patient taking differently: Take 40 mEq by mouth 2 (two) times a week.  06/12/17  Yes Johnson, Deborah B, MD  Rivaroxaban (XARELTO) 15 MG TABS tablet Take 1 tablet (15 mg total) by mouth daily with supper. 01/11/18 01/06/19 Yes Cooper, Michael, MD  sildenafil (REVATIO) 20 MG tablet TAKE 2-5 TABLETS BY MOUTH DAILY AS NEEDED PRIOR TO SEXUAL ACTIVITY Patient taking differently: Take 40-100 mg by mouth daily as needed (erectile dysfunction).  11/14/17  Yes Cooper, Michael, MD  Spacer/Aero-Holding Chambers  (AEROCHAMBER MV) inhaler Use as instructed 03/17/16  Yes Nestor, Jennings E, MD    Current Facility-Administered Medications  Medication Dose Route Frequency Provider Last Rate Last Dose  . 0.9 %  sodium chloride infusion   Intravenous Continuous ,  P, MD        Allergies as of 12/13/2017 - Review Complete 12/05/2017  Allergen Reaction Noted  . Codeine Itching, Nausea And Vomiting, and Hives 12/16/2009  . Hydrocodone-acetaminophen Itching and Nausea And Vomiting 12/16/2009     Review of Systems:    As per HPI, otherwise negative    Physical Exam:  Vital signs in last 24 hours: Temp:  [98.2 F (36.8 C)] 98.2 F (36.8 C) (10/08 0718) Pulse Rate:  [  59] 59 (10/08 0718) Resp:  [14] 14 (10/08 0718) BP: (151)/(82) 151/82 (10/08 0718) SpO2:  [99 %] 99 % (10/08 0718) Weight:  [113.4 kg] 113.4 kg (10/08 0718)   General:   Pleasant male in NAD Lungs:  Respirations even and unlabored. Lungs clear to auscultation bilaterally.    Heart:  Regular rate and rhythm; no MRG Abdomen:  Soft, nondistended, nontender. . No appreciable masses or hepatomegaly.   Lab Results  Component Value Date   WBC 8.8 05/30/2017   HGB 16.1 05/30/2017   HCT 46.5 05/30/2017   MCV 91 05/30/2017   PLT 277 05/30/2017    Lab Results  Component Value Date   CREATININE 1.77 (H) 12/28/2017   BUN 19 12/28/2017   NA 138 12/28/2017   K 3.8 12/28/2017   CL 104 12/28/2017   CO2 28 12/28/2017    Lab Results  Component Value Date   ALT 94 (H) 12/28/2017   AST 84 (H) 12/28/2017   ALKPHOS 94 12/28/2017   BILITOT 0.9 12/28/2017      Impression / Plan:    57 y/o male here for first time colonoscopy for rectal bleeding / screening purposes. History of CHF, CAD, ICD in place on Xarelto which has been held for 3 days. I have discussed risks / benefits of colonoscopy and anesthesia with him and he wishes to proceed. Further recommendations pending the results of this exam.    ,  MD [facility]   

## 2018-02-06 NOTE — Op Note (Signed)
[facility] Patient Name: Cody Ortega Procedure Date: 02/06/2018 MRN: [id number] Attending MD: Carlota Raspberry. Havery Moros , MD Date of Birth: 1961/03/02 CSN: [id number] Age: 57 Admit Type: Inpatient Procedure:                Colonoscopy Indications:              This is the patient's first colonoscopy,                            Hematochezia Providers:                Remo Lipps P. Havery Moros, MD, Cleda Daub, RN,                            Alan Mulder, Technician, Herbie Drape, CRNA Referring MD:              Medicines:                Monitored Anesthesia Care Complications:            No immediate complications. Estimated blood loss:                            Minimal. Estimated Blood Loss:     Estimated blood loss was minimal. Procedure:                Pre-Anesthesia Assessment:                           - Prior to the procedure, a History and Physical                            was performed, and patient medications and                            allergies were reviewed. The patient's tolerance of                            previous anesthesia was also reviewed. The risks                            and benefits of the procedure and the sedation                            options and risks were discussed with the patient.                            All questions were answered, and informed consent                            was obtained. Prior Anticoagulants: The patient has                            taken Xarelto (rivaroxaban), last dose was 3 days                            prior to  procedure. ASA Grade Assessment: III - A                            patient with severe systemic disease. After                            reviewing the risks and benefits, the patient was                            deemed in satisfactory condition to undergo the                            procedure.                           After obtaining informed consent, the colonoscope                   was passed under direct vision. Throughout the                            procedure, the patient's blood pressure, pulse, and                            oxygen saturations were monitored continuously. The                            CF-HQ190L ([id number] Olympus adult colonoscope was                            introduced through the anus and advanced to the the                            cecum, identified by appendiceal orifice and                            ileocecal valve. The colonoscopy was performed                            without difficulty. The patient tolerated the                            procedure well. The quality of the bowel                            preparation was adequate. The ileocecal valve,                            appendiceal orifice, and rectum were photographed. Scope In: 8:32:43 AM Scope Out: 9:08:24 AM Scope Withdrawal Time: 0 hours 31 minutes 9 seconds  Total Procedure Duration: 0 hours 35 minutes 41 seconds  Findings:      Porlapsed hemorrhoids were found on perianal exam.      A 3 mm polyp was found in the cecum. The polyp was sessile. The polyp       was removed with a  cold snare. Resection and retrieval were complete.      A 3 mm polyp was found in the hepatic flexure. The polyp was sessile.       The polyp was removed with a cold snare. Resection and retrieval were       complete.      Four sessile polyps were found in the descending colon. The polyps were       3 to 5 mm in size. These polyps were removed with a cold snare.       Resection and retrieval were complete.      A 4 mm polyp was found in the rectum. The polyp was sessile. The polyp       was removed with a cold snare. Resection and retrieval were complete.      Internal hemorrhoids were found during retroflexion. The hemorrhoids       were large. One suspected hemorrhoid had suspected prolapse /       inflammatory changes versus nodularity.      Multiple medium-mouthed  diverticula were found in the entire colon.      The exam was otherwise without abnormality. Several minutes were spent       lavaging the colon to achieve adequate views. Impression:               - Hemorrhoids found on perianal exam.                           - One 3 mm polyp in the cecum, removed with a cold                            snare. Resected and retrieved.                           - One 3 mm polyp at the hepatic flexure, removed                            with a cold snare. Resected and retrieved.                           - Four 3 to 5 mm polyps in the descending colon,                            removed with a cold snare. Resected and retrieved.                           - One 4 mm polyp in the rectum, removed with a cold                            snare. Resected and retrieved.                           - Internal hemorrhoids - one with suspected                            prolapse / inflammatory changes versus nodularity.  Biopsies not taken                           - Diverticulosis in the entire examined colon.                           - The examination was otherwise normal. Moderate Sedation:      No moderate sedation, case performed with MAC Recommendation:           - Patient has a contact number available for                            emergencies. The signs and symptoms of potential                            delayed complications were discussed with the                            patient. Return to normal activities tomorrow.                            Written discharge instructions were provided to the                            patient.                           - Resume previous diet.                           - Continue present medications.                           - Resume Xarelto tomorrow                           - Await pathology results.                           - Repeat colonoscopy for surveillance based on                             pathology results.                           - Surgical consultation for large inflamed                            hemorrhoids given endoscopic appearance /                            anticoagulated status. I suspect the largest area                            of changes reflects associated prolapse /  inflammatory changes, but nodularity / adenomatous                            change is possible Procedure Code(s):        --- Professional ---                           [id number], Colonoscopy, flexible; with removal of                            tumor(s), polyp(s), or other lesion(s) by snare                            technique Diagnosis Code(s):        --- Professional ---                           K64.8, Other hemorrhoids                           D12.0, Benign neoplasm of cecum                           D12.3, Benign neoplasm of transverse colon (hepatic                            flexure or splenic flexure)                           K62.1, Rectal polyp                           D12.4, Benign neoplasm of descending colon                           K92.1, Melena (includes Hematochezia)                           K57.30, Diverticulosis of large intestine without                            perforation or abscess without bleeding CPT copyright 2018 [employer]. All rights reserved. The codes documented in this report are preliminary and upon coder review may  be revised to meet current compliance requirements. Remo Lipps P. Cody Manzer, MD 02/06/2018 9:19:29 AM This report has been signed electronically. Number of Addenda: 0

## 2018-02-06 NOTE — Interval H&P Note (Signed)
History and Physical Interval Note:  02/06/2018 8:17 AM  Cody Ortega  has presented today for surgery, with the diagnosis of rectal bleeding/internal hemorrhoids/colon ca screening/jmh  The various methods of treatment have been discussed with the patient and family. After consideration of risks, benefits and other options for treatment, the patient has consented to  Procedure(s): COLONOSCOPY WITH PROPOFOL (N/A) as a surgical intervention .  The patient's history has been reviewed, patient examined, no change in status, stable for surgery.  I have reviewed the patient's chart and labs.  Questions were answered to the patient's satisfaction.     [place]

## 2018-02-06 NOTE — Anesthesia Postprocedure Evaluation (Signed)
Anesthesia Post Note  Patient: Cody Ortega  Procedure(s) Performed: COLONOSCOPY WITH PROPOFOL (N/A ) POLYPECTOMY     Patient location during evaluation: PACU Anesthesia Type: MAC Level of consciousness: awake and alert Pain management: pain level controlled Vital Signs Assessment: post-procedure vital signs reviewed and stable Respiratory status: spontaneous breathing, nonlabored ventilation, respiratory function stable and patient connected to nasal cannula oxygen Cardiovascular status: stable and blood pressure returned to baseline Postop Assessment: no apparent nausea or vomiting Anesthetic complications: no    Last Vitals:  Vitals:   02/06/18 0930 02/06/18 0940  BP: (!) 151/93 (!) 158/90  Pulse: (!) 47 (!) 44  Resp: 20 18  Temp:    SpO2: 95% 96%    Last Pain:  Vitals:   02/06/18 0940  TempSrc:   PainSc: 0-No pain                 Montez Hageman

## 2018-02-06 NOTE — Transfer of Care (Signed)
Immediate Anesthesia Transfer of Care Note  Patient: Cody Ortega  Procedure(s) Performed: COLONOSCOPY WITH PROPOFOL (N/A ) POLYPECTOMY  Patient Location: PACU  Anesthesia Type:MAC  Level of Consciousness: sedated, patient cooperative and responds to stimulation  Airway & Oxygen Therapy: Patient Spontanous Breathing and Patient connected to face mask oxygen  Post-op Assessment: Report given to RN and Post -op Vital signs reviewed and stable  Post vital signs: Reviewed and stable  Last Vitals:  Vitals Value Taken Time  BP 123/61 02/06/2018  9:14 AM  Temp    Pulse 44 02/06/2018  9:16 AM  Resp 15 02/06/2018  9:16 AM  SpO2 100 % 02/06/2018  9:16 AM  Vitals shown include unvalidated device data.  Last Pain:  Vitals:   02/06/18 0718  TempSrc: Oral  PainSc: 0-No pain         Complications: No apparent anesthesia complications

## 2018-02-06 NOTE — Anesthesia Preprocedure Evaluation (Signed)
Anesthesia Evaluation  Patient identified by MRN, date of birth, ID band Patient awake    Reviewed: Allergy & Precautions, NPO status , Patient's Chart, lab work & pertinent test results  Airway Mallampati: II  TM Distance: >3 FB Neck ROM: Full    Dental no notable dental hx. (+) Missing   Pulmonary neg pulmonary ROS, asthma , COPD, former smoker,    Pulmonary exam normal breath sounds clear to auscultation       Cardiovascular hypertension, +CHF (EF 35%)  Normal cardiovascular exam+ Cardiac Defibrillator  Rhythm:Regular Rate:Normal     Neuro/Psych Anxiety Depression CVA, No Residual Symptoms negative neurological ROS  negative psych ROS   GI/Hepatic negative GI ROS, (+)     substance abuse (distant history of substance abuse)  cocaine use,   Endo/Other  negative endocrine ROS  Renal/GU negative Renal ROS  negative genitourinary   Musculoskeletal negative musculoskeletal ROS (+)   Abdominal   Peds negative pediatric ROS (+)  Hematology negative hematology ROS (+)   Anesthesia Other Findings   Reproductive/Obstetrics negative OB ROS                             Anesthesia Physical Anesthesia Plan  ASA: III  Anesthesia Plan: MAC   Post-op Pain Management:    Induction: Intravenous  PONV Risk Score and Plan: 1 and Ondansetron  Airway Management Planned: Nasal Cannula  Additional Equipment:   Intra-op Plan:   Post-operative Plan: Extubation in OR  Informed Consent: I have reviewed the patients History and Physical, chart, labs and discussed the procedure including the risks, benefits and alternatives for the proposed anesthesia with the patient or authorized representative who has indicated his/her understanding and acceptance.   Dental advisory given  Plan Discussed with: CRNA  Anesthesia Plan Comments:         Anesthesia Quick Evaluation

## 2018-02-06 NOTE — Discharge Instructions (Signed)
Resume Xarelto tomorrow on 02/08/19.      YOU HAD AN ENDOSCOPIC PROCEDURE TODAY: Refer to the procedure report and other information in the discharge instructions given to you for any specific questions about what was found during the examination. If this information does not answer your questions, please call [place] office at [phone] to clarify.   YOU SHOULD EXPECT: Some feelings of bloating in the abdomen. Passage of more gas than usual. Walking can help get rid of the air that was put into your GI tract during the procedure and reduce the bloating. If you had a lower endoscopy (such as a colonoscopy or flexible sigmoidoscopy) you may notice spotting of blood in your stool or on the toilet paper. Some abdominal soreness may be present for a day or two, also.  DIET: Your first meal following the procedure should be a light meal and then it is ok to progress to your normal diet. A half-sandwich or bowl of soup is an example of a good first meal. Heavy or fried foods are harder to digest and may make you feel nauseous or bloated. Drink plenty of fluids but you should avoid alcoholic beverages for 24 hours. If you had a esophageal dilation, please see attached instructions for diet.    ACTIVITY: Your care partner should take you home directly after the procedure. You should plan to take it easy, moving slowly for the rest of the day. You can resume normal activity the day after the procedure however YOU SHOULD NOT DRIVE, use power tools, machinery or perform tasks that involve climbing or major physical exertion for 24 hours (because of the sedation medicines used during the test).   SYMPTOMS TO REPORT IMMEDIATELY: A gastroenterologist can be reached at any hour. Please call [phone]  for any of the following symptoms:   Following lower endoscopy (colonoscopy, flexible sigmoidoscopy) Excessive amounts of blood in the stool  Significant tenderness, worsening of abdominal pains  Swelling of  the abdomen that is new, acute  Fever of 100 or higher   FOLLOW UP:  If any biopsies were taken you will be contacted by phone or by letter within the next 1-3 weeks. Call [phone]  if you have not heard about the biopsies in 3 weeks.  Please also call with any specific questions about appointments or follow up tests.

## 2018-02-09 ENCOUNTER — Ambulatory Visit: Payer: Medicaid Other | Attending: Internal Medicine | Admitting: Internal Medicine

## 2018-02-09 ENCOUNTER — Encounter: Payer: Self-pay | Admitting: Internal Medicine

## 2018-02-09 VITALS — BP 118/71 | HR 58 | Temp 98.2°F | Resp 16 | Wt 244.4 lb

## 2018-02-09 DIAGNOSIS — G894 Chronic pain syndrome: Secondary | ICD-10-CM | POA: Insufficient documentation

## 2018-02-09 DIAGNOSIS — Z95810 Presence of automatic (implantable) cardiac defibrillator: Secondary | ICD-10-CM | POA: Insufficient documentation

## 2018-02-09 DIAGNOSIS — Z9889 Other specified postprocedural states: Secondary | ICD-10-CM | POA: Insufficient documentation

## 2018-02-09 DIAGNOSIS — E7800 Pure hypercholesterolemia, unspecified: Secondary | ICD-10-CM | POA: Diagnosis not present

## 2018-02-09 DIAGNOSIS — I495 Sick sinus syndrome: Secondary | ICD-10-CM | POA: Insufficient documentation

## 2018-02-09 DIAGNOSIS — Z885 Allergy status to narcotic agent status: Secondary | ICD-10-CM | POA: Diagnosis not present

## 2018-02-09 DIAGNOSIS — B1910 Unspecified viral hepatitis B without hepatic coma: Secondary | ICD-10-CM | POA: Insufficient documentation

## 2018-02-09 DIAGNOSIS — K219 Gastro-esophageal reflux disease without esophagitis: Secondary | ICD-10-CM | POA: Insufficient documentation

## 2018-02-09 DIAGNOSIS — Z7982 Long term (current) use of aspirin: Secondary | ICD-10-CM | POA: Diagnosis not present

## 2018-02-09 DIAGNOSIS — I10 Essential (primary) hypertension: Secondary | ICD-10-CM | POA: Diagnosis not present

## 2018-02-09 DIAGNOSIS — G4733 Obstructive sleep apnea (adult) (pediatric): Secondary | ICD-10-CM | POA: Diagnosis not present

## 2018-02-09 DIAGNOSIS — Z7901 Long term (current) use of anticoagulants: Secondary | ICD-10-CM | POA: Diagnosis not present

## 2018-02-09 DIAGNOSIS — Z8042 Family history of malignant neoplasm of prostate: Secondary | ICD-10-CM | POA: Insufficient documentation

## 2018-02-09 DIAGNOSIS — D126 Benign neoplasm of colon, unspecified: Secondary | ICD-10-CM

## 2018-02-09 DIAGNOSIS — B1911 Unspecified viral hepatitis B with hepatic coma: Secondary | ICD-10-CM

## 2018-02-09 DIAGNOSIS — I2510 Atherosclerotic heart disease of native coronary artery without angina pectoris: Secondary | ICD-10-CM | POA: Diagnosis not present

## 2018-02-09 DIAGNOSIS — Z87891 Personal history of nicotine dependence: Secondary | ICD-10-CM | POA: Diagnosis not present

## 2018-02-09 DIAGNOSIS — I130 Hypertensive heart and chronic kidney disease with heart failure and stage 1 through stage 4 chronic kidney disease, or unspecified chronic kidney disease: Secondary | ICD-10-CM | POA: Diagnosis not present

## 2018-02-09 DIAGNOSIS — Z79899 Other long term (current) drug therapy: Secondary | ICD-10-CM | POA: Insufficient documentation

## 2018-02-09 DIAGNOSIS — K649 Unspecified hemorrhoids: Secondary | ICD-10-CM | POA: Diagnosis not present

## 2018-02-09 DIAGNOSIS — I480 Paroxysmal atrial fibrillation: Secondary | ICD-10-CM | POA: Insufficient documentation

## 2018-02-09 DIAGNOSIS — I5022 Chronic systolic (congestive) heart failure: Secondary | ICD-10-CM | POA: Diagnosis not present

## 2018-02-09 DIAGNOSIS — Z803 Family history of malignant neoplasm of breast: Secondary | ICD-10-CM | POA: Insufficient documentation

## 2018-02-09 DIAGNOSIS — J449 Chronic obstructive pulmonary disease, unspecified: Secondary | ICD-10-CM | POA: Diagnosis not present

## 2018-02-09 DIAGNOSIS — N529 Male erectile dysfunction, unspecified: Secondary | ICD-10-CM | POA: Diagnosis not present

## 2018-02-09 DIAGNOSIS — Z8249 Family history of ischemic heart disease and other diseases of the circulatory system: Secondary | ICD-10-CM | POA: Insufficient documentation

## 2018-02-09 DIAGNOSIS — Z6830 Body mass index (BMI) 30.0-30.9, adult: Secondary | ICD-10-CM | POA: Diagnosis not present

## 2018-02-09 DIAGNOSIS — N184 Chronic kidney disease, stage 4 (severe): Secondary | ICD-10-CM | POA: Diagnosis not present

## 2018-02-09 DIAGNOSIS — E669 Obesity, unspecified: Secondary | ICD-10-CM | POA: Diagnosis not present

## 2018-02-09 DIAGNOSIS — Z820 Family history of epilepsy and other diseases of the nervous system: Secondary | ICD-10-CM | POA: Insufficient documentation

## 2018-02-09 MED ORDER — HYDROCORTISONE ACETATE 25 MG RE SUPP: 25.0000 mg | Freq: Two times a day (BID) | RECTAL | 1 refills | Status: DC

## 2018-02-09 NOTE — Progress Notes (Signed)
Patient ID: Cody Ortega, male    DOB: 09-18-1960  MRN: [id number]  CC: Follow-up (4 month )   Subjective: Cody Ortega is a 57 y.o. male who presents for chronic ds management.  His concerns today include:  The patient with history of COPD and asthma, HTN, CKD stage III, CAD, andNICM with chronic systolic CHF and ICD, PAF,elevator LFT,dep/anx(followed by psychiatrist, Noemi Chapel)  Obesity: loss additional 16 lbs since last visit.  He has been swimming several times a week at a recreational center pool that is across the street from his house.  He reports better energy level with her weight loss.  Had c-scope recently by Dr. Havery Moros.  He had 7 polyps removed some of which were adenomatous polyps.  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  Dr. Havery Moros has recommended holding off on treatment for 3 to 6 months to determine whether spontaneous seroconversion will occur.  He is not sure how he may have acquired it.  He is never had blood transfusion, has had only one sexual partner for a number of years, does not have any tattoos and has never done intravenous drug use.  CAD/HTN/PAF:  Taken off Hydralazine by cardiology, Dr. Burt Knack, because of low blood pressure readings.  Since then he reports his blood pressure readings at home have been good.  He denies any chest pains, shortness of breath, lower extremity edema or palpitations.  COPD:  Saw Dr. Lamonte Sakai in June.  He is on Symbicort and does well with that.  He has not had to use albuterol inhaler in quite a while..    Patient Active Problem List   Diagnosis Date Noted  . Rectal bleeding   . Colon cancer screening   . Benign neoplasm of colon   . Osteoarthritis of both knees 06/12/2017  . COPD with asthma ([place]) 03/17/2016  . Chronic seasonal allergic rhinitis 03/17/2016  . GERD (gastroesophageal reflux disease) 03/17/2016  . Chronic  kidney disease (CKD), stage IV (severe) ([place]) 03/17/2016  . Chronic pain syndrome 03/16/2015  . Obstructive sleep apnea 03/16/2015  . Chronic low back pain with right-sided sciatica 03/16/2015  . Sinus brady-tachy syndrome ([place]) 02/20/2014  . Atrial fibrillation ([place]) 12/31/2013  . Automatic implantable cardioverter-defibrillator in situ 03/08/2011  . Erectile dysfunction 02/28/2011  . CAD, NATIVE VESSEL 04/26/2010  . PURE HYPERCHOLESTEROLEMIA 04/12/2010  . HLD (hyperlipidemia) 01/11/2010  . Essential hypertension, malignant 12/18/2009  . Chronic systolic heart failure ([place]) 12/18/2009     Current Outpatient Medications on File Prior to Visit  Medication Sig Dispense Refill  . albuterol (PROAIR HFA) 108 (90 Base) MCG/ACT inhaler Inhale 2 puffs into the lungs every 6 (six) hours as needed for wheezing or shortness of breath. 1 Inhaler 2  . alprazolam (XANAX) 2 MG tablet Take 2 mg by mouth every 6 (six) hours as needed for anxiety.     Marland Kitchen amiodarone (PACERONE) 200 MG tablet TAKE 1 TABLET(200 MG) BY MOUTH DAILY (Patient taking differently: Take 200 mg by mouth daily. ) 30 tablet 5  . amLODipine (NORVASC) 10 MG tablet TAKE 1 TABLET BY MOUTH EVERY DAY (Patient taking differently: Take 10 mg by mouth daily. ) 30 tablet 5  . aspirin 81 MG EC tablet Take 81 mg by mouth at bedtime.     . budesonide-formoterol (SYMBICORT) 160-4.5 MCG/ACT inhaler Inhale 2 puffs into the lungs 2 (two) times daily. 1 Inhaler 4  .  carvedilol (COREG) 12.5 MG tablet Take 1 tablet (12.5 mg total) by mouth 2 (two) times daily with a meal. 180 tablet 3  . cycloSPORINE (RESTASIS) 0.05 % ophthalmic emulsion Place 1 drop into both eyes 2 (two) times daily.    . DULoxetine (CYMBALTA) 60 MG capsule Take 60 mg by mouth daily.    . ferrous sulfate 325 (65 FE) MG tablet Take 325 mg by mouth daily with breakfast.     . Methylcellulose, Laxative, (CITRUCEL PO) Take 1 Dose by mouth daily as needed (constipation).    . mometasone  (NASONEX) 50 MCG/ACT nasal spray Place 2 sprays into the nose daily. (Patient taking differently: Place 2 sprays into the nose every evening. ) 17 g 5  . Na Sulfate-K Sulfate-Mg Sulf 17.5-3.13-1.6 GM/177ML SOLN Suprep (no substitutions)-TAKE AS DIRECTED. 354 mL 0  . Olopatadine HCl (PAZEO) 0.7 % SOLN Place 1 drop into both eyes daily.    . ondansetron (ZOFRAN) 4 MG tablet Take 4 mg by mouth 2 (two) times daily as needed for nausea or vomiting.   0  . PE-Shark Liver Oil-Cocoa Buttr (PREP-HEM RE) Place 1 application rectally daily as needed (hemorrhoid).    . potassium chloride 20 MEQ/15ML (10%) SOLN Take 30 mLs (40 mEq total) by mouth daily. (Patient taking differently: Take 40 mEq by mouth 2 (two) times a week. ) 900 mL 6  . Rivaroxaban (XARELTO) 15 MG TABS tablet Take 1 tablet (15 mg total) by mouth daily with supper. 90 tablet 3  . sildenafil (REVATIO) 20 MG tablet TAKE 2-5 TABLETS BY MOUTH DAILY AS NEEDED PRIOR TO SEXUAL ACTIVITY (Patient taking differently: Take 40-100 mg by mouth daily as needed (erectile dysfunction). ) 180 tablet 3  . Spacer/Aero-Holding Chambers (AEROCHAMBER MV) inhaler Use as instructed 1 each 0   No current facility-administered medications on file prior to visit.     Allergies  Allergen Reactions  . Codeine Itching, Nausea And Vomiting and Hives  . Hydrocodone-Acetaminophen Itching and Nausea And Vomiting    Social History   Socioeconomic History  . Marital status: Divorced    Spouse name: Not on file  . Number of children: 2  . Years of education: Not on file  . Highest education level: Not on file  Occupational History  . 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 Financial resource strain: Not on file  . Food insecurity:    Worry: Not on file    Inability: Not on file  . Transportation needs:    Medical: Not on file    Non-medical: Not on file  Tobacco Use  . Smoking status: Former  Smoker    Packs/day: 0.50    Years: 0.50    Pack years: 0.25    Types: Cigarettes    Last attempt to quit: 05/02/2010    Years since quitting: 7.7  . Smokeless tobacco: Never Used  . Tobacco comment: Significant Second-hand and only 3 months himself  Substance and Sexual Activity  . Alcohol use: Not Currently    Alcohol/week: 0.0 standard drinks    Comment: rare  . Drug use: No    Comment: Reported history of cocaine abuse off and on   . Sexual activity: Not on file  Lifestyle  . Physical activity:    Days per week: Not on file    Minutes per session: Not on file  . Stress: Not on file  Relationships  .  Social connections:    Talks on phone: Not on file    Gets together: Not on file    Attends religious service: Not on file    Active member of club or organization: Not on file    Attends meetings of clubs or organizations: Not on file    Relationship status: Not on file  . Intimate partner violence:    Fear of current or ex partner: Not on file    Emotionally abused: Not on file    Physically abused: Not on file    Forced sexual activity: Not on file  Other Topics Concern  . Not on file  Social History Narrative   Living with a son who is a 42 year old.  He is not     working, unemployed for 3 years.  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  Denied use of alcohol.  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       [facility]:   Originally from [place], [place]. He grew up in [place]. He moved to [place] in 1985. 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 No pets currently. No bird exposure. He did have mold in a previous home. No hot tub exposure.          Family History  Problem Relation Age of Onset  . Breast cancer Mother   . Multiple myeloma Mother   . Prostate cancer Father   . Diabetes Father   . Peripheral vascular disease Father   . Cerebral palsy Daughter   . Lung disease Neg Hx     . Colon cancer Neg Hx   . Stomach cancer Neg Hx   . Esophageal cancer Neg Hx   . Colon polyps Neg Hx     Past Surgical History:  Procedure Laterality Date  . CARDIOVERSION N/A 01/07/2014   Procedure: CARDIOVERSION;  Surgeon: Lelon Perla, MD;  Location: [facility] ENDOSCOPY;  Service: Cardiovascular;  Laterality: N/A;  . CARDIOVERSION N/A 02/11/2014   Procedure: CARDIOVERSION;  Surgeon: Pixie Casino, MD;  Location: [facility];  Service: Cardiovascular;  Laterality: N/A;  . COLONOSCOPY WITH PROPOFOL N/A 02/06/2018   Procedure: COLONOSCOPY WITH PROPOFOL;  Surgeon: Yetta Flock, MD;  Location: [facility] ENDOSCOPY;  Service: Gastroenterology;  Laterality: N/A;  . PACEMAKER INSERTION    . POLYPECTOMY  02/06/2018   Procedure: POLYPECTOMY;  Surgeon: Yetta Flock, MD;  Location: [facility] ENDOSCOPY;  Service: Gastroenterology;;  . TEE WITHOUT CARDIOVERSION N/A 01/07/2014   Procedure: TRANSESOPHAGEAL ECHOCARDIOGRAM (TEE);  Surgeon: Lelon Perla, MD;  Location: [facility] ENDOSCOPY;  Service: Cardiovascular;  Laterality: N/A;  . TEE WITHOUT CARDIOVERSION N/A 02/11/2014   Procedure: TRANSESOPHAGEAL ECHOCARDIOGRAM (TEE);  Surgeon: Pixie Casino, MD;  Location: [facility]COPY;  Service: Cardiovascular;  Laterality: N/A;  . TONSILLECTOMY    . TRANSTHORACIC ECHOCARDIOGRAM  10/2006, 12/2009    ROS: Review of Systems Negative except as above. PHYSICAL EXAM: BP 118/71   Pulse (!) 58   Temp 98.2 F (36.8 C) (Oral)   Resp 16   Wt 244 lb 6.4 oz (110.9 kg)   SpO2 96%   BMI 31.38 kg/m   Wt Readings from Last 3 Encounters:  02/09/18 244 lb 6.4 oz (110.9 kg)  02/06/18 250 lb (113.4 kg)  01/22/18 248 lb (112.5 kg)    Physical Exam  General appearance - alert, well appearing, and in no distress Mental status -  normal mood, behavior, speech, dress, motor activity, and thought processes Neck - supple, no significant adenopathy Chest - clear to auscultation, no wheezes, rales or rhonchi, symmetric air  entry Heart - normal rate, regular rhythm, normal S1, S2, no murmurs, rubs, clicks or gallops Extremities - peripheral pulses normal, no pedal edema, no clubbing or cyanosis   ASSESSMENT AND PLAN: 1. Essential hypertension At goal.  Continue current medications  2. Obesity (BMI 30-39.9) Commended him on his weight loss.  Encouraged him to stay active.  The pool that he was using has now closed for the fall.  Encouraged him to go to [facility] to use the pool they are more to do other exercises.  3. Hemorrhoids, unspecified hemorrhoid type 4. Adenomatous polyp of colon, unspecified part of colon Followed by GI. Encourage him to keep bowel movements soft and regular.  Will refill Anusol.  Sitz baths also recommended. - hydrocortisone (ANUSOL-HC) 25 MG suppository; Place 1 suppository (25 mg total) rectally 2 (two) times daily.  Dispense: 12 suppository; Refill: 1  5. 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 agent with hepatic coma, unspecified chronicity Followed by GI.  Plan is to recheck him in several months to see if he seroconverts.  Avoid sharing toothbrushes or razors.  Screen for hep C and hep A were negative    Patient was given the opportunity to ask questions.  Patient verbalized understanding of the plan and was able to repeat key elements of the plan.   No orders of the defined types were placed in this encounter.    Requested Prescriptions   Signed Prescriptions Disp Refills  . hydrocortisone (ANUSOL-HC) 25 MG suppository 12 suppository 1    Sig: Place 1 suppository (25 mg total) rectally 2 (two) times daily.    Return in about 4 months (around 06/12/2018).  Karle Plumber, MD, FACP

## 2018-02-27 DIAGNOSIS — H2511 Age-related nuclear cataract, right eye: Secondary | ICD-10-CM | POA: Diagnosis not present

## 2018-02-27 DIAGNOSIS — H18413 Arcus senilis, bilateral: Secondary | ICD-10-CM | POA: Diagnosis not present

## 2018-02-27 DIAGNOSIS — H25043 Posterior subcapsular polar age-related cataract, bilateral: Secondary | ICD-10-CM | POA: Diagnosis not present

## 2018-02-27 DIAGNOSIS — H04123 Dry eye syndrome of bilateral lacrimal glands: Secondary | ICD-10-CM | POA: Diagnosis not present

## 2018-02-27 DIAGNOSIS — H2513 Age-related nuclear cataract, bilateral: Secondary | ICD-10-CM | POA: Diagnosis not present

## 2018-03-06 ENCOUNTER — Telehealth: Payer: Self-pay | Admitting: Cardiovascular Disease

## 2018-03-06 NOTE — Telephone Encounter (Signed)
New Message:   Patient calling to see the results are in  Dr, Jonne Ply

## 2018-03-07 ENCOUNTER — Other Ambulatory Visit (HOSPITAL_COMMUNITY)
Admit: 2018-03-07 | Discharge: 2018-03-07 | Disposition: A | Discharge status: Home / Self Care | Payer: Medicaid Other | Attending: Gastroenterology | Admitting: Gastroenterology

## 2018-03-07 DIAGNOSIS — R768 Other specified abnormal immunological findings in serum: Secondary | ICD-10-CM | POA: Insufficient documentation

## 2018-03-07 LAB — HEPATIC FUNCTION PANEL
ALT: 113 U/L — ABNORMAL HIGH (ref 0–44)
AST: 129 U/L — ABNORMAL HIGH (ref 15–41)
Albumin: 3.2 g/dL — ABNORMAL LOW (ref 3.5–5.0)
Alkaline Phosphatase: 90 U/L (ref 38–126)
BILIRUBIN INDIRECT: 0.3 mg/dL (ref 0.3–0.9)
Bilirubin, Direct: 0.2 mg/dL (ref 0.0–0.2)
TOTAL PROTEIN: 7.5 g/dL (ref 6.5–8.1)
Total Bilirubin: 0.5 mg/dL (ref 0.3–1.2)

## 2018-03-08 ENCOUNTER — Telehealth: Payer: Self-pay | Admitting: Gastroenterology

## 2018-03-08 LAB — HEPATITIS B E ANTIGEN: HEP B E AG: POSITIVE — AB

## 2018-03-08 NOTE — Telephone Encounter (Signed)
Kim at [facility] needs clarification on an order from Dr. Havery Moros that was sent yesterday. Pls call her.

## 2018-03-08 NOTE — Telephone Encounter (Signed)
Almyra Free I had previously ordered the patient LFTs, hepatitis B E ag and hepatitis B DNA level to be done this week. This order was placed a while ago, I did not put in any new orders yesterday unless they were put in my name by someone else. Unfortunately only the LFTs and hep B E antigen was drawn, the hepatitis B DNA level was not drawn and I don't know why. Can you ask the lab if they can add on Hep B DNA to what they already have, or the patient will need to go back to the lab to have that drawn. Thanks

## 2018-03-08 NOTE — Telephone Encounter (Signed)
Routed to Dr. Armbruster. 

## 2018-03-08 NOTE — Telephone Encounter (Signed)
Lab advised 

## 2018-03-10 LAB — HBV REAL-TIME PCR, QUANT
HBV AS IU/ML: 33300000 IU/mL
LOG10 HBV AS IU/ML: 7.522 log10 IU/mL

## 2018-03-10 LAB — HEPATITIS B DNA, ULTRAQUANTITATIVE, PCR

## 2018-03-16 ENCOUNTER — Encounter: Payer: Self-pay | Admitting: Gastroenterology

## 2018-03-16 ENCOUNTER — Ambulatory Visit: Payer: Medicaid Other | Admitting: Gastroenterology

## 2018-03-16 VITALS — BP 126/72 | HR 61 | Ht 74.5 in | Wt 246.0 lb

## 2018-03-16 DIAGNOSIS — R932 Abnormal findings on diagnostic imaging of liver and biliary tract: Secondary | ICD-10-CM | POA: Diagnosis not present

## 2018-03-16 DIAGNOSIS — K649 Unspecified hemorrhoids: Secondary | ICD-10-CM | POA: Diagnosis not present

## 2018-03-16 DIAGNOSIS — B181 Chronic viral hepatitis B without delta-agent: Secondary | ICD-10-CM | POA: Diagnosis not present

## 2018-03-16 MED ORDER — TENOFOVIR ALAFENAMIDE FUMARATE 25 MG PO TABS: 25.0000 mg | ORAL_TABLET | Freq: Every day | ORAL | 3 refills | Status: DC

## 2018-03-16 NOTE — Progress Notes (Signed)
HPI :  57 year old male history of CAD, CHF with ICD in place, atrial fibrillation on Xarelto, history of stroke, history of hepatitis B, history of rectal bleeding due to hemorrhoids, here for a follow up visit.  Since his initial visit with me, he was noted to have elevated liver enzymes and our evaluation of him revealed a diagnosis of hepatitis B which was new.. His viral load is > 20K, ALT remains > 100, and hepatitis E AG (+). We held off on treatment initially and repeat her labs and for 3 months, which shows persistent active hepatitis B. He did have some imaging in January when he presented acutely to the hospital in pain. He had an [place] on 05/20/17 which showed fatty liver and a dilated pancreatic duct, along with sludge in the GB. He had a subsequent CT scan on the same day showing a ? Nodular liver with enlarged caudate lobe, raising question of cirrhosis but "not definitive". Normal pancreas and spleen. He is not aware of any history of liver disease. He denies any alcohol use. On review of his chart he's had an ALT elevation dating back to 2008. He questions how long he's had hepatitis B. He denies any tattoos, no needle sticks, no IV drug use. He does have a history of cocaine use. He was not immune to hepatitis A and has started the vaccination process for that. Of note, he does have CKD with GFR in the 40s.  Otherwise he underwent a colonoscopy with [place] in October, he had large inflamed internal hemorrhoids causing bleeding the setting of anticoagulation. He was referred for surgical opinion of this which he has not yet had. Otherwise he had 7 small adenomas removed as well as diverticulosis. He is a repeat colonoscopy in 3 years.  Colonoscopy 02/06/2018 - 7 small polyps, diverticulosis, hemorrhoids - adenomas - repeat in 3 years   Past Medical History:  Diagnosis Date  . Anxiety   . Arthritis   . Benzodiazepine dependence ([place])   . Bronchial asthma   . CAD (coronary artery disease)      a. Cath 03/2010: mod RCA stenosis, severe diag stenosis, treated medically given lack of angina.  . 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 Chronic systolic congestive heart failure (HCC)    Acute decompensation, LVEF less than 20%  . CKD (chronic kidney disease), stage IV (HCC)    Stage 3-4  . Depression   . Diverticulosis   . Hepatitis B   . History of cocaine abuse ([place])   . Hypertension   . Microcytic anemia   . PAF (paroxysmal atrial fibrillation) ([place])    a. Episode in 2008 with spontaneous conversion  . S/P implantation of automatic cardioverter/defibrillator (AICD)    a. Medtronic, implanted 2012.  . Sleep apnea   . Stroke [facility]) 2004  . Thyroid disease      Past Surgical History:  Procedure Laterality Date  . CARDIOVERSION N/A 01/07/2014   Procedure: CARDIOVERSION;  Surgeon: Lelon Perla, MD;  Location: [facility] ENDOSCOPY;  Service: Cardiovascular;  Laterality: N/A;  . CARDIOVERSION N/A 02/11/2014   Procedure: CARDIOVERSION;  Surgeon: Pixie Casino, MD;  Location: [place];  Service: Cardiovascular;  Laterality: N/A;  . COLONOSCOPY WITH PROPOFOL N/A 02/06/2018   Procedure: COLONOSCOPY WITH PROPOFOL;  Surgeon: Yetta Flock, MD;  Location: [facility] ENDOSCOPY;  Service: Gastroenterology;  Laterality: N/A;  . PACEMAKER INSERTION    . POLYPECTOMY  02/06/2018   Procedure: POLYPECTOMY;  Surgeon: Havery Moros,  Carlota Raspberry, MD;  Location: [facility] ENDOS[facility];  Service: Gastroenterology;;  . TEE WITHOUT CARDIOVERSION N/A 01/07/2014   Procedure: TRANSESOPHAGEAL ECHOCARDIOGRAM (TEE);  Surgeon: Lelon Perla, MD;  Location: [facility] ENDOSCOPY;  Service: Cardiovascular;  Laterality: N/A;  . TEE WITHOUT CARDIOVERSION N/A 02/11/2014   Procedure: TRANSESOPHAGEAL ECHOCARDIOGRAM (TEE);  Surgeon: Pixie Casino, MD;  Location: [facility] ENDOSCOPY;  Service: Cardiovascular;  Laterality: N/A;  . TONSILLECTOMY    . TRANSTHORACIC ECHOCARDIOGRAM  10/2006, 12/2009   Family History  Problem Relation Age  of Onset  . Breast cancer Mother   . Multiple myeloma Mother   . Prostate cancer Father   . Diabetes Father   . Peripheral vascular disease Father   . Cerebral palsy Daughter   . Lung disease Neg Hx   . Colon cancer Neg Hx   . Stomach cancer Neg Hx   . Esophageal cancer Neg Hx   . Colon polyps Neg Hx    Social History   Tobacco Use  . Smoking status: Former Smoker    Packs/day: 0.50    Years: 0.50    Pack years: 0.25    Types: Cigarettes    Last attempt to quit: 05/02/2010    Years since quitting: 7.8  . Smokeless tobacco: Never Used  . Tobacco comment: Significant Second-hand and only 3 months himself  Substance Use Topics  . Alcohol use: Not Currently    Alcohol/week: 0.0 standard drinks    Comment: rare  . Drug use: No    Comment: Reported history of cocaine abuse off and on    Current Outpatient Medications  Medication Sig Dispense Refill  . albuterol (PROAIR HFA) 108 (90 Base) MCG/ACT inhaler Inhale 2 puffs into the lungs every 6 (six) hours as needed for wheezing or shortness of breath. 1 Inhaler 2  . alprazolam (XANAX) 2 MG tablet Take 2 mg by mouth every 6 (six) hours as needed for anxiety.     Marland Kitchen amiodarone (PACERONE) 200 MG tablet TAKE 1 TABLET(200 MG) BY MOUTH DAILY (Patient taking differently: Take 200 mg by mouth daily. ) 30 tablet 5  . amLODipine (NORVASC) 10 MG tablet TAKE 1 TABLET BY MOUTH EVERY DAY (Patient taking differently: Take 10 mg by mouth daily. ) 30 tablet 5  . aspirin 81 MG EC tablet Take 81 mg by mouth at bedtime.     . budesonide-formoterol (SYMBICORT) 160-4.5 MCG/ACT inhaler Inhale 2 puffs into the lungs 2 (two) times daily. 1 Inhaler 4  . carvedilol (COREG) 12.5 MG tablet Take 1 tablet (12.5 mg total) by mouth 2 (two) times daily with a meal. 180 tablet 3  . cycloSPORINE (RESTASIS) 0.05 % ophthalmic emulsion Place 1 drop into both eyes 2 (two) times daily.    . DULoxetine (CYMBALTA) 60 MG capsule Take 60 mg by mouth daily.    . ferrous sulfate  325 (65 FE) MG tablet Take 325 mg by mouth daily with breakfast.     . hydrocortisone (ANUSOL-HC) 25 MG suppository Place 1 suppository (25 mg total) rectally 2 (two) times daily. 12 suppository 1  . mometasone (NASONEX) 50 MCG/ACT nasal spray Place 2 sprays into the nose daily. (Patient taking differently: Place 2 sprays into the nose every evening. ) 17 g 5  . Olopatadine HCl (PAZEO) 0.7 % SOLN Place 1 drop into both eyes daily.    . ondansetron (ZOFRAN) 4 MG tablet Take 4 mg by mouth 2 (two) times daily as needed for nausea or vomiting.   0  .  potassium chloride 20 MEQ/15ML (10%) SOLN Take 30 mLs (40 mEq total) by mouth daily. (Patient taking differently: Take 40 mEq by mouth 2 (two) times a week. ) 900 mL 6  . Rivaroxaban (XARELTO) 15 MG TABS tablet Take 1 tablet (15 mg total) by mouth daily with supper. 90 tablet 3  . sildenafil (REVATIO) 20 MG tablet TAKE 2-5 TABLETS BY MOUTH DAILY AS NEEDED PRIOR TO SEXUAL ACTIVITY (Patient taking differently: Take 40-100 mg by mouth daily as needed (erectile dysfunction). ) 180 tablet 3  . Spacer/Aero-Holding Chambers (AEROCHAMBER MV) inhaler Use as instructed 1 each 0  . Tenofovir Alafenamide Fumarate (VEMLIDY) 25 MG TABS Take 1 tablet (25 mg total) by mouth daily. 90 tablet 3   No current facility-administered medications for this visit.    Allergies  Allergen Reactions  . Codeine Itching, Nausea And Vomiting and Hives  . Hydrocodone-Acetaminophen Itching and Nausea And Vomiting     Review of Systems: All systems reviewed and negative except where noted in HPI.   Lab Results  Component Value Date   WBC 8.8 05/30/2017   HGB 16.1 05/30/2017   HCT 46.5 05/30/2017   MCV 91 05/30/2017   PLT 277 05/30/2017    Lab Results  Component Value Date   CREATININE 1.77 (H) 12/28/2017   BUN 19 12/28/2017   NA 138 12/28/2017   K 3.8 12/28/2017   CL 104 12/28/2017   CO2 28 12/28/2017    Lab Results  Component Value Date   ALT 113 (H) 03/07/2018    AST 129 (H) 03/07/2018   ALKPHOS 90 03/07/2018   BILITOT 0.5 03/07/2018     Physical Exam: BP 126/72   Pulse 61   Ht 6' 2.5" (1.892 m)   Wt 246 lb (111.6 kg)   BMI 31.16 kg/m  Constitutional: Pleasant,well-developed, male in no acute distress. HEENT: Normocephalic and atraumatic. Conjunctivae are normal. No scleral icterus. Neck supple.  Cardiovascular: Normal rate, regular rhythm.  Pulmonary/chest: Effort normal and breath sounds normal. No wheezing, rales or rhonchi. Abdominal: Soft, nondistended, nontender. There are no masses palpable. No hepatomegaly. Extremities: no edema Lymphadenopathy: No cervical adenopathy noted. Neurological: Alert and oriented to person place and time. Skin: Skin is warm and dry. No rashes noted. Psychiatric: Normal mood and affect. Behavior is normal.   ASSESSMENT AND PLAN: 57 year old male here for reassessment following issues:  Hepatitis B / abnormal liver imaging - he has active hepatitis B with a high viral load, positive E antigen, elevated ALT which has persisted now for the past few months. Prior imaging questions the possibility for cirrhosis. He has cirrhosis he is compensated at this time. AFP normal. HIV negative, hep C negative. I discussed what hepatitis B is with him and the possibility for cirrhosis, and what that entails. Given his labs have persistently showed no significant changes, recommended we start treatment for hepatitis B. We discussed options, I'm recommending Vemlidy '25mg'$  per day given his history of CKD. Will check CMET in 1 month, and then repeat his hepatitis B labs again in 3 months, and will see him again in the office at that time. I'm otherwise going to repeat an ultrasound of his liver, reassess for cirrhosis changes. If he is determined to likely have cirrhosis, will schedule for an upper endoscopy to screen for varices. He will follow up for the remainder of his hepatitis A vaccine. He agreed with the plan, questions  answered.  Hemorrhoids - large, inflamed, continues to have intermittent bleeding while on  anticoagulation. He was previously referred to surgery for evaluation, he will follow up with them for this.  History of colon adenomas - repeat colonoscopy in 3 years.  Kennedy Cellar, MD [facility]

## 2018-03-16 NOTE — Patient Instructions (Addendum)
If you are age 55 or older, your body mass index should be between 23-30. Your Body mass index is 31.16 kg/m. If this is out of the aforementioned range listed, please consider follow up with your Primary Care Provider.  If you are age 49 or younger, your body mass index should be between 19-25. Your Body mass index is 31.16 kg/m. If this is out of the aformentioned range listed, please consider follow up with your Primary Care Provider.   We have sent the following medications to your pharmacy for you to pick up at your convenience: Vemlidy 25 mg: take daily  You have been scheduled for an abdominal ultrasound at [facility] (1st floor of hospital) on Wednesday, 03-21-18 at 10:30am. Please arrive 15 minutes prior to your appointment for registration. Make certain not to have anything to eat or drink 6 hours prior to your appointment. Should you need to reschedule your appointment, please contact radiology at [phone]. This test typically takes about 30 minutes to perform.  You will be due for lab work in one month.  Please have a CMET done the week of 12-16.19.  We are giving you printed lab orders to take with you today to be drawn at your preference of location.  [facility] will reach out to you to schedule you for an appointment.   We have called them to follow up regarding this appointment.    You will be due for a follow up appt in early March.  We will remind you when it is time to schedule that appointment.  Thank you for entrusting me with your care and for choosing [facility], Dr. Willards Cellar

## 2018-03-20 ENCOUNTER — Telehealth: Payer: Self-pay

## 2018-03-20 ENCOUNTER — Telehealth: Payer: Self-pay | Admitting: Gastroenterology

## 2018-03-20 DIAGNOSIS — R7401 Elevation of levels of liver transaminase levels: Secondary | ICD-10-CM

## 2018-03-20 DIAGNOSIS — R740 Nonspecific elevation of levels of transaminase and lactic acid dehydrogenase [LDH]: Secondary | ICD-10-CM

## 2018-03-20 DIAGNOSIS — B181 Chronic viral hepatitis B without delta-agent: Secondary | ICD-10-CM

## 2018-03-20 MED ORDER — ENTECAVIR 0.5 MG PO TABS: 0.5000 mg | ORAL_TABLET | ORAL | 5 refills | Status: DC

## 2018-03-20 NOTE — Addendum Note (Signed)
Addended by: Roetta Sessions on: 03/20/2018 04:04 PM   Modules accepted: Orders

## 2018-03-20 NOTE — Telephone Encounter (Signed)
Dr. Havery Moros - Vemlidy 25 mg is not covered by Medicaid.  Preferred drugs are as follows and do not require a Prior Auth:  entecavir (generic for Baraclude) Lamivudine HBV tablets and  Viread (powder or tabs)  Please advise. Thank you.

## 2018-03-20 NOTE — Telephone Encounter (Signed)
Thanks for letting me know Cody Ortega was preferred due to his renal function.  If it is not covered, recommend Entecavir 0.5mg  EVERY OTHER DAY in light of his renal function (reduced frequency).  I would check renal function 2-4 weeks after starting therapy. Can you let me know if this is covered? Thanks much

## 2018-03-20 NOTE — Addendum Note (Signed)
Addended by: Roetta Sessions on: 03/20/2018 05:00 PM   Modules accepted: Orders

## 2018-03-20 NOTE — Telephone Encounter (Signed)
Yes that sounds fine. Thanks 

## 2018-03-20 NOTE — Telephone Encounter (Signed)
Spoke to [facility].  Vemlidy not covered.  Dr. Havery Moros changed script to a formulary medication, entecavir (generic for [facility]). See phone note.

## 2018-03-20 NOTE — Telephone Encounter (Signed)
Sent prescription for Entecavir 0.5mg  every other day to [facility].  Called pt and LM with instructions to take every other day.  Would you like a BMET in 3 weeks?

## 2018-03-21 ENCOUNTER — Telehealth: Payer: Self-pay | Admitting: Gastroenterology

## 2018-03-21 ENCOUNTER — Encounter: Payer: Self-pay | Admitting: Gastroenterology

## 2018-03-21 ENCOUNTER — Ambulatory Visit (HOSPITAL_COMMUNITY): Payer: Medicaid Other | Attending: Gastroenterology

## 2018-03-21 NOTE — Telephone Encounter (Signed)
Error

## 2018-03-21 NOTE — Telephone Encounter (Signed)
  Called and LM for [facility].  We switched to a different medication that is covered by insurance. PA no longer needed for [facility].

## 2018-03-22 ENCOUNTER — Telehealth: Payer: Self-pay | Admitting: Emergency Medicine

## 2018-03-22 DIAGNOSIS — J302 Other seasonal allergic rhinitis: Secondary | ICD-10-CM

## 2018-03-22 NOTE — Telephone Encounter (Signed)
lmtcb x1 for pt. 

## 2018-03-23 MED ORDER — MOMETASONE FUROATE 50 MCG/ACT NA SUSP: 2.0000 | Freq: Every day | NASAL | 5 refills | Status: DC

## 2018-03-23 NOTE — Telephone Encounter (Signed)
Called and spoke with pt. Pt needing a refill of nasonex nasal spray. I stated to pt I would take care of sending med in for him.   Pt expressed understanding. I verified pt's preferred pharmacy and sent refill in. Nothing further needed.

## 2018-03-26 ENCOUNTER — Other Ambulatory Visit: Payer: Self-pay | Admitting: Adult Health

## 2018-04-02 ENCOUNTER — Telehealth: Payer: Self-pay | Admitting: *Deleted

## 2018-04-02 NOTE — Telephone Encounter (Signed)
   [facility] Pre-operative Risk Assessment    Request for surgical clearance:  1. What type of surgery is being performed? CATARACT EXTRACTION W/INTROCULAR LENS IMPLANTATION OF THE RIGHT FOLLOWED BY THE LEFT EYE   2. When is this surgery scheduled? 05/18/18 & 06/08/18   3. Are there any medications that need to be held prior to surgery and how long? XARELTO, ASA    4. Practice name and name of physician performing surgery? [facility]; DR. Christia Reading BEVIS    5. What is your office phone and fax number? PH# [phone]; FAX# [phone]   6. Anesthesia type (None, local, MAC, general) ? TOPICAL ANESTHESIA W/IV MEDICATION   Julaine Hua 04/02/2018, 4:57 PM  _________________________________________________________________   (provider comments below)

## 2018-04-03 NOTE — Telephone Encounter (Signed)
   Primary Cardiologist: [name], MD  Chart reviewed as part of pre-operative protocol coverage. Cataract extractions are recognized in guidelines as low risk surgeries that do not typically require specific preoperative testing or holding of blood thinner therapy. Therefore, given past medical history and time since last visit, based on ACC/AHA guidelines, Cody Ortega would be at acceptable risk for the planned procedure without further cardiovascular testing.   I will route this recommendation to the requesting party via Epic fax function and remove from pre-op pool.  Please call with questions.  Truitt Merle, NP 04/03/2018, 9:12 AM

## 2018-04-09 ENCOUNTER — Telehealth: Payer: Self-pay

## 2018-04-09 ENCOUNTER — Other Ambulatory Visit: Payer: Self-pay

## 2018-04-09 DIAGNOSIS — B181 Chronic viral hepatitis B without delta-agent: Secondary | ICD-10-CM

## 2018-04-09 NOTE — Progress Notes (Signed)
Pt due for CMET to evaluate new medication

## 2018-04-09 NOTE — Telephone Encounter (Signed)
Called and LM for pt to have lab work done

## 2018-04-09 NOTE — Telephone Encounter (Signed)
-----   Message from Roetta Sessions, [place] sent at 03/20/2018  4:53 PM EST ----- Regarding: pt due for BMET Call pt and ask him to have blood drawn around Wednesday, 12-11 to check renal function. Started new med for Hep B around 03-21-18 (entecavir). BMET order is in and his cardiologist office should be able to see the order.

## 2018-04-11 ENCOUNTER — Ambulatory Visit (INDEPENDENT_AMBULATORY_CARE_PROVIDER_SITE_OTHER): Payer: Medicaid Other | Admitting: *Deleted

## 2018-04-11 ENCOUNTER — Telehealth: Payer: Self-pay | Admitting: Emergency Medicine

## 2018-04-11 DIAGNOSIS — I255 Ischemic cardiomyopathy: Secondary | ICD-10-CM

## 2018-04-11 NOTE — Telephone Encounter (Signed)
PA request received from [facility]: Nasonex Called [employer] to initiate at [phone] PA has been sent to plan, a determination is expected within 24 hours.  PA#: [id number] Ref#: [id number]  Sending to Ria Comment for follow-up.

## 2018-04-12 NOTE — Telephone Encounter (Signed)
Pt called back and is going to the lab today

## 2018-04-12 NOTE — Telephone Encounter (Signed)
Called and LM for pt

## 2018-04-13 LAB — CUP PACEART INCLINIC DEVICE CHECK
Brady Statistic RV Percent Paced: 0.22 %
Date Time Interrogation Session: 20191211225638
HIGH POWER IMPEDANCE MEASURED VALUE: 46 Ohm
HighPow Impedance: 323 Ohm
HighPow Impedance: 60 Ohm
Implantable Lead Model: 185
Implantable Lead Serial Number: 345870
Lead Channel Impedance Value: 323 Ohm
Lead Channel Pacing Threshold Amplitude: 1.25 V
Lead Channel Sensing Intrinsic Amplitude: 8.375 mV
MDC IDC LEAD IMPLANT DT: 20120806
MDC IDC LEAD LOCATION: 753860
MDC IDC MSMT BATTERY VOLTAGE: 2.78 V
MDC IDC MSMT LEADCHNL RV PACING THRESHOLD PULSEWIDTH: 0.4 ms
MDC IDC MSMT LEADCHNL RV SENSING INTR AMPL: 7.125 mV
MDC IDC PG IMPLANT DT: 20120806
MDC IDC SET LEADCHNL RV PACING AMPLITUDE: 2.5 V
MDC IDC SET LEADCHNL RV PACING PULSEWIDTH: 0.4 ms
MDC IDC SET LEADCHNL RV SENSING SENSITIVITY: 0.3 mV

## 2018-04-13 LAB — COMPREHENSIVE METABOLIC PANEL
ALBUMIN: 3.8 g/dL (ref 3.5–5.0)
ALT: 89 U/L — ABNORMAL HIGH (ref 0–44)
ANION GAP: 12 (ref 5–15)
AST: 74 U/L — ABNORMAL HIGH (ref 15–41)
Alkaline Phosphatase: 85 U/L (ref 38–126)
BILIRUBIN TOTAL: 1 mg/dL (ref 0.3–1.2)
BUN: 18 mg/dL (ref 6–20)
CALCIUM: 9.5 mg/dL (ref 8.9–10.3)
CO2: 27 mmol/L (ref 22–32)
Chloride: 99 mmol/L (ref 98–111)
Creatinine, Ser: 1.76 mg/dL — ABNORMAL HIGH (ref 0.61–1.24)
GFR calc non Af Amer: 43 mL/min — ABNORMAL LOW (ref >60)
GFR, EST AFRICAN AMERICAN: 49 mL/min — AB (ref >60)
Glucose, Bld: 112 mg/dL — ABNORMAL HIGH (ref 70–99)
POTASSIUM: 3.5 mmol/L (ref 3.5–5.1)
SODIUM: 138 mmol/L (ref 135–145)
TOTAL PROTEIN: 8.5 g/dL — AB (ref 6.5–8.1)

## 2018-04-13 NOTE — Telephone Encounter (Signed)
As of today, I have not received an approval or denial. Will continue to monitor.

## 2018-04-13 NOTE — Telephone Encounter (Signed)
Called [employer] to check status of PA, this PA has been approved through 04/06/2019. Lorane to make aware.  Nothing further needed at this time.

## 2018-04-14 ENCOUNTER — Other Ambulatory Visit: Payer: Self-pay | Admitting: Cardiovascular Disease

## 2018-04-16 ENCOUNTER — Other Ambulatory Visit: Payer: Self-pay

## 2018-04-16 ENCOUNTER — Emergency Department (HOSPITAL_COMMUNITY)
Admission: EM | Admit: 2018-04-16 | Discharge: 2018-04-16 | Disposition: A | Discharge status: Home / Self Care | Payer: Medicaid Other | Attending: Emergency Medicine | Admitting: Emergency Medicine

## 2018-04-16 ENCOUNTER — Other Ambulatory Visit: Payer: Self-pay | Admitting: Cardiovascular Disease

## 2018-04-16 ENCOUNTER — Encounter (HOSPITAL_COMMUNITY): Payer: Self-pay

## 2018-04-16 DIAGNOSIS — R748 Abnormal levels of other serum enzymes: Secondary | ICD-10-CM

## 2018-04-16 DIAGNOSIS — K047 Periapical abscess without sinus: Secondary | ICD-10-CM

## 2018-04-16 DIAGNOSIS — N184 Chronic kidney disease, stage 4 (severe): Secondary | ICD-10-CM | POA: Insufficient documentation

## 2018-04-16 DIAGNOSIS — I130 Hypertensive heart and chronic kidney disease with heart failure and stage 1 through stage 4 chronic kidney disease, or unspecified chronic kidney disease: Secondary | ICD-10-CM | POA: Diagnosis not present

## 2018-04-16 DIAGNOSIS — B9789 Other viral agents as the cause of diseases classified elsewhere: Secondary | ICD-10-CM | POA: Diagnosis not present

## 2018-04-16 DIAGNOSIS — J069 Acute upper respiratory infection, unspecified: Secondary | ICD-10-CM | POA: Diagnosis not present

## 2018-04-16 DIAGNOSIS — Z8673 Personal history of transient ischemic attack (TIA), and cerebral infarction without residual deficits: Secondary | ICD-10-CM | POA: Insufficient documentation

## 2018-04-16 DIAGNOSIS — Z7901 Long term (current) use of anticoagulants: Secondary | ICD-10-CM | POA: Diagnosis not present

## 2018-04-16 DIAGNOSIS — Z79899 Other long term (current) drug therapy: Secondary | ICD-10-CM | POA: Diagnosis not present

## 2018-04-16 DIAGNOSIS — Z7982 Long term (current) use of aspirin: Secondary | ICD-10-CM | POA: Diagnosis not present

## 2018-04-16 DIAGNOSIS — I2510 Atherosclerotic heart disease of native coronary artery without angina pectoris: Secondary | ICD-10-CM | POA: Insufficient documentation

## 2018-04-16 DIAGNOSIS — R05 Cough: Secondary | ICD-10-CM | POA: Diagnosis not present

## 2018-04-16 DIAGNOSIS — K0889 Other specified disorders of teeth and supporting structures: Secondary | ICD-10-CM | POA: Diagnosis present

## 2018-04-16 DIAGNOSIS — I5022 Chronic systolic (congestive) heart failure: Secondary | ICD-10-CM | POA: Insufficient documentation

## 2018-04-16 DIAGNOSIS — Z87891 Personal history of nicotine dependence: Secondary | ICD-10-CM | POA: Diagnosis not present

## 2018-04-16 DIAGNOSIS — Z950 Presence of cardiac pacemaker: Secondary | ICD-10-CM | POA: Diagnosis not present

## 2018-04-16 MED ORDER — PENICILLIN V POTASSIUM 500 MG PO TABS: 500.0000 mg | ORAL_TABLET | Freq: Four times a day (QID) | ORAL | 0 refills | Status: DC

## 2018-04-16 MED ORDER — GUAIFENESIN ER 1200 MG PO TB12: 1.0000 | ORAL_TABLET | Freq: Two times a day (BID) | ORAL | 0 refills | Status: DC

## 2018-04-16 MED ORDER — TRAMADOL HCL 50 MG PO TABS: 50.0000 mg | ORAL_TABLET | Freq: Four times a day (QID) | ORAL | 0 refills | Status: DC | PRN

## 2018-04-16 MED ORDER — PREDNISONE 50 MG PO TABS: 50.0000 mg | ORAL_TABLET | Freq: Every day | ORAL | 0 refills | Status: DC

## 2018-04-16 NOTE — ED Triage Notes (Signed)
Pt reports that he has left ear pain and left sided dental pain. Pt reports that he has also had a head cold cause pain and pressure in head. Pt reports fevers since Friday. PT reports yesterday fever spiked 103.

## 2018-04-16 NOTE — ED Provider Notes (Signed)
[facility] DEPT Provider Note   CSN: [id number] Arrival date & time: 04/16/18  1005     History   Chief Complaint Chief Complaint  Patient presents with  . Otalgia  . Dental Pain  . Fever    HPI Cody Ortega is a 57 y.o. male.  HPI Patient presents to the emergency department with left ear pain and left upper dental pain.  Patient states he is also had some nasal congestion and mild cough over the last 2 days.  The patient states that he had fevers since Friday.  Patient states that he felt like his temperature got worse yesterday and was 103.  Patient states that he has not had any vomiting associated with this.  The patient denies chest pain, shortness of breath, headache,blurred vision, neck pain, or throat, weakness, numbness, dizziness, anorexia, edema, abdominal pain, nausea, vomiting, diarrhea, rash, back pain, dysuria, hematemesis, bloody stool, near syncope, or syncope. Past Medical History:  Diagnosis Date  . Anxiety   . Arthritis   . Benzodiazepine dependence ([place])   . Bronchial asthma   . CAD (coronary artery disease)    a. Cath 03/2010: mod RCA stenosis, severe diag stenosis, treated medically given lack of angina.  . 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 Chronic systolic congestive heart failure (HCC)    Acute decompensation, LVEF less than 20%  . CKD (chronic kidney disease), stage IV (HCC)    Stage 3-4  . Depression   . Diverticulosis   . Hepatitis B   . History of cocaine abuse ([place])   . Hypertension   . Microcytic anemia   . PAF (paroxysmal atrial fibrillation) ([place])    a. Episode in 2008 with spontaneous conversion  . S/P implantation of automatic cardioverter/defibrillator (AICD)    a. Medtronic, implanted 2012.  . Sleep apnea   . Stroke [facility]) 2004  . Thyroid disease     Patient Active Problem List   Diagnosis Date Noted  . Adenomatous polyp of colon 02/09/2018  . 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 Rectal bleeding   . Colon cancer screening   . Benign neoplasm of colon   . Osteoarthritis of both knees 06/12/2017  . 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 Chronic seasonal allergic rhinitis 03/17/2016  . GERD (gastroesophageal reflux disease) 03/17/2016  . Chronic kidney disease (CKD), stage IV (severe) ([place]) 03/17/2016  . Chronic pain syndrome 03/16/2015  . Obstructive sleep apnea 03/16/2015  . Chronic low back pain with right-sided sciatica 03/16/2015  . Sinus brady-tachy syndrome ([place]) 02/20/2014  . Atrial fibrillation ([place]) 12/31/2013  . Automatic implantable cardioverter-defibrillator in situ 03/08/2011  . Erectile dysfunction 02/28/2011  . CAD, NATIVE VESSEL 04/26/2010  . PURE HYPERCHOLESTEROLEMIA 04/12/2010  . HLD (hyperlipidemia) 01/11/2010  . Essential hypertension, malignant 12/18/2009  . Chronic systolic heart failure ([place]) 12/18/2009    Past Surgical History:  Procedure Laterality Date  . CARDIOVERSION N/A 01/07/2014   Procedure: CARDIOVERSION;  Surgeon: Lelon Perla, MD;  Location: [facility] ENDOSCOPY;  Service: Cardiovascular;  Laterality: N/A;  . CARDIOVERSION N/A 02/11/2014   Procedure: CARDIOVERSION;  Surgeon: Pixie Casino, MD;  Location: [facility];  Service: Cardiovascular;  Laterality: N/A;  . COLONOSCOPY WITH PROPOFOL N/A 02/06/2018   Procedure: COLONOSCOPY WITH PROPOFOL;  Surgeon: Yetta Flock, MD;  Location: [facility] ENDOSCOPY;  Service: Gastroenterology;  Laterality: N/A;  . PACEMAKER INSERTION    . POLYPECTOMY  02/06/2018   Procedure:  POLYPECTOMY;  Surgeon: Yetta Flock, MD;  Location: [facility] ENDOS[facility];  Service: Gastroenterology;;  . TEE WITHOUT CARDIOVERSION N/A 01/07/2014   Procedure: TRANSESOPHAGEAL ECHOCARDIOGRAM (TEE);  Surgeon: Lelon Perla, MD;  Location: [facility] ENDOSCOPY;  Service: Cardiovascular;  Laterality: N/A;  . TEE WITHOUT CARDIOVERSION N/A 02/11/2014   Procedure: TRANSESOPHAGEAL ECHOCARDIOGRAM  (TEE);  Surgeon: Pixie Casino, MD;  Location: [facility] ENDOSCOPY;  Service: Cardiovascular;  Laterality: N/A;  . TONSILLECTOMY    . TRANSTHORACIC ECHOCARDIOGRAM  10/2006, 12/2009        Home Medications    Prior to Admission medications   Medication Sig Start Date End Date Taking? Authorizing Provider  albuterol (PROAIR HFA) 108 (90 Base) MCG/ACT inhaler Inhale 2 puffs into the lungs every 6 (six) hours as needed for wheezing or shortness of breath. 06/13/17  Yes Ladell Pier, MD  alprazolam Duanne Moron) 2 MG tablet Take 2 mg by mouth every 6 (six) hours as needed for anxiety.    Yes [provider]  amiodarone (PACERONE) 200 MG tablet TAKE 1 TABLET(200 MG) BY MOUTH DAILY 11/14/17  Yes [name], MD  amLODipine (NORVASC) 10 MG tablet TAKE 1 TABLET BY MOUTH EVERY DAY Patient taking differently: Take 10 mg by mouth daily.  10/30/17  Yes [name], MD  aspirin 81 MG EC tablet Take 81 mg by mouth at bedtime.    Yes [provider]  carvedilol (COREG) 12.5 MG tablet TAKE 1 TABLET(12.5 MG) BY MOUTH TWICE DAILY WITH A MEAL 04/16/18  Yes [name], MD  cycloSPORINE (RESTASIS) 0.05 % ophthalmic emulsion Place 1 drop into both eyes 2 (two) times daily.   Yes [provider]  DULoxetine (CYMBALTA) 60 MG capsule Take 60 mg by mouth daily.   Yes [provider]  entecavir (BARACLUDE) 0.5 MG tablet Take 1 tablet (0.5 mg total) by mouth every other day. Please note: Every OTHER day 03/20/18  Yes Armbruster, Carlota Raspberry, MD  ferrous sulfate 325 (65 FE) MG tablet Take 325 mg by mouth daily with breakfast.    Yes [provider]  hydrocortisone (ANUSOL-HC) 25 MG suppository Place 1 suppository (25 mg total) rectally 2 (two) times daily. 02/09/18  Yes Ladell Pier, MD  mometasone (NASONEX) 50 MCG/ACT nasal spray Place 2 sprays into the nose daily. 03/23/18  Yes Collene Gobble, MD  Olopatadine HCl (PAZEO) 0.7 % SOLN Place 1 drop into both eyes daily.    Yes [provider]  ondansetron (ZOFRAN) 4 MG tablet Take 4 mg by mouth 2 (two) times daily as needed for nausea or vomiting.  01/18/18  Yes [provider]  Rivaroxaban (XARELTO) 15 MG TABS tablet Take 1 tablet (15 mg total) by mouth daily with supper. 01/11/18 01/06/19 Yes [name], MD  sildenafil (REVATIO) 20 MG tablet TAKE 2-5 TABLETS BY MOUTH DAILY AS NEEDED PRIOR TO SEXUAL ACTIVITY Patient taking differently: Take 40-100 mg by mouth daily as needed (erectile dysfunction).  11/14/17  Yes [name], MD  Spacer/Aero-Holding Chambers (AEROCHAMBER MV) inhaler Use as instructed 03/17/16  Yes Javier Glazier, MD  SYMBICORT 160-4.5 MCG/ACT inhaler INHALE 2 PUFFS INTO THE LUNGS TWICE DAILY 03/26/18  Yes Collene Gobble, MD  potassium chloride 20 MEQ/15ML (10%) SOLN Take 30 mLs (40 mEq total) by mouth daily. Patient not taking: Reported on 04/16/2018 06/12/17   Ladell Pier, MD    Family History Family History  Problem Relation Age of Onset  . Breast cancer Mother   . Multiple myeloma  Mother   . Prostate cancer Father   . Diabetes Father   . Peripheral vascular disease Father   . Cerebral palsy Daughter   . Lung disease Neg Hx   . Colon cancer Neg Hx   . Stomach cancer Neg Hx   . Esophageal cancer Neg Hx   . Colon polyps Neg Hx     Social History Social History   Tobacco Use  . Smoking status: Former Smoker    Packs/day: 0.50    Years: 0.50    Pack years: 0.25    Types: Cigarettes    Last attempt to quit: 05/02/2010    Years since quitting: 7.9  . Smokeless tobacco: Never Used  . Tobacco comment: Significant Second-hand and only 3 months himself  Substance Use Topics  . Alcohol use: Not Currently    Alcohol/week: 0.0 standard drinks    Comment: rare  . Drug use: No    Comment: Reported history of cocaine abuse off and on      Allergies   Codeine and Hydrocodone-acetaminophen   Review of Systems Review of Systems All other systems  negative except as documented in the HPI. All pertinent positives and negatives as reviewed in the HPI.  Physical Exam Updated Vital Signs BP (!) 144/94 (BP Location: Left Arm)   Pulse 69   Temp 98.8 F (37.1 C) (Oral)   Resp 18   Wt 113.9 kg   SpO2 98%   BMI 31.80 kg/m   Physical Exam Vitals signs and nursing note reviewed.  Constitutional:      General: He is not in acute distress.    Appearance: He is well-developed.  HENT:     Head: Normocephalic and atraumatic.     Right Ear: Tympanic membrane normal.     Left Ear: Tympanic membrane normal.     Mouth/Throat:   Eyes:     Pupils: Pupils are equal, round, and reactive to light.  Neck:     Musculoskeletal: Normal range of motion and neck supple.  Cardiovascular:     Rate and Rhythm: Normal rate and regular rhythm.     Heart sounds: Normal heart sounds. No murmur. No friction rub. No gallop.   Pulmonary:     Effort: Pulmonary effort is normal. No respiratory distress.     Breath sounds: Normal breath sounds. No wheezing.  Abdominal:     General: Bowel sounds are normal. There is no distension.     Palpations: Abdomen is soft.     Tenderness: There is no abdominal tenderness.  Skin:    General: Skin is warm and dry.     Capillary Refill: Capillary refill takes less than 2 seconds.     Findings: No erythema or rash.  Neurological:     Mental Status: He is alert and oriented to person, place, and time.     Motor: No abnormal muscle tone.     Coordination: Coordination normal.  Psychiatric:        Behavior: Behavior normal.      ED Treatments / Results  Labs (all labs ordered are listed, but only abnormal results are displayed) Labs Reviewed - No data to display  EKG None  Radiology No results found.  Procedures Procedures (including critical care time)  Medications Ordered in ED Medications - No data to display   Initial Impression / Assessment and Plan / ED Course  I have reviewed the triage vital  signs and the nursing notes.  Pertinent labs & imaging results  that were available during my care of the patient were reviewed by me and considered in my medical decision making (see chart for details).     Patient will be referred to dentistry.  Will be treating him for dental infection along with a URI with cough.  Patient is advised to return here as needed.  Told to increase his fluid intake.  Patient had all questions answered.  Patient's ear pain is most likely associated with dental infection rather than an ear infection. Final Clinical Impressions(s) / ED Diagnoses   Final diagnoses:  None    ED Discharge Orders    None       Dalia Heading, PA-C 04/16/18 1200    Sherwood Gambler, MD 04/16/18 1555

## 2018-04-16 NOTE — Discharge Instructions (Signed)
Return here as needed.  Follow-up with the oral surgeon provided.  Follow-up with your doctor.

## 2018-04-16 NOTE — ED Notes (Signed)
Pt reports that he would like to take Tylenol or Motrin at home for elevated temp

## 2018-04-17 ENCOUNTER — Other Ambulatory Visit: Payer: Self-pay

## 2018-04-17 DIAGNOSIS — N184 Chronic kidney disease, stage 4 (severe): Secondary | ICD-10-CM

## 2018-04-30 ENCOUNTER — Telehealth: Payer: Self-pay | Admitting: Emergency Medicine

## 2018-04-30 ENCOUNTER — Ambulatory Visit (INDEPENDENT_AMBULATORY_CARE_PROVIDER_SITE_OTHER): Payer: Medicaid Other | Admitting: Emergency Medicine

## 2018-04-30 ENCOUNTER — Encounter: Payer: Self-pay | Admitting: Emergency Medicine

## 2018-04-30 VITALS — BP 130/82 | HR 48 | Ht 75.0 in | Wt 248.0 lb

## 2018-04-30 DIAGNOSIS — J449 Chronic obstructive pulmonary disease, unspecified: Secondary | ICD-10-CM

## 2018-04-30 DIAGNOSIS — J329 Chronic sinusitis, unspecified: Secondary | ICD-10-CM

## 2018-04-30 DIAGNOSIS — K047 Periapical abscess without sinus: Secondary | ICD-10-CM | POA: Diagnosis not present

## 2018-04-30 DIAGNOSIS — I480 Paroxysmal atrial fibrillation: Secondary | ICD-10-CM | POA: Diagnosis not present

## 2018-04-30 LAB — PULMONARY FUNCTION TEST
DL/VA % pred: 76 %
DL/VA: 3.72 ml/min/mmHg/L
DLCO unc % pred: 72 %
DLCO unc: 28.19 ml/min/mmHg
FEF 25-75 Post: 2.21 L/sec
FEF 25-75 Pre: 2.01 L/sec
FEF2575-%Change-Post: 10 %
FEF2575-%PRED-PRE: 54 %
FEF2575-%Pred-Post: 59 %
FEV1-%Change-Post: 3 %
FEV1-%Pred-Post: 73 %
FEV1-%Pred-Pre: 71 %
FEV1-PRE: 3.15 L
FEV1-Post: 3.26 L
FEV1FVC-%Change-Post: 1 %
FEV1FVC-%Pred-Pre: 84 %
FEV6-%Change-Post: 1 %
FEV6-%Pred-Post: 87 %
FEV6-%Pred-Pre: 85 %
FEV6-Post: 4.87 L
FEV6-Pre: 4.78 L
FEV6FVC-%Change-Post: 0 %
FEV6FVC-%Pred-Post: 102 %
FEV6FVC-%Pred-Pre: 102 %
FVC-%Change-Post: 2 %
FVC-%PRED-PRE: 83 %
FVC-%Pred-Post: 85 %
FVC-POST: 4.96 L
FVC-Pre: 4.85 L
Post FEV1/FVC ratio: 66 %
Post FEV6/FVC ratio: 98 %
Pre FEV1/FVC ratio: 65 %
Pre FEV6/FVC Ratio: 99 %

## 2018-04-30 MED ORDER — AMOXICILLIN-POT CLAVULANATE 875-125 MG PO TABS: 1.0000 | ORAL_TABLET | Freq: Two times a day (BID) | ORAL | 0 refills | Status: DC

## 2018-04-30 NOTE — Assessment & Plan Note (Signed)
There is decay and apparent abscess without a true socket at his remaining left upper molar.  He had improvement in his pain when he was treated with antibiotics in the emergency department 2 weeks ago.  He now continues to experience foul odor mainly on the left, purulent nasal drainage.  Question whether there may be sinus involvement.  He needs a dental surgery referral and we will arrange for this today.  I will treat him with 2 more weeks of Augmentin and do a CT scan of his sinuses to see if these are involved.  If so we may need to get an ENT referral also.  I will have him follow-up here with APP to review the results and dental recommendations.

## 2018-04-30 NOTE — Addendum Note (Signed)
Addended by: Valerie Salts on: 04/30/2018 02:28 PM   Modules accepted: Orders

## 2018-04-30 NOTE — Telephone Encounter (Signed)
Called and spoke with patient, he stated that the prescription is now ready for pick up. Nothing further needed. This was a mistake. Will close.

## 2018-04-30 NOTE — Patient Instructions (Signed)
Your pulmonary function testing is slightly improved compared with 05/13/2016.  This is good news. We will perform a chest x-ray to compare with your prior since you are on amiodarone Please continue Symbicort 2 puffs twice daily.  Remember to rinse and gargle after using this medication. Keep albuterol available to use 2 puffs up to every 4 hours if needed for shortness of breath, chest tightness, wheezing.  Please take Augmentin 875 mg twice a day for the next 2 weeks. We will perform a CT scan of your sinuses We will refer you to dental surgery to better evaluate your dental abscess, consider extraction. Follow with APP in 2 to 3 weeks to review your scan, dental recommendations. Follow with Dr Lamonte Sakai in 6 months or sooner if you have any problems

## 2018-04-30 NOTE — Addendum Note (Signed)
Addended by: Valerie Salts on: 04/30/2018 05:09 PM   Modules accepted: Orders

## 2018-04-30 NOTE — Progress Notes (Signed)
PFT done today. 

## 2018-04-30 NOTE — Assessment & Plan Note (Signed)
Stable clinically and by pulmonary function testing.  I will continue his Symbicort as he is using it, albuterol as needed.

## 2018-04-30 NOTE — Progress Notes (Signed)
Subjective:    Patient ID: Cody Ortega, male    DOB: 1960/05/23, 57 y.o.   MRN: [id number]  HPI 57 year old former smoker (pack years) with a history of malignant hypertension, atrial fibrillation on amiodarone,Nonischemic cardiomyopathy with an AICD, stage III chronic kidney disease.  He has been followed here by Dr. Ashok Cordia and more recently by T Parrett for moderate COPD with some asthmatic features, seasonal allergies, mild GERD.  He had pulmonary function testing performed1/04/2017 that I have reviewed.  This shows moderately severe obstruction without a bronchodilator response, normal lung volumes, mildly decreased diffusion capacity that corrects to the normal range when adjusted for his alveolar volume.  He is currently managed with Symbicort.  His last chest x-ray was 07/26/2016 which I reviewed and which was grossly normal. He reports that he has been doing fairly well, has lost some weight. He has had some increased difficulty with nasal congestion, PND, some worsening of breathing with the pollen. He uses nasonex qd. He is coughing a bit less. Uses albuterol 1-2x a week.   ROV 04/30/18 --this is a follow-up visit for COPD, seasonal allergies.  He also has a history of amiodarone use.  We repeated his pulmonary function testing today which I have reviewed.  This shows moderate obstruction with an FEV1 of 3.15 L (71% predicted), decreased residual volume, decreased diffusion capacity that does not fully correct when adjusted for his alveolar volume.  Compared with prior pulmonary function testing 05/13/2016 there is been significant improvement. He has been dealing with either a sinus infection or tooth abscess, has been treated with abx since 12/16. Pain better but he is still blowing purulent mucous from his nose. He hasn't seen dentistry yet. He has albuterol, uses rarely. Uses Symbicort reliably.     Review of Systems  Past Medical History:  Diagnosis Date  . Anxiety   . Arthritis     . Benzodiazepine dependence ([place])   . Bronchial asthma   . CAD (coronary artery disease)    a. Cath 03/2010: mod RCA stenosis, severe diag stenosis, treated medically given lack of angina.  . 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 Chronic systolic congestive heart failure (HCC)    Acute decompensation, LVEF less than 20%  . CKD (chronic kidney disease), stage IV (HCC)    Stage 3-4  . Depression   . Diverticulosis   . Hepatitis B   . History of cocaine abuse ([place])   . Hypertension   . Microcytic anemia   . PAF (paroxysmal atrial fibrillation) ([place])    a. Episode in 2008 with spontaneous conversion  . S/P implantation of automatic cardioverter/defibrillator (AICD)    a. Medtronic, implanted 2012.  . Sleep apnea   . Stroke [facility]) 2004  . Thyroid disease      Family History  Problem Relation Age of Onset  . Breast cancer Mother   . Multiple myeloma Mother   . Prostate cancer Father   . Diabetes Father   . Peripheral vascular disease Father   . Cerebral palsy Daughter   . Lung disease Neg Hx   . Colon cancer Neg Hx   . Stomach cancer Neg Hx   . Esophageal cancer Neg Hx   . Colon polyps Neg Hx      Social History   Socioeconomic History  . Marital status: Divorced    Spouse name: Not on file  . Number of children: 2  . Years of education: Not on file  .  Highest education level: Not on file  Occupational History  . 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 Financial resource strain: Not on file  . Food insecurity:    Worry: Not on file    Inability: Not on file  . Transportation needs:    Medical: Not on file    Non-medical: Not on file  Tobacco Use  . Smoking status: Former Smoker    Packs/day: 0.50    Years: 0.50    Pack years: 0.25    Types: Cigarettes    Last attempt to quit: 05/02/2010    Years since quitting: 8.0  . Smokeless tobacco: Never Used  . Tobacco comment: Significant  Second-hand and only 3 months himself  Substance and Sexual Activity  . Alcohol use: Not Currently    Alcohol/week: 0.0 standard drinks    Comment: rare  . Drug use: No    Comment: Reported history of cocaine abuse off and on   . Sexual activity: Not on file  Lifestyle  . Physical activity:    Days per week: Not on file    Minutes per session: Not on file  . Stress: Not on file  Relationships  . Social connections:    Talks on phone: Not on file    Gets together: Not on file    Attends religious service: Not on file    Active member of club or organization: Not on file    Attends meetings of clubs or organizations: Not on file    Relationship status: Not on file  . Intimate partner violence:    Fear of current or ex partner: Not on file    Emotionally abused: Not on file    Physically abused: Not on file    Forced sexual activity: Not on file  Other Topics Concern  . Not on file  Social History Narrative   Living with a son who is a 82 year old.  He is not     working, unemployed for 3 years.  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  Denied use of alcohol.  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       [facility]:   Originally from [place], [place]. He grew up in [place]. He moved to [place] in 1985. 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 No pets currently. No bird exposure. He did have mold in a previous home. No hot tub exposure.        Lived in [place], [place].  No significant inhaled exposures.   Allergies  Allergen Reactions  . Codeine Itching, Nausea And Vomiting and Hives  . Hydrocodone-Acetaminophen Itching and Nausea And Vomiting     Outpatient Medications Prior to Visit  Medication Sig Dispense Refill  . albuterol (PROAIR HFA) 108 (90 Base) MCG/ACT inhaler Inhale 2 puffs into the lungs every 6 (six) hours as needed for wheezing or shortness of breath. 1 Inhaler 2  . alprazolam (XANAX) 2  MG tablet Take 2 mg by mouth every 6 (six) hours as needed for anxiety.     Marland Kitchen amiodarone (PACERONE) 200 MG tablet TAKE 1 TABLET(200 MG) BY MOUTH DAILY 30 tablet 5  . amLODipine (NORVASC) 10 MG tablet TAKE 1 TABLET BY MOUTH EVERY DAY (Patient taking differently: Take 10 mg by mouth daily. ) 30 tablet 5  .  aspirin 81 MG EC tablet Take 81 mg by mouth at bedtime.     . carvedilol (COREG) 12.5 MG tablet TAKE 1 TABLET(12.5 MG) BY MOUTH TWICE DAILY WITH A MEAL 180 tablet 3  . cycloSPORINE (RESTASIS) 0.05 % ophthalmic emulsion Place 1 drop into both eyes 2 (two) times daily.    . DULoxetine (CYMBALTA) 60 MG capsule Take 60 mg by mouth daily.    Marland Kitchen entecavir (BARACLUDE) 0.5 MG tablet Take 1 tablet (0.5 mg total) by mouth every other day. Please note: Every OTHER day 15 tablet 5  . ferrous sulfate 325 (65 FE) MG tablet Take 325 mg by mouth daily with breakfast.     . hydrocortisone (ANUSOL-HC) 25 MG suppository Place 1 suppository (25 mg total) rectally 2 (two) times daily. 12 suppository 1  . mometasone (NASONEX) 50 MCG/ACT nasal spray Place 2 sprays into the nose daily. 17 g 5  . Olopatadine HCl (PAZEO) 0.7 % SOLN Place 1 drop into both eyes daily.    . ondansetron (ZOFRAN) 4 MG tablet Take 4 mg by mouth 2 (two) times daily as needed for nausea or vomiting.   0  . potassium chloride 20 MEQ/15ML (10%) SOLN Take 30 mLs (40 mEq total) by mouth daily. 900 mL 6  . Rivaroxaban (XARELTO) 15 MG TABS tablet Take 1 tablet (15 mg total) by mouth daily with supper. 90 tablet 3  . sildenafil (REVATIO) 20 MG tablet TAKE 2-5 TABLETS BY MOUTH DAILY AS NEEDED PRIOR TO SEXUAL ACTIVITY (Patient taking differently: Take 40-100 mg by mouth daily as needed (erectile dysfunction). ) 180 tablet 3  . Spacer/Aero-Holding Chambers (AEROCHAMBER MV) inhaler Use as instructed 1 each 0  . SYMBICORT 160-4.5 MCG/ACT inhaler INHALE 2 PUFFS INTO THE LUNGS TWICE DAILY 10.2 g 3  . Guaifenesin 1200 MG TB12 Take 1 tablet (1,200 mg total) by  mouth 2 (two) times daily. 20 each 0  . penicillin v potassium (VEETID) 500 MG tablet Take 1 tablet (500 mg total) by mouth 4 (four) times daily. 28 tablet 0  . predniSONE (DELTASONE) 50 MG tablet Take 1 tablet (50 mg total) by mouth daily. 5 tablet 0  . traMADol (ULTRAM) 50 MG tablet Take 1 tablet (50 mg total) by mouth every 6 (six) hours as needed for severe pain. 15 tablet 0   No facility-administered medications prior to visit.          Objective:   Physical Exam Vitals:   04/30/18 1134  BP: 130/82  Pulse: (!) 48  SpO2: 99%  Weight: 248 lb (112.5 kg)  Height: '6\' 3"'$  (1.905 m)   Gen: Pleasant, overwt man, in no distress,  normal affect  ENT: No lesions, left upper molar is decayed with some evidence of hypopigmented underlying gum without any drainage, without a socket.  Neck: No JVD, no stridor  Lungs: No use of accessory muscles, scattered rhonchi bi;laterally  Cardiovascular: RRR, heart sounds normal, no murmur or gallops, no peripheral edema  Musculoskeletal: No deformities, no cyanosis or clubbing  Neuro: alert, non focal  Skin: Warm, no lesions or rash     Assessment & Plan:  COPD with asthma ([place]) Stable clinically and by pulmonary function testing.  I will continue his Symbicort as he is using it, albuterol as needed.  Atrial fibrillation On amiodarone.  He had pulmonary function testing today that showed stable spirometry (actually improved), normal lung volumes and stable diffusion capacity.  He needs a chest x-ray to ensure no evolving infiltrates but  overall I do not see any evidence for any complication of amiodarone.  We will continue surveillance annually.  Dental abscess There is decay and apparent abscess without a true socket at his remaining left upper molar.  He had improvement in his pain when he was treated with antibiotics in the emergency department 2 weeks ago.  He now continues to experience foul odor mainly on the left, purulent nasal  drainage.  Question whether there may be sinus involvement.  He needs a dental surgery referral and we will arrange for this today.  I will treat him with 2 more weeks of Augmentin and do a CT scan of his sinuses to see if these are involved.  If so we may need to get an ENT referral also.  I will have him follow-up here with APP to review the results and dental recommendations.  Baltazar Apo, MD, PhD 04/30/2018, 1:47 PM [facility] [phone] or if no answer [phone]

## 2018-04-30 NOTE — Assessment & Plan Note (Signed)
On amiodarone.  He had pulmonary function testing today that showed stable spirometry (actually improved), normal lung volumes and stable diffusion capacity.  He needs a chest x-ray to ensure no evolving infiltrates but overall I do not see any evidence for any complication of amiodarone.  We will continue surveillance annually.

## 2018-05-02 HISTORY — PX: OTHER SURGICAL HISTORY: SHX169

## 2018-05-06 ENCOUNTER — Other Ambulatory Visit: Payer: Self-pay | Admitting: Cardiovascular Disease

## 2018-05-15 ENCOUNTER — Ambulatory Visit (INDEPENDENT_AMBULATORY_CARE_PROVIDER_SITE_OTHER)
Admission: RE | Admit: 2018-05-15 | Discharge: 2018-05-15 | Disposition: A | Discharge status: Home / Self Care | Payer: Medicaid Other | Source: Ambulatory Visit | Attending: Emergency Medicine | Admitting: Emergency Medicine

## 2018-05-15 ENCOUNTER — Other Ambulatory Visit: Payer: Self-pay | Admitting: Emergency Medicine

## 2018-05-15 DIAGNOSIS — J320 Chronic maxillary sinusitis: Secondary | ICD-10-CM

## 2018-05-15 DIAGNOSIS — J329 Chronic sinusitis, unspecified: Secondary | ICD-10-CM | POA: Diagnosis not present

## 2018-05-18 DIAGNOSIS — H2512 Age-related nuclear cataract, left eye: Secondary | ICD-10-CM | POA: Diagnosis not present

## 2018-05-18 DIAGNOSIS — H2511 Age-related nuclear cataract, right eye: Secondary | ICD-10-CM | POA: Diagnosis not present

## 2018-05-22 ENCOUNTER — Other Ambulatory Visit: Payer: Self-pay | Admitting: Cardiovascular Disease

## 2018-05-22 DIAGNOSIS — R059 Cough, unspecified: Secondary | ICD-10-CM

## 2018-05-22 DIAGNOSIS — R05 Cough: Secondary | ICD-10-CM

## 2018-05-22 DIAGNOSIS — R0602 Shortness of breath: Secondary | ICD-10-CM

## 2018-05-22 DIAGNOSIS — I5022 Chronic systolic (congestive) heart failure: Secondary | ICD-10-CM

## 2018-05-24 DIAGNOSIS — F330 Major depressive disorder, recurrent, mild: Secondary | ICD-10-CM | POA: Diagnosis not present

## 2018-05-24 DIAGNOSIS — F410 Panic disorder [episodic paroxysmal anxiety] without agoraphobia: Secondary | ICD-10-CM | POA: Diagnosis not present

## 2018-05-25 DIAGNOSIS — H40033 Anatomical narrow angle, bilateral: Secondary | ICD-10-CM | POA: Diagnosis not present

## 2018-05-25 DIAGNOSIS — H2513 Age-related nuclear cataract, bilateral: Secondary | ICD-10-CM | POA: Diagnosis not present

## 2018-05-28 DIAGNOSIS — Z7289 Other problems related to lifestyle: Secondary | ICD-10-CM | POA: Diagnosis not present

## 2018-05-28 DIAGNOSIS — K08409 Partial loss of teeth, unspecified cause, unspecified class: Secondary | ICD-10-CM | POA: Diagnosis not present

## 2018-05-28 DIAGNOSIS — J0100 Acute maxillary sinusitis, unspecified: Secondary | ICD-10-CM | POA: Diagnosis not present

## 2018-05-28 DIAGNOSIS — J343 Hypertrophy of nasal turbinates: Secondary | ICD-10-CM | POA: Diagnosis not present

## 2018-05-28 DIAGNOSIS — K089 Disorder of teeth and supporting structures, unspecified: Secondary | ICD-10-CM | POA: Diagnosis not present

## 2018-05-28 DIAGNOSIS — Z87891 Personal history of nicotine dependence: Secondary | ICD-10-CM | POA: Diagnosis not present

## 2018-05-28 DIAGNOSIS — J342 Deviated nasal septum: Secondary | ICD-10-CM | POA: Diagnosis not present

## 2018-06-08 DIAGNOSIS — H2512 Age-related nuclear cataract, left eye: Secondary | ICD-10-CM | POA: Diagnosis not present

## 2018-06-12 ENCOUNTER — Telehealth: Payer: Self-pay

## 2018-06-12 ENCOUNTER — Encounter: Payer: Self-pay | Admitting: Internal Medicine

## 2018-06-12 ENCOUNTER — Ambulatory Visit: Payer: Medicaid Other | Attending: Internal Medicine | Admitting: Internal Medicine

## 2018-06-12 VITALS — BP 129/74 | HR 57 | Temp 98.6°F | Resp 16 | Ht 75.0 in | Wt 256.2 lb

## 2018-06-12 DIAGNOSIS — Z6830 Body mass index (BMI) 30.0-30.9, adult: Secondary | ICD-10-CM | POA: Insufficient documentation

## 2018-06-12 DIAGNOSIS — I480 Paroxysmal atrial fibrillation: Secondary | ICD-10-CM | POA: Insufficient documentation

## 2018-06-12 DIAGNOSIS — J449 Chronic obstructive pulmonary disease, unspecified: Secondary | ICD-10-CM | POA: Diagnosis not present

## 2018-06-12 DIAGNOSIS — B181 Chronic viral hepatitis B without delta-agent: Secondary | ICD-10-CM | POA: Diagnosis not present

## 2018-06-12 DIAGNOSIS — I130 Hypertensive heart and chronic kidney disease with heart failure and stage 1 through stage 4 chronic kidney disease, or unspecified chronic kidney disease: Secondary | ICD-10-CM | POA: Insufficient documentation

## 2018-06-12 DIAGNOSIS — Z8249 Family history of ischemic heart disease and other diseases of the circulatory system: Secondary | ICD-10-CM | POA: Diagnosis not present

## 2018-06-12 DIAGNOSIS — Z7901 Long term (current) use of anticoagulants: Secondary | ICD-10-CM | POA: Insufficient documentation

## 2018-06-12 DIAGNOSIS — K219 Gastro-esophageal reflux disease without esophagitis: Secondary | ICD-10-CM | POA: Diagnosis not present

## 2018-06-12 DIAGNOSIS — N184 Chronic kidney disease, stage 4 (severe): Secondary | ICD-10-CM | POA: Insufficient documentation

## 2018-06-12 DIAGNOSIS — I5022 Chronic systolic (congestive) heart failure: Secondary | ICD-10-CM | POA: Diagnosis not present

## 2018-06-12 DIAGNOSIS — K029 Dental caries, unspecified: Secondary | ICD-10-CM | POA: Diagnosis not present

## 2018-06-12 DIAGNOSIS — E7800 Pure hypercholesterolemia, unspecified: Secondary | ICD-10-CM | POA: Insufficient documentation

## 2018-06-12 DIAGNOSIS — Z7982 Long term (current) use of aspirin: Secondary | ICD-10-CM | POA: Diagnosis not present

## 2018-06-12 DIAGNOSIS — E669 Obesity, unspecified: Secondary | ICD-10-CM | POA: Diagnosis not present

## 2018-06-12 DIAGNOSIS — E785 Hyperlipidemia, unspecified: Secondary | ICD-10-CM | POA: Diagnosis not present

## 2018-06-12 DIAGNOSIS — Z87891 Personal history of nicotine dependence: Secondary | ICD-10-CM | POA: Diagnosis not present

## 2018-06-12 DIAGNOSIS — R200 Anesthesia of skin: Secondary | ICD-10-CM | POA: Diagnosis not present

## 2018-06-12 DIAGNOSIS — Z95810 Presence of automatic (implantable) cardiac defibrillator: Secondary | ICD-10-CM | POA: Diagnosis not present

## 2018-06-12 DIAGNOSIS — I2510 Atherosclerotic heart disease of native coronary artery without angina pectoris: Secondary | ICD-10-CM | POA: Diagnosis not present

## 2018-06-12 DIAGNOSIS — N183 Chronic kidney disease, stage 3 unspecified: Secondary | ICD-10-CM

## 2018-06-12 DIAGNOSIS — N529 Male erectile dysfunction, unspecified: Secondary | ICD-10-CM | POA: Insufficient documentation

## 2018-06-12 DIAGNOSIS — Z79899 Other long term (current) drug therapy: Secondary | ICD-10-CM | POA: Diagnosis not present

## 2018-06-12 DIAGNOSIS — Z6832 Body mass index (BMI) 32.0-32.9, adult: Secondary | ICD-10-CM

## 2018-06-12 DIAGNOSIS — I495 Sick sinus syndrome: Secondary | ICD-10-CM | POA: Diagnosis not present

## 2018-06-12 DIAGNOSIS — G894 Chronic pain syndrome: Secondary | ICD-10-CM | POA: Diagnosis not present

## 2018-06-12 NOTE — Patient Instructions (Signed)
Try to schedule an appointment with the dentist as soon as possible.  I have entered orders for blood test.  Please make sure until the lab tech to pull my orders when you going to have your blood work done at Dr. Doyne Keel.

## 2018-06-12 NOTE — Progress Notes (Signed)
Patient ID: Cody Ortega, male    DOB: 28-Dec-1960  MRN: [id number]  CC: Hypertension   Subjective: Cody Ortega is a 58 y.o. male who presents for chronic disease management. His concerns today include:  The patient with history of COPD and asthma, HTN, CKD stage III, CAD, andNICM with chronic systolic CHF and ICD, PAF,immune active hep B,adenomatous polyps, dep/anx(followed by psychiatrist, Noemi Chapel)  Hepatitis B: Since last visit with me he has followed up with gastroenterologist Dr. Havery Moros.  He was found to have a high viral load and elevated ALT which persisted.  Prior imaging suggest  cirrhosis.  Test for HIV and hep C were negative.  AFP normal.  He was started on treatment with Entecavir  COPD: Saw Dr. Lamonte Sakai in December.  Pulmonary function tests showed improved spirometry, normal lung volumes and diffusion capacity stable.  His COPD was assessed to be clinically stable.  Plan is to continue Symbicort and albuterol as needed. Dx with LT sinusitis.  Referred to ENT at [facility], Dr. Blenda Nicely.  Placed on and completed abx. Has a LT upper molar that has to be removed. He plans to schedule appt with dentist  CKD/HTN:  GFR stable Compliant with Norvasc and Coreg Stopped liq potassium 3 mths ago because not very palatable.  Last K+ level was 3.5.   C/o tip of numbness at the tip of both thumb x 3 mths.  Now started at tip of index fingers.  Painful in cold weather but has not noticed color changes.  "Feels like a frost bite."  CAD/CHF: No chest pains with exertion.  Some shortness of breath at times which he attributes to his COPD.  No PND orthopnea.  No lower extremity edema. He has not been exercising regularly because the pool at the gym has closed and will open again in May.  He plans to start swimming again once the pool reopens.  Plays volleyball 1 night a week. Patient Active Problem List   Diagnosis Date Noted  . Dental abscess 04/30/2018  . Adenomatous  polyp of colon 02/09/2018  . 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 Rectal bleeding   . Colon cancer screening   . Benign neoplasm of colon   . Osteoarthritis of both knees 06/12/2017  . COPD with asthma ([place]) 03/17/2016  . Chronic seasonal allergic rhinitis 03/17/2016  . GERD (gastroesophageal reflux disease) 03/17/2016  . Chronic kidney disease (CKD), stage IV (severe) ([place]) 03/17/2016  . Chronic pain syndrome 03/16/2015  . Obstructive sleep apnea 03/16/2015  . Chronic low back pain with right-sided sciatica 03/16/2015  . Sinus brady-tachy syndrome ([place]) 02/20/2014  . Atrial fibrillation ([place]) 12/31/2013  . Automatic implantable cardioverter-defibrillator in situ 03/08/2011  . Erectile dysfunction 02/28/2011  . CAD, NATIVE VESSEL 04/26/2010  . PURE HYPERCHOLESTEROLEMIA 04/12/2010  . HLD (hyperlipidemia) 01/11/2010  . Essential hypertension, malignant 12/18/2009  . Chronic systolic heart failure ([place]) 12/18/2009     Current Outpatient Medications on File Prior to Visit  Medication Sig Dispense Refill  . albuterol (PROAIR HFA) 108 (90 Base) MCG/ACT inhaler Inhale 2 puffs into the lungs every 6 (six) hours as needed for wheezing or shortness of breath. 1 Inhaler 2  . alprazolam (XANAX) 2 MG tablet Take 2 mg by mouth every 6 (six) hours as needed for anxiety.     Marland Kitchen amiodarone (PACERONE) 200 MG tablet TAKE 1 TABLET(200 MG) BY MOUTH DAILY 30 tablet 7  . amLODipine (NORVASC) 10 MG tablet  TAKE 1 TABLET BY MOUTH EVERY DAY 30 tablet 6  . aspirin 81 MG EC tablet Take 81 mg by mouth at bedtime.     . carvedilol (COREG) 12.5 MG tablet TAKE 1 TABLET(12.5 MG) BY MOUTH TWICE DAILY WITH A MEAL 180 tablet 3  . cycloSPORINE (RESTASIS) 0.05 % ophthalmic emulsion Place 1 drop into both eyes 2 (two) times daily.    . DULoxetine (CYMBALTA) 60 MG capsule Take 60 mg by mouth daily.    Marland Kitchen entecavir (BARACLUDE) 0.5 MG tablet Take 1 tablet (0.5 mg total) by mouth every other  day. Please note: Every OTHER day 15 tablet 5  . ferrous sulfate 325 (65 FE) MG tablet Take 325 mg by mouth daily with breakfast.     . hydrocortisone (ANUSOL-HC) 25 MG suppository Place 1 suppository (25 mg total) rectally 2 (two) times daily. 12 suppository 1  . mometasone (NASONEX) 50 MCG/ACT nasal spray Place 2 sprays into the nose daily. 17 g 5  . Olopatadine HCl (PAZEO) 0.7 % SOLN Place 1 drop into both eyes daily.    . ondansetron (ZOFRAN) 4 MG tablet Take 4 mg by mouth 2 (two) times daily as needed for nausea or vomiting.   0  . potassium chloride 20 MEQ/15ML (10%) SOLN Take 30 mLs (40 mEq total) by mouth daily. 900 mL 6  . Rivaroxaban (XARELTO) 15 MG TABS tablet Take 1 tablet (15 mg total) by mouth daily with supper. 90 tablet 3  . sildenafil (REVATIO) 20 MG tablet TAKE 2-5 TABLETS BY MOUTH DAILY AS NEEDED PRIOR TO SEXUAL ACTIVITY (Patient taking differently: Take 40-100 mg by mouth daily as needed (erectile dysfunction). ) 180 tablet 3  . Spacer/Aero-Holding Chambers (AEROCHAMBER MV) inhaler Use as instructed 1 each 0  . SYMBICORT 160-4.5 MCG/ACT inhaler INHALE 2 PUFFS INTO THE LUNGS TWICE DAILY 10.2 g 3   No current facility-administered medications on file prior to visit.     Allergies  Allergen Reactions  . Codeine Itching, Nausea And Vomiting and Hives  . Hydrocodone-Acetaminophen Itching and Nausea And Vomiting    Social History   Socioeconomic History  . Marital status: Divorced    Spouse name: Not on file  . Number of children: 2  . Years of education: Not on file  . Highest education level: Not on file  Occupational History  . 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 Financial resource strain: Not on file  . Food insecurity:    Worry: Not on file    Inability: Not on file  . Transportation needs:    Medical: Not on file    Non-medical: Not on file  Tobacco Use  . Smoking status: Former Smoker     Packs/day: 0.50    Years: 0.50    Pack years: 0.25    Types: Cigarettes    Last attempt to quit: 05/02/2010    Years since quitting: 8.1  . Smokeless tobacco: Never Used  . Tobacco comment: Significant Second-hand and only 3 months himself  Substance and Sexual Activity  . Alcohol use: Not Currently    Alcohol/week: 0.0 standard drinks    Comment: rare  . Drug use: No    Comment: Reported history of cocaine abuse off and on   . Sexual activity: Not on file  Lifestyle  . Physical activity:    Days per week: Not on file    Minutes per session: Not  on file  . Stress: Not on file  Relationships  . Social connections:    Talks on phone: Not on file    Gets together: Not on file    Attends religious service: Not on file    Active member of club or organization: Not on file    Attends meetings of clubs or organizations: Not on file    Relationship status: Not on file  . Intimate partner violence:    Fear of current or ex partner: Not on file    Emotionally abused: Not on file    Physically abused: Not on file    Forced sexual activity: Not on file  Other Topics Concern  . Not on file  Social History Narrative   Living with a son who is a 60 year old.  He is not     working, unemployed for 3 years.  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  Denied use of alcohol.  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       [facility]:   Originally from [place], [place]. He grew up in [place]. He moved to [place] in 1985. 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 No pets currently. No bird exposure. He did have mold in a previous home. No hot tub exposure.          Family History  Problem Relation Age of Onset  . Breast cancer Mother   . Multiple myeloma Mother   . Prostate cancer Father   . Diabetes Father   . Peripheral vascular disease Father   . Cerebral palsy Daughter   . Lung disease Neg Hx   . Colon  cancer Neg Hx   . Stomach cancer Neg Hx   . Esophageal cancer Neg Hx   . Colon polyps Neg Hx     Past Surgical History:  Procedure Laterality Date  . CARDIOVERSION N/A 01/07/2014   Procedure: CARDIOVERSION;  Surgeon: Lelon Perla, MD;  Location: [facility] ENDOSCOPY;  Service: Cardiovascular;  Laterality: N/A;  . CARDIOVERSION N/A 02/11/2014   Procedure: CARDIOVERSION;  Surgeon: Pixie Casino, MD;  Location: [place];  Service: Cardiovascular;  Laterality: N/A;  . COLONOSCOPY WITH PROPOFOL N/A 02/06/2018   Procedure: COLONOSCOPY WITH PROPOFOL;  Surgeon: Yetta Flock, MD;  Location: [facility] ENDOSCOPY;  Service: Gastroenterology;  Laterality: N/A;  . LASEK eye surgery Bilateral 05/2018  . PACEMAKER INSERTION    . POLYPECTOMY  02/06/2018   Procedure: POLYPECTOMY;  Surgeon: Yetta Flock, MD;  Location: [facility] ENDOSCOPY;  Service: Gastroenterology;;  . TEE WITHOUT CARDIOVERSION N/A 01/07/2014   Procedure: TRANSESOPHAGEAL ECHOCARDIOGRAM (TEE);  Surgeon: Lelon Perla, MD;  Location[facility] ENDOSCOPY;  Service: Cardiovascular;  Laterality: N/A;  . TEE WITHOUT CARDIOVERSION N/A 02/11/2014   Procedure: TRANSESOPHAGEAL ECHOCARDIOGRAM (TEE);  Surgeon: Pixie Casino, MD;  Location: [facility] ENDOSCOPY;  Service: Cardiovascular;  Laterality: N/A;  . TONSILLECTOMY    . TRANSTHORACIC ECHOCARDIOGRAM  10/2006, 12/2009    ROS: Review of Systems Negative except as stated above  PHYSICAL EXAM: BP 129/74   Pulse (!) 57   Temp 98.6 F (37 C) (Oral)   Resp 16   Ht '6\' 3"'$  (1.905 m)   Wt 256 lb 3.2 oz (116.2 kg)   SpO2 98%   BMI 32.02 kg/m   Wt Readings from Last 3 Encounters:  06/12/18 256 lb 3.2 oz (116.2 kg)  04/30/18 248  lb (112.5 kg)  04/16/18 251 lb (113.9 kg)    Physical Exam  General appearance - alert, well appearing, and in no distress Mental status - normal mood, behavior, speech, dress, motor activity, and thought processes Neck - supple, no significant adenopathy Mouth: Several  decayed tooth broken off in the gum noted Chest - clear to auscultation, no wheezes, rales or rhonchi, symmetric air entry Heart - normal rate, regular rhythm, normal S1, S2, no murmurs, rubs, clicks or gallops Neurological -grip 5/5 bilaterally.  Gross sensation intact in both upper extremities. Extremities -no LE edema  CMP Latest Ref Rng & Units 04/13/2018 03/07/2018 12/28/2017  Glucose 70 - 99 mg/dL 112(H) - 96  BUN 6 - 20 mg/dL 18 - 19  Creatinine 0.61 - 1.24 mg/dL 1.76(H) - 1.77(H)  Sodium 135 - 145 mmol/L 138 - 138  Potassium 3.5 - 5.1 mmol/L 3.5 - 3.8  Chloride 98 - 111 mmol/L 99 - 104  CO2 22 - 32 mmol/L 27 - 28  Calcium 8.9 - 10.3 mg/dL 9.5 - 8.8(L)  Total Protein 6.5 - 8.1 g/dL 8.5(H) 7.5 6.6  Total Bilirubin 0.3 - 1.2 mg/dL 1.0 0.5 0.9  Alkaline Phos 38 - 126 U/L 85 90 94  AST 15 - 41 U/L 74(H) 129(H) 84(H)  ALT 0 - 44 U/L 89(H) 113(H) 94(H)   Lipid Panel     Component Value Date/Time   CHOL 165 05/16/2017 1353   TRIG 100 05/16/2017 1353   HDL 48 05/16/2017 1353   CHOLHDL 3.4 05/16/2017 1353   CHOLHDL 3.9 10/12/2015 1026   VLDL 27 10/12/2015 1026   LDLCALC 97 05/16/2017 1353    CBC    Component Value Date/Time   WBC 8.8 05/30/2017 1430   WBC 14.3 (H) 05/20/2017 1143   RBC 5.09 05/30/2017 1430   RBC 5.19 05/20/2017 1143   HGB 16.1 05/30/2017 1430   HCT 46.5 05/30/2017 1430   PLT 277 05/30/2017 1430   MCV 91 05/30/2017 1430   MCH 31.6 05/30/2017 1430   MCH 33.3 05/20/2017 1143   MCHC 34.6 05/30/2017 1430   MCHC 36.0 05/20/2017 1143   RDW 13.8 05/30/2017 1430   LYMPHSABS 1.6 05/30/2017 1430   MONOABS 820 01/26/2016 1025   EOSABS 0.1 05/30/2017 1430   BASOSABS 0.0 05/30/2017 1430    ASSESSMENT AND PLAN: 1. Obesity (BMI 30-39.9) Encouraged him to exercise as tolerated at least 3 days a week.  Healthy eating habits discussed and encouraged - Hemoglobin A1c; Future  2. Dental caries Recommend that he makes an appointment as soon as possible with a  dentist.  3. Numbness of fingers - Vitamin B12; Future - Hemoglobin A1c; Future  4. Chronic systolic heart failure (HCC) Clinically stable and compensated.  He is followed by cardiology - CBC; Future  5. COPD with asthma ([place]) Controlled on current inhalers.  Followed by pulmonology  6. Paroxysmal atrial fibrillation (HCC) Stable on amiodarone, carvedilol and Xarelto.  7. 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 CKD (chronic kidney disease) stage 3, GFR 30-59 ml/min (HCC) Stable.  Advised to avoid NSAIDs    Patient was given the opportunity to ask questions.  Patient verbalized understanding of the plan and was able to repeat key elements of the plan.   Orders Placed This Encounter  Procedures  . CBC  . Vitamin B12  . Hemoglobin A1c     Requested Prescriptions    No  prescriptions requested or ordered in this encounter    Return in about 4 months (around 10/11/2018).  Karle Plumber, MD, FACP

## 2018-06-15 DIAGNOSIS — H16223 Keratoconjunctivitis sicca, not specified as Sjogren's, bilateral: Secondary | ICD-10-CM | POA: Diagnosis not present

## 2018-06-19 ENCOUNTER — Telehealth: Payer: Self-pay | Admitting: Gastroenterology

## 2018-06-19 DIAGNOSIS — K641 Second degree hemorrhoids: Secondary | ICD-10-CM | POA: Diagnosis not present

## 2018-06-19 NOTE — Telephone Encounter (Signed)
Pt requested to have order sent to his cardiologist at [facility].  Pt will have blood drawn today w PCP.

## 2018-06-19 NOTE — Telephone Encounter (Signed)
Patient requesting that I print a copy of the labs for him to take to cardiology

## 2018-06-20 ENCOUNTER — Other Ambulatory Visit: Payer: Self-pay | Admitting: *Deleted

## 2018-06-20 ENCOUNTER — Other Ambulatory Visit: Payer: Medicaid Other | Admitting: *Deleted

## 2018-06-20 DIAGNOSIS — N184 Chronic kidney disease, stage 4 (severe): Secondary | ICD-10-CM | POA: Diagnosis not present

## 2018-06-21 LAB — COMPREHENSIVE METABOLIC PANEL
ALBUMIN: 3.9 g/dL (ref 3.8–4.9)
ALT: 145 IU/L — ABNORMAL HIGH (ref 0–44)
AST: 111 IU/L — AB (ref 0–40)
Albumin/Globulin Ratio: 1 — ABNORMAL LOW (ref 1.2–2.2)
Alkaline Phosphatase: 120 IU/L — ABNORMAL HIGH (ref 39–117)
BUN/Creatinine Ratio: 16 (ref 9–20)
BUN: 24 mg/dL (ref 6–24)
Bilirubin Total: 0.5 mg/dL (ref 0.0–1.2)
CO2: 22 mmol/L (ref 20–29)
Calcium: 9.3 mg/dL (ref 8.7–10.2)
Chloride: 100 mmol/L (ref 96–106)
Creatinine, Ser: 1.5 mg/dL — ABNORMAL HIGH (ref 0.76–1.27)
GFR calc Af Amer: 60 mL/min/{1.73_m2} (ref >59)
GFR calc non Af Amer: 52 mL/min/{1.73_m2} — ABNORMAL LOW (ref >59)
Globulin, Total: 4 g/dL (ref 1.5–4.5)
Glucose: 90 mg/dL (ref 65–99)
POTASSIUM: 3.9 mmol/L (ref 3.5–5.2)
Sodium: 141 mmol/L (ref 134–144)
Total Protein: 7.9 g/dL (ref 6.0–8.5)

## 2018-06-21 LAB — HEPATITIS B DNA, ULTRAQUANTITATIVE, PCR
HBV DNA SERPL PCR-ACNC: 19100 IU/mL
HBV DNA SERPL PCR-LOG IU: 4.281 log10 IU/mL

## 2018-06-22 ENCOUNTER — Other Ambulatory Visit: Payer: Self-pay

## 2018-06-22 DIAGNOSIS — B181 Chronic viral hepatitis B without delta-agent: Secondary | ICD-10-CM

## 2018-06-27 NOTE — Telephone Encounter (Signed)
Opened in error

## 2018-06-28 ENCOUNTER — Ambulatory Visit (HOSPITAL_COMMUNITY)
Admission: RE | Admit: 2018-06-28 | Discharge: 2018-06-28 | Disposition: A | Discharge status: Home / Self Care | Payer: Medicaid Other | Source: Ambulatory Visit | Attending: Gastroenterology | Admitting: Gastroenterology

## 2018-06-28 DIAGNOSIS — B181 Chronic viral hepatitis B without delta-agent: Secondary | ICD-10-CM | POA: Insufficient documentation

## 2018-06-28 DIAGNOSIS — R932 Abnormal findings on diagnostic imaging of liver and biliary tract: Secondary | ICD-10-CM | POA: Insufficient documentation

## 2018-06-28 DIAGNOSIS — K7689 Other specified diseases of liver: Secondary | ICD-10-CM | POA: Diagnosis not present

## 2018-06-28 DIAGNOSIS — K649 Unspecified hemorrhoids: Secondary | ICD-10-CM | POA: Diagnosis not present

## 2018-06-29 ENCOUNTER — Other Ambulatory Visit: Payer: Self-pay

## 2018-06-29 DIAGNOSIS — K769 Liver disease, unspecified: Secondary | ICD-10-CM

## 2018-07-03 DIAGNOSIS — K641 Second degree hemorrhoids: Secondary | ICD-10-CM | POA: Diagnosis not present

## 2018-07-09 ENCOUNTER — Other Ambulatory Visit: Payer: Medicaid Other | Admitting: *Deleted

## 2018-07-09 ENCOUNTER — Ambulatory Visit: Payer: Medicaid Other | Admitting: Physician Assistant

## 2018-07-09 DIAGNOSIS — R748 Abnormal levels of other serum enzymes: Secondary | ICD-10-CM

## 2018-07-09 DIAGNOSIS — I5022 Chronic systolic (congestive) heart failure: Secondary | ICD-10-CM | POA: Diagnosis not present

## 2018-07-09 DIAGNOSIS — E669 Obesity, unspecified: Secondary | ICD-10-CM | POA: Diagnosis not present

## 2018-07-09 DIAGNOSIS — R200 Anesthesia of skin: Secondary | ICD-10-CM

## 2018-07-09 DIAGNOSIS — E782 Mixed hyperlipidemia: Secondary | ICD-10-CM | POA: Diagnosis not present

## 2018-07-09 DIAGNOSIS — I2510 Atherosclerotic heart disease of native coronary artery without angina pectoris: Secondary | ICD-10-CM | POA: Diagnosis not present

## 2018-07-09 DIAGNOSIS — I480 Paroxysmal atrial fibrillation: Secondary | ICD-10-CM | POA: Diagnosis not present

## 2018-07-10 LAB — COMPREHENSIVE METABOLIC PANEL
ALK PHOS: 97 IU/L (ref 39–117)
ALT: 51 IU/L — ABNORMAL HIGH (ref 0–44)
AST: 34 IU/L (ref 0–40)
Albumin/Globulin Ratio: 1.2 (ref 1.2–2.2)
Albumin: 4 g/dL (ref 3.8–4.9)
BUN / CREAT RATIO: 16 (ref 9–20)
BUN: 25 mg/dL — ABNORMAL HIGH (ref 6–24)
Bilirubin Total: 0.5 mg/dL (ref 0.0–1.2)
CO2: 23 mmol/L (ref 20–29)
Calcium: 9.4 mg/dL (ref 8.7–10.2)
Chloride: 100 mmol/L (ref 96–106)
Creatinine, Ser: 1.59 mg/dL — ABNORMAL HIGH (ref 0.76–1.27)
GFR calc Af Amer: 56 mL/min/{1.73_m2} — ABNORMAL LOW (ref >59)
GFR calc non Af Amer: 48 mL/min/{1.73_m2} — ABNORMAL LOW (ref >59)
Globulin, Total: 3.4 g/dL (ref 1.5–4.5)
Glucose: 113 mg/dL — ABNORMAL HIGH (ref 65–99)
Potassium: 3.6 mmol/L (ref 3.5–5.2)
Sodium: 139 mmol/L (ref 134–144)
Total Protein: 7.4 g/dL (ref 6.0–8.5)

## 2018-07-10 LAB — LIPID PANEL
CHOLESTEROL TOTAL: 180 mg/dL (ref 100–199)
Chol/HDL Ratio: 4.5 ratio (ref 0.0–5.0)
HDL: 40 mg/dL (ref >39)
LDL Calculated: 116 mg/dL — ABNORMAL HIGH (ref 0–99)
Triglycerides: 118 mg/dL (ref 0–149)
VLDL Cholesterol Cal: 24 mg/dL (ref 5–40)

## 2018-07-10 LAB — HEMOGLOBIN A1C
Est. average glucose Bld gHb Est-mCnc: 97 mg/dL
HEMOGLOBIN A1C: 5 % (ref 4.8–5.6)

## 2018-07-10 LAB — VITAMIN B12: Vitamin B-12: 696 pg/mL (ref 232–1245)

## 2018-07-10 LAB — CBC
Hematocrit: 47.9 % (ref 37.5–51.0)
Hemoglobin: 16.4 g/dL (ref 13.0–17.7)
MCH: 31.7 pg (ref 26.6–33.0)
MCHC: 34.2 g/dL (ref 31.5–35.7)
MCV: 93 fL (ref 79–97)
Platelets: 185 10*3/uL (ref 150–450)
RBC: 5.18 x10E6/uL (ref 4.14–5.80)
RDW: 13.5 % (ref 11.6–15.4)
WBC: 7 10*3/uL (ref 3.4–10.8)

## 2018-07-10 LAB — TSH: TSH: 1.45 u[IU]/mL (ref 0.450–4.500)

## 2018-07-11 ENCOUNTER — Ambulatory Visit: Payer: Medicaid Other | Admitting: Physician Assistant

## 2018-07-11 ENCOUNTER — Telehealth: Payer: Self-pay

## 2018-07-11 NOTE — Telephone Encounter (Signed)
-----   Message from Roetta Sessions, [place] sent at 01/02/2018 12:25 PM EDT ----- Regarding: pt due for 2nd and final Hep A inj Pt had 1st Hep A injection on Sept 23, 2019. Due for 2nd after July 23, 2018. Scheduled?

## 2018-07-11 NOTE — Telephone Encounter (Signed)
Called and Left detailed message for pt regarding need for 2nd and final Hep A vaccine needed on or within a few days after March 24th.  Scheduled for 3-24 at 9:30am.

## 2018-07-12 NOTE — Telephone Encounter (Signed)
Called and LM for pt to call back to confirm understanding and appt on 3-24 for Hep A injections.

## 2018-07-13 ENCOUNTER — Other Ambulatory Visit: Payer: Self-pay

## 2018-07-13 ENCOUNTER — Ambulatory Visit: Payer: Medicaid Other | Admitting: Internal Medicine

## 2018-07-13 ENCOUNTER — Telehealth: Payer: Self-pay

## 2018-07-13 ENCOUNTER — Encounter: Payer: Self-pay | Admitting: Internal Medicine

## 2018-07-13 VITALS — BP 136/88 | HR 65 | Ht 75.0 in | Wt 246.0 lb

## 2018-07-13 DIAGNOSIS — Z95810 Presence of automatic (implantable) cardiac defibrillator: Secondary | ICD-10-CM | POA: Diagnosis not present

## 2018-07-13 DIAGNOSIS — I5022 Chronic systolic (congestive) heart failure: Secondary | ICD-10-CM | POA: Diagnosis not present

## 2018-07-13 DIAGNOSIS — I480 Paroxysmal atrial fibrillation: Secondary | ICD-10-CM | POA: Diagnosis not present

## 2018-07-13 DIAGNOSIS — H1013 Acute atopic conjunctivitis, bilateral: Secondary | ICD-10-CM | POA: Diagnosis not present

## 2018-07-13 NOTE — Telephone Encounter (Signed)
Contacted pt to go over lab results pt didn't answer left as detailed vm informing pt of results and if he has any questions or concerns to give me a call

## 2018-07-13 NOTE — Progress Notes (Signed)
HPI Cody Ortega returns today for ongoing evaluation and management of his chronic systolic heart failure, PAF, obesity, s/p ICD insertion. In the interim he has done well. He denies chest pain or sob. He has lost weight by dieting. He denies palpitations. No ICD therapies. He continues to be bothered by ED. Allergies  Allergen Reactions  . Codeine Itching, Nausea And Vomiting and Hives  . Hydrocodone-Acetaminophen Itching and Nausea And Vomiting     Current Outpatient Medications  Medication Sig Dispense Refill  . albuterol (PROAIR HFA) 108 (90 Base) MCG/ACT inhaler Inhale 2 puffs into the lungs every 6 (six) hours as needed for wheezing or shortness of breath. 1 Inhaler 2  . alprazolam (XANAX) 2 MG tablet Take 2 mg by mouth every 6 (six) hours as needed for anxiety.     Marland Kitchen amiodarone (PACERONE) 200 MG tablet TAKE 1 TABLET(200 MG) BY MOUTH DAILY 30 tablet 7  . amLODipine (NORVASC) 10 MG tablet TAKE 1 TABLET BY MOUTH EVERY DAY 30 tablet 6  . aspirin 81 MG EC tablet Take 81 mg by mouth at bedtime.     . carvedilol (COREG) 12.5 MG tablet TAKE 1 TABLET(12.5 MG) BY MOUTH TWICE DAILY WITH A MEAL 180 tablet 3  . cycloSPORINE (RESTASIS) 0.05 % ophthalmic emulsion Place 1 drop into both eyes 2 (two) times daily.    . DULoxetine (CYMBALTA) 60 MG capsule Take 60 mg by mouth daily.    Marland Kitchen entecavir (BARACLUDE) 0.5 MG tablet Take 1 tablet (0.5 mg total) by mouth every other day. Please note: Every OTHER day 15 tablet 5  . ferrous sulfate 325 (65 FE) MG tablet Take 325 mg by mouth daily with breakfast.     . hydrocortisone (ANUSOL-HC) 25 MG suppository Place 1 suppository (25 mg total) rectally 2 (two) times daily. 12 suppository 1  . mometasone (NASONEX) 50 MCG/ACT nasal spray Place 2 sprays into the nose daily. 17 g 5  . Olopatadine HCl (PAZEO) 0.7 % SOLN Place 1 drop into both eyes daily.    . ondansetron (ZOFRAN) 4 MG tablet Take 4 mg by mouth 2 (two) times daily as needed for nausea or  vomiting.   0  . potassium chloride 20 MEQ/15ML (10%) SOLN Take 30 mLs (40 mEq total) by mouth daily. 900 mL 6  . Rivaroxaban (XARELTO) 15 MG TABS tablet Take 1 tablet (15 mg total) by mouth daily with supper. 90 tablet 3  . sildenafil (REVATIO) 20 MG tablet TAKE 2-5 TABLETS BY MOUTH DAILY AS NEEDED PRIOR TO SEXUAL ACTIVITY (Patient taking differently: Take 40-100 mg by mouth daily as needed (erectile dysfunction). ) 180 tablet 3  . Spacer/Aero-Holding Chambers (AEROCHAMBER MV) inhaler Use as instructed 1 each 0  . SYMBICORT 160-4.5 MCG/ACT inhaler INHALE 2 PUFFS INTO THE LUNGS TWICE DAILY 10.2 g 3   No current facility-administered medications for this visit.      Past Medical History:  Diagnosis Date  . Anxiety   . Arthritis   . Benzodiazepine dependence ([place])   . Bronchial asthma   . CAD (coronary artery disease)    a. Cath 03/2010: mod RCA stenosis, severe diag stenosis, treated medically given lack of angina.  . 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 Chronic systolic congestive heart failure (HCC)    Acute decompensation, LVEF less than 20%  . CKD (chronic kidney disease), stage IV (HCC)    Stage 3-4  . Depression   . Diverticulosis   .  Hepatitis B   . History of cocaine abuse ([place])   . Hypertension   . Microcytic anemia   . PAF (paroxysmal atrial fibrillation) ([place])    a. Episode in 2008 with spontaneous conversion  . S/P implantation of automatic cardioverter/defibrillator (AICD)    a. Medtronic, implanted 2012.  . Sleep apnea   . Stroke [facility]) 2004  . Thyroid disease     ROS:   All systems reviewed and negative except as noted in the HPI.   Past Surgical History:  Procedure Laterality Date  . CARDIOVERSION N/A 01/07/2014   Procedure: CARDIOVERSION;  Surgeon: Lelon Perla, MD;  Location: [facility] ENDOSCOPY;  Service: Cardiovascular;  Laterality: N/A;  . CARDIOVERSION N/A 02/11/2014   Procedure: CARDIOVERSION;  Surgeon: Pixie Casino, MD;  Location: [facility];  Service: Cardiovascular;  Laterality: N/A;  . COLONOSCOPY WITH PROPOFOL N/A 02/06/2018   Procedure: COLONOSCOPY WITH PROPOFOL;  Surgeon: Yetta Flock, MD;  Location: [facility] ENDOSCOPY;  Service: Gastroenterology;  Laterality: N/A;  . LASEK eye surgery Bilateral 05/2018  . PACEMAKER INSERTION    . POLYPECTOMY  02/06/2018   Procedure: POLYPECTOMY;  Surgeon: Yetta Flock, MD;  Location: [facility] ENDOSCOPY;  Service: Gastroenterology;;  . TEE WITHOUT CARDIOVERSION N/A 01/07/2014   Procedure: TRANSESOPHAGEAL ECHOCARDIOGRAM (TEE);  Surgeon: Lelon Perla, MD;  Location: [facility] ENDOSCOPY;  Service: Cardiovascular;  Laterality: N/A;  . TEE WITHOUT CARDIOVERSION N/A 02/11/2014   Procedure: TRANSESOPHAGEAL ECHOCARDIOGRAM (TEE);  Surgeon: Pixie Casino, MD;  Location: [facility] ENDOSCOPY;  Service: Cardiovascular;  Laterality: N/A;  . TONSILLECTOMY    . TRANSTHORACIC ECHOCARDIOGRAM  10/2006, 12/2009     Family History  Problem Relation Age of Onset  . Breast cancer Mother   . Multiple myeloma Mother   . Prostate cancer Father   . Diabetes Father   . Peripheral vascular disease Father   . Cerebral palsy Daughter   . Lung disease Neg Hx   . Colon cancer Neg Hx   . Stomach cancer Neg Hx   . Esophageal cancer Neg Hx   . Colon polyps Neg Hx      Social History   Socioeconomic History  . Marital status: Divorced    Spouse name: Not on file  . Number of children: 2  . Years of education: Not on file  . Highest education level: Not on file  Occupational History  . 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 Financial resource strain: Not on file  . Food insecurity:    Worry: Not on file    Inability: Not on file  . Transportation needs:    Medical: Not on file    Non-medical: Not on file  Tobacco Use  . Smoking status: Former Smoker    Packs/day: 0.50    Years: 0.50    Pack years: 0.25    Types: Cigarettes     Last attempt to quit: 05/02/2010    Years since quitting: 8.2  . Smokeless tobacco: Never Used  . Tobacco comment: Significant Second-hand and only 3 months himself  Substance and Sexual Activity  . Alcohol use: Not Currently    Alcohol/week: 0.0 standard drinks    Comment: rare  . Drug use: No    Comment: Reported history of cocaine abuse off and on   . Sexual activity: Not on file  Lifestyle  . Physical activity:    Days per week: Not on file  Minutes per session: Not on file  . Stress: Not on file  Relationships  . Social connections:    Talks on phone: Not on file    Gets together: Not on file    Attends religious service: Not on file    Active member of club or organization: Not on file    Attends meetings of clubs or organizations: Not on file    Relationship status: Not on file  . Intimate partner violence:    Fear of current or ex partner: Not on file    Emotionally abused: Not on file    Physically abused: Not on file    Forced sexual activity: Not on file  Other Topics Concern  . Not on file  Social History Narrative   Living with a son who is a 59 year old.  He is not     working, unemployed for 3 years.  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  Denied use of alcohol.  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       [facility]:   Originally from [place], [place]. He grew up in [place]. He moved to [place] in 1985. 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 No pets currently. No bird exposure. He did have mold in a previous home. No hot tub exposure.           BP 136/88   Pulse 65   Ht '6\' 3"'$  (1.905 m)   Wt 246 lb (111.6 kg)   SpO2 98%   BMI 30.75 kg/m   Physical Exam:  Well appearing NAD HEENT: Unremarkable Neck:  No JVD, no thyromegally Lymphatics:  No adenopathy Back:  No CVA tenderness Lungs:  Clear with no wheezesHEART:  Regular rate rhythm, no murmurs, no rubs, no  clicks Abd:  soft, positive bowel sounds, no organomegally, no rebound, no guarding Ext:  2 plus pulses, no edema, no cyanosis, no clubbing Skin:  No rashes no nodules Neuro:  CN II through XII intact, motor grossly intact  EKG - NSR with ILBBB  DEVICE  Normal device function.  See PaceArt for details.   Assess/Plan: 1. Chronic systolic heart failure - his symptoms are class 2. He will continue his current meds. 2. PAF - he is maintaining NSR on low dose amiodarone. 3. ICD- his medtronic ICD is working normally. No intercurrent VT/VF. 4. Obesity - he has lost 50 lbs. I encouraged him to lose more.  Cody Ortega.D.

## 2018-07-13 NOTE — Patient Instructions (Signed)
Medication Instructions:  Your physician recommends that you continue on your current medications as directed. Please refer to the Current Medication list given to you today.  Labwork: None ordered.  Testing/Procedures: None ordered.  Follow-Up:  Your physician wants you to follow-up in: 3 months with device clinic for a device check.  Your physician wants you to follow-up in: one year with Dr. Lovena Le.   You will receive a reminder letter in the mail two months in advance. If you don't receive a letter, please call our office to schedule the follow-up appointment.  Any Other Special Instructions Will Be Listed Below (If Applicable).  If you need a refill on your cardiac medications before your next appointment, please call your pharmacy.

## 2018-07-17 ENCOUNTER — Encounter: Payer: Medicaid Other | Admitting: Physician Assistant

## 2018-07-17 LAB — CUP PACEART INCLINIC DEVICE CHECK
Date Time Interrogation Session: 20200317093651
Implantable Lead Implant Date: 20120806
Implantable Lead Location: 753860
Implantable Pulse Generator Implant Date: 20120806
MDC IDC LEAD SERIAL: 345870

## 2018-07-18 NOTE — Telephone Encounter (Signed)
Left message for pt regarding Hep A injection for next week.

## 2018-07-20 ENCOUNTER — Telehealth: Payer: Self-pay | Admitting: Cardiovascular Disease

## 2018-07-20 NOTE — Telephone Encounter (Signed)
Per review of Pt chart, appears his intermal medicine doctor is calling him with results-not our office.  No action needed.

## 2018-07-20 NOTE — Telephone Encounter (Signed)
New message ° ° °Patient is returning call for lab results. °

## 2018-07-23 DIAGNOSIS — F410 Panic disorder [episodic paroxysmal anxiety] without agoraphobia: Secondary | ICD-10-CM | POA: Diagnosis not present

## 2018-07-23 DIAGNOSIS — F330 Major depressive disorder, recurrent, mild: Secondary | ICD-10-CM | POA: Diagnosis not present

## 2018-07-23 NOTE — Telephone Encounter (Signed)
Called and spoke to pt. Cancelled his nurse visit for Hep A vaccine for tomorrow, 3-24 due to COVID 19.  Told pt we will reassess the situation in 30 days and will reach out regarding rescheduling when we are seeing pts in the office again.

## 2018-08-13 DIAGNOSIS — K641 Second degree hemorrhoids: Secondary | ICD-10-CM | POA: Diagnosis not present

## 2018-08-16 ENCOUNTER — Other Ambulatory Visit: Payer: Self-pay | Admitting: Internal Medicine

## 2018-08-30 ENCOUNTER — Other Ambulatory Visit: Payer: Self-pay

## 2018-08-30 ENCOUNTER — Encounter: Payer: Medicaid Other | Admitting: *Deleted

## 2018-09-04 ENCOUNTER — Encounter: Payer: Self-pay | Admitting: Gastroenterology

## 2018-09-05 ENCOUNTER — Telehealth: Payer: Self-pay

## 2018-09-05 DIAGNOSIS — E782 Mixed hyperlipidemia: Secondary | ICD-10-CM

## 2018-09-05 MED ORDER — HYDRALAZINE HCL 25 MG PO TABS: 25.0000 mg | ORAL_TABLET | Freq: Three times a day (TID) | ORAL | 11 refills | Status: DC

## 2018-09-05 MED ORDER — EZETIMIBE 10 MG PO TABS: 10.0000 mg | ORAL_TABLET | Freq: Every day | ORAL | 11 refills | Status: DC

## 2018-09-05 NOTE — Telephone Encounter (Signed)
-----   Message from [name], MD sent at 07/15/2018 12:33 PM EDT ----- Lab reviewed. LFT's much better. Renal function stable. Considering his need for amiodarone and hx of chronically elevated LFT's I think we need to keep him off of statin Rx. OK to add Zetia 10 mg daily. Repeat LFT's 3 months

## 2018-09-05 NOTE — Telephone Encounter (Signed)
Cody Ortega reports his BP has been elevated for the last month or so. He checks his BP randomly throughout the day and it has been as high as 180/98 but averages about 154/90. He occasionally has a mild headache but isn't sure if it's due to HTN or some "eye issues" he;s having evaluated. He reports he has gained about 10 pounds during quarantine and he is stressed as well.   He would like to know if Dr. Burt Knack would like him to restart hydralazine (25 mg TID was d/c'd at 12/2017 visit).   He understands he'll be called with medication instructions.

## 2018-09-05 NOTE — Telephone Encounter (Signed)
Per patient request, left detailed message on VM to: 1) RESTART HYDRALAZINE 25 mg TID 2) Start Zetia 10 mg daily (this was discussed previously) 3) Call back to arrange follow-up LFTs in August.

## 2018-09-05 NOTE — Telephone Encounter (Signed)
Yes I think this is a good idea. Would restart hydralazine 25 mg TID. thanks

## 2018-09-10 NOTE — Progress Notes (Signed)
Letter  

## 2018-09-13 DIAGNOSIS — K641 Second degree hemorrhoids: Secondary | ICD-10-CM | POA: Diagnosis not present

## 2018-09-20 ENCOUNTER — Telehealth: Payer: Self-pay

## 2018-09-20 NOTE — Telephone Encounter (Signed)
Left message for patient to call back, (reference labs)

## 2018-09-20 NOTE — Telephone Encounter (Signed)
-----   Message from Marlon Pel, RN sent at 09/20/2018  9:59 AM EDT -----  ----- Message ----- From: Marlon Pel, RN Sent: 09/20/2018 To: Marlon Pel, RN  Needs labs- [facility]uster- see results 2/21

## 2018-09-20 NOTE — Telephone Encounter (Signed)
Patient called and requested to come in for labs. Will be drawn at his Cardiologist

## 2018-09-20 NOTE — Telephone Encounter (Signed)
Pt called back and would like for you to return call. Please leave a detailed msg if he do not answer.

## 2018-09-25 ENCOUNTER — Telehealth: Payer: Self-pay

## 2018-09-25 NOTE — Telephone Encounter (Signed)
Called and scheduled pt for a virtual visit with you on June 18th.  Pt having labs done next week at his Cardiologist's office.  Ok to refill Entecavir 0.5mg ? - 1 tablet by mouth every other day.  #15, refills?  Thanks.

## 2018-09-26 MED ORDER — ENTECAVIR 0.5 MG PO TABS: 0.5000 mg | ORAL_TABLET | ORAL | 1 refills | Status: DC

## 2018-09-26 NOTE — Telephone Encounter (Signed)
Script refilled

## 2018-09-26 NOTE — Telephone Encounter (Signed)
Yes okay to refill. Can give #[phone]. I will see him in June. Thanks

## 2018-09-28 ENCOUNTER — Telehealth: Payer: Self-pay

## 2018-09-28 ENCOUNTER — Other Ambulatory Visit: Payer: Self-pay | Admitting: Emergency Medicine

## 2018-09-28 DIAGNOSIS — R7401 Elevation of levels of liver transaminase levels: Secondary | ICD-10-CM

## 2018-09-28 DIAGNOSIS — N184 Chronic kidney disease, stage 4 (severe): Secondary | ICD-10-CM

## 2018-09-28 DIAGNOSIS — B181 Chronic viral hepatitis B without delta-agent: Secondary | ICD-10-CM

## 2018-09-28 NOTE — Telephone Encounter (Signed)
-----   Message from Algernon Huxley, RN sent at 06/29/2018  3:50 PM EST ----- Regarding: Labs Pt needs Cmet and Hep B DNA drawn per Dr. Havery Moros, see [place] result from 06/28/18.

## 2018-09-28 NOTE — Telephone Encounter (Signed)
Called and spoke to Dr. Antionette Char office.  They can see the lab orders in Epic. Pt can go there to have drawn

## 2018-10-01 ENCOUNTER — Other Ambulatory Visit: Payer: Self-pay

## 2018-10-01 ENCOUNTER — Ambulatory Visit (INDEPENDENT_AMBULATORY_CARE_PROVIDER_SITE_OTHER): Payer: Medicaid Other | Admitting: Gastroenterology

## 2018-10-01 VITALS — Temp 98.3°F

## 2018-10-01 DIAGNOSIS — Z23 Encounter for immunization: Secondary | ICD-10-CM

## 2018-10-05 ENCOUNTER — Other Ambulatory Visit: Payer: Self-pay

## 2018-10-05 ENCOUNTER — Other Ambulatory Visit: Payer: Medicaid Other | Admitting: *Deleted

## 2018-10-05 ENCOUNTER — Other Ambulatory Visit (HOSPITAL_COMMUNITY)
Admission: RE | Admit: 2018-10-05 | Discharge: 2018-10-05 | Disposition: A | Discharge status: Home / Self Care | Payer: Medicaid Other | Source: Ambulatory Visit | Attending: Gastroenterology | Admitting: Gastroenterology

## 2018-10-05 DIAGNOSIS — E782 Mixed hyperlipidemia: Secondary | ICD-10-CM | POA: Diagnosis not present

## 2018-10-05 DIAGNOSIS — B181 Chronic viral hepatitis B without delta-agent: Secondary | ICD-10-CM | POA: Insufficient documentation

## 2018-10-05 LAB — COMPREHENSIVE METABOLIC PANEL
ALT: 18 U/L (ref 0–44)
AST: 17 U/L (ref 15–41)
Albumin: 3.9 g/dL (ref 3.5–5.0)
Alkaline Phosphatase: 66 U/L (ref 38–126)
Anion gap: 10 (ref 5–15)
BUN: 27 mg/dL — ABNORMAL HIGH (ref 6–20)
CO2: 24 mmol/L (ref 22–32)
Calcium: 9.5 mg/dL (ref 8.9–10.3)
Chloride: 103 mmol/L (ref 98–111)
Creatinine, Ser: 2.21 mg/dL — ABNORMAL HIGH (ref 0.61–1.24)
GFR calc Af Amer: 37 mL/min — ABNORMAL LOW (ref >60)
GFR calc non Af Amer: 32 mL/min — ABNORMAL LOW (ref >60)
Glucose, Bld: 98 mg/dL (ref 70–99)
Potassium: 3.5 mmol/L (ref 3.5–5.1)
Sodium: 137 mmol/L (ref 135–145)
Total Bilirubin: 0.9 mg/dL (ref 0.3–1.2)
Total Protein: 7.1 g/dL (ref 6.5–8.1)

## 2018-10-05 LAB — HEPATIC FUNCTION PANEL
ALT: 15 IU/L (ref 0–44)
AST: 19 IU/L (ref 0–40)
Albumin: 4.3 g/dL (ref 3.8–4.9)
Alkaline Phosphatase: 69 IU/L (ref 39–117)
Bilirubin Total: 0.7 mg/dL (ref 0.0–1.2)
Bilirubin, Direct: 0.19 mg/dL (ref 0.00–0.40)
Total Protein: 6.8 g/dL (ref 6.0–8.5)

## 2018-10-08 ENCOUNTER — Other Ambulatory Visit: Payer: Self-pay

## 2018-10-08 DIAGNOSIS — B181 Chronic viral hepatitis B without delta-agent: Secondary | ICD-10-CM

## 2018-10-09 LAB — MISC LABCORP TEST (SEND OUT): Labcorp test code: 551620

## 2018-10-10 ENCOUNTER — Telehealth: Payer: Self-pay

## 2018-10-10 NOTE — Telephone Encounter (Signed)
Scheduled the patient for overdue visit 6/12 with Dr. Burt Knack. He will have medications and VS ready for pre-call. Consent obtained for video visit.   Virtual Visit Pre-Appointment Phone Call Confirm consent - "In the setting of the current Covid19 crisis, you are scheduled for a (phone or video) visit with your provider on (date) at (time).  Just as we do with many in-office visits, in order for you to participate in this visit, we must obtain consent.  If you'd like, I can send this to your mychart (if signed up) or email for you to review.  Otherwise, I can obtain your verbal consent now.  All virtual visits are billed to your insurance company just like a normal visit would be.  By agreeing to a virtual visit, we'd like you to understand that the technology does not allow for your provider to perform an examination, and thus may limit your provider's ability to fully assess your condition. If your provider identifies any concerns that need to be evaluated in person, we will make arrangements to do so.  Finally, though the technology is pretty good, we cannot assure that it will always work on either your or our end, and in the setting of a video visit, we may have to convert it to a phone-only visit.  In either situation, we cannot ensure that we have a secure connection.  Are you willing to proceed?" STAFF: Did the patient verbally acknowledge consent to telehealth visit? Document YES/NO here: YES    TELEPHONE CALL NOTE  Cody Ortega has been deemed a candidate for a follow-up tele-health visit to limit community exposure during the Covid-19 pandemic. I spoke with the patient via phone to ensure availability of phone/video source, confirm preferred email & phone number, and discuss instructions and expectations.  I reminded Cody Ortega to be prepared with any vital sign and/or heart rhythm information that could potentially be obtained via home monitoring, at the time of his visit. I  reminded Cody Ortega to expect a phone call prior to his visit.    IF USING DOXIMITY or [email or web address] - The patient will receive a link just prior to their visit by text.

## 2018-10-12 ENCOUNTER — Telehealth (INDEPENDENT_AMBULATORY_CARE_PROVIDER_SITE_OTHER): Payer: Medicaid Other | Admitting: Cardiovascular Disease

## 2018-10-12 ENCOUNTER — Other Ambulatory Visit: Payer: Self-pay

## 2018-10-12 ENCOUNTER — Encounter: Payer: Self-pay | Admitting: Cardiovascular Disease

## 2018-10-12 VITALS — BP 134/82 | HR 59 | Ht 75.0 in | Wt 259.0 lb

## 2018-10-12 DIAGNOSIS — I2510 Atherosclerotic heart disease of native coronary artery without angina pectoris: Secondary | ICD-10-CM

## 2018-10-12 DIAGNOSIS — R748 Abnormal levels of other serum enzymes: Secondary | ICD-10-CM

## 2018-10-12 DIAGNOSIS — E782 Mixed hyperlipidemia: Secondary | ICD-10-CM

## 2018-10-12 DIAGNOSIS — I5022 Chronic systolic (congestive) heart failure: Secondary | ICD-10-CM

## 2018-10-12 DIAGNOSIS — I480 Paroxysmal atrial fibrillation: Secondary | ICD-10-CM

## 2018-10-12 NOTE — Patient Instructions (Signed)
Medication Instructions:  Your provider recommends that you continue on your current medications as directed. Please refer to the Current Medication list given to you today.    Labwork: Please have labs drawn at your PCP's office in 4-6 weeks. (basic metabolic panel to check kidney function)  Testing/Procedures: Your provider has requested that you have an echocardiogram in 6 months. Echocardiography is a painless test that uses sound waves to create images of your heart. It provides your doctor with information about the size and shape of your heart and how well your heart's chambers and valves are working. This procedure takes approximately one hour. There are no restrictions for this procedure.  Follow-Up: You will be called to schedule your echo and same day office visit with Dr. Burt Knack when the schedule for December is out.

## 2018-10-12 NOTE — Addendum Note (Signed)
Addended by: Harland German A on: 10/12/2018 02:44 PM   Modules accepted: Orders

## 2018-10-12 NOTE — Progress Notes (Signed)
Virtual Visit via Video Note   This visit type was conducted due to national recommendations for restrictions regarding the COVID-19 Pandemic (e.g. social distancing) in an effort to limit this patient's exposure and mitigate transmission in our community.  Due to his co-morbid illnesses, this patient is at least at moderate risk for complications without adequate follow up.  This format is felt to be most appropriate for this patient at this time.  All issues noted in this document were discussed and addressed.  A limited physical exam was performed with this format.  Please refer to the patient's chart for his consent to telehealth for [facility].   Date:  10/12/2018   ID:  Cody Ortega, DOB Jan 11, 1961, MRN [id number]  Patient Location: Home Provider Location: Home  PCP:  Ladell Pier, MD  Cardiologist:  [name], MD   Electrophysiologist:  None   Evaluation Performed:  Follow-Up Visit  Chief Complaint:  Shortness of breath  History of Present Illness:    Cody Ortega is a 58 y.o. male with history of chronic systolic heart failure, paroxysmal atrial fibrillation, and coronary artery disease, presenting today for follow-up evaluation via video conferencing technology in light of the current COVID-19 pandemic.  The patient is initially evaluated via video conferencing technology, but we lost our feed and converted to a telephone visit later on.  He reports no significant interval change in his symptoms since the time of his last evaluation with me.  He continues to have some limitation from mild exertional dyspnea, but he can perform all of his normal daily activities without symptoms.  He has not been as active during the COVID-19 pandemic because he is no longer playing volleyball or swimming at the pool.  These were his primary forms of physical activity in the past.  He continues to have issues with an early morning cough, but this does not cause problems  through the remainder of the day.  He denies chest pain, chest pressure, or leg swelling.  He denies orthopnea or PND.  Notes that he is gained some weight and attributes this to inactivity.  The patient does not have symptoms concerning for COVID-19 infection (fever, chills, cough, or new shortness of breath).    Past Medical History:  Diagnosis Date  . Anxiety   . Arthritis   . Benzodiazepine dependence ([place])   . Bronchial asthma   . CAD (coronary artery disease)    a. Cath 03/2010: mod RCA stenosis, severe diag stenosis, treated medically given lack of angina.  . 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 Chronic systolic congestive heart failure (HCC)    Acute decompensation, LVEF less than 20%  . CKD (chronic kidney disease), stage IV (HCC)    Stage 3-4  . Depression   . Diverticulosis   . Hepatitis B   . History of cocaine abuse ([place])   . Hypertension   . Microcytic anemia   . PAF (paroxysmal atrial fibrillation) ([place])    a. Episode in 2008 with spontaneous conversion  . S/P implantation of automatic cardioverter/defibrillator (AICD)    a. Medtronic, implanted 2012.  . Sleep apnea   . Stroke [facility]) 2004  . Thyroid disease    Past Surgical History:  Procedure Laterality Date  . CARDIOVERSION N/A 01/07/2014   Procedure: CARDIOVERSION;  Surgeon: Lelon Perla, MD;  Location: [facility] ENDOSCOPY;  Service: Cardiovascular;  Laterality: N/A;  . CARDIOVERSION N/A 02/11/2014   Procedure: CARDIOVERSION;  Surgeon: Nadean Corwin.  Hilty, MD;  Location: [place];  Service: Cardiovascular;  Laterality: N/A;  . COLONOSCOPY WITH PROPOFOL N/A 02/06/2018   Procedure: COLONOSCOPY WITH PROPOFOL;  Surgeon: Yetta Flock, MD;  Location: [facility] ENDOSCOPY;  Service: Gastroenterology;  Laterality: N/A;  . LASEK eye surgery Bilateral 05/2018  . PACEMAKER INSERTION    . POLYPECTOMY  02/06/2018   Procedure: POLYPECTOMY;  Surgeon: Yetta Flock, MD;  Location: [facility] ENDOSCOPY;  Service:  Gastroenterology;;  . TEE WITHOUT CARDIOVERSION N/A 01/07/2014   Procedure: TRANSESOPHAGEAL ECHOCARDIOGRAM (TEE);  Surgeon: Lelon Perla, MD;  Location: [facility] ENDOSCOPY;  Service: Cardiovascular;  Laterality: N/A;  . TEE WITHOUT CARDIOVERSION N/A 02/11/2014   Procedure: TRANSESOPHAGEAL ECHOCARDIOGRAM (TEE);  Surgeon: Pixie Casino, MD;  Location: [facility]OSCOPY;  Service: Cardiovascular;  Laterality: N/A;  . TONSILLECTOMY    . TRANSTHORACIC ECHOCARDIOGRAM  10/2006, 12/2009     Current Meds  Medication Sig  . albuterol (VENTOLIN HFA) 108 (90 Base) MCG/ACT inhaler INHALE 2 PUFFS INTO THE LUNGS EVERY 6 HOURS AS NEEDED FOR WHEEZING OR SHORTNESS OF BREATH  . alprazolam (XANAX) 2 MG tablet Take 2 mg by mouth every 6 (six) hours as needed for anxiety.   Marland Kitchen amiodarone (PACERONE) 200 MG tablet TAKE 1 TABLET(200 MG) BY MOUTH DAILY  . amLODipine (NORVASC) 10 MG tablet TAKE 1 TABLET BY MOUTH EVERY DAY  . aspirin 81 MG EC tablet Take 81 mg by mouth at bedtime.   . carvedilol (COREG) 12.5 MG tablet TAKE 1 TABLET(12.5 MG) BY MOUTH TWICE DAILY WITH A MEAL  . cycloSPORINE (RESTASIS) 0.05 % ophthalmic emulsion Place 1 drop into both eyes 2 (two) times daily.  . DULoxetine (CYMBALTA) 60 MG capsule Take 60 mg by mouth daily.  Marland Kitchen entecavir (BARACLUDE) 0.5 MG tablet Take 1 tablet (0.5 mg total) by mouth every other day. Please note: Every OTHER day  . ezetimibe (ZETIA) 10 MG tablet Take 1 tablet (10 mg total) by mouth daily.  . ferrous sulfate 325 (65 FE) MG tablet Take 325 mg by mouth daily with breakfast.  . hydrALAZINE (APRESOLINE) 25 MG tablet Take 25 mg by mouth 2 (two) times a day.  . mometasone (NASONEX) 50 MCG/ACT nasal spray Place 2 sprays into the nose daily.  . ondansetron (ZOFRAN) 4 MG tablet Take 4 mg by mouth 2 (two) times daily as needed for nausea or vomiting.   . Rivaroxaban (XARELTO) 15 MG TABS tablet Take 1 tablet (15 mg total) by mouth daily with supper.  . sildenafil (REVATIO) 20 MG tablet TAKE  2-5 TABLETS BY MOUTH DAILY AS NEEDED PRIOR TO SEXUAL ACTIVITY (Patient taking differently: Take 40-100 mg by mouth daily as needed (erectile dysfunction). )  . Spacer/Aero-Holding Chambers (AEROCHAMBER MV) inhaler Use as instructed  . SYMBICORT 160-4.5 MCG/ACT inhaler INHALE 2 PUFFS INTO THE LUNGS TWICE DAILY     Allergies:   Codeine and Hydrocodone-acetaminophen   Social History   Tobacco Use  . Smoking status: Former Smoker    Packs/day: 0.50    Years: 0.50    Pack years: 0.25    Types: Cigarettes    Quit date: 05/02/2010    Years since quitting: 8.4  . Smokeless tobacco: Never Used  . Tobacco comment: Significant Second-hand and only 3 months himself  Substance Use Topics  . Alcohol use: Not Currently    Alcohol/week: 0.0 standard drinks    Comment: rare  . Drug use: No    Comment: Reported history of cocaine abuse off and on  Family Hx: The patient's family history includes Breast cancer in his mother; Cerebral palsy in his daughter; Diabetes in his father; Multiple myeloma in his mother; Peripheral vascular disease in his father; Prostate cancer in his father. There is no history of Lung disease, Colon cancer, Stomach cancer, Esophageal cancer, or Colon polyps.  ROS:   Please see the history of present illness.    All other systems reviewed and are negative.   Prior CV studies:   The following studies were reviewed today:  2D echocardiogram 10/28/2015: Study Conclusions  - Left ventricle: The cavity size was normal. Wall thickness was   increased in a pattern of moderate LVH. The estimated ejection   fraction was 35%. Diffuse hypokinesis. Doppler parameters are   consistent with abnormal left ventricular relaxation (grade 1   diastolic dysfunction). - Aortic valve: There was no stenosis. - Aorta: Mildly dilated aortic root. Aortic root dimension: 38 mm   (ED). - Mitral valve: Mildly calcified annulus. Mildly calcified leaflets   . There was no significant  regurgitation. - Left atrium: The atrium was moderately dilated. - Right ventricle: Poorly visualized. The cavity size was normal.   Pacer wire or catheter noted in right ventricle. Systolic   function was normal. - Right atrium: The atrium was mildly dilated. - Tricuspid valve: Peak RV-RA gradient (S): 23 mm Hg. - Pulmonary arteries: PA peak pressure: 38 mm Hg (S). - Systemic veins: IVC measured 2.2 cm with < 50% respirophasic   variation, suggesting RA pressure 15 mmHg.  Impressions:  - Technically difficult study with poor acoustic windows. Echo   contrast may have helped but was not used. Normal LV size with   moderate LV hypertrophy. Diffuse hypokinesis, EF 35%. Normal RV   size and systolic function (poorly visualized).  Transthoracic echocardiography.  M-mode, complete 2D, spectral Doppler, and color Doppler.  Birthdate:  Patient birthdate: Sep 12, 1960.  Age:  Patient is 58 yr old.  Sex:  Gender: male. BMI: 37.5 kg/m^2.  Blood pressure:     130/84  Patient status: Outpatient.  Study date:  Study date: 10/28/2015. Study time: 11:00 AM.  Location[place]  [place] [facility]  -------------------------------------------------------------------  ------------------------------------------------------------------- Left ventricle:  The cavity size was normal. Wall thickness was increased in a pattern of moderate LVH. The estimated ejection fraction was 35%. Diffuse hypokinesis. Doppler parameters are consistent with abnormal left ventricular relaxation (grade 1 diastolic dysfunction).  ------------------------------------------------------------------- Aortic valve:   Trileaflet.  Doppler:   There was no stenosis. There was no regurgitation.  ------------------------------------------------------------------- Aorta:  Mildly dilated aortic root.  ------------------------------------------------------------------- Mitral valve:   Mildly calcified annulus. Mildly calcified  leaflets .  Doppler:   There was no evidence for stenosis.   There was no significant regurgitation.  ------------------------------------------------------------------- Left atrium:  The atrium was moderately dilated.  ------------------------------------------------------------------- Right ventricle:  Poorly visualized. The cavity size was normal. Pacer wire or catheter noted in right ventricle. Systolic function was normal.  ------------------------------------------------------------------- Pulmonic valve:    Structurally normal valve.   Cusp separation was normal.  Doppler:  Transvalvular velocity was within the normal range. There was no regurgitation.  ------------------------------------------------------------------- Tricuspid valve:   Doppler:  There was trivial regurgitation.   ------------------------------------------------------------------- Right atrium:  The atrium was mildly dilated.  ------------------------------------------------------------------- Pericardium:  There was no pericardial effusion.  ------------------------------------------------------------------- Systemic veins:  IVC measured 2.2 cm with < 50% respirophasic variation, suggesting RA pressure 15 mmHg.   Labs/Other Tests and Data Reviewed:    EKG:  No ECG  reviewed.  Recent Labs: 07/09/2018: Hemoglobin 16.4; Platelets 185; TSH 1.450 10/05/2018: ALT 15; ALT 18; BUN 27; Creatinine, Ser 2.21; Potassium 3.5; Sodium 137   Recent Lipid Panel Lab Results  Component Value Date/Time   CHOL 180 07/09/2018 03:27 PM   TRIG 118 07/09/2018 03:27 PM   HDL 40 07/09/2018 03:27 PM   CHOLHDL 4.5 07/09/2018 03:27 PM   CHOLHDL 3.9 10/12/2015 10:26 AM   LDLCALC 116 (H) 07/09/2018 03:27 PM    Wt Readings from Last 3 Encounters:  10/12/18 259 lb (117.5 kg)  07/13/18 246 lb (111.6 kg)  06/12/18 256 lb 3.2 oz (116.2 kg)     Objective:    Vital Signs:  BP 134/82   Pulse (!) 59   Ht '6\' 3"'$  (1.905  m)   Wt 259 lb (117.5 kg)   BMI 32.37 kg/m    VITAL SIGNS:  reviewed The patient is alert, oriented, in no distress.  He is breathing comfortably in normal conversation.  Remaining exam deferred as this is a telehealth/video visit.  ASSESSMENT & PLAN:    1. Chronic systolic heart failure: The patient appears stable from a cardiac perspective.  When he returns for follow-up I would like to have an updated echocardiogram.  His last study from 2017 is reviewed today.  He is managed on a combination of carvedilol, hydralazine.  He has not been able to tolerate ACE/ARB secondary to chronic kidney disease with risk of AKI.  Currently appears to have New York Heart Association functional class II symptoms. 2. Coronary artery disease, native vessel, without angina: The patient is stable with no anginal symptoms and will be continued on his current treatment. 3. Paroxysmal atrial fibrillation: Chronically anticoagulated without bleeding problems.  Medical program is reviewed.  Tolerating amiodarone well.  Note LFTs have normalized. 4. Hypertension, malignant: The patient has had a lot of trouble with severe hypertension over the years and may have an underlying hypertensive cardiomyopathy.  He is treated with carvedilol, hydralazine, and amlodipine.  Not able to tolerate ACE/ARB/Entresto secondary to kidney disease. 5. Elevated LFTs: Patient now followed by Dr. Havery Moros after diagnosis with hepatitis B.  LFTs normalized on most recent labs. 6. Chronic kidney disease stage III: Progressive renal dysfunction noted on most recent labs.  I do not see any nephrotoxic agents in his medical regimen.  Follow-up labs recommended within 4 to 6 weeks to make sure he is not trending in the wrong direction.  Will send a request for labs and follow-up to his primary physician.  COVID-19 Education: The signs and symptoms of COVID-19 were discussed with the patient and how to seek care for testing (follow up with PCP or  arrange E-visit). The importance of social distancing was discussed today.  Time:   Today, I have spent 20 minutes with the patient with telehealth technology discussing the above problems.     Medication Adjustments/Labs and Tests Ordered: Current medicines are reviewed at length with the patient today.  Concerns regarding medicines are outlined above.   Tests Ordered: No orders of the defined types were placed in this encounter.   Medication Changes: No orders of the defined types were placed in this encounter.   Follow Up:  Virtual Visit or In Person in 6 month(s)  Signed, [name], MD  10/12/2018 12:57 PM    [place]

## 2018-10-16 ENCOUNTER — Telehealth: Payer: Self-pay

## 2018-10-16 NOTE — Telephone Encounter (Signed)
-----   Message from Yetta Flock, MD sent at 10/15/2018  5:38 PM EDT ----- Thanks for the help Sherlynn Stalls, looks like he has a negative viral load which is excellent news. He should continue his present meds. I would like to see him for a follow up visit at his convenience, if you can help coordinate, will discuss his follow up labs at that time. He otherwise needs to follow up with is PCP regarding kidney function testing. Thanks.

## 2018-10-16 NOTE — Telephone Encounter (Signed)
Left message to call back  

## 2018-10-17 ENCOUNTER — Telehealth: Payer: Self-pay

## 2018-10-17 NOTE — Telephone Encounter (Signed)
-----   Message from Hughie Closs, RN sent at 10/08/2018 11:32 AM EDT ----- Call pt. And ask him to go to his Cardiologist lab for BMET =10/19/18 (2 wks. Out)

## 2018-10-17 NOTE — Telephone Encounter (Signed)
Called and prescreened pt for 6-18 appt with Dr. Havery Moros

## 2018-10-17 NOTE — Telephone Encounter (Signed)
Pt returning phone call ° °

## 2018-10-17 NOTE — Progress Notes (Signed)
Prescreened pt for tomorrow's visit.

## 2018-10-17 NOTE — Telephone Encounter (Signed)
LM for pt to call back to prescreen for visit tomorrow with Dr. Havery Moros at 9:00am

## 2018-10-17 NOTE — Telephone Encounter (Signed)
Left message on patient's phone to please go into his Cardiologist lab and have his BMET drawn between 10/19/18-10/22/18

## 2018-10-18 ENCOUNTER — Ambulatory Visit (INDEPENDENT_AMBULATORY_CARE_PROVIDER_SITE_OTHER): Payer: Medicaid Other | Admitting: Gastroenterology

## 2018-10-18 ENCOUNTER — Encounter: Payer: Self-pay | Admitting: Gastroenterology

## 2018-10-18 VITALS — Ht 75.0 in | Wt 259.0 lb

## 2018-10-18 DIAGNOSIS — B181 Chronic viral hepatitis B without delta-agent: Secondary | ICD-10-CM

## 2018-10-18 DIAGNOSIS — K7460 Unspecified cirrhosis of liver: Secondary | ICD-10-CM | POA: Diagnosis not present

## 2018-10-18 DIAGNOSIS — N189 Chronic kidney disease, unspecified: Secondary | ICD-10-CM

## 2018-10-18 NOTE — Progress Notes (Signed)
Virtual Visit via Video Note  I connected with Cody Ortega on 10/18/18 at  9:00 AM EDT by a video enabled telemedicine application and verified that I am speaking with the correct person using two identifiers.  I discussed the limitations of evaluation and management by telemedicine and the availability of in person appointments. The patient expressed understanding and agreed to proceed.   THIS ENCOUNTER IS A VIRTUAL VISIT DUE TO COVID-19 - PATIENT WAS NOT SEEN IN THE OFFICE. PATIENT HAS CONSENTED TO VIRTUAL VISIT USING DOXIMITY APP FOR AUDIO AND VISUAL CAPABILITY   Location of patient: home Location of provider: office Persons participating: myself, patient   HPI :  58 y/o male here for a follow up visit for hepatitis B associated cirrhosis. He also has a history of CAD, CHF with ICD in place, atrial fibrillation on Xarelto, history of stroke, history of rectal bleeding due to hemorrhoids,  He was diagnosed with chronic hep B last year, high viral load, (+) hep B E antigen. His liver imaging has suggested cirrhosis. He has not had any decompensations. He was treated with Entecavir 0.39m every other day due to his CKD, started in November / December time frame. His viral load, hep B DNA decreased from 33 million to negative from November to June. ALT has done from 145 to 18 over this time. His most recent creatinine unfortunately is increased slightly to 2.21. He had a UKoreaof the liver in February showed changes concerning for cirrhosis with a septated cyst in the liver  His weight has gone up slightly. Otherwise feeling pretty well. No jaundice, no ascites, no history of varices or encephalopathy.  Last echo showed EF 35% in 2017, repeat Echo ordered by Dr. CIlla LevelCoreg 12.531mBID for history of CHF  Recent labs 10/05/18 showed the following: Cr of 2.21 (previously 1.5s), BUN 27 ALT 18, AST 17 (4 months ago 145 and 111) Hep B DNA now NEGATIVE, previously in November level of 33  million IU / ml  US[place]2/27/20 - concerning changes of cirrhosis, 2cm septated cyst in the liver  Colonoscopy 02/06/2018 - 7 small polyps, diverticulosis, hemorrhoids - adenomas - repeat in 3 years    Past Medical History:  Diagnosis Date  . Anxiety   . Arthritis   . Benzodiazepine dependence ([facility]  . Bronchial asthma   . CAD (coronary artery disease)    a. Cath 03/2010: mod RCA stenosis, severe diag stenosis, treated medically given lack of angina.  . 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 Chronic systolic congestive heart failure (HCC)    Acute decompensation, LVEF less than 20%  . CKD (chronic kidney disease), stage IV (HCC)    Stage 3-4  . Depression   . Diverticulosis   . Hepatitis B   . History of cocaine abuse (HCRehobeth  . Hypertension   . Microcytic anemia   . PAF (paroxysmal atrial fibrillation) ([place]   a. Episode in 2008 with spontaneous conversion  . S/P implantation of automatic cardioverter/defibrillator (AICD)    a. Medtronic, implanted 2012.  . Sleep apnea   . St[facility] ([facility]  . Thyroid disease      Past Surgical History:  Procedure Laterality Date  . CARDIOVERSION N/A 01/07/2014   Procedure: CARDIOVERSION;  Surgeon: BrLelon PerlaMD;  [facility];  Service: Cardiovascular;  Laterality: N/A;  . CARDIOVERSION N/A 02/11/2014   Procedure: CARDIOVERSION;  Surgeon: KePixie CasinoMD;  [facility] END[facility]COPY;  Service: Cardiovascular;  Laterality: N/A;  . COLONOSCOPY WITH PROPOFOL N/A 02/06/2018   Procedure: COLONOSCOPY WITH PROPOFOL;  Surgeon: Yetta Flock, MD;  [facility];  Service: Gastroenterology;  Laterality: N/A;  . LASEK eye surgery Bilateral 05/2018  . PACEMAKER INSERTION    . POLYPECTOMY  02/06/2018   Procedure: POLYPECTOMY;  Surgeon: Yetta Flock, MD;  [facility];  Service: Gastroenterology;;  . TEE WITHOUT CARDIOVERSION N/A 01/07/2014   Procedure: TRANSESOPHAGEAL ECHOCARDIOGRAM (TEE);   Surgeon: Lelon Perla, MD;  Location: [facility] ENDOSCOPY;  Service: Cardiovascular;  Laterality: N/A;  . TEE WITHOUT CARDIOVERSION N/A 02/11/2014   Procedure: TRANSESOPHAGEAL ECHOCARDIOGRAM (TEE);  Surgeon: Pixie Casino, MD;  Location[facility] ENDOSCOPY;  Service: Cardiovascular;  Laterality: N/A;  . TONSILLECTOMY    . TRANSTHORACIC ECHOCARDIOGRAM  10/2006, 12/2009   Family History  Problem Relation Age of Onset  . Breast cancer Mother   . Multiple myeloma Mother   . Prostate cancer Father   . Diabetes Father   . Peripheral vascular disease Father   . Cerebral palsy Daughter   . Lung disease Neg Hx   . Colon cancer Neg Hx   . Stomach cancer Neg Hx   . Esophageal cancer Neg Hx   . Colon polyps Neg Hx    Social History   Tobacco Use  . Smoking status: Former Smoker    Packs/day: 0.50    Years: 0.50    Pack years: 0.25    Types: Cigarettes    Quit date: 05/02/2010    Years since quitting: 8.4  . Smokeless tobacco: Never Used  . Tobacco comment: Significant Second-hand and only 3 months himself  Substance Use Topics  . Alcohol use: Not Currently    Alcohol/week: 0.0 standard drinks    Comment: rare  . Drug use: No    Comment: Reported history of cocaine abuse off and on    Current Outpatient Medications  Medication Sig Dispense Refill  . albuterol (VENTOLIN HFA) 108 (90 Base) MCG/ACT inhaler INHALE 2 PUFFS INTO THE LUNGS EVERY 6 HOURS AS NEEDED FOR WHEEZING OR SHORTNESS OF BREATH 8.5 g 2  . alprazolam (XANAX) 2 MG tablet Take 2 mg by mouth every 6 (six) hours as needed for anxiety.     Marland Kitchen amiodarone (PACERONE) 200 MG tablet TAKE 1 TABLET(200 MG) BY MOUTH DAILY 30 tablet 7  . amLODipine (NORVASC) 10 MG tablet TAKE 1 TABLET BY MOUTH EVERY DAY 30 tablet 6  . aspirin 81 MG EC tablet Take 81 mg by mouth at bedtime.     . carvedilol (COREG) 12.5 MG tablet TAKE 1 TABLET(12.5 MG) BY MOUTH TWICE DAILY WITH A MEAL 180 tablet 3  . cycloSPORINE (RESTASIS) 0.05 % ophthalmic emulsion Place 1  drop into both eyes 2 (two) times daily.    . DULoxetine (CYMBALTA) 60 MG capsule Take 60 mg by mouth daily.    Marland Kitchen entecavir (BARACLUDE) 0.5 MG tablet Take 1 tablet (0.5 mg total) by mouth every other day. Please note: Every OTHER day 90 tablet 1  . ezetimibe (ZETIA) 10 MG tablet Take 1 tablet (10 mg total) by mouth daily. 30 tablet 11  . ferrous sulfate 325 (65 FE) MG tablet Take 325 mg by mouth daily with breakfast.    . hydrALAZINE (APRESOLINE) 25 MG tablet Take 25 mg by mouth 2 (two) times a day.    . mometasone (NASONEX) 50 MCG/ACT nasal spray Place 2 sprays into the nose daily. 17 g  5  . ondansetron (ZOFRAN) 4 MG tablet Take 4 mg by mouth 2 (two) times daily as needed for nausea or vomiting.   0  . Rivaroxaban (XARELTO) 15 MG TABS tablet Take 1 tablet (15 mg total) by mouth daily with supper. 90 tablet 3  . sildenafil (REVATIO) 20 MG tablet TAKE 2-5 TABLETS BY MOUTH DAILY AS NEEDED PRIOR TO SEXUAL ACTIVITY (Patient taking differently: Take 40-100 mg by mouth daily as needed (erectile dysfunction). ) 180 tablet 3  . SYMBICORT 160-4.5 MCG/ACT inhaler INHALE 2 PUFFS INTO THE LUNGS TWICE DAILY 10.2 g 1   No current facility-administered medications for this visit.    Allergies  Allergen Reactions  . Codeine Itching, Nausea And Vomiting and Hives  . Hydrocodone-Acetaminophen Itching and Nausea And Vomiting     Review of Systems: All systems reviewed and negative except where noted in HPI.   Lab Results  Component Value Date   WBC 7.0 07/09/2018   HGB 16.4 07/09/2018   HCT 47.9 07/09/2018   MCV 93 07/09/2018   PLT 185 07/09/2018    Lab Results  Component Value Date   CREATININE 2.21 (H) 10/05/2018   BUN 27 (H) 10/05/2018   NA 137 10/05/2018   K 3.5 10/05/2018   CL 103 10/05/2018   CO2 24 10/05/2018    Lab Results  Component Value Date   ALT 15 10/05/2018   ALT 18 10/05/2018   AST 19 10/05/2018   AST 17 10/05/2018   ALKPHOS 69 10/05/2018   ALKPHOS 66 10/05/2018    BILITOT 0.7 10/05/2018   BILITOT 0.9 10/05/2018   Lab Results  Component Value Date   INR 1.14 12/25/2013   INR 1.0 11/29/2010   INR 1.0 ratio 03/15/2010      Physical Exam: NA   ASSESSMENT AND PLAN: 58 y/o male here for reassessment of the following:  Chronic hepatitis B / Cirrhosis / Chronic kidney disease - patient with chronic hepatitis B E antigen positive, with suspected cirrhosis based on imaging. He has had no decompensations. He is now on Entecavir 0.44m every other day and his Hep B DNA is now undetectable and his ALT has normalized, this is excellent news. We discussed that he should continue this indefinitely at this point given his cirrhosis. We discussed cirrhosis in general, long term risks for decompensation and HCC. He is due for a repeat [place]ain [facility], will also send lab for AFP. Given he is already taking Coreg dosed at 12.518mBID I don't feel strongly we need to screen for varices right now. He will need to continue to have labs monitored closely over time. He does not see a nephrologist, and his creatinine was worse recently. I asked him to go back to the lab to recheck his creatinine, will also check AFP. If renal function is not getting better may refer to nephrology. He otherwise should avoid all alcohol, I will see him every 6 months and labs sooner in the interim. He agreed.   StCarolina CellarMD [facility]

## 2018-10-18 NOTE — Patient Instructions (Signed)
If you are age 56 or older, your body mass index should be between 23-30. Your Body mass index is 32.37 kg/m. If this is out of the aforementioned range listed, please consider follow up with your Primary Care Provider.  If you are age 70 or younger, your body mass index should be between 19-25. Your Body mass index is 32.37 kg/m. If this is out of the aformentioned range listed, please consider follow up with your Primary Care Provider.   To help prevent the possible spread of infection to our patients, communities, and staff; we will be implementing the following measures:  As of now we are not allowing any visitors/family members to accompany you to any upcoming appointments with [facility]. If you have any concerns about this please contact our office to discuss prior to the appointment.    You will be due for a RUQ ultrasound in August 2020.  We will contact you when it is time to schedule.  You will be due for a 6 month follow office visit in December 2020.  You will be notified when it is time to schedule.  Current lab orders:  BMET, AFP and Hep B E antigen We understand you will have these drawn at Dr. Antionette Char office next week.  Thank you for entrusting me with your care and for choosing [facility], Dr.  Cellar

## 2018-10-22 DIAGNOSIS — F330 Major depressive disorder, recurrent, mild: Secondary | ICD-10-CM | POA: Diagnosis not present

## 2018-10-22 DIAGNOSIS — F410 Panic disorder [episodic paroxysmal anxiety] without agoraphobia: Secondary | ICD-10-CM | POA: Diagnosis not present

## 2018-10-24 ENCOUNTER — Other Ambulatory Visit: Payer: Self-pay

## 2018-10-24 ENCOUNTER — Other Ambulatory Visit: Payer: Medicaid Other | Admitting: *Deleted

## 2018-10-24 DIAGNOSIS — K7460 Unspecified cirrhosis of liver: Secondary | ICD-10-CM

## 2018-10-24 DIAGNOSIS — B181 Chronic viral hepatitis B without delta-agent: Secondary | ICD-10-CM

## 2018-10-24 DIAGNOSIS — I5022 Chronic systolic (congestive) heart failure: Secondary | ICD-10-CM

## 2018-10-24 DIAGNOSIS — N189 Chronic kidney disease, unspecified: Secondary | ICD-10-CM

## 2018-10-25 LAB — BASIC METABOLIC PANEL
BUN/Creatinine Ratio: 15 (ref 9–20)
BUN: 30 mg/dL — ABNORMAL HIGH (ref 6–24)
CO2: 22 mmol/L (ref 20–29)
Calcium: 9.7 mg/dL (ref 8.7–10.2)
Chloride: 102 mmol/L (ref 96–106)
Creatinine, Ser: 2.06 mg/dL — ABNORMAL HIGH (ref 0.76–1.27)
GFR calc Af Amer: 40 mL/min/{1.73_m2} — ABNORMAL LOW (ref >59)
GFR calc non Af Amer: 35 mL/min/{1.73_m2} — ABNORMAL LOW (ref >59)
Glucose: 93 mg/dL (ref 65–99)
Potassium: 3.9 mmol/L (ref 3.5–5.2)
Sodium: 140 mmol/L (ref 134–144)

## 2018-10-25 LAB — HEPATITIS B E ANTIGEN: Hep B E Ag: POSITIVE — AB

## 2018-10-25 LAB — AFP TUMOR MARKER: AFP, Serum, Tumor Marker: 1.7 ng/mL (ref 0.0–8.3)

## 2018-11-19 ENCOUNTER — Other Ambulatory Visit: Payer: Self-pay | Admitting: Emergency Medicine

## 2018-11-19 DIAGNOSIS — J302 Other seasonal allergic rhinitis: Secondary | ICD-10-CM

## 2018-11-27 ENCOUNTER — Ambulatory Visit: Payer: Medicaid Other | Admitting: Internal Medicine

## 2018-11-29 ENCOUNTER — Telehealth: Payer: Self-pay

## 2018-11-29 DIAGNOSIS — B181 Chronic viral hepatitis B without delta-agent: Secondary | ICD-10-CM

## 2018-11-29 DIAGNOSIS — K7460 Unspecified cirrhosis of liver: Secondary | ICD-10-CM

## 2018-11-29 NOTE — Telephone Encounter (Signed)
-----   Message from Roetta Sessions, [place] sent at 10/18/2018  9:34 AM EDT ----- Regarding: repeat U/S Pt due for RUQ U/S in August - Cirrhosis

## 2018-11-29 NOTE — Telephone Encounter (Signed)
Called and scheduled RUQ ultrasound for August 17th, Monday at 9:00am, arrive 8:45am  NPO 6 hours.  Called and Left detailed message for pt with radiology number to reschedule if necessary

## 2018-12-06 ENCOUNTER — Ambulatory Visit: Payer: Medicaid Other | Admitting: Internal Medicine

## 2018-12-12 ENCOUNTER — Other Ambulatory Visit: Payer: Self-pay | Admitting: Cardiovascular Disease

## 2018-12-14 ENCOUNTER — Ambulatory Visit: Payer: Medicaid Other | Admitting: Gastroenterology

## 2018-12-17 ENCOUNTER — Ambulatory Visit (HOSPITAL_COMMUNITY): Admission: RE | Admit: 2018-12-17 | Payer: Medicaid Other | Source: Ambulatory Visit

## 2018-12-21 ENCOUNTER — Other Ambulatory Visit: Payer: Self-pay | Admitting: Emergency Medicine

## 2018-12-28 ENCOUNTER — Telehealth: Payer: Self-pay

## 2018-12-28 NOTE — Telephone Encounter (Signed)
Called and scheduled pt for RUQ U/S at [facility] on 01-09-19, Wednesday at 9:30am, arrive at 9:15am. NPO after midnight.  Called and LM for pt with details. Mailed letter

## 2018-12-28 NOTE — Telephone Encounter (Signed)
-----   Message from Algernon Huxley, RN sent at 06/29/2018  3:49 PM EST ----- Regarding: [place] Pt need [place] of abd for hcc screening

## 2018-12-31 ENCOUNTER — Ambulatory Visit: Payer: Medicaid Other | Attending: Internal Medicine | Admitting: Internal Medicine

## 2018-12-31 ENCOUNTER — Encounter: Payer: Self-pay | Admitting: Internal Medicine

## 2018-12-31 ENCOUNTER — Other Ambulatory Visit: Payer: Self-pay

## 2018-12-31 VITALS — BP 129/75 | HR 53 | Temp 98.7°F | Resp 16 | Wt 263.6 lb

## 2018-12-31 DIAGNOSIS — N529 Male erectile dysfunction, unspecified: Secondary | ICD-10-CM | POA: Insufficient documentation

## 2018-12-31 DIAGNOSIS — Z7901 Long term (current) use of anticoagulants: Secondary | ICD-10-CM | POA: Insufficient documentation

## 2018-12-31 DIAGNOSIS — R3912 Poor urinary stream: Secondary | ICD-10-CM | POA: Diagnosis not present

## 2018-12-31 DIAGNOSIS — I495 Sick sinus syndrome: Secondary | ICD-10-CM | POA: Insufficient documentation

## 2018-12-31 DIAGNOSIS — Z885 Allergy status to narcotic agent status: Secondary | ICD-10-CM | POA: Diagnosis not present

## 2018-12-31 DIAGNOSIS — K029 Dental caries, unspecified: Secondary | ICD-10-CM

## 2018-12-31 DIAGNOSIS — J449 Chronic obstructive pulmonary disease, unspecified: Secondary | ICD-10-CM | POA: Diagnosis not present

## 2018-12-31 DIAGNOSIS — E7800 Pure hypercholesterolemia, unspecified: Secondary | ICD-10-CM | POA: Diagnosis not present

## 2018-12-31 DIAGNOSIS — Z125 Encounter for screening for malignant neoplasm of prostate: Secondary | ICD-10-CM

## 2018-12-31 DIAGNOSIS — Z7982 Long term (current) use of aspirin: Secondary | ICD-10-CM | POA: Diagnosis not present

## 2018-12-31 DIAGNOSIS — I480 Paroxysmal atrial fibrillation: Secondary | ICD-10-CM | POA: Insufficient documentation

## 2018-12-31 DIAGNOSIS — N183 Chronic kidney disease, stage 3 unspecified: Secondary | ICD-10-CM

## 2018-12-31 DIAGNOSIS — Z87891 Personal history of nicotine dependence: Secondary | ICD-10-CM | POA: Diagnosis not present

## 2018-12-31 DIAGNOSIS — E785 Hyperlipidemia, unspecified: Secondary | ICD-10-CM | POA: Diagnosis not present

## 2018-12-31 DIAGNOSIS — G894 Chronic pain syndrome: Secondary | ICD-10-CM | POA: Diagnosis not present

## 2018-12-31 DIAGNOSIS — Z950 Presence of cardiac pacemaker: Secondary | ICD-10-CM | POA: Diagnosis not present

## 2018-12-31 DIAGNOSIS — Z7951 Long term (current) use of inhaled steroids: Secondary | ICD-10-CM | POA: Diagnosis not present

## 2018-12-31 DIAGNOSIS — I130 Hypertensive heart and chronic kidney disease with heart failure and stage 1 through stage 4 chronic kidney disease, or unspecified chronic kidney disease: Secondary | ICD-10-CM | POA: Insufficient documentation

## 2018-12-31 DIAGNOSIS — Z79899 Other long term (current) drug therapy: Secondary | ICD-10-CM | POA: Diagnosis not present

## 2018-12-31 DIAGNOSIS — B181 Chronic viral hepatitis B without delta-agent: Secondary | ICD-10-CM | POA: Diagnosis not present

## 2018-12-31 DIAGNOSIS — G4733 Obstructive sleep apnea (adult) (pediatric): Secondary | ICD-10-CM | POA: Diagnosis not present

## 2018-12-31 DIAGNOSIS — Z2821 Immunization not carried out because of patient refusal: Secondary | ICD-10-CM | POA: Diagnosis not present

## 2018-12-31 DIAGNOSIS — I2510 Atherosclerotic heart disease of native coronary artery without angina pectoris: Secondary | ICD-10-CM | POA: Diagnosis not present

## 2018-12-31 DIAGNOSIS — Z8249 Family history of ischemic heart disease and other diseases of the circulatory system: Secondary | ICD-10-CM | POA: Insufficient documentation

## 2018-12-31 DIAGNOSIS — I5022 Chronic systolic (congestive) heart failure: Secondary | ICD-10-CM

## 2018-12-31 DIAGNOSIS — K219 Gastro-esophageal reflux disease without esophagitis: Secondary | ICD-10-CM | POA: Insufficient documentation

## 2018-12-31 DIAGNOSIS — H9193 Unspecified hearing loss, bilateral: Secondary | ICD-10-CM

## 2018-12-31 MED ORDER — TAMSULOSIN HCL 0.4 MG PO CAPS: 0.4000 mg | ORAL_CAPSULE | Freq: Every day | ORAL | 3 refills | Status: DC

## 2018-12-31 NOTE — Progress Notes (Signed)
Patient ID: Cody Ortega, male    DOB: Jun 10, 1960  MRN: [id number]  CC: Hypertension   Subjective: Cody Ortega is a 58 y.o. male who presents for chronic ds management His concerns today include:  The patient with history of COPD and asthma, HTN, CKD stage III, CAD, andNICM with chronic systolic CHF and ICD, PAF,immune active hep B,adenomatous polyps, dep/anx(followed by psychiatrist, Noemi Chapel)  CKD stage 3: Pt states his other doctors told him to let me know about his kidney function,  "I use to eat Ibuprofen.  Stopped 1 yr ago.  Takes Tylenol now when needed -b/w now and 1 yr ago, eGFR has ranged b/w 48-32. One time in 06/2018 it was 52.  Most recent Bun/Creat 30/2.06  Reports frequent urination in the mornings only.  Has to strain to get it out and weak stream x few yrs.  Some post void dribbling.  Does not urinate much during the day.  No coffee, but does drink Coke  PAF/sys CHF/CAD:  No CP or significant SOB.  No LE edema, PND or orthopnea Compliant with meds Last saw cardiology 10/2018 Bruise easily on arms since being on Xarelto  OSA:   Never able to successful wear a mask. Went several time for dessenatization but never work.  Recently purchased on mask online but screws around the nasal area of mask rusted so he d/c using it  C/o decrease hearing to conversational voice BL.  This has been going on for a while.  Saw ENT 05/2018 for recurrent problems with LT maxillary sinuses.  Told poor dentition may be playing role and advised to see dentist.  Pt requesting referral to dentist at this time  Chronic hep B: on treatment through Dr. Enis Gash Patient Active Problem List   Diagnosis Date Noted  . Dental abscess 04/30/2018  . Adenomatous polyp of colon 02/09/2018  . 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 Osteoarthritis of both knees 06/12/2017  . COPD with asthma ([place]) 03/17/2016  . Chronic seasonal allergic rhinitis 03/17/2016   . GERD (gastroesophageal reflux disease) 03/17/2016  . Chronic pain syndrome 03/16/2015  . Obstructive sleep apnea 03/16/2015  . Chronic low back pain with right-sided sciatica 03/16/2015  . Sinus brady-tachy syndrome ([place]) 02/20/2014  . Atrial fibrillation ([place]) 12/31/2013  . Automatic implantable cardioverter-defibrillator in situ 03/08/2011  . Erectile dysfunction 02/28/2011  . CAD, NATIVE VESSEL 04/26/2010  . PURE HYPERCHOLESTEROLEMIA 04/12/2010  . HLD (hyperlipidemia) 01/11/2010  . Essential hypertension, malignant 12/18/2009  . Chronic systolic heart failure ([place]) 12/18/2009     Current Outpatient Medications on File Prior to Visit  Medication Sig Dispense Refill  . albuterol (VENTOLIN HFA) 108 (90 Base) MCG/ACT inhaler INHALE 2 PUFFS INTO THE LUNGS EVERY 6 HOURS AS NEEDED FOR WHEEZING OR SHORTNESS OF BREATH 8.5 g 2  . alprazolam (XANAX) 2 MG tablet Take 2 mg by mouth every 6 (six) hours as needed for anxiety.     Marland Kitchen amiodarone (PACERONE) 200 MG tablet TAKE 1 TABLET(200 MG) BY MOUTH DAILY 30 tablet 7  . amLODipine (NORVASC) 10 MG tablet TAKE 1 TABLET BY MOUTH EVERY DAY 30 tablet 6  . aspirin 81 MG EC tablet Take 81 mg by mouth at bedtime.     . carvedilol (COREG) 12.5 MG tablet TAKE 1 TABLET(12.5 MG) BY MOUTH TWICE DAILY WITH A MEAL 180 tablet 3  . cycloSPORINE (RESTASIS) 0.05 % ophthalmic emulsion Place 1 drop into both eyes 2 (two) times daily.    Marland Kitchen  DULoxetine (CYMBALTA) 60 MG capsule Take 60 mg by mouth daily.    Marland Kitchen entecavir (BARACLUDE) 0.5 MG tablet Take 1 tablet (0.5 mg total) by mouth every other day. Please note: Every OTHER day 90 tablet 1  . ezetimibe (ZETIA) 10 MG tablet Take 1 tablet (10 mg total) by mouth daily. 30 tablet 11  . ferrous sulfate 325 (65 FE) MG tablet Take 325 mg by mouth daily with breakfast.    . hydrALAZINE (APRESOLINE) 25 MG tablet Take 25 mg by mouth 2 (two) times a day.    . mometasone (NASONEX) 50 MCG/ACT nasal spray SHAKE LIQUID AND USE 2 SPRAYS IN  EACH NOSTRIL DAILY 17 g 1  . ondansetron (ZOFRAN) 4 MG tablet Take 4 mg by mouth 2 (two) times daily as needed for nausea or vomiting.   0  . Rivaroxaban (XARELTO) 15 MG TABS tablet Take 1 tablet (15 mg total) by mouth daily with supper. 90 tablet 3  . sildenafil (REVATIO) 20 MG tablet TAKE 2-5 TABLETS BY MOUTH DAILY AS NEEDED PRIOR TO SEXUAL ACTIVITY (Patient taking differently: Take 40-100 mg by mouth daily as needed (erectile dysfunction). ) 180 tablet 3  . SYMBICORT 160-4.5 MCG/ACT inhaler INHALE 2 PUFFS INTO THE LUNGS TWICE DAILY 10.2 g 1   No current facility-administered medications on file prior to visit.     Allergies  Allergen Reactions  . Codeine Itching, Nausea And Vomiting and Hives  . Hydrocodone-Acetaminophen Itching and Nausea And Vomiting    Social History   Socioeconomic History  . Marital status: Divorced    Spouse name: Not on file  . Number of children: 2  . Years of education: Not on file  . Highest education level: Not on file  Occupational History  . 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 Financial resource strain: Not on file  . Food insecurity    Worry: Not on file    Inability: Not on file  . Transportation needs    Medical: Not on file    Non-medical: Not on file  Tobacco Use  . Smoking status: Former Smoker    Packs/day: 0.50    Years: 0.50    Pack years: 0.25    Types: Cigarettes    Quit date: 05/02/2010    Years since quitting: 8.6  . Smokeless tobacco: Never Used  . Tobacco comment: Significant Second-hand and only 3 months himself  Substance and Sexual Activity  . Alcohol use: Not Currently    Alcohol/week: 0.0 standard drinks    Comment: rare  . Drug use: No    Comment: Reported history of cocaine abuse off and on   . Sexual activity: Not on file  Lifestyle  . Physical activity    Days per week: Not on file    Minutes per session: Not on file  . Stress: Not on  file  Relationships  . Social Herbalist on phone: Not on file    Gets together: Not on file    Attends religious service: Not on file    Active member of club or organization: Not on file    Attends meetings of clubs or organizations: Not on file    Relationship status: Not on file  . Intimate partner violence    Fear of current or ex partner: Not on file    Emotionally abused: Not on file    Physically abused: Not  on file    Forced sexual activity: Not on file  Other Topics Concern  . Not on file  Social History Narrative   Living with a son who is a 54 year old.  He is not     working, unemployed for 3 years.  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  Denied use of alcohol.  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       [facility]:   Originally from [place], [place]. He grew up in [place]. He moved to [place] in 1985. 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 No pets currently. No bird exposure. He did have mold in a previous home. No hot tub exposure.          Family History  Problem Relation Age of Onset  . Breast cancer Mother   . Multiple myeloma Mother   . Prostate cancer Father   . Diabetes Father   . Peripheral vascular disease Father   . Cerebral palsy Daughter   . Lung disease Neg Hx   . Colon cancer Neg Hx   . Stomach cancer Neg Hx   . Esophageal cancer Neg Hx   . Colon polyps Neg Hx     Past Surgical History:  Procedure Laterality Date  . CARDIOVERSION N/A 01/07/2014   Procedure: CARDIOVERSION;  Surgeon: Lelon Perla, MD;  Location: [facility] ENDOSCOPY;  Service: Cardiovascular;  Laterality: N/A;  . CARDIOVERSION N/A 02/11/2014   Procedure: CARDIOVERSION;  Surgeon: Pixie Casino, MD;  Location: [place];  Service: Cardiovascular;  Laterality: N/A;  . COLONOSCOPY WITH PROPOFOL N/A 02/06/2018   Procedure: COLONOSCOPY WITH PROPOFOL;  Surgeon: Yetta Flock, MD;   [facility] ENDOSCOPY;  Service: Gastroenterology;  Laterality: N/A;  . LASEK eye surgery Bilateral 05/2018  . PACEMAKER INSERTION    . POLYPECTOMY  02/06/2018   Procedure: POLYPECTOMY;  Surgeon: Yetta Flock, MD;  Location: [facility] ENDOSCOPY;  Service: Gastroenterology;;  . TEE WITHOUT CARDIOVERSION N/A 01/07/2014   Procedure: TRANSESOPHAGEAL ECHOCARDIOGRAM (TEE);  Surgeon: Lelon Perla, MD;  Location[facility]OSCOPY;  Service: Cardiovascular;  Laterality: N/A;  . TEE WITHOUT CARDIOVERSION N/A 02/11/2014   Procedure: TRANSESOPHAGEAL ECHOCARDIOGRAM (TEE);  Surgeon: Pixie Casino, MD;  Location: [facility] ENDOSCOPY;  Service: Cardiovascular;  Laterality: N/A;  . TONSILLECTOMY    . TRANSTHORACIC ECHOCARDIOGRAM  10/2006, 12/2009    ROS: Review of Systems Negative except as stated above  PHYSICAL EXAM: BP 129/75   Pulse (!) 53   Temp 98.7 F (37.1 C) (Oral)   Resp 16   Wt 263 lb 9.6 oz (119.6 kg)   SpO2 96%   BMI 32.95 kg/m   Wt Readings from Last 3 Encounters:  12/31/18 263 lb 9.6 oz (119.6 kg)  10/18/18 259 lb (117.5 kg)  10/12/18 259 lb (117.5 kg)    Physical Exam  General appearance - alert, well appearing, and in no distress Mental status - normal mood, behavior, speech, dress, motor activity, and thought processes Ears - bilateral TM's and external ear canals normal Mouth - mucous membranes moist, pharynx normal without lesions.  Several cavities Neck - supple, no significant adenopathy, bilateral symmetric anterior adenopathy Chest - clear to auscultation, no wheezes, rales or rhonchi, symmetric air entry Heart - normal rate, regular rhythm, normal S1, S2, no murmurs, rubs, clicks or gallops Extremities - peripheral pulses normal, no pedal edema, no clubbing or cyanosis Skin -  small scatter bruising on forearms  CMP Latest Ref Rng & Units 10/24/2018 10/05/2018 10/05/2018  Glucose 65 - 99 mg/dL 93 - 98  BUN 6 - 24 mg/dL 30(H) - 27(H)  Creatinine 0.76 - 1.27 mg/dL 2.06(H) -  2.21(H)  Sodium 134 - 144 mmol/L 140 - 137  Potassium 3.5 - 5.2 mmol/L 3.9 - 3.5  Chloride 96 - 106 mmol/L 102 - 103  CO2 20 - 29 mmol/L 22 - 24  Calcium 8.7 - 10.2 mg/dL 9.7 - 9.5  Total Protein 6.0 - 8.5 g/dL - 6.8 7.1  Total Bilirubin 0.0 - 1.2 mg/dL - 0.7 0.9  Alkaline Phos 39 - 117 IU/L - 69 66  AST 0 - 40 IU/L - 19 17  ALT 0 - 44 IU/L - 15 18   Lipid Panel     Component Value Date/Time   CHOL 180 07/09/2018 1527   TRIG 118 07/09/2018 1527   HDL 40 07/09/2018 1527   CHOLHDL 4.5 07/09/2018 1527   CHOLHDL 3.9 10/12/2015 1026   VLDL 27 10/12/2015 1026   LDLCALC 116 (H) 07/09/2018 1527    CBC    Component Value Date/Time   WBC 7.0 07/09/2018 1527   WBC 14.3 (H) 05/20/2017 1143   RBC 5.18 07/09/2018 1527   RBC 5.19 05/20/2017 1143   HGB 16.4 07/09/2018 1527   HCT 47.9 07/09/2018 1527   PLT 185 07/09/2018 1527   MCV 93 07/09/2018 1527   MCH 31.7 07/09/2018 1527   MCH 33.3 05/20/2017 1143   MCHC 34.2 07/09/2018 1527   MCHC 36.0 05/20/2017 1143   RDW 13.5 07/09/2018 1527   LYMPHSABS 1.6 05/30/2017 1430   MONOABS 820 01/26/2016 1025   EOSABS 0.1 05/30/2017 1430   BASOSABS 0.0 05/30/2017 1430    ASSESSMENT AND PLAN: 1. CKD (chronic kidney disease) stage 3, GFR 30-59 ml/min (HCC) Discussed dx with him.  Dx is not new but we do need to keep eye on function and avoid meds that may harm the kidneys  2. Influenza vaccination declined   3. Prostate cancer screening - PSA; Future  4. Weak urinary stream Pt decline rectal exam but agreeable to trying flomax.  Told to avoid drinking caffeinated beverages in excess - tamsulosin (FLOMAX) 0.4 MG CAPS capsule; Take 1 capsule (0.4 mg total) by mouth daily.  Dispense: 30 capsule; Refill: 3 - PSA; Future  5. Dental caries - Ambulatory referral to Dentistry  6. OSA (obstructive sleep apnea) Declines referral back t sleep specialist  7. Decreased hearing of both ears Declines referral back to ENT for hearing eval Avoid  loud noisy environments without protection  8. Chronic systolic heart failure (HCC) 9. Coronary artery disease involving native coronary artery of native heart without angina pectoris Stable.  Followed by cardiology   Patient was given the opportunity to ask questions.  Patient verbalized understanding of the plan and was able to repeat key elements of the plan.   No orders of the defined types were placed in this encounter.    Requested Prescriptions    No prescriptions requested or ordered in this encounter    No follow-ups on file.  Karle Plumber, MD, FACP

## 2019-01-02 ENCOUNTER — Other Ambulatory Visit: Payer: Medicaid Other | Admitting: *Deleted

## 2019-01-02 DIAGNOSIS — R3912 Poor urinary stream: Secondary | ICD-10-CM | POA: Diagnosis not present

## 2019-01-02 DIAGNOSIS — Z125 Encounter for screening for malignant neoplasm of prostate: Secondary | ICD-10-CM | POA: Diagnosis not present

## 2019-01-03 LAB — PSA: Prostate Specific Ag, Serum: 3.3 ng/mL (ref 0.0–4.0)

## 2019-01-09 ENCOUNTER — Ambulatory Visit (HOSPITAL_COMMUNITY)
Admission: RE | Admit: 2019-01-09 | Discharge: 2019-01-09 | Disposition: A | Discharge status: Home / Self Care | Payer: Medicaid Other | Source: Ambulatory Visit | Attending: Gastroenterology | Admitting: Gastroenterology

## 2019-01-09 ENCOUNTER — Other Ambulatory Visit: Payer: Self-pay

## 2019-01-09 DIAGNOSIS — B181 Chronic viral hepatitis B without delta-agent: Secondary | ICD-10-CM | POA: Diagnosis not present

## 2019-01-09 DIAGNOSIS — K7460 Unspecified cirrhosis of liver: Secondary | ICD-10-CM

## 2019-01-11 ENCOUNTER — Telehealth: Payer: Self-pay

## 2019-01-11 NOTE — Telephone Encounter (Signed)
Contacted pt to go over lab results pt is aware and doesn't have any questions or concerns 

## 2019-01-21 DIAGNOSIS — F330 Major depressive disorder, recurrent, mild: Secondary | ICD-10-CM | POA: Diagnosis not present

## 2019-01-21 DIAGNOSIS — F639 Impulse disorder, unspecified: Secondary | ICD-10-CM | POA: Diagnosis not present

## 2019-01-21 DIAGNOSIS — F410 Panic disorder [episodic paroxysmal anxiety] without agoraphobia: Secondary | ICD-10-CM | POA: Diagnosis not present

## 2019-01-25 ENCOUNTER — Telehealth: Payer: Self-pay

## 2019-01-25 NOTE — Telephone Encounter (Signed)
Called to arrange 6 mo echo and visit with Dr. Burt Knack (due 04/13/2019).  Left message to call back.

## 2019-01-27 ENCOUNTER — Other Ambulatory Visit: Payer: Self-pay | Admitting: Cardiovascular Disease

## 2019-01-27 ENCOUNTER — Other Ambulatory Visit: Payer: Self-pay | Admitting: Emergency Medicine

## 2019-01-27 DIAGNOSIS — R0602 Shortness of breath: Secondary | ICD-10-CM

## 2019-01-27 DIAGNOSIS — R059 Cough, unspecified: Secondary | ICD-10-CM

## 2019-01-27 DIAGNOSIS — I5022 Chronic systolic (congestive) heart failure: Secondary | ICD-10-CM

## 2019-01-27 DIAGNOSIS — R05 Cough: Secondary | ICD-10-CM

## 2019-01-27 DIAGNOSIS — J302 Other seasonal allergic rhinitis: Secondary | ICD-10-CM

## 2019-01-28 MED ORDER — RIVAROXABAN 20 MG PO TABS: 20.0000 mg | ORAL_TABLET | Freq: Every day | ORAL | 1 refills | Status: DC

## 2019-01-28 NOTE — Telephone Encounter (Signed)
Pt last saw Dr Burt Knack 10/12/18 telemedicine Covid-19, last labs 10/24/18 Creat 2.06, age 58, weight 119.6kg, CrCl 67.73, pt is not on appropriate dosage of Xarelto. Will send message to Dr Burt Knack to see if dosage adjustment appropriate.

## 2019-01-28 NOTE — Telephone Encounter (Signed)
Called spoke with pt made aware of dosage change from Xarelto 15mg  QD to Xarelto 20mg  QD given CrCl 67.73.  Rx sent to pharmacy.  Pt verbalized understanding.

## 2019-01-28 NOTE — Telephone Encounter (Signed)
Scheduled the patient 12/10 for echocardiogram and 12/14 for follow-up appointment with Dr. Burt Knack.  He was grateful for call and agrees with treatment plan.

## 2019-01-28 NOTE — Telephone Encounter (Signed)
Yes I'd switch him back to 20 mg if that's the proper dose for him. thanks

## 2019-01-30 ENCOUNTER — Telehealth: Payer: Self-pay | Admitting: Cardiovascular Disease

## 2019-01-30 NOTE — Telephone Encounter (Signed)
[facility] pharmacy wanted to know if Dr. Burt Knack is aware that the medication Amodarone can cause significant changes in thyroid function. Pharmacy states that there is no medications on pt's profile. Pharmacy wanting to know if pt's thyroid function is being monitored? Please address

## 2019-01-30 NOTE — Telephone Encounter (Signed)
Informed pharmacist appropriate labs are followed. He was grateful for follow-up.

## 2019-01-31 ENCOUNTER — Other Ambulatory Visit: Payer: Self-pay | Admitting: Emergency Medicine

## 2019-01-31 DIAGNOSIS — J302 Other seasonal allergic rhinitis: Secondary | ICD-10-CM

## 2019-03-18 ENCOUNTER — Telehealth: Payer: Self-pay

## 2019-03-18 NOTE — Telephone Encounter (Signed)
-----   Message from Hughie Closs, RN sent at 01/11/2019 10:30 AM EDT ----- Schedule F/U O.V. in Dec.

## 2019-03-18 NOTE — Telephone Encounter (Signed)
Left message to please call back to schedule 6 month follow-up office visit with Dr. Havery Moros

## 2019-03-21 ENCOUNTER — Other Ambulatory Visit: Payer: Self-pay | Admitting: Emergency Medicine

## 2019-04-11 ENCOUNTER — Encounter (INDEPENDENT_AMBULATORY_CARE_PROVIDER_SITE_OTHER): Payer: Self-pay

## 2019-04-11 ENCOUNTER — Other Ambulatory Visit: Payer: Self-pay

## 2019-04-11 ENCOUNTER — Ambulatory Visit (HOSPITAL_COMMUNITY): Payer: Medicaid Other | Attending: Cardiology

## 2019-04-11 DIAGNOSIS — I5022 Chronic systolic (congestive) heart failure: Secondary | ICD-10-CM | POA: Insufficient documentation

## 2019-04-15 ENCOUNTER — Encounter: Payer: Self-pay | Admitting: Cardiovascular Disease

## 2019-04-15 ENCOUNTER — Ambulatory Visit: Payer: Medicaid Other | Admitting: Cardiovascular Disease

## 2019-04-15 ENCOUNTER — Other Ambulatory Visit: Payer: Self-pay

## 2019-04-15 VITALS — BP 156/98 | HR 54 | Ht 75.0 in | Wt 274.0 lb

## 2019-04-15 DIAGNOSIS — I2510 Atherosclerotic heart disease of native coronary artery without angina pectoris: Secondary | ICD-10-CM | POA: Diagnosis not present

## 2019-04-15 DIAGNOSIS — N184 Chronic kidney disease, stage 4 (severe): Secondary | ICD-10-CM

## 2019-04-15 DIAGNOSIS — R748 Abnormal levels of other serum enzymes: Secondary | ICD-10-CM | POA: Diagnosis not present

## 2019-04-15 DIAGNOSIS — I5022 Chronic systolic (congestive) heart failure: Secondary | ICD-10-CM

## 2019-04-15 DIAGNOSIS — I480 Paroxysmal atrial fibrillation: Secondary | ICD-10-CM

## 2019-04-15 DIAGNOSIS — I10 Essential (primary) hypertension: Secondary | ICD-10-CM | POA: Diagnosis not present

## 2019-04-15 DIAGNOSIS — E782 Mixed hyperlipidemia: Secondary | ICD-10-CM | POA: Diagnosis not present

## 2019-04-15 MED ORDER — SILDENAFIL CITRATE 20 MG PO TABS: 40.0000 mg | ORAL_TABLET | Freq: Every day | ORAL | 0 refills | Status: DC | PRN

## 2019-04-15 MED ORDER — HYDRALAZINE HCL 50 MG PO TABS: 50.0000 mg | ORAL_TABLET | Freq: Two times a day (BID) | ORAL | 3 refills | Status: DC

## 2019-04-15 NOTE — Progress Notes (Signed)
Cardiology Office Note:    Date:  04/16/2019   ID:  [name], DOB Sep 29, 1960, MRN [id number]  PCP:  Ladell Pier, MD  Cardiologist:  [name], MD  Electrophysiologist:  None   Referring MD: Ladell Pier, MD   Chief Complaint  Patient presents with  . Shortness of Breath    History of Present Illness:    Cody Ortega is a 58 y.o. male with a hx of chronic systolic heart failure, paroxysmal atrial fibrillation, and coronary artery disease, presenting today for follow-up evaluation.  The patient is here alone today.  He has undergone ICD implantation and is followed by Dr. Lovena Le.  He still has not been as active as baseline because of the COVID-19 pandemic.  He used to swim and play volleyball as his main forms of physical exercise.  He has been under a lot of stress related to his son's substance abuse problem.  He notes that his blood pressure has not been as well controlled as in the past.  He has not had any recent chest pain or pressure.  He has stable symptoms of exertional dyspnea with moderate activity.  He complains of generalized fatigue.  He has not had any recent leg swelling, orthopnea, or PND.  He does wake up with a cough in the mornings.  Past Medical History:  Diagnosis Date  . Anxiety   . Arthritis   . Benzodiazepine dependence ([place])   . Bronchial asthma   . CAD (coronary artery disease)    a. Cath 03/2010: mod RCA stenosis, severe diag stenosis, treated medically given lack of angina.  . 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 Chronic systolic congestive heart failure (HCC)    Acute decompensation, LVEF less than 20%  . CKD (chronic kidney disease), stage IV (HCC)    Stage 3-4  . Depression   . Diverticulosis   . Hepatitis B   . History of cocaine abuse ([place])   . Hypertension   . Microcytic anemia   . PAF (paroxysmal atrial fibrillation) ([employer])    a. Episode in 2008 with spontaneous conversion  . S/P implantation of  automatic cardioverter/defibrillator (AICD)    a. Medtronic, implanted 2012.  . Sleep apnea   . Stroke [facility]) 2004  . Thyroid disease     Past Surgical History:  Procedure Laterality Date  . CARDIOVERSION N/A 01/07/2014   Procedure: CARDIOVERSION;  Surgeon: Lelon Perla, MD;  Location: [facility] ENDOSCOPY;  Service: Cardiovascular;  Laterality: N/A;  . CARDIOVERSION N/A 02/11/2014   Procedure: CARDIOVERSION;  Surgeon: Pixie Casino, MD;  Location: [place];  Service: Cardiovascular;  Laterality: N/A;  . COLONOSCOPY WITH PROPOFOL N/A 02/06/2018   Procedure: COLONOSCOPY WITH PROPOFOL;  Surgeon: Yetta Flock, MD;  Location: [facility] ENDOSCOPY;  Service: Gastroenterology;  Laterality: N/A;  . LASEK eye surgery Bilateral 05/2018  . PACEMAKER INSERTION    . POLYPECTOMY  02/06/2018   Procedure: POLYPECTOMY;  Surgeon: Yetta Flock, MD;  Location: [facility] ENDOSCOPY;  Service: Gastroenterology;;  . TEE WITHOUT CARDIOVERSION N/A 01/07/2014   Procedure: TRANSESOPHAGEAL ECHOCARDIOGRAM (TEE);  Surgeon: Lelon Perla, MD;  Location: [facility] ENDOSCOPY;  Service: Cardiovascular;  Laterality: N/A;  . TEE WITHOUT CARDIOVERSION N/A 02/11/2014   Procedure: TRANSESOPHAGEAL ECHOCARDIOGRAM (TEE);  Surgeon: Pixie Casino, MD;  Location: [facility] ENDOSCOPY;  Service: Cardiovascular;  Laterality: N/A;  . TONSILLECTOMY    . TRANSTHORACIC ECHOCARDIOGRAM  10/2006, 12/2009    Current Medications: Current Meds  Medication  Sig  . albuterol (VENTOLIN HFA) 108 (90 Base) MCG/ACT inhaler INHALE 2 PUFFS INTO THE LUNGS EVERY 6 HOURS AS NEEDED FOR WHEEZING OR SHORTNESS OF BREATH  . alprazolam (XANAX) 2 MG tablet Take 2 mg by mouth every 6 (six) hours as needed for anxiety.   Marland Kitchen amiodarone (PACERONE) 200 MG tablet TAKE 1 TABLET(200 MG) BY MOUTH DAILY  . amLODipine (NORVASC) 10 MG tablet TAKE 1 TABLET BY MOUTH EVERY DAY  . aspirin 81 MG EC tablet Take 81 mg by mouth at bedtime.   . carvedilol (COREG) 12.5 MG tablet TAKE 1  TABLET(12.5 MG) BY MOUTH TWICE DAILY WITH A MEAL  . DULoxetine (CYMBALTA) 60 MG capsule Take 60 mg by mouth daily.  Marland Kitchen entecavir (BARACLUDE) 0.5 MG tablet Take 1 tablet (0.5 mg total) by mouth every other day. Please note: Every OTHER day  . ezetimibe (ZETIA) 10 MG tablet Take 1 tablet (10 mg total) by mouth daily.  . ferrous sulfate 325 (65 FE) MG tablet Take 325 mg by mouth daily with breakfast.  . hydrALAZINE (APRESOLINE) 50 MG tablet Take 1 tablet (50 mg total) by mouth 2 (two) times daily.  . mometasone (NASONEX) 50 MCG/ACT nasal spray SHAKE LIQUID AND USE 2 SPRAYS IN EACH NOSTRIL DAILY  . ondansetron (ZOFRAN) 4 MG tablet Take 4 mg by mouth 2 (two) times daily as needed for nausea or vomiting.   . rivaroxaban (XARELTO) 20 MG TABS tablet Take 1 tablet (20 mg total) by mouth daily with supper.  . sildenafil (REVATIO) 20 MG tablet Take 2-5 tablets (40-100 mg total) by mouth daily as needed (erectile dysfunction).  . SYMBICORT 160-4.5 MCG/ACT inhaler INHALE 2 PUFFS INTO THE LUNGS TWICE DAILY  . tamsulosin (FLOMAX) 0.4 MG CAPS capsule Take 1 capsule (0.4 mg total) by mouth daily.  . [DISCONTINUED] hydrALAZINE (APRESOLINE) 25 MG tablet Take 25 mg by mouth 2 (two) times a day.  . [DISCONTINUED] sildenafil (REVATIO) 20 MG tablet TAKE 2-5 TABLETS BY MOUTH DAILY AS NEEDED PRIOR TO SEXUAL ACTIVITY (Patient taking differently: Take 40-100 mg by mouth daily as needed (erectile dysfunction). )     Allergies:   Codeine and Hydrocodone-acetaminophen   Social History   Socioeconomic History  . Marital status: Divorced    Spouse name: Not on file  . Number of children: 2  . Years of education: Not on file  . Highest education level: Not on file  Occupational History  . Occupation: Merchandiser, retail: UNEMPLOYED    Comment: Writer in the past  Tobacco Use  . Smoking status: Former Smoker    Packs/day: 0.50    Years: 0.50    Pack years: 0.25    Types: Cigarettes    Quit date:  05/02/2010    Years since quitting: 8.9  . Smokeless tobacco: Never Used  . Tobacco comment: Significant Second-hand and only 3 months himself  Substance and Sexual Activity  . Alcohol use: Not Currently    Alcohol/week: 0.0 standard drinks    Comment: rare  . Drug use: No    Comment: Reported history of cocaine abuse off and on   . Sexual activity: Not on file  Other Topics Concern  . Not on file  Social History Narrative   Living with a son who is a 63 year old.  He is not     working, unemployed for 3 years.  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  Denied use  of alcohol.  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       [facility]:   Originally from [place], [place]. He grew up in [place]. He moved to [place] in 1985. 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 No pets currently. No bird exposure. He did have mold in a previous home. No hot tub exposure.         Social Determinants of Health   Financial Resource Strain:   . Difficulty of Paying Living Expenses: Not on file  Food Insecurity:   . Worried About Charity fundraiser in the Last Year: Not on file  . Ran Out of Food in the Last Year: Not on file  Transportation Needs:   . Lack of Transportation (Medical): Not on file  . Lack of Transportation (Non-Medical): Not on file  Physical Activity:   . Days of Exercise per Week: Not on file  . Minutes of Exercise per Session: Not on file  Stress:   . Feeling of Stress : Not on file  Social Connections:   . Frequency of Communication with Friends and Family: Not on file  . Frequency of Social Gatherings with Friends and Family: Not on file  . Attends Religious Services: Not on file  . Active Member of Clubs or Organizations: Not on file  . Attends Archivist Meetings: Not on file  . Marital Status: Not on file     Family History: The patient's family history includes Breast cancer in  his mother; Cerebral palsy in his daughter; Diabetes in his father; Multiple myeloma in his mother; Peripheral vascular disease in his father; Prostate cancer in his father. There is no history of Lung disease, Colon cancer, Stomach cancer, Esophageal cancer, or Colon polyps.  ROS:   Please see the history of present illness.    All other systems reviewed and are negative.  EKGs/Labs/Other Studies Reviewed:    The following studies were reviewed today: Echo 04-11-2019: IMPRESSIONS    1. Left ventricular ejection fraction, by visual estimation, is 30 to 35%. The left ventricle has severely decreased function. Apex not well-visualized. There is moderately increased left ventricular hypertrophy.  2. The left ventricle demonstrates global hypokinesis.  3. The average left ventricular global longitudinal strain is -9.1 %.  4. Moderate to severely dilated left ventricular internal cavity size.  5. Left ventricular diastolic parameters are consistent with Grade II diastolic dysfunction (pseudonormalization).  6. Elevated left atrial pressure.  7. Global right ventricle has normal systolic function.The right ventricular size is normal. No increase in right ventricular wall thickness.  8. Left atrial size was severely dilated.  9. Right atrial size was normal. 10. Trivial pericardial effusion is present. 11. The mitral valve is normal in structure. No evidence of mitral valve regurgitation. 12. The tricuspid valve is normal in structure. Tricuspid valve regurgitation is not demonstrated. 13. The aortic valve is tricuspid. Aortic valve regurgitation is not visualized. No evidence of aortic valve sclerosis or stenosis. 14. The pulmonic valve was not well visualized. Pulmonic valve regurgitation is not visualized.  EKG:  EKG is not ordered today.    Recent Labs: 07/09/2018: Hemoglobin 16.4; Platelets 185; TSH 1.450 04/15/2019: ALT 10; BUN 25; Creatinine, Ser 1.80; Potassium 4.0; Sodium 142    Recent Lipid Panel    Component Value Date/Time   CHOL 180 07/09/2018 1527   TRIG 118 07/09/2018 1527   HDL 40 07/09/2018 1527   CHOLHDL  4.5 07/09/2018 1527   CHOLHDL 3.9 10/12/2015 1026   VLDL 27 10/12/2015 1026   LDLCALC 116 (H) 07/09/2018 1527    Physical Exam:    VS:  BP (!) 156/98   Pulse (!) 54   Ht '6\' 3"'$  (1.905 m)   Wt 274 lb (124.3 kg)   SpO2 96%   BMI 34.25 kg/m     Wt Readings from Last 3 Encounters:  04/15/19 274 lb (124.3 kg)  12/31/18 263 lb 9.6 oz (119.6 kg)  10/18/18 259 lb (117.5 kg)     GEN:  Well nourished, well developed in no acute distress HEENT: Normal NECK: No JVD; No carotid bruits LYMPHATICS: No lymphadenopathy CARDIAC: RRR, no murmurs, rubs, gallops RESPIRATORY:  Clear to auscultation without rales, wheezing or rhonchi  ABDOMEN: Soft, non-tender, non-distended MUSCULOSKELETAL:  No edema; No deformity  SKIN: Warm and dry NEUROLOGIC:  Alert and oriented x 3 PSYCHIATRIC:  Normal affect   ASSESSMENT:    1. Chronic systolic heart failure ([place])   2. PAF (paroxysmal atrial fibrillation) ([place])   3. Coronary artery disease involving native coronary artery of native heart without angina pectoris   4. Mixed hyperlipidemia   5. Chronic kidney disease, stage IV (severe) ([place])   6. Essential hypertension, malignant   7. Elevated liver enzymes    PLAN:    In order of problems listed above:  1. Stable New [place] Heart Association functional class II symptoms.  I reviewed his medical program.  I recommended that he increase hydralazine to 50 mg twice daily.  He has not been compliant with 3 times daily dosing in the past.  He will continue his other medications without change.  Reviewed recent echo, LVEF 30 to 35%.  This is stable from previous.  There is no significant valvular disease. 2. Maintaining sinus rhythm on amiodarone.  Most recent labs reviewed.  Needs to continue with surveillance liver and thyroid function testing.  Continue oral  anticoagulation with rivaroxaban.  Check renal panel and LFTs. 3. No anginal symptoms on current regimen. 4. Lifestyle modification discussed.  Continue statin therapy.  Reviewed most recent lipid panel. 5. Metabolic panel to be drawn.  Unable to tolerate ACE/ARB. 6. Blood pressure with suboptimal control.  Increase hydralazine as above. 7. Repeat LFTs today.  Patient remains on amiodarone.   Medication Adjustments/Labs and Tests Ordered: Current medicines are reviewed at length with the patient today.  Concerns regarding medicines are outlined above.  Orders Placed This Encounter  Procedures  . Comp Met (CMET)   Meds ordered this encounter  Medications  . hydrALAZINE (APRESOLINE) 50 MG tablet    Sig: Take 1 tablet (50 mg total) by mouth 2 (two) times daily.    Dispense:  180 tablet    Refill:  3  . sildenafil (REVATIO) 20 MG tablet    Sig: Take 2-5 tablets (40-100 mg total) by mouth daily as needed (erectile dysfunction).    Dispense:  100 tablet    Refill:  0    Patient Instructions  Medication Instructions:  1) INCREASE HYDRALAZINE to 50 mg twice daily *If you need a refill on your cardiac medications before your next appointment, please call your pharmacy*  Lab Work: TODAY: BMET If you have labs (blood work) drawn today and your tests are completely normal, you will receive your results only by: Marland Kitchen MyChart Message (if you have MyChart) OR . A paper copy in the mail If you have any lab test that is abnormal or we  need to change your treatment, we will call you to review the results.  Follow-Up: At [facility], you and your health needs are our priority.  As part of our continuing mission to provide you with exceptional heart care, we have created designated Provider Care Teams.  These Care Teams include your primary Cardiologist (physician) and Advanced Practice Providers (APPs -  Physician Assistants and Nurse Practitioners) who all work together to provide you with the  care you need, when you need it. Your next appointment:   6 month(s) The format for your next appointment:   In Person Provider:   You may see [name], MD or one of the following Advanced Practice Providers on your designated Care Team:    Richardson Dopp, PA-C  Vin [place]od[place], PA-C  Daune Perch, [place]    Signed, [name], MD  04/16/2019 9:44 AM    [place]

## 2019-04-15 NOTE — Patient Instructions (Signed)
Medication Instructions:  1) INCREASE HYDRALAZINE to 50 mg twice daily *If you need a refill on your cardiac medications before your next appointment, please call your pharmacy*  Lab Work: TODAY: BMET If you have labs (blood work) drawn today and your tests are completely normal, you will receive your results only by: Marland Kitchen MyChart Message (if you have MyChart) OR . A paper copy in the mail If you have any lab test that is abnormal or we need to change your treatment, we will call you to review the results.  Follow-Up: At [facility], you and your health needs are our priority.  As part of our continuing mission to provide you with exceptional heart care, we have created designated Provider Care Teams.  These Care Teams include your primary Cardiologist (physician) and Advanced Practice Providers (APPs -  Physician Assistants and Nurse Practitioners) who all work together to provide you with the care you need, when you need it. Your next appointment:   6 month(s) The format for your next appointment:   In Person Provider:   You may see [name], MD or one of the following Advanced Practice Providers on your designated Care Team:    Richardson Dopp, PA-C  Vin Ell[place], [place]  Daune Perch, [place]

## 2019-04-16 ENCOUNTER — Encounter: Payer: Self-pay | Admitting: Cardiovascular Disease

## 2019-04-16 LAB — COMPREHENSIVE METABOLIC PANEL
ALT: 10 IU/L (ref 0–44)
AST: 15 IU/L (ref 0–40)
Albumin/Globulin Ratio: 1.6 (ref 1.2–2.2)
Albumin: 4.3 g/dL (ref 3.8–4.9)
Alkaline Phosphatase: 87 IU/L (ref 39–117)
BUN/Creatinine Ratio: 14 (ref 9–20)
BUN: 25 mg/dL — ABNORMAL HIGH (ref 6–24)
Bilirubin Total: 0.3 mg/dL (ref 0.0–1.2)
CO2: 25 mmol/L (ref 20–29)
Calcium: 9.7 mg/dL (ref 8.7–10.2)
Chloride: 102 mmol/L (ref 96–106)
Creatinine, Ser: 1.8 mg/dL — ABNORMAL HIGH (ref 0.76–1.27)
GFR calc Af Amer: 48 mL/min/{1.73_m2} — ABNORMAL LOW (ref >59)
GFR calc non Af Amer: 41 mL/min/{1.73_m2} — ABNORMAL LOW (ref >59)
Globulin, Total: 2.7 g/dL (ref 1.5–4.5)
Glucose: 96 mg/dL (ref 65–99)
Potassium: 4 mmol/L (ref 3.5–5.2)
Sodium: 142 mmol/L (ref 134–144)
Total Protein: 7 g/dL (ref 6.0–8.5)

## 2019-04-30 ENCOUNTER — Telehealth: Payer: Self-pay | Admitting: Gastroenterology

## 2019-04-30 ENCOUNTER — Ambulatory Visit: Payer: Medicaid Other | Admitting: Gastroenterology

## 2019-04-30 ENCOUNTER — Ambulatory Visit: Payer: Medicaid Other | Admitting: Internal Medicine

## 2019-04-30 ENCOUNTER — Other Ambulatory Visit: Payer: Self-pay

## 2019-04-30 DIAGNOSIS — K7460 Unspecified cirrhosis of liver: Secondary | ICD-10-CM

## 2019-04-30 NOTE — Telephone Encounter (Signed)
He needs hepatitis B DNA level, hepatitis B E antigen, and AFP level. Can be done in January prior to our visit. Thanks

## 2019-04-30 NOTE — Telephone Encounter (Signed)
Order in Epic for labs to be drawn at patient's cardiologist. Patient also wanted a written copy of labs to be drawn. Put it at the front desk for pick-up

## 2019-04-30 NOTE — Telephone Encounter (Signed)
Patient rescheduled his office visit to 06/10/19. Called wanting to know if he needs any labs done before than

## 2019-04-30 NOTE — Telephone Encounter (Signed)
Patient called to reschedule his follow up appt. Is asking if he would need to do any labs prior to coming in on 06/10/19 from cardio.

## 2019-05-02 ENCOUNTER — Other Ambulatory Visit: Payer: Self-pay

## 2019-05-02 MED ORDER — CARVEDILOL 12.5 MG PO TABS: ORAL_TABLET | ORAL | 3 refills | Status: DC

## 2019-05-06 ENCOUNTER — Other Ambulatory Visit: Payer: Self-pay | Admitting: Internal Medicine

## 2019-05-06 ENCOUNTER — Other Ambulatory Visit: Payer: Self-pay

## 2019-05-06 ENCOUNTER — Encounter: Payer: Self-pay | Admitting: Internal Medicine

## 2019-05-06 ENCOUNTER — Ambulatory Visit: Payer: Medicaid Other | Attending: Internal Medicine | Admitting: Internal Medicine

## 2019-05-06 DIAGNOSIS — R3912 Poor urinary stream: Secondary | ICD-10-CM

## 2019-05-06 DIAGNOSIS — M25512 Pain in left shoulder: Secondary | ICD-10-CM

## 2019-05-06 DIAGNOSIS — J302 Other seasonal allergic rhinitis: Secondary | ICD-10-CM | POA: Diagnosis not present

## 2019-05-06 DIAGNOSIS — G8929 Other chronic pain: Secondary | ICD-10-CM

## 2019-05-06 DIAGNOSIS — J449 Chronic obstructive pulmonary disease, unspecified: Secondary | ICD-10-CM

## 2019-05-06 DIAGNOSIS — K7460 Unspecified cirrhosis of liver: Secondary | ICD-10-CM

## 2019-05-06 DIAGNOSIS — I5022 Chronic systolic (congestive) heart failure: Secondary | ICD-10-CM | POA: Diagnosis not present

## 2019-05-06 DIAGNOSIS — N1832 Chronic kidney disease, stage 3b: Secondary | ICD-10-CM | POA: Diagnosis not present

## 2019-05-06 DIAGNOSIS — B1910 Unspecified viral hepatitis B without hepatic coma: Secondary | ICD-10-CM | POA: Diagnosis not present

## 2019-05-06 DIAGNOSIS — I2510 Atherosclerotic heart disease of native coronary artery without angina pectoris: Secondary | ICD-10-CM

## 2019-05-06 DIAGNOSIS — I480 Paroxysmal atrial fibrillation: Secondary | ICD-10-CM | POA: Diagnosis not present

## 2019-05-06 MED ORDER — MOMETASONE FUROATE 50 MCG/ACT NA SUSP: 1.0000 | Freq: Every day | NASAL | 2 refills | Status: DC | PRN

## 2019-05-06 NOTE — Progress Notes (Signed)
Virtual Visit via Telephone Note Due to current restrictions/limitations of in-office visits due to the COVID-19 pandemic, this scheduled clinical appointment was converted to a telehealth visit  I connected with Cody Ortega on 05/06/19 at 4:04 p.m by telephone and verified that I am speaking with the correct person using two identifiers. I am in my office.  The patient is at home.  Only the patient and myself participated in this encounter.  I discussed the limitations, risks, security and privacy concerns of performing an evaluation and management service by telephone and the availability of in person appointments. I also discussed with the patient that there may be a patient responsible charge related to this service. The patient expressed understanding and agreed to proceed.   History of Present Illness: The patient with history of COPD and asthma, HTN, CKD stage III, CAD, andNICM with chronic systolic CHF and ICD, PAF,immune active hep B,adenomatous polyps,dep/anx(followed by psychiatrist, Noemi Chapel), OSA (intolerant of CPAP mask).  Last seen 12/2018.  Purpose of today's visit is chronic disease management.  Reports increase stress over holidays dealing with his son who is dealing with drug abuse issues. Reports increase dep/anx due to this. Still sees his psychiatrist  Still has frequent urination in the mornings over a 3 hr period.  Sleeps well through the nights.  Flomax helped for first 1 wk.  Would like to see urologist  C/o severe pain in LT shoulder.  Gives hx of OA in this shoulder. Pain chronic for over 2 yrs.   Worse when he wakes up in mornings and throughout the day.  Would like to hold off on seeing ortho.  Thinks he would be told that he needs steroid inj and does not want one at this time. -uses [employer] which he finds helpful  NICM, CAD, PAF, HTN: no CP, SOB. LE edema Checks BP regularly. Recently 120/80+, attributes slight increase in diastolic pressure to  stress from dealing with his son.   Compliant with taking his blood pressure medications including carvedilol, amlodipine, hydralazine.  Compliant with Zetia  Reports dry mouth because he sleeps with mouth open.  Requests refill on Nasonex nasal spray which he takes for allergy symptoms and drainage  CKD 3: recent blood test reveals kidney function is stable  Chronic hep B/Cirrhosis: Followed by Dr. Havery Moros.  Currently receiving treatment  COPD:  Taking Symbicort.  Really has to use Albuterol.  Would like to get Pneumonia vaccine   Outpatient Encounter Medications as of 05/06/2019  Medication Sig  . albuterol (VENTOLIN HFA) 108 (90 Base) MCG/ACT inhaler INHALE 2 PUFFS INTO THE LUNGS EVERY 6 HOURS AS NEEDED FOR WHEEZING OR SHORTNESS OF BREATH  . alprazolam (XANAX) 2 MG tablet Take 2 mg by mouth every 6 (six) hours as needed for anxiety.   Marland Kitchen amiodarone (PACERONE) 200 MG tablet TAKE 1 TABLET(200 MG) BY MOUTH DAILY  . amLODipine (NORVASC) 10 MG tablet TAKE 1 TABLET BY MOUTH EVERY DAY  . aspirin 81 MG EC tablet Take 81 mg by mouth at bedtime.   . carvedilol (COREG) 12.5 MG tablet TAKE 1 TABLET(12.5 MG) BY MOUTH TWICE DAILY WITH A MEAL  . DULoxetine (CYMBALTA) 60 MG capsule Take 60 mg by mouth daily.  Marland Kitchen entecavir (BARACLUDE) 0.5 MG tablet Take 1 tablet (0.5 mg total) by mouth every other day. Please note: Every OTHER day  . ezetimibe (ZETIA) 10 MG tablet Take 1 tablet (10 mg total) by mouth daily.  . ferrous sulfate 325 (65 FE) MG  tablet Take 325 mg by mouth daily with breakfast.  . hydrALAZINE (APRESOLINE) 50 MG tablet Take 1 tablet (50 mg total) by mouth 2 (two) times daily.  . mometasone (NASONEX) 50 MCG/ACT nasal spray SHAKE LIQUID AND USE 2 SPRAYS IN EACH NOSTRIL DAILY  . ondansetron (ZOFRAN) 4 MG tablet Take 4 mg by mouth 2 (two) times daily as needed for nausea or vomiting.   . rivaroxaban (XARELTO) 20 MG TABS tablet Take 1 tablet (20 mg total) by mouth daily with supper.  . sildenafil  (REVATIO) 20 MG tablet Take 2-5 tablets (40-100 mg total) by mouth daily as needed (erectile dysfunction).  . SYMBICORT 160-4.5 MCG/ACT inhaler INHALE 2 PUFFS INTO THE LUNGS TWICE DAILY   No facility-administered encounter medications on file as of 05/06/2019.    Observations/Objective:   Chemistry      Component Value Date/Time   NA 142 04/15/2019 1430   K 4.0 04/15/2019 1430   CL 102 04/15/2019 1430   CO2 25 04/15/2019 1430   BUN 25 (H) 04/15/2019 1430   CREATININE 1.80 (H) 04/15/2019 1430   CREATININE 1.38 (H) 02/08/2016 1155      Component Value Date/Time   CALCIUM 9.7 04/15/2019 1430   ALKPHOS 87 04/15/2019 1430   AST 15 04/15/2019 1430   ALT 10 04/15/2019 1430   BILITOT 0.3 04/15/2019 1430     Lab Results  Component Value Date   PSA1 3.3 01/02/2019      Assessment and Plan: 1. Seasonal allergies - mometasone (NASONEX) 50 MCG/ACT nasal spray; Place 1 spray into the nose daily as needed.  Dispense: 17 g; Refill: 2  2. Stage 3b chronic kidney disease Stable.  Avoid NSAIDs  3. Weak urinary stream Did not have sustained improvement with Flomax.  Will refer to urology - Ambulatory referral to Urology  4. Coronary artery disease involving native coronary artery of native heart without angina pectoris 5. Chronic systolic heart failure (HCC) Clinically stable on current medications.  Continue current medicines and low-salt diet.  6. 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 Havery Moros.  7. COPD with asthma ([place]) Stable on Symbicort  8. PAF (paroxysmal atrial fibrillation) (HCC) Clinically stable on Xarelto  9. Chronic left shoulder pain We will get an x-ray of the left shoulder and further management will be based on results - DG Shoulder Left; Future   Follow Up Instructions: 3 to 4 months   I discussed the assessment and treatment plan with the patient. The patient was provided an opportunity to ask questions and all  were answered. The patient agreed with the plan and demonstrated an understanding of the instructions.   The patient was advised to call back or seek an in-person evaluation if the symptoms worsen or if the condition fails to improve as anticipated.  I provided 17 minutes of non-face-to-face time during this encounter.   Karle Plumber, MD

## 2019-05-06 NOTE — Progress Notes (Signed)
Pt states he is having back and b/l knee discomfort

## 2019-05-14 ENCOUNTER — Ambulatory Visit: Payer: Medicaid Other | Attending: Internal Medicine | Admitting: Pharmacist

## 2019-05-14 ENCOUNTER — Other Ambulatory Visit: Payer: Self-pay

## 2019-05-14 DIAGNOSIS — Z23 Encounter for immunization: Secondary | ICD-10-CM | POA: Diagnosis not present

## 2019-05-14 NOTE — Progress Notes (Signed)
Patient presents for vaccination against strep pneumo per orders of Dr. Johnson. Consent given. Counseling provided. No contraindications exists. Vaccine administered without incident.   

## 2019-05-16 ENCOUNTER — Other Ambulatory Visit: Payer: Self-pay | Admitting: *Deleted

## 2019-05-16 ENCOUNTER — Ambulatory Visit (HOSPITAL_COMMUNITY)
Admission: RE | Admit: 2019-05-16 | Discharge: 2019-05-16 | Disposition: A | Discharge status: Home / Self Care | Payer: Medicaid Other | Source: Ambulatory Visit | Attending: Internal Medicine | Admitting: Internal Medicine

## 2019-05-16 ENCOUNTER — Other Ambulatory Visit: Payer: Self-pay

## 2019-05-16 ENCOUNTER — Other Ambulatory Visit: Payer: Medicaid Other | Admitting: *Deleted

## 2019-05-16 DIAGNOSIS — G8929 Other chronic pain: Secondary | ICD-10-CM | POA: Diagnosis not present

## 2019-05-16 DIAGNOSIS — K7460 Unspecified cirrhosis of liver: Secondary | ICD-10-CM

## 2019-05-16 DIAGNOSIS — M25512 Pain in left shoulder: Secondary | ICD-10-CM | POA: Insufficient documentation

## 2019-05-16 DIAGNOSIS — M19012 Primary osteoarthritis, left shoulder: Secondary | ICD-10-CM | POA: Diagnosis not present

## 2019-05-20 LAB — HEPATITIS B E ANTIGEN: Hep B E Ag: POSITIVE — AB

## 2019-05-20 LAB — AFP TUMOR MARKER: AFP, Serum, Tumor Marker: 2.1 ng/mL (ref 0.0–8.3)

## 2019-05-20 LAB — HEPATITIS B DNA, ULTRAQUANTITATIVE, PCR

## 2019-05-28 ENCOUNTER — Telehealth: Payer: Self-pay

## 2019-05-28 NOTE — Telephone Encounter (Signed)
Contacted pt to go over xray results pt is aware and doesn't have any questions or concerns  

## 2019-05-29 ENCOUNTER — Other Ambulatory Visit: Payer: Medicaid Other

## 2019-06-06 ENCOUNTER — Other Ambulatory Visit: Payer: Medicaid Other | Admitting: *Deleted

## 2019-06-06 ENCOUNTER — Other Ambulatory Visit: Payer: Self-pay

## 2019-06-06 ENCOUNTER — Other Ambulatory Visit: Payer: Self-pay | Admitting: *Deleted

## 2019-06-06 DIAGNOSIS — F639 Impulse disorder, unspecified: Secondary | ICD-10-CM | POA: Diagnosis not present

## 2019-06-06 DIAGNOSIS — F332 Major depressive disorder, recurrent severe without psychotic features: Secondary | ICD-10-CM | POA: Diagnosis not present

## 2019-06-06 DIAGNOSIS — K7460 Unspecified cirrhosis of liver: Secondary | ICD-10-CM

## 2019-06-06 DIAGNOSIS — F410 Panic disorder [episodic paroxysmal anxiety] without agoraphobia: Secondary | ICD-10-CM | POA: Diagnosis not present

## 2019-06-07 ENCOUNTER — Telehealth: Payer: Self-pay

## 2019-06-07 LAB — SPECIMEN STATUS REPORT

## 2019-06-07 NOTE — Telephone Encounter (Signed)
-----   Message from Yetta Flock, MD sent at 06/07/2019  1:06 PM EST ----- Sherlynn Stalls can you touch base with the lab about what happened to this patient's hep B DNA sample. This is the SECOND time in the row this has happened with this test. Please let him know this is a lab issue, if he wishes our lab can draw his blood again to do it if needed, but I don't know why this has occurred again. Thanks

## 2019-06-10 ENCOUNTER — Ambulatory Visit: Payer: Medicaid Other | Admitting: Gastroenterology

## 2019-06-10 ENCOUNTER — Other Ambulatory Visit: Payer: Self-pay

## 2019-06-10 DIAGNOSIS — K7460 Unspecified cirrhosis of liver: Secondary | ICD-10-CM

## 2019-06-10 NOTE — Progress Notes (Signed)
No show letter sent to patient with a reminder to go to the lab.

## 2019-06-12 DIAGNOSIS — N401 Enlarged prostate with lower urinary tract symptoms: Secondary | ICD-10-CM | POA: Diagnosis not present

## 2019-06-12 DIAGNOSIS — R350 Frequency of micturition: Secondary | ICD-10-CM | POA: Diagnosis not present

## 2019-06-12 DIAGNOSIS — N5201 Erectile dysfunction due to arterial insufficiency: Secondary | ICD-10-CM | POA: Diagnosis not present

## 2019-06-12 DIAGNOSIS — R3912 Poor urinary stream: Secondary | ICD-10-CM | POA: Diagnosis not present

## 2019-06-17 ENCOUNTER — Telehealth: Payer: Self-pay | Admitting: Gastroenterology

## 2019-06-17 NOTE — Telephone Encounter (Signed)
Called and spoke to pt. He will go to our lab tomorrow but he spent a lot of time telling me how nervous he is to get his blood drawn and only likes to go to the cardiologist.  He indicated that the phlebotomist only has one chance and if she misses he will leave.  I advised him to be well hydrated. He has an appt with Dr. Havery Moros on Thursday, 2-18.

## 2019-06-17 NOTE — Telephone Encounter (Signed)
Patient has a appointment scheduled for 2/18- states he recieved a letter letting him know to get blood work done. he states that he has blood work done twice for Dr. Havery Moros but he gets it done at his heart doctor. Dr. York Cerise [facility]. Asking if he needs to get blood work done here or if we are able to get those results from Dr. Burt Knack.

## 2019-06-18 LAB — HEPATITIS B DNA, ULTRAQUANTITATIVE, PCR

## 2019-06-19 ENCOUNTER — Encounter: Payer: Self-pay | Admitting: Internal Medicine

## 2019-06-19 ENCOUNTER — Other Ambulatory Visit (INDEPENDENT_AMBULATORY_CARE_PROVIDER_SITE_OTHER): Payer: Medicaid Other

## 2019-06-19 DIAGNOSIS — B181 Chronic viral hepatitis B without delta-agent: Secondary | ICD-10-CM

## 2019-06-19 DIAGNOSIS — K7460 Unspecified cirrhosis of liver: Secondary | ICD-10-CM | POA: Diagnosis not present

## 2019-06-19 DIAGNOSIS — N400 Enlarged prostate without lower urinary tract symptoms: Secondary | ICD-10-CM | POA: Insufficient documentation

## 2019-06-19 LAB — BASIC METABOLIC PANEL
BUN: 28 mg/dL — ABNORMAL HIGH (ref 6–23)
CO2: 30 mEq/L (ref 19–32)
Calcium: 9.5 mg/dL (ref 8.4–10.5)
Chloride: 103 mEq/L (ref 96–112)
Creatinine, Ser: 1.91 mg/dL — ABNORMAL HIGH (ref 0.40–1.50)
GFR: 36.53 mL/min — ABNORMAL LOW (ref >60.00)
Glucose, Bld: 110 mg/dL — ABNORMAL HIGH (ref 70–99)
Potassium: 3.9 mEq/L (ref 3.5–5.1)
Sodium: 139 mEq/L (ref 135–145)

## 2019-06-20 ENCOUNTER — Ambulatory Visit: Payer: Medicaid Other | Admitting: Gastroenterology

## 2019-06-21 LAB — HEPATITIS B DNA, ULTRAQUANTITATIVE, PCR
Hepatitis B DNA (Calc): <1 Log IU/mL — ABNORMAL HIGH
Hepatitis B DNA: <10 IU/mL — ABNORMAL HIGH

## 2019-06-27 ENCOUNTER — Encounter: Payer: Self-pay | Admitting: Gastroenterology

## 2019-06-27 ENCOUNTER — Ambulatory Visit (INDEPENDENT_AMBULATORY_CARE_PROVIDER_SITE_OTHER): Payer: Medicaid Other | Admitting: Gastroenterology

## 2019-06-27 VITALS — BP 140/72 | HR 65 | Temp 98.7°F | Ht 75.0 in | Wt 276.6 lb

## 2019-06-27 DIAGNOSIS — K648 Other hemorrhoids: Secondary | ICD-10-CM

## 2019-06-27 DIAGNOSIS — N189 Chronic kidney disease, unspecified: Secondary | ICD-10-CM | POA: Diagnosis not present

## 2019-06-27 DIAGNOSIS — B181 Chronic viral hepatitis B without delta-agent: Secondary | ICD-10-CM | POA: Diagnosis not present

## 2019-06-27 DIAGNOSIS — K7460 Unspecified cirrhosis of liver: Secondary | ICD-10-CM | POA: Diagnosis not present

## 2019-06-27 MED ORDER — HYDROCORTISONE ACETATE 25 MG RE SUPP: 25.0000 mg | Freq: Every day | RECTAL | 1 refills | Status: AC

## 2019-06-27 NOTE — Progress Notes (Signed)
HPI :  59 y/o male here for a follow up visit for Hep B associated cirrhosis.  He has had compensated cirrhosis historically.  Also history of CAD, CHF with ICD in place, atrial fibrillation on Xarelto, history of stroke, history of rectal bleeding due to hemorrhoids.  Diagnosed with hepatitis B in August 2019, high viral load with positive E antigen.  Liver imaging at the time was consistent with cirrhosis.  He was started on entecavir 0.5 mg every other day due to his CKD.  Over time his viral load has decreased and has since become undetectable.  Most recent labs in recent weeks show normal liver enzymes and undetectable hepatitis B viral load.  Last ultrasound on September 2020 showed stable changes of cirrhosis.  AFP has been normal.  He has never drank alcohol heavily in the past and does not drink any alcohol at this time.  He has had no jaundice, no ascites, he has no known history of varices or encephalopathy.  He is followed by Dr. Burt Knack of cardiology, he takes Coreg 12 and half milligrams twice a day for history of CHF.  In light of his Coreg use we have not needed to perform endoscopy to screen for varices.  Generally has been doing really well in regards to the cirrhosis and remains compensated.  Denies any problems with edema or fluid problems.  He has gained some weight in light of the coronavirus outbreak and how that limited his normal routine of exercise etc.  His main complaint today is bleeding hemorrhoids.  He states he had " surgery" for this with CCS in the past year or 2 which helped that however symptoms have recurred.  He is not sure if he had a banding but it does not sound like he had hemorrhoidectomy.  He has had constipation in the past, managed with fiber supplementation and MiraLAX as needed, he states has been doing both of those things and his stools are soft however he has periodic bleeding from hemorrhoids in the setting of Xarelto that can concern him.  He denies any  perianal pain.  No abdominal pains.  His last colonoscopy was done in 2019 as below  Labs 2/17: Cr 1.91, Cr 36 Hep B DNA undetectable AFP 2.1 Hep B E antigen positive  04/15/19 - ALT 10, AST 15, AP 87, T bil 0.3  [place] 01/09/19 -  IMPRESSION: 1. Cirrhotic changes involving the liver without worrisome hepatic lesions. Normal directional flow in the portal vein. 2. No biliary dilatation. 3. Normal gallbladder.   Colonoscopy 02/06/2018 - 7 small polyps, diverticulosis, hemorrhoids - adenomas - repeat in 3 years  Past Medical History:  Diagnosis Date  . Anxiety   . Arthritis   . Benzodiazepine dependence ([place])   . Bronchial asthma   . CAD (coronary artery disease)    a. Cath 03/2010: mod RCA stenosis, severe diag stenosis, treated medically given lack of angina.  . 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 Chronic systolic congestive heart failure (HCC)    Acute decompensation, LVEF less than 20%  . Cirrhosis ([place])    secondary to hepatitis B  . CKD (chronic kidney disease), stage IV (HCC)    Stage 3-4  . Depression   . Diverticulosis   . Hepatitis B   . History of cocaine abuse ([place])   . Hypertension   . Microcytic anemia   . PAF (paroxysmal atrial fibrillation) ([place])    a. Episode in 2008 with spontaneous  conversion  . S/P implantation of automatic cardioverter/defibrillator (AICD)    a. Medtronic, implanted 2012.  . Sleep apnea   . Stroke [facility]) 2004  . Thyroid disease      Past Surgical History:  Procedure Laterality Date  . CARDIOVERSION N/A 01/07/2014   Procedure: CARDIOVERSION;  Surgeon: Lelon Perla, MD;  Location: [facility] ENDOSCOPY;  Service: Cardiovascular;  Laterality: N/A;  . CARDIOVERSION N/A 02/11/2014   Procedure: CARDIOVERSION;  Surgeon: Pixie Casino, MD;  Location: [place];  Service: Cardiovascular;  Laterality: N/A;  . COLONOSCOPY WITH PROPOFOL N/A 02/06/2018   Procedure: COLONOSCOPY WITH PROPOFOL;  Surgeon: Yetta Flock, MD;   Location: [facility] ENDOSCOPY;  Service: Gastroenterology;  Laterality: N/A;  . LASEK eye surgery Bilateral 05/2018  . PACEMAKER INSERTION    . POLYPECTOMY  02/06/2018   Procedure: POLYPECTOMY;  Surgeon: Yetta Flock, MD;  Location: [facility] ENDOSCOPY;  Service: Gastroenterology;;  . TEE WITHOUT CARDIOVERSION N/A 01/07/2014   Procedure: TRANSESOPHAGEAL ECHOCARDIOGRAM (TEE);  Surgeon: Lelon Perla, MD;  Location: [facility] ENDOSCOPY;  Service: Cardiovascular;  Laterality: N/A;  . TEE WITHOUT CARDIOVERSION N/A 02/11/2014   Procedure: TRANSESOPHAGEAL ECHOCARDIOGRAM (TEE);  Surgeon: Pixie Casino, MD;  Location: [facility] ENDOSCOPY;  Service: Cardiovascular;  Laterality: N/A;  . TONSILLECTOMY    . TRANSTHORACIC ECHOCARDIOGRAM  10/2006, 12/2009   Family History  Problem Relation Age of Onset  . Breast cancer Mother   . Multiple myeloma Mother   . Prostate cancer Father   . Diabetes Father   . Peripheral vascular disease Father   . Cerebral palsy Daughter   . Lung disease Neg Hx   . Colon cancer Neg Hx   . Stomach cancer Neg Hx   . Esophageal cancer Neg Hx   . Colon polyps Neg Hx    Social History   Tobacco Use  . Smoking status: Former Smoker    Packs/day: 0.50    Years: 0.50    Pack years: 0.25    Types: Cigarettes    Quit date: 05/02/2010    Years since quitting: 9.1  . Smokeless tobacco: Never Used  . Tobacco comment: Significant Second-hand and only 3 months himself  Substance Use Topics  . Alcohol use: Not Currently    Alcohol/week: 0.0 standard drinks    Comment: rare  . Drug use: No    Comment: Reported history of cocaine abuse off and on    Current Outpatient Medications  Medication Sig Dispense Refill  . albuterol (VENTOLIN HFA) 108 (90 Base) MCG/ACT inhaler INHALE 2 PUFFS INTO THE LUNGS EVERY 6 HOURS AS NEEDED FOR WHEEZING OR SHORTNESS OF BREATH 8.5 g 2  . alprazolam (XANAX) 2 MG tablet Take 2 mg by mouth every 6 (six) hours as needed for anxiety.     Marland Kitchen amiodarone (PACERONE) 200 MG  tablet TAKE 1 TABLET(200 MG) BY MOUTH DAILY 90 tablet 2  . amLODipine (NORVASC) 10 MG tablet TAKE 1 TABLET BY MOUTH EVERY DAY 30 tablet 6  . aspirin 81 MG EC tablet Take 81 mg by mouth at bedtime.     . carvedilol (COREG) 12.5 MG tablet TAKE 1 TABLET(12.5 MG) BY MOUTH TWICE DAILY WITH A MEAL 180 tablet 3  . DULoxetine (CYMBALTA) 60 MG capsule Take 60 mg by mouth daily.    Marland Kitchen entecavir (BARACLUDE) 0.5 MG tablet Take 1 tablet (0.5 mg total) by mouth every other day. Please note: Every OTHER day 90 tablet 1  . ezetimibe (ZETIA) 10 MG tablet Take 1 tablet (  10 mg total) by mouth daily. 30 tablet 11  . ferrous sulfate 325 (65 FE) MG tablet Take 325 mg by mouth daily with breakfast.    . hydrALAZINE (APRESOLINE) 50 MG tablet Take 1 tablet (50 mg total) by mouth 2 (two) times daily. 180 tablet 3  . mometasone (NASONEX) 50 MCG/ACT nasal spray Place 1 spray into the nose daily as needed. 17 g 2  . ondansetron (ZOFRAN) 4 MG tablet Take 4 mg by mouth 2 (two) times daily as needed for nausea or vomiting.   0  . rivaroxaban (XARELTO) 20 MG TABS tablet Take 1 tablet (20 mg total) by mouth daily with supper. 90 tablet 1  . sildenafil (REVATIO) 20 MG tablet Take 2-5 tablets (40-100 mg total) by mouth daily as needed (erectile dysfunction). 100 tablet 0  . SYMBICORT 160-4.5 MCG/ACT inhaler INHALE 2 PUFFS INTO THE LUNGS TWICE DAILY 10.2 g 1  . tamsulosin (FLOMAX) 0.4 MG CAPS capsule TAKE 1 CAPSULE(0.4 MG) BY MOUTH DAILY 30 capsule 2  . hydrocortisone (ANUSOL-HC) 25 MG suppository Place 1 suppository (25 mg total) rectally daily for 10 days. 12 suppository 1   No current facility-administered medications for this visit.   Allergies  Allergen Reactions  . Codeine Itching, Nausea And Vomiting and Hives  . Hydrocodone-Acetaminophen Itching and Nausea And Vomiting     Review of Systems: All systems reviewed and negative except where noted in HPI.   Lab Results  Component Value Date   WBC 7.0 07/09/2018   HGB  16.4 07/09/2018   HCT 47.9 07/09/2018   MCV 93 07/09/2018   PLT 185 07/09/2018    Lab Results  Component Value Date   CREATININE 1.91 (H) 06/19/2019   BUN 28 (H) 06/19/2019   NA 139 06/19/2019   K 3.9 06/19/2019   CL 103 06/19/2019   CO2 30 06/19/2019    Lab Results  Component Value Date   ALT 10 04/15/2019   AST 15 04/15/2019   ALKPHOS 87 04/15/2019   BILITOT 0.3 04/15/2019    Lab Results  Component Value Date   INR 1.14 12/25/2013   INR 1.0 11/29/2010   INR 1.0 ratio 03/15/2010     Physical Exam: BP 140/72   Pulse 65   Temp 98.7 F (37.1 C)   Ht '6\' 3"'$  (1.905 m)   Wt 276 lb 9.6 oz (125.5 kg)   SpO2 98%   BMI 34.57 kg/m  Constitutional: Pleasant,well-developed, male in no acute distress. HEENT: Normocephalic and atraumatic. Conjunctivae are normal. No scleral icterus. Neck supple.  Cardiovascular: Normal rate, regular rhythm.  Pulmonary/chest: Effort normal and breath sounds normal. No wheezing, rales or rhonchi. Abdominal: Soft, nondistended, nontender. There are no masses palpable.  DRE - standby Tia Alert CMA - no fissure, inflamed internal hemorrhoids noted with stigmata of bleeding Extremities: no edema Lymphadenopathy: No cervical adenopathy noted. Neurological: Alert and oriented to person place and time. Skin: Skin is warm and dry. No rashes noted. Psychiatric: Normal mood and affect. Behavior is normal.   ASSESSMENT AND PLAN: 59 year old male here for reassessment of the following:  Cirrhosis secondary to chronic hepatitis B / chronic kidney disease - chronic hepatitis B E antigen positive with history of cirrhosis that has been compensated to date.  He remains on Entecavir 0.5 mg every other day and his viral load has become undetectable and his ALT has normalized which is excellent.  He is on this current dosing due to his history of CKD.  We discussed  long-term risks of cirrhosis to include decompensating events and HCC, especially with his  history of hep B, he understands this.  Recommend continued [place] surveillance with ultrasound every 6 months, he is due for this in March and will schedule.  His labs are stable right now, we will repeat them again in 6 months.  Given he continues to take Coreg for his history of heart failure, we do not need to screen him for esophageal varices as long as he continues to take this regimen.  He understands.  He will continue to avoid all alcohol and to see me every 6 months.  All questions answered  Internal hemorrhoids - he had some procedural intervention with surgery, unclear if this was banding versus other, however she had some recurrent bleeding symptoms.  I do not see any obvious fissure on exam today and he has clear inflation with internal hemorrhoids and stigmata of bleeding.  Given his Xarelto use and cirrhosis he is higher than average risk for banding related bleeding.  We will give him 10 days worth of Anusol to see if that helps, continue fiber supplementation and keep stools soft.  If his bleeding persists he will follow-up with his surgeon.  He agreed.  I spent 35 minutes of time, including in depth chart review, independent review of results as outlined above, communicating results with the patient directly, face-to-face time with the patient, coordinating care, and ordering studies and medications as appropriate, and documenting this encounter.   Barnwell Cellar, MD [facility] Gastroenterology

## 2019-06-27 NOTE — Patient Instructions (Addendum)
If you are age 57 or older, your body mass index should be between 23-30. Your Body mass index is 34.57 kg/m. If this is out of the aforementioned range listed, please consider follow up with your Primary Care Provider.  If you are age 71 or younger, your body mass index should be between 19-25. Your Body mass index is 34.57 kg/m. If this is out of the aformentioned range listed, please consider follow up with your Primary Care Provider.   We have sent the following medications to your pharmacy for you to pick up at your convenience: Anusol suppositories: use daily for 10 days and then as needed  You have been scheduled for an abdominal ultrasound at [place] (1st floor of hospital) on Tuesday, 07-09-19 at 9:00am. Please arrive 15 minutes prior to your appointment for registration. Make certain not to have anything to eat or drink 6 hours prior to your appointment. Should you need to reschedule your appointment, please contact radiology at [phone]. This test typically takes about 30 minutes to perform.  You will be due for labs in 6 months.  We will call you and remind you when it is time to go to the lab.  Thank you for entrusting me with your care and for choosing [facility], Dr. Lohman Cellar

## 2019-07-08 ENCOUNTER — Other Ambulatory Visit: Payer: Self-pay | Admitting: *Deleted

## 2019-07-09 ENCOUNTER — Other Ambulatory Visit: Payer: Self-pay

## 2019-07-09 ENCOUNTER — Ambulatory Visit (HOSPITAL_COMMUNITY)
Admission: RE | Admit: 2019-07-09 | Discharge: 2019-07-09 | Disposition: A | Discharge status: Home / Self Care | Payer: Medicaid Other | Source: Ambulatory Visit | Attending: Gastroenterology | Admitting: Gastroenterology

## 2019-07-09 DIAGNOSIS — B181 Chronic viral hepatitis B without delta-agent: Secondary | ICD-10-CM | POA: Diagnosis not present

## 2019-07-09 DIAGNOSIS — K7460 Unspecified cirrhosis of liver: Secondary | ICD-10-CM | POA: Diagnosis not present

## 2019-07-09 DIAGNOSIS — K760 Fatty (change of) liver, not elsewhere classified: Secondary | ICD-10-CM | POA: Diagnosis not present

## 2019-07-10 ENCOUNTER — Telehealth: Payer: Self-pay

## 2019-07-10 NOTE — Telephone Encounter (Signed)
Called pt. Relayed results of stable changes on recent RUQ ultrasound.  Placed reminder for repeat U/S in 6 months.

## 2019-07-24 ENCOUNTER — Other Ambulatory Visit: Payer: Self-pay | Admitting: Cardiovascular Disease

## 2019-07-24 DIAGNOSIS — R351 Nocturia: Secondary | ICD-10-CM | POA: Diagnosis not present

## 2019-07-24 DIAGNOSIS — R3912 Poor urinary stream: Secondary | ICD-10-CM | POA: Diagnosis not present

## 2019-07-24 DIAGNOSIS — R350 Frequency of micturition: Secondary | ICD-10-CM | POA: Diagnosis not present

## 2019-07-24 DIAGNOSIS — N401 Enlarged prostate with lower urinary tract symptoms: Secondary | ICD-10-CM | POA: Diagnosis not present

## 2019-07-25 NOTE — Telephone Encounter (Signed)
Pt's pharmacy is requesting a refill on sildenafil. Would Dr. Cooper like to refill this medication? Please address 

## 2019-07-29 ENCOUNTER — Other Ambulatory Visit: Payer: Self-pay | Admitting: Cardiovascular Disease

## 2019-07-30 NOTE — Telephone Encounter (Signed)
Last OV 04/15/2019 Scr 1.91 on 06/19/19 ABW crcl 76 Xarelto 20mg  sent to pharmacy

## 2019-08-03 DIAGNOSIS — H43393 Other vitreous opacities, bilateral: Secondary | ICD-10-CM | POA: Diagnosis not present

## 2019-08-08 NOTE — Progress Notes (Signed)
[facility] Note  08/09/2019     CHIEF COMPLAINT Patient presents for Retina Evaluation   HISTORY OF PRESENT ILLNESS: Cody Ortega is a 59 y.o. male who presents to the clinic today for:   HPI    Retina Evaluation    In left eye.  This started 3 days ago.  Duration of 3 days.  Associated Symptoms Flashes and Floaters.  Context:  distance vision, mid-range vision and near vision.  Treatments tried include eye drops and artificial tears.  Response to treatment was mild improvement.  I, the attending physician,  performed the HPI with the patient and updated documentation appropriately.          Comments    59 y/o male pt here referred by Dr. [name] for ret eval for decreased VA OS and floaters x 3 days.  Symptoms were sudden onset, but pt does report excessive heavy lifting and difficult bowel movements in the day or so prior to symptoms appearing.  Pt initially saw several temporal flashes OS, and a large amount of floaters, but now it appears as though he only has one "watery" floater OS, and pt has not noticed flashes in a couple of days.  No changes noticed OD, but pt states VA OD has been weaker than VA OS for many yrs.  Pt reports he had LASIK OU about 5 yrs ago, and also began to notice a slight decrease in [place] OU about 5 yrs ago following steroid injections in his knee.  Pt also reports that it was around that time that he was told he had had a "stroke in his right eye."  Denies pain, headaches.  Restasis bid OU.  Allergy gtts prn OU.       Last edited by Bernarda Caffey, MD on 08/09/2019  8:40 AM. (History)    pt states he saw Dr. [name] a couple days ago, she is his regular eye dr and has annual exams with her, he states he was driving to [place] a few days ago and kept seeing flashes of light in his peripheral vision, he states after that he started seeing "invisible dust" in his vision, pt states he was told he had a "stroke" in his right eye a year or 2  ago, pt states he has CHF, he states he had been hospitalized for an unrelated illness and the day he was released he had "bright flashes" in his right eye and since then he has noticed peripheral vision loss  Referring physician: [place] [address] [place], [place],  [place] [id number]  HISTORICAL INFORMATION:   Selected notes from the MEDICAL RECORD NUMBER Referred by Dr. [name] for decreased vision and floaters x3 days LEE:  Ocular Hx- PMH   CURRENT MEDICATIONS: No current outpatient medications on file. (Ophthalmic Drugs)   No current facility-administered medications for this visit. (Ophthalmic Drugs)   Current Outpatient Medications (Other)  Medication Sig  . albuterol (VENTOLIN HFA) 108 (90 Base) MCG/ACT inhaler INHALE 2 PUFFS INTO THE LUNGS EVERY 6 HOURS AS NEEDED FOR WHEEZING OR SHORTNESS OF BREATH  . alprazolam (XANAX) 2 MG tablet Take 2 mg by mouth every 6 (six) hours as needed for anxiety.   Marland Kitchen amiodarone (PACERONE) 200 MG tablet TAKE 1 TABLET(200 MG) BY MOUTH DAILY  . amLODipine (NORVASC) 10 MG tablet TAKE 1 TABLET BY MOUTH EVERY DAY  . aspirin 81 MG EC tablet Take 81 mg by mouth at  bedtime.   . carvedilol (COREG) 12.5 MG tablet TAKE 1 TABLET(12.5 MG) BY MOUTH TWICE DAILY WITH A MEAL  . DULoxetine (CYMBALTA) 60 MG capsule Take 60 mg by mouth daily.  Marland Kitchen entecavir (BARACLUDE) 0.5 MG tablet Take 1 tablet (0.5 mg total) by mouth every other day. Please note: Every OTHER day  . ezetimibe (ZETIA) 10 MG tablet Take 1 tablet (10 mg total) by mouth daily.  . ferrous sulfate 325 (65 FE) MG tablet Take 325 mg by mouth daily with breakfast.  . hydrALAZINE (APRESOLINE) 50 MG tablet Take 1 tablet (50 mg total) by mouth 2 (two) times daily.  . mometasone (NASONEX) 50 MCG/ACT nasal spray Place 1 spray into the nose daily as needed.  . ondansetron (ZOFRAN) 4 MG tablet Take 4 mg by mouth 2 (two) times daily as needed for nausea or vomiting.   . sildenafil (REVATIO) 20 MG tablet TAKE TWO TO  FIVE TABLETS BY MOUTH DAILY AS NEEDED FOR ERECTILE DYSFUNCTION  . SYMBICORT 160-4.5 MCG/ACT inhaler INHALE 2 PUFFS INTO THE LUNGS TWICE DAILY  . tamsulosin (FLOMAX) 0.4 MG CAPS capsule TAKE 1 CAPSULE(0.4 MG) BY MOUTH DAILY  . XARELTO 20 MG TABS tablet TAKE 1 TABLET(20 MG) BY MOUTH DAILY WITH SUPPER   No current facility-administered medications for this visit. (Other)      REVIEW OF SYSTEMS: ROS    Positive for: Gastrointestinal, Neurological, Musculoskeletal, Cardiovascular, Eyes, Respiratory   Negative for: Constitutional, Skin, Genitourinary, HENT, Endocrine, Psychiatric, Allergic/Imm, Heme/Lymph   Last edited by Matthew Folks, COA on 08/09/2019  8:03 AM. (History)       ALLERGIES Allergies  Allergen Reactions  . Codeine Itching, Nausea And Vomiting and Hives  . Hydrocodone-Acetaminophen Itching and Nausea And Vomiting    PAST MEDICAL HISTORY Past Medical History:  Diagnosis Date  . Anxiety   . Arthritis   . Benzodiazepine dependence ([place])   . Bronchial asthma   . CAD (coronary artery disease)    a. Cath 03/2010: mod RCA stenosis, severe diag stenosis, treated medically given lack of angina.  . 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 Chronic systolic congestive heart failure (HCC)    Acute decompensation, LVEF less than 20%  . 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 CKD (chronic kidney disease), stage IV (HCC)    Stage 3-4  . Depression   . Diverticulosis   . Hepatitis B   . History of cocaine abuse ([place])   . Hypertension   . Microcytic anemia   . PAF (paroxysmal atrial fibrillation) ([place])    a. Episode in 2008 with spontaneous conversion  . S/P implantation of automatic cardioverter/defibrillator (AICD)    a. Medtronic, implanted 2012.  . Sleep apnea   . Stroke [facility]) 2004  . Thyroid disease    Past Surgical History:  Procedure Laterality Date  . CARDIOVERSION N/A 01/07/2014   Procedure: CARDIOVERSION;  Surgeon: Lelon Perla, MD;   Location: [facility] ENDOSCOPY;  Service: Cardiovascular;  Laterality: N/A;  . CARDIOVERSION N/A 02/11/2014   Procedure: CARDIOVERSION;  Surgeon: Pixie Casino, MD;  Location: [place];  Service: Cardiovascular;  Laterality: N/A;  . COLONOSCOPY WITH PROPOFOL N/A 02/06/2018   Procedure: COLONOSCOPY WITH PROPOFOL;  Surgeon: Yetta Flock, MD;  Location: [facility] ENDOSCOPY;  Service: Gastroenterology;  Laterality: N/A;  . LASEK eye surgery Bilateral 05/2018  . LASIK Bilateral   . PACEMAKER INSERTION    . POLYPECTOMY  02/06/2018   Procedure: POLYPECTOMY;  Surgeon:  Armbruster, Carlota Raspberry, MD;  Location: [facility] ENDOSCOPY;  Service: Gastroenterology;;  . TEE WITHOUT CARDIOVERSION N/A 01/07/2014   Procedure: TRANSESOPHAGEAL ECHOCARDIOGRAM (TEE);  Surgeon: Lelon Perla, MD;  Location: [facility] ENDOSCOPY;  Service: Cardiovascular;  Laterality: N/A;  . TEE WITHOUT CARDIOVERSION N/A 02/11/2014   Procedure: TRANSESOPHAGEAL ECHOCARDIOGRAM (TEE);  Surgeon: Pixie Casino, MD;  Location: [facility] ENDOSCOPY;  Service: Cardiovascular;  Laterality: N/A;  . TONSILLECTOMY    . TRANSTHORACIC ECHOCARDIOGRAM  10/2006, 12/2009    FAMILY HISTORY Family History  Problem Relation Age of Onset  . Breast cancer Mother   . Multiple myeloma Mother   . Prostate cancer Father   . Diabetes Father   . Peripheral vascular disease Father   . Cerebral palsy Daughter   . Lung disease Neg Hx   . Colon cancer Neg Hx   . Stomach cancer Neg Hx   . Esophageal cancer Neg Hx   . Colon polyps Neg Hx     SOCIAL HISTORY Social History   Tobacco Use  . Smoking status: Former Smoker    Packs/day: 0.50    Years: 0.50    Pack years: 0.25    Types: Cigarettes    Quit date: 05/02/2010    Years since quitting: 9.2  . Smokeless tobacco: Never Used  . Tobacco comment: Significant Second-hand and only 3 months himself  Substance Use Topics  . Alcohol use: Not Currently    Alcohol/week: 0.0 standard drinks    Comment: rare  . Drug use: No     Comment: Reported history of cocaine abuse off and on          OPHTHALMIC EXAM:  Base Eye Exam    Visual Acuity (Snellen - Linear)      Right Left   Dist [place] 20/30 -2 20/20 -2   Dist ph [place] 20/25 -        Tonometry (Tonopen, 8:08 AM)      Right Left   Pressure 14 11       Pupils      Dark Light Shape React APD   Right 4 3 Round Brisk None   Left 4 3 Round Brisk None       Visual Fields (Counting fingers)      Left Right    Full Full       Extraocular Movement      Right Left    Full, Ortho Full, Ortho       Neuro/Psych    Oriented x3: Yes   Mood/Affect: Normal       Dilation    Both eyes: 1.0% Mydriacyl, 2.5% Phenylephrine @ 8:08 AM        Slit Lamp and Fundus Exam    Slit Lamp Exam      Right Left   Lids/Lashes Dermatochalasis - upper lid, mild Meibomian gland dysfunction Dermatochalasis - upper lid, mild Meibomian gland dysfunction   Conjunctiva/Sclera White and quiet White and quiet   Cornea Trace Punctate epithelial erosions, Debris in tear film, mild EBMD, well healed temporal cataract wounds Debris in tear film, well healed temporal cataract wounds, mild Arcus   Anterior Chamber Deep and quiet Deep and quiet   Iris Round and dilated Round and dilated   Lens PC IOL in good position PC IOL in good position   Vitreous Vitreous syneresis Vitreous syneresis, blood stained vitreous condensations, Posterior vitreous detachment       Fundus Exam      Right Left   Disc  Mild Pallor, vascular loops Mild Pallor, Pink and Sharp   C/D Ratio 0.3 0.4   Macula Flat, Blunted foveal reflex, mild Retinal pigment epithelial mottling Flat, good foveal reflex, focal RPE disruption IN fovea, no heme or edema   Vessels Severe Vascular attenuation, superior hemisphere with telangectasias and perivascular exudation / sheathing Vascular attenuation, mild Tortuousity   Periphery Attached    Attached, inferior lattice, peripheral cystoid degeneration, No RT/RD on 360 scleral  depression         Refraction    Manifest Refraction      Sphere Cylinder Dist VA   Right +0.50 Sphere 20/25-2   Left -0.25 Sphere 20/20-          IMAGING AND PROCEDURES  Imaging and Procedures for '@TODAY'$ @  OCT, Retina - OU - Both Eyes       Right Eye Quality was good. Central Foveal Thickness: 230. Progression has no prior data. 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   Left Eye Quality was poor. Central Foveal Thickness: 235. Progression has no prior data. Findings include normal foveal contour, no SRF, no IRF (Focal ellipsoid disruption inferonasal fovea, mild vitreous opacities).   Notes *Images captured and stored on drive  Diagnosis / Impression:  OD: Inner retinal atrophy, diffuse inner macular atrophy inferior greater than superior--remote BRAO OS: Focal ellipsoid disruption inferonasal fovea, mild vitreous opacities  Clinical management:  See below  Abbreviations: NFP - Normal foveal profile. CME - cystoid macular edema. PED - pigment epithelial detachment. IRF - intraretinal fluid. SRF - subretinal fluid. EZ - ellipsoid zone. ERM - epiretinal membrane. ORA - outer retinal atrophy. ORT - outer retinal tubulation. SRHM - subretinal hyper-reflective material        Fluorescein Angiography Optos (Transit OD)       Right Eye   Progression has no prior data. Early phase findings include delayed filling, vascular perfusion defect. Mid/Late phase findings include vascular perfusion defect (telangectasias superior macula).   Left Eye   Progression has no prior data. Early phase findings include blockage, staining. Mid/Late phase findings include blockage, staining (Hyperfluorescence of the disc, perifoveal staining inferior macula).   Notes **Images stored on drive**  Impression: OD: remote, inferior BRAO OS: Hyperfluorescence of the disc,  perifoveal staining inferior macula                  ASSESSMENT/PLAN:    ICD-10-CM   1. Posterior vitreous detachment of left eye  H43.812   2. Vitreous hemorrhage of left eye (HCC)  H43.12   3. Retinal artery branch occlusion of right eye  H34.231   4. Retinal edema  H35.81 OCT, Retina - OU - Both Eyes  5. Essential hypertension  I10   6. Hypertensive retinopathy of both eyes  H35.033   7. Pseudophakia of both eyes  Z96.1     1,2. Hemorrhagic PVD OS  - Onset ~2 wks -- presented to Dr. Kelby Fam "a couple days ago" per pt history  - Discussed findings and prognosis  - No RT or RD on 360 scleral depressed exam  - Reviewed s/s of RT/RD  - Strict return precautions for any such RT/RD signs/symptoms  - VH precautions reviewed -- minimize activities, keep head elevated, avoid ASA/NSAIDs/blood thinners as able  - F/U 3-4 weeks  3,4. 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 Hypertensive retinopathy OU  - discussed importance of tight BP control  - monitor  7. Pseudophakia OU  - s/p CE/IOL OU  - beautiful surgery, doing well  - monitor   Ophthalmic Meds Ordered this visit:  No orders of the defined types were placed in this encounter.      Return for f/u 3-4 weeks hemorrhagic PVD OS, DFE, OCT.  There are no Patient Instructions on file for this visit.   Explained the diagnoses, plan, and follow up with the patient and they expressed understanding.  Patient expressed understanding of the importance of proper follow up care.   This document serves as a record of services personally performed by Gardiner Sleeper, MD, PhD. It was created on their behalf by Ernest Mallick, OA, an ophthalmic assistant. The creation of this record is the provider's dictation and/or activities during the visit.    Electronically signed by: Ernest Mallick, OA 04.08.2021 12:24 AM  Gardiner Sleeper, M.D., Ph.D. Diseases &  Surgery of the Retina and Vitreous [facility]  I have reviewed the above documentation for accuracy and completeness, and I agree with the above. Gardiner Sleeper, M.D., Ph.D. 08/11/19 12:24 AM   Abbreviations: M myopia (nearsighted); A astigmatism; H hyperopia (farsighted); P presbyopia; Mrx spectacle prescription;  CTL contact lenses; OD right eye; OS left eye; OU both eyes  XT exotropia; ET esotropia; PEK punctate epithelial keratitis; PEE punctate epithelial erosions; DES dry eye syndrome; MGD meibomian gland dysfunction; ATs artificial tears; PFAT's preservative free artificial tears; [place] nuclear sclerotic cataract; PSC posterior subcapsular cataract; ERM epi-retinal membrane; PVD posterior vitreous detachment; RD retinal detachment; DM diabetes mellitus; DR diabetic retinopathy; NPDR non-proliferative diabetic retinopathy; PDR proliferative diabetic retinopathy; CSME clinically significant macular edema; DME diabetic macular edema; dbh dot blot hemorrhages; CWS cotton wool spot; POAG primary open angle glaucoma; C/D cup-to-disc ratio; HVF humphrey visual field; GVF goldmann visual field; OCT optical coherence tomography; IOP intraocular pressure; BRVO Branch retinal vein occlusion; CRVO central retinal vein occlusion; CRAO central retinal artery occlusion; BRAO branch retinal artery occlusion; RT retinal tear; SB scleral buckle; PPV pars plana vitrectomy; VH Vitreous hemorrhage; PRP panretinal laser photocoagulation; IVK intravitreal kenalog; VMT vitreomacular traction; MH Macular hole;  NVD neovascularization of the disc; NVE neovascularization elsewhere; AREDS age related eye disease study; ARMD age related macular degeneration; POAG primary open angle glaucoma; EBMD epithelial/anterior basement membrane dystrophy; ACIOL anterior chamber intraocular lens; IOL intraocular lens; PCIOL posterior chamber intraocular lens; Phaco/IOL phacoemulsification with intraocular lens  placement; [place] photorefractive keratectomy; LASIK laser assisted in situ keratomileusis; HTN hypertension; DM diabetes mellitus; COPD chronic obstructive pulmonary disease

## 2019-08-09 ENCOUNTER — Ambulatory Visit (INDEPENDENT_AMBULATORY_CARE_PROVIDER_SITE_OTHER): Payer: Medicaid Other | Admitting: Ophthalmology

## 2019-08-09 ENCOUNTER — Encounter (INDEPENDENT_AMBULATORY_CARE_PROVIDER_SITE_OTHER): Payer: Self-pay | Admitting: Ophthalmology

## 2019-08-09 DIAGNOSIS — H43812 Vitreous degeneration, left eye: Secondary | ICD-10-CM | POA: Diagnosis not present

## 2019-08-09 DIAGNOSIS — H34231 Retinal artery branch occlusion, right eye: Secondary | ICD-10-CM

## 2019-08-09 DIAGNOSIS — I10 Essential (primary) hypertension: Secondary | ICD-10-CM | POA: Diagnosis not present

## 2019-08-09 DIAGNOSIS — H4312 Vitreous hemorrhage, left eye: Secondary | ICD-10-CM | POA: Diagnosis not present

## 2019-08-09 DIAGNOSIS — H3581 Retinal edema: Secondary | ICD-10-CM

## 2019-08-09 DIAGNOSIS — Z961 Presence of intraocular lens: Secondary | ICD-10-CM | POA: Diagnosis not present

## 2019-08-09 DIAGNOSIS — H35033 Hypertensive retinopathy, bilateral: Secondary | ICD-10-CM

## 2019-08-11 ENCOUNTER — Encounter (INDEPENDENT_AMBULATORY_CARE_PROVIDER_SITE_OTHER): Payer: Self-pay | Admitting: Ophthalmology

## 2019-08-30 DIAGNOSIS — F410 Panic disorder [episodic paroxysmal anxiety] without agoraphobia: Secondary | ICD-10-CM | POA: Diagnosis not present

## 2019-08-30 DIAGNOSIS — F330 Major depressive disorder, recurrent, mild: Secondary | ICD-10-CM | POA: Diagnosis not present

## 2019-08-30 NOTE — Progress Notes (Signed)
[facility] Note  09/06/2019     CHIEF COMPLAINT Patient presents for Retina Follow Up   HISTORY OF PRESENT ILLNESS: Cody Ortega is a 59 y.o. male who presents to the clinic today for:   HPI    Retina Follow Up    Patient presents with  CRVO/BRVO.  In right eye.  Duration of 4 weeks.  Since onset it is stable.  I, the attending physician,  performed the HPI with the patient and updated documentation appropriately.          Comments    A shadow moving around OS no change since last visit.  Blurred vision but stable OU. He restarted Restasis OU BID.       Last edited by Bernarda Caffey, MD on 09/09/2019  4:46 PM. (History)    pt states he still sees a "whispy hair" in his vision, he says outside images still look hazy, but it's still very bright, he is sleeping with his head elavated   Referring physician: Ladell Pier, MD [place],  [place] [id number]  HISTORICAL INFORMATION:   Selected notes from the MEDICAL RECORD NUMBER Referred by Dr. [name] for decreased vision and floaters x3 days LEE:  Ocular Hx- PMH   CURRENT MEDICATIONS: Current Outpatient Medications (Ophthalmic Drugs)  Medication Sig  . cycloSPORINE (RESTASIS) 0.05 % ophthalmic emulsion Place 1 drop into both eyes 2 (two) times daily.   No current facility-administered medications for this visit. (Ophthalmic Drugs)   Current Outpatient Medications (Other)  Medication Sig  . albuterol (VENTOLIN HFA) 108 (90 Base) MCG/ACT inhaler INHALE 2 PUFFS INTO THE LUNGS EVERY 6 HOURS AS NEEDED FOR WHEEZING OR SHORTNESS OF BREATH  . alprazolam (XANAX) 2 MG tablet Take 2 mg by mouth every 6 (six) hours as needed for anxiety.   Marland Kitchen amiodarone (PACERONE) 200 MG tablet TAKE 1 TABLET(200 MG) BY MOUTH DAILY  . amLODipine (NORVASC) 10 MG tablet TAKE 1 TABLET BY MOUTH EVERY DAY  . aspirin 81 MG EC tablet Take 81 mg by mouth at bedtime.   . budesonide-formoterol (SYMBICORT) 160-4.5  MCG/ACT inhaler Inhale 2 puffs into the lungs 2 (two) times daily.  . carvedilol (COREG) 12.5 MG tablet TAKE 1 TABLET(12.5 MG) BY MOUTH TWICE DAILY WITH A MEAL  . DULoxetine (CYMBALTA) 60 MG capsule Take 60 mg by mouth daily.  Marland Kitchen entecavir (BARACLUDE) 0.5 MG tablet Take 1 tablet (0.5 mg total) by mouth every other day. Please note: Every OTHER day  . ezetimibe (ZETIA) 10 MG tablet Take 1 tablet (10 mg total) by mouth daily.  . ferrous sulfate 325 (65 FE) MG tablet Take 325 mg by mouth daily with breakfast.  . hydrALAZINE (APRESOLINE) 50 MG tablet Take 1 tablet (50 mg total) by mouth 2 (two) times daily.  . mometasone (NASONEX) 50 MCG/ACT nasal spray Place 1 spray into the nose daily as needed.  . ondansetron (ZOFRAN) 4 MG tablet Take 4 mg by mouth 2 (two) times daily as needed for nausea or vomiting.   . sildenafil (REVATIO) 20 MG tablet TAKE TWO TO FIVE TABLETS BY MOUTH DAILY AS NEEDED FOR ERECTILE DYSFUNCTION  . tamsulosin (FLOMAX) 0.4 MG CAPS capsule TAKE 1 CAPSULE(0.4 MG) BY MOUTH DAILY  . XARELTO 20 MG TABS tablet TAKE 1 TABLET(20 MG) BY MOUTH DAILY WITH SUPPER   No current facility-administered medications for this visit. (Other)      REVIEW OF SYSTEMS: ROS  Positive for: Gastrointestinal, Neurological, Musculoskeletal, Cardiovascular, Eyes, Respiratory   Negative for: Constitutional, Skin, Genitourinary, HENT, Endocrine, Psychiatric, Allergic/Imm, Heme/Lymph   Last edited by Leonie Douglas, COA on 09/06/2019  9:40 AM. (History)       ALLERGIES Allergies  Allergen Reactions  . Codeine Itching, Nausea And Vomiting and Hives  . Hydrocodone-Acetaminophen Itching and Nausea And Vomiting    PAST MEDICAL HISTORY Past Medical History:  Diagnosis Date  . Anxiety   . Arthritis   . Benzodiazepine dependence ([place])   . Bronchial asthma   . CAD (coronary artery disease)    a. Cath 03/2010: mod RCA stenosis, severe diag stenosis, treated medically given lack of angina.  .  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 Chronic systolic congestive heart failure (HCC)    Acute decompensation, LVEF less than 20%  . 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 CKD (chronic kidney disease), stage IV (HCC)    Stage 3-4  . Depression   . Diverticulosis   . Hepatitis B   . History of cocaine abuse ([place])   . Hypertension   . Microcytic anemia   . PAF (paroxysmal atrial fibrillation) ([place])    a. Episode in 2008 with spontaneous conversion  . S/P implantation of automatic cardioverter/defibrillator (AICD)    a. Medtronic, implanted 2012.  . Sleep apnea   . Stroke [facility]) 2004  . Thyroid disease    Past Surgical History:  Procedure Laterality Date  . CARDIOVERSION N/A 01/07/2014   Procedure: CARDIOVERSION;  Surgeon: Lelon Perla, MD;  Location[facility] ENDOSCOPY;  Service: Cardiovascular;  Laterality: N/A;  . CARDIOVERSION N/A 02/11/2014   Procedure: CARDIOVERSION;  Surgeon: Pixie Casino, MD;  Location: [place];  Service: Cardiovascular;  Laterality: N/A;  . COLONOSCOPY WITH PROPOFOL N/A 02/06/2018   Procedure: COLONOSCOPY WITH PROPOFOL;  Surgeon: Yetta Flock, MD;  Location: [facility] ENDOSCOPY;  Service: Gastroenterology;  Laterality: N/A;  . LASEK eye surgery Bilateral 05/2018  . LASIK Bilateral   . PACEMAKER INSERTION    . POLYPECTOMY  02/06/2018   Procedure: POLYPECTOMY;  Surgeon: Yetta Flock, MD;  Location: [facility] ENDOSCOPY;  Service: Gastroenterology;;  . TEE WITHOUT CARDIOVERSION N/A 01/07/2014   Procedure: TRANSESOPHAGEAL ECHOCARDIOGRAM (TEE);  Surgeon: Lelon Perla, MD;  Location: [facility] ENDOSCOPY;  Service: Cardiovascular;  Laterality: N/A;  . TEE WITHOUT CARDIOVERSION N/A 02/11/2014   Procedure: TRANSESOPHAGEAL ECHOCARDIOGRAM (TEE);  Surgeon: Pixie Casino, MD;  Location: [facility] ENDOSCOPY;  Service: Cardiovascular;  Laterality: N/A;  . TONSILLECTOMY    . TRANSTHORACIC ECHOCARDIOGRAM  10/2006, 12/2009    FAMILY HISTORY Family History   Problem Relation Age of Onset  . Breast cancer Mother   . Multiple myeloma Mother   . Prostate cancer Father   . Diabetes Father   . Peripheral vascular disease Father   . Cerebral palsy Daughter   . Lung disease Neg Hx   . Colon cancer Neg Hx   . Stomach cancer Neg Hx   . Esophageal cancer Neg Hx   . Colon polyps Neg Hx     SOCIAL HISTORY Social History   Tobacco Use  . Smoking status: Former Smoker    Packs/day: 0.50    Years: 0.50    Pack years: 0.25    Types: Cigarettes    Quit date: 05/02/2010    Years since quitting: 9.3  . Smokeless tobacco: Never Used  . Tobacco comment: Significant Second-hand and only 3 months himself  Substance Use  Topics  . Alcohol use: Not Currently    Alcohol/week: 0.0 standard drinks    Comment: rare  . Drug use: No    Comment: Reported history of cocaine abuse off and on          OPHTHALMIC EXAM:  Base Eye Exam    Visual Acuity (Snellen - Linear)      Right Left   Dist [place] 20/30 +2 20/20 -1       Tonometry (Tonopen, 9:46 AM)      Right Left   Pressure 13 14       Pupils      Dark Light Shape React APD   Right 3 2 Round Brisk None   Left 3 2 Round Brisk None       Visual Fields (Counting fingers)      Left Right     Full       Extraocular Movement      Right Left    Full Full       Neuro/Psych    Oriented x3: Yes   Mood/Affect: Normal       Dilation    Both eyes: 1.0% Mydriacyl, 2.5% Phenylephrine @ 9:47 AM        Slit Lamp and Fundus Exam    Slit Lamp Exam      Right Left   Lids/Lashes Dermatochalasis - upper lid, mild Meibomian gland dysfunction Dermatochalasis - upper lid, mild Meibomian gland dysfunction   Conjunctiva/Sclera White and quiet White and quiet   Cornea Trace Punctate epithelial erosions, Debris in tear film, mild EBMD, well healed temporal cataract wounds Debris in tear film, well healed temporal cataract wounds, mild Arcus   Anterior Chamber Deep and quiet Deep and quiet   Iris Round  and dilated Round and dilated   Lens PC IOL in good position PC IOL in good position   Vitreous Vitreous syneresis Vitreous syneresis, blood stained vitreous condensations clearing and settling inferiorly, Posterior vitreous detachment       Fundus Exam      Right Left   Disc Mild Pallor, vascular loops Mild Pallor, Pink and Sharp   C/D Ratio 0.3 0.4   Macula Flat, Blunted foveal reflex, mild Retinal pigment epithelial mottling Flat, good foveal reflex, focal RPE disruption IN fovea, no heme or edema   Vessels Severe Vascular attenuation, superior hemisphere with telangectasias and perivascular exudation / sheathing Vascular attenuation, mild Tortuousity   Periphery Attached    Attached, inferior lattice, peripheral cystoid degeneration, No RT/RD on 360 scleral depression           IMAGING AND PROCEDURES  Imaging and Procedures for _0 @  OCT, Retina - OU - Both Eyes       Right Eye Quality was good. Central Foveal Thickness: 230. Progression has been stable. 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   Left Eye Quality was poor. Central Foveal Thickness: 234. Progression has been stable. Findings include normal foveal contour, no SRF, no IRF (Focal ellipsoid disruption inferonasal fovea, interval improvement in vitreous opacities).   Notes *Images captured and stored on drive  Diagnosis / Impression:  OD: Inner retinal atrophy, diffuse inner macular atrophy inferior greater than superior--remote BRAO OS: Focal ellipsoid disruption inferonasal fovea, interval improvement in vitreous opacities  Clinical management:  See below  Abbreviations: NFP - Normal foveal profile. CME - cystoid macular edema. PED - pigment epithelial detachment. IRF -  intraretinal fluid. SRF - subretinal fluid. EZ - ellipsoid zone. ERM - epiretinal membrane. ORA - outer retinal atrophy. ORT  - outer retinal tubulation. SRHM - subretinal hyper-reflective material                 ASSESSMENT/PLAN:    ICD-10-CM   1. Posterior vitreous detachment of left eye  H43.812   2. Vitreous hemorrhage of left eye (HCC)  H43.12   3. Retinal artery branch occlusion of right eye  H34.231   4. Retinal edema  H35.81 OCT, Retina - OU - Both Eyes  5. Essential hypertension  I10   6. Hypertensive retinopathy of both eyes  H35.033   7. Pseudophakia of both eyes  Z96.1     1,2. Hemorrhagic PVD OS  - Onset late March 2021 -- presented to Dr. Jerilynn Mages. Le per pt history  - blood stained vit clearing and settling inferiorly  - Discussed findings and prognosis  - No RT or RD on 360 scleral depressed exam  - Reviewed s/s of RT/RD  - Strict return precautions for any such RT/RD signs/symptoms  - VH precautions reviewed -- minimize activities, keep head elevated, avoid ASA/NSAIDs/blood thinners as able  - F/U 4 weeks  3,4. 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 Hypertensive retinopathy OU  - discussed importance of tight BP control  - monitor  7. Pseudophakia OU  - s/p CE/IOL OU  - beautiful surgery, doing well  - monitor   Ophthalmic Meds Ordered this visit:  No orders of the defined types were placed in this encounter.      Return in about 4 weeks (around 10/04/2019) for f/u hemorrhagic PVD OS, DFE, OCT.  There are no Patient Instructions on file for this visit.   Explained the diagnoses, plan, and follow up with the patient and they expressed understanding.  Patient expressed understanding of the importance of proper follow up care.   This document serves as a record of services personally performed by Gardiner Sleeper, MD, PhD. It was created on their behalf by Ernest Mallick, OA, an ophthalmic assistant. The creation of this record is the provider's dictation and/or activities during the visit.     Electronically signed by: Ernest Mallick, OA 04.30.2021 4:47 PM  Gardiner Sleeper, M.D., Ph.D. Diseases & Surgery of the Retina and Vitreous [facility]  I have reviewed the above documentation for accuracy and completeness, and I agree with the above. Gardiner Sleeper, M.D., Ph.D. 09/09/19 4:48 PM   Abbreviations: M myopia (nearsighted); A astigmatism; H hyperopia (farsighted); P presbyopia; Mrx spectacle prescription;  CTL contact lenses; OD right eye; OS left eye; OU both eyes  XT exotropia; ET esotropia; PEK punctate epithelial keratitis; PEE punctate epithelial erosions; DES dry eye syndrome; MGD meibomian gland dysfunction; ATs artificial tears; PFAT's preservative free artificial tears; [place] nuclear sclerotic cataract; PSC posterior subcapsular cataract; ERM epi-retinal membrane; PVD posterior vitreous detachment; RD retinal detachment; DM diabetes mellitus; DR diabetic retinopathy; NPDR non-proliferative diabetic retinopathy; PDR proliferative diabetic retinopathy; CSME clinically significant macular edema; DME diabetic macular edema; dbh dot blot hemorrhages; CWS cotton wool spot; POAG primary open angle glaucoma; C/D cup-to-disc ratio; HVF humphrey visual field; GVF goldmann visual field; OCT optical coherence tomography; IOP intraocular pressure; BRVO Branch retinal vein occlusion; CRVO central retinal vein occlusion; CRAO central retinal artery occlusion; BRAO branch retinal artery occlusion;  RT retinal tear; SB scleral buckle; PPV pars plana vitrectomy; VH Vitreous hemorrhage; PRP panretinal laser photocoagulation; IVK intravitreal kenalog; VMT vitreomacular traction; MH Macular hole;  NVD neovascularization of the disc; NVE neovascularization elsewhere; AREDS age related eye disease study; ARMD age related macular degeneration; POAG primary open angle glaucoma; EBMD epithelial/anterior basement membrane dystrophy; ACIOL anterior chamber intraocular lens; IOL intraocular  lens; PCIOL posterior chamber intraocular lens; Phaco/IOL phacoemulsification with intraocular lens placement; [place] photorefractive keratectomy; LASIK laser assisted in situ keratomileusis; HTN hypertension; DM diabetes mellitus; COPD chronic obstructive pulmonary disease

## 2019-09-05 ENCOUNTER — Encounter: Payer: Self-pay | Admitting: Internal Medicine

## 2019-09-05 ENCOUNTER — Ambulatory Visit: Payer: Medicaid Other | Attending: Internal Medicine | Admitting: Internal Medicine

## 2019-09-05 ENCOUNTER — Other Ambulatory Visit: Payer: Self-pay

## 2019-09-05 VITALS — BP 134/82 | HR 61 | Temp 98.1°F | Resp 16 | Wt 282.6 lb

## 2019-09-05 DIAGNOSIS — Z7982 Long term (current) use of aspirin: Secondary | ICD-10-CM | POA: Insufficient documentation

## 2019-09-05 DIAGNOSIS — I2510 Atherosclerotic heart disease of native coronary artery without angina pectoris: Secondary | ICD-10-CM | POA: Insufficient documentation

## 2019-09-05 DIAGNOSIS — N529 Male erectile dysfunction, unspecified: Secondary | ICD-10-CM | POA: Diagnosis not present

## 2019-09-05 DIAGNOSIS — B1910 Unspecified viral hepatitis B without hepatic coma: Secondary | ICD-10-CM

## 2019-09-05 DIAGNOSIS — I495 Sick sinus syndrome: Secondary | ICD-10-CM | POA: Diagnosis not present

## 2019-09-05 DIAGNOSIS — I130 Hypertensive heart and chronic kidney disease with heart failure and stage 1 through stage 4 chronic kidney disease, or unspecified chronic kidney disease: Secondary | ICD-10-CM | POA: Diagnosis not present

## 2019-09-05 DIAGNOSIS — Z885 Allergy status to narcotic agent status: Secondary | ICD-10-CM | POA: Insufficient documentation

## 2019-09-05 DIAGNOSIS — I480 Paroxysmal atrial fibrillation: Secondary | ICD-10-CM

## 2019-09-05 DIAGNOSIS — J302 Other seasonal allergic rhinitis: Secondary | ICD-10-CM | POA: Diagnosis not present

## 2019-09-05 DIAGNOSIS — E66812 Obesity, class 2: Secondary | ICD-10-CM

## 2019-09-05 DIAGNOSIS — E7800 Pure hypercholesterolemia, unspecified: Secondary | ICD-10-CM | POA: Diagnosis not present

## 2019-09-05 DIAGNOSIS — K7460 Unspecified cirrhosis of liver: Secondary | ICD-10-CM | POA: Insufficient documentation

## 2019-09-05 DIAGNOSIS — Z6835 Body mass index (BMI) 35.0-35.9, adult: Secondary | ICD-10-CM | POA: Diagnosis not present

## 2019-09-05 DIAGNOSIS — Z7189 Other specified counseling: Secondary | ICD-10-CM | POA: Diagnosis not present

## 2019-09-05 DIAGNOSIS — I10 Essential (primary) hypertension: Secondary | ICD-10-CM

## 2019-09-05 DIAGNOSIS — J449 Chronic obstructive pulmonary disease, unspecified: Secondary | ICD-10-CM | POA: Diagnosis not present

## 2019-09-05 DIAGNOSIS — J4489 Other specified chronic obstructive pulmonary disease: Secondary | ICD-10-CM

## 2019-09-05 DIAGNOSIS — K5900 Constipation, unspecified: Secondary | ICD-10-CM | POA: Diagnosis not present

## 2019-09-05 DIAGNOSIS — I5022 Chronic systolic (congestive) heart failure: Secondary | ICD-10-CM | POA: Diagnosis not present

## 2019-09-05 DIAGNOSIS — N1832 Chronic kidney disease, stage 3b: Secondary | ICD-10-CM | POA: Diagnosis not present

## 2019-09-05 DIAGNOSIS — Z95810 Presence of automatic (implantable) cardiac defibrillator: Secondary | ICD-10-CM | POA: Insufficient documentation

## 2019-09-05 DIAGNOSIS — I428 Other cardiomyopathies: Secondary | ICD-10-CM | POA: Insufficient documentation

## 2019-09-05 DIAGNOSIS — N400 Enlarged prostate without lower urinary tract symptoms: Secondary | ICD-10-CM | POA: Diagnosis not present

## 2019-09-05 DIAGNOSIS — Z87891 Personal history of nicotine dependence: Secondary | ICD-10-CM | POA: Insufficient documentation

## 2019-09-05 DIAGNOSIS — Z79899 Other long term (current) drug therapy: Secondary | ICD-10-CM | POA: Diagnosis not present

## 2019-09-05 DIAGNOSIS — K219 Gastro-esophageal reflux disease without esophagitis: Secondary | ICD-10-CM | POA: Insufficient documentation

## 2019-09-05 DIAGNOSIS — E785 Hyperlipidemia, unspecified: Secondary | ICD-10-CM | POA: Insufficient documentation

## 2019-09-05 DIAGNOSIS — Z8249 Family history of ischemic heart disease and other diseases of the circulatory system: Secondary | ICD-10-CM | POA: Insufficient documentation

## 2019-09-05 DIAGNOSIS — Z7901 Long term (current) use of anticoagulants: Secondary | ICD-10-CM | POA: Insufficient documentation

## 2019-09-05 DIAGNOSIS — G4733 Obstructive sleep apnea (adult) (pediatric): Secondary | ICD-10-CM | POA: Insufficient documentation

## 2019-09-05 MED ORDER — BUDESONIDE-FORMOTEROL FUMARATE 160-4.5 MCG/ACT IN AERO: 2.0000 | INHALATION_SPRAY | Freq: Two times a day (BID) | RESPIRATORY_TRACT | 6 refills | Status: DC

## 2019-09-05 MED ORDER — ALBUTEROL SULFATE HFA 108 (90 BASE) MCG/ACT IN AERS: INHALATION_SPRAY | RESPIRATORY_TRACT | 6 refills | Status: DC

## 2019-09-05 MED ORDER — MOMETASONE FUROATE 50 MCG/ACT NA SUSP: 1.0000 | Freq: Every day | NASAL | 2 refills | Status: DC | PRN

## 2019-09-05 NOTE — Progress Notes (Signed)
Patient ID: ROCHELLE NEPHEW, male    DOB: 06-22-1960  MRN: [id number]  CC: Hypertension   Subjective: Leam Madero is a 59 y.o. male who presents for chronic ds management. His concerns today include:  The patient with history of COPD and asthma, HTN, CKD stage III, CAD, andNICM with chronic systolic CHF and ICD, PAF,immune active hep B,adenomatous polyps,dep/anx(followed by psychiatrist, Noemi Chapel), OSA (intolerant of CPAP mask).   Constipation: every now and then. Miralax helps.  Eating greens and drinks water  Obesity: up 8 lbs.  Not doing much exercise since COVID. Eats smaller portions; never an over eater. However, he drinks 7 cans of 12 oz regular Coco Cola.  COPD/asthma:  Out of Symbicort x 3 mths.  Needs RF on Albuterol and Nasonex.  Using Albuterol 1-3 x a wk. Finds that his allergies not to bad with having to wear mask.  Some congestion and cough in the mornings  Hep B cirrhosis:  Saw Dr. Enis Gash recently.  Still on Baraclude.  HYPERTENSION/CAD/PAF Currently taking: see medication list-medications include amiodarone, amlodipine, carvedilol, Xarelto, Zetia, hydralazine and aspirin Med Adherence: _0  Yes    _1  No Medication side effects: _2  Yes    _3  No Adherence with salt restriction: _4  Yes    _5  No Home Monitoring?: _6  Yes    _7  No Monitoring Frequency: 3 x a wk Home BP results range:  130/80s.  States he feels bad when BP lower SOB? _8  Yes which he feels is due to decondition.  Occasional PND Chest Pain?: _9  Yes    _10  No Leg swelling?: _11  Yes    _12  No Headaches?: _13  Yes    _14  No Dizziness? _15  Yes    _16  No Comments: no firing  CKD 3:  GFR decline from 48 to 36 on most recent lab test.  Saw the urologist since last visit with me.  States he was diagnosed with BPH.  Reports he is doing well on tamsulosin.  Patient Active Problem List   Diagnosis Date Noted  . BPH (benign prostatic hyperplasia) 06/19/2019  . Dental abscess 04/30/2018  .  Adenomatous polyp of colon 02/09/2018  . 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 Osteoarthritis of both knees 06/12/2017  . COPD with asthma ([place]) 03/17/2016  . Chronic seasonal allergic rhinitis 03/17/2016  . GERD (gastroesophageal reflux disease) 03/17/2016  . Chronic pain syndrome 03/16/2015  . Obstructive sleep apnea 03/16/2015  . Chronic low back pain with right-sided sciatica 03/16/2015  . Sinus brady-tachy syndrome ([place]) 02/20/2014  . Atrial fibrillation ([place]) 12/31/2013  . Automatic implantable cardioverter-defibrillator in situ 03/08/2011  . Erectile dysfunction 02/28/2011  . CAD, NATIVE VESSEL 04/26/2010  . PURE HYPERCHOLESTEROLEMIA 04/12/2010  . HLD (hyperlipidemia) 01/11/2010  . Essential hypertension, malignant 12/18/2009  . Chronic systolic heart failure ([place]) 12/18/2009     Current Outpatient Medications on File Prior to Visit  Medication Sig Dispense Refill  . alprazolam (XANAX) 2 MG tablet Take 2 mg by mouth every 6 (six) hours as needed for anxiety.     Marland Kitchen amiodarone (PACERONE) 200 MG tablet TAKE 1 TABLET(200 MG) BY MOUTH DAILY 90 tablet 2  . amLODipine (NORVASC) 10 MG tablet TAKE 1 TABLET BY MOUTH EVERY DAY 30 tablet 6  . aspirin 81 MG EC tablet Take 81 mg by mouth at bedtime.     . carvedilol (COREG) 12.5 MG tablet TAKE 1 TABLET(12.5 MG) BY MOUTH TWICE DAILY WITH A MEAL 180 tablet 3  .  DULoxetine (CYMBALTA) 60 MG capsule Take 60 mg by mouth daily.    Marland Kitchen entecavir (BARACLUDE) 0.5 MG tablet Take 1 tablet (0.5 mg total) by mouth every other day. Please note: Every OTHER day 90 tablet 1  . ezetimibe (ZETIA) 10 MG tablet Take 1 tablet (10 mg total) by mouth daily. 30 tablet 11  . ferrous sulfate 325 (65 FE) MG tablet Take 325 mg by mouth daily with breakfast.    . hydrALAZINE (APRESOLINE) 50 MG tablet Take 1 tablet (50 mg total) by mouth 2 (two) times daily. 180 tablet 3  . ondansetron (ZOFRAN) 4 MG tablet Take 4 mg by mouth 2 (two) times  daily as needed for nausea or vomiting.   0  . sildenafil (REVATIO) 20 MG tablet TAKE TWO TO FIVE TABLETS BY MOUTH DAILY AS NEEDED FOR ERECTILE DYSFUNCTION 100 tablet 0  . tamsulosin (FLOMAX) 0.4 MG CAPS capsule TAKE 1 CAPSULE(0.4 MG) BY MOUTH DAILY 30 capsule 2  . XARELTO 20 MG TABS tablet TAKE 1 TABLET(20 MG) BY MOUTH DAILY WITH SUPPER 90 tablet 1   No current facility-administered medications on file prior to visit.    Allergies  Allergen Reactions  . Codeine Itching, Nausea And Vomiting and Hives  . Hydrocodone-Acetaminophen Itching and Nausea And Vomiting    Social History   Socioeconomic History  . Marital status: Divorced    Spouse name: Not on file  . Number of children: 2  . Years of education: Not on file  . Highest education level: Not on file  Occupational History  . Occupation: Merchandiser, retail: UNEMPLOYED    Comment: Writer in the past  Tobacco Use  . Smoking status: Former Smoker    Packs/day: 0.50    Years: 0.50    Pack years: 0.25    Types: Cigarettes    Quit date: 05/02/2010    Years since quitting: 9.3  . Smokeless tobacco: Never Used  . Tobacco comment: Significant Second-hand and only 3 months himself  Substance and Sexual Activity  . Alcohol use: Not Currently    Alcohol/week: 0.0 standard drinks    Comment: rare  . Drug use: No    Comment: Reported history of cocaine abuse off and on   . Sexual activity: Not on file  Other Topics Concern  . Not on file  Social History Narrative   Living with a son who is a 42 year old.  He is not     working, unemployed for 3 years.  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  Denied use of alcohol.  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       [facility]:   Originally from [place], [place]. He grew up in [place]. He moved to [place] in 1985. 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 No pets currently. No bird  exposure. He did have mold in a previous home. No hot tub exposure.         Social Determinants of Health   Financial Resource Strain:   . Difficulty of Paying Living Expenses:   Food Insecurity:   . Worried About Charity fundraiser in the Last Year:   . Arboriculturist in the Last Year:   Transportation Needs:   . Film/video editor (Medical):   Marland Kitchen Lack of Transportation (Non-Medical):   Physical Activity:   . Days of Exercise  per Week:   . Minutes of Exercise per Session:   Stress:   . Feeling of Stress :   Social Connections:   . Frequency of Communication with Friends and Family:   . Frequency of Social Gatherings with Friends and Family:   . Attends Religious Services:   . Active Member of Clubs or Organizations:   . Attends Archivist Meetings:   Marland Kitchen Marital Status:   Intimate Partner Violence:   . Fear of Current or Ex-Partner:   . Emotionally Abused:   Marland Kitchen Physically Abused:   . Sexually Abused:     Family History  Problem Relation Age of Onset  . Breast cancer Mother   . Multiple myeloma Mother   . Prostate cancer Father   . Diabetes Father   . Peripheral vascular disease Father   . Cerebral palsy Daughter   . Lung disease Neg Hx   . Colon cancer Neg Hx   . Stomach cancer Neg Hx   . Esophageal cancer Neg Hx   . Colon polyps Neg Hx     Past Surgical History:  Procedure Laterality Date  . CARDIOVERSION N/A 01/07/2014   Procedure: CARDIOVERSION;  Surgeon: Lelon Perla, MD;  [facility];  Service: Cardiovascular;  Laterality: N/A;  . CARDIOVERSION N/A 02/11/2014   Procedure: CARDIOVERSION;  Surgeon: Pixie Casino, MD;  Location: [facility];  Service: Cardiovascular;  Laterality: N/A;  . COLONOSCOPY WITH PROPOFOL N/A 02/06/2018   Procedure: COLONOSCOPY WITH PROPOFOL;  Surgeon: Yetta Flock, MD;  Location: [facility] ENDOSCOPY;  Service: Gastroenterology;  Laterality: N/A;  . LASEK eye surgery Bilateral 05/2018  . LASIK Bilateral     . PACEMAKER INSERTION    . POLYPECTOMY  02/06/2018   Procedure: POLYPECTOMY;  Surgeon: Yetta Flock, MD;  Location: [facility] ENDOSCOPY;  Service: Gastroenterology;;  . TEE WITHOUT CARDIOVERSION N/A 01/07/2014   Procedure: TRANSESOPHAGEAL ECHOCARDIOGRAM (TEE);  Surgeon: Lelon Perla, MD;  Location: [facility] ENDOSCOPY;  Service: Cardiovascular;  Laterality: N/A;  . TEE WITHOUT CARDIOVERSION N/A 02/11/2014   Procedure: TRANSESOPHAGEAL ECHOCARDIOGRAM (TEE);  Surgeon: Pixie Casino, MD;  Location: [facility] ENDOSCOPY;  Service: Cardiovascular;  Laterality: N/A;  . TONSILLECTOMY    . TRANSTHORACIC ECHOCARDIOGRAM  10/2006, 12/2009    ROS: Review of Systems Negative except as stated above  PHYSICAL EXAM: BP 134/82   Pulse 61   Temp 98.1 F (36.7 C)   Resp 16   Wt 282 lb 9.6 oz (128.2 kg)   SpO2 95%   BMI 35.32 kg/m   Wt Readings from Last 3 Encounters:  09/05/19 282 lb 9.6 oz (128.2 kg)  06/27/19 276 lb 9.6 oz (125.5 kg)  04/15/19 274 lb (124.3 kg)    Physical Exam  General appearance - alert, well appearing, and in no distress Mental status - normal mood, behavior, speech, dress, motor activity, and thought processes Mouth - mucous membranes moist, pharynx normal without lesions Neck - supple, no significant adenopathy Chest - clear to auscultation, no wheezes, rales or rhonchi, symmetric air entry Heart - normal rate, regular rhythm, normal S1, S2, no murmurs, rubs, clicks or gallops Extremities - peripheral pulses normal, no pedal edema, no clubbing or cyanosis  CMP Latest Ref Rng & Units 06/19/2019 04/15/2019 10/24/2018  Glucose 70 - 99 mg/dL 110(H) 96 93  BUN 6 - 23 mg/dL 28(H) 25(H) 30(H)  Creatinine 0.40 - 1.50 mg/dL 1.91(H) 1.80(H) 2.06(H)  Sodium 135 - 145 mEq/L 139 142 140  Potassium 3.5 - 5.1  mEq/L 3.9 4.0 3.9  Chloride 96 - 112 mEq/L 103 102 102  CO2 19 - 32 mEq/L _0 Calcium 8.4 - 10.5 mg/dL 9.5 9.7 9.7  Total Protein 6.0 - 8.5 g/dL - 7.0 -  Total Bilirubin 0.0 -  1.2 mg/dL - 0.3 -  Alkaline Phos 39 - 117 IU/L - 87 -  AST 0 - 40 IU/L - 15 -  ALT 0 - 44 IU/L - 10 -   Lipid Panel     Component Value Date/Time   CHOL 180 07/09/2018 1527   TRIG 118 07/09/2018 1527   HDL 40 07/09/2018 1527   CHOLHDL 4.5 07/09/2018 1527   CHOLHDL 3.9 10/12/2015 1026   VLDL 27 10/12/2015 1026   LDLCALC 116 (H) 07/09/2018 1527    CBC    Component Value Date/Time   WBC 7.0 07/09/2018 1527   WBC 14.3 (H) 05/20/2017 1143   RBC 5.18 07/09/2018 1527   RBC 5.19 05/20/2017 1143   HGB 16.4 07/09/2018 1527   HCT 47.9 07/09/2018 1527   PLT 185 07/09/2018 1527   MCV 93 07/09/2018 1527   MCH 31.7 07/09/2018 1527   MCH 33.3 05/20/2017 1143   MCHC 34.2 07/09/2018 1527   MCHC 36.0 05/20/2017 1143   RDW 13.5 07/09/2018 1527   LYMPHSABS 1.6 05/30/2017 1430   MONOABS 820 01/26/2016 1025   EOSABS 0.1 05/30/2017 1430   BASOSABS 0.0 05/30/2017 1430    ASSESSMENT AND PLAN: 1. Class 2 severe obesity due to excess calories with serious comorbidity and body mass index (BMI) of 35.0 to 35.9 in adult [facility]) Discussed healthy eating habits and regular exercise.  Advised patient to cut back on the amount of Coca-Cola that he is consuming every day which to amounts to over 1000 cal on just diet alone. Encouraged him to try to move more.  If he can only walk for 10 minutes daily he should try to do so  2. Essential hypertension Close to goal.  Continue current medications and low-salt diet  3. 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  4. COPD with asthma ([place]) Stable but needs refill on Symbicort - albuterol (VENTOLIN HFA) 108 (90 Base) MCG/ACT inhaler; INHALE 2 PUFFS INTO THE LUNGS EVERY 6 HOURS AS NEEDED FOR WHEEZING OR SHORTNESS OF BREATH  Dispense: 8.5 g; Refill: 6 - budesonide-formoterol (SYMBICORT) 160-4.5 MCG/ACT inhaler; Inhale 2 puffs into the lungs 2 (two) times daily.  Dispense: 10.2 g; Refill: 6  5. Seasonal allergies - mometasone (NASONEX) 50  MCG/ACT nasal spray; Place 1 spray into the nose daily as needed.  Dispense: 17 g; Refill: 2  6. PAF (paroxysmal atrial fibrillation) ([facility]) On Xarelto carvedilol and amiodarone.  7. Chronic systolic heart failure ([place]) Compensated.  Continue current medications  8. Benign prostatic hyperplasia without lower urinary tract symptoms Doing well on Flomax  9. Stage 3b chronic kidney disease Continue to monitor.  Avoid NSAIDs.  7. Educated about COVID-19 virus infection Encourage patient to get the COVID-19 vaccine.   Patient was given the opportunity to ask questions.  Patient verbalized understanding of the plan and was able to repeat key elements of the plan.   No orders of the defined types were placed in this encounter.    Requested Prescriptions   Signed Prescriptions Disp Refills  . albuterol (VENTOLIN HFA) 108 (90 Base) MCG/ACT inhaler 8.5 g 6    Sig: INHALE 2 PUFFS INTO THE LUNGS EVERY 6 HOURS AS NEEDED FOR  WHEEZING OR SHORTNESS OF BREATH  . budesonide-formoterol (SYMBICORT) 160-4.5 MCG/ACT inhaler 10.2 g 6    Sig: Inhale 2 puffs into the lungs 2 (two) times daily.  . mometasone (NASONEX) 50 MCG/ACT nasal spray 17 g 2    Sig: Place 1 spray into the nose daily as needed.    Return in about 4 months (around 01/06/2020).  Karle Plumber, MD, FACP

## 2019-09-06 ENCOUNTER — Ambulatory Visit (INDEPENDENT_AMBULATORY_CARE_PROVIDER_SITE_OTHER): Payer: Medicaid Other | Admitting: Ophthalmology

## 2019-09-06 DIAGNOSIS — Z961 Presence of intraocular lens: Secondary | ICD-10-CM

## 2019-09-06 DIAGNOSIS — H43812 Vitreous degeneration, left eye: Secondary | ICD-10-CM | POA: Diagnosis not present

## 2019-09-06 DIAGNOSIS — I10 Essential (primary) hypertension: Secondary | ICD-10-CM | POA: Diagnosis not present

## 2019-09-06 DIAGNOSIS — H34231 Retinal artery branch occlusion, right eye: Secondary | ICD-10-CM

## 2019-09-06 DIAGNOSIS — H35033 Hypertensive retinopathy, bilateral: Secondary | ICD-10-CM | POA: Diagnosis not present

## 2019-09-06 DIAGNOSIS — H4312 Vitreous hemorrhage, left eye: Secondary | ICD-10-CM

## 2019-09-06 DIAGNOSIS — H3581 Retinal edema: Secondary | ICD-10-CM

## 2019-09-09 ENCOUNTER — Encounter (INDEPENDENT_AMBULATORY_CARE_PROVIDER_SITE_OTHER): Payer: Self-pay | Admitting: Ophthalmology

## 2019-09-13 ENCOUNTER — Telehealth: Payer: Self-pay

## 2019-09-13 NOTE — Telephone Encounter (Signed)
Anucort suppository requires a PA with [place] tracks. Unable to obtain.  Per Dr. Havery Moros, he can substitute with OTC 1% hydrocortisone cream on a glycerin suppository.  Sent instruction to pharmacy.

## 2019-09-17 ENCOUNTER — Other Ambulatory Visit: Payer: Self-pay | Admitting: Cardiovascular Disease

## 2019-09-20 ENCOUNTER — Telehealth: Payer: Self-pay

## 2019-09-20 NOTE — Telephone Encounter (Signed)
Nasonex/Mometasone nasal spray is non-preferred under Medicaid.  Pt must have tried and failed 2 preferred drugs.  He has a history of Flonase, which is one.  His other options are Astelin, Atrovent, Astepro, and Patanase.  I don't see a history of having tried these 4 drugs, if appropriate can you change therapy?

## 2019-09-23 ENCOUNTER — Telehealth: Payer: Self-pay | Admitting: Internal Medicine

## 2019-09-23 MED ORDER — FLUTICASONE PROPIONATE 50 MCG/ACT NA SUSP
1.0000 | Freq: Every day | NASAL | 2 refills | Status: DC
Start: 2019-09-23 — End: 2020-03-19

## 2019-09-23 NOTE — Telephone Encounter (Signed)
Patient said it fine if he is switched to flonase nasal spray.

## 2019-09-23 NOTE — Telephone Encounter (Signed)
Contacted pt to go over Dr. Wynetta Emery message pt didn't answer lvm asking pt to give a call back at his earliest convenience

## 2019-09-23 NOTE — Telephone Encounter (Signed)
Will forward to pcp

## 2019-10-02 ENCOUNTER — Other Ambulatory Visit: Payer: Self-pay

## 2019-10-02 MED ORDER — ENTECAVIR 0.5 MG PO TABS: 0.5000 mg | ORAL_TABLET | ORAL | 1 refills | Status: DC

## 2019-10-02 NOTE — Progress Notes (Signed)
rec'd fax request to refill Entecavir 0.5mg  every other day.  Patient seen in 06-2019.

## 2019-10-03 NOTE — Progress Notes (Signed)
[facility] Note  10/04/2019     CHIEF COMPLAINT Patient presents for Retina Follow Up   HISTORY OF PRESENT ILLNESS: Cody Ortega is a 59 y.o. male who presents to the clinic today for:   HPI    Retina Follow Up    Patient presents with  PVD.  In left eye.  Severity is moderate.  Duration of 4 weeks.  Since onset it is stable.  I, the attending physician,  performed the HPI with the patient and updated documentation appropriately.          Comments    Pt here for 4 week f/u for hemorrhagic PVD OS, he states he has "good days and bad days", some days he feels like his vision is very good other days he feels like there is a haze over both eyes       Last edited by Bernarda Caffey, MD on 10/04/2019 10:13 AM. (History)    pt states he has "good days and bad days" with his vision  Referring physician: Ladell Pier, MD [place],  [place] [id number]  HISTORICAL INFORMATION:   Selected notes from the MEDICAL RECORD NUMBER Referred by Dr. [name] for decreased vision and floaters x3 days LEE:  Ocular Hx- PMH   CURRENT MEDICATIONS: Current Outpatient Medications (Ophthalmic Drugs)  Medication Sig  . cycloSPORINE (RESTASIS) 0.05 % ophthalmic emulsion Place 1 drop into both eyes 2 (two) times daily.   No current facility-administered medications for this visit. (Ophthalmic Drugs)   Current Outpatient Medications (Other)  Medication Sig  . albuterol (VENTOLIN HFA) 108 (90 Base) MCG/ACT inhaler INHALE 2 PUFFS INTO THE LUNGS EVERY 6 HOURS AS NEEDED FOR WHEEZING OR SHORTNESS OF BREATH  . alprazolam (XANAX) 2 MG tablet Take 2 mg by mouth every 6 (six) hours as needed for anxiety.   Marland Kitchen amiodarone (PACERONE) 200 MG tablet TAKE 1 TABLET(200 MG) BY MOUTH DAILY  . amLODipine (NORVASC) 10 MG tablet TAKE 1 TABLET BY MOUTH EVERY DAY  . aspirin 81 MG EC tablet Take 81 mg by mouth at bedtime.   . budesonide-formoterol (SYMBICORT) 160-4.5 MCG/ACT  inhaler Inhale 2 puffs into the lungs 2 (two) times daily.  . carvedilol (COREG) 12.5 MG tablet TAKE 1 TABLET(12.5 MG) BY MOUTH TWICE DAILY WITH A MEAL  . DULoxetine (CYMBALTA) 60 MG capsule Take 60 mg by mouth daily.  Marland Kitchen entecavir (BARACLUDE) 0.5 MG tablet Take 1 tablet (0.5 mg total) by mouth every other day. Please note: Every OTHER day. Please call in July and schedule a 6 month follow up in August. Thank you  . ezetimibe (ZETIA) 10 MG tablet Take 1 tablet (10 mg total) by mouth daily.  . ferrous sulfate 325 (65 FE) MG tablet Take 325 mg by mouth daily with breakfast.  . fluticasone (FLONASE) 50 MCG/ACT nasal spray Place 1 spray into both nostrils daily.  . hydrALAZINE (APRESOLINE) 50 MG tablet Take 1 tablet (50 mg total) by mouth 2 (two) times daily.  . ondansetron (ZOFRAN) 4 MG tablet Take 4 mg by mouth 2 (two) times daily as needed for nausea or vomiting.   . sildenafil (REVATIO) 20 MG tablet TAKE TWO TO FIVE TABLETS BY MOUTH DAILY AS NEEDED FOR ERECTILE DYSFUNCTION  . tamsulosin (FLOMAX) 0.4 MG CAPS capsule TAKE 1 CAPSULE(0.4 MG) BY MOUTH DAILY  . XARELTO 20 MG TABS tablet TAKE 1 TABLET(20 MG) BY MOUTH DAILY WITH SUPPER   No current facility-administered  medications for this visit. (Other)      REVIEW OF SYSTEMS: ROS    Positive for: Cardiovascular, Eyes   Negative for: Constitutional, Gastrointestinal, Neurological, Skin, Genitourinary, Musculoskeletal, HENT, Endocrine, Respiratory, Psychiatric, Allergic/Imm, Heme/Lymph   Last edited by Debbrah Alar, COT on 10/04/2019  9:45 AM. (History)       ALLERGIES Allergies  Allergen Reactions  . Codeine Itching, Nausea And Vomiting and Hives  . Hydrocodone-Acetaminophen Itching and Nausea And Vomiting    PAST MEDICAL HISTORY Past Medical History:  Diagnosis Date  . Anxiety   . Arthritis   . Benzodiazepine dependence ([place])   . Bronchial asthma   . CAD (coronary artery disease)    a. Cath 03/2010: mod RCA stenosis, severe diag  stenosis, treated medically given lack of angina.  . 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 Chronic systolic congestive heart failure (HCC)    Acute decompensation, LVEF less than 20%  . 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 CKD (chronic kidney disease), stage IV (HCC)    Stage 3-4  . Depression   . Diverticulosis   . Hepatitis B   . History of cocaine abuse ([place])   . Hypertension   . Microcytic anemia   . PAF (paroxysmal atrial fibrillation) ([place])    a. Episode in 2008 with spontaneous conversion  . S/P implantation of automatic cardioverter/defibrillator (AICD)    a. Medtronic, implanted 2012.  . Sleep apnea   . Stroke [facility]) 2004  . Thyroid disease    Past Surgical History:  Procedure Laterality Date  . CARDIOVERSION N/A 01/07/2014   Procedure: CARDIOVERSION;  Surgeon: Lelon Perla, MD;  Location[facility] ENDOSCOPY;  Service: Cardiovascular;  Laterality: N/A;  . CARDIOVERSION N/A 02/11/2014   Procedure: CARDIOVERSION;  Surgeon: Pixie Casino, MD;  Location: [place];  Service: Cardiovascular;  Laterality: N/A;  . COLONOSCOPY WITH PROPOFOL N/A 02/06/2018   Procedure: COLONOSCOPY WITH PROPOFOL;  Surgeon: Yetta Flock, MD;  Location: [facility] ENDOSCOPY;  Service: Gastroenterology;  Laterality: N/A;  . LASEK eye surgery Bilateral 05/2018  . LASIK Bilateral   . PACEMAKER INSERTION    . POLYPECTOMY  02/06/2018   Procedure: POLYPECTOMY;  Surgeon: Yetta Flock, MD;  Location: [facility] ENDOSCOPY;  Service: Gastroenterology;;  . TEE WITHOUT CARDIOVERSION N/A 01/07/2014   Procedure: TRANSESOPHAGEAL ECHOCARDIOGRAM (TEE);  Surgeon: Lelon Perla, MD;  Location: [facility] ENDOSCOPY;  Service: Cardiovascular;  Laterality: N/A;  . TEE WITHOUT CARDIOVERSION N/A 02/11/2014   Procedure: TRANSESOPHAGEAL ECHOCARDIOGRAM (TEE);  Surgeon: Pixie Casino, MD;  Location[facility] ENDOSCOPY;  Service: Cardiovascular;  Laterality: N/A;  . TONSILLECTOMY    . TRANSTHORACIC ECHOCARDIOGRAM   10/2006, 12/2009    FAMILY HISTORY Family History  Problem Relation Age of Onset  . Breast cancer Mother   . Multiple myeloma Mother   . Prostate cancer Father   . Diabetes Father   . Peripheral vascular disease Father   . Cerebral palsy Daughter   . Lung disease Neg Hx   . Colon cancer Neg Hx   . Stomach cancer Neg Hx   . Esophageal cancer Neg Hx   . Colon polyps Neg Hx     SOCIAL HISTORY Social History   Tobacco Use  . Smoking status: Former Smoker    Packs/day: 0.50    Years: 0.50    Pack years: 0.25    Types: Cigarettes    Quit date: 05/02/2010    Years since quitting: 9.4  .  Smokeless tobacco: Never Used  . Tobacco comment: Significant Second-hand and only 3 months himself  Substance Use Topics  . Alcohol use: Not Currently    Alcohol/week: 0.0 standard drinks    Comment: rare  . Drug use: No    Comment: Reported history of cocaine abuse off and on          OPHTHALMIC EXAM:  Base Eye Exam    Visual Acuity (Snellen - Linear)      Right Left   Dist [place] 20/40 +1 20/20 -2   Dist ph [place] 20/25 -1 20/20 -2       Tonometry (Tonopen, 9:51 AM)      Right Left   Pressure 12 14       Pupils      Dark Light Shape React APD   Right 3 2 Round Brisk None   Left 3 2 Round Brisk None       Visual Fields (Counting fingers)      Left Right    Full Full       Extraocular Movement      Right Left    Full, Ortho Full, Ortho       Neuro/Psych    Oriented x3: Yes   Mood/Affect: Normal       Dilation    Both eyes: 1.0% Mydriacyl, 2.5% Phenylephrine @ 9:52 AM        Slit Lamp and Fundus Exam    Slit Lamp Exam      Right Left   Lids/Lashes Dermatochalasis - upper lid, mild Meibomian gland dysfunction Dermatochalasis - upper lid, mild Meibomian gland dysfunction   Conjunctiva/Sclera White and quiet White and quiet   Cornea Mild arcus, 1+Punctate epithelial erosions, well healed temporal cataract wounds Debris in tear film, well healed temporal cataract  wounds, mild Arcus, trace Punctate epithelial erosions   Anterior Chamber Deep and quiet Deep and quiet   Iris Round and dilated Round and dilated   Lens PC IOL in good position PC IOL in good position   Vitreous Vitreous syneresis Vitreous syneresis, +RBC in anterior vit, blood stained vitreous condensations clearing and settling inferiorly, Posterior vitreous detachment       Fundus Exam      Right Left   Disc Inferior Pallor, vascular loops Mild Pallor, Pink and Sharp   C/D Ratio 0.3 0.4   Macula Flat, Blunted foveal reflex, mild Retinal pigment epithelial mottling Flat, good foveal reflex, focal RPE disruption IN fovea, no heme or edema   Vessels Severe Vascular attenuation, superior hemisphere with telangectasias and perivascular exudation / sheathing Vascular attenuation, mild Tortuousity   Periphery Attached    Attached, inferior lattice, peripheral cystoid degeneration, No RT/RD on 360 scleral depression           IMAGING AND PROCEDURES  Imaging and Procedures for '@TODAY'$ @  OCT, Retina - OU - Both Eyes       Right Eye Quality was good. Central Foveal Thickness: 231. Progression has been stable. 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   Left Eye Quality was good. Central Foveal Thickness: 235. Progression has been stable. Findings include normal foveal contour, no SRF, no IRF (Focal ellipsoid disruption inferonasal fovea, interval improvement in vitreous opacities - essentially resolved).   Notes *Images captured and stored on drive  Diagnosis / Impression:  OD: Inner retinal atrophy, diffuse inner macular atrophy inferior greater than superior--remote BRAO OS:  Focal ellipsoid disruption inferonasal fovea, interval improvement in vitreous opacities - essentially resolved  Clinical management:  See below  Abbreviations: NFP - Normal foveal profile. CME -  cystoid macular edema. PED - pigment epithelial detachment. IRF - intraretinal fluid. SRF - subretinal fluid. EZ - ellipsoid zone. ERM - epiretinal membrane. ORA - outer retinal atrophy. ORT - outer retinal tubulation. SRHM - subretinal hyper-reflective material                 ASSESSMENT/PLAN:    ICD-10-CM   1. Posterior vitreous detachment of left eye  H43.812   2. Vitreous hemorrhage of left eye (HCC)  H43.12   3. Retinal artery branch occlusion of right eye  H34.231   4. Retinal edema  H35.81 OCT, Retina - OU - Both Eyes  5. Essential hypertension  I10   6. Hypertensive retinopathy of both eyes  H35.033   7. Pseudophakia of both eyes  Z96.1     1,2. Hemorrhagic PVD OS  - Onset late March 2021 -- presented to Dr. Jerilynn Mages. Le per pt history  - blood stained vit clearing and settling inferiorly  - Discussed findings and prognosis  - No RT or RD on 360 scleral depressed exam  - Reviewed s/s of RT/RD  - Strict return precautions for any such RT/RD signs/symptoms  - VH precautions reviewed -- minimize activities, keep head elevated, avoid ASA/NSAIDs/blood thinners as able  - F/U 6-8 weeks  3,4. 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 Hypertensive retinopathy OU  - discussed importance of tight BP control  - monitor  7. Pseudophakia OU  - s/p CE/IOL OU  - beautiful surgery, doing well  - monitor   Ophthalmic Meds Ordered this visit:  No orders of the defined types were placed in this encounter.      Return for f/u 6-8 weeks, hemorrhagic PVD OS, DFE, OCT.  There are no Patient Instructions on file for this visit.   Explained the diagnoses, plan, and follow up with the patient and they expressed understanding.  Patient expressed understanding of the importance of proper follow up care.   This document serves as a record of services personally performed by Gardiner Sleeper, MD, PhD. It  was created on their behalf by Ernest Mallick, OA, an ophthalmic assistant. The creation of this record is the provider's dictation and/or activities during the visit.    Electronically signed by: Ernest Mallick, OA  06.03.2021 2:44 AM   Gardiner Sleeper, M.D., Ph.D. Diseases & Surgery of the Retina and Vitreous [facility]  I have reviewed the above documentation for accuracy and completeness, and I agree with the above. Gardiner Sleeper, M.D., Ph.D. 10/06/19 2:45 AM   Abbreviations: M myopia (nearsighted); A astigmatism; H hyperopia (farsighted); P presbyopia; Mrx spectacle prescription;  CTL contact lenses; OD right eye; OS left eye; OU both eyes  XT exotropia; ET esotropia; PEK punctate epithelial keratitis; PEE punctate epithelial erosions; DES dry eye syndrome; MGD meibomian gland dysfunction; ATs artificial tears; PFAT's preservative free artificial tears; [place] nuclear sclerotic cataract; PSC posterior subcapsular cataract; ERM epi-retinal membrane; PVD posterior vitreous detachment; RD retinal detachment; DM diabetes mellitus; DR diabetic retinopathy; NPDR non-proliferative diabetic retinopathy; PDR proliferative diabetic retinopathy; CSME clinically significant macular edema; DME diabetic macular edema; dbh dot blot hemorrhages; CWS cotton wool spot; POAG primary open angle glaucoma; C/D cup-to-disc  ratio; HVF humphrey visual field; GVF goldmann visual field; OCT optical coherence tomography; IOP intraocular pressure; BRVO Branch retinal vein occlusion; CRVO central retinal vein occlusion; CRAO central retinal artery occlusion; BRAO branch retinal artery occlusion; RT retinal tear; SB scleral buckle; PPV pars plana vitrectomy; VH Vitreous hemorrhage; PRP panretinal laser photocoagulation; IVK intravitreal kenalog; VMT vitreomacular traction; MH Macular hole;  NVD neovascularization of the disc; NVE neovascularization elsewhere; AREDS age related eye disease study; ARMD age  related macular degeneration; POAG primary open angle glaucoma; EBMD epithelial/anterior basement membrane dystrophy; ACIOL anterior chamber intraocular lens; IOL intraocular lens; PCIOL posterior chamber intraocular lens; Phaco/IOL phacoemulsification with intraocular lens placement; [place] photorefractive keratectomy; LASIK laser assisted in situ keratomileusis; HTN hypertension; DM diabetes mellitus; COPD chronic obstructive pulmonary disease

## 2019-10-04 ENCOUNTER — Encounter (INDEPENDENT_AMBULATORY_CARE_PROVIDER_SITE_OTHER): Payer: Self-pay | Admitting: Ophthalmology

## 2019-10-04 ENCOUNTER — Ambulatory Visit (INDEPENDENT_AMBULATORY_CARE_PROVIDER_SITE_OTHER): Payer: Medicaid Other | Admitting: Ophthalmology

## 2019-10-04 ENCOUNTER — Other Ambulatory Visit: Payer: Self-pay

## 2019-10-04 DIAGNOSIS — H35033 Hypertensive retinopathy, bilateral: Secondary | ICD-10-CM | POA: Diagnosis not present

## 2019-10-04 DIAGNOSIS — Z961 Presence of intraocular lens: Secondary | ICD-10-CM

## 2019-10-04 DIAGNOSIS — H4312 Vitreous hemorrhage, left eye: Secondary | ICD-10-CM

## 2019-10-04 DIAGNOSIS — H34231 Retinal artery branch occlusion, right eye: Secondary | ICD-10-CM

## 2019-10-04 DIAGNOSIS — H3581 Retinal edema: Secondary | ICD-10-CM | POA: Diagnosis not present

## 2019-10-04 DIAGNOSIS — H43812 Vitreous degeneration, left eye: Secondary | ICD-10-CM | POA: Diagnosis not present

## 2019-10-04 DIAGNOSIS — I10 Essential (primary) hypertension: Secondary | ICD-10-CM

## 2019-10-09 ENCOUNTER — Other Ambulatory Visit: Payer: Self-pay | Admitting: Cardiovascular Disease

## 2019-10-15 ENCOUNTER — Other Ambulatory Visit: Payer: Self-pay

## 2019-10-15 ENCOUNTER — Encounter: Payer: Self-pay | Admitting: Internal Medicine

## 2019-10-15 ENCOUNTER — Ambulatory Visit: Payer: Medicaid Other | Admitting: Internal Medicine

## 2019-10-15 VITALS — BP 102/62 | HR 63 | Ht 75.0 in | Wt 280.0 lb

## 2019-10-15 DIAGNOSIS — I480 Paroxysmal atrial fibrillation: Secondary | ICD-10-CM

## 2019-10-15 DIAGNOSIS — Z95810 Presence of automatic (implantable) cardiac defibrillator: Secondary | ICD-10-CM | POA: Diagnosis not present

## 2019-10-15 DIAGNOSIS — I5022 Chronic systolic (congestive) heart failure: Secondary | ICD-10-CM | POA: Diagnosis not present

## 2019-10-15 MED ORDER — AMIODARONE HCL 200 MG PO TABS
ORAL_TABLET | ORAL | 3 refills | Status: DC
Start: 2019-10-15 — End: 2020-05-07

## 2019-10-15 NOTE — Patient Instructions (Addendum)
Medication Instructions:  Your physician has recommended you make the following change in your medication:  1.  Reduce your amiodarone 200 mg-  Take one tablet by mouth daily Monday through Saturday.  Do NOT take on Sunday.  2.  STOP taking ASPIRIN   Labwork: None ordered.  Testing/Procedures: None ordered.  Follow-Up:  Cody Ortega will call you to set up your post procedure visits.  Any Other Special Instructions Will Be Listed Below (If Applicable).  If you need a refill on your cardiac medications before your next appointment, please call your pharmacy.   ICD GENERATOR CHANGE INSTRUCTIONS:  January 13, 2020-Come to [facility] office for lab work between 8:00 am and 4:30 pm.  You do not need to be fasting.  Please arrive at the [facility] main entrance of [facility] hospital at:  9:30 AM ON January 17, 2020.  Check in at Admitting down the hall from [place] parking.  [place] not eat or drink after midnight prior to procedure  Do not take any medications the morning of the procedure. DO NOT TAKE YOUR Xarelto FOR 2 DAYS PRIOR TO YOUR PROCEDURE.  LAST DOSE January 14, 2020.  You will be discharged after your procedure.   You will need someone to drive you home at discharge.  Use the surgical scrub.

## 2019-10-15 NOTE — Progress Notes (Signed)
HPI Cody Ortega returns today for followup. He is a pleasant 59 yo man with an ICM, s/p MI, chronic class 2 CHF, persistent atrial fib who has maintained NSR on amiodarone. He denies chest pain. He admits to being sedentary during Covid. He has not been vaccinated. He denies syncope or ICD therapy. He has mild peripheral edema.  Allergies  Allergen Reactions  . Codeine Itching, Nausea And Vomiting and Hives  . Hydrocodone-Acetaminophen Itching and Nausea And Vomiting     Current Outpatient Medications  Medication Sig Dispense Refill  . albuterol (VENTOLIN HFA) 108 (90 Base) MCG/ACT inhaler INHALE 2 PUFFS INTO THE LUNGS EVERY 6 HOURS AS NEEDED FOR WHEEZING OR SHORTNESS OF BREATH 8.5 g 6  . alprazolam (XANAX) 2 MG tablet Take 2 mg by mouth every 6 (six) hours as needed for anxiety.     Marland Kitchen amLODipine (NORVASC) 10 MG tablet TAKE 1 TABLET BY MOUTH EVERY DAY 30 tablet 6  . aspirin 81 MG EC tablet Take 81 mg by mouth at bedtime.     . budesonide-formoterol (SYMBICORT) 160-4.5 MCG/ACT inhaler Inhale 2 puffs into the lungs 2 (two) times daily. 10.2 g 6  . carvedilol (COREG) 12.5 MG tablet TAKE 1 TABLET(12.5 MG) BY MOUTH TWICE DAILY WITH A MEAL 180 tablet 3  . cycloSPORINE (RESTASIS) 0.05 % ophthalmic emulsion Place 1 drop into both eyes 2 (two) times daily.    . DULoxetine (CYMBALTA) 60 MG capsule Take 60 mg by mouth daily.    Marland Kitchen entecavir (BARACLUDE) 0.5 MG tablet Take 1 tablet (0.5 mg total) by mouth every other day. Please note: Every OTHER day. Please call in July and schedule a 6 month follow up in August. Thank you 45 tablet 1  . ezetimibe (ZETIA) 10 MG tablet TAKE 1 TABLET(10 MG) BY MOUTH DAILY 30 tablet 5  . ferrous sulfate 325 (65 FE) MG tablet Take 325 mg by mouth daily with breakfast.    . fluticasone (FLONASE) 50 MCG/ACT nasal spray Place 1 spray into both nostrils daily. 16 g 2  . hydrALAZINE (APRESOLINE) 50 MG tablet Take 1 tablet (50 mg total) by mouth 2 (two) times daily. 180  tablet 3  . ondansetron (ZOFRAN) 4 MG tablet Take 4 mg by mouth 2 (two) times daily as needed for nausea or vomiting.   0  . sildenafil (REVATIO) 20 MG tablet TAKE TWO TO FIVE TABLETS BY MOUTH DAILY AS NEEDED FOR ERECTILE DYSFUNCTION 100 tablet 0  . tamsulosin (FLOMAX) 0.4 MG CAPS capsule TAKE 1 CAPSULE(0.4 MG) BY MOUTH DAILY 30 capsule 2  . XARELTO 20 MG TABS tablet TAKE 1 TABLET(20 MG) BY MOUTH DAILY WITH SUPPER 90 tablet 1  . amiodarone (PACERONE) 200 MG tablet Take one tablet by mouth daily Monday through Saturday.  Do NOT take on Sunday. 90 tablet 3   No current facility-administered medications for this visit.     Past Medical History:  Diagnosis Date  . Anxiety   . Arthritis   . Benzodiazepine dependence ([place])   . Bronchial asthma   . CAD (coronary artery disease)    a. Cath 03/2010: mod RCA stenosis, severe diag stenosis, treated medically given lack of angina.  . 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 Chronic systolic congestive heart failure (HCC)    Acute decompensation, LVEF less than 20%  . 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 CKD (chronic kidney disease), stage IV ([place])    Stage  3-4  . Depression   . Diverticulosis   . Hepatitis B   . History of cocaine abuse ([place])   . Hypertension   . Microcytic anemia   . PAF (paroxysmal atrial fibrillation) ([place])    a. Episode in 2008 with spontaneous conversion  . S/P implantation of automatic cardioverter/defibrillator (AICD)    a. Medtronic, implanted 2012.  . Sleep apnea   . Stroke [facility]) 2004  . Thyroid disease     ROS:   All systems reviewed and negative except as noted in the HPI.   Past Surgical History:  Procedure Laterality Date  . CARDIOVERSION N/A 01/07/2014   Procedure: CARDIOVERSION;  Surgeon: Lelon Perla, MD;  Location: [facility] ENDOSCOPY;  Service: Cardiovascular;  Laterality: N/A;  . CARDIOVERSION N/A 02/11/2014   Procedure: CARDIOVERSION;  Surgeon: Pixie Casino, MD;  Location: [place];  Service: Cardiovascular;  Laterality: N/A;  . COLONOSCOPY WITH PROPOFOL N/A 02/06/2018   Procedure: COLONOSCOPY WITH PROPOFOL;  Surgeon: Yetta Flock, MD;  Location: [facility] ENDOSCOPY;  Service: Gastroenterology;  Laterality: N/A;  . LASEK eye surgery Bilateral 05/2018  . LASIK Bilateral   . PACEMAKER INSERTION    . POLYPECTOMY  02/06/2018   Procedure: POLYPECTOMY;  Surgeon: Yetta Flock, MD;  Location: [facility] ENDOSCOPY;  Service: Gastroenterology;;  . TEE WITHOUT CARDIOVERSION N/A 01/07/2014   Procedure: TRANSESOPHAGEAL ECHOCARDIOGRAM (TEE);  Surgeon: Lelon Perla, MD;  Location: [facility] ENDOSCOPY;  Service: Cardiovascular;  Laterality: N/A;  . TEE WITHOUT CARDIOVERSION N/A 02/11/2014   Procedure: TRANSESOPHAGEAL ECHOCARDIOGRAM (TEE);  Surgeon: Pixie Casino, MD;  Location: [facility] ENDOSCOPY;  Service: Cardiovascular;  Laterality: N/A;  . TONSILLECTOMY    . TRANSTHORACIC ECHOCARDIOGRAM  10/2006, 12/2009     Family History  Problem Relation Age of Onset  . Breast cancer Mother   . Multiple myeloma Mother   . Prostate cancer Father   . Diabetes Father   . Peripheral vascular disease Father   . Cerebral palsy Daughter   . Lung disease Neg Hx   . Colon cancer Neg Hx   . Stomach cancer Neg Hx   . Esophageal cancer Neg Hx   . Colon polyps Neg Hx      Social History   Socioeconomic History  . Marital status: Divorced    Spouse name: Not on file  . Number of children: 2  . Years of education: Not on file  . Highest education level: Not on file  Occupational History  . Occupation: Merchandiser, retail: UNEMPLOYED    Comment: Writer in the past  Tobacco Use  . Smoking status: Former Smoker    Packs/day: 0.50    Years: 0.50    Pack years: 0.25    Types: Cigarettes    Quit date: 05/02/2010    Years since quitting: 9.4  . Smokeless tobacco: Never Used  . Tobacco comment: Significant Second-hand and only 3 months himself  Vaping Use  . Vaping Use:  Never used  Substance and Sexual Activity  . Alcohol use: Not Currently    Alcohol/week: 0.0 standard drinks    Comment: rare  . Drug use: No    Comment: Reported history of cocaine abuse off and on   . Sexual activity: Not on file  Other Topics Concern  . Not on file  Social History Narrative   Living with a son who is a 15 year old.  He is not     working, unemployed for 3 years.  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  Denied use of alcohol.  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       [facility]:   Originally from [place], [place]. He grew up in [place]. He moved to [place] in [place]. 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 No pets currently. No bird exposure. He did have mold in a previous home. No hot tub exposure.         Social Determinants of Health   Financial Resource Strain:   . Difficulty of Paying Living Expenses:   Food Insecurity:   . Worried About Charity fundraiser in the Last Year:   . Arboriculturist in the Last Year:   Transportation Needs:   . Film/video editor (Medical):   Marland Kitchen Lack of Transportation (Non-Medical):   Physical Activity:   . Days of Exercise per Week:   . Minutes of Exercise per Session:   Stress:   . Feeling of Stress :   Social Connections:   . Frequency of Communication with Friends and Family:   . Frequency of Social Gatherings with Friends and Family:   . Attends Religious Services:   . Active Member of Clubs or Organizations:   . Attends Archivist Meetings:   Marland Kitchen Marital Status:   Intimate Partner Violence:   . Fear of Current or Ex-Partner:   . Emotionally Abused:   Marland Kitchen Physically Abused:   . Sexually Abused:      BP 102/62   Pulse 63   Ht '6\' 3"'$  (1.905 m)   Wt 280 lb (127 kg)   SpO2 95%   BMI 35.00 kg/m   Physical Exam:  Well appearing NAD HEENT: Unremarkable Neck:  No JVD, no thyromegally Lymphatics:  No  adenopathy Back:  No CVA tenderness Lungs:  Clear with no wheezes HEART:  Regular rate rhythm, no murmurs, no rubs, no clicks Abd:  soft, positive bowel sounds, no organomegally, no rebound, no guarding Ext:  2 plus pulses, no edema, no cyanosis, no clubbing Skin:  No rashes no nodules Neuro:  CN II through XII intact, motor grossly intact  EKG - NSR with prior MI  DEVICE  Normal device function.  See PaceArt for details. Approaching ERI.  Assess/Plan: 1. Chronic systolic heart failure -his symptoms are class 2. I asked him to reduce his salt intake and continue his current meds. 2. Persistent atrial fib - he is maintaining NSR. He will reduce his dose of amiodarone to 200 mg daily, Mon-Sat and none on Sunday. 3. ICD - he is approacing ERI. We will schedule ICD gen change out in 3 months. 4. ICM - he is s/p MI. He is sedentary. He denies anginal symptoms.   Mikle Bosworth.D.

## 2019-11-06 LAB — CUP PACEART INCLINIC DEVICE CHECK
Battery Voltage: 2.61 V
Brady Statistic RV Percent Paced: 0.21 %
Date Time Interrogation Session: 20210615173720
HighPow Impedance: 304 Ohm
HighPow Impedance: 50 Ohm
HighPow Impedance: 66 Ohm
Implantable Lead Implant Date: 20120806
Implantable Lead Location: 753860
Implantable Lead Model: 185
Implantable Lead Serial Number: 345870
Implantable Pulse Generator Implant Date: 20120806
Lead Channel Impedance Value: 304 Ohm
Lead Channel Pacing Threshold Amplitude: 1.875 V
Lead Channel Pacing Threshold Pulse Width: 0.4 ms
Lead Channel Sensing Intrinsic Amplitude: 6.5 mV
Lead Channel Sensing Intrinsic Amplitude: 8.75 mV
Lead Channel Setting Pacing Amplitude: 3.75 V
Lead Channel Setting Pacing Pulse Width: 0.4 ms
Lead Channel Setting Sensing Sensitivity: 0.3 mV

## 2019-11-10 NOTE — Progress Notes (Signed)
[facility] - Clinic Note  11/15/2019     CHIEF COMPLAINT Patient presents for Retina Follow Up   HISTORY OF PRESENT ILLNESS: Cody Ortega is a 59 y.o. male who presents to the clinic today for:   HPI    Retina Follow Up    Patient presents with  PVD.  In left eye.  Severity is moderate.  Duration of 6 weeks.  Since onset it is stable.  I, the attending physician,  performed the HPI with the patient and updated documentation appropriately.          Comments    Pt states vision is a little hazy, he denies new floaters and states he is no longer seeing flashes of light       Last edited by Rennis Chris, MD on 11/17/2019  1:20 AM. (History)    Flashes/floaters OS resolved.  VA OS good.  VA OD still blurred (chronic).  Referring physician: Conley Rolls My [place], [place] [address] [place],  [place] [address]  HISTORICAL INFORMATION:   Selected notes from the MEDICAL RECORD NUMBER Referred by Dr. Aletta Edouard for decreased vision and floaters x3 days    CURRENT MEDICATIONS: Current Outpatient Medications (Ophthalmic Drugs)  Medication Sig  . cycloSPORINE (RESTASIS) 0.05 % ophthalmic emulsion Place 1 drop into both eyes 2 (two) times daily.   No current facility-administered medications for this visit. (Ophthalmic Drugs)   Current Outpatient Medications (Other)  Medication Sig  . albuterol (VENTOLIN HFA) 108 (90 Base) MCG/ACT inhaler INHALE 2 PUFFS INTO THE LUNGS EVERY 6 HOURS AS NEEDED FOR WHEEZING OR SHORTNESS OF BREATH  . alprazolam (XANAX) 2 MG tablet Take 2 mg by mouth every 6 (six) hours as needed for anxiety.   Marland Kitchen amiodarone (PACERONE) 200 MG tablet Take one tablet by mouth daily Monday through Saturday.  Do NOT take on Sunday.  Marland Kitchen amLODipine (NORVASC) 10 MG tablet TAKE 1 TABLET BY MOUTH EVERY DAY  . budesonide-formoterol (SYMBICORT) 160-4.5 MCG/ACT inhaler Inhale 2 puffs into the lungs 2 (two) times daily.  . carvedilol (COREG) 12.5 MG tablet TAKE 1  TABLET(12.5 MG) BY MOUTH TWICE DAILY WITH A MEAL  . DULoxetine (CYMBALTA) 60 MG capsule Take 60 mg by mouth daily.  Marland Kitchen entecavir (BARACLUDE) 0.5 MG tablet Take 1 tablet (0.5 mg total) by mouth every other day. Please note: Every OTHER day. Please call in July and schedule a 6 month follow up in August. Thank you  . ezetimibe (ZETIA) 10 MG tablet TAKE 1 TABLET(10 MG) BY MOUTH DAILY  . ferrous sulfate 325 (65 FE) MG tablet Take 325 mg by mouth daily with breakfast.  . fluticasone (FLONASE) 50 MCG/ACT nasal spray Place 1 spray into both nostrils daily.  . hydrALAZINE (APRESOLINE) 50 MG tablet Take 1 tablet (50 mg total) by mouth 2 (two) times daily.  . ondansetron (ZOFRAN) 4 MG tablet Take 4 mg by mouth 2 (two) times daily as needed for nausea or vomiting.   . sildenafil (REVATIO) 20 MG tablet TAKE TWO TO FIVE TABLETS BY MOUTH DAILY AS NEEDED FOR ERECTILE DYSFUNCTION  . tamsulosin (FLOMAX) 0.4 MG CAPS capsule TAKE 1 CAPSULE(0.4 MG) BY MOUTH DAILY  . XARELTO 20 MG TABS tablet TAKE 1 TABLET(20 MG) BY MOUTH DAILY WITH SUPPER   No current facility-administered medications for this visit. (Other)      REVIEW OF SYSTEMS: ROS    Positive for: Cardiovascular, Eyes, Respiratory, Heme/Lymph   Negative for: Constitutional, Gastrointestinal, Neurological, Skin,  Genitourinary, Musculoskeletal, HENT, Endocrine, Psychiatric, Allergic/Imm   Last edited by Debbrah Alar, COT on 11/15/2019  9:42 AM. (History)       ALLERGIES Allergies  Allergen Reactions  . Codeine Itching, Nausea And Vomiting and Hives  . Hydrocodone-Acetaminophen Itching and Nausea And Vomiting    PAST MEDICAL HISTORY Past Medical History:  Diagnosis Date  . Anxiety   . Arthritis   . Benzodiazepine dependence ([place])   . Bronchial asthma   . CAD (coronary artery disease)    a. Cath 03/2010: mod RCA stenosis, severe diag stenosis, treated medically given lack of angina.  . 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 Chronic  systolic congestive heart failure (HCC)    Acute decompensation, LVEF less than 20%  . 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 CKD (chronic kidney disease), stage IV (HCC)    Stage 3-4  . Depression   . Diverticulosis   . Hepatitis B   . History of cocaine abuse ([place])   . Hypertension   . Microcytic anemia   . PAF (paroxysmal atrial fibrillation) ([place])    a. Episode in 2008 with spontaneous conversion  . S/P implantation of automatic cardioverter/defibrillator (AICD)    a. Medtronic, implanted 2012.  . Sleep apnea   . Stroke [facility]) 2004  . Thyroid disease    Past Surgical History:  Procedure Laterality Date  . CARDIOVERSION N/A 01/07/2014   Procedure: CARDIOVERSION;  Surgeon: Lelon Perla, MD;  Location: [facility] ENDOSCOPY;  Service: Cardiovascular;  Laterality: N/A;  . CARDIOVERSION N/A 02/11/2014   Procedure: CARDIOVERSION;  Surgeon: Pixie Casino, MD;  Location: [place];  Service: Cardiovascular;  Laterality: N/A;  . COLONOSCOPY WITH PROPOFOL N/A 02/06/2018   Procedure: COLONOSCOPY WITH PROPOFOL;  Surgeon: Yetta Flock, MD;  [facility] ENDOSCOPY;  Service: Gastroenterology;  Laterality: N/A;  . LASEK eye surgery Bilateral 05/2018  . LASIK Bilateral   . PACEMAKER INSERTION    . POLYPECTOMY  02/06/2018   Procedure: POLYPECTOMY;  Surgeon: Yetta Flock, MD;  Location[facility] ENDOSCOPY;  Service: Gastroenterology;;  . TEE WITHOUT CARDIOVERSION N/A 01/07/2014   Procedure: TRANSESOPHAGEAL ECHOCARDIOGRAM (TEE);  Surgeon: Lelon Perla, MD;  Location: [facility] ENDOSCOPY;  Service: Cardiovascular;  Laterality: N/A;  . TEE WITHOUT CARDIOVERSION N/A 02/11/2014   Procedure: TRANSESOPHAGEAL ECHOCARDIOGRAM (TEE);  Surgeon: Pixie Casino, MD;  Location: [facility] ENDOSCOPY;  Service: Cardiovascular;  Laterality: N/A;  . TONSILLECTOMY    . TRANSTHORACIC ECHOCARDIOGRAM  10/2006, 12/2009    FAMILY HISTORY Family History  Problem Relation Age of Onset  . Breast cancer Mother    . Multiple myeloma Mother   . Prostate cancer Father   . Diabetes Father   . Peripheral vascular disease Father   . Cerebral palsy Daughter   . Lung disease Neg Hx   . Colon cancer Neg Hx   . Stomach cancer Neg Hx   . Esophageal cancer Neg Hx   . Colon polyps Neg Hx     SOCIAL HISTORY Social History   Tobacco Use  . Smoking status: Former Smoker    Packs/day: 0.50    Years: 0.50    Pack years: 0.25    Types: Cigarettes    Quit date: 05/02/2010    Years since quitting: 9.5  . Smokeless tobacco: Never Used  . Tobacco comment: Significant Second-hand and only 3 months himself  Vaping Use  . Vaping Use: Never used  Substance Use Topics  . Alcohol  use: Not Currently    Alcohol/week: 0.0 standard drinks    Comment: rare  . Drug use: No    Comment: Reported history of cocaine abuse off and on          OPHTHALMIC EXAM:  Base Eye Exam    Visual Acuity (Snellen - Linear)      Right Left   Dist [place] 20/60 +2 20/20 -2   Dist ph [place] 20/30 -2 NI       Tonometry (Tonopen, 9:49 AM)      Right Left   Pressure 13 15       Pupils      Dark Light Shape React APD   Right 3 2 Round Brisk None   Left 3 2 Round Brisk None       Visual Fields (Counting fingers)      Left Right    Full Full       Extraocular Movement      Right Left    Full, Ortho Full, Ortho       Neuro/Psych    Oriented x3: Yes   Mood/Affect: Normal       Dilation    Both eyes: 1.0% Mydriacyl, 2.5% Phenylephrine @ 9:49 AM        Slit Lamp and Fundus Exam    Slit Lamp Exam      Right Left   Lids/Lashes Dermatochalasis - upper lid, mild Meibomian gland dysfunction Dermatochalasis - upper lid, mild Meibomian gland dysfunction   Conjunctiva/Sclera White and quiet White and quiet   Cornea Mild arcus, 2+inferior punctate epithelial erosions, well healed temporal cataract wounds Debris in tear film, well healed temporal cataract wounds, mild Arcus, trace Punctate epithelial erosions   Anterior  Chamber Deep and quiet Deep and quiet   Iris Round and dilated Round and dilated   Lens PC IOL in good position PC IOL in good position   Vitreous Vitreous syneresis Vitreous syneresis, PVD; heme resolved       Fundus Exam      Right Left   Disc Inferior Pallor, vascular loops Mild Pallor, Pink and Sharp   C/D Ratio 0.3 0.4   Macula Flat, Blunted foveal reflex, mild Retinal pigment epithelial mottling Flat, good foveal reflex, focal RPE disruption IN fovea, no heme or edema   Vessels Severe Vascular attenuation, superior hemisphere with telangectasias and perivascular exudation / sheathing--stable Vascular attenuation, mild Tortuousity   Periphery Attached, no heme Attached, inferior lattice, peripheral cystoid degeneration, No RT/RD on 360 scleral depression           IMAGING AND PROCEDURES  Imaging and Procedures for '@TODAY'$ @  OCT, Retina - OU - Both Eyes       Right Eye Quality was good. Central Foveal Thickness: 185. Progression has been stable. 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   Left Eye Quality was good. Central Foveal Thickness: 234. Progression has been stable. Findings include normal foveal contour, no SRF, no IRF, retinal drusen  (Focal ellipsoid disruption inferonasal fovea, interval improvement in vitreous opacities - essentially resolved).   Notes *Images captured and stored on drive  Diagnosis / Impression:  OD: Inner retinal atrophy, diffuse inner macular atrophy inferior greater than superior--remote BRAO OS: Focal ellipsoid disruption inferonasal fovea, interval improvement in vitreous opacities - essentially resolved; drusen  Clinical management:  See below  Abbreviations: NFP - Normal foveal profile. CME - cystoid macular edema.  PED - pigment epithelial detachment. IRF - intraretinal fluid. SRF - subretinal fluid. EZ - ellipsoid zone.  ERM - epiretinal membrane. ORA - outer retinal atrophy. ORT - outer retinal tubulation. SRHM - subretinal hyper-reflective material                 ASSESSMENT/PLAN:    ICD-10-CM   1. Posterior vitreous detachment of left eye  H43.812   2. Vitreous hemorrhage of left eye (HCC)  H43.12   3. Retinal artery branch occlusion of right eye  H34.231   4. Retinal edema  H35.81 OCT, Retina - OU - Both Eyes  5. Essential hypertension  I10   6. Hypertensive retinopathy of both eyes  H35.033   7. Pseudophakia of both eyes  Z96.1     1,2. Hemorrhagic PVD OS  - Onset late March 2021 -- presented to Dr. Jerilynn Mages. Le per pt history  - Symptoms resolved, VH cleared  - Discussed findings and prognosis  - No RT or RD on 360 scleral depressed exam  - Reviewed s/s of RT/RD  - Strict return precautions for any such RT/RD signs/symptoms  - pt is cleared from a retina standpoint for release to Dr. Marin Comment and resumption of primary eye care  3,4. 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 Hypertensive retinopathy OU  - discussed importance of tight BP control  - monitor  7. Pseudophakia OU  - s/p CE/IOL OU  - beautiful surgery, doing well  - monitor   Ophthalmic Meds Ordered this visit:  No orders of the defined types were placed in this encounter.      Return if symptoms worsen or fail to improve.  There are no Patient Instructions on file for this visit.   Explained the diagnoses, plan, and follow up with the patient and they expressed understanding.  Patient expressed understanding of the importance of proper follow up care.   This document serves as a record of services personally performed by Gardiner Sleeper, MD, PhD. It was created on their behalf by San Jetty. Owens Shark, COT, an ophthalmic technician. The creation of this record is the provider's dictation and/or activities during the visit.    Electronically  signed by: San Jetty. Owens Shark, COT 07.11.2021 1:24 AM   This document serves as a record of services personally performed by Gardiner Sleeper, MD, PhD. It was created on their behalf by Estill Bakes, COT an ophthalmic technician. The creation of this record is the provider's dictation and/or activities during the visit.    Electronically signed by: Estill Bakes, COT 11/15/19 @ 1:24 AM  Gardiner Sleeper, M.D., Ph.D. Diseases & Surgery of the Retina and [place] 11/15/19  I have reviewed the above documentation for accuracy and completeness, and I agree with the above. Gardiner Sleeper, M.D., Ph.D. 11/17/19 1:24 AM   Abbreviations: M myopia (nearsighted); A astigmatism; H hyperopia (farsighted); P presbyopia; Mrx spectacle prescription;  CTL contact lenses; OD right eye; OS left eye; OU both eyes  XT exotropia; ET esotropia; PEK punctate epithelial keratitis; PEE punctate epithelial erosions; DES dry eye syndrome; MGD meibomian gland dysfunction; ATs artificial tears; PFAT's preservative free artificial tears; [place] nuclear sclerotic cataract; PSC posterior subcapsular cataract; ERM epi-retinal membrane; PVD posterior vitreous detachment; RD retinal detachment; DM diabetes mellitus; DR diabetic retinopathy; NPDR non-proliferative diabetic retinopathy; PDR proliferative diabetic retinopathy; CSME clinically  significant macular edema; DME diabetic macular edema; dbh dot blot hemorrhages; CWS cotton wool spot; POAG primary open angle glaucoma; C/D cup-to-disc ratio; HVF humphrey visual field; GVF goldmann visual field; OCT optical coherence tomography; IOP intraocular pressure; BRVO Branch retinal vein occlusion; CRVO central retinal vein occlusion; CRAO central retinal artery occlusion; BRAO branch retinal artery occlusion; RT retinal tear; SB scleral buckle; PPV pars plana vitrectomy; VH Vitreous hemorrhage; PRP panretinal laser photocoagulation; IVK intravitreal kenalog; VMT  vitreomacular traction; MH Macular hole;  NVD neovascularization of the disc; NVE neovascularization elsewhere; AREDS age related eye disease study; ARMD age related macular degeneration; POAG primary open angle glaucoma; EBMD epithelial/anterior basement membrane dystrophy; ACIOL anterior chamber intraocular lens; IOL intraocular lens; PCIOL posterior chamber intraocular lens; Phaco/IOL phacoemulsification with intraocular lens placement; [place] photorefractive keratectomy; LASIK laser assisted in situ keratomileusis; HTN hypertension; DM diabetes mellitus; COPD chronic obstructive pulmonary disease

## 2019-11-15 ENCOUNTER — Encounter (INDEPENDENT_AMBULATORY_CARE_PROVIDER_SITE_OTHER): Payer: Self-pay | Admitting: Ophthalmology

## 2019-11-15 ENCOUNTER — Other Ambulatory Visit: Payer: Self-pay

## 2019-11-15 ENCOUNTER — Ambulatory Visit (INDEPENDENT_AMBULATORY_CARE_PROVIDER_SITE_OTHER): Payer: Medicaid Other | Admitting: Ophthalmology

## 2019-11-15 DIAGNOSIS — H35033 Hypertensive retinopathy, bilateral: Secondary | ICD-10-CM | POA: Diagnosis not present

## 2019-11-15 DIAGNOSIS — H3581 Retinal edema: Secondary | ICD-10-CM | POA: Diagnosis not present

## 2019-11-15 DIAGNOSIS — H34231 Retinal artery branch occlusion, right eye: Secondary | ICD-10-CM | POA: Diagnosis not present

## 2019-11-15 DIAGNOSIS — Z961 Presence of intraocular lens: Secondary | ICD-10-CM

## 2019-11-15 DIAGNOSIS — I10 Essential (primary) hypertension: Secondary | ICD-10-CM | POA: Diagnosis not present

## 2019-11-15 DIAGNOSIS — H4312 Vitreous hemorrhage, left eye: Secondary | ICD-10-CM

## 2019-11-15 DIAGNOSIS — H43812 Vitreous degeneration, left eye: Secondary | ICD-10-CM

## 2019-11-17 ENCOUNTER — Encounter (INDEPENDENT_AMBULATORY_CARE_PROVIDER_SITE_OTHER): Payer: Self-pay | Admitting: Ophthalmology

## 2019-11-18 ENCOUNTER — Ambulatory Visit: Payer: Medicaid Other

## 2019-11-20 ENCOUNTER — Telehealth: Payer: Self-pay | Admitting: Cardiovascular Disease

## 2019-11-20 NOTE — Telephone Encounter (Signed)
Patient is scheduled for the [facility] and [employer]. He wants to know if Dr. Burt Knack would approve of this or if another shot would be better for him. Please advise.

## 2019-11-20 NOTE — Telephone Encounter (Signed)
Left message for patient that we are not recommending any vaccine over another and to keep appointment as scheduled. Instructed him to call tomorrow with questions or concerns.

## 2019-11-20 NOTE — Telephone Encounter (Signed)
Patient called again about message below.

## 2019-11-21 ENCOUNTER — Ambulatory Visit: Payer: Medicaid Other | Attending: Internal Medicine

## 2019-11-21 ENCOUNTER — Ambulatory Visit: Payer: Medicaid Other

## 2019-11-21 DIAGNOSIS — Z23 Encounter for immunization: Secondary | ICD-10-CM

## 2019-11-21 NOTE — Progress Notes (Signed)
   Covid-19 Vaccination Clinic  Name:  SHADD DUNSTAN    MRN: [id number] DOB: [birth year]  11/21/2019  Mr. Maybury was observed post Covid-19 immunization for 15 minutes without incident. He was provided with Vaccine Information Sheet and instruction to access the V-Safe system.   Mr. Reiland was instructed to call 911 with any severe reactions post vaccine: Marland Kitchen Difficulty breathing  . Swelling of face and throat  . A fast heartbeat  . A bad rash all over body  . Dizziness and weakness   Immunizations Administered    Name Date Dose VIS Date Route   JANSSEN COVID-19 VACCINE 11/21/2019  3:46 PM 0.5 mL 06/29/2019 Intramuscular   Manufacturer: Alphonsa Overall   Lot: [id number]   [place]: [id number]

## 2019-11-29 ENCOUNTER — Telehealth: Payer: Self-pay

## 2019-11-29 DIAGNOSIS — K7460 Unspecified cirrhosis of liver: Secondary | ICD-10-CM

## 2019-11-29 DIAGNOSIS — N189 Chronic kidney disease, unspecified: Secondary | ICD-10-CM

## 2019-11-29 DIAGNOSIS — B181 Chronic viral hepatitis B without delta-agent: Secondary | ICD-10-CM

## 2019-11-29 NOTE — Telephone Encounter (Signed)
-----   Message from Roetta Sessions, [place] sent at 06/27/2019 10:13 AM EST ----- Regarding: due for labs in August Due for labs in 12-2019:  CMET, Hep B DNA ultraquant, Hep B E antigen, AFP, INR  Agreed to go to OUR lab

## 2019-11-29 NOTE — Telephone Encounter (Signed)
Called and LM for pt that he is due for labs next week.  I put the orders in for our lab and informed him of their hours.

## 2019-12-11 ENCOUNTER — Ambulatory Visit: Payer: Medicaid Other | Admitting: Cardiovascular Disease

## 2019-12-11 ENCOUNTER — Encounter: Payer: Self-pay | Admitting: Cardiovascular Disease

## 2019-12-11 ENCOUNTER — Other Ambulatory Visit (INDEPENDENT_AMBULATORY_CARE_PROVIDER_SITE_OTHER): Payer: Medicaid Other

## 2019-12-11 ENCOUNTER — Other Ambulatory Visit: Payer: Self-pay

## 2019-12-11 VITALS — BP 154/88 | HR 60 | Ht 75.0 in | Wt 287.0 lb

## 2019-12-11 DIAGNOSIS — B181 Chronic viral hepatitis B without delta-agent: Secondary | ICD-10-CM

## 2019-12-11 DIAGNOSIS — N189 Chronic kidney disease, unspecified: Secondary | ICD-10-CM

## 2019-12-11 DIAGNOSIS — K7460 Unspecified cirrhosis of liver: Secondary | ICD-10-CM

## 2019-12-11 DIAGNOSIS — N522 Drug-induced erectile dysfunction: Secondary | ICD-10-CM

## 2019-12-11 DIAGNOSIS — I10 Essential (primary) hypertension: Secondary | ICD-10-CM

## 2019-12-11 DIAGNOSIS — I480 Paroxysmal atrial fibrillation: Secondary | ICD-10-CM

## 2019-12-11 DIAGNOSIS — I2510 Atherosclerotic heart disease of native coronary artery without angina pectoris: Secondary | ICD-10-CM

## 2019-12-11 DIAGNOSIS — Z95810 Presence of automatic (implantable) cardiac defibrillator: Secondary | ICD-10-CM

## 2019-12-11 DIAGNOSIS — I5022 Chronic systolic (congestive) heart failure: Secondary | ICD-10-CM | POA: Diagnosis not present

## 2019-12-11 LAB — COMPREHENSIVE METABOLIC PANEL
ALT: 11 U/L (ref 0–53)
AST: 12 U/L (ref 0–37)
Albumin: 4.2 g/dL (ref 3.5–5.2)
Alkaline Phosphatase: 70 U/L (ref 39–117)
BUN: 27 mg/dL — ABNORMAL HIGH (ref 6–23)
CO2: 24 mEq/L (ref 19–32)
Calcium: 9.7 mg/dL (ref 8.4–10.5)
Chloride: 101 mEq/L (ref 96–112)
Creatinine, Ser: 2.21 mg/dL — ABNORMAL HIGH (ref 0.40–1.50)
GFR: 30.82 mL/min — ABNORMAL LOW (ref >60.00)
Glucose, Bld: 105 mg/dL — ABNORMAL HIGH (ref 70–99)
Potassium: 3.6 mEq/L (ref 3.5–5.1)
Sodium: 137 mEq/L (ref 135–145)
Total Bilirubin: 0.5 mg/dL (ref 0.2–1.2)
Total Protein: 7.6 g/dL (ref 6.0–8.3)

## 2019-12-11 LAB — PROTIME-INR
INR: 1.3 ratio — ABNORMAL HIGH (ref 0.8–1.0)
Prothrombin Time: 14.6 s — ABNORMAL HIGH (ref 9.6–13.1)

## 2019-12-11 MED ORDER — SILDENAFIL CITRATE 20 MG PO TABS: 20.0000 mg | ORAL_TABLET | Freq: Every day | ORAL | 3 refills | Status: DC | PRN

## 2019-12-11 MED ORDER — FUROSEMIDE 40 MG PO TABS
40.0000 mg | ORAL_TABLET | Freq: Every day | ORAL | 3 refills | Status: DC
Start: 2019-12-11 — End: 2020-05-12

## 2019-12-11 NOTE — Progress Notes (Signed)
Cardiology Office Note:    Date:  12/11/2019   ID:  [name], DOB 1960/06/21, MRN [id number]  PCP:  Ladell Pier, MD  [facility] Cardiologist:  [name], MD  [place] Electrophysiologist:  None   Referring MD: Ladell Pier, MD   Chief Complaint  Patient presents with  . Shortness of Breath    History of Present Illness:    Cody Ortega is a 59 y.o. male with a hx of chronic systolic heart failure, paroxysmal atrial fibrillation, cirrhosis with hepatitis B, and coronary artery disease, presenting for follow-up evaluation.  The patient is here alone today. He remains inactive. Says he's been more fatigued than in the past. Felt much better 2 years ago. He's been sedentary during the Covid 19 pandemic. He has heaviness in the chest at times. This can occur with physical or emotional stress. He's been missing his morning medications more often. Took his medications this morning. His BP had been running in the normal range until recently.  He denies leg swelling, orthopnea, or PND.  He does have a nocturnal cough and feels like he brings up a lot of phlegm in the mornings.  Past Medical History:  Diagnosis Date  . Anxiety   . Arthritis   . Benzodiazepine dependence ([place])   . Bronchial asthma   . CAD (coronary artery disease)    a. Cath 03/2010: mod RCA stenosis, severe diag stenosis, treated medically given lack of angina.  . 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 Chronic systolic congestive heart failure (HCC)    Acute decompensation, LVEF less than 20%  . Cirrhosis ([place])    secondary to hepatitis B  . CKD (chronic kidney disease), stage IV (HCC)    Stage 3-4  . Depression   . Diverticulosis   . Hepatitis B   . History of cocaine abuse ([place])   . Hypertension   . Microcytic anemia   . PAF (paroxysmal atrial fibrillation) ([place])    a. Episode in 2008 with spontaneous conversion  . S/P implantation of automatic  cardioverter/defibrillator (AICD)    a. Medtronic, implanted 2012.  . Sleep apnea   . Stroke [facility]) 2004  . Thyroid disease     Past Surgical History:  Procedure Laterality Date  . CARDIOVERSION N/A 01/07/2014   Procedure: CARDIOVERSION;  Surgeon: Lelon Perla, MD;  Location: [facility] ENDOSCOPY;  Service: Cardiovascular;  Laterality: N/A;  . CARDIOVERSION N/A 02/11/2014   Procedure: CARDIOVERSION;  Surgeon: Pixie Casino, MD;  Location: [place];  Service: Cardiovascular;  Laterality: N/A;  . COLONOSCOPY WITH PROPOFOL N/A 02/06/2018   Procedure: COLONOSCOPY WITH PROPOFOL;  Surgeon: Yetta Flock, MD;  Location: [facility] ENDOSCOPY;  Service: Gastroenterology;  Laterality: N/A;  . LASEK eye surgery Bilateral 05/2018  . LASIK Bilateral   . PACEMAKER INSERTION    . POLYPECTOMY  02/06/2018   Procedure: POLYPECTOMY;  Surgeon: Yetta Flock, MD;  Location: [facility] ENDOSCOPY;  Service: Gastroenterology;;  . TEE WITHOUT CARDIOVERSION N/A 01/07/2014   Procedure: TRANSESOPHAGEAL ECHOCARDIOGRAM (TEE);  Surgeon: Lelon Perla, MD;  Location: [facility] ENDOSCOPY;  Service: Cardiovascular;  Laterality: N/A;  . TEE WITHOUT CARDIOVERSION N/A 02/11/2014   Procedure: TRANSESOPHAGEAL ECHOCARDIOGRAM (TEE);  Surgeon: Pixie Casino, MD;  Location: [facility] ENDOSCOPY;  Service: Cardiovascular;  Laterality: N/A;  . TONSILLECTOMY    . TRANSTHORACIC ECHOCARDIOGRAM  10/2006, 12/2009    Current Medications: Current Meds  Medication Sig  . albuterol (VENTOLIN HFA) 108 (90 Base)  MCG/ACT inhaler INHALE 2 PUFFS INTO THE LUNGS EVERY 6 HOURS AS NEEDED FOR WHEEZING OR SHORTNESS OF BREATH  . alprazolam (XANAX) 2 MG tablet Take 2 mg by mouth every 6 (six) hours as needed for anxiety.   Marland Kitchen amiodarone (PACERONE) 200 MG tablet Take one tablet by mouth daily Monday through Saturday.  Do NOT take on Sunday.  Marland Kitchen amLODipine (NORVASC) 10 MG tablet TAKE 1 TABLET BY MOUTH EVERY DAY  . budesonide-formoterol (SYMBICORT) 160-4.5 MCG/ACT  inhaler Inhale 2 puffs into the lungs 2 (two) times daily.  . carvedilol (COREG) 12.5 MG tablet TAKE 1 TABLET(12.5 MG) BY MOUTH TWICE DAILY WITH A MEAL  . cycloSPORINE (RESTASIS) 0.05 % ophthalmic emulsion Place 1 drop into both eyes 2 (two) times daily.  . DULoxetine (CYMBALTA) 60 MG capsule Take 60 mg by mouth daily.  Marland Kitchen entecavir (BARACLUDE) 0.5 MG tablet Take 1 tablet (0.5 mg total) by mouth every other day. Please note: Every OTHER day. Please call in July and schedule a 6 month follow up in August. Thank you  . ezetimibe (ZETIA) 10 MG tablet TAKE 1 TABLET(10 MG) BY MOUTH DAILY  . ferrous sulfate 325 (65 FE) MG tablet Take 325 mg by mouth daily with breakfast.  . fluticasone (FLONASE) 50 MCG/ACT nasal spray Place 1 spray into both nostrils daily.  . hydrALAZINE (APRESOLINE) 50 MG tablet Take 1 tablet (50 mg total) by mouth 2 (two) times daily.  . ondansetron (ZOFRAN) 4 MG tablet Take 4 mg by mouth 2 (two) times daily as needed for nausea or vomiting.   . sildenafil (REVATIO) 20 MG tablet Take 1 tablet (20 mg total) by mouth daily as needed (Take 2-5 tablets once daily as needed prior to sexual activity.).  Marland Kitchen tamsulosin (FLOMAX) 0.4 MG CAPS capsule TAKE 1 CAPSULE(0.4 MG) BY MOUTH DAILY  . XARELTO 20 MG TABS tablet TAKE 1 TABLET(20 MG) BY MOUTH DAILY WITH SUPPER  . [DISCONTINUED] sildenafil (REVATIO) 20 MG tablet TAKE TWO TO FIVE TABLETS BY MOUTH DAILY AS NEEDED FOR ERECTILE DYSFUNCTION     Allergies:   Codeine and Hydrocodone-acetaminophen   Social History   Socioeconomic History  . Marital status: Divorced    Spouse name: Not on file  . Number of children: 2  . Years of education: Not on file  . Highest education level: Not on file  Occupational History  . Occupation: Merchandiser, retail: UNEMPLOYED    Comment: Writer in the past  Tobacco Use  . Smoking status: Former Smoker    Packs/day: 0.50    Years: 0.50    Pack years: 0.25    Types: Cigarettes    Quit date:  05/02/2010    Years since quitting: 9.6  . Smokeless tobacco: Never Used  . Tobacco comment: Significant Second-hand and only 3 months himself  Vaping Use  . Vaping Use: Never used  Substance and Sexual Activity  . Alcohol use: Not Currently    Alcohol/week: 0.0 standard drinks    Comment: rare  . Drug use: No    Comment: Reported history of cocaine abuse off and on   . Sexual activity: Not on file  Other Topics Concern  . Not on file  Social History Narrative   Living with a son who is a 79 year old.  He is not     working, unemployed for 3 years.  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  Denied use of alcohol.  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       [place] [facility]:   Originally from [place], [place]. He grew up in [place]. He moved to [place] in 1985. 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 No pets currently. No bird exposure. He did have mold in a previous home. No hot tub exposure.         Social Determinants of Health   Financial Resource Strain:   . Difficulty of Paying Living Expenses:   Food Insecurity:   . Worried About Charity fundraiser in the Last Year:   . Arboriculturist in the Last Year:   Transportation Needs:   . Film/video editor (Medical):   Marland Kitchen Lack of Transportation (Non-Medical):   Physical Activity:   . Days of Exercise per Week:   . Minutes of Exercise per Session:   Stress:   . Feeling of Stress :   Social Connections:   . Frequency of Communication with Friends and Family:   . Frequency of Social Gatherings with Friends and Family:   . Attends Religious Services:   . Active Member of Clubs or Organizations:   . Attends Archivist Meetings:   Marland Kitchen Marital Status:      Family History: The patient's family history includes Breast cancer in his mother; Cerebral palsy in his daughter; Diabetes in his father; Multiple myeloma in his mother; Peripheral  vascular disease in his father; Prostate cancer in his father. There is no history of Lung disease, Colon cancer, Stomach cancer, Esophageal cancer, or Colon polyps.  ROS:   Please see the history of present illness.    All other systems reviewed and are negative.  EKGs/Labs/Other Studies Reviewed:    The following studies were reviewed today: Echo 04-11-2019: 1. Left ventricular ejection fraction, by visual estimation, is 30 to  35%. The left ventricle has severely decreased function. Apex not  well-visualized. There is moderately increased left ventricular  hypertrophy.  2. The left ventricle demonstrates global hypokinesis.  3. The average left ventricular global longitudinal strain is -9.1 %.  4. Moderate to severely dilated left ventricular internal cavity size.  5. Left ventricular diastolic parameters are consistent with Grade II  diastolic dysfunction (pseudonormalization).  6. Elevated left atrial pressure.  7. Global right ventricle has normal systolic function.The right  ventricular size is normal. No increase in right ventricular wall  thickness.  8. Left atrial size was severely dilated.  9. Right atrial size was normal.  10. Trivial pericardial effusion is present.  11. The mitral valve is normal in structure. No evidence of mitral valve  regurgitation.  12. The tricuspid valve is normal in structure. Tricuspid valve  regurgitation is not demonstrated.  13. The aortic valve is tricuspid. Aortic valve regurgitation is not  visualized. No evidence of aortic valve sclerosis or stenosis.  14. The pulmonic valve was not well visualized. Pulmonic valve  regurgitation is not visualized.   EKG:  EKG is not ordered today.    Recent Labs: 04/15/2019: ALT 10 06/19/2019: BUN 28; Creatinine, Ser 1.91; Potassium 3.9; Sodium 139  Recent Lipid Panel    Component Value Date/Time   CHOL 180 07/09/2018 1527   TRIG 118 07/09/2018 1527   HDL 40 07/09/2018 1527   CHOLHDL  4.5 07/09/2018 1527   CHOLHDL 3.9 10/12/2015 1026   VLDL 27 10/12/2015 1026   LDLCALC 116 (H) 07/09/2018 1527    Physical  Exam:    VS:  BP (!) 154/88   Pulse 60   Ht 6' 3" (1.905 m)   Wt 287 lb (130.2 kg)   SpO2 95%   BMI 35.87 kg/m     Wt Readings from Last 3 Encounters:  12/11/19 287 lb (130.2 kg)  10/15/19 280 lb (127 kg)  09/05/19 282 lb 9.6 oz (128.2 kg)     GEN: Well nourished, well developed overweight male in no acute distress HEENT: Normal NECK: No JVD; No carotid bruits LYMPHATICS: No lymphadenopathy CARDIAC: RRR, no murmurs, rubs, gallops RESPIRATORY:  Clear to auscultation without rales, wheezing or rhonchi  ABDOMEN: Soft, non-tender, non-distended MUSCULOSKELETAL:  No edema; No deformity  SKIN: Warm and dry NEUROLOGIC:  Alert and oriented x 3 PSYCHIATRIC:  Normal affect   ASSESSMENT:    1. Chronic systolic heart failure ([place])   2. Automatic implantable cardioverter-defibrillator in situ   3. Coronary artery disease involving native coronary artery of native heart without angina pectoris   4. PAF (paroxysmal atrial fibrillation) ([place])   5. Essential hypertension, malignant   6. Drug-induced erectile dysfunction    PLAN:    In order of problems listed above:  1. Patient with New York Heart Association functional class IIb symptoms.  Recommend add furosemide 40 mg daily.  Continue amlodipine, carvedilol, and hydralazine at current doses.  Importance of medication compliance reviewed with the patient. 2. Generator change scheduled in the near future.  Recent visit with Dr. Lovena Le reviewed. 3. Continue medical therapy.  No antiplatelet therapy in this patient on rivaroxaban.  Continue beta-blockade. 4. Maintaining sinus rhythm on amiodarone.  Anticoagulated with rivaroxaban.  Did not tolerate atrial for well at all and will be important to continue with a rhythm control strategy in the setting of his heart failure and other medical problems. 5. Add  furosemide.  Work on medication compliance.  Strategies discussed today. 6. Sildenafil Rx written   Medication Adjustments/Labs and Tests Ordered: Current medicines are reviewed at length with the patient today.  Concerns regarding medicines are outlined above.  Orders Placed This Encounter  Procedures  . Basic metabolic panel   Meds ordered this encounter  Medications  . furosemide (LASIX) 40 MG tablet    Sig: Take 1 tablet (40 mg total) by mouth daily.    Dispense:  90 tablet    Refill:  3  . sildenafil (REVATIO) 20 MG tablet    Sig: Take 1 tablet (20 mg total) by mouth daily as needed (Take 2-5 tablets once daily as needed prior to sexual activity.).    Dispense:  100 tablet    Refill:  3    Patient Instructions  Medication Instructions:  1) START LASIX 40 mg daily  Labs: Your provider recommends that you return for lab work in 2 weeks. You may come any time between 7:30AM and 4:30PM. You do not need to be fasting.  Follow-Up: At [facility], you and your health needs are our priority.  As part of our continuing mission to provide you with exceptional heart care, we have created designated Provider Care Teams.  These Care Teams include your primary Cardiologist (physician) and Advanced Practice Providers (APPs -  Physician Assistants and Nurse Practitioners) who all work together to provide you with the care you need, when you need it. Your next appointment:   6 month(s) The format for your next appointment:   In Person Provider:   Richardson Dopp, PA-C     Signed, [name], MD  12/11/2019 11:29 AM    [facility]

## 2019-12-11 NOTE — Patient Instructions (Signed)
Medication Instructions:  1) START LASIX 40 mg daily  Labs: Your provider recommends that you return for lab work in 2 weeks. You may come any time between 7:30AM and 4:30PM. You do not need to be fasting.  Follow-Up: At [facility], you and your health needs are our priority.  As part of our continuing mission to provide you with exceptional heart care, we have created designated Provider Care Teams.  These Care Teams include your primary Cardiologist (physician) and Advanced Practice Providers (APPs -  Physician Assistants and Nurse Practitioners) who all work together to provide you with the care you need, when you need it. Your next appointment:   6 month(s) The format for your next appointment:   In Person Provider:   Richardson Dopp, PA-C

## 2019-12-14 LAB — HEPATITIS B DNA, ULTRAQUANTITATIVE, PCR
Hepatitis B DNA (Calc): <1 Log IU/mL
Hepatitis B DNA: <10 IU/mL

## 2019-12-14 LAB — HEPATITIS B E ANTIGEN: Hep B E Ag: NONREACTIVE

## 2019-12-14 LAB — AFP TUMOR MARKER: AFP-Tumor Marker: 1.6 ng/mL (ref <6.1)

## 2019-12-16 DIAGNOSIS — F410 Panic disorder [episodic paroxysmal anxiety] without agoraphobia: Secondary | ICD-10-CM | POA: Diagnosis not present

## 2019-12-16 DIAGNOSIS — F330 Major depressive disorder, recurrent, mild: Secondary | ICD-10-CM | POA: Diagnosis not present

## 2019-12-25 ENCOUNTER — Other Ambulatory Visit: Payer: Medicaid Other

## 2019-12-30 ENCOUNTER — Other Ambulatory Visit: Payer: Self-pay

## 2019-12-30 DIAGNOSIS — B181 Chronic viral hepatitis B without delta-agent: Secondary | ICD-10-CM

## 2019-12-30 DIAGNOSIS — K7460 Unspecified cirrhosis of liver: Secondary | ICD-10-CM

## 2019-12-30 NOTE — Progress Notes (Signed)
Pt needs RUQ U/S for [place] screening.    Pt scheduled at [facility] on 9-14 at 10:30am to arrive at 10:15. NPO after midnight. Letter mailed to pt.

## 2020-01-03 ENCOUNTER — Ambulatory Visit: Payer: Medicaid Other | Attending: Internal Medicine | Admitting: Internal Medicine

## 2020-01-03 DIAGNOSIS — K649 Unspecified hemorrhoids: Secondary | ICD-10-CM

## 2020-01-03 DIAGNOSIS — I5022 Chronic systolic (congestive) heart failure: Secondary | ICD-10-CM

## 2020-01-03 DIAGNOSIS — Z2821 Immunization not carried out because of patient refusal: Secondary | ICD-10-CM

## 2020-01-03 DIAGNOSIS — I10 Essential (primary) hypertension: Secondary | ICD-10-CM

## 2020-01-03 DIAGNOSIS — N1832 Chronic kidney disease, stage 3b: Secondary | ICD-10-CM

## 2020-01-03 NOTE — Progress Notes (Signed)
Having trouble with Hemorrhoids. Requesting referral.  Rattling in his upper chest.

## 2020-01-03 NOTE — Progress Notes (Signed)
Virtual Visit via Telephone Note Due to current restrictions/limitations of in-office visits due to the COVID-19 pandemic, this scheduled clinical appointment was converted to a telehealth visit  I connected with Cody Ortega on 01/03/20 at 4:04 p.m by telephone and verified that I am speaking with the correct person using two identifiers. I am in my office.  The patient is at home.  Only the patient and myself participated in this encounter.  I discussed the limitations, risks, security and privacy concerns of performing an evaluation and management service by telephone and the availability of in person appointments. I also discussed with the patient that there may be a patient responsible charge related to this service. The patient expressed understanding and agreed to proceed.   History of Present Illness: The patient with history of COPD and asthma, HTN, CKD stage III, CAD, andNICM with chronic systolic CHF and ICD, PAF,immune active hep B,adenomatous polyps,dep/anx(followed by psychiatrist, Noemi Chapel), OSA (intolerant of CPAP mask).  Reports some increase anxiety today.   BP was elev this a.m at 179/89 which is unusual for him.  Usually SBP in the 120s.  He states that he will calm down and then recheck his blood pressure later. For his anxiety he is plugged into mental health services.  CAD/HTN/CHF:  [facility] cardiology recently.  Furosemide was added for some CHF symptoms.  His other medications including amlodipine, carvedilol and hydralazine were continued.  No bruising on Xarelto.  He tells me that his ICD has reached the end of his life and will be replaced later this month.   -Denies any increase shortness of breath.  No lower extremity edema at this time.  COPD/asthma: Reports he is doing well on Symbicort inhaler.  Request referral to surgeon again for hemorrhoids that have become bothersome.  He has had some of them removed in the past.  CKD: I reviewed his last 2  chemistries.  His GFR continues to decline.  He is not plugged in with the nephrologist.  HM:  Completed J&J COvid vaccine.  Declines flu shot Outpatient Encounter Medications as of 01/03/2020  Medication Sig  . albuterol (VENTOLIN HFA) 108 (90 Base) MCG/ACT inhaler INHALE 2 PUFFS INTO THE LUNGS EVERY 6 HOURS AS NEEDED FOR WHEEZING OR SHORTNESS OF BREATH  . alprazolam (XANAX) 2 MG tablet Take 2 mg by mouth every 6 (six) hours as needed for anxiety.   Marland Kitchen amiodarone (PACERONE) 200 MG tablet Take one tablet by mouth daily Monday through Saturday.  Do NOT take on Sunday.  Marland Kitchen amLODipine (NORVASC) 10 MG tablet TAKE 1 TABLET BY MOUTH EVERY DAY  . budesonide-formoterol (SYMBICORT) 160-4.5 MCG/ACT inhaler Inhale 2 puffs into the lungs 2 (two) times daily.  . carvedilol (COREG) 12.5 MG tablet TAKE 1 TABLET(12.5 MG) BY MOUTH TWICE DAILY WITH A MEAL  . cycloSPORINE (RESTASIS) 0.05 % ophthalmic emulsion Place 1 drop into both eyes 2 (two) times daily.  . DULoxetine (CYMBALTA) 60 MG capsule Take 60 mg by mouth daily.  Marland Kitchen entecavir (BARACLUDE) 0.5 MG tablet Take 1 tablet (0.5 mg total) by mouth every other day. Please note: Every OTHER day. Please call in July and schedule a 6 month follow up in August. Thank you  . ezetimibe (ZETIA) 10 MG tablet TAKE 1 TABLET(10 MG) BY MOUTH DAILY  . ferrous sulfate 325 (65 FE) MG tablet Take 325 mg by mouth daily with breakfast.  . fluticasone (FLONASE) 50 MCG/ACT nasal spray Place 1 spray into both nostrils daily.  . furosemide (LASIX)  40 MG tablet Take 1 tablet (40 mg total) by mouth daily.  . hydrALAZINE (APRESOLINE) 50 MG tablet Take 1 tablet (50 mg total) by mouth 2 (two) times daily.  . ondansetron (ZOFRAN) 4 MG tablet Take 4 mg by mouth 2 (two) times daily as needed for nausea or vomiting.   . sildenafil (REVATIO) 20 MG tablet Take 1 tablet (20 mg total) by mouth daily as needed (Take 2-5 tablets once daily as needed prior to sexual activity.).  Marland Kitchen tamsulosin (FLOMAX) 0.4  MG CAPS capsule TAKE 1 CAPSULE(0.4 MG) BY MOUTH DAILY  . XARELTO 20 MG TABS tablet TAKE 1 TABLET(20 MG) BY MOUTH DAILY WITH SUPPER   No facility-administered encounter medications on file as of 01/03/2020.      Observations/Objective: Wt Readings from Last 3 Encounters:  12/11/19 287 lb (130.2 kg)  10/15/19 280 lb (127 kg)  09/05/19 282 lb 9.6 oz (128.2 kg)     Chemistry      Component Value Date/Time   NA 137 12/11/2019 1149   NA 142 04/15/2019 1430   K 3.6 12/11/2019 1149   CL 101 12/11/2019 1149   CO2 24 12/11/2019 1149   BUN 27 (H) 12/11/2019 1149   BUN 25 (H) 04/15/2019 1430   CREATININE 2.21 (H) 12/11/2019 1149   CREATININE 1.38 (H) 02/08/2016 1155      Component Value Date/Time   CALCIUM 9.7 12/11/2019 1149   ALKPHOS 70 12/11/2019 1149   AST 12 12/11/2019 1149   ALT 11 12/11/2019 1149   BILITOT 0.5 12/11/2019 1149   BILITOT 0.3 04/15/2019 1430     Lab Results  Component Value Date   WBC 7.0 07/09/2018   HGB 16.4 07/09/2018   HCT 47.9 07/09/2018   MCV 93 07/09/2018   PLT 185 07/09/2018     Assessment and Plan: 1. Essential hypertension Advised patient to continue to check his blood pressure daily.  Goal is 130/80 or lower.  Please call and let me know if blood pressure runs higher than that consistently.  He will continue his current medications for now.  2. Chronic systolic heart failure (HCC) Continue current medications including furosemide that was recently started by his cardiologist.  3. Hemorrhoids, unspecified hemorrhoid type - Ambulatory referral to General Surgery  4. Stage 3b chronic kidney disease - Ambulatory referral to Nephrology  5. Influenza vaccination declined This was offered.  Patient declined   Follow Up Instructions: 4 mths and PRN   I discussed the assessment and treatment plan with the patient. The patient was provided an opportunity to ask questions and all were answered. The patient agreed with the plan and demonstrated  an understanding of the instructions.   The patient was advised to call back or seek an in-person evaluation if the symptoms worsen or if the condition fails to improve as anticipated.  I provided 11 minutes of non-face-to-face time during this encounter.   Karle Plumber, MD

## 2020-01-10 ENCOUNTER — Telehealth: Payer: Self-pay | Admitting: Internal Medicine

## 2020-01-10 NOTE — Telephone Encounter (Signed)
Patient out of town. Audible alert heard from device. Scheduled for gen change 01/17/20.

## 2020-01-10 NOTE — Telephone Encounter (Signed)
Pt called to let Dr. Lovena Le know that the device has went off twice. Wanted Dr. Lovena Le and nurse to be made aware of it

## 2020-01-13 ENCOUNTER — Other Ambulatory Visit: Payer: Medicaid Other | Admitting: *Deleted

## 2020-01-13 ENCOUNTER — Other Ambulatory Visit: Payer: Self-pay

## 2020-01-13 DIAGNOSIS — I480 Paroxysmal atrial fibrillation: Secondary | ICD-10-CM | POA: Diagnosis not present

## 2020-01-13 DIAGNOSIS — Z95810 Presence of automatic (implantable) cardiac defibrillator: Secondary | ICD-10-CM

## 2020-01-13 DIAGNOSIS — I5022 Chronic systolic (congestive) heart failure: Secondary | ICD-10-CM

## 2020-01-14 ENCOUNTER — Ambulatory Visit (HOSPITAL_COMMUNITY)
Admission: RE | Admit: 2020-01-14 | Discharge: 2020-01-14 | Disposition: A | Discharge status: Home / Self Care | Payer: Medicaid Other | Source: Ambulatory Visit | Attending: Gastroenterology | Admitting: Gastroenterology

## 2020-01-14 DIAGNOSIS — B181 Chronic viral hepatitis B without delta-agent: Secondary | ICD-10-CM | POA: Diagnosis present

## 2020-01-14 DIAGNOSIS — K7689 Other specified diseases of liver: Secondary | ICD-10-CM | POA: Diagnosis not present

## 2020-01-14 DIAGNOSIS — K7460 Unspecified cirrhosis of liver: Secondary | ICD-10-CM | POA: Insufficient documentation

## 2020-01-14 LAB — BASIC METABOLIC PANEL
BUN/Creatinine Ratio: 11 (ref 9–20)
BUN: 20 mg/dL (ref 6–24)
CO2: 24 mmol/L (ref 20–29)
Calcium: 9.9 mg/dL (ref 8.7–10.2)
Chloride: 104 mmol/L (ref 96–106)
Creatinine, Ser: 1.87 mg/dL — ABNORMAL HIGH (ref 0.76–1.27)
GFR calc Af Amer: 45 mL/min/{1.73_m2} — ABNORMAL LOW (ref >59)
GFR calc non Af Amer: 39 mL/min/{1.73_m2} — ABNORMAL LOW (ref >59)
Glucose: 97 mg/dL (ref 65–99)
Potassium: 3.8 mmol/L (ref 3.5–5.2)
Sodium: 143 mmol/L (ref 134–144)

## 2020-01-14 LAB — CBC WITH DIFFERENTIAL/PLATELET
Basophils Absolute: 0.1 10*3/uL (ref 0.0–0.2)
Basos: 1 %
EOS (ABSOLUTE): 0.2 10*3/uL (ref 0.0–0.4)
Eos: 2 %
Hematocrit: 53.9 % — ABNORMAL HIGH (ref 37.5–51.0)
Hemoglobin: 18.5 g/dL — ABNORMAL HIGH (ref 13.0–17.7)
Immature Grans (Abs): 0 10*3/uL (ref 0.0–0.1)
Immature Granulocytes: 0 %
Lymphocytes Absolute: 1.7 10*3/uL (ref 0.7–3.1)
Lymphs: 21 %
MCH: 31.3 pg (ref 26.6–33.0)
MCHC: 34.3 g/dL (ref 31.5–35.7)
MCV: 91 fL (ref 79–97)
Monocytes Absolute: 0.5 10*3/uL (ref 0.1–0.9)
Monocytes: 6 %
Neutrophils Absolute: 5.8 10*3/uL (ref 1.4–7.0)
Neutrophils: 70 %
Platelets: 237 10*3/uL (ref 150–450)
RBC: 5.91 x10E6/uL — ABNORMAL HIGH (ref 4.14–5.80)
RDW: 13.8 % (ref 11.6–15.4)
WBC: 8.2 10*3/uL (ref 3.4–10.8)

## 2020-01-16 ENCOUNTER — Other Ambulatory Visit (HOSPITAL_COMMUNITY)
Admission: RE | Admit: 2020-01-16 | Discharge: 2020-01-16 | Disposition: A | Discharge status: Home / Self Care | Payer: Medicaid Other | Source: Ambulatory Visit | Attending: Internal Medicine | Admitting: Internal Medicine

## 2020-01-16 DIAGNOSIS — Z01812 Encounter for preprocedural laboratory examination: Secondary | ICD-10-CM | POA: Diagnosis present

## 2020-01-16 DIAGNOSIS — Z20822 Contact with and (suspected) exposure to covid-19: Secondary | ICD-10-CM | POA: Insufficient documentation

## 2020-01-16 LAB — SARS CORONAVIRUS 2 (TAT 6-24 HRS): SARS Coronavirus 2: NEGATIVE

## 2020-01-16 MED ORDER — DEXTROSE 5 % IV SOLN
3.0000 g | INTRAVENOUS | Status: AC
  Administered 2020-01-17: 3 g via INTRAVENOUS
  Filled 2020-01-16: qty 3

## 2020-01-17 ENCOUNTER — Other Ambulatory Visit: Payer: Self-pay

## 2020-01-17 ENCOUNTER — Ambulatory Visit (HOSPITAL_COMMUNITY)
Admission: RE | Admit: 2020-01-17 | Discharge: 2020-01-17 | Disposition: A | Discharge status: Home / Self Care | Payer: Medicaid Other | Attending: Internal Medicine | Admitting: Internal Medicine

## 2020-01-17 ENCOUNTER — Ambulatory Visit (HOSPITAL_COMMUNITY)
Admission: RE | Disposition: A | Discharge status: Home / Self Care | Payer: Medicaid Other | Source: Home / Self Care | Attending: Internal Medicine

## 2020-01-17 DIAGNOSIS — I130 Hypertensive heart and chronic kidney disease with heart failure and stage 1 through stage 4 chronic kidney disease, or unspecified chronic kidney disease: Secondary | ICD-10-CM | POA: Diagnosis not present

## 2020-01-17 DIAGNOSIS — N184 Chronic kidney disease, stage 4 (severe): Secondary | ICD-10-CM | POA: Diagnosis not present

## 2020-01-17 DIAGNOSIS — E079 Disorder of thyroid, unspecified: Secondary | ICD-10-CM | POA: Diagnosis not present

## 2020-01-17 DIAGNOSIS — Z7901 Long term (current) use of anticoagulants: Secondary | ICD-10-CM | POA: Diagnosis not present

## 2020-01-17 DIAGNOSIS — Z87891 Personal history of nicotine dependence: Secondary | ICD-10-CM | POA: Diagnosis not present

## 2020-01-17 DIAGNOSIS — Z79899 Other long term (current) drug therapy: Secondary | ICD-10-CM | POA: Insufficient documentation

## 2020-01-17 DIAGNOSIS — K7460 Unspecified cirrhosis of liver: Secondary | ICD-10-CM | POA: Insufficient documentation

## 2020-01-17 DIAGNOSIS — Z885 Allergy status to narcotic agent status: Secondary | ICD-10-CM | POA: Insufficient documentation

## 2020-01-17 DIAGNOSIS — Z7951 Long term (current) use of inhaled steroids: Secondary | ICD-10-CM | POA: Insufficient documentation

## 2020-01-17 DIAGNOSIS — Z8249 Family history of ischemic heart disease and other diseases of the circulatory system: Secondary | ICD-10-CM | POA: Diagnosis not present

## 2020-01-17 DIAGNOSIS — I2510 Atherosclerotic heart disease of native coronary artery without angina pectoris: Secondary | ICD-10-CM | POA: Insufficient documentation

## 2020-01-17 DIAGNOSIS — Z4502 Encounter for adjustment and management of automatic implantable cardiac defibrillator: Secondary | ICD-10-CM | POA: Insufficient documentation

## 2020-01-17 DIAGNOSIS — F419 Anxiety disorder, unspecified: Secondary | ICD-10-CM | POA: Diagnosis not present

## 2020-01-17 DIAGNOSIS — Z8673 Personal history of transient ischemic attack (TIA), and cerebral infarction without residual deficits: Secondary | ICD-10-CM | POA: Diagnosis not present

## 2020-01-17 DIAGNOSIS — F329 Major depressive disorder, single episode, unspecified: Secondary | ICD-10-CM | POA: Diagnosis not present

## 2020-01-17 DIAGNOSIS — G4730 Sleep apnea, unspecified: Secondary | ICD-10-CM | POA: Insufficient documentation

## 2020-01-17 DIAGNOSIS — J45909 Unspecified asthma, uncomplicated: Secondary | ICD-10-CM | POA: Insufficient documentation

## 2020-01-17 DIAGNOSIS — I252 Old myocardial infarction: Secondary | ICD-10-CM | POA: Insufficient documentation

## 2020-01-17 DIAGNOSIS — I4819 Other persistent atrial fibrillation: Secondary | ICD-10-CM | POA: Insufficient documentation

## 2020-01-17 DIAGNOSIS — I5022 Chronic systolic (congestive) heart failure: Secondary | ICD-10-CM | POA: Diagnosis not present

## 2020-01-17 DIAGNOSIS — Z7982 Long term (current) use of aspirin: Secondary | ICD-10-CM | POA: Insufficient documentation

## 2020-01-17 DIAGNOSIS — I255 Ischemic cardiomyopathy: Secondary | ICD-10-CM

## 2020-01-17 HISTORY — PX: ICD GENERATOR CHANGEOUT: EP1231

## 2020-01-17 SURGERY — ICD GENERATOR CHANGEOUT

## 2020-01-17 MED ORDER — LIDOCAINE HCL (PF) 1 % IJ SOLN
INTRAMUSCULAR | Status: DC | PRN
  Administered 2020-01-17: 60 mL

## 2020-01-17 MED ORDER — MIDAZOLAM HCL 5 MG/5ML IJ SOLN
INTRAMUSCULAR | Status: AC
  Filled 2020-01-17: qty 5

## 2020-01-17 MED ORDER — CHLORHEXIDINE GLUCONATE 4 % EX LIQD
4.0000 "application " | Freq: Once | CUTANEOUS | Status: DC
  Filled 2020-01-17: qty 60

## 2020-01-17 MED ORDER — FENTANYL CITRATE (PF) 100 MCG/2ML IJ SOLN
INTRAMUSCULAR | Status: DC | PRN
Start: 2020-01-17 — End: 2020-01-17
  Administered 2020-01-17: 25 ug via INTRAVENOUS
  Administered 2020-01-17: 12.5 ug via INTRAVENOUS
  Administered 2020-01-17: 25 ug via INTRAVENOUS
  Administered 2020-01-17: 12.5 ug via INTRAVENOUS

## 2020-01-17 MED ORDER — SODIUM CHLORIDE 0.9 % IV SOLN: INTRAVENOUS | Status: DC

## 2020-01-17 MED ORDER — MIDAZOLAM HCL 5 MG/5ML IJ SOLN
INTRAMUSCULAR | Status: DC | PRN
  Administered 2020-01-17 (×2): 1 mg via INTRAVENOUS
  Administered 2020-01-17 (×2): 2 mg via INTRAVENOUS

## 2020-01-17 MED ORDER — FENTANYL CITRATE (PF) 100 MCG/2ML IJ SOLN
INTRAMUSCULAR | Status: AC
  Filled 2020-01-17: qty 2

## 2020-01-17 MED ORDER — SODIUM CHLORIDE 0.9 % IV SOLN
80.0000 mg | INTRAVENOUS | Status: AC
  Administered 2020-01-17: 80 mg

## 2020-01-17 MED ORDER — LIDOCAINE HCL 1 % IJ SOLN
INTRAMUSCULAR | Status: AC
  Filled 2020-01-17: qty 60

## 2020-01-17 MED ORDER — HEPARIN (PORCINE) IN NACL 1000-0.9 UT/500ML-% IV SOLN
INTRAVENOUS | Status: AC
  Filled 2020-01-17: qty 1000

## 2020-01-17 MED ORDER — SODIUM CHLORIDE 0.9 % IV SOLN
INTRAVENOUS | Status: AC
  Filled 2020-01-17: qty 2

## 2020-01-17 SURGICAL SUPPLY — 4 items
CABLE SURGICAL S-101-97-12 (CABLE) ×3 IMPLANT
ICD VISIA MRI DVFB1D1 (ICD Generator) ×2 IMPLANT
PAD PRO RADIOLUCENT 2001M-C (PAD) ×3 IMPLANT
TRAY PACEMAKER INSERTION (PACKS) ×3 IMPLANT

## 2020-01-17 NOTE — Discharge Instructions (Signed)
° °

## 2020-01-17 NOTE — H&P (Signed)
HPI Cody Ortega returns today for followup. He is a pleasant 59 yo man with an ICM, s/p MI, chronic class 2 CHF, persistent atrial fib who has maintained NSR on amiodarone. He denies chest pain. He admits to being sedentary during Covid. He has not been vaccinated. He denies syncope or ICD therapy. He has mild peripheral edema.      Allergies  Allergen Reactions  . Codeine Itching, Nausea And Vomiting and Hives  . Hydrocodone-Acetaminophen Itching and Nausea And Vomiting           Current Outpatient Medications  Medication Sig Dispense Refill  . albuterol (VENTOLIN HFA) 108 (90 Base) MCG/ACT inhaler INHALE 2 PUFFS INTO THE LUNGS EVERY 6 HOURS AS NEEDED FOR WHEEZING OR SHORTNESS OF BREATH 8.5 g 6  . alprazolam (XANAX) 2 MG tablet Take 2 mg by mouth every 6 (six) hours as needed for anxiety.     Marland Kitchen amLODipine (NORVASC) 10 MG tablet TAKE 1 TABLET BY MOUTH EVERY DAY 30 tablet 6  . aspirin 81 MG EC tablet Take 81 mg by mouth at bedtime.     . budesonide-formoterol (SYMBICORT) 160-4.5 MCG/ACT inhaler Inhale 2 puffs into the lungs 2 (two) times daily. 10.2 g 6  . carvedilol (COREG) 12.5 MG tablet TAKE 1 TABLET(12.5 MG) BY MOUTH TWICE DAILY WITH A MEAL 180 tablet 3  . cycloSPORINE (RESTASIS) 0.05 % ophthalmic emulsion Place 1 drop into both eyes 2 (two) times daily.    . DULoxetine (CYMBALTA) 60 MG capsule Take 60 mg by mouth daily.    Marland Kitchen entecavir (BARACLUDE) 0.5 MG tablet Take 1 tablet (0.5 mg total) by mouth every other day. Please note: Every OTHER day. Please call in July and schedule a 6 month follow up in August. Thank you 45 tablet 1  . ezetimibe (ZETIA) 10 MG tablet TAKE 1 TABLET(10 MG) BY MOUTH DAILY 30 tablet 5  . ferrous sulfate 325 (65 FE) MG tablet Take 325 mg by mouth daily with breakfast.    . fluticasone (FLONASE) 50 MCG/ACT nasal spray Place 1 spray into both nostrils daily. 16 g 2  . hydrALAZINE (APRESOLINE) 50 MG tablet Take 1 tablet (50 mg total) by  mouth 2 (two) times daily. 180 tablet 3  . ondansetron (ZOFRAN) 4 MG tablet Take 4 mg by mouth 2 (two) times daily as needed for nausea or vomiting.   0  . sildenafil (REVATIO) 20 MG tablet TAKE TWO TO FIVE TABLETS BY MOUTH DAILY AS NEEDED FOR ERECTILE DYSFUNCTION 100 tablet 0  . tamsulosin (FLOMAX) 0.4 MG CAPS capsule TAKE 1 CAPSULE(0.4 MG) BY MOUTH DAILY 30 capsule 2  . XARELTO 20 MG TABS tablet TAKE 1 TABLET(20 MG) BY MOUTH DAILY WITH SUPPER 90 tablet 1  . amiodarone (PACERONE) 200 MG tablet Take one tablet by mouth daily Monday through Saturday.  Do NOT take on Sunday. 90 tablet 3   No current facility-administered medications for this visit.         Past Medical History:  Diagnosis Date  . Anxiety   . Arthritis   . Benzodiazepine dependence ([place])   . Bronchial asthma   . CAD (coronary artery disease)    a. Cath 03/2010: mod RCA stenosis, severe diag stenosis, treated medically given lack of angina.  . 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 Chronic systolic congestive heart failure (HCC)    Acute decompensation, LVEF less than 20%  . Cirrhosis ([place])    secondary  to hepatitis B  . CKD (chronic kidney disease), stage IV (HCC)    Stage 3-4  . Depression   . Diverticulosis   . Hepatitis B   . History of cocaine abuse ([place])   . Hypertension   . Microcytic anemia   . PAF (paroxysmal atrial fibrillation) ([place])    a. Episode in 2008 with spontaneous conversion  . S/P implantation of automatic cardioverter/defibrillator (AICD)    a. Medtronic, implanted 2012.  . Sleep apnea   . Stroke [facility]) 2004  . Thyroid disease     ROS:   All systems reviewed and negative except as noted in the HPI.        Past Surgical History:  Procedure Laterality Date  . CARDIOVERSION N/A 01/07/2014   Procedure: CARDIOVERSION;  Surgeon: Lelon Perla, MD;  Location[facility] ENDOSCOPY;  Service: Cardiovascular;  Laterality: N/A;  . CARDIOVERSION N/A  02/11/2014   Procedure: CARDIOVERSION;  Surgeon: Pixie Casino, MD;  Location: [place];  Service: Cardiovascular;  Laterality: N/A;  . COLONOSCOPY WITH PROPOFOL N/A 02/06/2018   Procedure: COLONOSCOPY WITH PROPOFOL;  Surgeon: Yetta Flock, MD;  Location: [facility] ENDOSCOPY;  Service: Gastroenterology;  Laterality: N/A;  . LASEK eye surgery Bilateral 05/2018  . LASIK Bilateral   . PACEMAKER INSERTION    . POLYPECTOMY  02/06/2018   Procedure: POLYPECTOMY;  Surgeon: Yetta Flock, MD;  Location: [facility] ENDOSCOPY;  Service: Gastroenterology;;  . TEE WITHOUT CARDIOVERSION N/A 01/07/2014   Procedure: TRANSESOPHAGEAL ECHOCARDIOGRAM (TEE);  Surgeon: Lelon Perla, MD;  Location: [facility] ENDOSCOPY;  Service: Cardiovascular;  Laterality: N/A;  . TEE WITHOUT CARDIOVERSION N/A 02/11/2014   Procedure: TRANSESOPHAGEAL ECHOCARDIOGRAM (TEE);  Surgeon: Pixie Casino, MD;  Location: [facility] ENDOSCOPY;  Service: Cardiovascular;  Laterality: N/A;  . TONSILLECTOMY    . TRANSTHORACIC ECHOCARDIOGRAM  10/2006, 12/2009          Family History  Problem Relation Age of Onset  . Breast cancer Mother   . Multiple myeloma Mother   . Prostate cancer Father   . Diabetes Father   . Peripheral vascular disease Father   . Cerebral palsy Daughter   . Lung disease Neg Hx   . Colon cancer Neg Hx   . Stomach cancer Neg Hx   . Esophageal cancer Neg Hx   . Colon polyps Neg Hx      Social History        Socioeconomic History  . Marital status: Divorced    Spouse name: Not on file  . Number of children: 2  . Years of education: Not on file  . Highest education level: Not on file  Occupational History  . Occupation: Merchandiser, retail: UNEMPLOYED    Comment: Writer in the past  Tobacco Use  . Smoking status: Former Smoker    Packs/day: 0.50    Years: 0.50    Pack years: 0.25    Types: Cigarettes    Quit date: 05/02/2010    Years since  quitting: 9.4  . Smokeless tobacco: Never Used  . Tobacco comment: Significant Second-hand and only 3 months himself  Vaping Use  . Vaping Use: Never used  Substance and Sexual Activity  . Alcohol use: Not Currently    Alcohol/week: 0.0 standard drinks    Comment: rare  . Drug use: No    Comment: Reported history of cocaine abuse off and on   . Sexual activity: Not on file  Other Topics Concern  . Not on  file  Social History Narrative   Living with a son who is a 25 year old.  He is not     working, unemployed for 3 years.  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  Denied use of alcohol.  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       [place] [facility]:   Originally from [place], [place]. He grew up in [place]. He moved to [place] in 1985. 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 No pets currently. No bird exposure. He did have mold in a previous home. No hot tub exposure.         Social Determinants of Health      Financial Resource Strain:   . Difficulty of Paying Living Expenses:   Food Insecurity:   . Worried About Charity fundraiser in the Last Year:   . Arboriculturist in the Last Year:   Transportation Needs:   . Film/video editor (Medical):   Marland Kitchen Lack of Transportation (Non-Medical):   Physical Activity:   . Days of Exercise per Week:   . Minutes of Exercise per Session:   Stress:   . Feeling of Stress :   Social Connections:   . Frequency of Communication with Friends and Family:   . Frequency of Social Gatherings with Friends and Family:   . Attends Religious Services:   . Active Member of Clubs or Organizations:   . Attends Archivist Meetings:   Marland Kitchen Marital Status:   Intimate Partner Violence:   . Fear of Current or Ex-Partner:   . Emotionally Abused:   Marland Kitchen Physically Abused:   . Sexually Abused:      BP 102/62   Pulse 63   Ht _0  (1.905 m)   Wt  280 lb (127 kg)   SpO2 95%   BMI 35.00 kg/m   Physical Exam:  Well appearing NAD HEENT: Unremarkable Neck:  No JVD, no thyromegally Lymphatics:  No adenopathy Back:  No CVA tenderness Lungs:  Clear with no wheezes HEART:  Regular rate rhythm, no murmurs, no rubs, no clicks Abd:  soft, positive bowel sounds, no organomegally, no rebound, no guarding Ext:  2 plus pulses, no edema, no cyanosis, no clubbing Skin:  No rashes no nodules Neuro:  CN II through XII intact, motor grossly intact  EKG - NSR with prior MI  DEVICE  Normal device function.  See PaceArt for details. Approaching ERI.  Assess/Plan: 1. Chronic systolic heart failure -his symptoms are class 2. I asked him to reduce his salt intake and continue his current meds. 2. Persistent atrial fib - he is maintaining NSR. He will reduce his dose of amiodarone to 200 mg daily, Mon-Sat and none on Sunday. 3. ICD - he is approacing ERI. We will schedule ICD gen change out in 3 months. 4. ICM - he is s/p MI. He is sedentary. He denies anginal symptoms.   Ponciano Ort.     EP Attending  Patient seen and examined. Since prior clinic visit, no change. He will undergo ICD gen change out.  Carleene Overlie Nathalia Wismer,MD

## 2020-01-20 ENCOUNTER — Encounter (HOSPITAL_COMMUNITY): Payer: Self-pay | Admitting: Internal Medicine

## 2020-01-20 MED FILL — Heparin Sod (Porcine)-NaCl IV Soln 1000 Unit/500ML-0.9%: INTRAVENOUS | Qty: 1000 | Status: AC

## 2020-01-20 MED FILL — Lidocaine HCl Local Inj 1%: INTRAMUSCULAR | Qty: 60 | Status: AC

## 2020-01-28 ENCOUNTER — Other Ambulatory Visit: Payer: Self-pay

## 2020-01-28 ENCOUNTER — Ambulatory Visit (INDEPENDENT_AMBULATORY_CARE_PROVIDER_SITE_OTHER): Payer: Medicaid Other | Admitting: Emergency Medicine

## 2020-01-28 DIAGNOSIS — Z95810 Presence of automatic (implantable) cardiac defibrillator: Secondary | ICD-10-CM

## 2020-01-28 DIAGNOSIS — I495 Sick sinus syndrome: Secondary | ICD-10-CM

## 2020-01-28 DIAGNOSIS — I429 Cardiomyopathy, unspecified: Secondary | ICD-10-CM

## 2020-01-28 DIAGNOSIS — I428 Other cardiomyopathies: Secondary | ICD-10-CM

## 2020-01-28 LAB — CUP PACEART INCLINIC DEVICE CHECK
Battery Remaining Longevity: 137 mo
Battery Voltage: 3.16 V
Brady Statistic RV Percent Paced: 0.23 %
Date Time Interrogation Session: 20210928153106
HighPow Impedance: 47 Ohm
HighPow Impedance: 68 Ohm
Implantable Lead Implant Date: 20120806
Implantable Lead Location: 753860
Implantable Lead Model: 185
Implantable Lead Serial Number: 345870
Implantable Pulse Generator Implant Date: 20210917
Lead Channel Impedance Value: 342 Ohm
Lead Channel Impedance Value: 342 Ohm
Lead Channel Pacing Threshold Amplitude: 1.25 V
Lead Channel Pacing Threshold Pulse Width: 0.4 ms
Lead Channel Sensing Intrinsic Amplitude: 9.25 mV
Lead Channel Setting Pacing Amplitude: 2.75 V
Lead Channel Setting Pacing Pulse Width: 0.4 ms
Lead Channel Setting Sensing Sensitivity: 0.3 mV

## 2020-01-28 NOTE — Progress Notes (Signed)
Wound check appointment. Steri-strips removed. Wound without redness or edema. Incision edges approximated, wound well healed. Normal device function. Thresholds, sensing, and impedances consistent with implant measurements. Device programmed at chronic  settings due to mature lead. Histogram distribution appropriate for patient and level of activity. No ventricular arrhythmias noted. Patient educated about wound care, shock plan. ROV with Dr Lovena Le 04/28/20.Enrolled in remote follow-up and next remote scheduled for 04/17/20.

## 2020-02-12 DIAGNOSIS — N1832 Chronic kidney disease, stage 3b: Secondary | ICD-10-CM | POA: Diagnosis not present

## 2020-02-12 DIAGNOSIS — R350 Frequency of micturition: Secondary | ICD-10-CM | POA: Diagnosis not present

## 2020-02-12 DIAGNOSIS — M899 Disorder of bone, unspecified: Secondary | ICD-10-CM | POA: Diagnosis not present

## 2020-02-12 DIAGNOSIS — I129 Hypertensive chronic kidney disease with stage 1 through stage 4 chronic kidney disease, or unspecified chronic kidney disease: Secondary | ICD-10-CM | POA: Diagnosis not present

## 2020-02-12 DIAGNOSIS — D751 Secondary polycythemia: Secondary | ICD-10-CM | POA: Diagnosis not present

## 2020-02-14 ENCOUNTER — Other Ambulatory Visit: Payer: Self-pay | Admitting: Nephrology

## 2020-02-14 DIAGNOSIS — N1832 Chronic kidney disease, stage 3b: Secondary | ICD-10-CM

## 2020-02-20 ENCOUNTER — Ambulatory Visit
Admission: RE | Admit: 2020-02-20 | Discharge: 2020-02-20 | Disposition: A | Discharge status: Home / Self Care | Payer: Medicaid Other | Source: Ambulatory Visit | Attending: Nephrology | Admitting: Nephrology

## 2020-02-20 DIAGNOSIS — N281 Cyst of kidney, acquired: Secondary | ICD-10-CM | POA: Diagnosis not present

## 2020-02-20 DIAGNOSIS — N1832 Chronic kidney disease, stage 3b: Secondary | ICD-10-CM

## 2020-02-20 DIAGNOSIS — N183 Chronic kidney disease, stage 3 unspecified: Secondary | ICD-10-CM | POA: Diagnosis not present

## 2020-02-21 ENCOUNTER — Ambulatory Visit (INDEPENDENT_AMBULATORY_CARE_PROVIDER_SITE_OTHER): Payer: Medicaid Other | Admitting: Gastroenterology

## 2020-02-21 ENCOUNTER — Encounter: Payer: Self-pay | Admitting: Gastroenterology

## 2020-02-21 VITALS — BP 138/78 | HR 67 | Ht 75.0 in | Wt 288.0 lb

## 2020-02-21 DIAGNOSIS — K649 Unspecified hemorrhoids: Secondary | ICD-10-CM

## 2020-02-21 DIAGNOSIS — B181 Chronic viral hepatitis B without delta-agent: Secondary | ICD-10-CM

## 2020-02-21 DIAGNOSIS — Z86010 Personal history of colonic polyps: Secondary | ICD-10-CM | POA: Diagnosis not present

## 2020-02-21 DIAGNOSIS — K5900 Constipation, unspecified: Secondary | ICD-10-CM

## 2020-02-21 DIAGNOSIS — K7460 Unspecified cirrhosis of liver: Secondary | ICD-10-CM

## 2020-02-21 MED ORDER — POLYETHYLENE GLYCOL 3350 17 G PO PACK: PACK | ORAL | 0 refills | Status: DC

## 2020-02-21 NOTE — Progress Notes (Signed)
HPI :  59 year old male here for follow-up visit for hep B associated cirrhosis.  Please see prior notes for details of his case.  He has a complicated history with CHF, ICD in place, A. fib on Xarelto, history of stroke.  He is also had symptomatic hemorrhoids that bother bother him.  Diagnosed with hepatitis B in August 2019, high viral load with positive E antigen.  Liver imaging at the time was consistent with cirrhosis.  He was started on entecavir 0.5 mg every other day due to his CKD.  Over time his viral load has decreased and has since become undetectable.  Most recent labs in August continue to show negative hepatitis B viral load, and interestingly he seroconverted in his hepatitis B E antigen is now negative.  His ALT and AST remain normal.  He is compliant with the regimen and tolerating it well.  He has for the most part been compensated, denies any problems with lower extremity edema but has had to use some Lasix and heart failure managed by cardiology for some pulmonary congestion.  He has been maintained on Coreg taking twice daily so we have not pursued variceal screening.  He held his Xarelto when he had his pacemaker battery switched per his report, he states he was never told that he needed to resume it so he never did.  He does not think his cardiologist is aware of this.  He has had some symptomatic hemorrhoids that have bothered him.  He has ongoing intermittent with constipation and having hard stools which make his hemorrhoids much worse.  I provided him some Anusol suppositories previously.  He has been taking fiber for his bowels but has not soften his stools and is becoming frustrated by this.  He has not been using much of any MiraLAX lately. His last colonoscopy was done in 2019 as below  Colonoscopy 02/06/2018 - 7 small polyps, diverticulosis, hemorrhoids - adenomas - repeat in 3 years  RUQ [place] 01/14/20 - IMPRESSION: 1. Cirrhosis of the liver, negative for solid mass  lesion by ultrasound. 2. Otherwise negative examination  Hep B DNA negative 12/11/19  AFP negative INR 1.3 LFTs normal Hep B E Ag negative (previously positive)   Past Medical History:  Diagnosis Date  . Anxiety   . Arthritis   . Benzodiazepine dependence ([place])   . Bronchial asthma   . CAD (coronary artery disease)    a. Cath 03/2010: mod RCA stenosis, severe diag stenosis, treated medically given lack of angina.  . 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 Chronic systolic congestive heart failure (HCC)    Acute decompensation, LVEF less than 20%  . Cirrhosis ([place])    secondary to hepatitis B  . CKD (chronic kidney disease), stage IV (HCC)    Stage 3-4  . Depression   . Diverticulosis   . Hepatitis B   . History of cocaine abuse ([place])   . Hypertension   . Microcytic anemia   . PAF (paroxysmal atrial fibrillation) ([place])    a. Episode in 2008 with spontaneous conversion  . S/P implantation of automatic cardioverter/defibrillator (AICD)    a. Medtronic, implanted 2012.  . Sleep apnea   . Stroke [facility]) 2004  . Thyroid disease      Past Surgical History:  Procedure Laterality Date  . CARDIOVERSION N/A 01/07/2014   Procedure: CARDIOVERSION;  Surgeon: Lelon Perla, MD;  Location: [facility] ENDOSCOPY;  Service: Cardiovascular;  Laterality: N/A;  . CARDIOVERSION N/A 02/11/2014  Procedure: CARDIOVERSION;  Surgeon: Pixie Casino, MD;  Location: [facility] ENDOSCOPY;  Service: Cardiovascular;  Laterality: N/A;  . COLONOSCOPY WITH PROPOFOL N/A 02/06/2018   Procedure: COLONOSCOPY WITH PROPOFOL;  Surgeon: Yetta Flock, MD;  [facility] ENDOSCOPY;  Service: Gastroenterology;  Laterality: N/A;  . ICD GENERATOR CHANGEOUT N/A 01/17/2020   Procedure: [place];  Surgeon: Evans Lance, MD;  Location: [place] [facility] LAB;  Service: Cardiovascular;  Laterality: N/A;  . LASEK eye surgery Bilateral 05/2018  . LASIK Bilateral   . PACEMAKER INSERTION    . POLYPECTOMY   02/06/2018   Procedure: POLYPECTOMY;  Surgeon: Yetta Flock, MD;  Location[facility] ENDOSCOPY;  Service: Gastroenterology;;  . TEE WITHOUT CARDIOVERSION N/A 01/07/2014   Procedure: TRANSESOPHAGEAL ECHOCARDIOGRAM (TEE);  Surgeon: Lelon Perla, MD;  Location[facility] ENDOSCOPY;  Service: Cardiovascular;  Laterality: N/A;  . TEE WITHOUT CARDIOVERSION N/A 02/11/2014   Procedure: TRANSESOPHAGEAL ECHOCARDIOGRAM (TEE);  Surgeon: Pixie Casino, MD;  Location: [facility] ENDOSCOPY;  Service: Cardiovascular;  Laterality: N/A;  . TONSILLECTOMY    . TRANSTHORACIC ECHOCARDIOGRAM  10/2006, 12/2009   Family History  Problem Relation Age of Onset  . Breast cancer Mother   . Multiple myeloma Mother   . Prostate cancer Father   . Diabetes Father   . Peripheral vascular disease Father   . Cerebral palsy Daughter   . Lung disease Neg Hx   . Colon cancer Neg Hx   . Stomach cancer Neg Hx   . Esophageal cancer Neg Hx   . Colon polyps Neg Hx    Social History   Tobacco Use  . Smoking status: Former Smoker    Packs/day: 0.50    Years: 0.50    Pack years: 0.25    Types: Cigarettes    Quit date: 05/02/2010    Years since quitting: 9.8  . Smokeless tobacco: Never Used  . Tobacco comment: Significant Second-hand and only 3 months himself  Vaping Use  . Vaping Use: Never used  Substance Use Topics  . Alcohol use: Not Currently    Alcohol/week: 0.0 standard drinks    Comment: rare  . Drug use: No    Comment: Reported history of cocaine abuse off and on    Current Outpatient Medications  Medication Sig Dispense Refill  . albuterol (VENTOLIN HFA) 108 (90 Base) MCG/ACT inhaler INHALE 2 PUFFS INTO THE LUNGS EVERY 6 HOURS AS NEEDED FOR WHEEZING OR SHORTNESS OF BREATH (Patient taking differently: Inhale 2 puffs into the lungs every 6 (six) hours as needed for wheezing or shortness of breath. ) 8.5 g 6  . alprazolam (XANAX) 2 MG tablet Take 2 mg by mouth every 6 (six) hours as needed for anxiety.     Marland Kitchen amiodarone  (PACERONE) 200 MG tablet Take one tablet by mouth daily Monday through Saturday.  Do NOT take on Sunday. (Patient taking differently: Take 200 mg by mouth See admin instructions. Take 200 mg mouth daily Monday through Saturday.  Do NOT take on Sunday.) 90 tablet 3  . amLODipine (NORVASC) 10 MG tablet TAKE 1 TABLET BY MOUTH EVERY DAY (Patient taking differently: Take 10 mg by mouth daily. ) 30 tablet 6  . budesonide-formoterol (SYMBICORT) 160-4.5 MCG/ACT inhaler Inhale 2 puffs into the lungs 2 (two) times daily. 10.2 g 6  . carvedilol (COREG) 12.5 MG tablet TAKE 1 TABLET(12.5 MG) BY MOUTH TWICE DAILY WITH A MEAL (Patient taking differently: Take 12.5 mg by mouth 2 (two) times daily with a meal. )  180 tablet 3  . cycloSPORINE (RESTASIS) 0.05 % ophthalmic emulsion Place 1 drop into both eyes 2 (two) times daily.    Marland Kitchen Dextran 70-Hypromellose, PF, (TEARS NATURALE FREE) 0.1-0.3 % SOLN Place 1 drop into both eyes daily as needed (Dry eyes).    . DULoxetine (CYMBALTA) 30 MG capsule Take 90 mg by mouth daily.     Marland Kitchen entecavir (BARACLUDE) 0.5 MG tablet Take 1 tablet (0.5 mg total) by mouth every other day. Please note: Every OTHER day. Please call in July and schedule a 6 month follow up in August. Thank you 45 tablet 1  . ezetimibe (ZETIA) 10 MG tablet TAKE 1 TABLET(10 MG) BY MOUTH DAILY (Patient taking differently: Take 10 mg by mouth daily. ) 30 tablet 5  . ferrous sulfate 325 (65 FE) MG tablet Take 325 mg by mouth daily with breakfast.    . fluticasone (FLONASE) 50 MCG/ACT nasal spray Place 1 spray into both nostrils daily. (Patient taking differently: Place 1 spray into both nostrils at bedtime. ) 16 g 2  . furosemide (LASIX) 40 MG tablet Take 1 tablet (40 mg total) by mouth daily. 90 tablet 3  . hydrALAZINE (APRESOLINE) 50 MG tablet Take 1 tablet (50 mg total) by mouth 2 (two) times daily. 180 tablet 3  . Olopatadine HCl 0.2 % SOLN Apply 1 drop to eye daily.    . ondansetron (ZOFRAN-ODT) 8 MG disintegrating  tablet Take 8 mg by mouth 2 (two) times daily as needed.    Marland Kitchen oxybutynin (DITROPAN) 5 MG tablet Take 5 mg by mouth 3 (three) times daily.    . phenylephrine-shark liver oil-mineral oil-petrolatum (HEMORRHOIDAL) 0.25-14-74.9 % rectal ointment Place 1 application rectally 2 (two) times daily as needed for hemorrhoids.    . sildenafil (REVATIO) 20 MG tablet Take 1 tablet (20 mg total) by mouth daily as needed (Take 2-5 tablets once daily as needed prior to sexual activity.). 100 tablet 3  . tamsulosin (FLOMAX) 0.4 MG CAPS capsule TAKE 1 CAPSULE(0.4 MG) BY MOUTH DAILY (Patient taking differently: Take 0.4 mg by mouth in the morning and at bedtime. ) 30 capsule 2  . XARELTO 20 MG TABS tablet TAKE 1 TABLET(20 MG) BY MOUTH DAILY WITH SUPPER (Patient not taking: Reported on 02/21/2020) 90 tablet 1   No current facility-administered medications for this visit.   Allergies  Allergen Reactions  . Codeine Itching, Nausea And Vomiting and Hives  . Hydrocodone-Acetaminophen Itching and Nausea And Vomiting     Review of Systems: All systems reviewed and negative except where noted in HPI.   Lab Results  Component Value Date   CREATININE 1.87 (H) 01/13/2020   BUN 20 01/13/2020   NA 143 01/13/2020   K 3.8 01/13/2020   CL 104 01/13/2020   CO2 24 01/13/2020    Lab Results  Component Value Date   ALT 11 12/11/2019   AST 12 12/11/2019   ALKPHOS 70 12/11/2019   BILITOT 0.5 12/11/2019    Lab Results  Component Value Date   WBC 8.2 01/13/2020   HGB 18.5 (H) 01/13/2020   HCT 53.9 (H) 01/13/2020   MCV 91 01/13/2020   PLT 237 01/13/2020    Lab Results  Component Value Date   INR 1.3 (H) 12/11/2019   INR 1.14 12/25/2013   INR 1.0 11/29/2010     Physical Exam: BP 138/78   Pulse 67   Ht _0  (1.905 m)   Wt 288 lb (130.6 kg)   SpO2 95%  BMI 36.00 kg/m  Constitutional: Pleasant,well-developed, male in no acute distress. HEENT: Normocephalic and atraumatic. Conjunctivae are normal. No  scleral icterus. Neck supple.  Cardiovascular: Normal rate, regular rhythm.  Pulmonary/chest: Effort normal and breath sounds normal. No wheezing, rales or rhonchi. Abdominal: Soft, nondistended, nontender.  There are no masses palpable. Extremities: no edema Lymphadenopathy: No cervical adenopathy noted. Neurological: Alert and oriented to person place and time. Skin: Skin is warm and dry. No rashes noted. Psychiatric: Normal mood and affect. Behavior is normal.   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  He was placed on entecavir 0.5 mg every other day given his CKD.  His viral load has become and stayed undetectable and his ALT has normalized.  Most recently his hepatitis B E antigen became negative.  We discussed this for a bit.  Overall he is doing pretty well in regards to his cirrhosis.  [place] screening up-to-date we will continue this every 6 months, he is due next for an ultrasound next March.  He is due for repeated labs next February, I will send another hepatitis B E antigen at that time I will also send hepatitis B E antibody to confirm this seroconversion.  That being said given his high viral load at diagnosis and his history of cirrhosis, I would continue therapy indefinitely for now given risk of decompensation should he have a relapse of hep B.  He agreed with the plan.  Continue Coreg, no need for screening EGD in this light.  He agreed  Constipation /internal hemorrhoids - recommend he stop daily fiber and use MiraLAX daily and titrate up as needed to keep stools soft and prevent straining.  Given his anticoagulation and his history of cirrhosis I think he is high risk for bleeding in regards to surgical and banding management and would recommend he avoid that if at all possible.  He verbalized understanding.   Hopefully with management of his constipation his hemorrhoids will not bother him too much moving forward.    History of colon polyps - he is due for surveillance colonoscopy next year  Of note, patient states he has stopped his anticoagulation, it would seem to me he has indications to continue this and I will touch base with his cardiologist to clarify this for the patient, as he likely needs to resume this.   [place] Cellar, MD [facility]

## 2020-02-21 NOTE — Patient Instructions (Addendum)
If you are age 57 or older, your body mass index should be between 23-30. Your Body mass index is 36 kg/m. If this is out of the aforementioned range listed, please consider follow up with your Primary Care Provider.  If you are age 9 or younger, your body mass index should be between 19-25. Your Body mass index is 36 kg/m. If this is out of the aformentioned range listed, please consider follow up with your Primary Care Provider.   Continue entecavir (Baraclude).  Please purchase the following medications over the counter and take as directed: Miralax: Take as directed, daily. Titrate as needed  We have given you samples of the following medication to take:  Fiber Choice: Take as directed   You will be due for labs in 06-2020. We will contact you when it is time to go to the lab,  You will be due for a RUQ ultrasound of your liver in 06-2020. We will contact you when it is time to schedule.   Thank you for entrusting me with your care and for choosing [facility], Dr. [place] Cellar

## 2020-02-24 ENCOUNTER — Telehealth: Payer: Self-pay

## 2020-02-24 NOTE — Telephone Encounter (Signed)
Called and LM for pt to continue Xarelto

## 2020-02-24 NOTE — Telephone Encounter (Signed)
Left message to call back  

## 2020-02-24 NOTE — Telephone Encounter (Signed)
-----   Message from Yetta Flock, MD sent at 02/24/2020  5:23 PM EDT ----- Regarding: FW: mutual patient Jan can you please let this patient know that I spoke with his cardiologist, he is supposed to continue his Xarelto. Thanks ----- Message ----- From: [name], MD Sent: 02/24/2020   5:11 PM EDT To: Theodoro Parma, RN, Yetta Flock, MD Subject: RE: mutual patient                             Yes he should be on it. PAF and high Chads-Vasc. I will ask my nurse, Valetta Fuller to call him and clarify. Thanks for letting me know Richardson Landry ----- Message ----- From: Yetta Flock, MD Sent: 02/21/2020   6:07 PM EDT To: [name], MD Subject: mutual patient                                 Candice Camp, Saw this mutual patient today.  He mentioned to me in passing that he stopped his Xarelto, unclear why, it would seem to me that you would want him to continue on this.  I told him I would reach out to you to clarify.  We will have him resume it if that is what your intention is.  Thanks hope you are well.  Richardson Landry

## 2020-02-24 NOTE — Telephone Encounter (Signed)
-----   Message from [name], MD sent at 02/24/2020  5:11 PM EDT ----- Regarding: RE: mutual patient Yes he should be on it. PAF and high Chads-Vasc. I will ask my nurse, Valetta Fuller to call him and clarify. Thanks for letting me know Richardson Landry ----- Message ----- From: Yetta Flock, MD Sent: 02/21/2020   6:07 PM EDT To: [name], MD Subject: mutual patient                                 Candice Camp, Saw this mutual patient today.  He mentioned to me in passing that he stopped his Xarelto, unclear why, it would seem to me that you would want him to continue on this.  I told him I would reach out to you to clarify.  We will have him resume it if that is what your intention is.  Thanks hope you are well.  Richardson Landry

## 2020-02-25 NOTE — Telephone Encounter (Signed)
Confirmed with the patient he should be taking Xarelto. He stated he restarted yesterday. He reports some worsening SOB on exertion but blames weight gain during Covid. Scheduled him for earlier follow-up with Richardson Dopp in December.  He understands to call if symptoms worsen prior to visit.

## 2020-03-04 ENCOUNTER — Encounter: Payer: Self-pay | Admitting: Internal Medicine

## 2020-03-04 NOTE — Progress Notes (Signed)
Patient seen by his nephrologist Dr. Candiss Norse on 02/12/2020.  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  Baseline creatinine has been around 0.8-2.2.  Keeping his blood pressure under control and keeping him relatively euvolemic will BP and slowing down the progression of his kidney disease. Urinalysis showed 2+ protein, BUN is 29, creatinine 2.09, GFR 34. 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

## 2020-03-18 ENCOUNTER — Other Ambulatory Visit: Payer: Self-pay | Admitting: Cardiovascular Disease

## 2020-03-18 NOTE — Telephone Encounter (Signed)
Pt last saw Dr Burt Knack 12/11/19, last labs 01/13/20 Creat 1.87, age 59, weight 130.6kg, CrCl 80.51, based on CrCl pt is on appropriate dosage of Xarelto 20mg  QD.  Will refill rx.

## 2020-03-19 ENCOUNTER — Other Ambulatory Visit: Payer: Self-pay | Admitting: Internal Medicine

## 2020-04-06 ENCOUNTER — Other Ambulatory Visit: Payer: Self-pay

## 2020-04-06 MED ORDER — ENTECAVIR 0.5 MG PO TABS
0.5000 mg | ORAL_TABLET | ORAL | 1 refills | Status: DC
Start: 2020-04-06 — End: 2020-10-05

## 2020-04-06 NOTE — Progress Notes (Signed)
REFILL for Entecavir sent to pharmacy.

## 2020-04-09 ENCOUNTER — Other Ambulatory Visit: Payer: Self-pay | Admitting: Cardiovascular Disease

## 2020-04-16 DIAGNOSIS — F331 Major depressive disorder, recurrent, moderate: Secondary | ICD-10-CM | POA: Diagnosis not present

## 2020-04-16 DIAGNOSIS — F410 Panic disorder [episodic paroxysmal anxiety] without agoraphobia: Secondary | ICD-10-CM | POA: Diagnosis not present

## 2020-04-21 ENCOUNTER — Ambulatory Visit: Payer: Medicaid Other | Admitting: Physician Assistant

## 2020-04-28 ENCOUNTER — Other Ambulatory Visit: Payer: Self-pay

## 2020-04-28 ENCOUNTER — Ambulatory Visit: Payer: Medicaid Other | Admitting: Internal Medicine

## 2020-04-28 VITALS — BP 146/88 | HR 69 | Ht 75.0 in | Wt 289.2 lb

## 2020-04-28 DIAGNOSIS — I480 Paroxysmal atrial fibrillation: Secondary | ICD-10-CM

## 2020-04-28 DIAGNOSIS — Z95810 Presence of automatic (implantable) cardiac defibrillator: Secondary | ICD-10-CM

## 2020-04-28 DIAGNOSIS — I5022 Chronic systolic (congestive) heart failure: Secondary | ICD-10-CM | POA: Diagnosis not present

## 2020-04-28 NOTE — Patient Instructions (Addendum)
Medication Instructions:  Your physician recommends that you continue on your current medications as directed. Please refer to the Current Medication list given to you today.  Labwork: None ordered.  Testing/Procedures: None ordered.  Follow-Up:  Your physician wants you to follow-up in: 6 months with the device clinic.  October 29, 2020 at 2:00 pm at the [facility] office  Your physician wants you to follow-up in: one year with Dr. Lovena Le.   You will receive a reminder letter in the mail two months in advance. If you don't receive a letter, please call our office to schedule the follow-up appointment.  Any Other Special Instructions Will Be Listed Below (If Applicable).  If you need a refill on your cardiac medications before your next appointment, please call your pharmacy.

## 2020-04-28 NOTE — Progress Notes (Signed)
HPI [name] returns today for followup. He is a pleasant 59 yo man with a h/o chronic systolic heart failure, s/p ICD insertion who underwent ICD gen change out several months ago. He has a h/o Hep B with normal LFT's. He has been on amiodarone for atrial fib for which he is very symptomatic. Fortunately he has maintained NSR. He dropped some aluminum on his leg and has a sore which is slowly healing. Allergies  Allergen Reactions  . Codeine Itching, Nausea And Vomiting and Hives  . Hydrocodone-Acetaminophen Itching and Nausea And Vomiting     Current Outpatient Medications  Medication Sig Dispense Refill  . albuterol (VENTOLIN HFA) 108 (90 Base) MCG/ACT inhaler INHALE 2 PUFFS INTO THE LUNGS EVERY 6 HOURS AS NEEDED FOR WHEEZING OR SHORTNESS OF BREATH 8.5 g 6  . alprazolam (XANAX) 2 MG tablet Take 2 mg by mouth every 6 (six) hours as needed for anxiety.    Marland Kitchen amiodarone (PACERONE) 200 MG tablet Take one tablet by mouth daily Monday through Saturday.  Do NOT take on Sunday. (Patient taking differently: Take 200 mg by mouth See admin instructions. Take 200 mg mouth daily Monday through Saturday.  Do NOT take on Sunday.) 90 tablet 3  . amLODipine (NORVASC) 10 MG tablet TAKE 1 TABLET BY MOUTH EVERY DAY (Patient taking differently: Take 10 mg by mouth daily.) 30 tablet 6  . budesonide-formoterol (SYMBICORT) 160-4.5 MCG/ACT inhaler Inhale 2 puffs into the lungs 2 (two) times daily. 10.2 g 6  . carvedilol (COREG) 12.5 MG tablet TAKE 1 TABLET(12.5 MG) BY MOUTH TWICE DAILY WITH A MEAL 180 tablet 3  . cycloSPORINE (RESTASIS) 0.05 % ophthalmic emulsion Place 1 drop into both eyes 2 (two) times daily.    Marland Kitchen Dextran 70-Hypromellose, PF, (TEARS NATURALE FREE) 0.1-0.3 % SOLN Place 1 drop into both eyes daily as needed (Dry eyes).    . DULoxetine (CYMBALTA) 30 MG capsule Take 90 mg by mouth daily.     Marland Kitchen entecavir (BARACLUDE) 0.5 MG tablet Take 1 tablet (0.5 mg total) by mouth every other day. Please  note: Every OTHER day 45 tablet 1  . ezetimibe (ZETIA) 10 MG tablet TAKE 1 TABLET(10 MG) BY MOUTH DAILY (Patient taking differently: Take 10 mg by mouth daily.) 30 tablet 5  . ferrous sulfate 325 (65 FE) MG tablet Take 325 mg by mouth daily with breakfast.    . fluticasone (FLONASE) 50 MCG/ACT nasal spray SHAKE LIQUID AND USE 1 SPRAY IN EACH NOSTRIL DAILY 16 g 2  . furosemide (LASIX) 40 MG tablet Take 1 tablet (40 mg total) by mouth daily. 90 tablet 3  . Olopatadine HCl 0.2 % SOLN Apply 1 drop to eye daily.    . ondansetron (ZOFRAN-ODT) 8 MG disintegrating tablet Take 8 mg by mouth 2 (two) times daily as needed.    Marland Kitchen oxybutynin (DITROPAN) 5 MG tablet Take 5 mg by mouth 3 (three) times daily.    . phenylephrine-shark liver oil-mineral oil-petrolatum (HEMORRHOIDAL) 0.25-14-74.9 % rectal ointment Place 1 application rectally 2 (two) times daily as needed for hemorrhoids.    . polyethylene glycol (MIRALAX) 17 g packet Take as directed, Daily, titrate as needed 14 each 0  . sildenafil (REVATIO) 20 MG tablet Take 1 tablet (20 mg total) by mouth daily as needed (Take 2-5 tablets once daily as needed prior to sexual activity.). 100 tablet 3  . tamsulosin (FLOMAX) 0.4 MG CAPS capsule TAKE 1 CAPSULE(0.4 MG) BY MOUTH DAILY (Patient taking  differently: Take 0.4 mg by mouth in the morning and at bedtime.) 30 capsule 2  . XARELTO 20 MG TABS tablet TAKE 1 TABLET(20 MG) BY MOUTH DAILY WITH SUPPER 90 tablet 1  . hydrALAZINE (APRESOLINE) 50 MG tablet Take 1 tablet (50 mg total) by mouth 2 (two) times daily. 180 tablet 3   No current facility-administered medications for this visit.     Past Medical History:  Diagnosis Date  . Anxiety   . Arthritis   . Benzodiazepine dependence ([place])   . Bronchial asthma   . CAD (coronary artery disease)    a. Cath 03/2010: mod RCA stenosis, severe diag stenosis, treated medically given lack of angina.  . 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 Chronic systolic  congestive heart failure (HCC)    Acute decompensation, LVEF less than 20%  . 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 CKD (chronic kidney disease), stage IV (HCC)    Stage 3-4  . Depression   . Diverticulosis   . Hepatitis B   . History of cocaine abuse ([place])   . Hypertension   . Microcytic anemia   . PAF (paroxysmal atrial fibrillation) ([place])    a. Episode in 2008 with spontaneous conversion  . S/P implantation of automatic cardioverter/defibrillator (AICD)    a. Medtronic, implanted 2012.  . Sleep apnea   . Stroke [facility]) 2004  . Thyroid disease     ROS:   All systems reviewed and negative except as noted in the HPI.   Past Surgical History:  Procedure Laterality Date  . CARDIOVERSION N/A 01/07/2014   Procedure: CARDIOVERSION;  Surgeon: Lelon Perla, MD;  Location: [facility] ENDOSCOPY;  Service: Cardiovascular;  Laterality: N/A;  . CARDIOVERSION N/A 02/11/2014   Procedure: CARDIOVERSION;  Surgeon: Pixie Casino, MD;  Location: [place];  Service: Cardiovascular;  Laterality: N/A;  . COLONOSCOPY WITH PROPOFOL N/A 02/06/2018   Procedure: COLONOSCOPY WITH PROPOFOL;  Surgeon: Yetta Flock, MD;  Location: [facility] ENDOSCOPY;  Service: Gastroenterology;  Laterality: N/A;  . ICD GENERATOR CHANGEOUT N/A 01/17/2020   Procedure: [place];  Surgeon: Evans Lance, MD;  Location: [place] CV LAB;  Service: Cardiovascular;  Laterality: N/A;  . LASEK eye surgery Bilateral 05/2018  . LASIK Bilateral   . PACEMAKER INSERTION    . POLYPECTOMY  02/06/2018   Procedure: POLYPECTOMY;  Surgeon: Yetta Flock, MD;  Location: [facility] ENDOSCOPY;  Service: Gastroenterology;;  . TEE WITHOUT CARDIOVERSION N/A 01/07/2014   Procedure: TRANSESOPHAGEAL ECHOCARDIOGRAM (TEE);  Surgeon: Lelon Perla, MD;  Location: [facility] ENDOSCOPY;  Service: Cardiovascular;  Laterality: N/A;  . TEE WITHOUT CARDIOVERSION N/A 02/11/2014   Procedure: TRANSESOPHAGEAL ECHOCARDIOGRAM (TEE);  Surgeon:  Pixie Casino, MD;  Location: [facility] ENDOSCOPY;  Service: Cardiovascular;  Laterality: N/A;  . TONSILLECTOMY    . TRANSTHORACIC ECHOCARDIOGRAM  10/2006, 12/2009     Family History  Problem Relation Age of Onset  . Breast cancer Mother   . Multiple myeloma Mother   . Prostate cancer Father   . Diabetes Father   . Peripheral vascular disease Father   . Cerebral palsy Daughter   . Lung disease Neg Hx   . Colon cancer Neg Hx   . Stomach cancer Neg Hx   . Esophageal cancer Neg Hx   . Colon polyps Neg Hx      Social History   Socioeconomic History  . Marital status: Divorced    Spouse name: Not on file  .  Number of children: 2  . Years of education: Not on file  . Highest education level: Not on file  Occupational History  . Occupation: Unemployed    Employer: UNEMPLOYED    Comment: basketball referee in the past  Tobacco Use  . Smoking status: Former Smoker    Packs/day: 0.50    Years: 0.50    Pack years: 0.25    Types: Cigarettes    Quit date: 05/02/2010    Years since quitting: 9.9  . Smokeless tobacco: Never Used  . Tobacco comment: Significant Second-hand and only 3 months himself  Vaping Use  . Vaping Use: Never used  Substance and Sexual Activity  . Alcohol use: Not Currently    Alcohol/week: 0.0 standard drinks    Comment: rare  . Drug use: No    Comment: Reported history of cocaine abuse off and on   . Sexual activity: Not on file  Other Topics Concern  . Not on file  Social History Narrative   Living with a son who is a 21-year-old.  He is not     working, unemployed for 3 years.  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  Denied use of alcohol.  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       [facility]:   Originally from [place], [place]. He grew up in [place]. He moved to [place] in 1985. 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 No pets currently. No bird exposure. He did have  mold in a previous home. No hot tub exposure.         Social Determinants of Health   Financial Resource Strain: Not on file  Food Insecurity: Not on file  Transportation Needs: Not on file  Physical Activity: Not on file  Stress: Not on file  Social Connections: Not on file  Intimate Partner Violence: Not on file     BP (!) 146/88   Pulse 69   Ht 6' 3" (1.905 m)   Wt 289 lb 3.2 oz (131.2 kg)   SpO2 96%   BMI 36.15 kg/m   Physical Exam:  Well appearing NAD HEENT: Unremarkable Neck:  No JVD, no thyromegally Lymphatics:  No adenopathy Back:  No CVA tenderness Lungs:  Clear HEART:  Regular rate rhythm, no murmurs, no rubs, no clicks Abd:  soft, positive bowel sounds, no organomegally, no rebound, no guarding Ext:  2 plus pulses, no edema, no cyanosis, no clubbing Skin:  No rashes no nodules Neuro:  CN II through XII intact, motor grossly intact  EKG - NSR  DEVICE  Normal device function.  See PaceArt for details.   Assess/Plan: 1. Chronic systolic heart failure - his symptoms are class 2. He is encouraged to avoid salty foods, lose weight and maintain a low fat diet. 2. Obesity - he had been down to 250 and I encouraged him to get back to work to lose weight. 3. Atrial fib - he is maintaining NSR. I have asked him to continue his amiodarone.  4. ICD - his medtronic single chamber ICD is working normally.   ,MD  

## 2020-04-29 ENCOUNTER — Other Ambulatory Visit: Payer: Self-pay | Admitting: Cardiovascular Disease

## 2020-04-29 LAB — CUP PACEART INCLINIC DEVICE CHECK
Battery Remaining Longevity: 136 mo
Battery Voltage: 3.14 V
Brady Statistic RV Percent Paced: 0.18 %
Date Time Interrogation Session: 20211228150800
HighPow Impedance: 51 Ohm
HighPow Impedance: 68 Ohm
Implantable Lead Implant Date: 20120806
Implantable Lead Location: 753860
Implantable Lead Model: 185
Implantable Lead Serial Number: 345870
Implantable Pulse Generator Implant Date: 20210917
Lead Channel Impedance Value: 323 Ohm
Lead Channel Impedance Value: 323 Ohm
Lead Channel Pacing Threshold Amplitude: 1.25 V
Lead Channel Pacing Threshold Pulse Width: 0.4 ms
Lead Channel Sensing Intrinsic Amplitude: 10.5 mV
Lead Channel Sensing Intrinsic Amplitude: 6.75 mV
Lead Channel Setting Pacing Amplitude: 3 V
Lead Channel Setting Pacing Pulse Width: 0.4 ms
Lead Channel Setting Sensing Sensitivity: 0.3 mV

## 2020-05-04 ENCOUNTER — Encounter: Payer: Self-pay | Admitting: Physician Assistant

## 2020-05-04 NOTE — Progress Notes (Addendum)
Cardiology Office Note    Date:  05/07/2020   ID:  TAJAE Cody, DOB 1960-07-04, MRN [id number]  PCP:  Ortega Pier, MD  Cardiologist:  [name], MD  Electrophysiologist:  Cristopher Peru, MD   Chief Complaint: f/u CHF  History of Present Illness:   Cody Ortega is a 60 y.o. 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 Havery Moros), anxiety, arthritis, asthma, paroxysmal atrial fibrillation treated with amiodarone, diverticulosis, HTN, stroke, sleep apnea (intolerant of CPAP, uses nose strips), CKD stage IV (followed by Dr. Candiss Norse) and thyroid disease.  He is followed by both Dr. Burt Knack and Dr. Lovena Le with history outlined above. Per notes, it has been very important that he maintain NSR as he did not previously tolerate afib therefore amiodarone felt necessary. Last echo 04/2019 EF 30-35%, global HK, mod-severe LV dilation, elevated LAP, grade 2 DD, severe LAE. At last general cardiology OV 12/2019 he was started on Lasix. He underwent gen change in September 2021. He saw Dr. Lovena Le last week for EP follow-up and no changes were made at that time.  He presents back for follow-up today reporting gradual increase in dyspnea over the past 1-2 years. Pre-Covid he was eating better and exercising regularly but that fell off with the pandemic. He has had gradual weight gain since last year but it has leveled off. He is able to perform ADLs but gets DOE with moderate intensity activity. His son recently moved to a 3rd floor apartment and [name] had to stop and rest going up the stairs. He notes a chronic rattly cough in the morning with clear phlegm that clears as the day goes on - has been present for multiple years without change. No orthopnea or edema. No  chest pain. He has had gradual weight gain since last year. He does think there is a significant component of deconditioning to his dyspnea. His BP is elevated today, and was elevated at EP appointment as well. He reports he sees similar values at home. He remains abstinent of cocaine. 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  Labwork independently reviewed: 01/2020 Cr 1.87, K 3.8, Hgb 18.5, plt 237 12/2019 Cr 2.21, LFTs wnl 07/2018 TSH wnl, LDL 116   Past Medical History:  Diagnosis Date   Anxiety    Arthritis    Benzodiazepine dependence (HCC)    Bronchial asthma    CAD (coronary artery disease)    a. Cath 03/2010: mod RCA stenosis, severe diag stenosis, treated medically given lack of angina.   Cardiomyopathy    possible cocaine induced   Chronic systolic congestive heart failure (HCC)    Cirrhosis (HCC)    secondary to hepatitis B   CKD (chronic kidney disease), stage IV (HCC)    Stage 3-4   Depression    Diverticulosis    Hepatitis B    History of cocaine abuse ([place])    Hypertension    Microcytic anemia    PAF (paroxysmal atrial fibrillation) ([place])    a. Episode in 2008 with spontaneous conversion   S/P implantation of automatic cardioverter/defibrillator (AICD)    a. Medtronic, implanted 2012.   Sleep apnea  Stroke [facility]) 2004   Thyroid disease     Past Surgical History:  Procedure Laterality Date   CARDIOVERSION N/A 01/07/2014   Procedure: CARDIOVERSION;  Surgeon: Ortega Perla, MD;  Location: [facility] ENDOSCOPY;  Service: Cardiovascular;  Laterality: N/A;   CARDIOVERSION N/A 02/11/2014   Procedure: CARDIOVERSION;  Surgeon: Ortega Casino, MD;  Location: [facility] ENDOSCOPY;  Service: Cardiovascular;  Laterality: N/A;   COLONOSCOPY WITH PROPOFOL N/A 02/06/2018   Procedure: COLONOSCOPY WITH PROPOFOL;  Surgeon: Ortega Flock, MD;  Location: [facility]  ENDOSCOPY;  Service: Gastroenterology;  Laterality: N/A;   ICD GENERATOR CHANGEOUT N/A 01/17/2020   Procedure: ICD GENERATOR CHANGEOUT;  Surgeon: Ortega Lance, MD;  Location: [place] CV LAB;  Service: Cardiovascular;  Laterality: N/A;   LASEK eye surgery Bilateral 05/2018   LASIK Bilateral    PACEMAKER INSERTION     POLYPECTOMY  02/06/2018   Procedure: POLYPECTOMY;  Surgeon: Ortega Flock, MD;  Location: [facility] ENDOSCOPY;  Service: Gastroenterology;;   TEE WITHOUT CARDIOVERSION N/A 01/07/2014   Procedure: TRANSESOPHAGEAL ECHOCARDIOGRAM (TEE);  Surgeon: Ortega Perla, MD;  Location[facility] ENDOSCOPY;  Service: Cardiovascular;  Laterality: N/A;   TEE WITHOUT CARDIOVERSION N/A 02/11/2014   Procedure: TRANSESOPHAGEAL ECHOCARDIOGRAM (TEE);  Surgeon: Ortega Casino, MD;  [facility] ENDOSCOPY;  Service: Cardiovascular;  Laterality: N/A;   TONSILLECTOMY     TRANSTHORACIC ECHOCARDIOGRAM  10/2006, 12/2009    Current Medications: Current Meds  Medication Sig   albuterol (VENTOLIN HFA) 108 (90 Base) MCG/ACT inhaler INHALE 2 PUFFS INTO THE LUNGS EVERY 6 HOURS AS NEEDED FOR WHEEZING OR SHORTNESS OF BREATH   alprazolam (XANAX) 2 MG tablet Take 2 mg by mouth every 6 (six) hours as needed for anxiety.   amLODipine (NORVASC) 10 MG tablet TAKE 1 TABLET BY MOUTH EVERY DAY   budesonide-formoterol (SYMBICORT) 160-4.5 MCG/ACT inhaler Inhale 2 puffs into the lungs 2 (two) times daily.   carvedilol (COREG) 12.5 MG tablet TAKE 1 TABLET(12.5 MG) BY MOUTH TWICE DAILY WITH A MEAL   cycloSPORINE (RESTASIS) 0.05 % ophthalmic emulsion Place 1 drop into both eyes 2 (two) times daily.   Dextran 70-Hypromellose, PF, (TEARS NATURALE FREE) 0.1-0.3 % SOLN Place 1 drop into both eyes daily as needed (Dry eyes).   DULoxetine (CYMBALTA) 30 MG capsule Take 90 mg by mouth daily.    entecavir (BARACLUDE) 0.5 MG tablet Take 1 tablet (0.5 mg total) by mouth every other day. Please note: Every OTHER day    ezetimibe (ZETIA) 10 MG tablet TAKE 1 TABLET(10 MG) BY MOUTH DAILY   ferrous sulfate 325 (65 FE) MG tablet Take 325 mg by mouth daily with breakfast.   fluticasone (FLONASE) 50 MCG/ACT nasal spray SHAKE LIQUID AND USE 1 SPRAY IN EACH NOSTRIL DAILY   furosemide (LASIX) 40 MG tablet Take 1 tablet (40 mg total) by mouth daily.   Olopatadine HCl 0.2 % SOLN Apply 1 drop to eye daily.   ondansetron (ZOFRAN-ODT) 8 MG disintegrating tablet Take 8 mg by mouth 2 (two) times daily as needed.   oxybutynin (DITROPAN) 5 MG tablet Take 5 mg by mouth 3 (three) times daily.   polyethylene glycol (MIRALAX) 17 g packet Take as directed, Daily, titrate as needed   tamsulosin (FLOMAX) 0.4 MG CAPS capsule Take 0.4 mg by mouth 2 (two) times daily.   XARELTO 20 MG TABS tablet TAKE 1 TABLET(20 MG) BY MOUTH DAILY WITH SUPPER   [DISCONTINUED] amiodarone (PACERONE) 200 MG tablet Take one tablet by mouth daily Monday through  Saturday.  Do NOT take on Sunday. (Patient taking differently: Take one tablet by mouth daily Monday through Saturday.  Do NOT take on Sunday.)   [DISCONTINUED] phenylephrine-shark liver oil-mineral oil-petrolatum (HEMORRHOIDAL) 0.25-14-74.9 % rectal ointment Place 1 application rectally 2 (two) times daily as needed for hemorrhoids.   [DISCONTINUED] sildenafil (REVATIO) 20 MG tablet Take 1 tablet (20 mg total) by mouth daily as needed (Take 2-5 tablets once daily as needed prior to sexual activity.).   [DISCONTINUED] tamsulosin (FLOMAX) 0.4 MG CAPS capsule TAKE 1 CAPSULE(0.4 MG) BY MOUTH DAILY (Patient taking differently: Take 0.4 mg by mouth in the morning and at bedtime.)      Allergies:   Codeine and Hydrocodone-acetaminophen   Social History   Socioeconomic History   Marital status: Divorced    Spouse name: Not on file   Number of children: 2   Years of education: Not on file   Highest education level: Not on file  Occupational History   Occupation: Unemployed    Employer:  UNEMPLOYED    Comment: Writer in the past  Tobacco Use   Smoking status: Former Smoker    Packs/day: 0.50    Years: 0.50    Pack years: 0.25    Types: Cigarettes    Quit date: 05/02/2010    Years since quitting: 10.0   Smokeless tobacco: Never Used   Tobacco comment: Significant Second-hand and only 3 months himself  Vaping Use   Vaping Use: Never used  Substance and Sexual Activity   Alcohol use: Not Currently    Alcohol/week: 0.0 standard drinks    Comment: rare   Drug use: No    Comment: Reported history of cocaine abuse off and on    Sexual activity: Not on file  Other Topics Concern   Not on file  Social History Narrative   Living with a son who is a 15 year old.  He is not     working, unemployed for 3 years.  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  Denied use of alcohol.  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       [facility]:   Originally from [place], [place]. He grew up in [place]. He moved to [place] in 1985. 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 No pets currently. No bird exposure. He did have mold in a previous home. No hot tub exposure.         Social Determinants of Health   Financial Resource Strain: Not on file  Food Insecurity: Not on file  Transportation Needs: Not on file  Physical Activity: Not on file  Stress: Not on file  Social Connections: Not on file     Family History:  The patient's family history includes Breast cancer in his mother; Cerebral palsy in his daughter; Diabetes in his father; Multiple myeloma in his mother; Peripheral vascular disease in his father; Prostate cancer in his father. There is no history of Lung disease, Colon cancer, Stomach cancer, Esophageal cancer, or Colon polyps.  ROS:   Please see the history of present illness.   All other systems are reviewed and otherwise negative.    EKGs/Labs/Other Studies Reviewed:     Studies reviewed are outlined and summarized above. Reports included below if pertinent.  Cath scan from 2011  2D echo 04/2019   1. Left ventricular ejection fraction, by visual estimation, is 30 to  35%. The left ventricle has severely decreased function. Apex not  well-visualized. There is moderately increased left ventricular  hypertrophy.  2. The left ventricle demonstrates global hypokinesis.  3. The average left ventricular global longitudinal strain is -9.1 %.  4. Moderate to severely dilated left ventricular internal cavity size.  5. Left ventricular diastolic parameters are consistent with Grade II  diastolic dysfunction (pseudonormalization).  6. Elevated left atrial pressure.  7. Global right ventricle has normal systolic function.The right  ventricular size is normal. No increase in right ventricular wall  thickness.  8. Left atrial size was severely dilated.  9. Right atrial size was normal.  10. Trivial pericardial effusion is present.  11. The mitral valve is normal in structure. No evidence of mitral valve  regurgitation.  12. The tricuspid valve is normal in structure. Tricuspid valve  regurgitation is not demonstrated.  13. The aortic valve is tricuspid. Aortic valve regurgitation is not  visualized. No evidence of aortic valve sclerosis or stenosis.  14. The pulmonic valve was not well visualized. Pulmonic valve  regurgitation is not visualized.     EKG:  EKG is ordered today, personally reviewed, demonstrating NSR 69bpm, baseline wander, borderline 1st degree AVB, nonspecific QRS widening 166ms with nonspecific STT changes  Recent Labs: 12/11/2019: ALT 11 01/13/2020: BUN 20; Creatinine, Ser 1.87; Hemoglobin 18.5; Platelets 237; Potassium 3.8; Sodium 143  Recent Lipid Panel    Component Value Date/Time   CHOL 180 07/09/2018 1527   TRIG 118 07/09/2018 1527   HDL 40 07/09/2018 1527   CHOLHDL 4.5 07/09/2018 1527   CHOLHDL 3.9 10/12/2015 1026    VLDL 27 10/12/2015 1026   LDLCALC 116 (H) 07/09/2018 1527    PHYSICAL EXAM:    VS:  BP 140/80    Pulse 62    Ht $R'6\' 3"'YJ$  (1.905 m)    Wt 290 lb (131.5 kg)    SpO2 96%    BMI 36.25 kg/m   BMI: Body mass index is 36.25 kg/m.  GEN: Well nourished, well developed obese WM in no acute distress HEENT: normocephalic, atraumatic Neck: no JVD, carotid bruits, or masses Cardiac: RRR; no murmurs, rubs, or gallops, no edema, 2+ pedal pulses Respiratory: mildly diminished throughout without wheezes, rales or rhonchi, normal work of breathing GI: soft, nontender, nondistended, + BS MS: no deformity or atrophy Skin: warm and dry Neuro:  Alert and Oriented x 3, Strength and sensation are intact, follows commands Psych: euthymic mood, full affect  Wt Readings from Last 3 Encounters:  05/07/20 290 lb (131.5 kg)  04/28/20 289 lb 3.2 oz (131.2 kg)  02/21/20 288 lb (130.6 kg)     ASSESSMENT & PLAN:   1. Shortness of breath - suspect multifactorial in the context of deconditioning and weight gain, but also has compounding factors of heart failure, asthma and chronic amiodarone use. I will go ahead and order labs today to include CMET, CBC, and pBNP. The patient wants to wait until our phlebotomist Lanny Hurst is in the office to have these drawn due to his fear of needles. Will also update 2V CXR given amiodarone use. There is room to further optimize Mr. Mondo medication regimen from a cardiac standpoint, albeit somewhat limited by his renal disease. I will plan to review further with Dr. Burt Knack tomorrow when he is in the office. The patient does plan to engage in a regular exercise program to build endurance which will hopefully help sort out pathology from baseline inactivity.  2. Chronic systolic CHF/NICM -  reports class II-III symptoms. Does not sound like he has had a dramatic change in symptoms so much as smoldering progressive DOE, complicated by general lack of regular physical activity. Weight has been  generally unreliable to track as he has gained body weight over the last year. He is regularly maintained on amlodipine, carvedilol, hydralazine BID, and Lasix. We will obtain labs above. Consideration could be given to titrating hydralazine or carvedilol further to assist with improved blood pressure control/HF optimization - will plan to discuss with Dr. Burt Knack as above. Would avoid nitrates given sildenafil use. Not presently on ACEi/ARB/spiro due to CKD. Will need to share lab results with his nephrologist when they come back as well. Addendum: hydralazine noted to have fallen off list when note populated due to expired order - will have triage call him today to double check he is still taking this. I talked with Dr. Burt Knack this AM and he's been on  BID dosing since he would be unlikely to take a TID med. Per our discussion we'll await his labs and go from there.  3. CAD - not on aspirin as he is also on Xarelto, Continue BB. He is not on statin per chart review due to active liver disease. Has been instead maintained on Zetia. Will check LFTs with CMET when he returns for labs.  4. Persistent atrial fibrillation with history of amiodarone therapy - maintaining NSR. Amiodarone therapy is managed by Dr. Lovena Le. He has not had recent thyroid studies or CXR. Will obtain. He is due for repeat LFTs next month given amiodarone use. However, given his fear of needles and desire to limit amount of draws, will obtain when he returns for labs above to minimize sticks. Last CrCl is 40ml/min, making Xarelto dose still appropriate. This should be reviewed at each OV.  5. Essential HTN - elevated above goal. Check labs today and will plan to manage in context of the above.  6. ED - refill sildenafil today at prior dosing per patient request. Nitrate precautions reviewed.  Disposition: F/u with Dr. Burt Knack in 6 months, sooner if med changes anticipated per our discussion above.  Medication Adjustments/Labs and Tests  Ordered: Current medicines are reviewed at length with the patient today.  Concerns regarding medicines are outlined above. Medication changes, Labs and Tests ordered today are summarized above and listed in the Patient Instructions accessible in Encounters.   Signed, Charlie Pitter, PA-C  05/07/2020 1:14 PM    [facility] [place], [place], [place]  [address] Phone: [phone]; Fax: [phone]

## 2020-05-07 ENCOUNTER — Ambulatory Visit
Admission: RE | Admit: 2020-05-07 | Discharge: 2020-05-07 | Disposition: A | Discharge status: Home / Self Care | Payer: Medicaid Other | Source: Ambulatory Visit | Attending: Physician Assistant | Admitting: Physician Assistant

## 2020-05-07 ENCOUNTER — Encounter: Payer: Self-pay | Admitting: Physician Assistant

## 2020-05-07 ENCOUNTER — Other Ambulatory Visit: Payer: Self-pay

## 2020-05-07 ENCOUNTER — Ambulatory Visit (INDEPENDENT_AMBULATORY_CARE_PROVIDER_SITE_OTHER): Payer: Medicaid Other | Admitting: Physician Assistant

## 2020-05-07 VITALS — BP 140/80 | HR 62 | Ht 75.0 in | Wt 290.0 lb

## 2020-05-07 DIAGNOSIS — I4819 Other persistent atrial fibrillation: Secondary | ICD-10-CM

## 2020-05-07 DIAGNOSIS — R0602 Shortness of breath: Secondary | ICD-10-CM

## 2020-05-07 DIAGNOSIS — I10 Essential (primary) hypertension: Secondary | ICD-10-CM | POA: Diagnosis not present

## 2020-05-07 DIAGNOSIS — I5022 Chronic systolic (congestive) heart failure: Secondary | ICD-10-CM

## 2020-05-07 DIAGNOSIS — I428 Other cardiomyopathies: Secondary | ICD-10-CM

## 2020-05-07 DIAGNOSIS — N529 Male erectile dysfunction, unspecified: Secondary | ICD-10-CM

## 2020-05-07 DIAGNOSIS — Z9229 Personal history of other drug therapy: Secondary | ICD-10-CM

## 2020-05-07 DIAGNOSIS — I2510 Atherosclerotic heart disease of native coronary artery without angina pectoris: Secondary | ICD-10-CM | POA: Diagnosis not present

## 2020-05-07 MED ORDER — AMIODARONE HCL 200 MG PO TABS: ORAL_TABLET | ORAL | 3 refills | Status: DC

## 2020-05-07 MED ORDER — SILDENAFIL CITRATE 20 MG PO TABS: ORAL_TABLET | ORAL | 3 refills | Status: DC

## 2020-05-07 NOTE — Patient Instructions (Addendum)
Medication Instructions:  Your physician recommends that you continue on your current medications as directed. Please refer to the Current Medication list given to you today.  *If you need a refill on your cardiac medications before your next appointment, please call your pharmacy*   Lab Work: TODAY:  CMET, PRO BNP, CBC, TSH, & FT4  If you have labs (blood work) drawn today and your tests are completely normal, you will receive your results only by: Marland Kitchen MyChart Message (if you have MyChart) OR . A paper copy in the mail If you have any lab test that is abnormal or we need to change your treatment, we will call you to review the results.   Testing/Procedures: A chest x-ray takes a picture of the organs and structures inside the chest, including the heart, lungs, and blood vessels. This test can show several things, including, whether the heart is enlarges; whether fluid is building up in the lungs; and whether pacemaker / defibrillator leads are still in place. Go to [address] [place] [address] [phone]   Follow-Up: At [facility], you and your health needs are our priority.  As part of our continuing mission to provide you with exceptional heart care, we have created designated Provider Care Teams.  These Care Teams include your primary Cardiologist (physician) and Advanced Practice Providers (APPs -  Physician Assistants and Nurse Practitioners) who all work together to provide you with the care you need, when you need it.  We recommend signing up for the patient portal called "MyChart".  Sign up information is provided on this After Visit Summary.  MyChart is used to connect with patients for Virtual Visits (Telemedicine).  Patients are able to view lab/test results, encounter notes, upcoming appointments, etc.  Non-urgent messages can be sent to your provider as well.   To learn more about what you can do with MyChart, go to [email or web address].     Your next appointment:   6 month(s)  The format for your next appointment:   In Person  Provider:   You may see [name], MD or one of the following Advanced Practice Providers on your designated Care Team:    Richardson Dopp, PA-C  Robbie Lis, [place]    Other Instructions

## 2020-05-08 ENCOUNTER — Telehealth: Payer: Self-pay | Admitting: Physician Assistant

## 2020-05-08 ENCOUNTER — Other Ambulatory Visit: Payer: Medicaid Other | Admitting: *Deleted

## 2020-05-08 DIAGNOSIS — I2510 Atherosclerotic heart disease of native coronary artery without angina pectoris: Secondary | ICD-10-CM | POA: Diagnosis not present

## 2020-05-08 DIAGNOSIS — I10 Essential (primary) hypertension: Secondary | ICD-10-CM

## 2020-05-08 DIAGNOSIS — N529 Male erectile dysfunction, unspecified: Secondary | ICD-10-CM | POA: Diagnosis not present

## 2020-05-08 DIAGNOSIS — I4819 Other persistent atrial fibrillation: Secondary | ICD-10-CM | POA: Diagnosis not present

## 2020-05-08 DIAGNOSIS — Z9229 Personal history of other drug therapy: Secondary | ICD-10-CM | POA: Diagnosis not present

## 2020-05-08 DIAGNOSIS — I5022 Chronic systolic (congestive) heart failure: Secondary | ICD-10-CM | POA: Diagnosis not present

## 2020-05-08 DIAGNOSIS — R0602 Shortness of breath: Secondary | ICD-10-CM | POA: Diagnosis not present

## 2020-05-08 DIAGNOSIS — I428 Other cardiomyopathies: Secondary | ICD-10-CM | POA: Diagnosis not present

## 2020-05-08 NOTE — Telephone Encounter (Signed)
Attempted phone call to pt.  Left voicemail message to contact triage RN at [phone].  

## 2020-05-08 NOTE — Telephone Encounter (Addendum)
   Pt seen in office yesterday. CXR performed without acute findings, maybe some pulmonary vascular congestion but prefer to await labs before we decide next steps per discussion with Dr. Burt Knack. Of note, his paper sheet yesterday indicated he was on hydralazine but it looks like it didn't cross over to our records because it was listed as expired. Can you clarify if he is still on hydralazine? Does he need any refills of any medicines? There were two marked yesterday fro refill but these had just been sent in in December. Please clarify. Thx. We did send in refill of amiodarone yesterday.  Addendum in case patient calls back: Labs reviewed, nothing dramatically different, fluid marker slightly elevated -> will be discussing further recs with Dr. Burt Knack.  Tiffiny Worthy PA-C

## 2020-05-09 LAB — TSH: TSH: 1.95 u[IU]/mL (ref 0.450–4.500)

## 2020-05-09 LAB — CBC
Hematocrit: 51.7 % — ABNORMAL HIGH (ref 37.5–51.0)
Hemoglobin: 18.1 g/dL — ABNORMAL HIGH (ref 13.0–17.7)
MCH: 31.4 pg (ref 26.6–33.0)
MCHC: 35 g/dL (ref 31.5–35.7)
MCV: 90 fL (ref 79–97)
Platelets: 214 10*3/uL (ref 150–450)
RBC: 5.76 x10E6/uL (ref 4.14–5.80)
RDW: 13.4 % (ref 11.6–15.4)
WBC: 9 10*3/uL (ref 3.4–10.8)

## 2020-05-09 LAB — COMPREHENSIVE METABOLIC PANEL
ALT: 11 IU/L (ref 0–44)
AST: 12 IU/L (ref 0–40)
Albumin/Globulin Ratio: 1.4 (ref 1.2–2.2)
Albumin: 4.1 g/dL (ref 3.8–4.9)
Alkaline Phosphatase: 79 IU/L (ref 44–121)
BUN/Creatinine Ratio: 11 (ref 9–20)
BUN: 23 mg/dL (ref 6–24)
Bilirubin Total: 0.6 mg/dL (ref 0.0–1.2)
CO2: 28 mmol/L (ref 20–29)
Calcium: 9.6 mg/dL (ref 8.7–10.2)
Chloride: 101 mmol/L (ref 96–106)
Creatinine, Ser: 2.08 mg/dL — ABNORMAL HIGH (ref 0.76–1.27)
GFR calc Af Amer: 40 mL/min/{1.73_m2} — ABNORMAL LOW (ref >59)
GFR calc non Af Amer: 34 mL/min/{1.73_m2} — ABNORMAL LOW (ref >59)
Globulin, Total: 3 g/dL (ref 1.5–4.5)
Glucose: 91 mg/dL (ref 65–99)
Potassium: 3.6 mmol/L (ref 3.5–5.2)
Sodium: 142 mmol/L (ref 134–144)
Total Protein: 7.1 g/dL (ref 6.0–8.5)

## 2020-05-09 LAB — LIPID PANEL
Chol/HDL Ratio: 5.9 ratio — ABNORMAL HIGH (ref 0.0–5.0)
Cholesterol, Total: 196 mg/dL (ref 100–199)
HDL: 33 mg/dL — ABNORMAL LOW (ref >39)
LDL Chol Calc (NIH): 138 mg/dL — ABNORMAL HIGH (ref 0–99)
Triglycerides: 135 mg/dL (ref 0–149)
VLDL Cholesterol Cal: 25 mg/dL (ref 5–40)

## 2020-05-09 LAB — PRO B NATRIURETIC PEPTIDE: NT-Pro BNP: 1120 pg/mL — ABNORMAL HIGH (ref 0–210)

## 2020-05-09 LAB — T4, FREE: Free T4: 1.57 ng/dL (ref 0.82–1.77)

## 2020-05-12 ENCOUNTER — Other Ambulatory Visit: Payer: Self-pay | Admitting: *Deleted

## 2020-05-12 NOTE — Telephone Encounter (Signed)
Attempted phone call to pt and left voicemail message to contact any triage RN at [phone].

## 2020-05-12 NOTE — Telephone Encounter (Signed)
Spoke with pt regarding lab results below. He will call back.

## 2020-05-12 NOTE — Telephone Encounter (Signed)
-----   Message from Charlie Pitter, [place] sent at 05/12/2020  4:00 PM EST ----- Labs reviewed with Dr. Burt Knack given very complex patient. Kidney function elevated but similar to prior. BNP is slightly elevated possibly suggestive of fluid overload but hard to know since this can be elevated in kidney dysfunction as well. Per discussion with Dr. Burt Knack, please increase Lasix from 40mg  to 80 mg daily x 5 days, add KDur 20 meq daily while on high dose Lasix only, then go back down to Lasix 40 mg daily (without potassium) and start Entresto 24/26 mg BID. Recommend BMET 1 week after starting Entresto, with clinic in person follow-up in 3-4 weeks. Please also arrange an updated 2D echocardiogram to look at his heart pump function.  See also phone note where our team tried to reach out to him about his hydralazine.  Other findings: - LDL is elevated, is he taking his ezetimibe? If so, need to consider lipid clinic referral but let's work on CHF med adjustment first to avoid confusion over any potential med effects - Needs to f/u with primary care for elevated hemoglobin level - Documentation sake only: CrCl 72 so anticoag dose remains appropriately  Melina Copa PA-C

## 2020-05-12 NOTE — Telephone Encounter (Signed)
When pt returns call, please see also reference lab result note from recent [place] - sent to J Witty.

## 2020-05-13 NOTE — Telephone Encounter (Signed)
Spoke with pt who reports he is taking Hydralazine 50mg  twice daily.  He was unable to say if he needed refills or not as he was currently driving.  Pt states he is unable to talk and results, recommendations and appointments may be left on his voicemail.  Will forward information to [employer], PA-C.

## 2020-05-14 NOTE — Telephone Encounter (Signed)
FYI for results from lab result note. Likely still need to receive these instructions. Please make sure hydralazine makes it back to his med list officially - had fallen off as expired. Thx!

## 2020-05-20 NOTE — Telephone Encounter (Signed)
Call placed to pt to go over lab results and medication change recommendations. Left another message for pt to call back.

## 2020-05-26 ENCOUNTER — Telehealth: Payer: Self-pay | Admitting: *Deleted

## 2020-05-26 ENCOUNTER — Other Ambulatory Visit: Payer: Self-pay | Admitting: Cardiovascular Disease

## 2020-05-26 DIAGNOSIS — R0602 Shortness of breath: Secondary | ICD-10-CM

## 2020-05-26 DIAGNOSIS — I5022 Chronic systolic (congestive) heart failure: Secondary | ICD-10-CM

## 2020-05-26 DIAGNOSIS — I428 Other cardiomyopathies: Secondary | ICD-10-CM

## 2020-05-26 MED ORDER — POTASSIUM CHLORIDE CRYS ER 20 MEQ PO TBCR: EXTENDED_RELEASE_TABLET | ORAL | 0 refills | Status: DC

## 2020-05-26 MED ORDER — FUROSEMIDE 40 MG PO TABS: ORAL_TABLET | ORAL | 3 refills | Status: DC

## 2020-05-26 MED ORDER — ENTRESTO 24-26 MG PO TABS: 1.0000 | ORAL_TABLET | Freq: Two times a day (BID) | ORAL | 0 refills | Status: DC

## 2020-05-26 NOTE — Addendum Note (Signed)
Addended by: Gaetano Net on: 05/26/2020 02:57 PM   Modules accepted: Orders

## 2020-05-26 NOTE — Telephone Encounter (Signed)
Patient returning Jennifer's call, connected call to [facility].

## 2020-05-26 NOTE — Telephone Encounter (Signed)
-----   Message from Charlie Pitter, [place] sent at 05/12/2020  4:00 PM EST ----- Labs reviewed with Dr. Burt Knack given very complex patient. Kidney function elevated but similar to prior. BNP is slightly elevated possibly suggestive of fluid overload but hard to know since this can be elevated in kidney dysfunction as well. Per discussion with Dr. Burt Knack, please increase Lasix from 40mg  to 80 mg daily x 5 days, add KDur 20 meq daily while on high dose Lasix only, then go back down to Lasix 40 mg daily (without potassium) and start Entresto 24/26 mg BID. Recommend BMET 1 week after starting Entresto, with clinic in person follow-up in 3-4 weeks. Please also arrange an updated 2D echocardiogram to look at his heart pump function.  See also phone note where our team tried to reach out to him about his hydralazine.  Other findings: - LDL is elevated, is he taking his ezetimibe? If so, need to consider lipid clinic referral but let's work on CHF med adjustment first to avoid confusion over any potential med effects - Needs to f/u with primary care for elevated hemoglobin level - Documentation sake only: CrCl 72 so anticoag dose remains appropriately  Melina Copa PA-C

## 2020-05-26 NOTE — Telephone Encounter (Signed)
Please find out from [place] [facility] or whomever covers prior auths in our office what needs to happen next. Thx

## 2020-05-26 NOTE — Telephone Encounter (Signed)
There was a duplicate phone note open so look on additional phone note.

## 2020-05-26 NOTE — Telephone Encounter (Signed)
Called pt regarding some changes.  Left a message for pt to call back.  Spoke with pt's pharmacy, Amy at [facility], she has cancelled the 30 day supply of K+ and has put a note on the Entresto for pt not to start until Monday 06/01/2020 when he goes back to the 40 mg of Lasix daily.  [name] is requiring a Prior Authorization  Pt is Part D and they don't have a coupon for Part D pt's per Amy.

## 2020-05-29 ENCOUNTER — Other Ambulatory Visit: Payer: Self-pay

## 2020-05-29 ENCOUNTER — Telehealth: Payer: Self-pay

## 2020-05-29 ENCOUNTER — Ambulatory Visit (HOSPITAL_COMMUNITY): Payer: Medicaid Other | Attending: Cardiology

## 2020-05-29 DIAGNOSIS — R0602 Shortness of breath: Secondary | ICD-10-CM | POA: Insufficient documentation

## 2020-05-29 DIAGNOSIS — I5022 Chronic systolic (congestive) heart failure: Secondary | ICD-10-CM | POA: Diagnosis not present

## 2020-05-29 DIAGNOSIS — I428 Other cardiomyopathies: Secondary | ICD-10-CM | POA: Insufficient documentation

## 2020-05-29 LAB — ECHOCARDIOGRAM COMPLETE
Area-P 1/2: 3.24 cm2
S' Lateral: 5.8 cm

## 2020-05-29 NOTE — Telephone Encounter (Signed)
Patient states he is returning a call regarding the PA for his Entresto.

## 2020-05-29 NOTE — Telephone Encounter (Signed)
**Note De-Identified  Obfuscation** Per covermymeds:  Jonette Pesa Key: Verlee Rossetti - PA Case ID: [id number] Outcome: Approved today Request Reference Number: [id number].  ENTRESTO TAB 24-26MG  is approved through 05/29/2021. For further questions, call [employer] at [phone]. Drug: [name] 24-26MG  tablets Form: [employer] Electronic Prior Authorization Form (2017 NCPDP)  I have advised the pt of this approval and I did leave a message on [facility] advising them that this [name] PA has been approved.

## 2020-05-29 NOTE — Telephone Encounter (Signed)
**Note De-Identified  Obfuscation** I started a Entresto PA through covermymeds. Key: Cody Ortega

## 2020-06-03 ENCOUNTER — Other Ambulatory Visit: Payer: Medicaid Other

## 2020-06-03 ENCOUNTER — Other Ambulatory Visit: Payer: Medicaid Other | Admitting: *Deleted

## 2020-06-03 ENCOUNTER — Other Ambulatory Visit: Payer: Self-pay

## 2020-06-03 DIAGNOSIS — I5022 Chronic systolic (congestive) heart failure: Secondary | ICD-10-CM | POA: Diagnosis not present

## 2020-06-03 DIAGNOSIS — I428 Other cardiomyopathies: Secondary | ICD-10-CM

## 2020-06-03 DIAGNOSIS — R0602 Shortness of breath: Secondary | ICD-10-CM

## 2020-06-04 ENCOUNTER — Telehealth: Payer: Self-pay | Admitting: Cardiovascular Disease

## 2020-06-04 DIAGNOSIS — Z79899 Other long term (current) drug therapy: Secondary | ICD-10-CM

## 2020-06-04 LAB — BASIC METABOLIC PANEL
BUN/Creatinine Ratio: 14 (ref 9–20)
BUN: 28 mg/dL — ABNORMAL HIGH (ref 6–24)
CO2: 22 mmol/L (ref 20–29)
Calcium: 10.1 mg/dL (ref 8.7–10.2)
Chloride: 100 mmol/L (ref 96–106)
Creatinine, Ser: 1.99 mg/dL — ABNORMAL HIGH (ref 0.76–1.27)
GFR calc Af Amer: 42 mL/min/{1.73_m2} — ABNORMAL LOW (ref >59)
GFR calc non Af Amer: 36 mL/min/{1.73_m2} — ABNORMAL LOW (ref >59)
Glucose: 112 mg/dL — ABNORMAL HIGH (ref 65–99)
Potassium: 4.3 mmol/L (ref 3.5–5.2)
Sodium: 142 mmol/L (ref 134–144)

## 2020-06-04 NOTE — Telephone Encounter (Signed)
Patient is returning call to discuss lab results. 

## 2020-06-04 NOTE — Telephone Encounter (Signed)
-----   Message from Charlie Pitter, [place] sent at 06/04/2020  8:04 AM EST ----- Please let pt know labs are stable - continue plan as previously recommended. Remember no potassium when he has returned to lower baseline dose of Lasix.

## 2020-06-04 NOTE — Telephone Encounter (Signed)
He requested a detailed voice message if he is unavailable when he call is returned.

## 2020-06-08 ENCOUNTER — Telehealth: Payer: Self-pay

## 2020-06-08 DIAGNOSIS — B181 Chronic viral hepatitis B without delta-agent: Secondary | ICD-10-CM

## 2020-06-08 DIAGNOSIS — K7460 Unspecified cirrhosis of liver: Secondary | ICD-10-CM

## 2020-06-08 NOTE — Telephone Encounter (Signed)
Labs entered.  RUQ U/S scheduled for March 16th

## 2020-06-08 NOTE — Telephone Encounter (Signed)
Labs are entered. Patient scheduled for U/S at [facility] on Wednesday, 3-16 at 9:30am, arr at 9:15am, NPO after midnight.  Letter mailed to patient. Called pt and left a detailed voice mail with all information.

## 2020-06-08 NOTE — Telephone Encounter (Signed)
-----   Message from Roetta Sessions, [place] sent at 02/21/2020  2:49 PM EDT ----- Regarding: due for labs in Feb and U/S in March Due for labs in Feb 2022 for cirrhosis and chronic Hep B  CMET,  INR,  AFP,  HEP B DNA ultraquantitative PCR Hep B E antibody Hep B E antigen  And due for RUQ U/S in MARCH 2022

## 2020-06-08 NOTE — Progress Notes (Signed)
Patient due for labs and RUQ ultrasound for Cirrhosis

## 2020-06-11 ENCOUNTER — Other Ambulatory Visit: Payer: Self-pay

## 2020-06-11 ENCOUNTER — Other Ambulatory Visit: Payer: Medicaid Other

## 2020-06-11 DIAGNOSIS — Z79899 Other long term (current) drug therapy: Secondary | ICD-10-CM | POA: Diagnosis not present

## 2020-06-12 LAB — BASIC METABOLIC PANEL
BUN/Creatinine Ratio: 9 (ref 9–20)
BUN: 21 mg/dL (ref 6–24)
CO2: 23 mmol/L (ref 20–29)
Calcium: 10 mg/dL (ref 8.7–10.2)
Chloride: 100 mmol/L (ref 96–106)
Creatinine, Ser: 2.25 mg/dL — ABNORMAL HIGH (ref 0.76–1.27)
GFR calc Af Amer: 36 mL/min/{1.73_m2} — ABNORMAL LOW (ref >59)
GFR calc non Af Amer: 31 mL/min/{1.73_m2} — ABNORMAL LOW (ref >59)
Glucose: 115 mg/dL — ABNORMAL HIGH (ref 65–99)
Potassium: 4.1 mmol/L (ref 3.5–5.2)
Sodium: 144 mmol/L (ref 134–144)

## 2020-06-18 NOTE — Progress Notes (Signed)
Cardiology Office Note    Date:  06/19/2020   ID:  Cody Ortega, DOB [birth year], MRN [id number]  PCP:  Ladell Pier, MD  Cardiologist:  [name], MD  Electrophysiologist:  Cristopher Peru, MD   Chief Complaint: f/u CHF  History of Present Illness:   Cody Ortega is a 60 y.o. 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 Havery Moros), anxiety, arthritis, asthma, paroxysmal atrial fibrillation treated with amiodarone, diverticulosis, HTN, ED, stroke, sleep apnea (intolerant of CPAP, uses nose strips), CKD stage IV (followed by Dr. Candiss Norse) and thyroid disease who presents to clinic for follow-up.  He is followed by both Dr. Burt Knack and Dr. Lovena Le with history outlined above. Per notes, it has been very important that he maintain NSR as he did not previously tolerate afib therefore amiodarone felt necessary. At general cardiology OV 12/2019 he was started on Lasix. He underwent gen change in September 2021. He saw Dr. Lovena Le last week for EP follow-up and no changes were made at that time. He presented back to the office 05/07/20 for routine followup and reported increasing DOE ever since the pandemic in the context of weight gain and letting diet/activity slip. + Chronic cough, no CP. Reported abstinence of cocaine. 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   During last visit with Melina Copa, a discussion was held with Dr. Burt Knack and it was recommended the patient increase Lasix x 5 days with temporary addition of KCl, then return to previous usual Lasix dosing followed by transition to trial of Entresto. Per our discussion, compliance was previously a  concern (hence why hydralazine is BID rather than TID). The patient initially did not enact these changes at the time they were advised because it was very difficult to get in touch with him. He was finally able to begin to carry out this plan late January. TTE was updated with EF 25-30%, moderate LVH, grade 2 DD, severely dilated LA, read as no significant change from prior. Cr on 06/11/20 was 2.25 (most recent baseline appearing 1.8-2.2) with CrCl 73ml/min.  Patient states he has been taking his medications as prescribed. He has a pill box to keep him organized. Swelling has completely resolved with the 5 days of extra doses of lasix. Continues to have mild DOE, which is chronic. No chest pain, orthopnea, PND. Has some hemorroidal bleeding on xarelto but stopped on it's own. He is planned for comprehensive labs with GI today and requested that we consolidate if possible to avoid getting stuck twice.    Labwork independently reviewed: 06/11/20 K 4.1, Cr 2.25 06/03/20 Cr 1.99 05/2020 LDL 138, normal TSH/Ft4, Hgb 18.1, LFTs wnl  Past Medical History:  Diagnosis Date  . Anxiety   . Arthritis   . Benzodiazepine dependence ([place])   . Bronchial asthma   . CAD (coronary artery disease)    a. Cath 03/2010: mod RCA stenosis, severe diag stenosis, treated medically given lack of angina.  . 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 Chronic systolic congestive heart failure ([place])   . Cirrhosis ([place])    secondary to hepatitis B  . CKD (chronic kidney disease), stage IV ([place])  Stage 3-4  . Depression   . Diverticulosis   . Hepatitis B   . History of cocaine abuse ([place])   . Hypertension   . Microcytic anemia   . PAF (paroxysmal atrial fibrillation) ([place])    a. Episode in 2008 with spontaneous conversion  . S/P implantation of automatic cardioverter/defibrillator (AICD)    a. Medtronic, implanted 2012.  . Sleep apnea   . Stroke [facility]) 2004  . Thyroid disease     Past Surgical History:   Procedure Laterality Date  . CARDIOVERSION N/A 01/07/2014   Procedure: CARDIOVERSION;  Surgeon: Lelon Perla, MD;  Location: [facility] ENDOSCOPY;  Service: Cardiovascular;  Laterality: N/A;  . CARDIOVERSION N/A 02/11/2014   Procedure: CARDIOVERSION;  Surgeon: Pixie Casino, MD;  Location: [place];  Service: Cardiovascular;  Laterality: N/A;  . COLONOSCOPY WITH PROPOFOL N/A 02/06/2018   Procedure: COLONOSCOPY WITH PROPOFOL;  Surgeon: Yetta Flock, MD;  Location: [facility] ENDOSCOPY;  Service: Gastroenterology;  Laterality: N/A;  . ICD GENERATOR CHANGEOUT N/A 01/17/2020   Procedure: [place];  Surgeon: Evans Lance, MD;  Location: [place] CV LAB;  Service: Cardiovascular;  Laterality: N/A;  . LASEK eye surgery Bilateral 05/2018  . LASIK Bilateral   . PACEMAKER INSERTION    . POLYPECTOMY  02/06/2018   Procedure: POLYPECTOMY;  Surgeon: Yetta Flock, MD;  Location: [facility] ENDOSCOPY;  Service: Gastroenterology;;  . TEE WITHOUT CARDIOVERSION N/A 01/07/2014   Procedure: TRANSESOPHAGEAL ECHOCARDIOGRAM (TEE);  Surgeon: Lelon Perla, MD;  Location: [facility] ENDOSCOPY;  Service: Cardiovascular;  Laterality: N/A;  . TEE WITHOUT CARDIOVERSION N/A 02/11/2014   Procedure: TRANSESOPHAGEAL ECHOCARDIOGRAM (TEE);  Surgeon: Pixie Casino, MD;  Location: [facility] ENDOSCOPY;  Service: Cardiovascular;  Laterality: N/A;  . TONSILLECTOMY    . TRANSTHORACIC ECHOCARDIOGRAM  10/2006, 12/2009    Current Medications: Current Meds  Medication Sig  . albuterol (VENTOLIN HFA) 108 (90 Base) MCG/ACT inhaler INHALE 2 PUFFS INTO THE LUNGS EVERY 6 HOURS AS NEEDED FOR WHEEZING OR SHORTNESS OF BREATH  . alprazolam (XANAX) 2 MG tablet Take 2 mg by mouth every 6 (six) hours as needed for anxiety.  Marland Kitchen amiodarone (PACERONE) 200 MG tablet Take one tablet by mouth daily Monday through Saturday.  Do NOT take on Sunday.  Marland Kitchen amLODipine (NORVASC) 10 MG tablet TAKE 1 TABLET BY MOUTH EVERY DAY  . budesonide-formoterol  (SYMBICORT) 160-4.5 MCG/ACT inhaler Inhale 2 puffs into the lungs 2 (two) times daily.  . carvedilol (COREG) 12.5 MG tablet TAKE 1 TABLET(12.5 MG) BY MOUTH TWICE DAILY WITH A MEAL  . cycloSPORINE (RESTASIS) 0.05 % ophthalmic emulsion Place 1 drop into both eyes 2 (two) times daily.  Marland Kitchen Dextran 70-Hypromellose, PF, (TEARS NATURALE FREE) 0.1-0.3 % SOLN Place 1 drop into both eyes daily as needed (Dry eyes).  . DULoxetine (CYMBALTA) 30 MG capsule Take 90 mg by mouth daily.   Marland Kitchen entecavir (BARACLUDE) 0.5 MG tablet Take 1 tablet (0.5 mg total) by mouth every other day. Please note: Every OTHER day  . ezetimibe (ZETIA) 10 MG tablet TAKE 1 TABLET(10 MG) BY MOUTH DAILY  . ferrous sulfate 325 (65 FE) MG tablet Take 325 mg by mouth daily with breakfast.  . fluticasone (FLONASE) 50 MCG/ACT nasal spray SHAKE LIQUID AND USE 1 SPRAY IN EACH NOSTRIL DAILY  . furosemide (LASIX) 40 MG tablet Take 2 tablets by mouth for 5 days then go back to 1 tablet by mouth daily  . hydrALAZINE (APRESOLINE) 50 MG tablet TAKE 1 TABLET(50 MG)  BY MOUTH TWICE DAILY  . Olopatadine HCl 0.2 % SOLN Apply 1 drop to eye daily.  . ondansetron (ZOFRAN-ODT) 8 MG disintegrating tablet Take 8 mg by mouth 2 (two) times daily as needed.  Marland Kitchen oxybutynin (DITROPAN) 5 MG tablet Take 5 mg by mouth 3 (three) times daily.  . polyethylene glycol (MIRALAX) 17 g packet Take as directed, Daily, titrate as needed  . sacubitril-valsartan (ENTRESTO) 49-51 MG Take 1 tablet by mouth 2 (two) times daily.  . sildenafil (REVATIO) 20 MG tablet Take 2-5 tablets by mouth once daily as needed prior to sexual activity.  . tamsulosin (FLOMAX) 0.4 MG CAPS capsule Take 0.4 mg by mouth 2 (two) times daily.  Alveda Reasons 20 MG TABS tablet TAKE 1 TABLET(20 MG) BY MOUTH DAILY WITH SUPPER  . [DISCONTINUED] sacubitril-valsartan (ENTRESTO) 24-26 MG Take 1 tablet by mouth 2 (two) times daily.     Allergies:   Codeine and Hydrocodone-acetaminophen   Social History   Socioeconomic  History  . Marital status: Divorced    Spouse name: Not on file  . Number of children: 2  . Years of education: Not on file  . Highest education level: Not on file  Occupational History  . Occupation: Merchandiser, retail: UNEMPLOYED    Comment: Writer in the past  Tobacco Use  . Smoking status: Former Smoker    Packs/day: 0.50    Years: 0.50    Pack years: 0.25    Types: Cigarettes    Quit date: 05/02/2010    Years since quitting: 10.1  . Smokeless tobacco: Never Used  . Tobacco comment: Significant Second-hand and only 3 months himself  Vaping Use  . Vaping Use: Never used  Substance and Sexual Activity  . Alcohol use: Not Currently    Alcohol/week: 0.0 standard drinks    Comment: rare  . Drug use: No    Comment: Reported history of cocaine abuse off and on   . Sexual activity: Not on file  Other Topics Concern  . Not on file  Social History Narrative   Living with a son who is a 70 year old.  He is not     working, unemployed for 3 years.  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  Denied use of alcohol.  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       [facility]:   Originally from [place], [place]. He grew up in [place]. He moved to [place] in 1985. 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 No pets currently. No bird exposure. He did have mold in a previous home. No hot tub exposure.         Social Determinants of Health   Financial Resource Strain: Not on file  Food Insecurity: Not on file  Transportation Needs: Not on file  Physical Activity: Not on file  Stress: Not on file  Social Connections: Not on file     Family History:  The patient's family history includes Breast cancer in his mother; Cerebral palsy in his daughter; Diabetes in his father; Multiple myeloma in his mother; Peripheral vascular disease in his father; Prostate cancer in his father. There is no history  of Lung disease, Colon cancer, Stomach cancer, Esophageal cancer, or Colon polyps.  ROS:   Review of Systems  Constitutional: Negative for chills and fever.  HENT: Positive for congestion.   Eyes: Negative for  blurred vision and redness.  Respiratory: Positive for shortness of breath.   Cardiovascular: Negative for chest pain, palpitations, orthopnea, claudication, leg swelling and PND.  Gastrointestinal: Negative for nausea and vomiting.  Genitourinary: Negative for hematuria.  Musculoskeletal: Positive for myalgias. Negative for falls.  Neurological: Negative for dizziness and loss of consciousness.  Endo/Heme/Allergies: Negative for polydipsia.  Psychiatric/Behavioral: Negative for substance abuse.     EKGs/Labs/Other Studies Reviewed:    Studies reviewed are outlined and summarized above. Reports included below if pertinent.  2d echo 05/29/20   1. Left ventricular ejection fraction, by estimation, is 25 to 30%. The  left ventricle has severely decreased function. The left ventricle  demonstrates regional wall motion abnormalities (see scoring  diagram/findings for description). There is moderate  left ventricular hypertrophy. Left ventricular diastolic parameters are  consistent with Grade II diastolic dysfunction (pseudonormalization).  2. Right ventricular systolic function is normal. The right ventricular  size is normal. There is normal pulmonary artery systolic pressure.  3. Left atrial size was severely dilated.  4. Right atrial size was mildly dilated.  5. The mitral valve is normal in structure. No evidence of mitral valve  regurgitation. No evidence of mitral stenosis.  6. The aortic valve is normal in structure. Aortic valve regurgitation is  not visualized. No aortic stenosis is present.  7. The inferior vena cava is normal in size with greater than 50%  respiratory variability, suggesting right atrial pressure of 3 mmHg.   Comparison(s): No significant  change from prior study. Prior images  reviewed side by side.      Recent Labs: 05/08/2020: ALT 11; Hemoglobin 18.1; NT-Pro BNP 1,120; Platelets 214; TSH 1.950 06/11/2020: BUN 21; Creatinine, Ser 2.25; Potassium 4.1; Sodium 144  Recent Lipid Panel    Component Value Date/Time   CHOL 196 05/08/2020 1332   TRIG 135 05/08/2020 1332   HDL 33 (L) 05/08/2020 1332   CHOLHDL 5.9 (H) 05/08/2020 1332   CHOLHDL 3.9 10/12/2015 1026   VLDL 27 10/12/2015 1026   LDLCALC 138 (H) 05/08/2020 1332    PHYSICAL EXAM:    VS:  BP (!) 150/96   Pulse 62   Ht 6\' 3"  (1.905 m)   Wt 283 lb (128.4 kg)   SpO2 98%   BMI 35.37 kg/m   BMI: Body mass index is 35.37 kg/m.  GEN: Well nourished, well developed male in no acute distress HEENT: normocephalic, atraumatic Neck: no JVD, carotid bruits, or masses Cardiac: RRR; no murmurs, rubs, or gallops, no edema  Respiratory:  clear to auscultation bilaterally, normal work of breathing GI: soft, nontender, nondistended, + BS MS: no deformity or atrophy Skin: warm and dry, no rash Neuro:  Alert and Oriented x 3, Strength and sensation are intact, follows commands Psych: euthymic mood, full affect  Wt Readings from Last 3 Encounters:  06/19/20 283 lb (128.4 kg)  05/07/20 290 lb (131.5 kg)  04/28/20 289 lb 3.2 oz (131.2 kg)     ASSESSMENT & PLAN:  #Chronic Systolic Heart Failure: #Non-ischemic cardiomyopathy: NYHA class II symptoms. Appears euvolemic today. Feeling much better since last vist. Has been taking medications as prescribed per patient report. Blood pressure is elevated today. -Continue lasix 40mg  daily -Increase entresto to 49-51mg  BID; repeat BMET in 2 weeks -CMP will be drawn by GI today -Continue hydralazine 50mg  BID -Add spironolactone in future as able -Plan to start farxiga 10mg  daily--will discuss with pharmacy to ensure okay with CrCl and ensure he has insurance coverage -S/p ICD placement -  Daily weights -Low Na diet  #Persistent  Afib: -Followed by EP -On amiodarone 200mg  daily -Continue xarelto  #HTN: -Continue amlodipine 10mg  daily -Continue coreg 12.5mg  BID -Increase entresto as above  #HLD: -Continue zetia 10mg  daily--patient states he is taking the medication -Consider referral to lipid clinic  #Cirrhosis: -Follow-up with GI as scheduled  Medication Adjustments/Labs and Tests Ordered: Current medicines are reviewed at length with the patient today.  Concerns regarding medicines are outlined above. Medication changes, Labs and Tests ordered today are summarized above and listed in the Patient Instructions accessible in Encounters.   Signed, Gwyndolyn Kaufman, MD  [address], [place], [place]  [address] Phone: [phone]; Fax: [phone]

## 2020-06-19 ENCOUNTER — Ambulatory Visit (INDEPENDENT_AMBULATORY_CARE_PROVIDER_SITE_OTHER): Payer: Medicaid Other | Admitting: Cardiology

## 2020-06-19 ENCOUNTER — Other Ambulatory Visit: Payer: Self-pay

## 2020-06-19 ENCOUNTER — Other Ambulatory Visit (INDEPENDENT_AMBULATORY_CARE_PROVIDER_SITE_OTHER): Payer: Medicaid Other

## 2020-06-19 VITALS — BP 150/96 | HR 62 | Ht 75.0 in | Wt 283.0 lb

## 2020-06-19 DIAGNOSIS — I10 Essential (primary) hypertension: Secondary | ICD-10-CM | POA: Diagnosis not present

## 2020-06-19 DIAGNOSIS — K7460 Unspecified cirrhosis of liver: Secondary | ICD-10-CM | POA: Diagnosis not present

## 2020-06-19 DIAGNOSIS — B181 Chronic viral hepatitis B without delta-agent: Secondary | ICD-10-CM | POA: Diagnosis not present

## 2020-06-19 DIAGNOSIS — I2510 Atherosclerotic heart disease of native coronary artery without angina pectoris: Secondary | ICD-10-CM | POA: Diagnosis not present

## 2020-06-19 DIAGNOSIS — I428 Other cardiomyopathies: Secondary | ICD-10-CM | POA: Diagnosis not present

## 2020-06-19 DIAGNOSIS — R9431 Abnormal electrocardiogram [ECG] [EKG]: Secondary | ICD-10-CM | POA: Diagnosis not present

## 2020-06-19 DIAGNOSIS — I4819 Other persistent atrial fibrillation: Secondary | ICD-10-CM | POA: Diagnosis not present

## 2020-06-19 DIAGNOSIS — E785 Hyperlipidemia, unspecified: Secondary | ICD-10-CM | POA: Diagnosis not present

## 2020-06-19 DIAGNOSIS — I5022 Chronic systolic (congestive) heart failure: Secondary | ICD-10-CM | POA: Diagnosis not present

## 2020-06-19 LAB — COMPREHENSIVE METABOLIC PANEL WITH GFR
ALT: 9 U/L (ref 0–53)
AST: 10 U/L (ref 0–37)
Albumin: 4.1 g/dL (ref 3.5–5.2)
Alkaline Phosphatase: 71 U/L (ref 39–117)
BUN: 34 mg/dL — ABNORMAL HIGH (ref 6–23)
CO2: 29 meq/L (ref 19–32)
Calcium: 9.6 mg/dL (ref 8.4–10.5)
Chloride: 102 meq/L (ref 96–112)
Creatinine, Ser: 2.13 mg/dL — ABNORMAL HIGH (ref 0.40–1.50)
GFR: 33.67 mL/min — ABNORMAL LOW
Glucose, Bld: 103 mg/dL — ABNORMAL HIGH (ref 70–99)
Potassium: 3.8 meq/L (ref 3.5–5.1)
Sodium: 139 meq/L (ref 135–145)
Total Bilirubin: 0.4 mg/dL (ref 0.2–1.2)
Total Protein: 7.5 g/dL (ref 6.0–8.3)

## 2020-06-19 LAB — PROTIME-INR
INR: 1.2 ratio — ABNORMAL HIGH (ref 0.8–1.0)
Prothrombin Time: 13.1 s (ref 9.6–13.1)

## 2020-06-19 MED ORDER — ENTRESTO 49-51 MG PO TABS: 1.0000 | ORAL_TABLET | Freq: Two times a day (BID) | ORAL | 1 refills | Status: DC

## 2020-06-19 NOTE — Patient Instructions (Addendum)
Medication Instructions:  Your physician has recommended you make the following change in your medication:  1.  INCREASE the Entresto to 49/51 taking 1 tablet twice a day   *If you need a refill on your cardiac medications before your next appointment, please call your pharmacy*   Lab Work: 07/03/2021 come to the office for:  BMET  Anytime after 7:30 a.m.  If you have labs (blood work) drawn today and your tests are completely normal, you will receive your results only by: Marland Kitchen MyChart Message (if you have MyChart) OR . A paper copy in the mail If you have any lab test that is abnormal or we need to change your treatment, we will call you to review the results.   Testing/Procedures: None ordered   Follow-Up: At [facility], you and your health needs are our priority.  As part of our continuing mission to provide you with exceptional heart care, we have created designated Provider Care Teams.  These Care Teams include your primary Cardiologist (physician) and Advanced Practice Providers (APPs -  Physician Assistants and Nurse Practitioners) who all work together to provide you with the care you need, when you need it.  We recommend signing up for the patient portal called "MyChart".  Sign up information is provided on this After Visit Summary.  MyChart is used to connect with patients for Virtual Visits (Telemedicine).  Patients are able to view lab/test results, encounter notes, upcoming appointments, etc.  Non-urgent messages can be sent to your provider as well.   To learn more about what you can do with MyChart, go to [email or web address].    Your next appointment:   3 month(s)  The format for your next appointment:   In Person  Provider:   You may see [name], MD or one of the following Advanced Practice Providers on your designated Care Team:    Melina Copa, [place]    Other Instructions

## 2020-06-23 ENCOUNTER — Telehealth: Payer: Self-pay

## 2020-06-23 ENCOUNTER — Other Ambulatory Visit: Payer: Self-pay | Admitting: Cardiology

## 2020-06-23 LAB — HEPATITIS B DNA, ULTRAQUANTITATIVE, PCR
Hepatitis B DNA (Calc): <1 Log IU/mL
Hepatitis B DNA: <10 IU/mL

## 2020-06-23 LAB — AFP TUMOR MARKER: AFP-Tumor Marker: 1.6 ng/mL (ref <6.1)

## 2020-06-23 LAB — HEPATITIS B E ANTIBODY: Hep B E Ab: NONREACTIVE

## 2020-06-23 LAB — HEPATITIS B E ANTIGEN: Hep B E Ag: NONREACTIVE

## 2020-06-23 MED ORDER — DAPAGLIFLOZIN PROPANEDIOL 10 MG PO TABS: 10.0000 mg | ORAL_TABLET | Freq: Every day | ORAL | 3 refills | Status: DC

## 2020-06-23 NOTE — Telephone Encounter (Signed)
Called patient to let him know that Cody Ortega was approved. Patient has lab work on 07/03/20 for BMET. Patient verbalized understanding.

## 2020-06-23 NOTE — Telephone Encounter (Signed)
-----  Message from Freada Bergeron, MD sent at 06/23/2020  8:16 AM EST ----- Will you let him know that farxiga was approved by his insurance. I just sent the script to his pharmacy. We just need to watch his kidney function on the medication but it really helps improves outcomes for people with heart failure. ----- Message ----- From: Leeroy Bock, RPH-CPP Sent: 06/23/2020   7:16 AM EST To: Freada Bergeron, MD  Prior authorization for Farxiga $RemoveBef'10mg'[id number]$  was approved, looks like his eGFR from Friday stayed above 25 too so you should be good to go!

## 2020-06-26 ENCOUNTER — Other Ambulatory Visit: Payer: Self-pay

## 2020-06-26 DIAGNOSIS — B181 Chronic viral hepatitis B without delta-agent: Secondary | ICD-10-CM

## 2020-06-26 DIAGNOSIS — K7460 Unspecified cirrhosis of liver: Secondary | ICD-10-CM

## 2020-06-29 ENCOUNTER — Other Ambulatory Visit: Payer: Self-pay | Admitting: Internal Medicine

## 2020-06-29 DIAGNOSIS — J449 Chronic obstructive pulmonary disease, unspecified: Secondary | ICD-10-CM

## 2020-07-03 ENCOUNTER — Other Ambulatory Visit: Payer: Self-pay

## 2020-07-03 ENCOUNTER — Other Ambulatory Visit: Payer: Medicaid Other | Admitting: *Deleted

## 2020-07-03 DIAGNOSIS — R9431 Abnormal electrocardiogram [ECG] [EKG]: Secondary | ICD-10-CM | POA: Diagnosis not present

## 2020-07-03 DIAGNOSIS — I4819 Other persistent atrial fibrillation: Secondary | ICD-10-CM | POA: Diagnosis not present

## 2020-07-03 DIAGNOSIS — I10 Essential (primary) hypertension: Secondary | ICD-10-CM

## 2020-07-03 DIAGNOSIS — I2510 Atherosclerotic heart disease of native coronary artery without angina pectoris: Secondary | ICD-10-CM | POA: Diagnosis not present

## 2020-07-03 DIAGNOSIS — I428 Other cardiomyopathies: Secondary | ICD-10-CM

## 2020-07-03 DIAGNOSIS — I5022 Chronic systolic (congestive) heart failure: Secondary | ICD-10-CM

## 2020-07-03 DIAGNOSIS — E785 Hyperlipidemia, unspecified: Secondary | ICD-10-CM

## 2020-07-03 LAB — BASIC METABOLIC PANEL
BUN/Creatinine Ratio: 18 (ref 9–20)
BUN: 40 mg/dL — ABNORMAL HIGH (ref 6–24)
CO2: 23 mmol/L (ref 20–29)
Calcium: 9.2 mg/dL (ref 8.7–10.2)
Chloride: 100 mmol/L (ref 96–106)
Creatinine, Ser: 2.17 mg/dL — ABNORMAL HIGH (ref 0.76–1.27)
Glucose: 105 mg/dL — ABNORMAL HIGH (ref 65–99)
Potassium: 3.7 mmol/L (ref 3.5–5.2)
Sodium: 143 mmol/L (ref 134–144)
eGFR: 35 mL/min/{1.73_m2} — ABNORMAL LOW (ref >59)

## 2020-07-07 ENCOUNTER — Telehealth: Payer: Self-pay | Admitting: Cardiology

## 2020-07-07 NOTE — Telephone Encounter (Signed)
-----   Message from Freada Bergeron, MD sent at 07/03/2020  6:42 PM EST ----- Electrolytes and kidney function look stable. No changes in medication.

## 2020-07-07 NOTE — Telephone Encounter (Signed)
The patient has been notified of the result and verbalized understanding.  All questions (if any) were answered. Nuala Alpha, LPN 08/02/8766 11:57 PM

## 2020-07-07 NOTE — Telephone Encounter (Signed)
    Pt is calling back to get lab result, pt said to please leave detailed message on his voice mail of the results and any instructions if he unable to answer phone.  Also, he said, he's been requesting to this is the the future. Any future lab work, if we are calling for result to leave him a detailed message about it.

## 2020-07-07 NOTE — Telephone Encounter (Signed)
Left a detailed message on the pts confirmed voicemail as he requested [place] to do so, informing him that per Dr. Johney Frame, his labs showed that his electrolytes and kidney function are stable, and he should continue his current medications.  Left a detailed message for the pt to call back with any further questions or concerns regarding this.

## 2020-07-15 ENCOUNTER — Ambulatory Visit (HOSPITAL_COMMUNITY)
Admission: RE | Admit: 2020-07-15 | Discharge: 2020-07-15 | Disposition: A | Discharge status: Home / Self Care | Payer: Medicaid Other | Source: Ambulatory Visit | Attending: Gastroenterology | Admitting: Gastroenterology

## 2020-07-15 ENCOUNTER — Telehealth: Payer: Self-pay

## 2020-07-15 ENCOUNTER — Other Ambulatory Visit: Payer: Self-pay

## 2020-07-15 DIAGNOSIS — K7460 Unspecified cirrhosis of liver: Secondary | ICD-10-CM | POA: Insufficient documentation

## 2020-07-15 DIAGNOSIS — N281 Cyst of kidney, acquired: Secondary | ICD-10-CM | POA: Diagnosis not present

## 2020-07-15 DIAGNOSIS — F639 Impulse disorder, unspecified: Secondary | ICD-10-CM | POA: Diagnosis not present

## 2020-07-15 DIAGNOSIS — F332 Major depressive disorder, recurrent severe without psychotic features: Secondary | ICD-10-CM | POA: Diagnosis not present

## 2020-07-15 DIAGNOSIS — F410 Panic disorder [episodic paroxysmal anxiety] without agoraphobia: Secondary | ICD-10-CM | POA: Diagnosis not present

## 2020-07-15 DIAGNOSIS — B181 Chronic viral hepatitis B without delta-agent: Secondary | ICD-10-CM | POA: Diagnosis not present

## 2020-07-15 NOTE — Telephone Encounter (Signed)
Spoke with patient in regards to his lab results from 06/19/20, patient states that we can leave a detailed message with results on his voicemail. Patient also had RUQ US done today and is aware that we will call him with the results.

## 2020-07-31 DIAGNOSIS — F331 Major depressive disorder, recurrent, moderate: Secondary | ICD-10-CM | POA: Diagnosis not present

## 2020-07-31 DIAGNOSIS — F410 Panic disorder [episodic paroxysmal anxiety] without agoraphobia: Secondary | ICD-10-CM | POA: Diagnosis not present

## 2020-08-10 ENCOUNTER — Other Ambulatory Visit: Payer: Self-pay

## 2020-08-10 MED ORDER — ENTRESTO 49-51 MG PO TABS: 1.0000 | ORAL_TABLET | Freq: Two times a day (BID) | ORAL | 3 refills | Status: DC

## 2020-09-07 ENCOUNTER — Other Ambulatory Visit: Payer: Self-pay | Admitting: Cardiovascular Disease

## 2020-09-07 NOTE — Telephone Encounter (Signed)
Pt's age 60, wt 128.4 kg, SCr 2.17, CrCl 68.21, last ov w/ HP 06/19/20.

## 2020-09-08 ENCOUNTER — Ambulatory Visit: Payer: Self-pay

## 2020-09-08 NOTE — Telephone Encounter (Signed)
Will forward to provider  

## 2020-09-08 NOTE — Telephone Encounter (Signed)
Pt. Reports he has had vision issues x 1 year. Has "hazy vision or blurred vision." Comes and goes.Has seen his eye doctor and "she said I need to see my doctor. It could be my medications causing problems." Has an appointment 10/15/20, but would like to be seen sooner. Please advise. Answer Assessment - Initial Assessment Questions 1. DESCRIPTION: "What is the vision loss like? Describe it for me." (e.g., complete vision loss, blurred vision, double vision, floaters, etc.)     Burred vision 2. LOCATION: "One or both eyes?" If one, ask: "Which eye?"     Both eyes 3. SEVERITY: "Can you see anything?" If Yes, ask: "What can you see?" (e.g., fine print)     Good today 4. ONSET: "When did this begin?" "Did it start suddenly or has this been gradual?"     Started 1 year ago 5. PATTERN: "Does this come and go, or has it been constant since it started?"     Comes and eyes 6. PAIN: "Is there any pain in your eye(s)?"  (Scale 1-10; or mild, moderate, severe)     No 7. CONTACTS-GLASSES: "Do you wear contacts or glasses?"     No 8. CAUSE: "What do you think is causing this visual problem?"     Unsure 9. OTHER SYMPTOMS: "Do you have any other symptoms?" (e.g., confusion, headache, arm or leg weakness, speech problems)     No 10. PREGNANCY: "Is there any chance you are pregnant?" "When was your last menstrual period?"       n/a  Protocols used: [place]

## 2020-09-09 NOTE — Telephone Encounter (Signed)
Contacted pt to go over provider response pt didn't answer lvm  

## 2020-09-13 ENCOUNTER — Encounter: Payer: Self-pay | Admitting: Physician Assistant

## 2020-09-13 NOTE — Progress Notes (Addendum)
Cardiology Office Note    Date:  09/15/2020   ID:  Cody Ortega, DOB 10-07-1960, MRN [id number]  PCP:  Ladell Pier, MD  Cardiologist:  [name], MD  Electrophysiologist:  Cristopher Peru, MD   Chief Complaint: f/u CHF  History of Present Illness:   Cody Ortega is a 60 y.o. 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 Havery Moros), anxiety, arthritis, asthma, paroxysmal atrial fibrillation requiring prior TEE/DCCV treated with amiodarone, diverticulosis, HTN, stroke, sleep apnea (intolerant of CPAP, uses nose strips), CKD stage IV (followed by Dr. Candiss Norse) and thyroid disease.  He has historically followed with both Dr. Burt Knack and Dr. Lovena Le with history outlined above. Per notes, it has been very important that he maintain NSR as he did not previously tolerate afib therefore amiodarone felt necessary. He has had prior TEE/DCCV several years ago. 2D echo 04/2019 showed EF 30-35%. He was started on Lasix in 12/2019. He underwent gen change in 01/2020. I met him in followup 05/2020 at which time he had gained weight during the pandemic in context of decreased exercise and had noticed worsening SOB. + chronic cough noted. CXR showed mild pulmonary vascular congestion. I spoke with Dr. Burt Knack and we elected a trial of increased Lasix x5 days followed by subsequent addition of Entresto. The patient did not initially enact this plan until late January as he was very difficult to get in touch with. He would occasionally answer then request we call back but not answer. Dr. Burt Knack has also relayed a history of difficulty with medication compliance (hence why hydralazine is at BID for example). F/u 2D echo 05/29/20 showed EF 25-30%, moderate LVH, grade 2 DD, severe  LAE, mild RAE. The physician reading the echo reviewed prior images side by side and did not feel there was a significant change from previous echo. He saw Dr. Johney Frame back in follow-up 06/2020 at which time he was still hypertensive so Entresto was increased and [place] added.  He presents back for follow-up overall feeling fatigued and more lightheaded than usual. He is under a lot of stress as his son in [place] is exhibiting signs of schizophrenia. No CP, edema, palpitations or syncope. No worsening of baseline SOB. He is noted to be back in afib today with rates 70s-120s. BP recheck by me 105/60. He states he is only taking Entresto once a day - states "The bottle said take 2 a day until they're used up and then 1 a day" - he does not know which dose the bottle is. He did not bring his bottles with him today. Remains abstinent of cocaine.   Labwork independently reviewed: 06/2020 K 3.7, Cr 2.17 06/2020 LFTS wnl 05/2020 LDL 138, TSH wnl, Hgb 18.1  Past Medical History:  Diagnosis Date  . Anxiety   . Arthritis   . Benzodiazepine dependence ([place])   . Bronchial asthma   . CAD (coronary artery disease)    a. Cath 03/2010: mod RCA stenosis, severe diag stenosis, treated medically given lack of angina.  . 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 Chronic systolic congestive heart failure ([place])   . Cirrhosis ([place])    secondary to hepatitis B  . CKD (chronic kidney disease), stage IV ([facility])    Stage 3-4  . Depression   .  Diverticulosis   . Hepatitis B   . History of cocaine abuse ([place])   . Hyperlipidemia LDL goal <70   . Hypertension   . Microcytic anemia   . Persistent atrial fibrillation ([place])    a. Episode in 2008 with spontaneous conversion, on amiodarone per EP.  . S/P implantation of automatic cardioverter/defibrillator (AICD)    a. Medtronic, implanted 2012.  . Sleep apnea   . Stroke [facility]) 2004  . Thyroid disease     Past Surgical History:  Procedure Laterality  Date  . CARDIOVERSION N/A 01/07/2014   Procedure: CARDIOVERSION;  Surgeon: Lelon Perla, MD;  Location: [facility] ENDOSCOPY;  Service: Cardiovascular;  Laterality: N/A;  . CARDIOVERSION N/A 02/11/2014   Procedure: CARDIOVERSION;  Surgeon: Pixie Casino, MD;  Location: [place];  Service: Cardiovascular;  Laterality: N/A;  . COLONOSCOPY WITH PROPOFOL N/A 02/06/2018   Procedure: COLONOSCOPY WITH PROPOFOL;  Surgeon: Yetta Flock, MD;  Location: [facility] ENDOSCOPY;  Service: Gastroenterology;  Laterality: N/A;  . ICD GENERATOR CHANGEOUT N/A 01/17/2020   Procedure: [place];  Surgeon: Evans Lance, MD;  Location: [place] CV LAB;  Service: Cardiovascular;  Laterality: N/A;  . LASEK eye surgery Bilateral 05/2018  . LASIK Bilateral   . PACEMAKER INSERTION    . POLYPECTOMY  02/06/2018   Procedure: POLYPECTOMY;  Surgeon: Yetta Flock, MD;  Location: [facility] ENDOSCOPY;  Service: Gastroenterology;;  . TEE WITHOUT CARDIOVERSION N/A 01/07/2014   Procedure: TRANSESOPHAGEAL ECHOCARDIOGRAM (TEE);  Surgeon: Lelon Perla, MD;  Location: [facility] ENDOSCOPY;  Service: Cardiovascular;  Laterality: N/A;  . TEE WITHOUT CARDIOVERSION N/A 02/11/2014   Procedure: TRANSESOPHAGEAL ECHOCARDIOGRAM (TEE);  Surgeon: Pixie Casino, MD;  Location: [facility] ENDOSCOPY;  Service: Cardiovascular;  Laterality: N/A;  . TONSILLECTOMY    . TRANSTHORACIC ECHOCARDIOGRAM  10/2006, 12/2009    Current Medications: Current Meds  Medication Sig  . albuterol (VENTOLIN HFA) 108 (90 Base) MCG/ACT inhaler INHALE 2 PUFFS INTO THE LUNGS EVERY 6 HOURS AS NEEDED FOR WHEEZING OR SHORTNESS OF BREATH  . alprazolam (XANAX) 2 MG tablet Take 2 mg by mouth every 6 (six) hours as needed for anxiety.  Marland Kitchen amiodarone (PACERONE) 200 MG tablet Take one tablet by mouth daily Monday through Saturday.  Do NOT take on Sunday.  Marland Kitchen amLODipine (NORVASC) 10 MG tablet TAKE 1 TABLET BY MOUTH EVERY DAY  . carvedilol (COREG) 12.5 MG tablet TAKE 1  TABLET(12.5 MG) BY MOUTH TWICE DAILY WITH A MEAL  . dapagliflozin propanediol (FARXIGA) 10 MG TABS tablet Take 1 tablet (10 mg total) by mouth daily before breakfast.  . Dextran 70-Hypromellose, PF, (TEARS NATURALE FREE) 0.1-0.3 % SOLN Place 1 drop into both eyes daily as needed (Dry eyes).  . DULoxetine (CYMBALTA) 30 MG capsule Take 90 mg by mouth daily.   Marland Kitchen entecavir (BARACLUDE) 0.5 MG tablet Take 1 tablet (0.5 mg total) by mouth every other day. Please note: Every OTHER day  . ezetimibe (ZETIA) 10 MG tablet TAKE 1 TABLET(10 MG) BY MOUTH DAILY  . ferrous sulfate 325 (65 FE) MG tablet Take 325 mg by mouth daily with breakfast.  . fluticasone (FLONASE) 50 MCG/ACT nasal spray SHAKE LIQUID AND USE 1 SPRAY IN EACH NOSTRIL DAILY  . furosemide (LASIX) 40 MG tablet Take 2 tablets by mouth for 5 days then go back to 1 tablet by mouth daily  . hydrALAZINE (APRESOLINE) 50 MG tablet TAKE 1 TABLET(50 MG) BY MOUTH TWICE DAILY  . ondansetron (ZOFRAN-ODT) 8 MG disintegrating tablet Take  8 mg by mouth 2 (two) times daily as needed.  Marland Kitchen oxybutynin (DITROPAN) 5 MG tablet Take 5 mg by mouth 3 (three) times daily.  . polyethylene glycol (MIRALAX) 17 g packet Take as directed, Daily, titrate as needed  . sacubitril-valsartan (ENTRESTO) 49-51 MG Take 1 tablet by mouth daily.  . sildenafil (REVATIO) 20 MG tablet Take 2-5 tablets by mouth once daily as needed prior to sexual activity.  . SYMBICORT 160-4.5 MCG/ACT inhaler INHALE 2 PUFFS INTO THE LUNGS TWICE DAILY  . tamsulosin (FLOMAX) 0.4 MG CAPS capsule Take 0.4 mg by mouth 2 (two) times daily.  Cody Ortega 20 MG TABS tablet TAKE 1 TABLET(20 MG) BY MOUTH DAILY WITH SUPPER       Allergies:   Prochlorperazine, Codeine, and Hydrocodone-acetaminophen   Social History   Socioeconomic History  . Marital status: Divorced    Spouse name: Not on file  . Number of children: 2  . Years of education: Not on file  . Highest education level: Not on file  Occupational  History  . Occupation: Merchandiser, retail: UNEMPLOYED    Comment: Writer in the past  Tobacco Use  . Smoking status: Former Smoker    Packs/day: 0.50    Years: 0.50    Pack years: 0.25    Types: Cigarettes    Quit date: 05/02/2010    Years since quitting: 10.3  . Smokeless tobacco: Never Used  . Tobacco comment: Significant Second-hand and only 3 months himself  Vaping Use  . Vaping Use: Never used  Substance and Sexual Activity  . Alcohol use: Not Currently    Alcohol/week: 0.0 standard drinks    Comment: rare  . Drug use: No    Comment: Reported history of cocaine abuse off and on   . Sexual activity: Not on file  Other Topics Concern  . Not on file  Social History Narrative   Living with a son who is a 8 year old.  He is not     working, unemployed for 3 years.  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  Denied use of alcohol.  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       [facility]:   Originally from [place], [place]. He grew up in [place]. He moved to [place] in 1985. 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 No pets currently. No bird exposure. He did have mold in a previous home. No hot tub exposure.         Social Determinants of Health   Financial Resource Strain: Not on file  Food Insecurity: Not on file  Transportation Needs: Not on file  Physical Activity: Not on file  Stress: Not on file  Social Connections: Not on file     Family History:  The patient's family history includes Breast cancer in his mother; Cerebral palsy in his daughter; Diabetes in his father; Multiple myeloma in his mother; Peripheral vascular disease in his father; Prostate cancer in his father. There is no history of Lung disease, Colon cancer, Stomach cancer, Esophageal cancer, or Colon polyps.  ROS:   Please see the history of present illness.  All other systems are reviewed and otherwise  negative.    EKGs/Labs/Other Studies Reviewed:    Studies reviewed are outlined and summarized above. Reports included below if pertinent.  2D Echo 05/2020    1. Left ventricular ejection  fraction, by estimation, is 25 to 30%. The  left ventricle has severely decreased function. The left ventricle  demonstrates regional wall motion abnormalities (see scoring  diagram/findings for description). There is moderate  left ventricular hypertrophy. Left ventricular diastolic parameters are  consistent with Grade II diastolic dysfunction (pseudonormalization).  2. Right ventricular systolic function is normal. The right ventricular  size is normal. There is normal pulmonary artery systolic pressure.  3. Left atrial size was severely dilated.  4. Right atrial size was mildly dilated.  5. The mitral valve is normal in structure. No evidence of mitral valve  regurgitation. No evidence of mitral stenosis.  6. The aortic valve is normal in structure. Aortic valve regurgitation is  not visualized. No aortic stenosis is present.  7. The inferior vena cava is normal in size with greater than 50%  respiratory variability, suggesting right atrial pressure of 3 mmHg.   Comparison(s): No significant change from prior study. Prior images  reviewed side by side.   Cath scan from 2011  2D echo 04/2019   1. Left ventricular ejection fraction, by visual estimation, is 30 to  35%. The left ventricle has severely decreased function. Apex not  well-visualized. There is moderately increased left ventricular  hypertrophy.  2. The left ventricle demonstrates global hypokinesis.  3. The average left ventricular global longitudinal strain is -9.1 %.  4. Moderate to severely dilated left ventricular internal cavity size.  5. Left ventricular diastolic parameters are consistent with Grade II  diastolic dysfunction (pseudonormalization).  6. Elevated left atrial pressure.  7. Global right  ventricle has normal systolic function.The right  ventricular size is normal. No increase in right ventricular wall  thickness.  8. Left atrial size was severely dilated.  9. Right atrial size was normal.  10. Trivial pericardial effusion is present.  11. The mitral valve is normal in structure. No evidence of mitral valve  regurgitation.  12. The tricuspid valve is normal in structure. Tricuspid valve  regurgitation is not demonstrated.  13. The aortic valve is tricuspid. Aortic valve regurgitation is not  visualized. No evidence of aortic valve sclerosis or stenosis.  14. The pulmonic valve was not well visualized. Pulmonic valve  regurgitation is not visualized.     EKG:  EKG is ordered today, personally reviewed, demonstrating atrial fib 120bpm, prior anterior infarct, LAD, nonspecific STTW changes, NSIVCD - similar morphology to prior except now AF  Recent Labs: 05/08/2020: Hemoglobin 18.1; NT-Pro BNP 1,120; Platelets 214; TSH 1.950 06/19/2020: ALT 9 07/03/2020: BUN 40; Creatinine, Ser 2.17; Potassium 3.7; Sodium 143  Recent Lipid Panel    Component Value Date/Time   CHOL 196 05/08/2020 1332   TRIG 135 05/08/2020 1332   HDL 33 (L) 05/08/2020 1332   CHOLHDL 5.9 (H) 05/08/2020 1332   CHOLHDL 3.9 10/12/2015 1026   VLDL 27 10/12/2015 1026   LDLCALC 138 (H) 05/08/2020 1332    PHYSICAL EXAM:    VS:  BP 100/60   Pulse 77   Ht 6' 3" (1.905 m)   Wt 278 lb (126.1 kg)   SpO2 97%   BMI 34.75 kg/m   BMI: Body mass index is 34.75 kg/m.  GEN: Well nourished, well developed male in no acute distress HEENT: normocephalic, atraumatic Neck: no JVD, carotid bruits, or masses Cardiac: irregularly irregular, rate variable; no murmurs, rubs, or gallops, no edema  Respiratory:  clear to auscultation bilaterally, normal work of breathing GI: soft, nontender, nondistended, + BS MS: no deformity or atrophy Skin: warm  and dry, no rash Neuro:  Alert and Oriented x 3, Strength and sensation  are intact, follows commands Psych: euthymic mood, full affect  Wt Readings from Last 3 Encounters:  09/15/20 278 lb (126.1 kg)  09/15/20 278 lb (126.1 kg)  06/19/20 283 lb (128.4 kg)     ASSESSMENT & PLAN:   1. Persistent atrial fibrillation - he is back in atrial fib today and feeling poorly. HR was 106 earlier at neurology office as well. When asked if he missed any doses of Xarelto he states "it's entirely possible." He does report he makes a very conscious effort to use his pill box with his wife's help but one day recently he accidentally dropped all of his pills out of the pill box and he had to arrange them all back to the best of his ability. I discussed his case with Dr. Lovena Le who recommended to increase his amiodarone to 222m BID and plan TEE/DCCV after at least 2 weeks of the increased amiodarone load. TEE preferred with cardioversion due to concern for possible lapse in med compliance. I did stress the utmost importance of NOT MISSING A SINGLE DOSE going forward. He verbalized understanding. Will also check CMET, CBC, Mg, thyroid today. He will f/u afib clinic after TEE/DCCV. He does have h/o cirrhosis per notes but previously tolerated TEE/DCCV without adverse effect. Otherwise continue carvedilol as discussed. See below re: consent. ADDENDUM: post visit it was noted the patient has not had a recent device interrogation since 04/2020. Will reach out to EP team to see if they can investigate this as it may infer decisions about whether his recent onset of atrial fib is paroxysmal or persistent.  2. Chronic systolic CHF/NICM - clinically appears generally euvolemic on exam. He does not know how he is taking his Entresto. We will have him stop this for now until he can clarify the dose. We also reminded him to keep his Lasix at 470monce daily (this was previously briefly titrated which he may be [facility] We will check in with him when we get his lab results back.  Consideration could be given to restarting at a lower dose and instead holding/decreasing amlodipine if Cr stable. Otherwise continue present regimen as outlined. Check CrCl today.  3. Essential HTN - with low-normal BP today. Follow with med changes above.  4. CAD with HLD goal LDL <70 - not on ASA as he is on Xarelto. Not on statin due to liver disease but has been instead maintained on ezetimibe. Check CMET today along with lipid panel with direct LDL - ate around 1pm. He had apparently not been taking ezetimibe during the last check when LDL was elevated.  5. CKD stage IV - recheck labs today.  Disposition: F/u with afib clinic after DCCV.  Shared Decision Making/Informed Consent The risks [stroke, cardiac arrhythmias rarely resulting in the need for a temporary or permanent pacemaker, skin irritation or burns, esophageal damage, perforation (1:10,000 risk), bleeding, pharyngeal hematoma as well as other potential complications associated with conscious sedation including aspiration, arrhythmia, respiratory failure and death], benefits (treatment guidance, restoration of normal sinus rhythm, diagnostic support) and alternatives of a transesophageal echocardiogram guided cardioversion were discussed in detail with Mr. FaKaufholdnd he is willing to proceed.   Medication Adjustments/Labs and Tests Ordered: Current medicines are reviewed at length with the patient today.  Concerns regarding medicines are outlined above. Medication changes, Labs and Tests ordered today are summarized above and listed in the Patient  Instructions accessible in Encounters.   Signed, Charlie Pitter, PA-C  09/15/2020 3:50 PM    [facility] [address], [place], [place]  [address] Phone: [phone]; Fax: [phone]

## 2020-09-13 NOTE — H&P (View-Only) (Signed)
Cardiology Office Note    Date:  09/15/2020   ID:  JAQUELL SEDDON, DOB Apr 21, 1961, MRN [id number]  PCP:  Ladell Pier, MD  Cardiologist:  [name], MD  Electrophysiologist:  Cristopher Peru, MD   Chief Complaint: f/u CHF  History of Present Illness:   Cody Ortega is a 60 y.o. 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 Havery Moros), anxiety, arthritis, asthma, paroxysmal atrial fibrillation requiring prior TEE/DCCV treated with amiodarone, diverticulosis, HTN, stroke, sleep apnea (intolerant of CPAP, uses nose strips), CKD stage IV (followed by Dr. Candiss Norse) and thyroid disease.  He has historically followed with both Dr. Burt Knack and Dr. Lovena Le with history outlined above. Per notes, it has been very important that he maintain NSR as he did not previously tolerate afib therefore amiodarone felt necessary. He has had prior TEE/DCCV several years ago. 2D echo 04/2019 showed EF 30-35%. He was started on Lasix in 12/2019. He underwent gen change in 01/2020. I met him in followup 05/2020 at which time he had gained weight during the pandemic in context of decreased exercise and had noticed worsening SOB. + chronic cough noted. CXR showed mild pulmonary vascular congestion. I spoke with Dr. Burt Knack and we elected a trial of increased Lasix x5 days followed by subsequent addition of Entresto. The patient did not initially enact this plan until late January as he was very difficult to get in touch with. He would occasionally answer then request we call back but not answer. Dr. Burt Knack has also relayed a history of difficulty with medication compliance (hence why hydralazine is at BID for example). F/u 2D echo 05/29/20 showed EF 25-30%, moderate LVH, grade 2 DD, severe  LAE, mild RAE. The physician reading the echo reviewed prior images side by side and did not feel there was a significant change from previous echo. He saw Dr. Johney Frame back in follow-up 06/2020 at which time he was still hypertensive so Entresto was increased and [place] added.  He presents back for follow-up overall feeling fatigued and more lightheaded than usual. He is under a lot of stress as his son in [place] is exhibiting signs of schizophrenia. No CP, edema, palpitations or syncope. No worsening of baseline SOB. He is noted to be back in afib today with rates 70s-120s. BP recheck by me 105/60. He states he is only taking Entresto once a day - states "The bottle said take 2 a day until they're used up and then 1 a day" - he does not know which dose the bottle is. He did not bring his bottles with him today. Remains abstinent of cocaine.   Labwork independently reviewed: 06/2020 K 3.7, Cr 2.17 06/2020 LFTS wnl 05/2020 LDL 138, TSH wnl, Hgb 18.1  Past Medical History:  Diagnosis Date  . Anxiety   . Arthritis   . Benzodiazepine dependence ([place])   . Bronchial asthma   . CAD (coronary artery disease)    a. Cath 03/2010: mod RCA stenosis, severe diag stenosis, treated medically given lack of angina.  . 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 Chronic systolic congestive heart failure ([place])   . Cirrhosis ([place])    secondary to hepatitis B  . CKD (chronic kidney disease), stage IV (H[facility])    Stage 3-4  . Depression   .  Diverticulosis   . Hepatitis B   . History of cocaine abuse ([facility])   . Hyperlipidemia LDL goal <70   . Hypertension   . Microcytic anemia   . Persistent atrial fibrillation ([facility])    a. Episode in 2008 with spontaneous conversion, on amiodarone per EP.  . S/P implantation of automatic cardioverter/defibrillator (AICD)    a. Medtronic, implanted 2012.  . Sleep apnea   . Stroke ([facility]) 2004  . Thyroid disease     Past Surgical History:  Procedure Laterality  Date  . CARDIOVERSION N/A 01/07/2014   Procedure: CARDIOVERSION;  Surgeon: Brian S Crenshaw, MD;  Location: [facility];  Service: Cardiovascular;  Laterality: N/A;  . CARDIOVERSION N/A 02/11/2014   Procedure: CARDIOVERSION;  Surgeon: Kenneth C. Hilty, MD;  Location: [facility];  Service: Cardiovascular;  Laterality: N/A;  . COLONOSCOPY WITH PROPOFOL N/A 02/06/2018   Procedure: COLONOSCOPY WITH PROPOFOL;  Surgeon: Armbruster, Steven P, MD;  Location: [facility];  Service: Gastroenterology;  Laterality: N/A;  . ICD GENERATOR CHANGEOUT N/A 01/17/2020   Procedure: ICD GENERATOR CHANGEOUT;  Surgeon: Taylor, Gregg W, MD;  Location: [facility];  Service: Cardiovascular;  Laterality: N/A;  . LASEK eye surgery Bilateral 05/2018  . LASIK Bilateral   . PACEMAKER INSERTION    . POLYPECTOMY  02/06/2018   Procedure: POLYPECTOMY;  Surgeon: Armbruster, Steven P, MD;  Location: [facility];  Service: Gastroenterology;;  . TEE WITHOUT CARDIOVERSION N/A 01/07/2014   Procedure: TRANSESOPHAGEAL ECHOCARDIOGRAM (TEE);  Surgeon: Brian S Crenshaw, MD;  Location: [facility];  Service: Cardiovascular;  Laterality: N/A;  . TEE WITHOUT CARDIOVERSION N/A 02/11/2014   Procedure: TRANSESOPHAGEAL ECHOCARDIOGRAM (TEE);  Surgeon: Kenneth C. Hilty, MD;  Location: [facility];  Service: Cardiovascular;  Laterality: N/A;  . TONSILLECTOMY    . TRANSTHORACIC ECHOCARDIOGRAM  10/2006, 12/2009    Current Medications: Current Meds  Medication Sig  . albuterol (VENTOLIN HFA) 108 (90 Base) MCG/ACT inhaler INHALE 2 PUFFS INTO THE LUNGS EVERY 6 HOURS AS NEEDED FOR WHEEZING OR SHORTNESS OF BREATH  . alprazolam (XANAX) 2 MG tablet Take 2 mg by mouth every 6 (six) hours as needed for anxiety.  . amiodarone (PACERONE) 200 MG tablet Take one tablet by mouth daily Monday through Saturday.  Do NOT take on Sunday.  . amLODipine (NORVASC) 10 MG tablet TAKE 1 TABLET BY MOUTH EVERY DAY  . carvedilol (COREG) 12.5 MG tablet TAKE 1  TABLET(12.5 MG) BY MOUTH TWICE DAILY WITH A MEAL  . dapagliflozin propanediol (FARXIGA) 10 MG TABS tablet Take 1 tablet (10 mg total) by mouth daily before breakfast.  . Dextran 70-Hypromellose, PF, (TEARS NATURALE FREE) 0.1-0.3 % SOLN Place 1 drop into both eyes daily as needed (Dry eyes).  . DULoxetine (CYMBALTA) 30 MG capsule Take 90 mg by mouth daily.   . entecavir (BARACLUDE) 0.5 MG tablet Take 1 tablet (0.5 mg total) by mouth every other day. Please note: Every OTHER day  . ezetimibe (ZETIA) 10 MG tablet TAKE 1 TABLET(10 MG) BY MOUTH DAILY  . ferrous sulfate 325 (65 FE) MG tablet Take 325 mg by mouth daily with breakfast.  . fluticasone (FLONASE) 50 MCG/ACT nasal spray SHAKE LIQUID AND USE 1 SPRAY IN EACH NOSTRIL DAILY  . furosemide (LASIX) 40 MG tablet Take 2 tablets by mouth for 5 days then go back to 1 tablet by mouth daily  . hydrALAZINE (APRESOLINE) 50 MG tablet TAKE 1 TABLET(50 MG) BY MOUTH TWICE DAILY  . ondansetron (ZOFRAN-ODT) 8 MG disintegrating tablet Take   8 mg by mouth 2 (two) times daily as needed.  Marland Kitchen oxybutynin (DITROPAN) 5 MG tablet Take 5 mg by mouth 3 (three) times daily.  . polyethylene glycol (MIRALAX) 17 g packet Take as directed, Daily, titrate as needed  . sacubitril-valsartan (ENTRESTO) 49-51 MG Take 1 tablet by mouth daily.  . sildenafil (REVATIO) 20 MG tablet Take 2-5 tablets by mouth once daily as needed prior to sexual activity.  . SYMBICORT 160-4.5 MCG/ACT inhaler INHALE 2 PUFFS INTO THE LUNGS TWICE DAILY  . tamsulosin (FLOMAX) 0.4 MG CAPS capsule Take 0.4 mg by mouth 2 (two) times daily.  Alveda Reasons 20 MG TABS tablet TAKE 1 TABLET(20 MG) BY MOUTH DAILY WITH SUPPER       Allergies:   Prochlorperazine, Codeine, and Hydrocodone-acetaminophen   Social History   Socioeconomic History  . Marital status: Divorced    Spouse name: Not on file  . Number of children: 2  . Years of education: Not on file  . Highest education level: Not on file  Occupational  History  . Occupation: Merchandiser, retail: UNEMPLOYED    Comment: Writer in the past  Tobacco Use  . Smoking status: Former Smoker    Packs/day: 0.50    Years: 0.50    Pack years: 0.25    Types: Cigarettes    Quit date: 05/02/2010    Years since quitting: 10.3  . Smokeless tobacco: Never Used  . Tobacco comment: Significant Second-hand and only 3 months himself  Vaping Use  . Vaping Use: Never used  Substance and Sexual Activity  . Alcohol use: Not Currently    Alcohol/week: 0.0 standard drinks    Comment: rare  . Drug use: No    Comment: Reported history of cocaine abuse off and on   . Sexual activity: Not on file  Other Topics Concern  . Not on file  Social History Narrative   Living with a son who is a 8 year old.  He is not     working, unemployed for 3 years.  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  Denied use of alcohol.  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       [facility]:   Originally from [place], [place]. He grew up in [place]. He moved to [place] in 1985. 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 No pets currently. No bird exposure. He did have mold in a previous home. No hot tub exposure.         Social Determinants of Health   Financial Resource Strain: Not on file  Food Insecurity: Not on file  Transportation Needs: Not on file  Physical Activity: Not on file  Stress: Not on file  Social Connections: Not on file     Family History:  The patient's family history includes Breast cancer in his mother; Cerebral palsy in his daughter; Diabetes in his father; Multiple myeloma in his mother; Peripheral vascular disease in his father; Prostate cancer in his father. There is no history of Lung disease, Colon cancer, Stomach cancer, Esophageal cancer, or Colon polyps.  ROS:   Please see the history of present illness.  All other systems are reviewed and otherwise  negative.    EKGs/Labs/Other Studies Reviewed:    Studies reviewed are outlined and summarized above. Reports included below if pertinent.  2D Echo 05/2020    1. Left ventricular ejection  fraction, by estimation, is 25 to 30%. The  left ventricle has severely decreased function. The left ventricle  demonstrates regional wall motion abnormalities (see scoring  diagram/findings for description). There is moderate  left ventricular hypertrophy. Left ventricular diastolic parameters are  consistent with Grade II diastolic dysfunction (pseudonormalization).  2. Right ventricular systolic function is normal. The right ventricular  size is normal. There is normal pulmonary artery systolic pressure.  3. Left atrial size was severely dilated.  4. Right atrial size was mildly dilated.  5. The mitral valve is normal in structure. No evidence of mitral valve  regurgitation. No evidence of mitral stenosis.  6. The aortic valve is normal in structure. Aortic valve regurgitation is  not visualized. No aortic stenosis is present.  7. The inferior vena cava is normal in size with greater than 50%  respiratory variability, suggesting right atrial pressure of 3 mmHg.   Comparison(s): No significant change from prior study. Prior images  reviewed side by side.   Cath scan from 2011  2D echo 04/2019   1. Left ventricular ejection fraction, by visual estimation, is 30 to  35%. The left ventricle has severely decreased function. Apex not  well-visualized. There is moderately increased left ventricular  hypertrophy.  2. The left ventricle demonstrates global hypokinesis.  3. The average left ventricular global longitudinal strain is -9.1 %.  4. Moderate to severely dilated left ventricular internal cavity size.  5. Left ventricular diastolic parameters are consistent with Grade II  diastolic dysfunction (pseudonormalization).  6. Elevated left atrial pressure.  7. Global right  ventricle has normal systolic function.The right  ventricular size is normal. No increase in right ventricular wall  thickness.  8. Left atrial size was severely dilated.  9. Right atrial size was normal.  10. Trivial pericardial effusion is present.  11. The mitral valve is normal in structure. No evidence of mitral valve  regurgitation.  12. The tricuspid valve is normal in structure. Tricuspid valve  regurgitation is not demonstrated.  13. The aortic valve is tricuspid. Aortic valve regurgitation is not  visualized. No evidence of aortic valve sclerosis or stenosis.  14. The pulmonic valve was not well visualized. Pulmonic valve  regurgitation is not visualized.     EKG:  EKG is ordered today, personally reviewed, demonstrating atrial fib 120bpm, prior anterior infarct, LAD, nonspecific STTW changes, NSIVCD - similar morphology to prior except now AF  Recent Labs: 05/08/2020: Hemoglobin 18.1; NT-Pro BNP 1,120; Platelets 214; TSH 1.950 06/19/2020: ALT 9 07/03/2020: BUN 40; Creatinine, Ser 2.17; Potassium 3.7; Sodium 143  Recent Lipid Panel    Component Value Date/Time   CHOL 196 05/08/2020 1332   TRIG 135 05/08/2020 1332   HDL 33 (L) 05/08/2020 1332   CHOLHDL 5.9 (H) 05/08/2020 1332   CHOLHDL 3.9 10/12/2015 1026   VLDL 27 10/12/2015 1026   LDLCALC 138 (H) 05/08/2020 1332    PHYSICAL EXAM:    VS:  BP 100/60   Pulse 77   Ht 6' 3" (1.905 m)   Wt 278 lb (126.1 kg)   SpO2 97%   BMI 34.75 kg/m   BMI: Body mass index is 34.75 kg/m.  GEN: Well nourished, well developed male in no acute distress HEENT: normocephalic, atraumatic Neck: no JVD, carotid bruits, or masses Cardiac: irregularly irregular, rate variable; no murmurs, rubs, or gallops, no edema  Respiratory:  clear to auscultation bilaterally, normal work of breathing GI: soft, nontender, nondistended, + BS MS: no deformity or atrophy Skin: warm  and dry, no rash Neuro:  Alert and Oriented x 3, Strength and sensation  are intact, follows commands Psych: euthymic mood, full affect  Wt Readings from Last 3 Encounters:  09/15/20 278 lb (126.1 kg)  09/15/20 278 lb (126.1 kg)  06/19/20 283 lb (128.4 kg)     ASSESSMENT & PLAN:   1. Persistent atrial fibrillation - he is back in atrial fib today and feeling poorly. HR was 106 earlier at neurology office as well. When asked if he missed any doses of Xarelto he states "it's entirely possible." He does report he makes a very conscious effort to use his pill box with his wife's help but one day recently he accidentally dropped all of his pills out of the pill box and he had to arrange them all back to the best of his ability. I discussed his case with Dr. Taylor who recommended to increase his amiodarone to 200mg BID and plan TEE/DCCV after at least 2 weeks of the increased amiodarone load. TEE preferred with cardioversion due to concern for possible lapse in med compliance. I did stress the utmost importance of NOT MISSING A SINGLE DOSE going forward. He verbalized understanding. Will also check CMET, CBC, Mg, thyroid today. He will f/u afib clinic after TEE/DCCV. He does have h/o cirrhosis per notes but previously tolerated TEE/DCCV without adverse effect. Otherwise continue carvedilol as discussed. See below re: consent. ADDENDUM: post visit it was noted the patient has not had a recent device interrogation since 04/2020. Will reach out to EP team to see if they can investigate this as it may infer decisions about whether his recent onset of atrial fib is paroxysmal or persistent.  2. Chronic systolic CHF/NICM - clinically appears generally euvolemic on exam. He does not know how he is taking his Entresto. We will have him stop this for now until he can clarify the dose. We also reminded him to keep his Lasix at 40mg once daily (this was previously briefly titrated which he may be confusing for the Entresto). We will check in with him when we get his lab results back.  Consideration could be given to restarting at a lower dose and instead holding/decreasing amlodipine if Cr stable. Otherwise continue present regimen as outlined. Check CrCl today.  3. Essential HTN - with low-normal BP today. Follow with med changes above.  4. CAD with HLD goal LDL <70 - not on ASA as he is on Xarelto. Not on statin due to liver disease but has been instead maintained on ezetimibe. Check CMET today along with lipid panel with direct LDL - ate around 1pm. He had apparently not been taking ezetimibe during the last check when LDL was elevated.  5. CKD stage IV - recheck labs today.  Disposition: F/u with afib clinic after DCCV.  Shared Decision Making/Informed Consent The risks [stroke, cardiac arrhythmias rarely resulting in the need for a temporary or permanent pacemaker, skin irritation or burns, esophageal damage, perforation (1:10,000 risk), bleeding, pharyngeal hematoma as well as other potential complications associated with conscious sedation including aspiration, arrhythmia, respiratory failure and death], benefits (treatment guidance, restoration of normal sinus rhythm, diagnostic support) and alternatives of a transesophageal echocardiogram guided cardioversion were discussed in detail with Mr. Tabar and he is willing to proceed.   Medication Adjustments/Labs and Tests Ordered: Current medicines are reviewed at length with the patient today.  Concerns regarding medicines are outlined above. Medication changes, Labs and Tests ordered today are summarized above and listed in the Patient   Instructions accessible in Encounters.   Signed, Pavel Gadd N Ashleen Demma, PA-C  09/15/2020 3:50 PM    [facility] [address], [place], [place]  [address] Phone: [phone]; Fax: [phone]  

## 2020-09-15 ENCOUNTER — Ambulatory Visit: Payer: Medicaid Other | Admitting: Neurology

## 2020-09-15 ENCOUNTER — Ambulatory Visit: Payer: Medicaid Other | Admitting: Physician Assistant

## 2020-09-15 ENCOUNTER — Other Ambulatory Visit: Payer: Self-pay

## 2020-09-15 ENCOUNTER — Telehealth: Payer: Self-pay | Admitting: Physician Assistant

## 2020-09-15 ENCOUNTER — Encounter: Payer: Self-pay | Admitting: Neurology

## 2020-09-15 ENCOUNTER — Encounter: Payer: Self-pay | Admitting: Physician Assistant

## 2020-09-15 VITALS — BP 100/60 | HR 77 | Ht 75.0 in | Wt 278.0 lb

## 2020-09-15 VITALS — BP 112/66 | HR 106 | Ht 75.0 in | Wt 278.0 lb

## 2020-09-15 DIAGNOSIS — I428 Other cardiomyopathies: Secondary | ICD-10-CM

## 2020-09-15 DIAGNOSIS — I2510 Atherosclerotic heart disease of native coronary artery without angina pectoris: Secondary | ICD-10-CM | POA: Diagnosis not present

## 2020-09-15 DIAGNOSIS — R519 Headache, unspecified: Secondary | ICD-10-CM | POA: Diagnosis not present

## 2020-09-15 DIAGNOSIS — N184 Chronic kidney disease, stage 4 (severe): Secondary | ICD-10-CM

## 2020-09-15 DIAGNOSIS — E669 Obesity, unspecified: Secondary | ICD-10-CM

## 2020-09-15 DIAGNOSIS — Z789 Other specified health status: Secondary | ICD-10-CM

## 2020-09-15 DIAGNOSIS — G4733 Obstructive sleep apnea (adult) (pediatric): Secondary | ICD-10-CM

## 2020-09-15 DIAGNOSIS — I5022 Chronic systolic (congestive) heart failure: Secondary | ICD-10-CM

## 2020-09-15 DIAGNOSIS — E785 Hyperlipidemia, unspecified: Secondary | ICD-10-CM | POA: Diagnosis not present

## 2020-09-15 DIAGNOSIS — I10 Essential (primary) hypertension: Secondary | ICD-10-CM

## 2020-09-15 DIAGNOSIS — I4819 Other persistent atrial fibrillation: Secondary | ICD-10-CM

## 2020-09-15 DIAGNOSIS — G44209 Tension-type headache, unspecified, not intractable: Secondary | ICD-10-CM

## 2020-09-15 MED ORDER — AMIODARONE HCL 200 MG PO TABS: ORAL_TABLET | ORAL | 3 refills | Status: DC

## 2020-09-15 MED ORDER — FUROSEMIDE 40 MG PO TABS: ORAL_TABLET | ORAL | 3 refills | Status: DC

## 2020-09-15 NOTE — Progress Notes (Signed)
Subjective:    Patient ID: Cody Ortega is a 60 y.o. male.  HPI     Cody Age, MD, PhD [facility] [address], [address] [place], [place] [address]  Dear Dr. Marin Comment,   I saw your patient, Cody Ortega, upon your kind request in my neurologic clinic today for initial consultation of his recurrent headaches including nocturnal headaches.  The patient is unaccompanied today.  As you know, Cody Ortega is a 60 year old right-handed man with an underlying complex medical history of coronary artery disease, cardiomyopathy, chronic systolic congestive heart failure, atrial fibrillation, status post AICD implant in 2012 with generator change out in September 2021, chronic kidney disease, history of hepatitis B, history of substance use, history of liver cirrhosis anxiety, arthritis, asthma, anemia, stroke, thyroid disease, hypertension, hyperlipidemia, sleep apnea, not on CPAP therapy, and obesity, who reports recurrent headaches in the past few months.  He does not have a prior history of headaches or migraines.  His headaches are not debilitating and not one-sided or throbbing, no associated nausea or vomiting but he does have some light sensitivity.  He feels that his headaches got worse as the day progresses and are made worse by stress.  He tries to hydrate well with water but drinks quite a bit of caffeine, at least 4 cans of soda and about 1 serving of tea daily, typically no coffee.  He tries to drink about 3-4 bottles of water per day.  He does not drink any alcohol.  He reports a family history of alcoholism and drug addiction.  He has a remote history of cocaine use.  He is followed by psychiatry for his anxiety and depression and reports fluctuating mood related symptoms, some increase in depressive symptoms from time to time.  He is followed closely by psychiatry and sees Cody Chapel, NP.  I reviewed your office note from 09/03/2020.  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  He is followed by ophthalmology/retina specialist and cardiology.  He is on numerous medications. He had a head CT without contrast as well as cervical spine CT without contrast on 07/19/2010 and I was able to review the results:  IMPRESSION:  No acute bony abnormality of the cervical spine.   Cannot exclude a disc bulge at C4-5.  Appearances are similar  compared to prior study of 2006.   He had a sleep study several years ago, he is not sure how severe his sleep apnea was but he tried CPAP and could not tolerate it.  He would be willing to try a nasal interface but reports that he would not be able to use a facemask.  He is trying to lose weight.  He is trying to stay active.  He lives with his girlfriend.  He had prescription eyeglasses some years ago but these caused headaches.  His current headaches are intermittent, not long-lasting, are not associated with any neurological accompaniments.  He has a history of stroke some 6 years ago.   He reports a fall last week but did not injure himself.  He does not sleep well and does not keep a very set schedule, he may be in bed anywhere between 3 AM and 4:30 AM, rise time can vary between 1:30 PM to 5 PM.  Epworth sleepiness score is 7 out of 24, fatigue severity score is 46 out of 63.  He reports chronic low back pain and herniated disc in the lower back in the 90s.  His Past Medical History Is Significant For: Past Medical History:  Diagnosis Date  . Anxiety   . Arthritis   . Benzodiazepine dependence ([place])   . Bronchial asthma   . CAD (coronary artery disease)    a. Cath 03/2010: mod RCA stenosis, severe diag stenosis, treated medically given lack of angina.  . 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 Chronic systolic congestive heart failure ([place])   . Cirrhosis ([place])    secondary to hepatitis B  . CKD  (chronic kidney disease), stage IV (HCC)    Stage 3-4  . Depression   . Diverticulosis   . Hepatitis B   . History of cocaine abuse ([place])   . Hyperlipidemia LDL goal <70   . Hypertension   . Microcytic anemia   . Persistent atrial fibrillation ([place])    a. Episode in 2008 with spontaneous conversion, on amiodarone per EP.  . S/P implantation of automatic cardioverter/defibrillator (AICD)    a. Medtronic, implanted 2012.  . Sleep apnea   . Stroke [facility]) 2004  . Thyroid disease     His Past Surgical History Is Significant For: Past Surgical History:  Procedure Laterality Date  . CARDIOVERSION N/A 01/07/2014   Procedure: CARDIOVERSION;  Surgeon: Lelon Perla, MD;  Location: [facility] ENDOSCOPY;  Service: Cardiovascular;  Laterality: N/A;  . CARDIOVERSION N/A 02/11/2014   Procedure: CARDIOVERSION;  Surgeon: Pixie Casino, MD;  Location: [place];  Service: Cardiovascular;  Laterality: N/A;  . COLONOSCOPY WITH PROPOFOL N/A 02/06/2018   Procedure: COLONOSCOPY WITH PROPOFOL;  Surgeon: Yetta Flock, MD;  Location: [facility] ENDOSCOPY;  Service: Gastroenterology;  Laterality: N/A;  . ICD GENERATOR CHANGEOUT N/A 01/17/2020   Procedure: [place];  Surgeon: Evans Lance, MD;  Location: [facility] CV LAB;  Service: Cardiovascular;  Laterality: N/A;  . LASEK eye surgery Bilateral 05/2018  . LASIK Bilateral   . PACEMAKER INSERTION    . POLYPECTOMY  02/06/2018   Procedure: POLYPECTOMY;  Surgeon: Yetta Flock, MD;  Location: [facility] ENDOSCOPY;  Service: Gastroenterology;;  . TEE WITHOUT CARDIOVERSION N/A 01/07/2014   Procedure: TRANSESOPHAGEAL ECHOCARDIOGRAM (TEE);  Surgeon: Lelon Perla, MD;  Location: [facility] ENDOSCOPY;  Service: Cardiovascular;  Laterality: N/A;  . TEE WITHOUT CARDIOVERSION N/A 02/11/2014   Procedure: TRANSESOPHAGEAL ECHOCARDIOGRAM (TEE);  Surgeon: Pixie Casino, MD;  Location: [facility] ENDOSCOPY;  Service: Cardiovascular;  Laterality: N/A;  . TONSILLECTOMY     . TRANSTHORACIC ECHOCARDIOGRAM  10/2006, 12/2009    His Family History Is Significant For: Family History  Problem Relation Ortega of Onset  . Breast cancer Mother   . Multiple myeloma Mother   . Prostate cancer Father   . Diabetes Father   . Peripheral vascular disease Father   . Cerebral palsy Daughter   . Lung disease Neg Hx   . Colon cancer Neg Hx   . Stomach cancer Neg Hx   . Esophageal cancer Neg Hx   . Colon polyps Neg Hx     His Social History Is Significant For: Social History   Socioeconomic History  . Marital status: Divorced    Spouse name: Not on file  . Number of children: 2  . Years of education: Not on file  . Highest education level: Not on file  Occupational History  . Occupation: Merchandiser, retail: UNEMPLOYED    Comment: Writer in the  past  Tobacco Use  . Smoking status: Former Smoker    Packs/day: 0.50    Years: 0.50    Pack years: 0.25    Types: Cigarettes    Quit date: 05/02/2010    Years since quitting: 10.3  . Smokeless tobacco: Never Used  . Tobacco comment: Significant Second-hand and only 3 months himself  Vaping Use  . Vaping Use: Never used  Substance and Sexual Activity  . Alcohol use: Not Currently    Alcohol/week: 0.0 standard drinks    Comment: rare  . Drug use: No    Comment: Reported history of cocaine abuse off and on   . Sexual activity: Not on file  Other Topics Concern  . Not on file  Social History Narrative   Living with a son who is a 37 year old.  He is not     working, unemployed for 3 years.  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  Denied use of alcohol.  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       [place]:   Originally from [place], [place]. He grew up in [place]. He moved to [place] in 1985. 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 No pets currently. No bird exposure. He did have mold in a previous home. No hot tub  exposure.         Social Determinants of Health   Financial Resource Strain: Not on file  Food Insecurity: Not on file  Transportation Needs: Not on file  Physical Activity: Not on file  Stress: Not on file  Social Connections: Not on file    His Allergies Are:  Allergies  Allergen Reactions  . Codeine Itching, Nausea And Vomiting and Hives  . Hydrocodone-Acetaminophen Itching and Nausea And Vomiting  :   His Current Medications Are:  Outpatient Encounter Medications as of 09/15/2020  Medication Sig  . albuterol (VENTOLIN HFA) 108 (90 Base) MCG/ACT inhaler INHALE 2 PUFFS INTO THE LUNGS EVERY 6 HOURS AS NEEDED FOR WHEEZING OR SHORTNESS OF BREATH  . alprazolam (XANAX) 2 MG tablet Take 2 mg by mouth every 6 (six) hours as needed for anxiety.  Marland Kitchen amiodarone (PACERONE) 200 MG tablet Take one tablet by mouth daily Monday through Saturday.  Do NOT take on Sunday.  Marland Kitchen amLODipine (NORVASC) 10 MG tablet TAKE 1 TABLET BY MOUTH EVERY DAY  . carvedilol (COREG) 12.5 MG tablet TAKE 1 TABLET(12.5 MG) BY MOUTH TWICE DAILY WITH A MEAL  . dapagliflozin propanediol (FARXIGA) 10 MG TABS tablet Take 1 tablet (10 mg total) by mouth daily before breakfast.  . Dextran 70-Hypromellose, PF, (TEARS NATURALE FREE) 0.1-0.3 % SOLN Place 1 drop into both eyes daily as needed (Dry eyes).  . DULoxetine (CYMBALTA) 30 MG capsule Take 90 mg by mouth daily.   Marland Kitchen entecavir (BARACLUDE) 0.5 MG tablet Take 1 tablet (0.5 mg total) by mouth every other day. Please note: Every OTHER day  . ezetimibe (ZETIA) 10 MG tablet TAKE 1 TABLET(10 MG) BY MOUTH DAILY  . ferrous sulfate 325 (65 FE) MG tablet Take 325 mg by mouth daily with breakfast.  . fluticasone (FLONASE) 50 MCG/ACT nasal spray SHAKE LIQUID AND USE 1 SPRAY IN EACH NOSTRIL DAILY  . furosemide (LASIX) 40 MG tablet Take 2 tablets by mouth for 5 days then go back to 1 tablet by mouth daily  . hydrALAZINE (APRESOLINE) 50 MG tablet TAKE  1 TABLET(50 MG) BY MOUTH TWICE DAILY  .  Olopatadine HCl 0.2 % SOLN Apply 1 drop to eye daily.  . ondansetron (ZOFRAN-ODT) 8 MG disintegrating tablet Take 8 mg by mouth 2 (two) times daily as needed.  Marland Kitchen oxybutynin (DITROPAN) 5 MG tablet Take 5 mg by mouth 3 (three) times daily.  . polyethylene glycol (MIRALAX) 17 g packet Take as directed, Daily, titrate as needed  . sacubitril-valsartan (ENTRESTO) 49-51 MG Take 1 tablet by mouth 2 (two) times daily.  . sildenafil (REVATIO) 20 MG tablet Take 2-5 tablets by mouth once daily as needed prior to sexual activity.  . SYMBICORT 160-4.5 MCG/ACT inhaler INHALE 2 PUFFS INTO THE LUNGS TWICE DAILY  . tamsulosin (FLOMAX) 0.4 MG CAPS capsule Take 0.4 mg by mouth 2 (two) times daily.  Alveda Reasons 20 MG TABS tablet TAKE 1 TABLET(20 MG) BY MOUTH DAILY WITH SUPPER  . [DISCONTINUED] cycloSPORINE (RESTASIS) 0.05 % ophthalmic emulsion Place 1 drop into both eyes 2 (two) times daily.   No facility-administered encounter medications on file as of 09/15/2020.  :   Review of Systems:  Out of a complete 14 point review of systems, all are reviewed and negative with the exception of these symptoms as listed below:  Review of Systems  Neurological:       Here to discuss headaches, reports daily headaches in the evening, light sensitivity, states prochlorperazine 5mg  made headaches and vision problems worse, med D/C by Dr Dorethea Clan, not on cpap due to mask issues  Epworth Sleepiness Scale 0= would never doze 1= slight chance of dozing 2= moderate chance of dozing 3= high chance of dozing  Sitting and reading:0 Watching TV:1 Sitting inactive in a public place (ex. Theater or meeting):1 As a passenger in a car for an hour without a break:0 Lying down to rest in the afternoon:2 Sitting and talking to someone:0 Sitting quietly after lunch (no alcohol):2 In a car, while stopped in traffic:1 Total:7     Objective:  Neurological Exam  Physical Exam  Physical Examination:   Vitals:   09/15/20 1014   BP: 112/66  Pulse: (!) 106    General Examination: The patient is a very pleasant 60 y.o. male in no acute distress. He appears well-developed and well-nourished and well groomed.   HEENT: Normocephalic, atraumatic, pupils are equal, round and reactive to light and accommodation. Funduscopic exam is normal with sharp disc margins noted. Extraocular tracking is good without limitation to gaze excursion or nystagmus noted. Normal smooth pursuit is noted. Hearing is grossly intact. Face is symmetric with normal facial animation and normal facial sensation. Speech is clear with no dysarthria noted. There is no hypophonia. There is no lip, neck/head, jaw or voice tremor. Neck is supple with full range of passive and active motion. There are no carotid bruits on auscultation. Oropharynx exam reveals: moderate mouth dryness, moderate airway crowding, due to redundant soft palate and prominent uvula, tongue protrudes centrally and palate elevates symmetrically.  Tonsils are absent.  Neck circumference is 18-1/4 inches.. Mallampati is class III. Tongue protrudes centrally and palate elevates symmetrically.  He has a Band-Aid over his nasal bridge.  He reports that this helps him with his snoring at night.  Chest: Clear to auscultation without wheezing, rhonchi or crackles noted.  Heart: S1+S2+0, regular and normal without murmurs, rubs or gallops noted.   Abdomen: Soft, non-tender and non-distended with normal bowel sounds appreciated on auscultation.  Extremities: There is no pitting edema in the distal lower extremities  bilaterally. Pedal pulses are intact.  Skin: Warm and dry without trophic changes noted.  Musculoskeletal: exam reveals no obvious joint deformities, tenderness or joint swelling or erythema.   Neurologically:  Mental status: The patient is awake, alert and oriented in all 4 spheres. His immediate and remote memory, attention, language skills and fund of knowledge are appropriate.  There is no evidence of aphasia, agnosia, apraxia or anomia. Speech is clear with normal prosody and enunciation. Thought process is linear. Mood is normal and affect is normal.  Cranial nerves II - XII are as described above under HEENT exam. In addition: shoulder shrug is normal with equal shoulder height noted. Motor exam: Normal bulk, strength and tone is noted. There is no drift, tremor or rebound. Romberg is negative. Reflexes are 1+ in the upper and lower extremities. Babinski: Toes are flexor bilaterally. Fine motor skills and coordination: intact with normal finger taps, normal hand movements, normal rapid alternating patting, normal foot taps and normal foot agility.  Cerebellar testing: No dysmetria or intention tremor on finger to nose testing. Heel to shin is unremarkable bilaterally. There is no truncal or gait ataxia.  Sensory exam: intact to light touch, vibration, temperature sense in the upper and lower extremities.  Gait, station and balance: He stands easily. No veering to one side is noted. No leaning to one side is noted. Posture is Ortega-appropriate and stance is narrow based. Gait shows normal stride length and normal pace. No problems turning are noted. Tandem walk is unremarkable.         Assessment and Plan:   In summary, Cody Ortega is a very pleasant 60 y.o.-year old male with an underlying complex medical history of coronary artery disease, cardiomyopathy, chronic systolic congestive heart failure, atrial fibrillation, status post AICD implant in 2012 with generator change out in September 2021, chronic kidney disease, history of hepatitis B, history of substance use, history of liver cirrhosis anxiety, arthritis, asthma, anemia, stroke, thyroid disease, hypertension, hyperlipidemia, sleep apnea, not on CPAP therapy, and obesity, who presents for evaluation of his recurrent headaches for the past 3 months.  His headache description is not in keeping with cluster headaches,  temporal arteritis, or migraines.  Neurological exam is nonfocal, and he is largely reassured in that regard.  We talked about headache triggers.  He feels that he has had stress headaches.  Underlying tension headache is certainly a possibility, additionally, sleep deprivation, caffeine overuse, medication side effects, and underlying sleep apnea or possible and likely contributors.  His headaches are not severe or debilitating.  He is advised to work on a set schedule for his sleep and limiting his caffeine.  We will proceed with a head CT without contrast, unfortunately, he cannot have an MRI due to his AICD.  Given that there is a shortage of IV CT contrast and indication for contrasted CT scans are limited to aortic dissection, carotid dissection, stroke, cancer staging, we will have to proceed with a CT scan without contrast at this time.  He is agreeable to this.  We will do some blood work but he would like to get it drawn through his cardiology clinic as he has needle phobia and he is well-known to the cardiology phlebotomist.  He would like to get a printed order for his blood test.  He is advised to consider CPAP or AutoPap therapy if he still has obstructive sleep apnea, he is reluctant to try a face mask.  We talked a little bit about inspire  but he is advised that it is not considered first-line treatment for sleep apnea.  We could try a nasal interface and he would be willing to give this a try if the need arises.  We will keep him posted as to his CT scan results and his sleep study results and plan a follow-up afterwards. I answered all his questions today and he was in agreement with the plan.   Thank you very much for allowing me to participate in the care of this nice patient. If I can be of any further assistance to you please do not hesitate to call me at [phone].  Sincerely,   Cody Age, MD, PhD

## 2020-09-15 NOTE — Telephone Encounter (Signed)
   Hi EP team! I saw Cody Ortega in clinic today in follow-up at which time he was back in atrial fib and feeling poorly. Per discussion with Dr. Lovena Le, he recommended to increase amiodarone to 200mg  BID and plan TEE/DCCV at least 2-3 weeks away. We got this set up. I was digging further into his chart post-visit and saw his last ICD interrogation was 04/2020 - is there any way we can get an updated transmission to see if this is paroxysmal atrial fib or persistent since a certain period of time? Thanks! Candance Bohlman

## 2020-09-15 NOTE — Telephone Encounter (Signed)
He has a single chamber device so you will not be able to assess for atrial fib.

## 2020-09-15 NOTE — Patient Instructions (Signed)
Please remember, common headache triggers are: sleep deprivation, dehydration, overheating, stress, hypoglycemia or skipping meals and blood sugar fluctuations, excessive pain medications or excessive alcohol use or caffeine withdrawal. Some people have food triggers such as aged cheese, orange juice or chocolate, especially dark chocolate, or MSG (monosodium glutamate). Try to avoid these headache triggers as much possible. It may be helpful to keep a headache diary to figure out what makes your headaches worse or brings them on and what alleviates them. Some people report headache onset after exercise but studies have shown that regular exercise may actually prevent headaches from coming. If you have exercise-induced headaches, please make sure that you drink plenty of fluid before and after exercising and that you do not over do it and do not overheat.  Your headache does not sound like a migraine.  It could be related to stress, sleep deprivation, underlying sleep apnea, caffeine overuse.  Please try to hydrate well with water and reduce your caffeine intake to limit yourself to a total of 1 or 2 servings per day.  We will proceed with a sleep study to reevaluate your sleep apnea and consider treatment such as an AutoPap machine.  Please continue to work on weight loss.  We will proceed with a head CT without contrast, there is a global shortage of IV contrast for CT scans and the contrast is restricted for certain indications only.  Unfortunately, we cannot proceed with an MRI because you have a defibrillator in place.  We will call you with your CT scan results.  We will also call you with your sleep study results.

## 2020-09-15 NOTE — Patient Instructions (Signed)
Medication Instructions:  Your physician has recommended you make the following change in your medication:   1.  Hold Entresto till we clarify the dose on your bottle.  Let us knows when we call with your labs.  2.  Increase Amiodarone one tablet by mouth ( 200 mg) twice daily, sent in # 90 to requested pharmacy.   *If you need a refill on your cardiac medications before your next appointment, please call your pharmacy*   Lab Work: TODAY:  CMET/MAG/LIPID/CBC/TSH/T4/DIRECT LDL  If you have labs (blood work) drawn today and your tests are completely normal, you will receive your results only by: Marland Kitchen MyChart Message (if you have MyChart) OR . A paper copy in the mail If you have any lab test that is abnormal or we need to change your treatment, we will call you to review the results.   Testing/Procedures: Your provider has recommended a cardioversion.   You are scheduled for a cardioversion/TEE  On Thursday, June 2 at 9:30 am  with Dr. Gwendolyn Fill or associates. Please go to [facility] ([address] 2nd [place] Stay at 8:00 am.  There is free valet parking available.  Enter through the [facility] not have any food or drink after midnight on Wednesday, June 1.  You may take your medicines with a sip of water on the day of your procedure.   Hold the following medicines lasix the am of cardioversion/tee  DO NOT STOP YOUR ANTICOAGULANT (BLOOD THINNER) xarelto - YOU WILL NEED TO CONTINUE YOUR ANTICOAGULANT AFTER YOUR PROCEDURE UNTIL YOU ARE TOLD BY YOUR PROVIDER IT IS SAFE TO STOP  You will need someone to drive you home following your procedure and stay in the waiting room during your procedure. Failure to do so could result in your procedure being cancelled.   Every effort is made to have your procedure done on time. Occasionally there are emergencies that occur at the hospital that may cause delays.   Call the [place] office  at [phone] if you have any questions, problems or concerns.     Electrical Cardioversion Electrical cardioversion is the delivery of a jolt of electricity to change the rhythm of the heart. Sticky patches or metal paddles are placed on the chest to deliver the electricity from a device. This is done to restore a normal rhythm. A rhythm that is too fast or not regular keeps the heart from pumping well. Electrical cardioversion is done in an emergency if:   There is low or no blood pressure as a result of the heart rhythm.    Normal rhythm must be restored as fast as possible to protect the brain and heart from further damage.    It may save a life. Cardioversion may be done for heart rhythms that are not immediately life threatening, such as atrial fibrillation or flutter, in which:   The heart is beating too fast or is not regular.    Medicine to change the rhythm has not worked.    It is safe to wait in order to allow time for preparation.  Symptoms of the abnormal rhythm are bothersome.  The risk of stroke and other serious problems can be reduced.  LET [facility] CARE PROVIDER KNOW ABOUT:   Any allergies you have.  All medicines you are taking, including vitamins, herbs, eye drops, creams, and over-the-counter medicines.  Previous problems you or members of your family have had with the  use of anesthetics.    Any blood disorders you have.    Previous surgeries you have had.    Medical conditions you have.   RISKS AND COMPLICATIONS  Generally, this is a safe procedure. However, problems can occur and include:   Breathing problems related to the anesthetic used.  A blood clot that breaks free and travels to other parts of your body. This could cause a stroke or other problems. The risk of this is lowered by use of blood-thinning medicine (anticoagulant) prior to the procedure.  Cardiac arrest (rare).   BEFORE THE PROCEDURE   You may have tests to detect  blood clots in your heart and to evaluate heart function.   You may start taking anticoagulants so your blood does not clot as easily.    Medicines may be given to help stabilize your heart rate and rhythm.   PROCEDURE  You will be given medicine through an IV tube to reduce discomfort and make you sleepy (sedative).    An electrical shock will be delivered.   AFTER THE PROCEDURE Your heart rhythm will be watched to make sure it does not change. You will need someone to drive you home.       Follow-Up: At [facility], you and your health needs are our priority.  As part of our continuing mission to provide you with exceptional heart care, we have created designated Provider Care Teams.  These Care Teams include your primary Cardiologist (physician) and Advanced Practice Providers (APPs -  Physician Assistants and Nurse Practitioners) who all work together to provide you with the care you need, when you need it.  We recommend signing up for the patient portal called "MyChart".  Sign up information is provided on this After Visit Summary.  MyChart is used to connect with patients for Virtual Visits (Telemedicine).  Patients are able to view lab/test results, encounter notes, upcoming appointments, etc.  Non-urgent messages can be sent to your provider as well.   To learn more about what you can do with MyChart, go to [email or web address].    Your next appointment:   You have been referred to [facility] clinic. Thursday, June 9 @ 11:30.  See attached paperwork.   Other Instructions None

## 2020-09-15 NOTE — Addendum Note (Signed)
Addended by: Charlie Pitter on: 09/15/2020 05:40 PM   Modules accepted: Orders, SmartSet

## 2020-09-16 ENCOUNTER — Telehealth: Payer: Self-pay | Admitting: Neurology

## 2020-09-16 ENCOUNTER — Other Ambulatory Visit: Payer: Self-pay | Admitting: *Deleted

## 2020-09-16 DIAGNOSIS — N184 Chronic kidney disease, stage 4 (severe): Secondary | ICD-10-CM

## 2020-09-16 LAB — COMPREHENSIVE METABOLIC PANEL
ALT: 8 IU/L (ref 0–44)
AST: 10 IU/L (ref 0–40)
Albumin/Globulin Ratio: 1.7 (ref 1.2–2.2)
Albumin: 4.5 g/dL (ref 3.8–4.9)
Alkaline Phosphatase: 71 IU/L (ref 44–121)
BUN/Creatinine Ratio: 11 (ref 9–20)
BUN: 30 mg/dL — ABNORMAL HIGH (ref 6–24)
Bilirubin Total: 0.6 mg/dL (ref 0.0–1.2)
CO2: 24 mmol/L (ref 20–29)
Calcium: 9.6 mg/dL (ref 8.7–10.2)
Chloride: 100 mmol/L (ref 96–106)
Creatinine, Ser: 2.74 mg/dL — ABNORMAL HIGH (ref 0.76–1.27)
Globulin, Total: 2.6 g/dL (ref 1.5–4.5)
Glucose: 96 mg/dL (ref 65–99)
Potassium: 4.4 mmol/L (ref 3.5–5.2)
Sodium: 140 mmol/L (ref 134–144)
Total Protein: 7.1 g/dL (ref 6.0–8.5)
eGFR: 26 mL/min/{1.73_m2} — ABNORMAL LOW (ref >59)

## 2020-09-16 LAB — TSH+FREE T4
Free T4: 1.59 ng/dL (ref 0.82–1.77)
TSH: 2.39 u[IU]/mL (ref 0.450–4.500)

## 2020-09-16 LAB — MAGNESIUM: Magnesium: 2.3 mg/dL (ref 1.6–2.3)

## 2020-09-16 LAB — LDL CHOLESTEROL, DIRECT: LDL Direct: 102 mg/dL — ABNORMAL HIGH (ref 0–99)

## 2020-09-16 LAB — CBC
Hematocrit: 48.7 % (ref 37.5–51.0)
Hemoglobin: 16.6 g/dL (ref 13.0–17.7)
MCH: 31.2 pg (ref 26.6–33.0)
MCHC: 34.1 g/dL (ref 31.5–35.7)
MCV: 92 fL (ref 79–97)
Platelets: 222 10*3/uL (ref 150–450)
RBC: 5.32 x10E6/uL (ref 4.14–5.80)
RDW: 14.1 % (ref 11.6–15.4)
WBC: 8.5 10*3/uL (ref 3.4–10.8)

## 2020-09-16 NOTE — Telephone Encounter (Addendum)
CT auth: [facility] medicaid community # [id number] (exp. 09/16/20-10/31/20)  Sent to GI for scheduling.

## 2020-09-16 NOTE — Telephone Encounter (Signed)
Noted. Thanks.

## 2020-09-20 DIAGNOSIS — H5213 Myopia, bilateral: Secondary | ICD-10-CM | POA: Diagnosis not present

## 2020-09-22 ENCOUNTER — Telehealth: Payer: Self-pay | Admitting: Physician Assistant

## 2020-09-22 NOTE — Telephone Encounter (Signed)
Pt c/o medication issue:  1. Name of Medication:   sacubitril-valsartan (ENTRESTO) 49-51 MG    2. How are you currently taking this medication (dosage and times per day)? Not currently taking   3. Are you having a reaction (difficulty breathing--STAT)? No   4. What is your medication issue? Cody Ortega is calling stating he is not currently taking his [name] due to [name] advising him to wait until his lab results came in. He is aware he is scheduled for another lab appointment tomorrow 09/23/20, but wanted to confirm he should still be holding his Entresto until after those lab results come in as well. Please advise.

## 2020-09-22 NOTE — Telephone Encounter (Signed)
Returned call to pt and he has been made aware, per Melina Copa, PA-C's note in the lab results, that pt is to STAY OFF Entresto.  Pt was thankful for the call back.

## 2020-09-23 ENCOUNTER — Other Ambulatory Visit: Payer: Self-pay

## 2020-09-23 ENCOUNTER — Other Ambulatory Visit: Payer: Medicaid Other | Admitting: *Deleted

## 2020-09-23 DIAGNOSIS — N184 Chronic kidney disease, stage 4 (severe): Secondary | ICD-10-CM

## 2020-09-23 LAB — BASIC METABOLIC PANEL
BUN/Creatinine Ratio: 15 (ref 9–20)
BUN: 37 mg/dL — ABNORMAL HIGH (ref 6–24)
CO2: 25 mmol/L (ref 20–29)
Calcium: 9.2 mg/dL (ref 8.7–10.2)
Chloride: 99 mmol/L (ref 96–106)
Creatinine, Ser: 2.55 mg/dL — ABNORMAL HIGH (ref 0.76–1.27)
Glucose: 103 mg/dL — ABNORMAL HIGH (ref 65–99)
Potassium: 3.9 mmol/L (ref 3.5–5.2)
Sodium: 141 mmol/L (ref 134–144)
eGFR: 29 mL/min/{1.73_m2} — ABNORMAL LOW (ref >59)

## 2020-09-25 ENCOUNTER — Other Ambulatory Visit: Payer: Self-pay

## 2020-09-25 ENCOUNTER — Ambulatory Visit
Admission: RE | Admit: 2020-09-25 | Discharge: 2020-09-25 | Disposition: A | Discharge status: Home / Self Care | Payer: Medicaid Other | Source: Ambulatory Visit | Attending: Neurology | Admitting: Neurology

## 2020-09-25 DIAGNOSIS — E669 Obesity, unspecified: Secondary | ICD-10-CM

## 2020-09-25 DIAGNOSIS — R519 Headache, unspecified: Secondary | ICD-10-CM | POA: Diagnosis not present

## 2020-09-25 DIAGNOSIS — G44209 Tension-type headache, unspecified, not intractable: Secondary | ICD-10-CM

## 2020-09-25 DIAGNOSIS — I5022 Chronic systolic (congestive) heart failure: Secondary | ICD-10-CM

## 2020-09-25 DIAGNOSIS — Z789 Other specified health status: Secondary | ICD-10-CM

## 2020-09-25 DIAGNOSIS — G4733 Obstructive sleep apnea (adult) (pediatric): Secondary | ICD-10-CM

## 2020-09-25 NOTE — Progress Notes (Signed)
Please advise patient that his recent CT head wo contrast showed evidence of chronic changes, including atherosclerosis (ie hardening of the arteries) and an old stroke, which was noted on a CT from 2012 and 2006 even. No acute findings were seen.  We will proceed with sleep study, as discussed.

## 2020-09-29 ENCOUNTER — Telehealth: Payer: Self-pay

## 2020-09-29 DIAGNOSIS — H5203 Hypermetropia, bilateral: Secondary | ICD-10-CM

## 2020-09-29 HISTORY — DX: Hypermetropia, bilateral: H52.03

## 2020-09-29 NOTE — Telephone Encounter (Signed)
Pt called me back and we reviewed results he verbalized understanding and will proceed with sleep study.

## 2020-09-29 NOTE — Telephone Encounter (Signed)
I called pt. No answer, left a message asking pt to call me back.   

## 2020-09-29 NOTE — Telephone Encounter (Signed)
-----   Message from Star Age, MD sent at 09/25/2020  2:17 PM EDT ----- Please advise patient that his recent CT head wo contrast showed evidence of chronic changes, including atherosclerosis (ie hardening of the arteries) and an old stroke, which was noted on a CT from 2012 and 2006 even. No acute findings were seen.  We will proceed with sleep study, as discussed.

## 2020-09-29 NOTE — Progress Notes (Signed)
[facility] Note  09/30/2020     CHIEF COMPLAINT Patient presents for Retina Evaluation   HISTORY OF PRESENT ILLNESS: Cody Ortega is a 60 y.o. male who presents to the clinic today for:   HPI    Retina Evaluation    In both eyes.  This started 1 month ago.  Duration of 1 month.  Context:  distance vision and near vision.  I, the attending physician,  performed the HPI with the patient and updated documentation appropriately.          Comments    Pt states that approximately 1-2 months ago, he was walking out of [place] and noticed that his vision was very blurry--states it looked like he was under water (unsure of which eye).  Pt states vision is back to normal today.  Denies eye pain or discomfort and denies any new or worsening floaters or fol OU.       Last edited by Bernarda Caffey, MD on 10/02/2020 11:21 PM. (History)    pt is back on the referral of Dr. Marin Comment for concern of decreased vision, pt states he was at [facility] and when he tried to leave, his vision "hazed up" like he had "been in chlorine", he states he was unable to find the exit and had to call his girlfriend to come get him, he states he recently got a speeding ticket because his eyes were unable to focus from driving distance to see his dashboard, pt has a new glasses rx from Dr. Marin Comment, but has not gotten new glasses, pt feels like some of the visual changes started around the same time he started taking a new psychiatric medication, pt has since stopped that medication, he feels like his vision is better today, but it has taken about a month for it to get better, Dr. Marin Comment sent pt to have a cat scan with a neurologist, pt states he has an irregular heartbeat right now and is having a TEE tomorrow  Referring physician: Marin Comment [address] [place], [place],  [place] [id number]  HISTORICAL INFORMATION:   Selected notes from the MEDICAL RECORD NUMBER Referred by Dr. [name] for decreased  vision and floaters x3 days    CURRENT MEDICATIONS: Current Outpatient Medications (Ophthalmic Drugs)  Medication Sig  . Dextran 70-Hypromellose, PF, ([facility] NATURAL TEARS) 0.1-0.3 % SOLN Place 1 drop into both eyes daily as needed (dry eyes).   No current facility-administered medications for this visit. (Ophthalmic Drugs)   Current Outpatient Medications (Other)  Medication Sig  . acetaminophen (TYLENOL) 500 MG tablet Take 1,000 mg by mouth every 6 (six) hours as needed for moderate pain or headache.  . albuterol (VENTOLIN HFA) 108 (90 Base) MCG/ACT inhaler INHALE 2 PUFFS INTO THE LUNGS EVERY 6 HOURS AS NEEDED FOR WHEEZING OR SHORTNESS OF BREATH (Patient taking differently: Inhale 2 puffs into the lungs every 6 (six) hours as needed for wheezing or shortness of breath. INHALE 2 PUFFS INTO THE LUNGS EVERY 6 HOURS AS NEEDED FOR WHEEZING OR SHORTNESS OF BREATH)  . alprazolam (XANAX) 2 MG tablet Take 2 mg by mouth every 6 (six) hours as needed for anxiety.  Marland Kitchen amiodarone (PACERONE) 200 MG tablet Take one tablet by mouth ( 200 mg ) twice daily. (Patient taking differently: Take 200 mg by mouth 2 (two) times daily. Take one tablet by mouth ( 200 mg ) twice daily.)  . amLODipine (NORVASC) 10 MG tablet TAKE  1 TABLET BY MOUTH EVERY DAY (Patient taking differently: Take 10 mg by mouth every evening.)  . carvedilol (COREG) 12.5 MG tablet TAKE 1 TABLET(12.5 MG) BY MOUTH TWICE DAILY WITH A MEAL (Patient taking differently: Take 12.5 mg by mouth 2 (two) times daily with a meal. TAKE 1 TABLET(12.5 MG) BY MOUTH TWICE DAILY WITH A MEAL)  . dapagliflozin propanediol (FARXIGA) 10 MG TABS tablet Take 1 tablet (10 mg total) by mouth daily before breakfast.  . diphenhydrAMINE (BENADRYL) 25 MG tablet Take 50 mg by mouth every 6 (six) hours as needed for allergies.  . DULoxetine (CYMBALTA) 30 MG capsule Take 90 mg by mouth daily.   Marland Kitchen entecavir (BARACLUDE) 0.5 MG tablet Take 1 tablet (0.5 mg total) by mouth every other  day. Please note: Every OTHER day (Patient taking differently: Take 1 mg by mouth every other day. In the evening)  . ezetimibe (ZETIA) 10 MG tablet TAKE 1 TABLET(10 MG) BY MOUTH DAILY (Patient taking differently: Take 10 mg by mouth daily.)  . ferrous sulfate 325 (65 FE) MG tablet Take 325 mg by mouth daily with breakfast.  . fluticasone (FLONASE) 50 MCG/ACT nasal spray SHAKE LIQUID AND USE 1 SPRAY IN EACH NOSTRIL DAILY (Patient taking differently: Place 1 spray into both nostrils at bedtime.)  . furosemide (LASIX) 40 MG tablet One tablet by mouth ( 40 mg) daily. (Patient taking differently: Take 40 mg by mouth daily. One tablet by mouth ( 40 mg) daily.)  . hydrALAZINE (APRESOLINE) 50 MG tablet TAKE 1 TABLET(50 MG) BY MOUTH TWICE DAILY (Patient taking differently: Take 50 mg by mouth in the morning and at bedtime.)  . ondansetron (ZOFRAN-ODT) 8 MG disintegrating tablet Take 8 mg by mouth 2 (two) times daily as needed for nausea or vomiting.  Marland Kitchen oxybutynin (DITROPAN) 5 MG tablet Take 5 mg by mouth 3 (three) times daily.  . polyethylene glycol (MIRALAX) 17 g packet Take as directed, Daily, titrate as needed (Patient taking differently: Take 17 g by mouth daily as needed for moderate constipation. Take as directed, Daily, titrate as needed)  . sildenafil (REVATIO) 20 MG tablet Take 2-5 tablets by mouth once daily as needed prior to sexual activity.  . SYMBICORT 160-4.5 MCG/ACT inhaler INHALE 2 PUFFS INTO THE LUNGS TWICE DAILY (Patient taking differently: Inhale 2 puffs into the lungs in the morning and at bedtime.)  . tamsulosin (FLOMAX) 0.4 MG CAPS capsule Take 0.4 mg by mouth 2 (two) times daily.  Alveda Reasons 20 MG TABS tablet TAKE 1 TABLET(20 MG) BY MOUTH DAILY WITH SUPPER (Patient taking differently: Take 20 mg by mouth daily with supper.)   No current facility-administered medications for this visit. (Other)      REVIEW OF SYSTEMS: ROS    Positive for: Cardiovascular, Eyes, Respiratory,  Heme/Lymph   Negative for: Constitutional, Gastrointestinal, Neurological, Skin, Genitourinary, Musculoskeletal, HENT, Endocrine, Psychiatric, Allergic/Imm   Last edited by Doneen Poisson on 09/30/2020  8:56 AM. (History)       ALLERGIES Allergies  Allergen Reactions  . Prochlorperazine Other (See Comments)    Medication caused a lot of issues like blurred vision, nausea, pain, and carpool turnel  . Codeine Itching, Nausea And Vomiting and Hives  . Hydrocodone-Acetaminophen Itching and Nausea And Vomiting    PAST MEDICAL HISTORY Past Medical History:  Diagnosis Date  . Anxiety   . Arthritis   . Benzodiazepine dependence ([place])   . Bronchial asthma   . CAD (coronary artery disease)    a. Cath  03/2010: mod RCA stenosis, severe diag stenosis, treated medically given lack of angina.  . 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 Chronic systolic congestive heart failure ([place])   . 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 CKD (chronic kidney disease), stage IV (HCC)    Stage 3-4  . Depression   . Diverticulosis   . Hepatitis B   . History of cocaine abuse ([place])   . Hyperlipidemia LDL goal <70   . Hypertension   . Microcytic anemia   . Obesity   . Persistent atrial fibrillation ([place])    a. Episode in 2008 with spontaneous conversion, on amiodarone per EP.  . S/P implantation of automatic cardioverter/defibrillator (AICD)    a. Medtronic, implanted 2012.  . Sleep apnea   . Stroke [facility]) 2004  . Thyroid disease    Past Surgical History:  Procedure Laterality Date  . CARDIOVERSION N/A 01/07/2014   Procedure: CARDIOVERSION;  Surgeon: Lelon Perla, MD;  Location[facility] ENDOSCOPY;  Service: Cardiovascular;  Laterality: N/A;  . CARDIOVERSION N/A 02/11/2014   Procedure: CARDIOVERSION;  Surgeon: Pixie Casino, MD;  Location: [place];  Service: Cardiovascular;  Laterality: N/A;  . CARDIOVERSION N/A 10/01/2020   Procedure: CARDIOVERSION;  Surgeon: Werner Lean, MD;  Location: [facility] ENDOSCOPY;  Service: Cardiovascular;  Laterality: N/A;  . COLONOSCOPY WITH PROPOFOL N/A 02/06/2018   Procedure: COLONOSCOPY WITH PROPOFOL;  Surgeon: Yetta Flock, MD;  Location: [facility] ENDOSCOPY;  Service: Gastroenterology;  Laterality: N/A;  . ICD GENERATOR CHANGEOUT N/A 01/17/2020   Procedure: [place];  Surgeon: Evans Lance, MD;  Location: [facility] CV LAB;  Service: Cardiovascular;  Laterality: N/A;  . LASEK eye surgery Bilateral 05/2018  . LASIK Bilateral   . PACEMAKER INSERTION    . POLYPECTOMY  02/06/2018   Procedure: POLYPECTOMY;  Surgeon: Yetta Flock, MD;  Location: [facility] ENDOSCOPY;  Service: Gastroenterology;;  . TEE WITHOUT CARDIOVERSION N/A 01/07/2014   Procedure: TRANSESOPHAGEAL ECHOCARDIOGRAM (TEE);  Surgeon: Lelon Perla, MD;  Location: [facility] ENDOSCOPY;  Service: Cardiovascular;  Laterality: N/A;  . TEE WITHOUT CARDIOVERSION N/A 02/11/2014   Procedure: TRANSESOPHAGEAL ECHOCARDIOGRAM (TEE);  Surgeon: Pixie Casino, MD;  Location: [place];  Service: Cardiovascular;  Laterality: N/A;  . TEE WITHOUT CARDIOVERSION N/A 10/01/2020   Procedure: TRANSESOPHAGEAL ECHOCARDIOGRAM (TEE);  Surgeon: Werner Lean, MD;  Location: [facility] ENDOSCOPY;  Service: Cardiovascular;  Laterality: N/A;  . TONSILLECTOMY    . TRANSTHORACIC ECHOCARDIOGRAM  10/2006, 12/2009    FAMILY HISTORY Family History  Problem Relation Age of Onset  . Breast cancer Mother   . Multiple myeloma Mother   . Prostate cancer Father   . Diabetes Father   . Peripheral vascular disease Father   . Cerebral palsy Daughter   . Lung disease Neg Hx   . Colon cancer Neg Hx   . Stomach cancer Neg Hx   . Esophageal cancer Neg Hx   . Colon polyps Neg Hx     SOCIAL HISTORY Social History   Tobacco Use  . Smoking status: Former Smoker    Packs/day: 0.50    Years: 0.50    Pack years: 0.25    Types: Cigarettes    Quit date: 05/02/2010    Years since quitting:  10.4  . Smokeless tobacco: Never Used  . Tobacco comment: Significant Second-hand and only 3 months himself  Vaping Use  . Vaping Use: Never used  Substance Use Topics  . Alcohol  use: Not Currently    Alcohol/week: 0.0 standard drinks    Comment: rare  . Drug use: No    Comment: Reported history of cocaine abuse off and on          OPHTHALMIC EXAM:  Base Eye Exam    Visual Acuity (Snellen - Linear)      Right Left   Dist [place] 20/40 -1 20/25 -1   Dist ph [place] 20/30 -1 20/20       Tonometry (Tonopen, 9:08 AM)      Right Left   Pressure 17 16       Pupils      Dark Light Shape React APD   Right 3 2 Round Brisk 0   Left 3 2 Round Brisk 0       Visual Fields      Left Right    Full    Restrictions  Partial outer superior temporal, superior nasal deficiencies       Extraocular Movement      Right Left    Full Full       Neuro/Psych    Oriented x3: Yes   Mood/Affect: Normal       Dilation    Both eyes: 2.5% Phenylephrine, 1.0% Mydriacyl @ 9:08 AM        Slit Lamp and Fundus Exam    Slit Lamp Exam      Right Left   Lids/Lashes Dermatochalasis - upper lid, mild Meibomian gland dysfunction Dermatochalasis - upper lid, mild Meibomian gland dysfunction   Conjunctiva/Sclera White and quiet White and quiet   Cornea Mild arcus, well healed temporal cataract wounds, mild tear film debris Mild Debris in tear film, well healed temporal cataract wounds, mild Arcus, 1+Punctate epithelial erosions   Anterior Chamber Deep and quiet Deep and quiet   Iris Round and dilated Round and dilated   Lens PC IOL in good position, trace Posterior capsular opacification PC IOL in good position   Vitreous Vitreous syneresis Vitreous syneresis, PVD; heme resolved       Fundus Exam      Right Left   Disc Inferior Pallor, vascular loops Pink and Sharp   C/D Ratio 0.3 0.4   Macula Flat, Blunted foveal reflex, mild Retinal pigment epithelial mottling Flat, good foveal reflex, focal RPE  disruption IN fovea, no heme or edema   Vessels Severe Vascular attenuation, superior hemisphere with telangectasias and perivascular exudation / sheathing--stable, Copper wiring, plaques in ST arterioles, Tortuous, BRAO Vascular attenuation, mild Tortuousity, mild Copper wiring, mild AV crossing changes   Periphery Attached, no heme Attached, inferior lattice, peripheral cystoid degeneration, No RT/RD on 360 scleral depression           IMAGING AND PROCEDURES  Imaging and Procedures for _0 @  OCT, Retina - OU - Both Eyes       Right Eye Quality was good. Central Foveal Thickness: 230. Progression has been stable. 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   Left Eye Quality was good. Central Foveal Thickness: 231. Progression has been stable. Findings include normal foveal contour, no SRF, no IRF, retinal drusen  (Focal ellipsoid disruption inferonasal fovea).   Notes *Images captured and stored on drive  Diagnosis / Impression:  OD: Inner retinal atrophy, diffuse inner macular atrophy inferior greater than superior -- remote BRAO -- stable from prior OS: Focal ellipsoid disruption inferonasal fovea  Clinical management:  See  below  Abbreviations: NFP - Normal foveal profile. CME - cystoid macular edema. PED - pigment epithelial detachment. IRF - intraretinal fluid. SRF - subretinal fluid. EZ - ellipsoid zone. ERM - epiretinal membrane. ORA - outer retinal atrophy. ORT - outer retinal tubulation. SRHM - subretinal hyper-reflective material                 ASSESSMENT/PLAN:    ICD-10-CM   1. Posterior vitreous detachment of left eye  H43.812   2. Retinal edema  H35.81 OCT, Retina - OU - Both Eyes  3. Vitreous hemorrhage of left eye (HCC)  H43.12   4. Retinal artery branch occlusion of right eye  H34.231   5. Essential hypertension  I10   6. Hypertensive  retinopathy of both eyes  H35.033   7. Pseudophakia of both eyes  Z96.1    **pt presents acutely today for transient visual loss for about the last month**  - referred by Dr. Marin Comment for episodes of decreased vision OU x1 month  - currently resolved w/ BCVA 20/30 OD, 20/20 OS -- back to baseline  - exam without any pathologies to explain episodes  - no ophthalmic intervention indicated or recommended  - monitor  1,2. Hemorrhagic PVD OS  - Onset late March 2021 -- presented to Dr. Jerilynn Mages. Le per pt history  - Symptoms resolved, VH cleared  - Discussed findings and prognosis  - No RT or RD on 360 scleral depressed exam  - Reviewed s/s of RT/RD  - Strict return precautions for any such RT/RD signs/symptoms  - pt is cleared from a retina standpoint for release to Dr. Marin Comment and resumption of primary eye care  3,4. 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 Hypertensive retinopathy OU  - discussed importance of tight BP control  - monitor  7. Pseudophakia OU  - s/p CE/IOL OU  - beautiful surgery, doing well  - monitor   Ophthalmic Meds Ordered this visit:  No orders of the defined types were placed in this encounter.      Return for f/u 3-4 months, transient vision loss, DFE, OCT, FA (optos, transit OD).  There are no Patient Instructions on file for this visit.   Explained the diagnoses, plan, and follow up with the patient and they expressed understanding.  Patient expressed understanding of the importance of proper follow up care.   This document serves as a record of services personally performed by Gardiner Sleeper, MD, PhD. It was created on their behalf by San Jetty. Owens Shark, OA an ophthalmic technician. The creation of this record is the provider's dictation and/or activities during the visit.    Electronically signed by: San Jetty. Owens Shark, [place] 06.01.2022 11:46 PM  Gardiner Sleeper, M.D., Ph.D. Diseases &  Surgery of the Retina and Vitreous [facility]  I have reviewed the above documentation for accuracy and completeness, and I agree with the above. Gardiner Sleeper, M.D., Ph.D. 10/02/20 11:46 PM   Abbreviations: M myopia (nearsighted); A astigmatism; H hyperopia (farsighted); P presbyopia; Mrx spectacle prescription;  CTL contact lenses; OD right eye; OS left eye; OU both eyes  XT exotropia; ET esotropia; PEK punctate epithelial keratitis; PEE punctate epithelial erosions; DES dry eye syndrome; MGD meibomian gland dysfunction; ATs artificial tears; PFAT's preservative free artificial tears; [place] nuclear sclerotic cataract; PSC posterior subcapsular cataract; ERM epi-retinal membrane; PVD posterior  vitreous detachment; RD retinal detachment; DM diabetes mellitus; DR diabetic retinopathy; NPDR non-proliferative diabetic retinopathy; PDR proliferative diabetic retinopathy; CSME clinically significant macular edema; DME diabetic macular edema; dbh dot blot hemorrhages; CWS cotton wool spot; POAG primary open angle glaucoma; C/D cup-to-disc ratio; HVF humphrey visual field; GVF goldmann visual field; OCT optical coherence tomography; IOP intraocular pressure; BRVO Branch retinal vein occlusion; CRVO central retinal vein occlusion; CRAO central retinal artery occlusion; BRAO branch retinal artery occlusion; RT retinal tear; SB scleral buckle; PPV pars plana vitrectomy; VH Vitreous hemorrhage; PRP panretinal laser photocoagulation; IVK intravitreal kenalog; VMT vitreomacular traction; MH Macular hole;  NVD neovascularization of the disc; NVE neovascularization elsewhere; AREDS age related eye disease study; ARMD age related macular degeneration; POAG primary open angle glaucoma; EBMD epithelial/anterior basement membrane dystrophy; ACIOL anterior chamber intraocular lens; IOL intraocular lens; PCIOL posterior chamber intraocular lens; Phaco/IOL phacoemulsification with intraocular lens  placement; [place] photorefractive keratectomy; LASIK laser assisted in situ keratomileusis; HTN hypertension; DM diabetes mellitus; COPD chronic obstructive pulmonary disease

## 2020-09-30 ENCOUNTER — Other Ambulatory Visit: Payer: Self-pay

## 2020-09-30 ENCOUNTER — Ambulatory Visit (INDEPENDENT_AMBULATORY_CARE_PROVIDER_SITE_OTHER): Payer: Medicaid Other | Admitting: Ophthalmology

## 2020-09-30 ENCOUNTER — Encounter (INDEPENDENT_AMBULATORY_CARE_PROVIDER_SITE_OTHER): Payer: Self-pay | Admitting: Ophthalmology

## 2020-09-30 DIAGNOSIS — H34231 Retinal artery branch occlusion, right eye: Secondary | ICD-10-CM | POA: Diagnosis not present

## 2020-09-30 DIAGNOSIS — I10 Essential (primary) hypertension: Secondary | ICD-10-CM | POA: Diagnosis not present

## 2020-09-30 DIAGNOSIS — H43812 Vitreous degeneration, left eye: Secondary | ICD-10-CM

## 2020-09-30 DIAGNOSIS — H35033 Hypertensive retinopathy, bilateral: Secondary | ICD-10-CM

## 2020-09-30 DIAGNOSIS — Z961 Presence of intraocular lens: Secondary | ICD-10-CM

## 2020-09-30 DIAGNOSIS — H3581 Retinal edema: Secondary | ICD-10-CM | POA: Diagnosis not present

## 2020-09-30 DIAGNOSIS — H4312 Vitreous hemorrhage, left eye: Secondary | ICD-10-CM

## 2020-10-01 ENCOUNTER — Encounter (HOSPITAL_COMMUNITY): Payer: Self-pay | Admitting: Internal Medicine

## 2020-10-01 ENCOUNTER — Ambulatory Visit (HOSPITAL_BASED_OUTPATIENT_CLINIC_OR_DEPARTMENT_OTHER)
Admission: RE | Admit: 2020-10-01 | Discharge: 2020-10-01 | Disposition: A | Discharge status: Home / Self Care | Payer: Medicaid Other | Source: Ambulatory Visit | Attending: Physician Assistant | Admitting: Physician Assistant

## 2020-10-01 ENCOUNTER — Ambulatory Visit (HOSPITAL_COMMUNITY): Payer: Medicaid Other | Admitting: Certified Registered"

## 2020-10-01 ENCOUNTER — Other Ambulatory Visit: Payer: Self-pay

## 2020-10-01 ENCOUNTER — Ambulatory Visit (HOSPITAL_COMMUNITY)
Admission: RE | Admit: 2020-10-01 | Discharge: 2020-10-01 | Disposition: A | Discharge status: Home / Self Care | Payer: Medicaid Other | Attending: Internal Medicine | Admitting: Internal Medicine

## 2020-10-01 ENCOUNTER — Encounter (HOSPITAL_COMMUNITY)
Admission: RE | Disposition: A | Discharge status: Home / Self Care | Payer: Self-pay | Source: Home / Self Care | Attending: Internal Medicine

## 2020-10-01 ENCOUNTER — Encounter: Payer: Self-pay | Admitting: Physician Assistant

## 2020-10-01 DIAGNOSIS — I2510 Atherosclerotic heart disease of native coronary artery without angina pectoris: Secondary | ICD-10-CM | POA: Diagnosis not present

## 2020-10-01 DIAGNOSIS — I4891 Unspecified atrial fibrillation: Secondary | ICD-10-CM | POA: Diagnosis not present

## 2020-10-01 DIAGNOSIS — I130 Hypertensive heart and chronic kidney disease with heart failure and stage 1 through stage 4 chronic kidney disease, or unspecified chronic kidney disease: Secondary | ICD-10-CM | POA: Diagnosis not present

## 2020-10-01 DIAGNOSIS — N184 Chronic kidney disease, stage 4 (severe): Secondary | ICD-10-CM | POA: Insufficient documentation

## 2020-10-01 DIAGNOSIS — Z7951 Long term (current) use of inhaled steroids: Secondary | ICD-10-CM | POA: Diagnosis not present

## 2020-10-01 DIAGNOSIS — I5022 Chronic systolic (congestive) heart failure: Secondary | ICD-10-CM | POA: Insufficient documentation

## 2020-10-01 DIAGNOSIS — Z7901 Long term (current) use of anticoagulants: Secondary | ICD-10-CM | POA: Insufficient documentation

## 2020-10-01 DIAGNOSIS — I340 Nonrheumatic mitral (valve) insufficiency: Secondary | ICD-10-CM | POA: Diagnosis not present

## 2020-10-01 DIAGNOSIS — Z87891 Personal history of nicotine dependence: Secondary | ICD-10-CM | POA: Insufficient documentation

## 2020-10-01 DIAGNOSIS — I4819 Other persistent atrial fibrillation: Secondary | ICD-10-CM | POA: Diagnosis not present

## 2020-10-01 DIAGNOSIS — Z885 Allergy status to narcotic agent status: Secondary | ICD-10-CM | POA: Insufficient documentation

## 2020-10-01 DIAGNOSIS — I428 Other cardiomyopathies: Secondary | ICD-10-CM | POA: Diagnosis not present

## 2020-10-01 DIAGNOSIS — Z79899 Other long term (current) drug therapy: Secondary | ICD-10-CM | POA: Diagnosis not present

## 2020-10-01 DIAGNOSIS — E785 Hyperlipidemia, unspecified: Secondary | ICD-10-CM | POA: Insufficient documentation

## 2020-10-01 DIAGNOSIS — Z7984 Long term (current) use of oral hypoglycemic drugs: Secondary | ICD-10-CM | POA: Diagnosis not present

## 2020-10-01 DIAGNOSIS — I5020 Unspecified systolic (congestive) heart failure: Secondary | ICD-10-CM

## 2020-10-01 HISTORY — PX: TEE WITHOUT CARDIOVERSION: SHX5443

## 2020-10-01 HISTORY — PX: CARDIOVERSION: SHX1299

## 2020-10-01 LAB — ECHO TEE
MV M vel: 4.04 m/s
MV Peak grad: 65.3 mmHg
Radius: 0.6 cm

## 2020-10-01 SURGERY — ECHOCARDIOGRAM, TRANSESOPHAGEAL
Anesthesia: Monitor Anesthesia Care

## 2020-10-01 MED ORDER — LIDOCAINE 2% (20 MG/ML) 5 ML SYRINGE
INTRAMUSCULAR | Status: DC | PRN
  Administered 2020-10-01: 40 mg via INTRAVENOUS

## 2020-10-01 MED ORDER — PROPOFOL 500 MG/50ML IV EMUL
INTRAVENOUS | Status: DC | PRN
  Administered 2020-10-01: 200 ug/kg/min via INTRAVENOUS
  Administered 2020-10-01: 120 ug/kg/min via INTRAVENOUS

## 2020-10-01 MED ORDER — SODIUM CHLORIDE 0.9 % IV SOLN: INTRAVENOUS | Status: DC

## 2020-10-01 NOTE — CV Procedure (Signed)
   TRANSESOPHAGEAL ECHOCARDIOGRAM GUIDED DIRECT CURRENT CARDIOVERSION  NAME:  Cody Ortega    MRN: [id number] DOB:  03/20/1961    ADMIT DATE: 10/01/2020  INDICATIONS: Symptomatic atrial fibrillation  PROCEDURE:   Informed consent was obtained prior to the procedure. The risks, benefits and alternatives for the procedure were discussed and the patient comprehended these risks.  Risks include, but are not limited to, cough, sore throat, vomiting, nausea, somnolence, esophageal and stomach trauma or perforation, bleeding, low blood pressure, aspiration, pneumonia, infection, trauma to the teeth and death.    After a procedural time-out, the oropharynx was anesthetized and the patient was sedated by the anesthesia service. The transesophageal probe was inserted in the esophagus and stomach without difficulty and multiple views were obtained. Anesthesia was monitored by Dr. Ola Spurr.   COMPLICATIONS:    Complications: No complications Patient tolerated procedure well.  KEY FINDINGS:  Depressed LVEF Patent LAA Mild MR Mild TR Full Report to follow.   CARDIOVERSION:     Indications:  Symptomatic Atrial Fibrillation  Procedure Details:  Once the TEE was complete, the patient had the defibrillator pads placed in the anterior and posterior position. Once an appropriate level of sedation was confirmed, the patient was cardioverted x 1 with 200J of biphasic synchronized energy.  The patient converted to NSR.  There were no apparent complications.  The patient had normal neuro status and respiratory status post procedure with vitals stable as recorded elsewhere.  Adequate airway was maintained throughout and vital signs monitored per protocol.  Total Sedation: propofol 550 mg Lidocaine: 40 mg  Rudean Haskell, MD [place]  9:57 AM

## 2020-10-01 NOTE — Interval H&P Note (Signed)
History and Physical Interval Note:  10/01/2020 9:24 AM  Cody Ortega  has presented today for surgery, with the diagnosis of AFIB.  The various methods of treatment have been discussed with the patient and family. After consideration of risks, benefits and other options for treatment, the patient has consented to  Procedure(s): TRANSESOPHAGEAL ECHOCARDIOGRAM (TEE) (N/A) CARDIOVERSION (N/A) as a surgical intervention.  The patient's history has been reviewed, patient examined, no change in status, stable for surgery.  I have reviewed the patient's chart and labs.  Questions were answered to the patient's satisfaction.     Dorothia Passmore A Kaitlynd Phillips

## 2020-10-01 NOTE — Progress Notes (Signed)
Received msg from Dr. Gasper Sells that pt underwent successful TEE/DCCV. LVEF still depressed. Will f/u in afib clinic as scheduled. Otherwise I sent message to Dr. Antionette Char nurse to see if she can help arrange a f/u visit for the end of this month as well.

## 2020-10-01 NOTE — Progress Notes (Signed)
  Echocardiogram Echocardiogram Transesophageal color doppler, doppler, and 3D has been performed.  Darlina Sicilian M 10/01/2020, 10:28 AM

## 2020-10-01 NOTE — Transfer of Care (Signed)
Immediate Anesthesia Transfer of Care Note  Patient: Cody Ortega  Procedure(s) Performed: TRANSESOPHAGEAL ECHOCARDIOGRAM (TEE) (N/A ) CARDIOVERSION (N/A )  Patient Location: PACU  Anesthesia Type:MAC  Level of Consciousness: sedated and patient cooperative  Airway & Oxygen Therapy: Patient Spontanous Breathing and Patient connected to face mask oxygen  Post-op Assessment: Report given to RN, Post -op Vital signs reviewed and stable and Patient moving all extremities  Post vital signs: Reviewed and stable  Last Vitals:  Vitals Value Taken Time  BP    Temp    Pulse 52 10/01/20 1006  Resp    SpO2 97 % 10/01/20 1006  Vitals shown include unvalidated device data.  Last Pain:  Vitals:   10/01/20 0826  TempSrc: Oral  PainSc: 5          Complications: No complications documented.

## 2020-10-01 NOTE — Anesthesia Preprocedure Evaluation (Signed)
Anesthesia Evaluation  Patient identified by MRN, date of birth, ID band Patient awake    Reviewed: Allergy & Precautions, NPO status , Patient's Chart, lab work & pertinent test results  Airway Mallampati: II  TM Distance: >3 FB Neck ROM: Full    Dental no notable dental hx. (+) Missing   Pulmonary neg pulmonary ROS, asthma , COPD, former smoker,    Pulmonary exam normal breath sounds clear to auscultation       Cardiovascular hypertension, Pt. on medications and Pt. on home beta blockers +CHF (EF 25-30%)  Normal cardiovascular exam+ Cardiac Defibrillator  Rhythm:Regular Rate:Normal     Neuro/Psych Anxiety Depression CVA, No Residual Symptoms negative neurological ROS  negative psych ROS   GI/Hepatic negative GI ROS, (+)     substance abuse (distant history of substance abuse)  cocaine use,   Endo/Other  negative endocrine ROS  Renal/GU CRFRenal disease  negative genitourinary   Musculoskeletal negative musculoskeletal ROS (+)   Abdominal   Peds negative pediatric ROS (+)  Hematology negative hematology ROS (+)   Anesthesia Other Findings   Reproductive/Obstetrics negative OB ROS                             Anesthesia Physical  Anesthesia Plan  ASA: III  Anesthesia Plan: MAC   Post-op Pain Management:    Induction:   PONV Risk Score and Plan: 1 and Propofol infusion and Treatment may vary due to age or medical condition  Airway Management Planned: Nasal Cannula and Natural Airway  Additional Equipment:   Intra-op Plan:   Post-operative Plan:   Informed Consent: I have reviewed the patients History and Physical, chart, labs and discussed the procedure including the risks, benefits and alternatives for the proposed anesthesia with the patient or authorized representative who has indicated his/her understanding and acceptance.     Dental advisory given  Plan  Discussed with: CRNA  Anesthesia Plan Comments:         Anesthesia Quick Evaluation

## 2020-10-01 NOTE — Discharge Instructions (Signed)
Transesophageal Echocardiogram Transesophageal echocardiogram (TEE) is a test that uses sound waves to take pictures of your heart. TEE is done by passing a small probe attached to a flexible tube down the part of the body that moves food from your mouth to your stomach (esophagus). The pictures give clear images of your heart. This can help your doctor see if there are problems with your heart. Tell a doctor about:  Any allergies you have.  All medicines you are taking. This includes vitamins, herbs, eye drops, creams, and over-the-counter medicines.  Any problems you or family members have had with anesthetic medicines.  Any blood disorders you have.  Any surgeries you have had.  Any medical conditions you have.  Any swallowing problems.  Whether you have or have had a blockage in the part of the body that moves food from your mouth to your stomach.  Whether you are pregnant or may be pregnant. What are the risks? In general, this is a safe procedure. But, problems may occur, such as:  Damage to nearby structures or organs.  A tear in the part of the body that moves food from your mouth to your stomach.  Irregular heartbeat.  Hoarse voice or trouble swallowing.  Bleeding. What happens before the procedure? Medicines  Ask your doctor about changing or stopping: ? Your normal medicines. ? Vitamins, herbs, and supplements. ? Over-the-counter medicines.  Do not take aspirin or ibuprofen unless you are told to. General instructions  Follow instructions from your doctor about what you cannot eat or drink.  You will take out any dentures or dental retainers.  Plan to have a responsible adult take you home from the hospital or clinic.  Plan to have a responsible adult care for you for the time you are told after you leave the hospital or clinic. This is important. What happens during the procedure?  An IV will be put into one of your veins.  You may be given: ? A  sedative. This medicine helps you relax. ? A medicine to numb the back of your throat. This may be sprayed or gargled.  Your blood pressure, heart rate, and breathing will be watched.  You may be asked to lie on your left side.  A bite block will be placed in your mouth. This keeps you from biting the tube.  The tip of the probe will be placed into the back of your mouth.  You will be asked to swallow.  Your doctor will take pictures of your heart.  The probe and bite block will be taken out after the test is done. The procedure may vary among doctors and hospitals.   What can I expect after the procedure?  You will be monitored until you leave the hospital or clinic. This includes checking your blood pressure, heart rate, breathing rate, and blood oxygen level.  Your throat may feel sore and numb. This will get better over time. You will not be allowed to eat or drink until the numbness has gone away.  It is common to have a sore throat for a day or two.  It is up to you to get the results of your procedure. Ask how to get your results when they are ready. Follow these instructions at home:  If you were given a sedative during your procedure, do not drive or use machines until your doctor says that it is safe.  Return to your normal activities when your doctor says that it is safe.    Keep all follow-up visits. Summary  TEE is a test that uses sound waves to take pictures of your heart.  You will be given a medicine to help you relax.  Do not drive or use machines until your doctor says that it is safe. This information is not intended to replace advice given to you by your health care provider. Make sure you discuss any questions you have with your health care provider. Document Revised: 12/10/2019 Document Reviewed: 12/10/2019 Elsevier Patient Education  2021 [place]. Hospital doctor cardioversion is the delivery of a jolt of electricity to  restore a normal rhythm to the heart. A rhythm that is too fast or is not regular keeps the heart from pumping well. In this procedure, sticky patches or metal paddles are placed on the chest to deliver electricity to the heart from a device. This procedure may be done in an emergency if:  There is low or no blood pressure as a result of the heart rhythm.  Normal rhythm must be restored as fast as possible to protect the brain and heart from further damage.  It may save a life. This may also be a scheduled procedure for irregular or fast heart rhythms that are not immediately life-threatening. Tell a health care provider about:  Any allergies you have.  All medicines you are taking, including vitamins, herbs, eye drops, creams, and over-the-counter medicines.  Any problems you or family members have had with anesthetic medicines.  Any blood disorders you have.  Any surgeries you have had.  Any medical conditions you have.  Whether you are pregnant or may be pregnant. What are the risks? Generally, this is a safe procedure. However, problems may occur, including:  Allergic reactions to medicines.  A blood clot that breaks free and travels to other parts of your body.  The possible return of an abnormal heart rhythm within hours or days after the procedure.  Your heart stopping (cardiac arrest). This is rare. What happens before the procedure? Medicines  Your health care provider may have you start taking: ? Blood-thinning medicines (anticoagulants) so your blood does not clot as easily. ? Medicines to help stabilize your heart rate and rhythm.  Ask your health care provider about: ? Changing or stopping your regular medicines. This is especially important if you are taking diabetes medicines or blood thinners. ? Taking medicines such as aspirin and ibuprofen. These medicines can thin your blood. Do not take these medicines unless your health care provider tells you to take  them. ? Taking over-the-counter medicines, vitamins, herbs, and supplements. General instructions  Follow instructions from your health care provider about eating or drinking restrictions.  Plan to have someone take you home from the hospital or clinic.  If you will be going home right after the procedure, plan to have someone with you for 24 hours.  Ask your health care provider what steps will be taken to help prevent infection. These may include washing your skin with a germ-killing soap. What happens during the procedure?  An IV will be inserted into one of your veins.  Sticky patches (electrodes) or metal paddles may be placed on your chest.  You will be given a medicine to help you relax (sedative).  An electrical shock will be delivered. The procedure may vary among health care providers and hospitals.   What can I expect after the procedure?  Your blood pressure, heart rate, breathing rate, and blood oxygen level will be monitored until you leave  the hospital or clinic.  Your heart rhythm will be watched to make sure it does not change.  You may have some redness on the skin where the shocks were given. Follow these instructions at home:  Do not drive for 24 hours if you were given a sedative during your procedure.  Take over-the-counter and prescription medicines only as told by your health care provider.  Ask your health care provider how to check your pulse. Check it often.  Rest for 48 hours after the procedure or as told by your health care provider.  Avoid or limit your caffeine use as told by your health care provider.  Keep all follow-up visits as told by your health care provider. This is important. Contact a health care provider if:  You feel like your heart is beating too quickly or your pulse is not regular.  You have a serious muscle cramp that does not go away. Get help right away if:  You have discomfort in your chest.  You are dizzy or you  feel faint.  You have trouble breathing or you are short of breath.  Your speech is slurred.  You have trouble moving an arm or leg on one side of your body.  Your fingers or toes turn cold or blue. Summary  Electrical cardioversion is the delivery of a jolt of electricity to restore a normal rhythm to the heart.  This procedure may be done right away in an emergency or may be a scheduled procedure if the condition is not an emergency.  Generally, this is a safe procedure.  After the procedure, check your pulse often as told by your health care provider. This information is not intended to replace advice given to you by your health care provider. Make sure you discuss any questions you have with your health care provider. Document Revised: 11/19/2018 Document Reviewed: 11/19/2018 Elsevier Patient Education  [place].

## 2020-10-02 ENCOUNTER — Inpatient Hospital Stay (HOSPITAL_COMMUNITY)
Admission: EM | Admit: 2020-10-02 | Discharge: 2020-10-07 | DRG: 177 | Disposition: A | Discharge status: Home / Self Care | Payer: Medicaid Other | Attending: Family Medicine | Admitting: Family Medicine

## 2020-10-02 ENCOUNTER — Telehealth: Payer: Self-pay | Admitting: Cardiovascular Disease

## 2020-10-02 ENCOUNTER — Encounter (HOSPITAL_COMMUNITY): Payer: Self-pay | Admitting: Internal Medicine

## 2020-10-02 ENCOUNTER — Other Ambulatory Visit: Payer: Self-pay

## 2020-10-02 ENCOUNTER — Ambulatory Visit (HOSPITAL_COMMUNITY)
Admission: EM | Admit: 2020-10-02 | Discharge: 2020-10-02 | Disposition: A | Discharge status: Home / Self Care | Payer: Medicaid Other | Attending: Internal Medicine | Admitting: Internal Medicine

## 2020-10-02 ENCOUNTER — Emergency Department (HOSPITAL_COMMUNITY): Payer: Medicaid Other

## 2020-10-02 DIAGNOSIS — F32A Depression, unspecified: Secondary | ICD-10-CM | POA: Diagnosis present

## 2020-10-02 DIAGNOSIS — R0902 Hypoxemia: Secondary | ICD-10-CM | POA: Diagnosis not present

## 2020-10-02 DIAGNOSIS — Z885 Allergy status to narcotic agent status: Secondary | ICD-10-CM

## 2020-10-02 DIAGNOSIS — J811 Chronic pulmonary edema: Secondary | ICD-10-CM | POA: Diagnosis not present

## 2020-10-02 DIAGNOSIS — Z7901 Long term (current) use of anticoagulants: Secondary | ICD-10-CM

## 2020-10-02 DIAGNOSIS — K219 Gastro-esophageal reflux disease without esophagitis: Secondary | ICD-10-CM | POA: Diagnosis present

## 2020-10-02 DIAGNOSIS — Z789 Other specified health status: Secondary | ICD-10-CM | POA: Diagnosis not present

## 2020-10-02 DIAGNOSIS — R059 Cough, unspecified: Secondary | ICD-10-CM

## 2020-10-02 DIAGNOSIS — R0600 Dyspnea, unspecified: Secondary | ICD-10-CM

## 2020-10-02 DIAGNOSIS — I130 Hypertensive heart and chronic kidney disease with heart failure and stage 1 through stage 4 chronic kidney disease, or unspecified chronic kidney disease: Secondary | ICD-10-CM | POA: Diagnosis present

## 2020-10-02 DIAGNOSIS — J449 Chronic obstructive pulmonary disease, unspecified: Secondary | ICD-10-CM

## 2020-10-02 DIAGNOSIS — I213 ST elevation (STEMI) myocardial infarction of unspecified site: Secondary | ICD-10-CM | POA: Diagnosis not present

## 2020-10-02 DIAGNOSIS — N400 Enlarged prostate without lower urinary tract symptoms: Secondary | ICD-10-CM | POA: Diagnosis present

## 2020-10-02 DIAGNOSIS — B181 Chronic viral hepatitis B without delta-agent: Secondary | ICD-10-CM | POA: Diagnosis present

## 2020-10-02 DIAGNOSIS — Z87891 Personal history of nicotine dependence: Secondary | ICD-10-CM

## 2020-10-02 DIAGNOSIS — Z20822 Contact with and (suspected) exposure to covid-19: Secondary | ICD-10-CM | POA: Diagnosis present

## 2020-10-02 DIAGNOSIS — E669 Obesity, unspecified: Secondary | ICD-10-CM | POA: Diagnosis present

## 2020-10-02 DIAGNOSIS — J690 Pneumonitis due to inhalation of food and vomit: Principal | ICD-10-CM | POA: Diagnosis present

## 2020-10-02 DIAGNOSIS — F419 Anxiety disorder, unspecified: Secondary | ICD-10-CM | POA: Diagnosis present

## 2020-10-02 DIAGNOSIS — I248 Other forms of acute ischemic heart disease: Secondary | ICD-10-CM | POA: Diagnosis present

## 2020-10-02 DIAGNOSIS — G4733 Obstructive sleep apnea (adult) (pediatric): Secondary | ICD-10-CM | POA: Diagnosis present

## 2020-10-02 DIAGNOSIS — Z95810 Presence of automatic (implantable) cardiac defibrillator: Secondary | ICD-10-CM

## 2020-10-02 DIAGNOSIS — I2510 Atherosclerotic heart disease of native coronary artery without angina pectoris: Secondary | ICD-10-CM | POA: Diagnosis present

## 2020-10-02 DIAGNOSIS — I480 Paroxysmal atrial fibrillation: Secondary | ICD-10-CM | POA: Diagnosis present

## 2020-10-02 DIAGNOSIS — R0602 Shortness of breath: Secondary | ICD-10-CM | POA: Diagnosis not present

## 2020-10-02 DIAGNOSIS — E611 Iron deficiency: Secondary | ICD-10-CM | POA: Diagnosis present

## 2020-10-02 DIAGNOSIS — N184 Chronic kidney disease, stage 4 (severe): Secondary | ICD-10-CM | POA: Diagnosis present

## 2020-10-02 DIAGNOSIS — K7460 Unspecified cirrhosis of liver: Secondary | ICD-10-CM | POA: Diagnosis present

## 2020-10-02 DIAGNOSIS — E7800 Pure hypercholesterolemia, unspecified: Secondary | ICD-10-CM | POA: Diagnosis present

## 2020-10-02 DIAGNOSIS — R000 Tachycardia, unspecified: Secondary | ICD-10-CM | POA: Diagnosis not present

## 2020-10-02 DIAGNOSIS — J441 Chronic obstructive pulmonary disease with (acute) exacerbation: Secondary | ICD-10-CM | POA: Diagnosis present

## 2020-10-02 DIAGNOSIS — Z6834 Body mass index (BMI) 34.0-34.9, adult: Secondary | ICD-10-CM

## 2020-10-02 DIAGNOSIS — J9601 Acute respiratory failure with hypoxia: Secondary | ICD-10-CM | POA: Diagnosis present

## 2020-10-02 DIAGNOSIS — R531 Weakness: Secondary | ICD-10-CM | POA: Diagnosis not present

## 2020-10-02 DIAGNOSIS — I081 Rheumatic disorders of both mitral and tricuspid valves: Secondary | ICD-10-CM | POA: Diagnosis present

## 2020-10-02 DIAGNOSIS — I255 Ischemic cardiomyopathy: Secondary | ICD-10-CM | POA: Diagnosis present

## 2020-10-02 DIAGNOSIS — Z888 Allergy status to other drugs, medicaments and biological substances status: Secondary | ICD-10-CM

## 2020-10-02 DIAGNOSIS — Z8249 Family history of ischemic heart disease and other diseases of the circulatory system: Secondary | ICD-10-CM

## 2020-10-02 DIAGNOSIS — Z7951 Long term (current) use of inhaled steroids: Secondary | ICD-10-CM

## 2020-10-02 DIAGNOSIS — Z8673 Personal history of transient ischemic attack (TIA), and cerebral infarction without residual deficits: Secondary | ICD-10-CM

## 2020-10-02 DIAGNOSIS — Z79899 Other long term (current) drug therapy: Secondary | ICD-10-CM

## 2020-10-02 DIAGNOSIS — I5023 Acute on chronic systolic (congestive) heart failure: Secondary | ICD-10-CM | POA: Diagnosis present

## 2020-10-02 HISTORY — DX: Obesity, unspecified: E66.9

## 2020-10-02 LAB — CBC
HCT: 46.8 % (ref 39.0–52.0)
Hemoglobin: 15.8 g/dL (ref 13.0–17.0)
MCH: 31.9 pg (ref 26.0–34.0)
MCHC: 33.8 g/dL (ref 30.0–36.0)
MCV: 94.4 fL (ref 80.0–100.0)
Platelets: 218 10*3/uL (ref 150–400)
RBC: 4.96 MIL/uL (ref 4.22–5.81)
RDW: 14.7 % (ref 11.5–15.5)
WBC: 11.8 10*3/uL — ABNORMAL HIGH (ref 4.0–10.5)
nRBC: 0 % (ref 0.0–0.2)

## 2020-10-02 LAB — TROPONIN I (HIGH SENSITIVITY)
Troponin I (High Sensitivity): 41 ng/L — ABNORMAL HIGH (ref <18)
Troponin I (High Sensitivity): 42 ng/L — ABNORMAL HIGH (ref <18)

## 2020-10-02 LAB — BASIC METABOLIC PANEL
Anion gap: 12 (ref 5–15)
BUN: 27 mg/dL — ABNORMAL HIGH (ref 6–20)
CO2: 22 mmol/L (ref 22–32)
Calcium: 8.5 mg/dL — ABNORMAL LOW (ref 8.9–10.3)
Chloride: 101 mmol/L (ref 98–111)
Creatinine, Ser: 2.63 mg/dL — ABNORMAL HIGH (ref 0.61–1.24)
GFR, Estimated: 27 mL/min — ABNORMAL LOW (ref >60)
Glucose, Bld: 110 mg/dL — ABNORMAL HIGH (ref 70–99)
Potassium: 3.4 mmol/L — ABNORMAL LOW (ref 3.5–5.1)
Sodium: 135 mmol/L (ref 135–145)

## 2020-10-02 MED ORDER — ASPIRIN 81 MG PO CHEW
CHEWABLE_TABLET | ORAL | Status: AC
  Filled 2020-10-02: qty 4

## 2020-10-02 MED ORDER — ASPIRIN 81 MG PO CHEW
324.0000 mg | CHEWABLE_TABLET | Freq: Once | ORAL | Status: AC
  Administered 2020-10-02: 324 mg via ORAL

## 2020-10-02 NOTE — ED Notes (Signed)
Patient is being discharged from the Urgent Care and sent to the Emergency Department via EMS. Per Nelwyn Salisbury PA, patient is in need of higher level of care due to possible STEMI. Patient is aware and verbalizes understanding of plan of care.  Vitals:   10/02/20 1947  BP: 124/73  Pulse: 70  Resp: (!) 22  Temp: 99.4 F (37.4 C)  SpO2: (!) 88%

## 2020-10-02 NOTE — ED Provider Notes (Signed)
[facility]    CSN: [id number] Arrival date & time: 10/02/20  1935      History   Chief Complaint Chief Complaint  Patient presents with  . Cough  . Shortness of Breath  . Fatigue    HPI Cody Ortega is a 60 y.o. male.   HPI   SOB: Pt reports that he was cardioverted yesterday. This was found to be successful. He states that later that night he started having a bad coughing fit and then became very short of breath. This has continued and worsened so he presents today for cough, SOB, fatigue. He states that he does not feel well. He denies any frank chest pain. He has not tried anything for symptoms.   Past Medical History:  Diagnosis Date  . Anxiety   . Arthritis   . Benzodiazepine dependence ([place])   . Bronchial asthma   . CAD (coronary artery disease)    a. Cath 03/2010: mod RCA stenosis, severe diag stenosis, treated medically given lack of angina.  . 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 Chronic systolic congestive heart failure ([place])   . 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 CKD (chronic kidney disease), stage IV (HCC)    Stage 3-4  . Depression   . Diverticulosis   . Hepatitis B   . History of cocaine abuse ([place])   . Hyperlipidemia LDL goal <70   . Hypertension   . Microcytic anemia   . Obesity   . Persistent atrial fibrillation ([employer])    a. Episode in 2008 with spontaneous conversion, on amiodarone per EP.  . S/P implantation of automatic cardioverter/defibrillator (AICD)    a. Medtronic, implanted 2012.  . Sleep apnea   . Stroke [facility]) 2004  . Thyroid disease     Patient Active Problem List   Diagnosis Date Noted  . HFrEF (heart failure with reduced ejection fraction) ([place])   . Stage 3b chronic kidney disease ([place]) 09/05/2019  . BPH (benign prostatic hyperplasia) 06/19/2019  . Dental abscess 04/30/2018  . Adenomatous polyp of colon 02/09/2018  . 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 Osteoarthritis of both knees 06/12/2017  . COPD with asthma ([place]) 03/17/2016  . Chronic seasonal allergic rhinitis 03/17/2016  . GERD (gastroesophageal reflux disease) 03/17/2016  . Chronic pain syndrome 03/16/2015  . Obstructive sleep apnea 03/16/2015  . Chronic low back pain with right-sided sciatica 03/16/2015  . Sinus brady-tachy syndrome ([place]) 02/20/2014  . Atrial fibrillation ([place]) 12/31/2013  . Automatic implantable cardioverter-defibrillator in situ 03/08/2011  . Erectile dysfunction 02/28/2011  . CAD, NATIVE VESSEL 04/26/2010  . PURE HYPERCHOLESTEROLEMIA 04/12/2010  . HLD (hyperlipidemia) 01/11/2010  . Essential hypertension, malignant 12/18/2009  . Chronic systolic heart failure ([place]) 12/18/2009    Past Surgical History:  Procedure Laterality Date  . CARDIOVERSION N/A 01/07/2014   Procedure: CARDIOVERSION;  Surgeon: Lelon Perla, MD;  Location: [facility] ENDOSCOPY;  Service: Cardiovascular;  Laterality: N/A;  . CARDIOVERSION N/A 02/11/2014   Procedure: CARDIOVERSION;  Surgeon: Pixie Casino, MD;  Location: [place];  Service: Cardiovascular;  Laterality: N/A;  . CARDIOVERSION N/A 10/01/2020   Procedure: CARDIOVERSION;  Surgeon: Werner Lean, MD;  Location: [facility] ENDOSCOPY;  Service: Cardiovascular;  Laterality: N/A;  . COLONOSCOPY WITH PROPOFOL N/A 02/06/2018   Procedure: COLONOSCOPY WITH PROPOFOL;  Surgeon: Yetta Flock, MD;  Location: [facility] ENDOSCOPY;  Service: Gastroenterology;  Laterality: N/A;  . ICD GENERATOR  CHANGEOUT N/A 01/17/2020   Procedure: ICD GENERATOR CHANGEOUT;  Surgeon: Evans Lance, MD;  Location: [place] CV LAB;  Service: Cardiovascular;  Laterality: N/A;  . LASEK eye surgery Bilateral 05/2018  . LASIK Bilateral   . PACEMAKER INSERTION    . POLYPECTOMY  02/06/2018   Procedure: POLYPECTOMY;  Surgeon: Yetta Flock, MD;  Location: [facility] ENDOSCOPY;  Service: Gastroenterology;;  . TEE WITHOUT CARDIOVERSION N/A 01/07/2014   Procedure:  TRANSESOPHAGEAL ECHOCARDIOGRAM (TEE);  Surgeon: Lelon Perla, MD;  Location: [facility] ENDOSCOPY;  Service: Cardiovascular;  Laterality: N/A;  . TEE WITHOUT CARDIOVERSION N/A 02/11/2014   Procedure: TRANSESOPHAGEAL ECHOCARDIOGRAM (TEE);  Surgeon: Pixie Casino, MD;  Location: [place];  Service: Cardiovascular;  Laterality: N/A;  . TEE WITHOUT CARDIOVERSION N/A 10/01/2020   Procedure: TRANSESOPHAGEAL ECHOCARDIOGRAM (TEE);  Surgeon: Werner Lean, MD;  Location: [facility] ENDOSCOPY;  Service: Cardiovascular;  Laterality: N/A;  . TONSILLECTOMY    . TRANSTHORACIC ECHOCARDIOGRAM  10/2006, 12/2009       Home Medications    Prior to Admission medications   Medication Sig Start Date End Date Taking? Authorizing Provider  albuterol (VENTOLIN HFA) 108 (90 Base) MCG/ACT inhaler INHALE 2 PUFFS INTO THE LUNGS EVERY 6 HOURS AS NEEDED FOR WHEEZING OR SHORTNESS OF BREATH Patient taking differently: Inhale 2 puffs into the lungs every 6 (six) hours as needed for wheezing or shortness of breath. INHALE 2 PUFFS INTO THE LUNGS EVERY 6 HOURS AS NEEDED FOR WHEEZING OR SHORTNESS OF BREATH 09/05/19   Ladell Pier, MD  alprazolam Duanne Moron) 2 MG tablet Take 2 mg by mouth every 6 (six) hours as needed for anxiety.    [provider]  amiodarone (PACERONE) 200 MG tablet Take one tablet by mouth ( 200 mg ) twice daily. Patient taking differently: Take 200 mg by mouth 2 (two) times daily. Take one tablet by mouth ( 200 mg ) twice daily. 09/15/20   Dunn, Dayna N, PA-C  amLODipine (NORVASC) 10 MG tablet TAKE 1 TABLET BY MOUTH EVERY DAY Patient taking differently: Take 10 mg by mouth daily. 04/30/20   [name], MD  carvedilol (COREG) 12.5 MG tablet TAKE 1 TABLET(12.5 MG) BY MOUTH TWICE DAILY WITH A MEAL Patient taking differently: Take 12.5 mg by mouth 2 (two) times daily with a meal. TAKE 1 TABLET(12.5 MG) BY MOUTH TWICE DAILY WITH A MEAL 04/09/20   [name], MD  dapagliflozin propanediol  (FARXIGA) 10 MG TABS tablet Take 1 tablet (10 mg total) by mouth daily before breakfast. 06/23/20   Freada Bergeron, MD  Dextran 70-Hypromellose, PF, ([employer] NATURAL TEARS) 0.1-0.3 % SOLN Place 1 drop into both eyes daily as needed (dry eyes).    [provider]  DULoxetine (CYMBALTA) 30 MG capsule Take 90 mg by mouth daily.     [provider]  entecavir (BARACLUDE) 0.5 MG tablet Take 1 tablet (0.5 mg total) by mouth every other day. Please note: Every OTHER day 04/06/20   Yetta Flock, MD  ezetimibe (ZETIA) 10 MG tablet TAKE 1 TABLET(10 MG) BY MOUTH DAILY Patient taking differently: Take 10 mg by mouth daily. 04/30/20   [name], MD  ferrous sulfate 325 (65 FE) MG tablet Take 325 mg by mouth daily with breakfast.    [provider]  fluticasone (FLONASE) 50 MCG/ACT nasal spray SHAKE LIQUID AND USE 1 SPRAY IN EACH NOSTRIL DAILY Patient taking differently: Place 1 spray into both nostrils at bedtime. 03/19/20   Ladell Pier, MD  furosemide (LASIX) 40 MG tablet One tablet by mouth ( 40 mg) daily. Patient taking differently: Take 40 mg by mouth daily. One tablet by mouth ( 40 mg) daily. 09/15/20   Dunn, Nedra Hai, PA-C  hydrALAZINE (APRESOLINE) 50 MG tablet TAKE 1 TABLET(50 MG) BY MOUTH TWICE DAILY Patient taking differently: Take 50 mg by mouth in the morning and at bedtime. 05/27/20   [name], MD  ondansetron (ZOFRAN-ODT) 8 MG disintegrating tablet Take 8 mg by mouth 2 (two) times daily as needed for nausea or vomiting. 12/11/19   [provider]  oxybutynin (DITROPAN) 5 MG tablet Take 5 mg by mouth 3 (three) times daily.    [provider]  polyethylene glycol (MIRALAX) 17 g packet Take as directed, Daily, titrate as needed Patient taking differently: Take 17 g by mouth daily as needed for moderate constipation. Take as directed, Daily, titrate as needed 02/21/20   Armbruster, Carlota Raspberry, MD  sacubitril-valsartan (ENTRESTO) 49-51 MG  Take 1 tablet by mouth 2 (two) times daily.    [provider]  sildenafil (REVATIO) 20 MG tablet Take 2-5 tablets by mouth once daily as needed prior to sexual activity. 05/07/20   Dunn, Dayna N, PA-C  SYMBICORT 160-4.5 MCG/ACT inhaler INHALE 2 PUFFS INTO THE LUNGS TWICE DAILY Patient taking differently: Inhale 2 puffs into the lungs in the morning and at bedtime. 06/29/20   Ladell Pier, MD  tamsulosin (FLOMAX) 0.4 MG CAPS capsule Take 0.4 mg by mouth 2 (two) times daily.    [provider]  XARELTO 20 MG TABS tablet TAKE 1 TABLET(20 MG) BY MOUTH DAILY WITH SUPPER Patient taking differently: Take 20 mg by mouth daily with supper. 09/07/20   [name], MD    Family History Family History  Problem Relation Age of Onset  . Breast cancer Mother   . Multiple myeloma Mother   . Prostate cancer Father   . Diabetes Father   . Peripheral vascular disease Father   . Cerebral palsy Daughter   . Lung disease Neg Hx   . Colon cancer Neg Hx   . Stomach cancer Neg Hx   . Esophageal cancer Neg Hx   . Colon polyps Neg Hx     Social History Social History   Tobacco Use  . Smoking status: Former Smoker    Packs/day: 0.50    Years: 0.50    Pack years: 0.25    Types: Cigarettes    Quit date: 05/02/2010    Years since quitting: 10.4  . Smokeless tobacco: Never Used  . Tobacco comment: Significant Second-hand and only 3 months himself  Vaping Use  . Vaping Use: Never used  Substance Use Topics  . Alcohol use: Not Currently    Alcohol/week: 0.0 standard drinks    Comment: rare  . Drug use: No    Comment: Reported history of cocaine abuse off and on      Allergies   Prochlorperazine, Codeine, and Hydrocodone-acetaminophen   Review of Systems Review of Systems  See above Physical Exam Triage Vital Signs ED Triage Vitals  Enc Vitals Group     BP 10/02/20 1947 124/73     Pulse Rate 10/02/20 1947 70     Resp 10/02/20 1947 (!) 22     Temp 10/02/20 1947 99.4  F (37.4 C)     Temp Source 10/02/20 1947 Oral     SpO2 10/02/20 1947 (!) 88 %     Weight --  Height --      Head Circumference --      Peak Flow --      Pain Score 10/02/20 1944 0     Pain Loc --      Pain Edu? --      Excl. in [place]? --    No data found.  Updated Vital Signs BP 124/73 (BP Location: Left Arm)   Pulse 70   Temp 99.4 F (37.4 C) (Oral)   Resp (!) 22   SpO2 (!) 88%   Visual Acuity Right Eye Distance:   Left Eye Distance:   Bilateral Distance:    Right Eye Near:   Left Eye Near:    Bilateral Near:     Physical Exam Vitals and nursing note reviewed.  Constitutional:      General: He is in acute distress.     Appearance: He is ill-appearing and diaphoretic.  HENT:     Head: Normocephalic and atraumatic.     Mouth/Throat:     Pharynx: Oropharynx is clear.  Cardiovascular:     Comments: Differed due to acuity and EMS rapid response  Pulmonary:     Comments: Differed due to acuity and EMS rapid response  Neurological:     Mental Status: He is alert and oriented to person, place, and time.      UC Treatments / Results  Labs (all labs ordered are listed, but only abnormal results are displayed) Labs Reviewed - No data to display  EKG   Radiology ECHO TEE  Result Date: 10/01/2020    TRANSESOPHOGEAL ECHO REPORT   Patient Name:   Cody Ortega Date of Exam: 10/01/2020 Medical Rec #:  [id number]        Height:       75.0 in Accession #:    [id number]       Weight:       278.0 lb Date of Birth:  1960/09/08        BSA:          2.524 m Patient Age:    71 years         BP:           104/61 mmHg Patient Gender: M                HR:           52 bpm. Exam Location:  Inpatient Procedure: 3D Echo, Transesophageal Echo, Color Doppler and Cardiac Doppler Indications:     Atrial fibrillation I48.91  History:         Patient has prior history of Echocardiogram examinations, most                  recent 05/29/2020. CHF, CAD, Defibrillator, COPD,                   Signs/Symptoms:Shortness of Breath; Risk Factors:Hypertension                  and Sleep Apnea. NICM, Chronic kidney disease, Asthma.  Sonographer:     Darlina Sicilian RDCS Referring Phys:  [id number] [place] Diagnosing Phys: Rudean Haskell MD  Sonographer Comments: PROCEDURE: After discussion of the risks and benefits of a TEE, an informed consent was obtained from the patient. The transesophogeal probe was passed without difficulty through the esophogus of the patient. No local oropharyngeal anesthetic was provided. Sedation performed by different physician. The patient was monitored while under deep sedation. Anesthestetic sedation was  provided intravenously by Anesthesiology: 554.51m of Propofol, 43mof Lidocaine. Image quality was good. The patient's vital signs; including heart rate, blood pressure, and oxygen saturation; remained stable throughout the procedure. The patient developed no complications during the procedure. A successful direct current cardioversion was performed at 200 joules with 1 attempt. IMPRESSIONS  1. Left ventricular ejection fraction, by estimation, is 30 to 35%. Left ventricular ejection fraction by 3D volume is 32 %. The left ventricle has moderately decreased function. The left ventricle demonstrates global hypokinesis. The left ventricular internal cavity size was mildly to moderately dilated.  2. Right ventricular systolic function is mildly reduced. The right ventricular size is normal. In the gastric views, there appears to be a small echodensity on the device lead (2 cm at largest). In evalation of the valve in the bicaval view there does not appears to be evidence of abnormal echodensity (see long series 68). Clinical Correlation advised.  3. Left atrial size was severely dilated. No left atrial/left atrial appendage thrombus was detected.  4. Right atrial size was mildly dilated.  5. The mitral valve is normal in structure. Mild to moderate mitral valve regurgitation,  central in nature with VCW 0.5 and with pulmonary vein systolic blunting.  6. The aortic valve is tricuspid. Aortic valve regurgitation is not visualized. FINDINGS  Left Ventricle: Left ventricular ejection fraction, by estimation, is 30 to 35%. Left ventricular ejection fraction by 3D volume is 32 %. The left ventricle has moderately decreased function. The left ventricle demonstrates global hypokinesis. The left ventricular internal cavity size was mildly to moderately dilated. Right Ventricle: The right ventricular size is normal. Right vetricular wall thickness was not assessed. Right ventricular systolic function is mildly reduced. Left Atrium: Watchman FLX 31 mm could be considered if future LAA- Occluder is pursued. Left atrial size was severely dilated. No left atrial/left atrial appendage thrombus was detected. Right Atrium: Right atrial size was mildly dilated. Pericardium: There is no evidence of pericardial effusion. Mitral Valve: The mitral valve is normal in structure. Mild to moderate mitral valve regurgitation. Tricuspid Valve: The tricuspid valve is normal in structure. Tricuspid valve regurgitation is mild. Aortic Valve: The aortic valve is tricuspid. Aortic valve regurgitation is not visualized. Pulmonic Valve: The pulmonic valve was grossly normal. Pulmonic valve regurgitation is mild. Aorta: The aortic root and ascending aorta are structurally normal, with no evidence of dilitation. IAS/Shunts: The atrial septum is grossly normal. Additional Comments: A device lead is visualized.  LEFT VENTRICLE PLAX 2D LVOT diam:     2.40 cm LVOT Area:     4.52 cm                                3D Volume EF                                LV 3D EF:    Left                                             ventricular  ejection                                             fraction by                                             3D volume                                              is 32 %.                                LV 3D EDV:   207.94 ml                                LV 3D ESV:   139.65 ml                                 3D Volume EF:                                3D EF:        32 %  AORTA Ao Root diam: 3.50 cm Ao Asc diam:  2.90 cm MR Peak grad:    65.3 mmHg   TRICUSPID VALVE MR Mean grad:    38.0 mmHg   TR Peak grad:   14.7 mmHg MR Vmax:         404.00 cm/s TR Vmax:        192.00 cm/s MR Vmean:        290.0 cm/s MR PISA:         2.26 cm    SHUNTS MR PISA Eff ROA: 17 mm      Systemic Diam: 2.40 cm MR PISA Radius:  0.60 cm Rudean Haskell MD Electronically signed by Rudean Haskell MD Signature Date/Time: 10/01/2020/8:09:39 PM    Final     Procedures Procedures (including critical care time)  Medications Ordered in UC Medications  aspirin chewable tablet 324 mg (324 mg Oral Given 10/02/20 2000)    Initial Impression / Assessment and Plan / UC Course  I have reviewed the triage vital signs and the nursing notes.  Pertinent labs & imaging results that were available during my care of the patient were reviewed by me and considered in my medical decision making (see chart for details).     New. Pt's O2 saturation in triage was 80-84% and given his SOB an EKG was immediately performed and he was placed on oxygen. Patient was titrated up to 6 L of oxygen to reach 88%. First EKG concerning for STEMI. Second EKG more reassuring but given clinical picture, Cardiology on call listed as "Anne Ng", and unknown timeframe a code STEMI was called. Two IV lines were placed, he was given (4) 81 mg ASA and EMS arrived very quickly as he was being changed into a gown and  placed on ZOLAR. He was transported to the ER for further work up.  Final Clinical Impressions(s) / UC Diagnoses   Final diagnoses:  None   Discharge Instructions   None    ED Prescriptions    None     PDMP not reviewed this encounter.   Hughie Closs, Hershal Coria 10/02/20  2038

## 2020-10-02 NOTE — ED Notes (Signed)
Report called to ED Charge RN Janett Billow by Elmyra Ricks CMA, charge verbalized understanding.  Code STEMI called at 2001. [facility] states code to be paged out at this time.  EMS en route to pick up pt. Pt placed on Zoll, 2 PIVs in place. Pt currently 91% on 6L [place]. 324mg  ASA given by Etta Quill.

## 2020-10-02 NOTE — ED Notes (Signed)
Code STEMI canceled per MD Pickering/Cardiology

## 2020-10-02 NOTE — Anesthesia Postprocedure Evaluation (Signed)
Anesthesia Post Note  Patient: TAKAI CHIARAMONTE  Procedure(s) Performed: TRANSESOPHAGEAL ECHOCARDIOGRAM (TEE) (N/A ) CARDIOVERSION (N/A )     Patient location during evaluation: PACU Anesthesia Type: General Level of consciousness: awake and alert Pain management: pain level controlled Vital Signs Assessment: post-procedure vital signs reviewed and stable Respiratory status: spontaneous breathing, nonlabored ventilation, respiratory function stable and patient connected to nasal cannula oxygen Cardiovascular status: blood pressure returned to baseline and stable Postop Assessment: no apparent nausea or vomiting Anesthetic complications: no   No complications documented.  Last Vitals:  Vitals:   10/01/20 1010 10/01/20 1025  BP: 104/61 126/88  Pulse: (!) 52 (!) 56  Resp: 11 16  Temp:    SpO2: 96% 94%    Last Pain:  Vitals:   10/01/20 1025  TempSrc:   PainSc: 0-No pain                 Tiajuana Amass

## 2020-10-02 NOTE — ED Triage Notes (Signed)
Pt is here after having a cardiac emergency yesterday, pt states afterwards he has had SOB and weakness with a nonstop cough when laying down.

## 2020-10-02 NOTE — Telephone Encounter (Signed)
Pt have had a bad cough all night following a procedure he had yesterday 10/01/20. Pt said his girlfriend looked up the symptoms on [employer] and said he was ok as long as the phlem is not pink. Pt would like to know if this is normal. Please advise

## 2020-10-02 NOTE — Telephone Encounter (Signed)
Would recommend he be seen by primary care or urgent care today for evaluation

## 2020-10-02 NOTE — ED Provider Notes (Signed)
[facility] EMERGENCY DEPARTMENT Provider Note   CSN: [id number] Arrival date & time: 10/02/20  2018     History Chief Complaint  Patient presents with  . Code STEMI    Cody Ortega is a 60 y.o. male.  HPI Patient sent from urgent care as a code STEMI.  Had had some shortness of breath and cough.  States he had a coughing attack and oxygen level went down.  Reviewing notes appears that EKG was done and they called a code STEMI off this.  History of CHF.  History of atrial fibrillation and had a cardioversion done yesterday.  Does have history of COPD.  Has AICD in place also.  States mild chest pain with coughing but no chest pain now.  EKG done here did not show ST elevation and code STEMI was canceled.    Past Medical History:  Diagnosis Date  . Anxiety   . Arthritis   . Benzodiazepine dependence ([place])   . Bronchial asthma   . CAD (coronary artery disease)    a. Cath 03/2010: mod RCA stenosis, severe diag stenosis, treated medically given lack of angina.  . 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 Chronic systolic congestive heart failure ([place])   . 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 CKD (chronic kidney disease), stage IV (HCC)    Stage 3-4  . Depression   . Diverticulosis   . Hepatitis B   . History of cocaine abuse ([place])   . Hyperlipidemia LDL goal <70   . Hypertension   . Microcytic anemia   . Obesity   . Persistent atrial fibrillation ([place])    a. Episode in 2008 with spontaneous conversion, on amiodarone per EP.  . S/P implantation of automatic cardioverter/defibrillator (AICD)    a. Medtronic, implanted 2012.  . Sleep apnea   . Stroke [facility]) 2004  . Thyroid disease     Patient Active Problem List   Diagnosis Date Noted  . HFrEF (heart failure with reduced ejection fraction) ([place])   . Stage 3b chronic kidney disease ([place]) 09/05/2019  . BPH (benign prostatic hyperplasia) 06/19/2019  . Dental abscess 04/30/2018  .  Adenomatous polyp of colon 02/09/2018  . 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 Osteoarthritis of both knees 06/12/2017  . COPD with asthma ([place]) 03/17/2016  . Chronic seasonal allergic rhinitis 03/17/2016  . GERD (gastroesophageal reflux disease) 03/17/2016  . Chronic pain syndrome 03/16/2015  . Obstructive sleep apnea 03/16/2015  . Chronic low back pain with right-sided sciatica 03/16/2015  . Sinus brady-tachy syndrome ([place]) 02/20/2014  . Atrial fibrillation ([place]) 12/31/2013  . Automatic implantable cardioverter-defibrillator in situ 03/08/2011  . Erectile dysfunction 02/28/2011  . CAD, NATIVE VESSEL 04/26/2010  . PURE HYPERCHOLESTEROLEMIA 04/12/2010  . HLD (hyperlipidemia) 01/11/2010  . Essential hypertension, malignant 12/18/2009  . Chronic systolic heart failure ([place]) 12/18/2009    Past Surgical History:  Procedure Laterality Date  . CARDIOVERSION N/A 01/07/2014   Procedure: CARDIOVERSION;  Surgeon: Lelon Perla, MD;  Location: [facility] ENDOSCOPY;  Service: Cardiovascular;  Laterality: N/A;  . CARDIOVERSION N/A 02/11/2014   Procedure: CARDIOVERSION;  Surgeon: Pixie Casino, MD;  Location: [facility];  Service: Cardiovascular;  Laterality: N/A;  . CARDIOVERSION N/A 10/01/2020   Procedure: CARDIOVERSION;  Surgeon: Werner Lean, MD;  Location: [facility];  Service: Cardiovascular;  Laterality: N/A;  . COLONOSCOPY WITH PROPOFOL N/A 02/06/2018   Procedure: COLONOSCOPY WITH PROPOFOL;  Surgeon: Yetta Flock, MD;  Location: [facility] END[facility];  Service: Gastroenterology;  Laterality: N/A;  . ICD GENERATOR CHANGEOUT N/A 01/17/2020   Procedure: [place];  Surgeon: Evans Lance, MD;  Location: [facility] CV LAB;  Service: Cardiovascular;  Laterality: N/A;  . LASEK eye surgery Bilateral 05/2018  . LASIK Bilateral   . PACEMAKER INSERTION    . POLYPECTOMY  02/06/2018   Procedure: POLYPECTOMY;  Surgeon: Yetta Flock,  MD;  Location: [facility] ENDOSCOPY;  Service: Gastroenterology;;  . TEE WITHOUT CARDIOVERSION N/A 01/07/2014   Procedure: TRANSESOPHAGEAL ECHOCARDIOGRAM (TEE);  Surgeon: Lelon Perla, MD;  Location: [facility] ENDOSCOPY;  Service: Cardiovascular;  Laterality: N/A;  . TEE WITHOUT CARDIOVERSION N/A 02/11/2014   Procedure: TRANSESOPHAGEAL ECHOCARDIOGRAM (TEE);  Surgeon: Pixie Casino, MD;  Location: [facility];  Service: Cardiovascular;  Laterality: N/A;  . TEE WITHOUT CARDIOVERSION N/A 10/01/2020   Procedure: TRANSESOPHAGEAL ECHOCARDIOGRAM (TEE);  Surgeon: Werner Lean, MD;  Location: [facility] ENDOSCOPY;  Service: Cardiovascular;  Laterality: N/A;  . TONSILLECTOMY    . TRANSTHORACIC ECHOCARDIOGRAM  10/2006, 12/2009       Family History  Problem Relation Age of Onset  . Breast cancer Mother   . Multiple myeloma Mother   . Prostate cancer Father   . Diabetes Father   . Peripheral vascular disease Father   . Cerebral palsy Daughter   . Lung disease Neg Hx   . Colon cancer Neg Hx   . Stomach cancer Neg Hx   . Esophageal cancer Neg Hx   . Colon polyps Neg Hx     Social History   Tobacco Use  . Smoking status: Former Smoker    Packs/day: 0.50    Years: 0.50    Pack years: 0.25    Types: Cigarettes    Quit date: 05/02/2010    Years since quitting: 10.4  . Smokeless tobacco: Never Used  . Tobacco comment: Significant Second-hand and only 3 months himself  Vaping Use  . Vaping Use: Never used  Substance Use Topics  . Alcohol use: Not Currently    Alcohol/week: 0.0 standard drinks    Comment: rare  . Drug use: No    Comment: Reported history of cocaine abuse off and on     Home Medications Prior to Admission medications   Medication Sig Start Date End Date Taking? Authorizing Provider  acetaminophen (TYLENOL) 500 MG tablet Take 1,000 mg by mouth every 6 (six) hours as needed for moderate pain or headache.   Yes [provider]  albuterol (VENTOLIN HFA) 108 (90 Base) MCG/ACT  inhaler INHALE 2 PUFFS INTO THE LUNGS EVERY 6 HOURS AS NEEDED FOR WHEEZING OR SHORTNESS OF BREATH Patient taking differently: Inhale 2 puffs into the lungs every 6 (six) hours as needed for wheezing or shortness of breath. INHALE 2 PUFFS INTO THE LUNGS EVERY 6 HOURS AS NEEDED FOR WHEEZING OR SHORTNESS OF BREATH 09/05/19  Yes Ladell Pier, MD  alprazolam Duanne Moron) 2 MG tablet Take 2 mg by mouth every 6 (six) hours as needed for anxiety.   Yes [provider]  amiodarone (PACERONE) 200 MG tablet Take one tablet by mouth ( 200 mg ) twice daily. Patient taking differently: Take 200 mg by mouth 2 (two) times daily. Take one tablet by mouth ( 200 mg ) twice daily. 09/15/20  Yes Dunn, Dayna N, PA-C  amLODipine (NORVASC) 10 MG tablet TAKE 1 TABLET BY MOUTH EVERY DAY Patient taking differently: Take 10 mg by mouth every  evening. 04/30/20  Yes [name], MD  carvedilol (COREG) 12.5 MG tablet TAKE 1 TABLET(12.5 MG) BY MOUTH TWICE DAILY WITH A MEAL Patient taking differently: Take 12.5 mg by mouth 2 (two) times daily with a meal. TAKE 1 TABLET(12.5 MG) BY MOUTH TWICE DAILY WITH A MEAL 04/09/20  Yes [name], MD  dapagliflozin propanediol (FARXIGA) 10 MG TABS tablet Take 1 tablet (10 mg total) by mouth daily before breakfast. 06/23/20  Yes Pemberton, Greer Ee, MD  Dextran 70-Hypromellose, PF, ([employer] NATURAL TEARS) 0.1-0.3 % SOLN Place 1 drop into both eyes daily as needed (dry eyes).   Yes [provider]  diphenhydrAMINE (BENADRYL) 25 MG tablet Take 50 mg by mouth every 6 (six) hours as needed for allergies.   Yes [provider]  DULoxetine (CYMBALTA) 30 MG capsule Take 90 mg by mouth daily.    Yes [provider]  entecavir (BARACLUDE) 0.5 MG tablet Take 1 tablet (0.5 mg total) by mouth every other day. Please note: Every OTHER day Patient taking differently: Take 1 mg by mouth every other day. In the evening 04/06/20  Yes Armbruster, Carlota Raspberry, MD  ezetimibe (ZETIA)  10 MG tablet TAKE 1 TABLET(10 MG) BY MOUTH DAILY Patient taking differently: Take 10 mg by mouth daily. 04/30/20  Yes [name], MD  ferrous sulfate 325 (65 FE) MG tablet Take 325 mg by mouth daily with breakfast.   Yes [provider]  fluticasone (FLONASE) 50 MCG/ACT nasal spray SHAKE LIQUID AND USE 1 SPRAY IN EACH NOSTRIL DAILY Patient taking differently: Place 1 spray into both nostrils at bedtime. 03/19/20  Yes Ladell Pier, MD  furosemide (LASIX) 40 MG tablet One tablet by mouth ( 40 mg) daily. Patient taking differently: Take 40 mg by mouth daily. One tablet by mouth ( 40 mg) daily. 09/15/20  Yes Dunn, Dayna N, PA-C  hydrALAZINE (APRESOLINE) 50 MG tablet TAKE 1 TABLET(50 MG) BY MOUTH TWICE DAILY Patient taking differently: Take 50 mg by mouth in the morning and at bedtime. 05/27/20  Yes [name], MD  ondansetron (ZOFRAN-ODT) 8 MG disintegrating tablet Take 8 mg by mouth 2 (two) times daily as needed for nausea or vomiting. 12/11/19  Yes [provider]  oxybutynin (DITROPAN) 5 MG tablet Take 5 mg by mouth 3 (three) times daily.   Yes [provider]  polyethylene glycol (MIRALAX) 17 g packet Take as directed, Daily, titrate as needed Patient taking differently: Take 17 g by mouth daily as needed for moderate constipation. Take as directed, Daily, titrate as needed 02/21/20  Yes Armbruster, Carlota Raspberry, MD  sildenafil (REVATIO) 20 MG tablet Take 2-5 tablets by mouth once daily as needed prior to sexual activity. 05/07/20  Yes Dunn, Dayna N, PA-C  SYMBICORT 160-4.5 MCG/ACT inhaler INHALE 2 PUFFS INTO THE LUNGS TWICE DAILY Patient taking differently: Inhale 2 puffs into the lungs in the morning and at bedtime. 06/29/20  Yes Ladell Pier, MD  tamsulosin (FLOMAX) 0.4 MG CAPS capsule Take 0.4 mg by mouth 2 (two) times daily.   Yes [provider]  XARELTO 20 MG TABS tablet TAKE 1 TABLET(20 MG) BY MOUTH DAILY WITH SUPPER Patient taking  differently: Take 20 mg by mouth daily with supper. 09/07/20  Yes [name], MD    Allergies    Prochlorperazine, Codeine, and Hydrocodone-acetaminophen  Review of Systems   Review of Systems  Constitutional: Negative for appetite change and fever.  HENT: Negative for congestion.   Respiratory: Positive for cough  and shortness of breath.   Cardiovascular: Negative for chest pain and leg swelling.  Gastrointestinal: Negative for abdominal pain.  Genitourinary: Negative for flank pain.  Musculoskeletal: Negative for back pain.  Skin: Negative for rash.  Neurological: Negative for weakness.  Psychiatric/Behavioral: Negative for confusion.    Physical Exam Updated Vital Signs BP (!) 154/98   Pulse 71   Temp 98.8 F (37.1 C) (Temporal)   Resp (!) 21   Ht $R'6\' 3"'pj$  (1.905 m)   Wt 126.1 kg   SpO2 91%   BMI 34.75 kg/m   Physical Exam Vitals and nursing note reviewed.  HENT:     Head: Normocephalic.     Left Ear: Tympanic membrane normal.  Eyes:     Pupils: Pupils are equal, round, and reactive to light.  Cardiovascular:     Rate and Rhythm: Regular rhythm.  Pulmonary:     Comments: Harsh breath sounds without focal rales. Abdominal:     Tenderness: There is no abdominal tenderness.  Musculoskeletal:     Right lower leg: Edema present.     Left lower leg: Edema present.     Comments: Mild edema bilateral lower extremities.  Skin:    Capillary Refill: Capillary refill takes less than 2 seconds.  Neurological:     Mental Status: He is alert and oriented to person, place, and time.  Psychiatric:        Mood and Affect: Mood normal.     ED Results / Procedures / Treatments   Labs (all labs ordered are listed, but only abnormal results are displayed) Labs Reviewed  BASIC METABOLIC PANEL - Abnormal; Notable for the following components:      Result Value   Potassium 3.4 (*)    Glucose, Bld 110 (*)    BUN 27 (*)    Creatinine, Ser 2.63 (*)    Calcium 8.5 (*)     GFR, Estimated 27 (*)    All other components within normal limits  CBC - Abnormal; Notable for the following components:   WBC 11.8 (*)    All other components within normal limits  TROPONIN I (HIGH SENSITIVITY) - Abnormal; Notable for the following components:   Troponin I (High Sensitivity) 41 (*)    All other components within normal limits  TROPONIN I (HIGH SENSITIVITY) - Abnormal; Notable for the following components:   Troponin I (High Sensitivity) 42 (*)    All other components within normal limits  SARS CORONAVIRUS 2 (TAT 6-24 HRS)    EKG EKG Interpretation  Date/Time:  Friday October 02 2020 20:23:51 EDT Ventricular Rate:  68 PR Interval:  169 QRS Duration: 120 QT Interval:  504 QTC Calculation: 537 R Axis:   50 Text Interpretation: Sinus or ectopic atrial rhythm Ventricular premature complex Incomplete left bundle branch block Anterior Q waves, possibly due to ILBBB Confirmed by Davonna Belling [phone]) on 10/03/2020 12:13:06 AM   Radiology DG Chest Portable 1 View  Result Date: 10/02/2020 CLINICAL DATA:  Shortness of breath EXAM: PORTABLE CHEST 1 VIEW COMPARISON:  05/07/2020 FINDINGS: The heart size and mediastinal contours are within normal limits. There is bilateral pulmonary vascular congestion without overt edema. Normal pleural spaces. The visualized skeletal structures are unremarkable. Unchanged position of left chest wall single lead AICD. IMPRESSION: Pulmonary vascular congestion without overt edema. Electronically Signed   By: Ulyses Jarred M.D.   On: 10/02/2020 21:02   ECHO TEE  Result Date: 10/01/2020    TRANSESOPHOGEAL ECHO REPORT   Patient Name:  Cody Ortega Date of Exam: 10/01/2020 Medical Rec #:  [id number]        Height:       75.0 in Accession #:    [id number]       Weight:       278.0 lb Date of Birth:  [birth year]        BSA:          2.524 m Patient Age:    83 years         BP:           104/61 mmHg Patient Gender: M                HR:           52 bpm.  Exam Location:  Inpatient Procedure: 3D Echo, Transesophageal Echo, Color Doppler and Cardiac Doppler Indications:     Atrial fibrillation I48.91  History:         Patient has prior history of Echocardiogram examinations, most                  recent 05/29/2020. CHF, CAD, Defibrillator, COPD,                  Signs/Symptoms:Shortness of Breath; Risk Factors:Hypertension                  and Sleep Apnea. NICM, Chronic kidney disease, Asthma.  Sonographer:     Darlina Sicilian RDCS Referring Phys:  [id number] [place] Diagnosing Phys: Rudean Haskell MD  Sonographer Comments: PROCEDURE: After discussion of the risks and benefits of a TEE, an informed consent was obtained from the patient. The transesophogeal probe was passed without difficulty through the esophogus of the patient. No local oropharyngeal anesthetic was provided. Sedation performed by different physician. The patient was monitored while under deep sedation. Anesthestetic sedation was provided intravenously by Anesthesiology: 554.84mg  of Propofol, 40mg  of Lidocaine. Image quality was good. The patient's vital signs; including heart rate, blood pressure, and oxygen saturation; remained stable throughout the procedure. The patient developed no complications during the procedure. A successful direct current cardioversion was performed at 200 joules with 1 attempt. IMPRESSIONS  1. Left ventricular ejection fraction, by estimation, is 30 to 35%. Left ventricular ejection fraction by 3D volume is 32 %. The left ventricle has moderately decreased function. The left ventricle demonstrates global hypokinesis. The left ventricular internal cavity size was mildly to moderately dilated.  2. Right ventricular systolic function is mildly reduced. The right ventricular size is normal. In the gastric views, there appears to be a small echodensity on the device lead (2 cm at largest). In evalation of the valve in the bicaval view there does not appears to be evidence of  abnormal echodensity (see long series 68). Clinical Correlation advised.  3. Left atrial size was severely dilated. No left atrial/left atrial appendage thrombus was detected.  4. Right atrial size was mildly dilated.  5. The mitral valve is normal in structure. Mild to moderate mitral valve regurgitation, central in nature with VCW 0.5 and with pulmonary vein systolic blunting.  6. The aortic valve is tricuspid. Aortic valve regurgitation is not visualized. FINDINGS  Left Ventricle: Left ventricular ejection fraction, by estimation, is 30 to 35%. Left ventricular ejection fraction by 3D volume is 32 %. The left ventricle has moderately decreased function. The left ventricle demonstrates global hypokinesis. The left ventricular internal cavity size was mildly to moderately dilated. Right Ventricle: The right ventricular size is  normal. Right vetricular wall thickness was not assessed. Right ventricular systolic function is mildly reduced. Left Atrium: Watchman FLX 31 mm could be considered if future LAA- Occluder is pursued. Left atrial size was severely dilated. No left atrial/left atrial appendage thrombus was detected. Right Atrium: Right atrial size was mildly dilated. Pericardium: There is no evidence of pericardial effusion. Mitral Valve: The mitral valve is normal in structure. Mild to moderate mitral valve regurgitation. Tricuspid Valve: The tricuspid valve is normal in structure. Tricuspid valve regurgitation is mild. Aortic Valve: The aortic valve is tricuspid. Aortic valve regurgitation is not visualized. Pulmonic Valve: The pulmonic valve was grossly normal. Pulmonic valve regurgitation is mild. Aorta: The aortic root and ascending aorta are structurally normal, with no evidence of dilitation. IAS/Shunts: The atrial septum is grossly normal. Additional Comments: A device lead is visualized.  LEFT VENTRICLE PLAX 2D LVOT diam:     2.40 cm LVOT Area:     4.52 cm                                3D Volume EF                                 LV 3D EF:    Left                                             ventricular                                             ejection                                             fraction by                                             3D volume                                             is 32 %.                                LV 3D EDV:   207.94 ml                                LV 3D ESV:   139.65 ml                                 3D Volume EF:  3D EF:        32 %  AORTA Ao Root diam: 3.50 cm Ao Asc diam:  2.90 cm MR Peak grad:    65.3 mmHg   TRICUSPID VALVE MR Mean grad:    38.0 mmHg   TR Peak grad:   14.7 mmHg MR Vmax:         404.00 cm/s TR Vmax:        192.00 cm/s MR Vmean:        290.0 cm/s MR PISA:         2.26 cm    SHUNTS MR PISA Eff ROA: 17 mm      Systemic Diam: 2.40 cm MR PISA Radius:  0.60 cm Rudean Haskell MD Electronically signed by Rudean Haskell MD Signature Date/Time: 10/01/2020/8:09:39 PM    Final     Procedures Procedures   Medications Ordered in ED Medications - No data to display  ED Course  I have reviewed the triage vital signs and the nursing notes.  Pertinent labs & imaging results that were available during my care of the patient were reviewed by me and considered in my medical decision making (see chart for details).    MDM Rules/Calculators/A&P                          Patient did come in as a code stroke.  Had episode of hypoxia at urgent care with coughing.  Initially not hypoxic here but later requiring oxygen to keep level up.  States he has sleep apnea that will get severe.  States he had been dozing off.  However is on oxygen to keep his level in 90s.  X-ray shows some vascular congestion without frank edema.  COVID test pending but patient is vaccinated and thinks he has had it previously.  Had cardioversion for A. fib yesterday.  Appears to still be in a sinus rhythm.  Troponin mildly elevated but  I think this is secondary to cardioversion or hypoxia.  Doubt pulmonary embolism.  Patient is anticoagulated.  With hypoxia patient will require admission to the hospital.  Will give breathing treatments and potentially may need some adjustment of fluid status. Final Clinical Impression(s) / ED Diagnoses Final diagnoses:  Hypoxia  Cough    Rx / DC Orders ED Discharge Orders    None       Davonna Belling, MD 10/03/20 [phone]

## 2020-10-02 NOTE — Telephone Encounter (Signed)
Gave patient recommendation to be checked out today. Will go to urgent care for evaluation. Patient verbalized understanding.

## 2020-10-02 NOTE — ED Triage Notes (Signed)
Pt from UC as Code STEMI, was seen here yesterday for Cardioversion, went to UC for follow up. Presented w SHOB, given 324 ASA, no NTG. Denies CP on arrival. EDP at bedside on arrival Bilateral 20g [facility]

## 2020-10-03 DIAGNOSIS — R0602 Shortness of breath: Secondary | ICD-10-CM | POA: Diagnosis not present

## 2020-10-03 DIAGNOSIS — J9601 Acute respiratory failure with hypoxia: Secondary | ICD-10-CM | POA: Diagnosis not present

## 2020-10-03 DIAGNOSIS — I480 Paroxysmal atrial fibrillation: Secondary | ICD-10-CM | POA: Diagnosis not present

## 2020-10-03 DIAGNOSIS — J811 Chronic pulmonary edema: Secondary | ICD-10-CM | POA: Diagnosis not present

## 2020-10-03 DIAGNOSIS — Z87891 Personal history of nicotine dependence: Secondary | ICD-10-CM | POA: Diagnosis not present

## 2020-10-03 DIAGNOSIS — G4733 Obstructive sleep apnea (adult) (pediatric): Secondary | ICD-10-CM | POA: Diagnosis not present

## 2020-10-03 DIAGNOSIS — J690 Pneumonitis due to inhalation of food and vomit: Secondary | ICD-10-CM | POA: Diagnosis not present

## 2020-10-03 DIAGNOSIS — Z8673 Personal history of transient ischemic attack (TIA), and cerebral infarction without residual deficits: Secondary | ICD-10-CM | POA: Diagnosis not present

## 2020-10-03 DIAGNOSIS — I130 Hypertensive heart and chronic kidney disease with heart failure and stage 1 through stage 4 chronic kidney disease, or unspecified chronic kidney disease: Secondary | ICD-10-CM | POA: Diagnosis not present

## 2020-10-03 DIAGNOSIS — J441 Chronic obstructive pulmonary disease with (acute) exacerbation: Secondary | ICD-10-CM | POA: Diagnosis not present

## 2020-10-03 DIAGNOSIS — F32A Depression, unspecified: Secondary | ICD-10-CM | POA: Diagnosis not present

## 2020-10-03 DIAGNOSIS — Z20822 Contact with and (suspected) exposure to covid-19: Secondary | ICD-10-CM | POA: Diagnosis not present

## 2020-10-03 DIAGNOSIS — R0902 Hypoxemia: Secondary | ICD-10-CM | POA: Diagnosis not present

## 2020-10-03 DIAGNOSIS — I081 Rheumatic disorders of both mitral and tricuspid valves: Secondary | ICD-10-CM | POA: Diagnosis not present

## 2020-10-03 DIAGNOSIS — I248 Other forms of acute ischemic heart disease: Secondary | ICD-10-CM | POA: Diagnosis not present

## 2020-10-03 DIAGNOSIS — E669 Obesity, unspecified: Secondary | ICD-10-CM | POA: Diagnosis not present

## 2020-10-03 DIAGNOSIS — R059 Cough, unspecified: Secondary | ICD-10-CM | POA: Diagnosis not present

## 2020-10-03 DIAGNOSIS — Z95810 Presence of automatic (implantable) cardiac defibrillator: Secondary | ICD-10-CM | POA: Diagnosis not present

## 2020-10-03 DIAGNOSIS — N184 Chronic kidney disease, stage 4 (severe): Secondary | ICD-10-CM | POA: Diagnosis not present

## 2020-10-03 DIAGNOSIS — Z6834 Body mass index (BMI) 34.0-34.9, adult: Secondary | ICD-10-CM | POA: Diagnosis not present

## 2020-10-03 DIAGNOSIS — I5023 Acute on chronic systolic (congestive) heart failure: Secondary | ICD-10-CM | POA: Diagnosis not present

## 2020-10-03 DIAGNOSIS — I255 Ischemic cardiomyopathy: Secondary | ICD-10-CM | POA: Diagnosis not present

## 2020-10-03 DIAGNOSIS — Z888 Allergy status to other drugs, medicaments and biological substances status: Secondary | ICD-10-CM | POA: Diagnosis not present

## 2020-10-03 DIAGNOSIS — K7460 Unspecified cirrhosis of liver: Secondary | ICD-10-CM | POA: Diagnosis not present

## 2020-10-03 DIAGNOSIS — B181 Chronic viral hepatitis B without delta-agent: Secondary | ICD-10-CM | POA: Diagnosis not present

## 2020-10-03 DIAGNOSIS — Z885 Allergy status to narcotic agent status: Secondary | ICD-10-CM | POA: Diagnosis not present

## 2020-10-03 DIAGNOSIS — Z79899 Other long term (current) drug therapy: Secondary | ICD-10-CM | POA: Diagnosis not present

## 2020-10-03 DIAGNOSIS — I2510 Atherosclerotic heart disease of native coronary artery without angina pectoris: Secondary | ICD-10-CM | POA: Diagnosis not present

## 2020-10-03 LAB — URINALYSIS, ROUTINE W REFLEX MICROSCOPIC
Bilirubin Urine: NEGATIVE
Glucose, UA: 150 mg/dL — AB
Hgb urine dipstick: NEGATIVE
Ketones, ur: NEGATIVE mg/dL
Leukocytes,Ua: NEGATIVE
Nitrite: NEGATIVE
Protein, ur: NEGATIVE mg/dL
Specific Gravity, Urine: 1.008 (ref 1.005–1.030)
pH: 5 (ref 5.0–8.0)

## 2020-10-03 LAB — BASIC METABOLIC PANEL
Anion gap: 12 (ref 5–15)
BUN: 31 mg/dL — ABNORMAL HIGH (ref 6–20)
CO2: 26 mmol/L (ref 22–32)
Calcium: 9.2 mg/dL (ref 8.9–10.3)
Chloride: 101 mmol/L (ref 98–111)
Creatinine, Ser: 2.7 mg/dL — ABNORMAL HIGH (ref 0.61–1.24)
GFR, Estimated: 26 mL/min — ABNORMAL LOW (ref >60)
Glucose, Bld: 88 mg/dL (ref 70–99)
Potassium: 3.4 mmol/L — ABNORMAL LOW (ref 3.5–5.1)
Sodium: 139 mmol/L (ref 135–145)

## 2020-10-03 LAB — RAPID URINE DRUG SCREEN, HOSP PERFORMED
Amphetamines: NOT DETECTED
Barbiturates: NOT DETECTED
Benzodiazepines: POSITIVE — AB
Cocaine: NOT DETECTED
Opiates: NOT DETECTED
Tetrahydrocannabinol: POSITIVE — AB

## 2020-10-03 LAB — CBC
HCT: 47.2 % (ref 39.0–52.0)
Hemoglobin: 15.5 g/dL (ref 13.0–17.0)
MCH: 30.8 pg (ref 26.0–34.0)
MCHC: 32.8 g/dL (ref 30.0–36.0)
MCV: 93.7 fL (ref 80.0–100.0)
Platelets: 228 10*3/uL (ref 150–400)
RBC: 5.04 MIL/uL (ref 4.22–5.81)
RDW: 14.6 % (ref 11.5–15.5)
WBC: 9.2 10*3/uL (ref 4.0–10.5)
nRBC: 0 % (ref 0.0–0.2)

## 2020-10-03 LAB — PHOSPHORUS: Phosphorus: 2.6 mg/dL (ref 2.5–4.6)

## 2020-10-03 LAB — HIV ANTIBODY (ROUTINE TESTING W REFLEX): HIV Screen 4th Generation wRfx: NONREACTIVE

## 2020-10-03 LAB — SARS CORONAVIRUS 2 (TAT 6-24 HRS): SARS Coronavirus 2: NEGATIVE

## 2020-10-03 LAB — BRAIN NATRIURETIC PEPTIDE: B Natriuretic Peptide: 908.8 pg/mL — ABNORMAL HIGH (ref 0.0–100.0)

## 2020-10-03 LAB — MAGNESIUM: Magnesium: 2 mg/dL (ref 1.7–2.4)

## 2020-10-03 MED ORDER — MOMETASONE FURO-FORMOTEROL FUM 200-5 MCG/ACT IN AERO
2.0000 | INHALATION_SPRAY | Freq: Two times a day (BID) | RESPIRATORY_TRACT | Status: DC
  Administered 2020-10-03 – 2020-10-07 (×9): 2 via RESPIRATORY_TRACT
  Filled 2020-10-03: qty 8.8

## 2020-10-03 MED ORDER — SODIUM CHLORIDE 0.9 % IV SOLN
1.5000 g | Freq: Four times a day (QID) | INTRAVENOUS | Status: DC
  Administered 2020-10-03 – 2020-10-07 (×17): 1.5 g via INTRAVENOUS
  Filled 2020-10-03 (×3): qty 4
  Filled 2020-10-03: qty 1.5
  Filled 2020-10-03 (×3): qty 4
  Filled 2020-10-03: qty 1.5
  Filled 2020-10-03 (×8): qty 4
  Filled 2020-10-03: qty 1.5
  Filled 2020-10-03: qty 4

## 2020-10-03 MED ORDER — CARVEDILOL 3.125 MG PO TABS: 3.1250 mg | ORAL_TABLET | Freq: Two times a day (BID) | ORAL | Status: DC

## 2020-10-03 MED ORDER — EZETIMIBE 10 MG PO TABS
10.0000 mg | ORAL_TABLET | Freq: Every day | ORAL | Status: DC
  Administered 2020-10-03 – 2020-10-07 (×5): 10 mg via ORAL
  Filled 2020-10-03 (×5): qty 1

## 2020-10-03 MED ORDER — CARVEDILOL 12.5 MG PO TABS: 12.5000 mg | ORAL_TABLET | Freq: Two times a day (BID) | ORAL | Status: DC

## 2020-10-03 MED ORDER — ENTECAVIR 0.5 MG PO TABS: 1.0000 mg | ORAL_TABLET | ORAL | Status: DC

## 2020-10-03 MED ORDER — PREDNISONE 20 MG PO TABS
40.0000 mg | ORAL_TABLET | Freq: Every day | ORAL | Status: DC
  Administered 2020-10-03: 40 mg via ORAL
  Filled 2020-10-03: qty 2

## 2020-10-03 MED ORDER — FUROSEMIDE 10 MG/ML IJ SOLN
20.0000 mg | Freq: Every day | INTRAMUSCULAR | Status: DC
  Administered 2020-10-03: 20 mg via INTRAVENOUS
  Filled 2020-10-03: qty 2

## 2020-10-03 MED ORDER — ALBUTEROL SULFATE (2.5 MG/3ML) 0.083% IN NEBU
2.5000 mg | INHALATION_SOLUTION | RESPIRATORY_TRACT | Status: DC
  Administered 2020-10-03 (×3): 2.5 mg via RESPIRATORY_TRACT
  Filled 2020-10-03 (×3): qty 3

## 2020-10-03 MED ORDER — ALBUTEROL SULFATE (2.5 MG/3ML) 0.083% IN NEBU
2.5000 mg | INHALATION_SOLUTION | Freq: Three times a day (TID) | RESPIRATORY_TRACT | Status: DC
  Administered 2020-10-04 – 2020-10-05 (×4): 2.5 mg via RESPIRATORY_TRACT
  Filled 2020-10-03 (×5): qty 3

## 2020-10-03 MED ORDER — RIVAROXABAN 20 MG PO TABS
20.0000 mg | ORAL_TABLET | Freq: Every day | ORAL | Status: DC
  Administered 2020-10-03 – 2020-10-06 (×5): 20 mg via ORAL
  Filled 2020-10-03 (×5): qty 1

## 2020-10-03 MED ORDER — POTASSIUM CHLORIDE CRYS ER 20 MEQ PO TBCR
40.0000 meq | EXTENDED_RELEASE_TABLET | Freq: Once | ORAL | Status: AC
  Administered 2020-10-03: 40 meq via ORAL
  Filled 2020-10-03: qty 2

## 2020-10-03 MED ORDER — TAMSULOSIN HCL 0.4 MG PO CAPS
0.4000 mg | ORAL_CAPSULE | Freq: Two times a day (BID) | ORAL | Status: DC
  Administered 2020-10-03 – 2020-10-07 (×10): 0.4 mg via ORAL
  Filled 2020-10-03 (×10): qty 1

## 2020-10-03 MED ORDER — IPRATROPIUM-ALBUTEROL 0.5-2.5 (3) MG/3ML IN SOLN
3.0000 mL | Freq: Four times a day (QID) | RESPIRATORY_TRACT | Status: DC
  Administered 2020-10-03: 3 mL via RESPIRATORY_TRACT
  Filled 2020-10-03: qty 3

## 2020-10-03 MED ORDER — RIVAROXABAN 20 MG PO TABS: 20.0000 mg | ORAL_TABLET | Freq: Every day | ORAL | Status: DC

## 2020-10-03 MED ORDER — AMIODARONE HCL 200 MG PO TABS
200.0000 mg | ORAL_TABLET | Freq: Every day | ORAL | Status: DC
  Administered 2020-10-03 – 2020-10-07 (×5): 200 mg via ORAL
  Filled 2020-10-03 (×5): qty 1

## 2020-10-03 MED ORDER — FUROSEMIDE 40 MG PO TABS
40.0000 mg | ORAL_TABLET | Freq: Every day | ORAL | Status: DC
  Administered 2020-10-03 – 2020-10-06 (×4): 40 mg via ORAL
  Filled 2020-10-03 (×4): qty 1

## 2020-10-03 MED ORDER — DAPAGLIFLOZIN PROPANEDIOL 10 MG PO TABS
10.0000 mg | ORAL_TABLET | Freq: Every day | ORAL | Status: DC
  Administered 2020-10-04 – 2020-10-07 (×4): 10 mg via ORAL
  Filled 2020-10-03 (×4): qty 1

## 2020-10-03 MED ORDER — UMECLIDINIUM BROMIDE 62.5 MCG/INH IN AEPB
1.0000 | INHALATION_SPRAY | Freq: Every day | RESPIRATORY_TRACT | Status: DC
  Administered 2020-10-03 – 2020-10-07 (×5): 1 via RESPIRATORY_TRACT
  Filled 2020-10-03: qty 7

## 2020-10-03 MED ORDER — ALPRAZOLAM 0.25 MG PO TABS
0.2500 mg | ORAL_TABLET | Freq: Two times a day (BID) | ORAL | Status: DC | PRN
  Administered 2020-10-03 – 2020-10-05 (×3): 0.25 mg via ORAL
  Filled 2020-10-03 (×4): qty 1

## 2020-10-03 MED ORDER — DULOXETINE HCL 30 MG PO CPEP
90.0000 mg | ORAL_CAPSULE | Freq: Every day | ORAL | Status: DC
  Administered 2020-10-03 – 2020-10-07 (×5): 90 mg via ORAL
  Filled 2020-10-03 (×5): qty 3

## 2020-10-03 MED ORDER — CARVEDILOL 12.5 MG PO TABS
12.5000 mg | ORAL_TABLET | Freq: Two times a day (BID) | ORAL | Status: DC
  Administered 2020-10-03 – 2020-10-07 (×8): 12.5 mg via ORAL
  Filled 2020-10-03 (×8): qty 1

## 2020-10-03 MED ORDER — OXYBUTYNIN CHLORIDE 5 MG PO TABS
5.0000 mg | ORAL_TABLET | Freq: Three times a day (TID) | ORAL | Status: DC
  Administered 2020-10-03 – 2020-10-07 (×13): 5 mg via ORAL
  Filled 2020-10-03 (×14): qty 1

## 2020-10-03 MED ORDER — POTASSIUM CHLORIDE CRYS ER 20 MEQ PO TBCR
20.0000 meq | EXTENDED_RELEASE_TABLET | Freq: Two times a day (BID) | ORAL | Status: AC
  Administered 2020-10-03 (×2): 20 meq via ORAL
  Filled 2020-10-03 (×2): qty 1

## 2020-10-03 MED ORDER — ALBUTEROL SULFATE (2.5 MG/3ML) 0.083% IN NEBU
2.5000 mg | INHALATION_SOLUTION | Freq: Four times a day (QID) | RESPIRATORY_TRACT | Status: DC | PRN
  Administered 2020-10-03: 2.5 mg via RESPIRATORY_TRACT
  Filled 2020-10-03: qty 3

## 2020-10-03 MED ORDER — DOXYCYCLINE HYCLATE 100 MG PO TABS
100.0000 mg | ORAL_TABLET | Freq: Two times a day (BID) | ORAL | Status: DC
  Administered 2020-10-03: 100 mg via ORAL
  Filled 2020-10-03: qty 1

## 2020-10-03 MED ORDER — GUAIFENESIN-DM 100-10 MG/5ML PO SYRP
5.0000 mL | ORAL_SOLUTION | ORAL | Status: DC | PRN
  Administered 2020-10-04: 5 mL via ORAL
  Filled 2020-10-03: qty 5

## 2020-10-03 MED ORDER — FERROUS SULFATE 325 (65 FE) MG PO TABS
325.0000 mg | ORAL_TABLET | Freq: Every day | ORAL | Status: DC
  Administered 2020-10-03 – 2020-10-07 (×5): 325 mg via ORAL
  Filled 2020-10-03 (×5): qty 1

## 2020-10-03 NOTE — Plan of Care (Signed)
  Problem: Education: Goal: Knowledge of General Education information will improve Description: Including pain rating scale, medication(s)/side effects and non-pharmacologic comfort measures Outcome: Progressing   Problem: Health Behavior/Discharge Planning: Goal: Ability to manage health-related needs will improve Outcome: Progressing   Problem: Clinical Measurements: Goal: Respiratory complications will improve Outcome: Progressing   

## 2020-10-03 NOTE — Progress Notes (Signed)
Patient arrived to unit and ambulated to bed, patient on oxygen via nasal cannula at 3L. Patient SHOB with exertion. No other complaints. Will continue to monitor.

## 2020-10-03 NOTE — Progress Notes (Signed)
Patient states his fiance brought him home med baraclude and he took one. Patient states he does not want to give the medicine to our pharmacy to dispense. Shaquna Geigle Loel Ro

## 2020-10-03 NOTE — Progress Notes (Signed)
[facility] PROGRESS NOTE    Progress Note  Cody Ortega  SHF:[id number] DOB: Feb 10, 1961 DOA: 10/02/2020 PCP: Ladell Pier, MD     Brief Narrative:   Cody Ortega is an 60 y.o. male past medical history significant for asthma, COPD not on oxygen, chronic hepatitis B, paroxysmal atrial fibrillation on Xarelto with a recent TEE cardioversion on 10/01/2020 ischemic cardiomyopathy status post AICD placement with an EF of 30% comes into the urgent care for nonproductive cough for 1 day.  Assessment/Plan:   Acute respiratory failure with hypoxia (HCC) due to aspiration pneumonia: Currently requiring 2 to 4 L to keep saturations greater than 92%. He had a recent TEE cardioversion, about 6 hours after that she started coughing feeling tired and fatigue with dyspnea on ambulation. X-ray showed bilateral infiltrates had a mild leukocytosis on admission, I am concerned about aspiration pneumonia. Discontinue doxycycline start him on IV Unasyn discontinue steroids and IV Lasix. He is not in pulmonary edema he appears euvolemic on physical exam.  COPD exacerbation: Noted continue inhalers no wheezing physical exam.  Elevated troponin: Cardiac biomarkers have basically remained flat denies any chest pain likely due to chronic kidney disease going back through his chart he has had mild elevation in his tropes in the past.  Chronic systolic heart failure: Appears euvolemic on physical exam continue current home medications. Resume home dose of Coreg and Lasix.  Chronic kidney disease stage IV: Appears to be at baseline continue to monitor intermittently.  Paroxysmal atrial fibrillation: Status post ablation continue Xarelto, continue amiodarone 200 mg daily. Will resume his Coreg.    DVT prophylaxis: xarelto Family Communication:none Status is: Observation  The patient will require care spanning > 2 midnights and should be moved to inpatient because: Hemodynamically  unstable  Dispo:  Patient From: Home  Planned Disposition: Home  Medically stable for discharge: No       Code Status:     Code Status Orders  (From admission, onward)         Start     Ordered   10/03/20 0030  Full code  Continuous        10/03/20 0029        Code Status History    Date Active Date Inactive Code Status Order ID Comments User Context   12/25/2013 2146 12/29/2013 1233 Full Code [id number]  Orson Eva, MD Inpatient   Advance Care Planning Activity        IV Access:    Peripheral IV   Procedures and diagnostic studies:   DG Chest Portable 1 View  Result Date: 10/02/2020 CLINICAL DATA:  Shortness of breath EXAM: PORTABLE CHEST 1 VIEW COMPARISON:  05/07/2020 FINDINGS: The heart size and mediastinal contours are within normal limits. There is bilateral pulmonary vascular congestion without overt edema. Normal pleural spaces. The visualized skeletal structures are unremarkable. Unchanged position of left chest wall single lead AICD. IMPRESSION: Pulmonary vascular congestion without overt edema. Electronically Signed   By: Ulyses Jarred M.D.   On: 10/02/2020 21:02   ECHO TEE  Result Date: 10/01/2020    TRANSESOPHOGEAL ECHO REPORT   Patient Name:   Cody Ortega Date of Exam: 10/01/2020 Medical Rec #:  [id number]        Height:       75.0 in Accession #:    [id number]       Weight:       278.0 lb Date of Birth:  [birth year]  BSA:          2.524 m Patient Age:    45 years         BP:           104/61 mmHg Patient Gender: M                HR:           52 bpm. Exam Location:  Inpatient Procedure: 3D Echo, Transesophageal Echo, Color Doppler and Cardiac Doppler Indications:     Atrial fibrillation I48.91  History:         Patient has prior history of Echocardiogram examinations, most                  recent 05/29/2020. CHF, CAD, Defibrillator, COPD,                  Signs/Symptoms:Shortness of Breath; Risk Factors:Hypertension                  and Sleep Apnea. NICM,  Chronic kidney disease, Asthma.  Sonographer:     Darlina Sicilian RDCS Referring Phys:  [id number] [place] Diagnosing Phys: Rudean Haskell MD  Sonographer Comments: PROCEDURE: After discussion of the risks and benefits of a TEE, an informed consent was obtained from the patient. The transesophogeal probe was passed without difficulty through the esophogus of the patient. No local oropharyngeal anesthetic was provided. Sedation performed by different physician. The patient was monitored while under deep sedation. Anesthestetic sedation was provided intravenously by Anesthesiology: 554.84mg  of Propofol, 40mg  of Lidocaine. Image quality was good. The patient's vital signs; including heart rate, blood pressure, and oxygen saturation; remained stable throughout the procedure. The patient developed no complications during the procedure. A successful direct current cardioversion was performed at 200 joules with 1 attempt. IMPRESSIONS  1. Left ventricular ejection fraction, by estimation, is 30 to 35%. Left ventricular ejection fraction by 3D volume is 32 %. The left ventricle has moderately decreased function. The left ventricle demonstrates global hypokinesis. The left ventricular internal cavity size was mildly to moderately dilated.  2. Right ventricular systolic function is mildly reduced. The right ventricular size is normal. In the gastric views, there appears to be a small echodensity on the device lead (2 cm at largest). In evalation of the valve in the bicaval view there does not appears to be evidence of abnormal echodensity (see long series 68). Clinical Correlation advised.  3. Left atrial size was severely dilated. No left atrial/left atrial appendage thrombus was detected.  4. Right atrial size was mildly dilated.  5. The mitral valve is normal in structure. Mild to moderate mitral valve regurgitation, central in nature with VCW 0.5 and with pulmonary vein systolic blunting.  6. The aortic valve is  tricuspid. Aortic valve regurgitation is not visualized. FINDINGS  Left Ventricle: Left ventricular ejection fraction, by estimation, is 30 to 35%. Left ventricular ejection fraction by 3D volume is 32 %. The left ventricle has moderately decreased function. The left ventricle demonstrates global hypokinesis. The left ventricular internal cavity size was mildly to moderately dilated. Right Ventricle: The right ventricular size is normal. Right vetricular wall thickness was not assessed. Right ventricular systolic function is mildly reduced. Left Atrium: Watchman FLX 31 mm could be considered if future LAA- Occluder is pursued. Left atrial size was severely dilated. No left atrial/left atrial appendage thrombus was detected. Right Atrium: Right atrial size was mildly dilated. Pericardium: There is no evidence of pericardial effusion. Mitral  Valve: The mitral valve is normal in structure. Mild to moderate mitral valve regurgitation. Tricuspid Valve: The tricuspid valve is normal in structure. Tricuspid valve regurgitation is mild. Aortic Valve: The aortic valve is tricuspid. Aortic valve regurgitation is not visualized. Pulmonic Valve: The pulmonic valve was grossly normal. Pulmonic valve regurgitation is mild. Aorta: The aortic root and ascending aorta are structurally normal, with no evidence of dilitation. IAS/Shunts: The atrial septum is grossly normal. Additional Comments: A device lead is visualized.  LEFT VENTRICLE PLAX 2D LVOT diam:     2.40 cm LVOT Area:     4.52 cm                                3D Volume EF                                LV 3D EF:    Left                                             ventricular                                             ejection                                             fraction by                                             3D volume                                             is 32 %.                                LV 3D EDV:   207.94 ml                                 LV 3D ESV:   139.65 ml                                 3D Volume EF:                                3D EF:        32 %  AORTA Ao Root diam: 3.50 cm Ao Asc diam:  2.90 cm MR Peak grad:    65.3 mmHg   TRICUSPID VALVE MR Mean grad:    38.0 mmHg   TR Peak grad:   14.7  mmHg MR Vmax:         404.00 cm/s TR Vmax:        192.00 cm/s MR Vmean:        290.0 cm/s MR PISA:         2.26 cm    SHUNTS MR PISA Eff ROA: 17 mm      Systemic Diam: 2.40 cm MR PISA Radius:  0.60 cm Rudean Haskell MD Electronically signed by Rudean Haskell MD Signature Date/Time: 10/01/2020/8:09:39 PM    Final      Medical Consultants:    None.   Subjective:    Cody Ortega he relates his breathing is unchanged compared to yesterday, his appetite has returned.  Objective:    Vitals:   10/03/20 0210 10/03/20 0351 10/03/20 0432 10/03/20 0714  BP: (!) 132/93 (!) 149/85    Pulse: 72 67    Resp: 19 19    Temp: 98.5 F (36.9 C) 98.6 F (37 C)    TempSrc: Oral Oral    SpO2: 94% 95% 93% 90%  Weight: 126.4 kg     Height: 6\' 3"  (1.905 m)      SpO2: 90 % O2 Flow Rate (L/min): 2 L/min   Intake/Output Summary (Last 24 hours) at 10/03/2020 0732 Last data filed at 10/03/2020 0635 Gross per 24 hour  Intake 720 ml  Output 900 ml  Net -180 ml   Filed Weights   10/02/20 2021 10/03/20 0210  Weight: 126.1 kg 126.4 kg    Exam: General exam: In no acute distress. Respiratory system: Good air movement and clear to auscultation, no wheezing Cardiovascular system: S1 & S2 heard, RRR.  No JVD Gastrointestinal system: Abdomen is nondistended, soft and nontender.  Extremities: No pedal edema. Skin: No rashes, lesions or ulcers Psychiatry: Judgement and insight appear normal. Mood & affect appropriate.    Data Reviewed:    Labs: Basic Metabolic Panel: Recent Labs  Lab 10/02/20 2020 10/03/20 0245  NA 135 139  K 3.4* 3.4*  CL 101 101  CO2 22 26  GLUCOSE 110* 88  BUN 27* 31*  CREATININE 2.63* 2.70*   CALCIUM 8.5* 9.2  MG  --  2.0  PHOS  --  2.6   GFR Estimated Creatinine Clearance: 42.7 mL/min (A) (by C-G formula based on SCr of 2.7 mg/dL (H)). Liver Function Tests: No results for input(s): AST, ALT, ALKPHOS, BILITOT, PROT, ALBUMIN in the last 168 hours. No results for input(s): LIPASE, AMYLASE in the last 168 hours. No results for input(s): AMMONIA in the last 168 hours. Coagulation profile No results for input(s): INR, PROTIME in the last 168 hours. COVID-19 Labs  No results for input(s): DDIMER, FERRITIN, LDH, CRP in the last 72 hours.  Lab Results  Component Value Date   SARSCOV2NAA NEGATIVE 10/02/2020   [place] NEGATIVE 01/16/2020    CBC: Recent Labs  Lab 10/02/20 2020 10/03/20 0245  WBC 11.8* 9.2  HGB 15.8 15.5  HCT 46.8 47.2  MCV 94.4 93.7  PLT 218 228   Cardiac Enzymes: No results for input(s): CKTOTAL, CKMB, CKMBINDEX, TROPONINI in the last 168 hours. BNP (last 3 results) Recent Labs    05/08/20 1332  PROBNP 1,120*   CBG: No results for input(s): GLUCAP in the last 168 hours. D-Dimer: No results for input(s): DDIMER in the last 72 hours. Hgb A1c: No results for input(s): HGBA1C in the last 72 hours. Lipid Profile: No results for input(s): CHOL, HDL, LDLCALC, TRIG, CHOLHDL, LDLDIRECT in the last  72 hours. Thyroid function studies: No results for input(s): TSH, T4TOTAL, T3FREE, THYROIDAB in the last 72 hours.  Invalid input(s): FREET3 Anemia work up: No results for input(s): VITAMINB12, FOLATE, FERRITIN, TIBC, IRON, RETICCTPCT in the last 72 hours. Sepsis Labs: Recent Labs  Lab 10/02/20 2020 10/03/20 0245  WBC 11.8* 9.2   Microbiology Recent Results (from the past 240 hour(s))  SARS CORONAVIRUS 2 (TAT 6-24 HRS) Nasopharyngeal Nasopharyngeal Swab     Status: None   Collection Time: 10/02/20  9:28 PM   Specimen: Nasopharyngeal Swab  Result Value Ref Range Status   SARS Coronavirus 2 NEGATIVE NEGATIVE Final    Comment:  (NOTE) SARS-CoV-2 target nucleic acids are NOT DETECTED.  The SARS-CoV-2 RNA is generally detectable in upper and lower respiratory specimens during the acute phase of infection. Negative results do not preclude SARS-CoV-2 infection, do not rule out co-infections with other pathogens, and should not be used as the sole basis for treatment or other patient management decisions. Negative results must be combined with clinical observations, patient history, and epidemiological information. The expected result is Negative.  Fact Sheet for Patients: SugarRoll.be  Fact Sheet for Healthcare Providers: https://www.woods-mathews.com/  This test is not yet approved or cleared by the [place] FDA and  has been authorized for detection and/or diagnosis of SARS-CoV-2 by FDA under an Emergency Use Authorization (EUA). This EUA will remain  in effect (meaning this test can be used) for the duration of the COVID-19 declaration under Se ction 564(b)(1) of the Act, 21 U.S.C. section 360bbb-3(b)(1), unless the authorization is terminated or revoked sooner.  Performed at [facility], [place] [address] [place], [place] [address]      Medications:   . carvedilol  3.125 mg Oral BID WC  . doxycycline  100 mg Oral Q12H  . DULoxetine  90 mg Oral Daily  . entecavir  1 mg Oral QODAY  . ezetimibe  10 mg Oral Daily  . ferrous sulfate  325 mg Oral Q breakfast  . furosemide  20 mg Intravenous Daily  . furosemide  40 mg Oral Daily  . ipratropium-albuterol  3 mL Nebulization Q6H  . mometasone-formoterol  2 puff Inhalation BID  . oxybutynin  5 mg Oral TID  . predniSONE  40 mg Oral Q breakfast  . rivaroxaban  20 mg Oral Q supper  . tamsulosin  0.4 mg Oral BID   Continuous Infusions:    LOS: 0 days   Charlynne Cousins  [facility]  10/03/2020, 7:32 AM

## 2020-10-03 NOTE — Evaluation (Signed)
Occupational Therapy Evaluation Patient Details Name: Cody Ortega MRN: [id number] DOB: [birth year] Today's Date: 10/03/2020    History of Present Illness 60 y.o. male past medical history significant for asthma, COPD not on oxygen, chronic hepatitis B, paroxysmal atrial fibrillation on Xarelto with a recent TEE cardioversion on 10/01/2020 ischemic cardiomyopathy status post AICD placement with an EF of 30% comes into the urgent care for nonproductive cough for 1 day.   Clinical Impression   Patient admitted for the above diagnosis.  PTA he lives at home with his SO, who is around as needed, and can assist if needed.  Patient does desat on RA, down to 81%, with toileting task, and O2 placed at 3.5L and rebounded to 94% after one minute and PLB.  OT discussed energy conservation and PLB, patient verbalized understanding.  Patient does perform daily weights for CHF, and is compliant with all meds.  Patient presents with mild SOB, and is essentially at his baseline.  No acute OT needs identified, and no post acute OT anticipated.  PT eval pending.  Of note, he did experience an 8" fall from his roof attempting to repair a gutter, minor abrasions noted, but no injuries.      Follow Up Recommendations  No OT follow up    Equipment Recommendations  None recommended by OT    Recommendations for Other Services       Precautions / Restrictions Precautions Precaution Comments: O2 sats Restrictions Weight Bearing Restrictions: No      Mobility Bed Mobility Overal bed mobility: Independent               Patient Response: Cooperative  Transfers Overall transfer level: Independent                    Balance Overall balance assessment: No apparent balance deficits (not formally assessed)                                         ADL either performed or assessed with clinical judgement   ADL Overall ADL's : At baseline                                              Vision Patient Visual Report: No change from baseline       Perception     Praxis      Pertinent Vitals/Pain Pain Assessment: No/denies pain     Hand Dominance Right   Extremity/Trunk Assessment Upper Extremity Assessment Upper Extremity Assessment: Overall WFL for tasks assessed   Lower Extremity Assessment Lower Extremity Assessment: Overall WFL for tasks assessed   Cervical / Trunk Assessment Cervical / Trunk Assessment: Normal   Communication Communication Communication: No difficulties   Cognition Arousal/Alertness: Awake/alert Behavior During Therapy: WFL for tasks assessed/performed Overall Cognitive Status: Within Functional Limits for tasks assessed                                       Home Living Family/patient expects to be discharged to:: Private residence Living Arrangements: Spouse/significant other Available Help at Discharge: Family;Available 24 hours/day Type of Home: House Home Access: Stairs to enter [employer] of Steps: 3  Home Layout: One level     Bathroom Shower/Tub: Teacher, early years/pre: Standard     Home Equipment: Shower seat          Prior Functioning/Environment Level of Independence: Independent                 OT Problem List: Decreased activity tolerance      OT Treatment/Interventions:      OT Goals(Current goals can be found in the care plan section) Acute Rehab OT Goals Patient Stated Goal: Return home without O2 OT Goal Formulation: With patient Time For Goal Achievement: 10/03/20 Potential to Achieve Goals: Good  OT Frequency:     Barriers to D/C:            Co-evaluation              AM-PAC OT "6 Clicks" Daily Activity     Outcome Measure Help from another person eating meals?: None Help from another person taking care of personal grooming?: None Help from another person toileting, which includes using toliet, bedpan, or  urinal?: None Help from another person bathing (including washing, rinsing, drying)?: None Help from another person to put on and taking off regular upper body clothing?: None   6 Click Score: 20   End of Session Nurse Communication: Mobility status  Activity Tolerance: Patient tolerated treatment well Patient left: in bed  OT Visit Diagnosis: Other (comment) (Respiratory distress)                Time: 0352-4818 OT Time Calculation (min): 21 min Charges:  OT General Charges $OT Visit: 1 Visit OT Evaluation $OT Eval Moderate Complexity: 1 Mod  10/03/2020  Rich, OTR/L  Acute Rehabilitation Services  Office:  [phone]   Cody Ortega 10/03/2020, 10:13 AM

## 2020-10-03 NOTE — ED Notes (Signed)
Girlfriend sandra cooper [phone] would like an update

## 2020-10-03 NOTE — Evaluation (Signed)
Physical Therapy Evaluation Patient Details Name: Cody Ortega MRN: [id number] DOB: [birth year] Today's Date: 10/03/2020   History of Present Illness  Pt is a 60 y/o male transferred to [facility] from urgent care due to intractable nonproductive cough with pleuritic pain worse with coughing when code STEMI was called. 12-lead EKG shows no evidence of ST segment elevation therefore code STEMI cancelled. While getting ready to discharge from ED, pt found to be hypoxic with SpO2 in mid 80s. CXR shows pulmonary vascular congestion without overt edema. PMH  significant for asthma, COPD not on oxygen supplementation at baseline, OSA intolerant of CPAP, chronic hepatitis B, paroxysmal atrial fibrillation on Xarelto with recent TEE directed cardioversion on 10/01/2020, cardiomyopathy status post ICD placement, HFrEF 30 to 35%.  Clinical Impression  Pt demonstrates ability to perform bed mobility, transfers, and gait without the need for physical assistance. Pt tolerates dynamic gait activities including scanning, stepping over/around obstacles, picking object off of floor, with one LOB noted when stepping over obstacles and able to self correct. Pt demonstrates increased WOB during gait, reporting that is what limits his ability to ambulate longer distances. Pt may benefit from stair training at next session as this is a challenge for him. Pt demonstrates deficits in endurance and activity tolerance and will benefit from acute PT to improve independent mobility. SPT recommends OPPT to improve energy conservation and activity tolerance.    Follow Up Recommendations Outpatient PT    Equipment Recommendations  None recommended by PT    Recommendations for Other Services       Precautions / Restrictions Precautions Precautions: Fall Precaution Comments: O2 sats Restrictions Weight Bearing Restrictions: No      Mobility  Bed Mobility Overal bed mobility: Modified Independent             General bed  mobility comments: HOB elevated    Transfers Overall transfer level: Independent Equipment used: None                Ambulation/Gait Ambulation/Gait assistance: Min guard Gait Distance (Feet): 1000 Feet (2 rest breaks) Assistive device: IV Pole;None Gait Pattern/deviations: Step-through pattern;Decreased stride length Gait velocity: decreased Gait velocity interpretation: >2.62 ft/sec, indicative of community ambulatory General Gait Details: Pt tolerates gait, with and without IV pole. Pt accepts dynamic gait challenges with increased WOB and oxygen desating to 88-89% on 4 L [place].  Stairs            Wheelchair Mobility    Modified Rankin (Stroke Patients Only)       Balance Overall balance assessment: Needs assistance Sitting-balance support: Feet supported Sitting balance-Leahy Scale: Good     Standing balance support: During functional activity Standing balance-Leahy Scale: Good Standing balance comment: Pt tolerates static standing and dynamic standing without reliance of UE support.                             Pertinent Vitals/Pain Pain Assessment: No/denies pain    Home Living Family/patient expects to be discharged to:: Private residence Living Arrangements: Spouse/significant other Available Help at Discharge: Family;Available 24 hours/day Type of Home: House Home Access: Stairs to enter Entrance Stairs-Rails: Can reach both Entrance Stairs-Number of Steps: 3 Home Layout: One level Home Equipment: Shower seat      Prior Function Level of Independence: Independent               Hand Dominance   Dominant Hand: Right    Extremity/Trunk Assessment  Upper Extremity Assessment Upper Extremity Assessment: Defer to OT evaluation    Lower Extremity Assessment Lower Extremity Assessment: Overall WFL for tasks assessed    Cervical / Trunk Assessment Cervical / Trunk Assessment: Normal  Communication   Communication: No  difficulties  Cognition Arousal/Alertness: Awake/alert Behavior During Therapy: WFL for tasks assessed/performed Overall Cognitive Status: Within Functional Limits for tasks assessed                                        General Comments General comments (skin integrity, edema, etc.): Pt on 3.5 L [place] on PT arrival and left on 3.5 L at end of session with oxygen sat levels between 94-95%. Pt reports not using oxygen at baseline.    Exercises     Assessment/Plan    PT Assessment Patient needs continued PT services  PT Problem List Decreased activity tolerance;Decreased mobility;Decreased balance;Cardiopulmonary status limiting activity       PT Treatment Interventions Gait training;Stair training;Functional mobility training;Therapeutic activities;Therapeutic exercise;Balance training;Patient/family education    PT Goals (Current goals can be found in the Care Plan section)  Acute Rehab PT Goals Patient Stated Goal: Get better and go home. PT Goal Formulation: With patient Time For Goal Achievement: 10/17/20 Potential to Achieve Goals: Good    Frequency Min 3X/week   Barriers to discharge        Co-evaluation               AM-PAC PT "6 Clicks" Mobility  Outcome Measure Help needed turning from your back to your side while in a flat bed without using bedrails?: None Help needed moving from lying on your back to sitting on the side of a flat bed without using bedrails?: None Help needed moving to and from a bed to a chair (including a wheelchair)?: None Help needed standing up from a chair using your arms (e.g., wheelchair or bedside chair)?: None Help needed to walk in hospital room?: A Little Help needed climbing 3-5 steps with a railing? : A Little 6 Click Score: 22    End of Session Equipment Utilized During Treatment: Gait belt;Oxygen Activity Tolerance: Patient tolerated treatment well Patient left: in chair;with call bell/phone within  reach Nurse Communication: Mobility status PT Visit Diagnosis: Other abnormalities of gait and mobility (R26.89);Unsteadiness on feet (R26.81);Difficulty in walking, not elsewhere classified (R26.2)    Time: 7867-6720 PT Time Calculation (min) (ACUTE ONLY): 30 min   Charges:   PT Evaluation $PT Eval Low Complexity: 1 Low          Acute Rehab  Pager: [phone]   Garwin Brothers, SPT  10/03/2020, 11:35 AM

## 2020-10-03 NOTE — H&P (Signed)
History and Physical  Cody Ortega DOB: [birth year] DOA: 10/02/2020  Referring physician: Dr. Alvino Chapel, [place]. PCP: Ladell Pier, MD  Outpatient Specialists: Cardiology Patient coming from: Home  Chief Complaint: Intractable nonproductive cough x 1 day  HPI: Cody Ortega is a 60 y.o. male with medical history significant for asthma, COPD not on oxygen supplementation at baseline, OSA intolerant of CPAP, chronic hepatitis B, paroxysmal atrial fibrillation on Xarelto with recent TEE directed cardioversion on 10/01/2020 by Dr. Gasper Sells, cardiomyopathy status post ICD placement, HFrEF 30 to 35% who presented initially to urgent care due to intractable nonproductive cough x1 day.  Associated with pleuritic pain worse with coughing.  He ran out of his rescue inhaler for asthma.  At urgent care a code STEMI was called and the patient was transferred to [place] from [facility] repeated twelve-lead EKG no evidence of ST segment elevation therefore code STEMI was canceled.  While he was getting ready to be discharged from the ED, patient was found to be hypoxic with O2 saturation in the mid 80s.  Chest x-ray revealed mild pulmonary edema.  Not on oxygen supplementation at baseline.  EDP requested admission due to acute hypoxic respiratory failure.  TRH, hospitalist team, was asked to admit.  ED Course:  Temperature T-max 99.4.  BP 132/93, pulse 72, respiratory 19, O2 sats 94% on 3 L.  Lab studies remarkable for serum sodium 135, potassium 3.4, serum bicarb 22, BUN 27, creatinine 2.63 with baseline of 2.5, anion gap of 12, GFR 27.  Troponin 41, 42.  WBC 11.8, hemoglobin 15.8, platelet 216.  BNP 908.  Chest x-ray showing pulmonary vascular congestion without overt edema.  UA is pending at the time of this dictation.  Review of Systems: Review of systems as noted in the HPI. All other systems reviewed and are negative.   Past Medical History:  Diagnosis Date  . Anxiety    . Arthritis   . Benzodiazepine dependence ([place])   . Bronchial asthma   . CAD (coronary artery disease)    a. Cath 03/2010: mod RCA stenosis, severe diag stenosis, treated medically given lack of angina.  . 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 Chronic systolic congestive heart failure ([place])   . 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 CKD (chronic kidney disease), stage IV (HCC)    Stage 3-4  . Depression   . Diverticulosis   . Hepatitis B   . History of cocaine abuse ([place])   . Hyperlipidemia LDL goal <70   . Hypertension   . Microcytic anemia   . Obesity   . Persistent atrial fibrillation ([place])    a. Episode in 2008 with spontaneous conversion, on amiodarone per EP.  . S/P implantation of automatic cardioverter/defibrillator (AICD)    a. Medtronic, implanted 2012.  . Sleep apnea   . Stroke [facility]) 2004  . Thyroid disease    Past Surgical History:  Procedure Laterality Date  . CARDIOVERSION N/A 01/07/2014   Procedure: CARDIOVERSION;  Surgeon: Lelon Perla, MD;  Location[facility] ENDOSCOPY;  Service: Cardiovascular;  Laterality: N/A;  . CARDIOVERSION N/A 02/11/2014   Procedure: CARDIOVERSION;  Surgeon: Pixie Casino, MD;  Location: [place];  Service: Cardiovascular;  Laterality: N/A;  . CARDIOVERSION N/A 10/01/2020   Procedure: CARDIOVERSION;  Surgeon: Werner Lean, MD;  Location: [place];  Service: Cardiovascular;  Laterality: N/A;  . COLONOSCOPY WITH PROPOFOL N/A 02/06/2018   Procedure: COLONOSCOPY WITH  PROPOFOL;  Surgeon: Yetta Flock, MD;  Location: [facility] ENDOSCOPY;  Service: Gastroenterology;  Laterality: N/A;  . ICD GENERATOR CHANGEOUT N/A 01/17/2020   Procedure: [place];  Surgeon: Evans Lance, MD;  Location: [place] CV LAB;  Service: Cardiovascular;  Laterality: N/A;  . LASEK eye surgery Bilateral 05/2018  . LASIK Bilateral   . PACEMAKER INSERTION    . POLYPECTOMY  02/06/2018   Procedure: POLYPECTOMY;   Surgeon: Yetta Flock, MD;  Location: [facility] ENDOSCOPY;  Service: Gastroenterology;;  . TEE WITHOUT CARDIOVERSION N/A 01/07/2014   Procedure: TRANSESOPHAGEAL ECHOCARDIOGRAM (TEE);  Surgeon: Lelon Perla, MD;  Location: [facility] ENDOSCOPY;  Service: Cardiovascular;  Laterality: N/A;  . TEE WITHOUT CARDIOVERSION N/A 02/11/2014   Procedure: TRANSESOPHAGEAL ECHOCARDIOGRAM (TEE);  Surgeon: Pixie Casino, MD;  Location: [place];  Service: Cardiovascular;  Laterality: N/A;  . TEE WITHOUT CARDIOVERSION N/A 10/01/2020   Procedure: TRANSESOPHAGEAL ECHOCARDIOGRAM (TEE);  Surgeon: Werner Lean, MD;  Location: [facility] ENDOSCOPY;  Service: Cardiovascular;  Laterality: N/A;  . TONSILLECTOMY    . TRANSTHORACIC ECHOCARDIOGRAM  10/2006, 12/2009    Social History:  reports that he quit smoking about 10 years ago. His smoking use included cigarettes. He has a 0.25 pack-year smoking history. He has never used smokeless tobacco. He reports previous alcohol use. He reports that he does not use drugs.   Allergies  Allergen Reactions  . Prochlorperazine Other (See Comments)    Medication caused a lot of issues like blurred vision, nausea, pain, and carpool turnel  . Codeine Itching, Nausea And Vomiting and Hives  . Hydrocodone-Acetaminophen Itching and Nausea And Vomiting    Family History  Problem Relation Age of Onset  . Breast cancer Mother   . Multiple myeloma Mother   . Prostate cancer Father   . Diabetes Father   . Peripheral vascular disease Father   . Cerebral palsy Daughter   . Lung disease Neg Hx   . Colon cancer Neg Hx   . Stomach cancer Neg Hx   . Esophageal cancer Neg Hx   . Colon polyps Neg Hx       Prior to Admission medications   Medication Sig Start Date End Date Taking? Authorizing Provider  acetaminophen (TYLENOL) 500 MG tablet Take 1,000 mg by mouth every 6 (six) hours as needed for moderate pain or headache.   Yes [provider]  albuterol (VENTOLIN HFA) 108  (90 Base) MCG/ACT inhaler INHALE 2 PUFFS INTO THE LUNGS EVERY 6 HOURS AS NEEDED FOR WHEEZING OR SHORTNESS OF BREATH Patient taking differently: Inhale 2 puffs into the lungs every 6 (six) hours as needed for wheezing or shortness of breath. INHALE 2 PUFFS INTO THE LUNGS EVERY 6 HOURS AS NEEDED FOR WHEEZING OR SHORTNESS OF BREATH 09/05/19  Yes Ladell Pier, MD  alprazolam Duanne Moron) 2 MG tablet Take 2 mg by mouth every 6 (six) hours as needed for anxiety.   Yes [provider]  amiodarone (PACERONE) 200 MG tablet Take one tablet by mouth ( 200 mg ) twice daily. Patient taking differently: Take 200 mg by mouth 2 (two) times daily. Take one tablet by mouth ( 200 mg ) twice daily. 09/15/20  Yes Dunn, Dayna N, PA-C  amLODipine (NORVASC) 10 MG tablet TAKE 1 TABLET BY MOUTH EVERY DAY Patient taking differently: Take 10 mg by mouth every evening. 04/30/20  Yes [name], MD  carvedilol (COREG) 12.5 MG tablet TAKE 1 TABLET(12.5 MG) BY MOUTH TWICE DAILY WITH A MEAL  Patient taking differently: Take 12.5 mg by mouth 2 (two) times daily with a meal. TAKE 1 TABLET(12.5 MG) BY MOUTH TWICE DAILY WITH A MEAL 04/09/20  Yes [name], MD  dapagliflozin propanediol (FARXIGA) 10 MG TABS tablet Take 1 tablet (10 mg total) by mouth daily before breakfast. 06/23/20  Yes Pemberton, Greer Ee, MD  Dextran 70-Hypromellose, PF, ([employer] NATURAL TEARS) 0.1-0.3 % SOLN Place 1 drop into both eyes daily as needed (dry eyes).   Yes [provider]  diphenhydrAMINE (BENADRYL) 25 MG tablet Take 50 mg by mouth every 6 (six) hours as needed for allergies.   Yes [provider]  DULoxetine (CYMBALTA) 30 MG capsule Take 90 mg by mouth daily.    Yes [provider]  entecavir (BARACLUDE) 0.5 MG tablet Take 1 tablet (0.5 mg total) by mouth every other day. Please note: Every OTHER day Patient taking differently: Take 1 mg by mouth every other day. In the evening 04/06/20  Yes Armbruster, Carlota Raspberry, MD   ezetimibe (ZETIA) 10 MG tablet TAKE 1 TABLET(10 MG) BY MOUTH DAILY Patient taking differently: Take 10 mg by mouth daily. 04/30/20  Yes [name], MD  ferrous sulfate 325 (65 FE) MG tablet Take 325 mg by mouth daily with breakfast.   Yes [provider]  fluticasone (FLONASE) 50 MCG/ACT nasal spray SHAKE LIQUID AND USE 1 SPRAY IN EACH NOSTRIL DAILY Patient taking differently: Place 1 spray into both nostrils at bedtime. 03/19/20  Yes Ladell Pier, MD  furosemide (LASIX) 40 MG tablet One tablet by mouth ( 40 mg) daily. Patient taking differently: Take 40 mg by mouth daily. One tablet by mouth ( 40 mg) daily. 09/15/20  Yes Dunn, Dayna N, PA-C  hydrALAZINE (APRESOLINE) 50 MG tablet TAKE 1 TABLET(50 MG) BY MOUTH TWICE DAILY Patient taking differently: Take 50 mg by mouth in the morning and at bedtime. 05/27/20  Yes [name], MD  ondansetron (ZOFRAN-ODT) 8 MG disintegrating tablet Take 8 mg by mouth 2 (two) times daily as needed for nausea or vomiting. 12/11/19  Yes [provider]  oxybutynin (DITROPAN) 5 MG tablet Take 5 mg by mouth 3 (three) times daily.   Yes [provider]  polyethylene glycol (MIRALAX) 17 g packet Take as directed, Daily, titrate as needed Patient taking differently: Take 17 g by mouth daily as needed for moderate constipation. Take as directed, Daily, titrate as needed 02/21/20  Yes Armbruster, Carlota Raspberry, MD  sildenafil (REVATIO) 20 MG tablet Take 2-5 tablets by mouth once daily as needed prior to sexual activity. 05/07/20  Yes Dunn, Dayna N, PA-C  SYMBICORT 160-4.5 MCG/ACT inhaler INHALE 2 PUFFS INTO THE LUNGS TWICE DAILY Patient taking differently: Inhale 2 puffs into the lungs in the morning and at bedtime. 06/29/20  Yes Ladell Pier, MD  tamsulosin (FLOMAX) 0.4 MG CAPS capsule Take 0.4 mg by mouth 2 (two) times daily.   Yes [provider]  XARELTO 20 MG TABS tablet TAKE 1 TABLET(20 MG) BY MOUTH DAILY WITH  SUPPER Patient taking differently: Take 20 mg by mouth daily with supper. 09/07/20  Yes [name], MD    Physical Exam: BP (!) 154/98   Pulse 71   Temp 98.8 F (37.1 C) (Temporal)   Resp (!) 21   Ht $R'6\' 3"'oh$  (1.905 m)   Wt 126.1 kg   SpO2 91%   BMI 34.75 kg/m   . General: 60 y.o. year-old male well developed well nourished in no acute distress.  Alert and oriented x3. . Cardiovascular: Regular rate and rhythm with no rubs or gallops.  No thyromegaly or JVD noted.  Trace lower extremity edema bilaterally.  Marland Kitchen Respiratory: Faint rales at bases with no wheezing noted.  Good inspiratory effort. . Abdomen: Soft nontender nondistended with normal bowel sounds x4 quadrants. . Muskuloskeletal: No cyanosis or clubbing.  Trace lower extremity edema bilaterally. . Neuro: CN II-XII intact, strength, sensation, reflexes . Skin: No ulcerative lesions noted or rashes . Psychiatry: Judgement and insight appear normal. Mood is appropriate for condition and setting          Labs on Admission:  Basic Metabolic Panel: Recent Labs  Lab 10/02/20 2020  NA 135  K 3.4*  CL 101  CO2 22  GLUCOSE 110*  BUN 27*  CREATININE 2.63*  CALCIUM 8.5*   Liver Function Tests: No results for input(s): AST, ALT, ALKPHOS, BILITOT, PROT, ALBUMIN in the last 168 hours. No results for input(s): LIPASE, AMYLASE in the last 168 hours. No results for input(s): AMMONIA in the last 168 hours. CBC: Recent Labs  Lab 10/02/20 2020  WBC 11.8*  HGB 15.8  HCT 46.8  MCV 94.4  PLT 218   Cardiac Enzymes: No results for input(s): CKTOTAL, CKMB, CKMBINDEX, TROPONINI in the last 168 hours.  BNP (last 3 results) No results for input(s): BNP in the last 8760 hours.  ProBNP (last 3 results) Recent Labs    05/08/20 1332  PROBNP 1,120*    CBG: No results for input(s): GLUCAP in the last 168 hours.  Radiological Exams on Admission: DG Chest Portable 1 View  Result Date: 10/02/2020 CLINICAL DATA:  Shortness of  breath EXAM: PORTABLE CHEST 1 VIEW COMPARISON:  05/07/2020 FINDINGS: The heart size and mediastinal contours are within normal limits. There is bilateral pulmonary vascular congestion without overt edema. Normal pleural spaces. The visualized skeletal structures are unremarkable. Unchanged position of left chest wall single lead AICD. IMPRESSION: Pulmonary vascular congestion without overt edema. Electronically Signed   By: Ulyses Jarred M.D.   On: 10/02/2020 21:02   ECHO TEE  Result Date: 10/01/2020    TRANSESOPHOGEAL ECHO REPORT   Patient Name:   RAEGAN SIPP Date of Exam: 10/01/2020 Medical Rec #:  [id number]        Height:       75.0 in Accession #:    [id number]       Weight:       278.0 lb Date of Birth:  [birth year]        BSA:          2.524 m Patient Age:    86 years         BP:           104/61 mmHg Patient Gender: M                HR:           52 bpm. Exam Location:  Inpatient Procedure: 3D Echo, Transesophageal Echo, Color Doppler and Cardiac Doppler Indications:     Atrial fibrillation I48.91  History:         Patient has prior history of Echocardiogram examinations, most                  recent 05/29/2020. CHF, CAD, Defibrillator, COPD,                  Signs/Symptoms:Shortness of Breath; Risk Factors:Hypertension  and Sleep Apnea. NICM, Chronic kidney disease, Asthma.  Sonographer:     Darlina Sicilian RDCS Referring Phys:  [id number] [place] Diagnosing Phys: Rudean Haskell MD  Sonographer Comments: PROCEDURE: After discussion of the risks and benefits of a TEE, an informed consent was obtained from the patient. The transesophogeal probe was passed without difficulty through the esophogus of the patient. No local oropharyngeal anesthetic was provided. Sedation performed by different physician. The patient was monitored while under deep sedation. Anesthestetic sedation was provided intravenously by Anesthesiology: 554.84mg  of Propofol, 40mg  of Lidocaine. Image quality was good.  The patient's vital signs; including heart rate, blood pressure, and oxygen saturation; remained stable throughout the procedure. The patient developed no complications during the procedure. A successful direct current cardioversion was performed at 200 joules with 1 attempt. IMPRESSIONS  1. Left ventricular ejection fraction, by estimation, is 30 to 35%. Left ventricular ejection fraction by 3D volume is 32 %. The left ventricle has moderately decreased function. The left ventricle demonstrates global hypokinesis. The left ventricular internal cavity size was mildly to moderately dilated.  2. Right ventricular systolic function is mildly reduced. The right ventricular size is normal. In the gastric views, there appears to be a small echodensity on the device lead (2 cm at largest). In evalation of the valve in the bicaval view there does not appears to be evidence of abnormal echodensity (see long series 68). Clinical Correlation advised.  3. Left atrial size was severely dilated. No left atrial/left atrial appendage thrombus was detected.  4. Right atrial size was mildly dilated.  5. The mitral valve is normal in structure. Mild to moderate mitral valve regurgitation, central in nature with VCW 0.5 and with pulmonary vein systolic blunting.  6. The aortic valve is tricuspid. Aortic valve regurgitation is not visualized. FINDINGS  Left Ventricle: Left ventricular ejection fraction, by estimation, is 30 to 35%. Left ventricular ejection fraction by 3D volume is 32 %. The left ventricle has moderately decreased function. The left ventricle demonstrates global hypokinesis. The left ventricular internal cavity size was mildly to moderately dilated. Right Ventricle: The right ventricular size is normal. Right vetricular wall thickness was not assessed. Right ventricular systolic function is mildly reduced. Left Atrium: Watchman FLX 31 mm could be considered if future LAA- Occluder is pursued. Left atrial size was  severely dilated. No left atrial/left atrial appendage thrombus was detected. Right Atrium: Right atrial size was mildly dilated. Pericardium: There is no evidence of pericardial effusion. Mitral Valve: The mitral valve is normal in structure. Mild to moderate mitral valve regurgitation. Tricuspid Valve: The tricuspid valve is normal in structure. Tricuspid valve regurgitation is mild. Aortic Valve: The aortic valve is tricuspid. Aortic valve regurgitation is not visualized. Pulmonic Valve: The pulmonic valve was grossly normal. Pulmonic valve regurgitation is mild. Aorta: The aortic root and ascending aorta are structurally normal, with no evidence of dilitation. IAS/Shunts: The atrial septum is grossly normal. Additional Comments: A device lead is visualized.  LEFT VENTRICLE PLAX 2D LVOT diam:     2.40 cm LVOT Area:     4.52 cm                                3D Volume EF                                LV 3D EF:  Left                                             ventricular                                             ejection                                             fraction by                                             3D volume                                             is 32 %.                                LV 3D EDV:   207.94 ml                                LV 3D ESV:   139.65 ml                                 3D Volume EF:                                3D EF:        32 %  AORTA Ao Root diam: 3.50 cm Ao Asc diam:  2.90 cm MR Peak grad:    65.3 mmHg   TRICUSPID VALVE MR Mean grad:    38.0 mmHg   TR Peak grad:   14.7 mmHg MR Vmax:         404.00 cm/s TR Vmax:        192.00 cm/s MR Vmean:        290.0 cm/s MR PISA:         2.26 cm    SHUNTS MR PISA Eff ROA: 17 mm      Systemic Diam: 2.40 cm MR PISA Radius:  0.60 cm Rudean Haskell MD Electronically signed by Rudean Haskell MD Signature Date/Time: 10/01/2020/8:09:39 PM    Final     EKG: I independently viewed the EKG done and my findings  are as followed: Sinus rhythm rate of 68, nonspecific ST-T changes.  QTc 537.  Assessment/Plan Present on Admission: . Acute respiratory failure with hypoxia (HCC)  Active Problems:   Acute respiratory failure with hypoxia (HCC)  Acute hypoxic respiratory failure suspect multifactorial secondary to COPD exacerbation versus mild pulmonary vascular congestion. Not on oxygen supplementation at baseline Currently on 3 L with O2 saturation of 94%. Treat underlying conditions Gentle  diuresing due to soft blood pressures for pulmonary vascular congestion.. Doxycycline, prednisone, and bronchodilators for COPD exacerbation. Incentive spirometer and scheduled nebulizer Home O2 evaluation prior to discharge  Intractable nonproductive cough suspect secondary to pulmonary edema and suspect superimposed by asthma flare Cough quickly improved after albuterol neb Continue bronchodilators, nebulizers Continue steroids Continue diuretics Continue antitussives as needed  Elevated troponin, suspect demand ischemia in the setting of acute hypoxia. Denies any anginal symptoms. Troponin flat at 42. No evidence of acute ischemia on twelve-lead EKG. Monitor on telemetry.  Asthma flare Patient ran out of his rescue inhaler TOC consulted to assist with nebulizer machine and albuterol neb  COPD exacerbation Management as stated above. Maintain O2 saturation greater than 90%.  Mild pulmonary edema Independently reviewed chest x-ray done on admission which shows mild increase in pulmonary vascularity suggestive of pulmonary edema. Ongoing gentle diuresing with IV Lasix.  HFrEF 30 to 35% TEE done on 10/01/2020 revealed LVEF 30 to 35%, left ventricle demonstrate global hypokinesis, left atrial size was severely dilated, mild to moderate mitral valve regurgitation. BNP 908 on 10/03/2020. Ongoing gentle diuresing Strict I's and O's and daily weight.  CKD 4 Appears to be at his baseline creatinine with  GFR of 26 Avoid nephrotoxic agents and hypotension Monitor urine output Repeat renal panel in the morning  Paroxysmal A. fib with recent TEE directed cardioversion In sinus rhythm. Resume home Xarelto Resume home Coreg at lower doses due to soft blood pressures. Monitor on telemetry.  Hyperlipidemia/iron deficiency/hepatitis B Resume home regimen and treatment for chronic hepatitis B.  Chronic anxiety/depression Resume home duloxetine Resume home Xanax at lower doses as needed for anxiety.  Hyperlipidemia Resume home regimen.  OSA, intolerant of CPAP   DVT prophylaxis: Xarelto.  Code Status: Full code.  Family Communication: None at bedside.  Disposition Plan: Admit to telemetry medical.  Consults called: None.  Admission status: Observation status.   Status is: Observation    Dispo:  Patient From: Home  Planned Disposition: Home possibly on 10/03/2020 or when symptomatology has improved.  Medically stable for discharge: No      Kayleen Memos MD [facility] Pager [phone]  If 7PM-7AM, please contact night-coverage [email or web address] Password [facility]  10/03/2020, 12:37 AM

## 2020-10-03 NOTE — ED Notes (Signed)
Report attempted x 1

## 2020-10-03 NOTE — Plan of Care (Signed)
  Problem: Clinical Measurements: Goal: Respiratory complications will improve Outcome: Progressing   Problem: Coping: Goal: Level of anxiety will decrease Outcome: Progressing   Problem: Safety: Goal: Ability to remain free from injury will improve Outcome: Progressing   

## 2020-10-03 NOTE — Plan of Care (Signed)

## 2020-10-04 ENCOUNTER — Encounter (HOSPITAL_COMMUNITY): Payer: Self-pay | Admitting: Internal Medicine

## 2020-10-04 DIAGNOSIS — J690 Pneumonitis due to inhalation of food and vomit: Secondary | ICD-10-CM | POA: Diagnosis not present

## 2020-10-04 DIAGNOSIS — J9601 Acute respiratory failure with hypoxia: Secondary | ICD-10-CM | POA: Diagnosis not present

## 2020-10-04 LAB — BASIC METABOLIC PANEL
Anion gap: 11 (ref 5–15)
BUN: 33 mg/dL — ABNORMAL HIGH (ref 6–20)
CO2: 23 mmol/L (ref 22–32)
Calcium: 9 mg/dL (ref 8.9–10.3)
Chloride: 104 mmol/L (ref 98–111)
Creatinine, Ser: 2.44 mg/dL — ABNORMAL HIGH (ref 0.61–1.24)
GFR, Estimated: 30 mL/min — ABNORMAL LOW (ref >60)
Glucose, Bld: 92 mg/dL (ref 70–99)
Potassium: 3.3 mmol/L — ABNORMAL LOW (ref 3.5–5.1)
Sodium: 138 mmol/L (ref 135–145)

## 2020-10-04 MED ORDER — ENTECAVIR 0.5 MG PO TABS
1.0000 mg | ORAL_TABLET | ORAL | Status: DC
  Administered 2020-10-05: 1 mg via ORAL

## 2020-10-04 MED ORDER — POTASSIUM CHLORIDE CRYS ER 20 MEQ PO TBCR
40.0000 meq | EXTENDED_RELEASE_TABLET | Freq: Two times a day (BID) | ORAL | Status: AC
  Administered 2020-10-04 (×2): 40 meq via ORAL
  Filled 2020-10-04 (×2): qty 2

## 2020-10-04 NOTE — Progress Notes (Signed)
Nutrition Brief Note  Received nutrition consult from the COPD Protocol. Patient reports no recent weight loss and that intake has been good PTA and since admission.  Wt Readings from Last 15 Encounters:  10/04/20 124.5 kg  10/01/20 126.1 kg  09/15/20 126.1 kg  09/15/20 126.1 kg  06/19/20 128.4 kg  05/07/20 131.5 kg  04/28/20 131.2 kg  02/21/20 130.6 kg  01/17/20 131.5 kg  12/11/19 130.2 kg  10/15/19 127 kg  09/05/19 128.2 kg  06/27/19 125.5 kg  04/15/19 124.3 kg  12/31/18 119.6 kg    Body mass index is 34.31 kg/m. Patient meets criteria for obesity based on current BMI.   Current diet order is heart healthy, patient is consuming approximately 100% of meals at this time. Labs and medications reviewed.   No nutrition interventions warranted at this time. If nutrition issues arise, please consult RD.   Lucas Mallow, RD, LDN, CNSC Please refer to [facility] for contact information.

## 2020-10-04 NOTE — Progress Notes (Signed)
[facility] PROGRESS NOTE    Progress Note  [name]  DGU:[id number] DOB: [birth year] DOA: 10/02/2020 PCP: Ladell Pier, MD     Brief Narrative:   ADARIAN Ortega is an 60 y.o. male past medical history significant for asthma, COPD not on oxygen, chronic hepatitis B, paroxysmal atrial fibrillation on Xarelto with a recent TEE cardioversion on 10/01/2020 ischemic cardiomyopathy status post AICD placement with an EF of 30% comes into the urgent care for nonproductive cough for 1 day.  Assessment/Plan:   Acute respiratory failure with hypoxia (HCC) due to aspiration pneumonia: He was on 2 L all the day yesterday, increased to 4 L overnight question if he has undiagnosed obstructive sleep apnea. Weaned to room air. Likely due to TEE cardioversion. Continue IV Unasyn has remained afebrile leukocytosis is resolved.  COPD : He is not wheezing on physical exam discontinue steroids.  Elevated troponin: Cardiac biomarkers have basically remained flat denies any chest pain likely due to chronic kidney disease going back through his chart he has had mild elevation in his tropes in the past.  Chronic systolic heart failure: Appears euvolemic on physical exam continue current home medications. Resume home dose of Coreg and Lasix.  Chronic kidney disease stage IV: Appears to be at baseline continue to monitor intermittently.  Paroxysmal atrial fibrillation: Status post ablation continue Xarelto, continue amiodarone 200 mg daily and Coreg.    DVT prophylaxis: xarelto Family Communication:none Status is: Observation  The patient will require care spanning > 2 midnights and should be moved to inpatient because: Hemodynamically unstable  Dispo:  Patient From: Home  Planned Disposition: Home  Medically stable for discharge: No       Code Status:     Code Status Orders  (From admission, onward)         Start     Ordered   10/03/20 0030  Full code  Continuous         10/03/20 0029        Code Status History    Date Active Date Inactive Code Status Order ID Comments User Context   12/25/2013 2146 12/29/2013 1233 Full Code [id number]  Orson Eva, MD Inpatient   Advance Care Planning Activity        IV Access:    Peripheral IV   Procedures and diagnostic studies:   DG Chest Portable 1 View  Result Date: 10/02/2020 CLINICAL DATA:  Shortness of breath EXAM: PORTABLE CHEST 1 VIEW COMPARISON:  05/07/2020 FINDINGS: The heart size and mediastinal contours are within normal limits. There is bilateral pulmonary vascular congestion without overt edema. Normal pleural spaces. The visualized skeletal structures are unremarkable. Unchanged position of left chest wall single lead AICD. IMPRESSION: Pulmonary vascular congestion without overt edema. Electronically Signed   By: Ulyses Jarred M.D.   On: 10/02/2020 21:02     Medical Consultants:    None.   Subjective:    Cody Ortega wants to go home.  Objective:    Vitals:   10/03/20 1948 10/04/20 0005 10/04/20 0340 10/04/20 0700  BP: 126/78 140/85 (!) 146/80 (!) 134/104  Pulse: 62 66 82 83  Resp: 19 19 19 18   Temp: 97.6 F (36.4 C) 98.8 F (37.1 C) 99.2 F (37.3 C) 99.5 F (37.5 C)  TempSrc: Oral Oral Oral Oral  SpO2: 92% 95% 90% (!) 88%  Weight:   124.5 kg   Height:       SpO2: (!) 88 % O2 Flow Rate (L/min): 4  L/min   Intake/Output Summary (Last 24 hours) at 10/04/2020 0913 Last data filed at 10/04/2020 0755 Gross per 24 hour  Intake 1317.88 ml  Output 2550 ml  Net -1232.12 ml   Filed Weights   10/02/20 2021 10/03/20 0210 10/04/20 0340  Weight: 126.1 kg 126.4 kg 124.5 kg    Exam: General exam: In no acute distress. Respiratory system: Good air movement and clear to auscultation. Cardiovascular system: S1 & S2 heard, RRR. No JVD. Gastrointestinal system: Abdomen is nondistended, soft and nontender.  Extremities: No pedal edema. Skin: No rashes, lesions or  ulcers Psychiatry: Judgement and insight appear normal. Mood & affect appropriate.   Data Reviewed:    Labs: Basic Metabolic Panel: Recent Labs  Lab 10/02/20 2020 10/03/20 0245 10/04/20 0409  NA 135 139 138  K 3.4* 3.4* 3.3*  CL 101 101 104  CO2 22 26 23   GLUCOSE 110* 88 92  BUN 27* 31* 33*  CREATININE 2.63* 2.70* 2.44*  CALCIUM 8.5* 9.2 9.0  MG  --  2.0  --   PHOS  --  2.6  --    GFR Estimated Creatinine Clearance: 46.9 mL/min (A) (by C-G formula based on SCr of 2.44 mg/dL (H)). Liver Function Tests: No results for input(s): AST, ALT, ALKPHOS, BILITOT, PROT, ALBUMIN in the last 168 hours. No results for input(s): LIPASE, AMYLASE in the last 168 hours. No results for input(s): AMMONIA in the last 168 hours. Coagulation profile No results for input(s): INR, PROTIME in the last 168 hours. COVID-19 Labs  No results for input(s): DDIMER, FERRITIN, LDH, CRP in the last 72 hours.  Lab Results  Component Value Date   SARSCOV2NAA NEGATIVE 10/02/2020   [place] NEGATIVE 01/16/2020    CBC: Recent Labs  Lab 10/02/20 2020 10/03/20 0245  WBC 11.8* 9.2  HGB 15.8 15.5  HCT 46.8 47.2  MCV 94.4 93.7  PLT 218 228   Cardiac Enzymes: No results for input(s): CKTOTAL, CKMB, CKMBINDEX, TROPONINI in the last 168 hours. BNP (last 3 results) Recent Labs    05/08/20 1332  PROBNP 1,120*   CBG: No results for input(s): GLUCAP in the last 168 hours. D-Dimer: No results for input(s): DDIMER in the last 72 hours. Hgb A1c: No results for input(s): HGBA1C in the last 72 hours. Lipid Profile: No results for input(s): CHOL, HDL, LDLCALC, TRIG, CHOLHDL, LDLDIRECT in the last 72 hours. Thyroid function studies: No results for input(s): TSH, T4TOTAL, T3FREE, THYROIDAB in the last 72 hours.  Invalid input(s): FREET3 Anemia work up: No results for input(s): VITAMINB12, FOLATE, FERRITIN, TIBC, IRON, RETICCTPCT in the last 72 hours. Sepsis Labs: Recent Labs  Lab 10/02/20 2020  10/03/20 0245  WBC 11.8* 9.2   Microbiology Recent Results (from the past 240 hour(s))  SARS CORONAVIRUS 2 (TAT 6-24 HRS) Nasopharyngeal Nasopharyngeal Swab     Status: None   Collection Time: 10/02/20  9:28 PM   Specimen: Nasopharyngeal Swab  Result Value Ref Range Status   SARS Coronavirus 2 NEGATIVE NEGATIVE Final    Comment: (NOTE) SARS-CoV-2 target nucleic acids are NOT DETECTED.  The SARS-CoV-2 RNA is generally detectable in upper and lower respiratory specimens during the acute phase of infection. Negative results do not preclude SARS-CoV-2 infection, do not rule out co-infections with other pathogens, and should not be used as the sole basis for treatment or other patient management decisions. Negative results must be combined with clinical observations, patient history, and epidemiological information. The expected result is Negative.  Fact Sheet for  Patients: SugarRoll.be  Fact Sheet for Healthcare Providers: https://www.woods-mathews.com/  This test is not yet approved or cleared by the [place] FDA and  has been authorized for detection and/or diagnosis of SARS-CoV-2 by FDA under an Emergency Use Authorization (EUA). This EUA will remain  in effect (meaning this test can be used) for the duration of the COVID-19 declaration under Se ction 564(b)(1) of the Act, 21 U.S.C. section 360bbb-3(b)(1), unless the authorization is terminated or revoked sooner.  Performed at [facility], [place] [address], [place], [place] [address]      Medications:   . albuterol  2.5 mg Inhalation TID  . amiodarone  200 mg Oral Daily  . carvedilol  12.5 mg Oral BID WC  . dapagliflozin propanediol  10 mg Oral QAC breakfast  . DULoxetine  90 mg Oral Daily  . entecavir  1 mg Oral QODAY  . ezetimibe  10 mg Oral Daily  . ferrous sulfate  325 mg Oral Q breakfast  . furosemide  40 mg Oral Daily  . mometasone-formoterol  2 puff  Inhalation BID  . oxybutynin  5 mg Oral TID  . rivaroxaban  20 mg Oral Q supper  . tamsulosin  0.4 mg Oral BID  . umeclidinium bromide  1 puff Inhalation Daily   Continuous Infusions: . ampicillin-sulbactam (UNASYN) IV 1.5 g (10/04/20 0542)      LOS: 1 day   Charlynne Cousins  [facility]  10/04/2020, 9:13 AM

## 2020-10-05 ENCOUNTER — Inpatient Hospital Stay (HOSPITAL_COMMUNITY): Payer: Medicaid Other

## 2020-10-05 ENCOUNTER — Other Ambulatory Visit: Payer: Self-pay | Admitting: Internal Medicine

## 2020-10-05 ENCOUNTER — Other Ambulatory Visit: Payer: Self-pay

## 2020-10-05 DIAGNOSIS — J9601 Acute respiratory failure with hypoxia: Secondary | ICD-10-CM | POA: Diagnosis not present

## 2020-10-05 DIAGNOSIS — I509 Heart failure, unspecified: Secondary | ICD-10-CM | POA: Diagnosis not present

## 2020-10-05 DIAGNOSIS — I5020 Unspecified systolic (congestive) heart failure: Secondary | ICD-10-CM | POA: Diagnosis not present

## 2020-10-05 DIAGNOSIS — J690 Pneumonitis due to inhalation of food and vomit: Secondary | ICD-10-CM | POA: Diagnosis not present

## 2020-10-05 DIAGNOSIS — I700 Atherosclerosis of aorta: Secondary | ICD-10-CM | POA: Diagnosis not present

## 2020-10-05 DIAGNOSIS — J90 Pleural effusion, not elsewhere classified: Secondary | ICD-10-CM | POA: Diagnosis not present

## 2020-10-05 DIAGNOSIS — J449 Chronic obstructive pulmonary disease, unspecified: Secondary | ICD-10-CM

## 2020-10-05 DIAGNOSIS — I2510 Atherosclerotic heart disease of native coronary artery without angina pectoris: Secondary | ICD-10-CM | POA: Diagnosis not present

## 2020-10-05 DIAGNOSIS — R0600 Dyspnea, unspecified: Secondary | ICD-10-CM | POA: Diagnosis not present

## 2020-10-05 DIAGNOSIS — I517 Cardiomegaly: Secondary | ICD-10-CM | POA: Diagnosis not present

## 2020-10-05 LAB — BASIC METABOLIC PANEL
Anion gap: 9 (ref 5–15)
BUN: 29 mg/dL — ABNORMAL HIGH (ref 6–20)
CO2: 23 mmol/L (ref 22–32)
Calcium: 9 mg/dL (ref 8.9–10.3)
Chloride: 107 mmol/L (ref 98–111)
Creatinine, Ser: 2.12 mg/dL — ABNORMAL HIGH (ref 0.61–1.24)
GFR, Estimated: 35 mL/min — ABNORMAL LOW (ref >60)
Glucose, Bld: 102 mg/dL — ABNORMAL HIGH (ref 70–99)
Potassium: 3.7 mmol/L (ref 3.5–5.1)
Sodium: 139 mmol/L (ref 135–145)

## 2020-10-05 MED ORDER — ENTECAVIR 0.5 MG PO TABS: 0.5000 mg | ORAL_TABLET | ORAL | 1 refills | Status: DC

## 2020-10-05 MED ORDER — FUROSEMIDE 10 MG/ML IJ SOLN
40.0000 mg | Freq: Two times a day (BID) | INTRAMUSCULAR | Status: AC
  Administered 2020-10-05 – 2020-10-06 (×2): 40 mg via INTRAVENOUS
  Filled 2020-10-05 (×2): qty 4

## 2020-10-05 MED ORDER — AMOXICILLIN-POT CLAVULANATE 875-125 MG PO TABS: 1.0000 | ORAL_TABLET | Freq: Two times a day (BID) | ORAL | 0 refills | Status: DC

## 2020-10-05 MED ORDER — SODIUM CHLORIDE 0.9 % IV SOLN
INTRAVENOUS | Status: DC | PRN
  Administered 2020-10-05 – 2020-10-06 (×2): 250 mL via INTRAVENOUS

## 2020-10-05 MED ORDER — ALPRAZOLAM 0.5 MG PO TABS
2.0000 mg | ORAL_TABLET | Freq: Two times a day (BID) | ORAL | Status: DC | PRN
  Administered 2020-10-05 – 2020-10-07 (×4): 2 mg via ORAL
  Filled 2020-10-05 (×4): qty 4

## 2020-10-05 MED ORDER — SYMBICORT 160-4.5 MCG/ACT IN AERO: 2.0000 | INHALATION_SPRAY | Freq: Two times a day (BID) | RESPIRATORY_TRACT | 2 refills | Status: DC

## 2020-10-05 MED ORDER — ONDANSETRON HCL 4 MG/2ML IJ SOLN
4.0000 mg | Freq: Four times a day (QID) | INTRAMUSCULAR | Status: DC | PRN
  Administered 2020-10-05: 4 mg via INTRAVENOUS
  Filled 2020-10-05: qty 2

## 2020-10-05 NOTE — TOC Progression Note (Addendum)
Transition of Care [facility]) - Progression Note    Patient Details  Name: Cody Ortega MRN: [id number] Date of Birth: 02-15-1961  [facility]) CM/SW Contact  Zenon Mayo, RN Phone Number: 10/05/2020, 9:44 AM  Clinical Narrative:    NCM spoke with patient at bedside, he states he will contact his transportation for home.  He also states he is ok with [employer] supplying his oxygen if he needs it.  NCM asked if he would like to be set up with outpatient physical therapy.  NCM made referral to [place] for physical therapy.  Awaiting ambulatory sats  to see if he needs oxygen.        Expected Discharge Plan and Services           Expected Discharge Date: 10/05/20                                     Social Determinants of Health (SDOH) Interventions    Readmission Risk Interventions No flowsheet data found.

## 2020-10-05 NOTE — Progress Notes (Signed)
Physical Therapy Treatment Patient Details Name: Cody Ortega MRN: [id number] DOB: [birth year] Today's Date: 10/05/2020    History of Present Illness Pt is a 60 y/o male transferred to [facility] from urgent care due to intractable nonproductive cough with pleuritic pain worse with coughing when code STEMI was called. 12-lead EKG shows no evidence of ST segment elevation therefore code STEMI cancelled. While getting ready to discharge from ED, pt found to be hypoxic with SpO2 in mid 80s. CXR shows pulmonary vascular congestion without overt edema. PMH  significant for asthma, COPD not on oxygen supplementation at baseline, OSA intolerant of CPAP, chronic hepatitis B, paroxysmal atrial fibrillation on Xarelto with recent TEE directed cardioversion on 10/01/2020, cardiomyopathy status post ICD placement, HFrEF 30 to 35%.    PT Comments    Continuing work on functional mobility and activity tolerance;  Noted plan for dc home today, and session focused on O2 qualifying walk (see other PT note of this date); Has the strength, balance, and coordination for progressive ambulation -- just still with tendency toward significant O2 sat drop with activity; Discussed with RN, Lonn Georgia   Follow Up Recommendations  Outpatient PT     Equipment Recommendations  None recommended by PT    Recommendations for Other Services       Precautions / Restrictions Precautions Precautions: Fall Precaution Comments: O2 sats    Mobility  Bed Mobility Overal bed mobility: Modified Independent                  Transfers Overall transfer level: Needs assistance Equipment used: None Transfers: Sit to/from Stand Sit to Stand: Supervision         General transfer comment: Cues/setup/supervision for O2 line, O2 sat monitor line, and cardiac lines  Ambulation/Gait Ambulation/Gait assistance: Supervision Gait Distance (Feet): 170 Feet Assistive device: IV Pole;None Gait Pattern/deviations: Step-through  pattern;Decreased stride length     General Gait Details: Pt tolerates gait, with and without IV pole. Pt accepts dynamic gait challenges with increased WOB and oxygen desating to as low as 82% with amb on room air, and then needing O2 restarted at 6L, and tehn up to 8L to get O2 sats back to 89-92%   Stairs             Wheelchair Mobility    Modified Rankin (Stroke Patients Only)       Balance     Sitting balance-Leahy Scale: Good       Standing balance-Leahy Scale: Good                              Cognition Arousal/Alertness: Awake/alert Behavior During Therapy: WFL for tasks assessed/performed;Impulsive Overall Cognitive Status: Within Functional Limits for tasks assessed                                 General Comments: Tends to move quickly; needed cues to slow down for lines and leads and setup fo rO2 sat walk      Exercises      General Comments General comments (skin integrity, edema, etc.): See other PT note of this date for O2 sat qualifying note      Pertinent Vitals/Pain Pain Assessment: No/denies pain    Home Living                      Prior Function  PT Goals (current goals can now be found in the care plan section) Acute Rehab PT Goals Patient Stated Goal: Get better and go home. PT Goal Formulation: With patient Time For Goal Achievement: 10/17/20 Potential to Achieve Goals: Good Progress towards PT goals: Progressing toward goals    Frequency    Min 3X/week      PT Plan Current plan remains appropriate    Co-evaluation              AM-PAC PT "6 Clicks" Mobility   Outcome Measure  Help needed turning from your back to your side while in a flat bed without using bedrails?: None Help needed moving from lying on your back to sitting on the side of a flat bed without using bedrails?: None Help needed moving to and from a bed to a chair (including a wheelchair)?: None Help  needed standing up from a chair using your arms (e.g., wheelchair or bedside chair)?: None Help needed to walk in hospital room?: A Little Help needed climbing 3-5 steps with a railing? : A Little 6 Click Score: 22    End of Session Equipment Utilized During Treatment: Gait belt;Oxygen Activity Tolerance: Patient tolerated treatment well Patient left: in bed;with call bell/phone within reach Nurse Communication: Mobility status PT Visit Diagnosis: Other abnormalities of gait and mobility (R26.89);Unsteadiness on feet (R26.81);Difficulty in walking, not elsewhere classified (R26.2)     Time: 5361-4431 PT Time Calculation (min) (ACUTE ONLY): 35 min  Charges:  $Gait Training: 23-37 mins                     Roney Marion, [place]  Acute Rehabilitation Services Pager [phone] Office [place] 10/05/2020, 1:01 PM

## 2020-10-05 NOTE — TOC Transition Note (Signed)
Transition of Care [facility]) - CM/SW Discharge Note   Patient Details  Name: Cody Ortega MRN: [id number] Date of Birth: 1960-12-23  [facility] Care [facility]) CM/SW Contact:  Zenon Mayo, RN Phone Number: 10/05/2020, 11:45 AM   Clinical Narrative:    NCM spoke with patient at bedside, he states he will contact his transportation for home.  He also states he is ok with [employer] supplying his oxygen if he needs it.  NCM asked if he would like to be set up with outpatient physical therapy.  NCM made referral to [place] for physical therapy.  NCM made referral to [facility] with [employer] for home oxygen.     Final next level of care: Home/Self Care Barriers to Discharge: No Barriers Identified   Patient Goals and CMS Choice Patient states their goals for this hospitalization and ongoing recovery are:: return home   Choice offered to / list presented to : NA  Discharge Placement                       Discharge Plan and Services                DME Arranged: Oxygen DME Agency: [employer] Date DME Agency Contacted: 10/05/20 Time DME Agency Contacted: [id number] Representative spoke with at DME Agency: Thedore Mins HH Arranged: NA          Social Determinants of Health ([place]) Interventions     Readmission Risk Interventions No flowsheet data found.

## 2020-10-05 NOTE — Discharge Summary (Signed)
Physician Discharge Summary  Cody Ortega EXB:[id number] DOB: Dec 11, 1960 DOA: 10/02/2020  PCP: Ladell Pier, MD  Admit date: 10/02/2020 Discharge date: 10/05/2020  Admitted From: Home Disposition:  Home  Recommendations for Outpatient Follow-up:  1. Follow up with PCP in 1-2 weeks 2. Please obtain BMP/CBC in one week   Home Health:No Equipment/Devices:Home oxygen for 2 weeks  Discharge Condition:stable CODE STATUS:Full Diet recommendation: Heart Healthy  Brief/Interim Summary: 60 y.o. male past medical history significant for asthma, COPD not on oxygen, chronic hepatitis B, paroxysmal atrial fibrillation on Xarelto with a recent TEE cardioversion on 10/01/2020 ischemic cardiomyopathy status post AICD placement with an EF of 30% comes into the urgent care for nonproductive cough for 1 day.  Discharge Diagnoses:  Active Problems:   Acute respiratory failure with hypoxia (HCC)   Aspiration pneumonia of both lower lobes due to gastric secretions (HCC)   Aspiration pneumonia (HCC)  Acute respiratory failure with hypoxia due to aspiration pneumonia: He was admitted to the hospital started on 4 L of oxygen, started empirically on IV antibiotics de-escalated to IV Unasyn he has defervesced and her saturations have remained stable, there is likely due to TEE he recently had. He will continue Augmentin as an outpatient. Cont oxygen as an outpatient for 2 weeks.  COPD: Continue inhalers no exacerbation admission.  Elevated troponin: They basically remained flat unchanged, he denies any chest pain, likely demand ischemia in the setting of chronic renal disease and hypoxia.  Chronic systolic heart failure: No change made to his medication.  Chronic kidney disease stage IV: Creatinine appears to be at baseline which is made.  Paroxysmal atrial fibrillation: Status post ablation on Xarelto, his amiodarone was decreased to 200 mg daily, continue Coreg.  Rate controlled in sinus  rhythm.   Discharge Instructions  Discharge Instructions    Diet - low sodium heart healthy   Complete by: As directed    Increase activity slowly   Complete by: As directed      Allergies as of 10/05/2020      Reactions   Prochlorperazine Other (See Comments)   Medication caused a lot of issues like blurred vision, nausea, pain, and carpool turnel   Codeine Itching, Nausea And Vomiting, Hives   Hydrocodone-acetaminophen Itching, Nausea And Vomiting      Medication List    TAKE these medications   acetaminophen 500 MG tablet Commonly known as: TYLENOL Take 1,000 mg by mouth every 6 (six) hours as needed for moderate pain or headache.   albuterol 108 (90 Base) MCG/ACT inhaler Commonly known as: VENTOLIN HFA INHALE 2 PUFFS INTO THE LUNGS EVERY 6 HOURS AS NEEDED FOR WHEEZING OR SHORTNESS OF BREATH What changed:   how much to take  how to take this  when to take this  reasons to take this   alprazolam 2 MG tablet Commonly known as: XANAX Take 2 mg by mouth every 6 (six) hours as needed for anxiety.   amiodarone 200 MG tablet Commonly known as: PACERONE Take one tablet by mouth ( 200 mg ) twice daily. What changed:   how much to take  how to take this  when to take this   amLODipine 10 MG tablet Commonly known as: NORVASC TAKE 1 TABLET BY MOUTH EVERY DAY What changed: when to take this   amoxicillin-clavulanate 875-125 MG tablet Commonly known as: Augmentin Take 1 tablet by mouth 2 (two) times daily for 2 days.   carvedilol 12.5 MG tablet Commonly known as: COREG TAKE  1 TABLET(12.5 MG) BY MOUTH TWICE DAILY WITH A MEAL What changed: See the new instructions.   [facility] Natural Tears 0.1-0.3 % Soln Generic drug: Dextran 70-Hypromellose (PF) Place 1 drop into both eyes daily as needed (dry eyes).   dapagliflozin propanediol 10 MG Tabs tablet Commonly known as: Farxiga Take 1 tablet (10 mg total) by mouth daily before breakfast.   diphenhydrAMINE 25 MG  tablet Commonly known as: BENADRYL Take 50 mg by mouth every 6 (six) hours as needed for allergies.   DULoxetine 30 MG capsule Commonly known as: CYMBALTA Take 90 mg by mouth daily.   entecavir 0.5 MG tablet Commonly known as: BARACLUDE Take 1 tablet (0.5 mg total) by mouth every other day. Please note: Every OTHER day What changed:   how much to take  additional instructions   ezetimibe 10 MG tablet Commonly known as: ZETIA TAKE 1 TABLET(10 MG) BY MOUTH DAILY What changed: See the new instructions.   ferrous sulfate 325 (65 FE) MG tablet Take 325 mg by mouth daily with breakfast.   fluticasone 50 MCG/ACT nasal spray Commonly known as: FLONASE SHAKE LIQUID AND USE 1 SPRAY IN EACH NOSTRIL DAILY What changed: See the new instructions.   furosemide 40 MG tablet Commonly known as: LASIX One tablet by mouth ( 40 mg) daily. What changed:   how much to take  how to take this  when to take this   hydrALAZINE 50 MG tablet Commonly known as: APRESOLINE TAKE 1 TABLET(50 MG) BY MOUTH TWICE DAILY What changed: See the new instructions.   ondansetron 8 MG disintegrating tablet Commonly known as: ZOFRAN-ODT Take 8 mg by mouth 2 (two) times daily as needed for nausea or vomiting.   oxybutynin 5 MG tablet Commonly known as: DITROPAN Take 5 mg by mouth 3 (three) times daily.   polyethylene glycol 17 g packet Commonly known as: MiraLax Take as directed, Daily, titrate as needed What changed:   how much to take  how to take this  when to take this  reasons to take this   sildenafil 20 MG tablet Commonly known as: REVATIO Take 2-5 tablets by mouth once daily as needed prior to sexual activity.   Symbicort 160-4.5 MCG/ACT inhaler Generic drug: budesonide-formoterol Inhale 2 puffs into the lungs 2 (two) times daily. What changed: when to take this   tamsulosin 0.4 MG Caps capsule Commonly known as: FLOMAX Take 0.4 mg by mouth 2 (two) times daily.   Xarelto 20  MG Tabs tablet Generic drug: rivaroxaban TAKE 1 TABLET(20 MG) BY MOUTH DAILY WITH SUPPER What changed: See the new instructions.            Durable Medical Equipment  (From admission, onward)         Start     Ordered   Unscheduled  For home use only DME oxygen  Once       Question Answer Comment  Length of Need 6 Months   Mode or (Route) Nasal cannula   Liters per Minute 2   Frequency Continuous (stationary and portable oxygen unit needed)   Oxygen delivery system Gas      10/05/20 0901          Allergies  Allergen Reactions  . Prochlorperazine Other (See Comments)    Medication caused a lot of issues like blurred vision, nausea, pain, and carpool turnel  . Codeine Itching, Nausea And Vomiting and Hives  . Hydrocodone-Acetaminophen Itching and Nausea And Vomiting    Consultations:  None   Procedures/Studies: CT HEAD WO CONTRAST  Result Date: 09/25/2020 [facility] NEUROIM[facility] REPORT STUDY DATE: 09/25/20 PATIENT NAME: Cody Ortega DOB: 1960/09/26 MRN: [id number] ORDERING CLINICIAN: Star Age, MD CLINICAL HISTORY: 60 year old male with heaedaches. EXAM: CT HEAD WO CONTRAST TECHNIQUE:  CT scan of the head was obtained utilizing 5 mm axial slices from the skull base to the vertex. CONTRAST: none COMPARISON: 08/02/04 CT IMAGING SITE: [facility] [address] FINDINGS: The cortical sulci, fissures and cisterns are normal in size and appearance.  Lateral, third and fourth ventricle are normal in size and appearance.  No extra-axial fluid collections are seen.  No intracranial hemorrhage.  No evidence of mass effect or midline shift.  Mild to moderate periventricular and subcortical chronic small vessel ischemic disease.  Right frontal hypodensity may represent chronic infarct.  Dense calcifications noted in bilateral vertebral and internal carotid arteries. The orbits and their contents, paranasal sinuses and calvarium are notable for  mucus opacification of left maxillary sinus.    CT head (without) demonstrating: -Mild to moderate chronic small vessel ischemic disease.  Stable right frontal hypodensity may represent chronic infarct. -No acute findings INTERPRETING PHYSICIAN: Penni Bombard, MD Certified in Neurology, Neurophysiology and Neuroimaging [facility] [address] [place], [place] [address] [phone]   DG Chest Portable 1 View  Result Date: 10/02/2020 CLINICAL DATA:  Shortness of breath EXAM: PORTABLE CHEST 1 VIEW COMPARISON:  05/07/2020 FINDINGS: The heart size and mediastinal contours are within normal limits. There is bilateral pulmonary vascular congestion without overt edema. Normal pleural spaces. The visualized skeletal structures are unremarkable. Unchanged position of left chest wall single lead AICD. IMPRESSION: Pulmonary vascular congestion without overt edema. Electronically Signed   By: Ulyses Jarred M.D.   On: 10/02/2020 21:02   ECHO TEE  Result Date: 10/01/2020    TRANSESOPHOGEAL ECHO REPORT   Patient Name:   Cody Ortega Date of Exam: 10/01/2020 Medical Rec #:  [id number]        Height:       75.0 in Accession #:    [id number]       Weight:       278.0 lb Date of Birth:  11-16-1960        BSA:          2.524 m Patient Age:    73 years         BP:           104/61 mmHg Patient Gender: M                HR:           52 bpm. Exam Location:  Inpatient Procedure: 3D Echo, Transesophageal Echo, Color Doppler and Cardiac Doppler Indications:     Atrial fibrillation I48.91  History:         Patient has prior history of Echocardiogram examinations, most                  recent 05/29/2020. CHF, CAD, Defibrillator, COPD,                  Signs/Symptoms:Shortness of Breath; Risk Factors:Hypertension                  and Sleep Apnea. NICM, Chronic kidney disease, Asthma.  Sonographer:     Darlina Sicilian RDCS Referring Phys:  [id number] Nedra Hai DUNN Diagnosing Phys: Rudean Haskell MD   Sonographer Comments: PROCEDURE:  After discussion of the risks and benefits of a TEE, an informed consent was obtained from the patient. The transesophogeal probe was passed without difficulty through the esophogus of the patient. No local oropharyngeal anesthetic was provided. Sedation performed by different physician. The patient was monitored while under deep sedation. Anesthestetic sedation was provided intravenously by Anesthesiology: 554.84mg  of Propofol, 40mg  of Lidocaine. Image quality was good. The patient's vital signs; including heart rate, blood pressure, and oxygen saturation; remained stable throughout the procedure. The patient developed no complications during the procedure. A successful direct current cardioversion was performed at 200 joules with 1 attempt. IMPRESSIONS  1. Left ventricular ejection fraction, by estimation, is 30 to 35%. Left ventricular ejection fraction by 3D volume is 32 %. The left ventricle has moderately decreased function. The left ventricle demonstrates global hypokinesis. The left ventricular internal cavity size was mildly to moderately dilated.  2. Right ventricular systolic function is mildly reduced. The right ventricular size is normal. In the gastric views, there appears to be a small echodensity on the device lead (2 cm at largest). In evalation of the valve in the bicaval view there does not appears to be evidence of abnormal echodensity (see long series 68). Clinical Correlation advised.  3. Left atrial size was severely dilated. No left atrial/left atrial appendage thrombus was detected.  4. Right atrial size was mildly dilated.  5. The mitral valve is normal in structure. Mild to moderate mitral valve regurgitation, central in nature with VCW 0.5 and with pulmonary vein systolic blunting.  6. The aortic valve is tricuspid. Aortic valve regurgitation is not visualized. FINDINGS  Left Ventricle: Left ventricular ejection fraction, by estimation, is 30 to 35%. Left  ventricular ejection fraction by 3D volume is 32 %. The left ventricle has moderately decreased function. The left ventricle demonstrates global hypokinesis. The left ventricular internal cavity size was mildly to moderately dilated. Right Ventricle: The right ventricular size is normal. Right vetricular wall thickness was not assessed. Right ventricular systolic function is mildly reduced. Left Atrium: Watchman FLX 31 mm could be considered if future LAA- Occluder is pursued. Left atrial size was severely dilated. No left atrial/left atrial appendage thrombus was detected. Right Atrium: Right atrial size was mildly dilated. Pericardium: There is no evidence of pericardial effusion. Mitral Valve: The mitral valve is normal in structure. Mild to moderate mitral valve regurgitation. Tricuspid Valve: The tricuspid valve is normal in structure. Tricuspid valve regurgitation is mild. Aortic Valve: The aortic valve is tricuspid. Aortic valve regurgitation is not visualized. Pulmonic Valve: The pulmonic valve was grossly normal. Pulmonic valve regurgitation is mild. Aorta: The aortic root and ascending aorta are structurally normal, with no evidence of dilitation. IAS/Shunts: The atrial septum is grossly normal. Additional Comments: A device lead is visualized.  LEFT VENTRICLE PLAX 2D LVOT diam:     2.40 cm LVOT Area:     4.52 cm                                3D Volume EF                                LV 3D EF:    Left  ventricular                                             ejection                                             fraction by                                             3D volume                                             is 32 %.                                LV 3D EDV:   207.94 ml                                LV 3D ESV:   139.65 ml                                 3D Volume EF:                                3D EF:        32 %  AORTA Ao Root diam: 3.50 cm Ao  Asc diam:  2.90 cm MR Peak grad:    65.3 mmHg   TRICUSPID VALVE MR Mean grad:    38.0 mmHg   TR Peak grad:   14.7 mmHg MR Vmax:         404.00 cm/s TR Vmax:        192.00 cm/s MR Vmean:        290.0 cm/s MR PISA:         2.26 cm    SHUNTS MR PISA Eff ROA: 17 mm      Systemic Diam: 2.40 cm MR PISA Radius:  0.60 cm Rudean Haskell MD Electronically signed by Rudean Haskell MD Signature Date/Time: 10/01/2020/8:09:39 PM    Final    OCT, Retina - OU - Both Eyes  Result Date: 10/02/2020 Right Eye Quality was good. Central Foveal Thickness: 230. Progression has been stable. 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 Left Eye Quality was good. Central Foveal Thickness: 231. Progression has been stable. Findings include normal foveal contour, no SRF, no IRF, retinal drusen  (Focal ellipsoid disruption inferonasal fovea). 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 CME - cystoid macular edema. PED - pigment  epithelial detachment. IRF - intraretinal fluid. SRF - subretinal fluid. EZ - ellipsoid zone. ERM - epiretinal membrane. ORA - outer retinal atrophy. ORT - outer retinal tubulation. SRHM - subretinal hyper-reflective material      Subjective: No complains  Discharge Exam: Vitals:   10/05/20 0743 10/05/20 0808  BP:  107/61  Pulse:  64  Resp:    Temp:    SpO2: 91% 91%   Vitals:   10/05/20 0740 10/05/20 0742 10/05/20 0743 10/05/20 0808  BP:    107/61  Pulse:    64  Resp:      Temp:      TempSrc:      SpO2: 91% 91% 91% 91%  Weight:      Height:        General: Pt is alert, awake, not in acute  distress Cardiovascular: RRR, S1/S2 +, no rubs, no gallops Respiratory: CTA bilaterally, no wheezing, no rhonchi Abdominal: Soft, NT, ND, bowel sounds + Extremities: no edema, no cyanosis    The results of significant diagnostics from this hospitalization (including imaging, microbiology, ancillary and laboratory) are listed below for reference.     Microbiology: Recent Results (from the past 240 hour(s))  SARS CORONAVIRUS 2 (TAT 6-24 HRS) Nasopharyngeal Nasopharyngeal Swab     Status: None   Collection Time: 10/02/20  9:28 PM   Specimen: Nasopharyngeal Swab  Result Value Ref Range Status   SARS Coronavirus 2 NEGATIVE NEGATIVE Final    Comment: (NOTE) SARS-CoV-2 target nucleic acids are NOT DETECTED.  The SARS-CoV-2 RNA is generally detectable in upper and lower respiratory specimens during the acute phase of infection. Negative results do not preclude SARS-CoV-2 infection, do not rule out co-infections with other pathogens, and should not be used as the sole basis for treatment or other patient management decisions. Negative results must be combined with clinical observations, patient history, and epidemiological information. The expected result is Negative.  Fact Sheet for Patients: SugarRoll.be  Fact Sheet for Healthcare Providers: https://www.woods-mathews.com/  This test is not yet approved or cleared by the [place] FDA and  has been authorized for detection and/or diagnosis of SARS-CoV-2 by FDA under an Emergency Use Authorization (EUA). This EUA will remain  in effect (meaning this test can be used) for the duration of the COVID-19 declaration under Se ction 564(b)(1) of the Act, 21 U.S.C. section 360bbb-3(b)(1), unless the authorization is terminated or revoked sooner.  Performed at [facility], [place] [address], [place], [place] [address]      Labs: BNP (last 3 results) Recent Labs    10/03/20 0228  BNP  564.3*   Basic Metabolic Panel: Recent Labs  Lab 10/02/20 2020 10/03/20 0245 10/04/20 0409  NA 135 139 138  K 3.4* 3.4* 3.3*  CL 101 101 104  CO2 22 26 23   GLUCOSE 110* 88 92  BUN 27* 31* 33*  CREATININE 2.63* 2.70* 2.44*  CALCIUM 8.5* 9.2 9.0  MG  --  2.0  --   PHOS  --  2.6  --    Liver Function Tests: No results for input(s): AST, ALT, ALKPHOS, BILITOT, PROT, ALBUMIN in the last 168 hours. No results for input(s): LIPASE, AMYLASE in the last 168 hours. No results for input(s): AMMONIA in the last 168 hours. CBC: Recent Labs  Lab 10/02/20 2020 10/03/20 0245  WBC 11.8* 9.2  HGB 15.8 15.5  HCT 46.8 47.2  MCV 94.4 93.7  PLT 218 228   Cardiac Enzymes: No results for input(s):  CKTOTAL, CKMB, CKMBINDEX, TROPONINI in the last 168 hours. BNP: Invalid input(s): POCBNP CBG: No results for input(s): GLUCAP in the last 168 hours. D-Dimer No results for input(s): DDIMER in the last 72 hours. Hgb A1c No results for input(s): HGBA1C in the last 72 hours. Lipid Profile No results for input(s): CHOL, HDL, LDLCALC, TRIG, CHOLHDL, LDLDIRECT in the last 72 hours. Thyroid function studies No results for input(s): TSH, T4TOTAL, T3FREE, THYROIDAB in the last 72 hours.  Invalid input(s): FREET3 Anemia work up No results for input(s): VITAMINB12, FOLATE, FERRITIN, TIBC, IRON, RETICCTPCT in the last 72 hours. Urinalysis    Component Value Date/Time   COLORURINE YELLOW 10/03/2020 0559   APPEARANCEUR CLEAR 10/03/2020 0559   LABSPEC 1.008 10/03/2020 0559   PHURINE 5.0 10/03/2020 0559   GLUCOSEU 150 (A) 10/03/2020 0559   HGBUR NEGATIVE 10/03/2020 0559   BILIRUBINUR NEGATIVE 10/03/2020 0559   KETONESUR NEGATIVE 10/03/2020 0559   PROTEINUR NEGATIVE 10/03/2020 0559   UROBILINOGEN 1.0 03/04/2015 2105   NITRITE NEGATIVE 10/03/2020 0559   LEUKOCYTESUR NEGATIVE 10/03/2020 0559   Sepsis Labs Invalid input(s): PROCALCITONIN,  WBC,  LACTICIDVEN Microbiology Recent Results (from the  past 240 hour(s))  SARS CORONAVIRUS 2 (TAT 6-24 HRS) Nasopharyngeal Nasopharyngeal Swab     Status: None   Collection Time: 10/02/20  9:28 PM   Specimen: Nasopharyngeal Swab  Result Value Ref Range Status   SARS Coronavirus 2 NEGATIVE NEGATIVE Final    Comment: (NOTE) SARS-CoV-2 target nucleic acids are NOT DETECTED.  The SARS-CoV-2 RNA is generally detectable in upper and lower respiratory specimens during the acute phase of infection. Negative results do not preclude SARS-CoV-2 infection, do not rule out co-infections with other pathogens, and should not be used as the sole basis for treatment or other patient management decisions. Negative results must be combined with clinical observations, patient history, and epidemiological information. The expected result is Negative.  Fact Sheet for Patients: SugarRoll.be  Fact Sheet for Healthcare Providers: https://www.woods-mathews.com/  This test is not yet approved or cleared by the [place] FDA and  has been authorized for detection and/or diagnosis of SARS-CoV-2 by FDA under an Emergency Use Authorization (EUA). This EUA will remain  in effect (meaning this test can be used) for the duration of the COVID-19 declaration under Se ction 564(b)(1) of the Act, 21 U.S.C. section 360bbb-3(b)(1), unless the authorization is terminated or revoked sooner.  Performed at [facility], [place] [address] [place], [place] [address]      Time coordinating discharge: Over 30 minutes  SIGNED:   Charlynne Cousins, MD  [facility] 10/05/2020, 9:01 AM Pager   If 7PM-7AM, please contact night-coverage [email or web address] Password [email or web address]

## 2020-10-05 NOTE — Progress Notes (Addendum)
Pt's home dose of Alprazolam 2 mg was brought in by pt's wife during the daytime. Med was stored in pharmacy. Pt now requesting his home medication be dispensed to him from the pharmacy. Pt refuses to take the Alprazolam 0.25 mg that is currently ordered. No current order for his home dose. MD paged.

## 2020-10-05 NOTE — Progress Notes (Signed)
Physical Therapy Treatment Note  SATURATION QUALIFICATIONS: (This note is used to comply with regulatory documentation for home oxygen)  Patient Saturations on Room Air at Rest = 85%  Patient Saturations on Room Air while Ambulating = 82%  Patient Saturations on 6, and then increased to 8 Liters of oxygen while Ambulating = 90%  Please briefly explain why patient needs home oxygen:  Patient requires supplemental oxygen to maintain oxygen saturations at acceptable, safe levels with physical activity.  (full PT treatment note to follow)  Roney Marion, [place] Pager [phone] Office [phone]

## 2020-10-05 NOTE — Progress Notes (Signed)
[facility] PROGRESS NOTE    Progress Note  Cody Ortega  TOI:[id number] DOB: [birth year] DOA: 10/02/2020 PCP: Ladell Pier, MD     Brief Narrative:   Cody Ortega is an 60 y.o. male past medical history significant for asthma, COPD not on oxygen, chronic hepatitis B, paroxysmal atrial fibrillation on Xarelto with a recent TEE cardioversion on 10/01/2020 ischemic cardiomyopathy status post AICD placement with an EF of 30% comes into the urgent care for nonproductive cough for 1 day.  Assessment/Plan:   Acute respiratory failure with hypoxia (HCC) due to aspiration pneumonia: His oxygen requirements have increased requiring 4 to 6 L to keep saturation greater than 92%. Check a chest x-ray continue oral Augmentin. Get a chest x-ray he appears euvolemic on physical exam. We will start him on IV Lasix.  COPD : Stable no wheezing on physical exam.  Elevated troponin: Cardiac biomarkers have basically remained flat denies any chest pain likely due to chronic kidney disease going back through his chart he has had mild elevation in his tropes in the past.  Chronic systolic heart failure: Appears euvolemic on physical exam continue current home medications. Continue current home dose of low Coreg, on IV Lasix twice a day  Chronic kidney disease stage IV: Appears to be at baseline continue to monitor intermittently.  Paroxysmal atrial fibrillation: Status post ablation continue Xarelto, continue amiodarone 200 mg daily and Coreg.    DVT prophylaxis: xarelto Family Communication:none Status is: Observation  The patient will require care spanning > 2 midnights and should be moved to inpatient because: Hemodynamically unstable  Dispo:  Patient From: Home  Planned Disposition: Home  Medically stable for discharge: No       Code Status:     Code Status Orders  (From admission, onward)         Start     Ordered   10/03/20 0030  Full code  Continuous         10/03/20 0029        Code Status History    Date Active Date Inactive Code Status Order ID Comments User Context   12/25/2013 2146 12/29/2013 1233 Full Code [id number]  Orson Eva, MD Inpatient   Advance Care Planning Activity        IV Access:    Peripheral IV   Procedures and diagnostic studies:   No results found.   Medical Consultants:    None.   Subjective:    Cody Ortega he relates his breathing is about the same.  Objective:    Vitals:   10/05/20 0808 10/05/20 0920 10/05/20 1220 10/05/20 1329  BP: 107/61 135/75  133/85  Pulse: 64 63  68  Resp:    20  Temp:    98.1 F (36.7 C)  TempSrc:    Oral  SpO2: 91% (!) 88% 92% 90%  Weight:      Height:       SpO2: 90 % O2 Flow Rate (L/min): 4 L/min   Intake/Output Summary (Last 24 hours) at 10/05/2020 1425 Last data filed at 10/05/2020 1329 Gross per 24 hour  Intake 706.4 ml  Output 2100 ml  Net -1393.6 ml   Filed Weights   10/03/20 0210 10/04/20 0340 10/05/20 0128  Weight: 126.4 kg 124.5 kg 122.3 kg    Exam: General exam: In no acute distress. Respiratory system: Good air movement and clear to auscultation. Cardiovascular system: S1 & S2 heard, RRR. No JVD. Gastrointestinal system: Abdomen is nondistended, soft  and nontender.  Extremities: No pedal edema. Skin: No rashes, lesions or ulcers Psychiatry: Judgement and insight appear normal. Mood & affect appropriate.   Data Reviewed:    Labs: Basic Metabolic Panel: Recent Labs  Lab 10/02/20 2020 10/03/20 0245 10/04/20 0409 10/05/20 0747  NA 135 139 138 139  K 3.4* 3.4* 3.3* 3.7  CL 101 101 104 107  CO2 22 26 23 23   GLUCOSE 110* 88 92 102*  BUN 27* 31* 33* 29*  CREATININE 2.63* 2.70* 2.44* 2.12*  CALCIUM 8.5* 9.2 9.0 9.0  MG  --  2.0  --   --   PHOS  --  2.6  --   --    GFR Estimated Creatinine Clearance: 53.5 mL/min (A) (by C-G formula based on SCr of 2.12 mg/dL (H)). Liver Function Tests: No results for input(s): AST,  ALT, ALKPHOS, BILITOT, PROT, ALBUMIN in the last 168 hours. No results for input(s): LIPASE, AMYLASE in the last 168 hours. No results for input(s): AMMONIA in the last 168 hours. Coagulation profile No results for input(s): INR, PROTIME in the last 168 hours. COVID-19 Labs  No results for input(s): DDIMER, FERRITIN, LDH, CRP in the last 72 hours.  Lab Results  Component Value Date   SARSCOV2NAA NEGATIVE 10/02/2020   [place] NEGATIVE 01/16/2020    CBC: Recent Labs  Lab 10/02/20 2020 10/03/20 0245  WBC 11.8* 9.2  HGB 15.8 15.5  HCT 46.8 47.2  MCV 94.4 93.7  PLT 218 228   Cardiac Enzymes: No results for input(s): CKTOTAL, CKMB, CKMBINDEX, TROPONINI in the last 168 hours. BNP (last 3 results) Recent Labs    05/08/20 1332  PROBNP 1,120*   CBG: No results for input(s): GLUCAP in the last 168 hours. D-Dimer: No results for input(s): DDIMER in the last 72 hours. Hgb A1c: No results for input(s): HGBA1C in the last 72 hours. Lipid Profile: No results for input(s): CHOL, HDL, LDLCALC, TRIG, CHOLHDL, LDLDIRECT in the last 72 hours. Thyroid function studies: No results for input(s): TSH, T4TOTAL, T3FREE, THYROIDAB in the last 72 hours.  Invalid input(s): FREET3 Anemia work up: No results for input(s): VITAMINB12, FOLATE, FERRITIN, TIBC, IRON, RETICCTPCT in the last 72 hours. Sepsis Labs: Recent Labs  Lab 10/02/20 2020 10/03/20 0245  WBC 11.8* 9.2   Microbiology Recent Results (from the past 240 hour(s))  SARS CORONAVIRUS 2 (TAT 6-24 HRS) Nasopharyngeal Nasopharyngeal Swab     Status: None   Collection Time: 10/02/20  9:28 PM   Specimen: Nasopharyngeal Swab  Result Value Ref Range Status   SARS Coronavirus 2 NEGATIVE NEGATIVE Final    Comment: (NOTE) SARS-CoV-2 target nucleic acids are NOT DETECTED.  The SARS-CoV-2 RNA is generally detectable in upper and lower respiratory specimens during the acute phase of infection. Negative results do not preclude  SARS-CoV-2 infection, do not rule out co-infections with other pathogens, and should not be used as the sole basis for treatment or other patient management decisions. Negative results must be combined with clinical observations, patient history, and epidemiological information. The expected result is Negative.  Fact Sheet for Patients: SugarRoll.be  Fact Sheet for Healthcare Providers: https://www.woods-mathews.com/  This test is not yet approved or cleared by the [place] FDA and  has been authorized for detection and/or diagnosis of SARS-CoV-2 by FDA under an Emergency Use Authorization (EUA). This EUA will remain  in effect (meaning this test can be used) for the duration of the COVID-19 declaration under Se ction 564(b)(1) of the Act, 21 U.S.C.  section 360bbb-3(b)(1), unless the authorization is terminated or revoked sooner.  Performed at [facility], [place] [address] [place], [place] [address]      Medications:   . amiodarone  200 mg Oral Daily  . carvedilol  12.5 mg Oral BID WC  . dapagliflozin propanediol  10 mg Oral QAC breakfast  . DULoxetine  90 mg Oral Daily  . entecavir  1 mg Oral QODAY  . ezetimibe  10 mg Oral Daily  . ferrous sulfate  325 mg Oral Q breakfast  . furosemide  40 mg Intravenous Q12H  . furosemide  40 mg Oral Daily  . mometasone-formoterol  2 puff Inhalation BID  . oxybutynin  5 mg Oral TID  . rivaroxaban  20 mg Oral Q supper  . tamsulosin  0.4 mg Oral BID  . umeclidinium bromide  1 puff Inhalation Daily   Continuous Infusions: . sodium chloride 10 mL/hr at 10/05/20 0705  . ampicillin-sulbactam (UNASYN) IV 1.5 g (10/05/20 1247)      LOS: 2 days   Charlynne Cousins  [facility]  10/05/2020, 2:25 PM

## 2020-10-06 ENCOUNTER — Telehealth: Payer: Self-pay

## 2020-10-06 DIAGNOSIS — J9601 Acute respiratory failure with hypoxia: Secondary | ICD-10-CM | POA: Diagnosis not present

## 2020-10-06 DIAGNOSIS — J690 Pneumonitis due to inhalation of food and vomit: Secondary | ICD-10-CM | POA: Diagnosis not present

## 2020-10-06 LAB — BASIC METABOLIC PANEL
Anion gap: 10 (ref 5–15)
BUN: 32 mg/dL — ABNORMAL HIGH (ref 6–20)
CO2: 24 mmol/L (ref 22–32)
Calcium: 8.6 mg/dL — ABNORMAL LOW (ref 8.9–10.3)
Chloride: 103 mmol/L (ref 98–111)
Creatinine, Ser: 2.19 mg/dL — ABNORMAL HIGH (ref 0.61–1.24)
GFR, Estimated: 34 mL/min — ABNORMAL LOW (ref >60)
Glucose, Bld: 89 mg/dL (ref 70–99)
Potassium: 3.3 mmol/L — ABNORMAL LOW (ref 3.5–5.1)
Sodium: 137 mmol/L (ref 135–145)

## 2020-10-06 LAB — BRAIN NATRIURETIC PEPTIDE: B Natriuretic Peptide: 589.7 pg/mL — ABNORMAL HIGH (ref 0.0–100.0)

## 2020-10-06 MED ORDER — FUROSEMIDE 10 MG/ML IJ SOLN: 60.0000 mg | Freq: Two times a day (BID) | INTRAMUSCULAR | Status: DC

## 2020-10-06 MED ORDER — POTASSIUM CHLORIDE CRYS ER 20 MEQ PO TBCR
40.0000 meq | EXTENDED_RELEASE_TABLET | Freq: Two times a day (BID) | ORAL | Status: AC
  Administered 2020-10-06 (×2): 40 meq via ORAL
  Filled 2020-10-06 (×2): qty 2

## 2020-10-06 MED ORDER — FUROSEMIDE 10 MG/ML IJ SOLN
80.0000 mg | Freq: Two times a day (BID) | INTRAMUSCULAR | Status: AC
  Administered 2020-10-06 – 2020-10-07 (×2): 80 mg via INTRAVENOUS
  Filled 2020-10-06 (×2): qty 8

## 2020-10-06 NOTE — Discharge Instructions (Signed)

## 2020-10-06 NOTE — Telephone Encounter (Signed)
Per Melina Copa and Dr. Burt Knack, scheduled the patient for evaluation on 11/04/20. He was grateful for call and agrees with plan.

## 2020-10-06 NOTE — Progress Notes (Signed)
[facility] PROGRESS NOTE    Progress Note  Cody Ortega  PZW:[id number] DOB: 10-22-1960 DOA: 10/02/2020 PCP: Ladell Pier, MD     Brief Narrative:   Cody Ortega is an 60 y.o. male past medical history significant for asthma, COPD not on oxygen, chronic hepatitis B, paroxysmal atrial fibrillation on Xarelto with a recent TEE cardioversion on 10/01/2020 ischemic cardiomyopathy status post AICD placement with an EF of 30% comes into the urgent care for nonproductive cough for 1 day.  Assessment/Plan:   Acute respiratory failure with hypoxia (HCC) due to aspiration pneumonia and acute decompensated systolic heart failure: Still requiring 4 L oxygen to keep saturations greater than 90%, continue 5-day course of Augmentin. Continue IV Lasix he is diuresed about 4 L he appears euvolemic on physical exam. Imaging showed pulmonary edema and PE has about 600. Continue to monitor strict I's and O's and daily weights. Monitor electrolytes and replete as needed. Continue Coreg.  COPD : Stable no wheezing on physical exam.  Elevated troponin: Cardiac biomarkers have basically remained flat denies any chest pain likely due to chronic kidney disease going back through his chart he has had mild elevation in his tropes in the past.  Chronic kidney disease stage IV: Appears to be at baseline continue to monitor intermittently.  Paroxysmal atrial fibrillation: Status post ablation continue Xarelto, continue amiodarone 200 mg daily and Coreg.    DVT prophylaxis: xarelto Family Communication:none Status is: Observation  The patient will require care spanning > 2 midnights and should be moved to inpatient because: Hemodynamically unstable  Dispo:  Patient From: Home  Planned Disposition: Home  Medically stable for discharge: No       Code Status:     Code Status Orders  (From admission, onward)         Start     Ordered   10/03/20 0030  Full code  Continuous         10/03/20 0029        Code Status History    Date Active Date Inactive Code Status Order ID Comments User Context   12/25/2013 2146 12/29/2013 1233 Full Code [id number]  Cody Eva, MD Inpatient   Advance Care Planning Activity        IV Access:    Peripheral IV   Procedures and diagnostic studies:   CT CHEST WO CONTRAST  Result Date: 10/05/2020 CLINICAL DATA:  Chronic dyspnea. EXAM: CT CHEST WITHOUT CONTRAST TECHNIQUE: Multidetector CT imaging of the chest was performed following the standard protocol without IV contrast. COMPARISON:  October 02, 2020. FINDINGS: Cardiovascular: Atherosclerosis of thoracic aorta is noted without aneurysm formation. Normal cardiac size. No pericardial effusion. Coronary artery calcifications are noted. Mediastinum/Nodes: No enlarged mediastinal or axillary lymph nodes. Thyroid gland, trachea, and esophagus demonstrate no significant findings. Lungs/Pleura: No pneumothorax is noted. Minimal right pleural effusion is noted. Patchy airspace opacities are noted bilaterally concerning for multifocal pneumonia. Upper Abdomen: Nodular hepatic contours are noted suggesting hepatic cirrhosis. Musculoskeletal: No chest wall mass or suspicious bone lesions identified. IMPRESSION: Multiple patchy airspace opacities are noted bilaterally concerning for multifocal pneumonia. Coronary artery calcifications are noted consistent with coronary artery disease. Findings consistent hepatic cirrhosis. Aortic Atherosclerosis (ICD10-I70.0). Electronically Signed   By: Marijo Conception M.D.   On: 10/05/2020 16:44   DG CHEST PORT 1 VIEW  Result Date: 10/05/2020 CLINICAL DATA:  Dyspnea, CHF EXAM: PORTABLE CHEST 1 VIEW COMPARISON:  10/02/2020 chest radiograph. FINDINGS: Stable configuration of single lead left  subclavian ICD with lead tip overlying the right ventricular apex. Stable cardiomediastinal silhouette with mild cardiomegaly. No pneumothorax. No pleural effusion. Bilateral fluffy  parahilar opacities, moderate to severe on the right and mild on the left, worsened. IMPRESSION: Mild cardiomegaly. Bilateral fluffy parahilar opacities, moderate to severe on the right and mild on the left, worsened, compatible with worsening asymmetric cardiogenic pulmonary edema. Electronically Signed   By: Ilona Sorrel M.D.   On: 10/05/2020 20:39     Medical Consultants:    None.   Subjective:    Cody Ortega moderately flat to sleep.  Relates his breathing is about the same.  Objective:    Vitals:   10/05/20 2017 10/05/20 2019 10/06/20 0514 10/06/20 0816  BP: 125/79  134/85   Pulse: (!) 56  (!) 58   Resp: 20  20   Temp: 98.9 F (37.2 C)  98.3 F (36.8 C)   TempSrc: Oral  Oral   SpO2: 92% 92% 94% 93%  Weight:   120.7 kg   Height:       SpO2: 93 % O2 Flow Rate (L/min): 5 L/min   Intake/Output Summary (Last 24 hours) at 10/06/2020 1030 Last data filed at 10/06/2020 0752 Gross per 24 hour  Intake 1277.79 ml  Output 2950 ml  Net -1672.21 ml   Filed Weights   10/04/20 0340 10/05/20 0128 10/06/20 0514  Weight: 124.5 kg 122.3 kg 120.7 kg    Exam: General exam: In no acute distress. Respiratory system: Good air movement and clear to auscultation. Cardiovascular system: S1 & S2 heard, RRR. No JVD. Gastrointestinal system: Abdomen is nondistended, soft and nontender.  Extremities: No pedal edema. Skin: No rashes, lesions or ulcers Psychiatry: Judgement and insight appear normal. Mood & affect appropriate.   Data Reviewed:    Labs: Basic Metabolic Panel: Recent Labs  Lab 10/02/20 2020 10/03/20 0245 10/04/20 0409 10/05/20 0747 10/06/20 0348  NA 135 139 138 139 137  K 3.4* 3.4* 3.3* 3.7 3.3*  CL 101 101 104 107 103  CO2 22 26 23 23 24   GLUCOSE 110* 88 92 102* 89  BUN 27* 31* 33* 29* 32*  CREATININE 2.63* 2.70* 2.44* 2.12* 2.19*  CALCIUM 8.5* 9.2 9.0 9.0 8.6*  MG  --  2.0  --   --   --   PHOS  --  2.6  --   --   --    GFR Estimated Creatinine  Clearance: 51.5 mL/min (A) (by C-G formula based on SCr of 2.19 mg/dL (H)). Liver Function Tests: No results for input(s): AST, ALT, ALKPHOS, BILITOT, PROT, ALBUMIN in the last 168 hours. No results for input(s): LIPASE, AMYLASE in the last 168 hours. No results for input(s): AMMONIA in the last 168 hours. Coagulation profile No results for input(s): INR, PROTIME in the last 168 hours. COVID-19 Labs  No results for input(s): DDIMER, FERRITIN, LDH, CRP in the last 72 hours.  Lab Results  Component Value Date   SARSCOV2NAA NEGATIVE 10/02/2020   [place] NEGATIVE 01/16/2020    CBC: Recent Labs  Lab 10/02/20 2020 10/03/20 0245  WBC 11.8* 9.2  HGB 15.8 15.5  HCT 46.8 47.2  MCV 94.4 93.7  PLT 218 228   Cardiac Enzymes: No results for input(s): CKTOTAL, CKMB, CKMBINDEX, TROPONINI in the last 168 hours. BNP (last 3 results) Recent Labs    05/08/20 1332  PROBNP 1,120*   CBG: No results for input(s): GLUCAP in the last 168 hours. D-Dimer: No results for input(s): DDIMER  in the last 72 hours. Hgb A1c: No results for input(s): HGBA1C in the last 72 hours. Lipid Profile: No results for input(s): CHOL, HDL, LDLCALC, TRIG, CHOLHDL, LDLDIRECT in the last 72 hours. Thyroid function studies: No results for input(s): TSH, T4TOTAL, T3FREE, THYROIDAB in the last 72 hours.  Invalid input(s): FREET3 Anemia work up: No results for input(s): VITAMINB12, FOLATE, FERRITIN, TIBC, IRON, RETICCTPCT in the last 72 hours. Sepsis Labs: Recent Labs  Lab 10/02/20 2020 10/03/20 0245  WBC 11.8* 9.2   Microbiology Recent Results (from the past 240 hour(s))  SARS CORONAVIRUS 2 (TAT 6-24 HRS) Nasopharyngeal Nasopharyngeal Swab     Status: None   Collection Time: 10/02/20  9:28 PM   Specimen: Nasopharyngeal Swab  Result Value Ref Range Status   SARS Coronavirus 2 NEGATIVE NEGATIVE Final    Comment: (NOTE) SARS-CoV-2 target nucleic acids are NOT DETECTED.  The SARS-CoV-2 RNA is generally  detectable in upper and lower respiratory specimens during the acute phase of infection. Negative results do not preclude SARS-CoV-2 infection, do not rule out co-infections with other pathogens, and should not be used as the sole basis for treatment or other patient management decisions. Negative results must be combined with clinical observations, patient history, and epidemiological information. The expected result is Negative.  Fact Sheet for Patients: SugarRoll.be  Fact Sheet for Healthcare Providers: https://www.woods-mathews.com/  This test is not yet approved or cleared by the [place] FDA and  has been authorized for detection and/or diagnosis of SARS-CoV-2 by FDA under an Emergency Use Authorization (EUA). This EUA will remain  in effect (meaning this test can be used) for the duration of the COVID-19 declaration under Se ction 564(b)(1) of the Act, 21 U.S.C. section 360bbb-3(b)(1), unless the authorization is terminated or revoked sooner.  Performed at [facility], [place] [address] [place], [place] [address]      Medications:   . amiodarone  200 mg Oral Daily  . carvedilol  12.5 mg Oral BID WC  . dapagliflozin propanediol  10 mg Oral QAC breakfast  . DULoxetine  90 mg Oral Daily  . entecavir  1 mg Oral QODAY  . ezetimibe  10 mg Oral Daily  . ferrous sulfate  325 mg Oral Q breakfast  . furosemide  60 mg Intravenous Q12H  . mometasone-formoterol  2 puff Inhalation BID  . oxybutynin  5 mg Oral TID  . potassium chloride  40 mEq Oral BID  . rivaroxaban  20 mg Oral Q supper  . tamsulosin  0.4 mg Oral BID  . umeclidinium bromide  1 puff Inhalation Daily   Continuous Infusions: . sodium chloride Stopped (10/06/20 0721)  . ampicillin-sulbactam (UNASYN) IV Stopped (10/06/20 0715)      LOS: 3 days   Charlynne Cousins  [facility]  10/06/2020, 10:30 AM

## 2020-10-06 NOTE — Progress Notes (Signed)
Pt refusing telemetry and continuous O2 monitoring at this time while he sleeps. Will continue to monitor.

## 2020-10-07 ENCOUNTER — Encounter: Payer: Self-pay | Admitting: Internal Medicine

## 2020-10-07 DIAGNOSIS — J9601 Acute respiratory failure with hypoxia: Secondary | ICD-10-CM | POA: Diagnosis not present

## 2020-10-07 DIAGNOSIS — J690 Pneumonitis due to inhalation of food and vomit: Secondary | ICD-10-CM | POA: Diagnosis not present

## 2020-10-07 LAB — BASIC METABOLIC PANEL
Anion gap: 13 (ref 5–15)
BUN: 40 mg/dL — ABNORMAL HIGH (ref 6–20)
CO2: 24 mmol/L (ref 22–32)
Calcium: 9.1 mg/dL (ref 8.9–10.3)
Chloride: 102 mmol/L (ref 98–111)
Creatinine, Ser: 2.17 mg/dL — ABNORMAL HIGH (ref 0.61–1.24)
GFR, Estimated: 34 mL/min — ABNORMAL LOW (ref >60)
Glucose, Bld: 101 mg/dL — ABNORMAL HIGH (ref 70–99)
Potassium: 3.5 mmol/L (ref 3.5–5.1)
Sodium: 139 mmol/L (ref 135–145)

## 2020-10-07 MED ORDER — AMOXICILLIN-POT CLAVULANATE 875-125 MG PO TABS: 1.0000 | ORAL_TABLET | Freq: Once | ORAL | Status: DC

## 2020-10-07 NOTE — Telephone Encounter (Signed)
Pt is returning call from 10/06/20. Pt is currently in the hospital. Please advise

## 2020-10-07 NOTE — Discharge Summary (Signed)
Physician Discharge Summary  Cody Ortega LKG:[id number] DOB: 05/24/1960 DOA: 10/02/2020  PCP: Ladell Pier, MD  Admit date: 10/02/2020 Discharge date: 10/07/2020  Time spent: 50* minutes  Recommendations for Outpatient Follow-up:  1. Follow-up PCP in 1 week 2. Patient to be discharged on home O2 at 3 L/min   Discharge Diagnoses:  Active Problems:   Acute respiratory failure with hypoxia (HCC)   Aspiration pneumonia of both lower lobes due to gastric secretions (HCC)   Aspiration pneumonia [facility])   Discharge Condition: Stable  Diet recommendation: Heart healthy diet  Filed Weights   10/05/20 0128 10/06/20 0514 10/07/20 0451  Weight: 122.3 kg 120.7 kg 118.2 kg    History of present illness:  60 year old male with medical history of asthma, COPD not on oxygen, chronic otitis B, paroxysmal atrial fibrillation on Xarelto with recent TEE cardioversion on 10/01/2020, ischemic cardiomyopathy s/p AICD placement, EF of 30% came to urgent care for nonproductive cough for 1 day duration.  Hospital Course:   Acute respiratory failure with hypoxemia -Resolved -Secondary to aspiration pneumonia, acute decompensated systolic heart failure -Completed 5 days of antibiotic treatment in the hospital -He will need home oxygen 3 L/min  Mild pulmonary edema -Significantly improved with diuresis -Continue Lasix 40 mg daily at home  HFrEF -EF 30 to 35%, left ventricle demonstrate global hypokinesis -Mild mitral valve regurgitation -BNP 908 on 10/03/2020 -Continue home medications including Lasix 40 mg daily   CKD stage IV -Creatinine 2.17, at baseline   Discharge Exam: Vitals:   10/07/20 0934 10/07/20 1144  BP:  (!) 144/80  Pulse: (!) 57 (!) 58  Resp:  20  Temp:  98.3 F (36.8 C)  SpO2:  92%    General: Appears in no acute distress Cardiovascular: S1-S2, regular Respiratory: Clear to auscultation bilaterally  Discharge Instructions   Discharge Instructions    Diet -  low sodium heart healthy   Complete by: As directed    Increase activity slowly   Complete by: As directed    Increase activity slowly   Complete by: As directed      Allergies as of 10/07/2020      Reactions   Prochlorperazine Other (See Comments)   Medication caused a lot of issues like blurred vision, nausea, pain, and carpool turnel   Codeine Itching, Nausea And Vomiting, Hives   Hydrocodone-acetaminophen Itching, Nausea And Vomiting      Medication List    TAKE these medications   acetaminophen 500 MG tablet Commonly known as: TYLENOL Take 1,000 mg by mouth every 6 (six) hours as needed for moderate pain or headache.   albuterol 108 (90 Base) MCG/ACT inhaler Commonly known as: VENTOLIN HFA INHALE 2 PUFFS INTO THE LUNGS EVERY 6 HOURS AS NEEDED FOR WHEEZING OR SHORTNESS OF BREATH What changed:   how much to take  how to take this  when to take this  reasons to take this   alprazolam 2 MG tablet Commonly known as: XANAX Take 2 mg by mouth every 6 (six) hours as needed for anxiety.   amiodarone 200 MG tablet Commonly known as: PACERONE Take one tablet by mouth ( 200 mg ) twice daily. What changed:   how much to take  how to take this  when to take this   amLODipine 10 MG tablet Commonly known as: NORVASC TAKE 1 TABLET BY MOUTH EVERY DAY What changed: when to take this   carvedilol 12.5 MG tablet Commonly known as: COREG TAKE 1 TABLET(12.5 MG) BY  MOUTH TWICE DAILY WITH A MEAL What changed: See the new instructions.   [facility] Natural Tears 0.1-0.3 % Soln Generic drug: Dextran 70-Hypromellose (PF) Place 1 drop into both eyes daily as needed (dry eyes).   dapagliflozin propanediol 10 MG Tabs tablet Commonly known as: Farxiga Take 1 tablet (10 mg total) by mouth daily before breakfast.   diphenhydrAMINE 25 MG tablet Commonly known as: BENADRYL Take 50 mg by mouth every 6 (six) hours as needed for allergies.   DULoxetine 30 MG capsule Commonly known as:  CYMBALTA Take 90 mg by mouth daily.   entecavir 0.5 MG tablet Commonly known as: BARACLUDE Take 1 tablet (0.5 mg total) by mouth every other day. Please note: Every OTHER day What changed:   how much to take  additional instructions   ezetimibe 10 MG tablet Commonly known as: ZETIA TAKE 1 TABLET(10 MG) BY MOUTH DAILY What changed: See the new instructions.   ferrous sulfate 325 (65 FE) MG tablet Take 325 mg by mouth daily with breakfast.   fluticasone 50 MCG/ACT nasal spray Commonly known as: FLONASE SHAKE LIQUID AND USE 1 SPRAY IN EACH NOSTRIL DAILY What changed: See the new instructions.   furosemide 40 MG tablet Commonly known as: LASIX One tablet by mouth ( 40 mg) daily. What changed:   how much to take  how to take this  when to take this   hydrALAZINE 50 MG tablet Commonly known as: APRESOLINE TAKE 1 TABLET(50 MG) BY MOUTH TWICE DAILY What changed: See the new instructions.   ondansetron 8 MG disintegrating tablet Commonly known as: ZOFRAN-ODT Take 8 mg by mouth 2 (two) times daily as needed for nausea or vomiting.   oxybutynin 5 MG tablet Commonly known as: DITROPAN Take 5 mg by mouth 3 (three) times daily.   polyethylene glycol 17 g packet Commonly known as: MiraLax Take as directed, Daily, titrate as needed What changed:   how much to take  how to take this  when to take this  reasons to take this   sildenafil 20 MG tablet Commonly known as: REVATIO Take 2-5 tablets by mouth once daily as needed prior to sexual activity.   Symbicort 160-4.5 MCG/ACT inhaler Generic drug: budesonide-formoterol Inhale 2 puffs into the lungs 2 (two) times daily. What changed: when to take this   tamsulosin 0.4 MG Caps capsule Commonly known as: FLOMAX Take 0.4 mg by mouth 2 (two) times daily.   Xarelto 20 MG Tabs tablet Generic drug: rivaroxaban TAKE 1 TABLET(20 MG) BY MOUTH DAILY WITH SUPPER What changed: See the new instructions.             Durable Medical Equipment  (From admission, onward)         Start     Ordered   10/05/20 1159  For home use only DME oxygen  Once       Question Answer Comment  Length of Need 6 Months   Mode or (Route) Nasal cannula   Liters per Minute 4   Frequency Continuous (stationary and portable oxygen unit needed)   Oxygen delivery system Gas      10/05/20 1158         Allergies  Allergen Reactions  . Prochlorperazine Other (See Comments)    Medication caused a lot of issues like blurred vision, nausea, pain, and carpool turnel  . Codeine Itching, Nausea And Vomiting and Hives  . Hydrocodone-Acetaminophen Itching and Nausea And Vomiting    Follow-up Information    [facility] Go on 10/15/2020.   Why: @10 :30am Contact information: [place] [id number] [phone]       [facility] Follow up.   Specialty: Rehabilitation Why: they will contact you with apt times, if you do not hear from them please contact them at the phone number above. Contact information: [address] [phone] [place] [facility] Follow up.   Why: oxygen Contact information: [place] [address] [phone]                The results of significant diagnostics from this hospitalization (including imaging, microbiology, ancillary and laboratory) are listed below for reference.    Significant Diagnostic Studies: CT HEAD WO CONTRAST  Result Date: 09/25/2020 [facility] NEUROIMAG[facility] REPORT STUDY DATE: 09/25/20 PATIENT NAME: ALEISTER LADY DOB: August 30, 1960 MRN: [id number] ORDERING CLINICIAN: Star Age, MD CLINICAL HISTORY: 60 year old male with heaedaches. EXAM: CT HEAD WO CONTRAST TECHNIQUE:  CT scan of the head was obtained utilizing 5 mm axial slices from the skull base to the vertex.  CONTRAST: none COMPARISON: 08/02/04 CT IMAGING SITE: [facility] [address] FINDINGS: The cortical sulci, fissures and cisterns are normal in size and appearance.  Lateral, third and fourth ventricle are normal in size and appearance.  No extra-axial fluid collections are seen.  No intracranial hemorrhage.  No evidence of mass effect or midline shift.  Mild to moderate periventricular and subcortical chronic small vessel ischemic disease.  Right frontal hypodensity may represent chronic infarct.  Dense calcifications noted in bilateral vertebral and internal carotid arteries. The orbits and their contents, paranasal sinuses and calvarium are notable for mucus opacification of left maxillary sinus.    CT head (without) demonstrating: -Mild to moderate chronic small vessel ischemic disease.  Stable right frontal hypodensity may represent chronic infarct. -No acute findings INTERPRETING PHYSICIAN: Penni Bombard, MD Certified in Neurology, Neurophysiology and Neuroimaging [facility] [address] [place], [place] [address] [phone]   CT CHEST WO CONTRAST  Result Date: 10/05/2020 CLINICAL DATA:  Chronic dyspnea. EXAM: CT CHEST WITHOUT CONTRAST TECHNIQUE: Multidetector CT imaging of the chest was performed following the standard protocol without IV contrast. COMPARISON:  October 02, 2020. FINDINGS: Cardiovascular: Atherosclerosis of thoracic aorta is noted without aneurysm formation. Normal cardiac size. No pericardial effusion. Coronary artery calcifications are noted. Mediastinum/Nodes: No enlarged mediastinal or axillary lymph nodes. Thyroid gland, trachea, and esophagus demonstrate no significant findings. Lungs/Pleura: No pneumothorax is noted. Minimal right pleural effusion is noted. Patchy airspace opacities are noted bilaterally concerning for multifocal pneumonia. Upper Abdomen: Nodular hepatic contours are noted suggesting hepatic cirrhosis.  Musculoskeletal: No chest wall mass or suspicious bone lesions identified. IMPRESSION: Multiple patchy airspace opacities are noted bilaterally concerning for multifocal pneumonia. Coronary artery calcifications are noted consistent with coronary artery disease. Findings consistent hepatic cirrhosis. Aortic Atherosclerosis (ICD10-I70.0). Electronically Signed   By: Marijo Conception M.D.   On: 10/05/2020 16:44   DG CHEST PORT 1 VIEW  Result Date: 10/05/2020 CLINICAL DATA:  Dyspnea, CHF EXAM: PORTABLE CHEST 1 VIEW COMPARISON:  10/02/2020 chest radiograph. FINDINGS: Stable configuration of single lead left subclavian ICD with lead tip overlying the right ventricular apex. Stable cardiomediastinal silhouette with mild cardiomegaly. No pneumothorax. No pleural effusion. Bilateral fluffy parahilar opacities, moderate to severe on the right and mild on the left, worsened. IMPRESSION:  Mild cardiomegaly. Bilateral fluffy parahilar opacities, moderate to severe on the right and mild on the left, worsened, compatible with worsening asymmetric cardiogenic pulmonary edema. Electronically Signed   By: Ilona Sorrel M.D.   On: 10/05/2020 20:39   DG Chest Portable 1 View  Result Date: 10/02/2020 CLINICAL DATA:  Shortness of breath EXAM: PORTABLE CHEST 1 VIEW COMPARISON:  05/07/2020 FINDINGS: The heart size and mediastinal contours are within normal limits. There is bilateral pulmonary vascular congestion without overt edema. Normal pleural spaces. The visualized skeletal structures are unremarkable. Unchanged position of left chest wall single lead AICD. IMPRESSION: Pulmonary vascular congestion without overt edema. Electronically Signed   By: Ulyses Jarred M.D.   On: 10/02/2020 21:02   ECHO TEE  Result Date: 10/01/2020    TRANSESOPHOGEAL ECHO REPORT   Patient Name:   RAYMIR FROMMELT Date of Exam: 10/01/2020 Medical Rec #:  [id number]        Height:       75.0 in Accession #:    [id number]       Weight:       278.0 lb Date of  Birth:  Feb 17, 1961        BSA:          2.524 m Patient Age:    56 years         BP:           104/61 mmHg Patient Gender: M                HR:           52 bpm. Exam Location:  Inpatient Procedure: 3D Echo, Transesophageal Echo, Color Doppler and Cardiac Doppler Indications:     Atrial fibrillation I48.91  History:         Patient has prior history of Echocardiogram examinations, most                  recent 05/29/2020. CHF, CAD, Defibrillator, COPD,                  Signs/Symptoms:Shortness of Breath; Risk Factors:Hypertension                  and Sleep Apnea. NICM, Chronic kidney disease, Asthma.  Sonographer:     Darlina Sicilian RDCS Referring Phys:  [id number] [place] Diagnosing Phys: Rudean Haskell MD  Sonographer Comments: PROCEDURE: After discussion of the risks and benefits of a TEE, an informed consent was obtained from the patient. The transesophogeal probe was passed without difficulty through the esophogus of the patient. No local oropharyngeal anesthetic was provided. Sedation performed by different physician. The patient was monitored while under deep sedation. Anesthestetic sedation was provided intravenously by Anesthesiology: 554.84mg  of Propofol, 40mg  of Lidocaine. Image quality was good. The patient's vital signs; including heart rate, blood pressure, and oxygen saturation; remained stable throughout the procedure. The patient developed no complications during the procedure. A successful direct current cardioversion was performed at 200 joules with 1 attempt. IMPRESSIONS  1. Left ventricular ejection fraction, by estimation, is 30 to 35%. Left ventricular ejection fraction by 3D volume is 32 %. The left ventricle has moderately decreased function. The left ventricle demonstrates global hypokinesis. The left ventricular internal cavity size was mildly to moderately dilated.  2. Right ventricular systolic function is mildly reduced. The right ventricular size is normal. In the gastric views,  there appears to be a small echodensity on the device lead (2 cm at largest). In evalation  of the valve in the bicaval view there does not appears to be evidence of abnormal echodensity (see long series 68). Clinical Correlation advised.  3. Left atrial size was severely dilated. No left atrial/left atrial appendage thrombus was detected.  4. Right atrial size was mildly dilated.  5. The mitral valve is normal in structure. Mild to moderate mitral valve regurgitation, central in nature with VCW 0.5 and with pulmonary vein systolic blunting.  6. The aortic valve is tricuspid. Aortic valve regurgitation is not visualized. FINDINGS  Left Ventricle: Left ventricular ejection fraction, by estimation, is 30 to 35%. Left ventricular ejection fraction by 3D volume is 32 %. The left ventricle has moderately decreased function. The left ventricle demonstrates global hypokinesis. The left ventricular internal cavity size was mildly to moderately dilated. Right Ventricle: The right ventricular size is normal. Right vetricular wall thickness was not assessed. Right ventricular systolic function is mildly reduced. Left Atrium: Watchman FLX 31 mm could be considered if future LAA- Occluder is pursued. Left atrial size was severely dilated. No left atrial/left atrial appendage thrombus was detected. Right Atrium: Right atrial size was mildly dilated. Pericardium: There is no evidence of pericardial effusion. Mitral Valve: The mitral valve is normal in structure. Mild to moderate mitral valve regurgitation. Tricuspid Valve: The tricuspid valve is normal in structure. Tricuspid valve regurgitation is mild. Aortic Valve: The aortic valve is tricuspid. Aortic valve regurgitation is not visualized. Pulmonic Valve: The pulmonic valve was grossly normal. Pulmonic valve regurgitation is mild. Aorta: The aortic root and ascending aorta are structurally normal, with no evidence of dilitation. IAS/Shunts: The atrial septum is grossly normal.  Additional Comments: A device lead is visualized.  LEFT VENTRICLE PLAX 2D LVOT diam:     2.40 cm LVOT Area:     4.52 cm                                3D Volume EF                                LV 3D EF:    Left                                             ventricular                                             ejection                                             fraction by                                             3D volume  is 32 %.                                LV 3D EDV:   207.94 ml                                LV 3D ESV:   139.65 ml                                 3D Volume EF:                                3D EF:        32 %  AORTA Ao Root diam: 3.50 cm Ao Asc diam:  2.90 cm MR Peak grad:    65.3 mmHg   TRICUSPID VALVE MR Mean grad:    38.0 mmHg   TR Peak grad:   14.7 mmHg MR Vmax:         404.00 cm/s TR Vmax:        192.00 cm/s MR Vmean:        290.0 cm/s MR PISA:         2.26 cm    SHUNTS MR PISA Eff ROA: 17 mm      Systemic Diam: 2.40 cm MR PISA Radius:  0.60 cm Rudean Haskell MD Electronically signed by Rudean Haskell MD Signature Date/Time: 10/01/2020/8:09:39 PM    Final    OCT, Retina - OU - Both Eyes  Result Date: 10/02/2020 Right Eye Quality was good. Central Foveal Thickness: 230. Progression has been stable. 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 Left Eye Quality was good. Central Foveal Thickness: 231. Progression has been stable. Findings include normal foveal contour, no SRF, no IRF, retinal drusen  (Focal ellipsoid disruption inferonasal fovea). 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 CME - cystoid macular edema. PED - pigment epithelial detachment. IRF - intraretinal fluid. SRF - subretinal fluid. EZ - ellipsoid zone. ERM - epiretinal membrane. ORA - outer retinal atrophy. ORT - outer retinal tubulation. SRHM - subretinal hyper-reflective material    Microbiology: Recent Results (from the past 240 hour(s))  SARS CORONAVIRUS 2 (TAT 6-24 HRS) Nasopharyngeal Nasopharyngeal Swab     Status: None   Collection Time: 10/02/20  9:28 PM   Specimen: Nasopharyngeal Swab  Result Value Ref Range Status   SARS Coronavirus 2 NEGATIVE NEGATIVE Final    Comment: (NOTE) SARS-CoV-2 target nucleic acids are NOT DETECTED.  The SARS-CoV-2 RNA is generally detectable in upper and lower respiratory specimens during the acute phase of infection. Negative results do not preclude SARS-CoV-2 infection, do not rule out co-infections with other pathogens, and should not be used as the sole basis for treatment or other patient management decisions. Negative results must be combined with clinical observations, patient history, and epidemiological information. The expected result is Negative.  Fact Sheet for Patients: SugarRoll.be  Fact Sheet for Healthcare Providers: https://www.woods-mathews.com/  This  test is not yet approved or cleared by the [place] and  has been authorized for detection and/or diagnosis of SARS-CoV-2 by FDA under an Emergency Use Authorization (EUA). This EUA will remain  in effect (meaning this test can be used) for the duration of the COVID-19 declaration under Se ction 564(b)(1) of the Act, 21 U.S.C. section 360bbb-3(b)(1), unless the authorization is terminated or revoked sooner.  Performed at [facility], [place] [address], [place], [place] [address]      Labs: Basic Metabolic Panel: Recent Labs  Lab 10/03/20 0245 10/04/20 0409 10/05/20 0747 10/06/20 0348  10/07/20 0303  NA 139 138 139 137 139  K 3.4* 3.3* 3.7 3.3* 3.5  CL 101 104 107 103 102  CO2 26 23 23 24 24   GLUCOSE 88 92 102* 89 101*  BUN 31* 33* 29* 32* 40*  CREATININE 2.70* 2.44* 2.12* 2.19* 2.17*  CALCIUM 9.2 9.0 9.0 8.6* 9.1  MG 2.0  --   --   --   --   PHOS 2.6  --   --   --   --    Liver Function Tests: No results for input(s): AST, ALT, ALKPHOS, BILITOT, PROT, ALBUMIN in the last 168 hours. No results for input(s): LIPASE, AMYLASE in the last 168 hours. No results for input(s): AMMONIA in the last 168 hours. CBC: Recent Labs  Lab 10/02/20 2020 10/03/20 0245  WBC 11.8* 9.2  HGB 15.8 15.5  HCT 46.8 47.2  MCV 94.4 93.7  PLT 218 228   Cardiac Enzymes: No results for input(s): CKTOTAL, CKMB, CKMBINDEX, TROPONINI in the last 168 hours. BNP: BNP (last 3 results) Recent Labs    10/03/20 0228 10/06/20 0606  BNP 908.8* 589.7*    ProBNP (last 3 results) Recent Labs    05/08/20 1332  PROBNP 1,120*        Signed:  Oswald Hillock MD.  [facility] 10/07/2020, 1:34 PM

## 2020-10-07 NOTE — Plan of Care (Signed)
  Problem: Clinical Measurements: Goal: Respiratory complications will improve Outcome: Progressing   Problem: Activity: Goal: Risk for activity intolerance will decrease Outcome: Progressing   

## 2020-10-07 NOTE — Telephone Encounter (Signed)
The patient called to confirm upcoming visits.  He was grateful for call and agrees with plan.

## 2020-10-07 NOTE — Progress Notes (Signed)
SATURATION QUALIFICATIONS: (This note is used to comply with regulatory documentation for home oxygen)  Patient Saturations on Room Air at Rest = 88%  Patient Saturations on Room Air while Ambulating = 86%  Patient Saturations on 3 Liters of oxygen while Ambulating = 88-91%  Please briefly explain why patient needs home oxygen: Pt continues to desaturate when mobilizing on room air. Supplemental oxygen use will allow for increased endurance and activity tolerance during mobility and ADLs.

## 2020-10-07 NOTE — Progress Notes (Signed)
Physical Therapy Treatment Patient Details Name: Cody Ortega MRN: [id number] DOB: [birth year] Today's Date: 10/07/2020    History of Present Illness Pt is a 60 y/o male transferred to [facility] from urgent care due to intractable nonproductive cough with pleuritic pain worse with coughing when code STEMI was called. 12-lead EKG shows no evidence of ST segment elevation therefore code STEMI cancelled. While getting ready to discharge from ED, pt found to be hypoxic with SpO2 in mid 80s. CXR shows pulmonary vascular congestion without overt edema. PMH  significant for asthma, COPD not on oxygen supplementation at baseline, OSA intolerant of CPAP, chronic hepatitis B, paroxysmal atrial fibrillation on Xarelto with recent TEE directed cardioversion on 10/01/2020, cardiomyopathy status post ICD placement, HFrEF 30 to 35%.    PT Comments    Pt tolerates ambulation for longer distances, with and without use of IV pole, compared to last session. Pt accepts dynamic gait challenges, including scanning and navigation of obstacles. Pt experiences 1 LOB during gait and is able to self-correct. Pt tolerates stair negotiation during session and reports performing better than he had anticipated. Pt continues to demonstrate deficits in activity tolerance and WOB and will benefit from continued acute PT to improve endurance and tolerance to activity.     Follow Up Recommendations  Outpatient PT     Equipment Recommendations  None recommended by PT    Recommendations for Other Services       Precautions / Restrictions Precautions Precautions: Fall Precaution Comments: O2 sats Restrictions Weight Bearing Restrictions: No    Mobility  Bed Mobility Overal bed mobility: Modified Independent             General bed mobility comments: HOB elevated    Transfers Overall transfer level: Independent Equipment used: None Transfers: Sit to/from Stand Sit to Stand: Independent             Ambulation/Gait Ambulation/Gait assistance: Min guard Gait Distance (Feet): 800 Feet Assistive device: IV Pole;None Gait Pattern/deviations: Step-through pattern;Decreased stride length Gait velocity: decreased Gait velocity interpretation: >2.62 ft/sec, indicative of community ambulatory General Gait Details: Pt tolerates gait with and without use of IV pole. Pt accepts dynamic gait challenges including scanning and stepping over/around obstacles. Pt requires 3 L [place] during mobility to maintain oxygen sat levels between 89-91%.   Stairs Stairs: Yes Stairs assistance: Min guard Stair Management: No rails;Step to pattern;Forwards Number of Stairs: 3 General stair comments: Pt reports having no difficulty performing steps, stating that his performance was better than anticipated.   Wheelchair Mobility    Modified Rankin (Stroke Patients Only)       Balance Overall balance assessment: Needs assistance Sitting-balance support: Feet supported Sitting balance-Leahy Scale: Good     Standing balance support: During functional activity Standing balance-Leahy Scale: Fair Standing balance comment: Pt tolerates static standing and dynamic standing without reliance of UE support.                            Cognition Arousal/Alertness: Awake/alert Behavior During Therapy: WFL for tasks assessed/performed;Impulsive Overall Cognitive Status: Within Functional Limits for tasks assessed                                        Exercises      General Comments General comments (skin integrity, edema, etc.): Pt on 3 L [place] on arrival with oxygen  sat levels at 94%. Pt weaned to RA and satting at 88% at rest. Pt drops to 86% with mobility. Pt requires 3 L [place] during mobility to maintain oxygen sat levels between 89-91%.      Pertinent Vitals/Pain Pain Assessment: No/denies pain    Home Living                      Prior Function            PT  Goals (current goals can now be found in the care plan section) Acute Rehab PT Goals Patient Stated Goal: Get better and go home. Progress towards PT goals: Progressing toward goals    Frequency    Min 3X/week      PT Plan Current plan remains appropriate    Co-evaluation              AM-PAC PT "6 Clicks" Mobility   Outcome Measure  Help needed turning from your back to your side while in a flat bed without using bedrails?: None Help needed moving from lying on your back to sitting on the side of a flat bed without using bedrails?: None Help needed moving to and from a bed to a chair (including a wheelchair)?: None Help needed standing up from a chair using your arms (e.g., wheelchair or bedside chair)?: None Help needed to walk in hospital room?: A Little Help needed climbing 3-5 steps with a railing? : A Little 6 Click Score: 22    End of Session Equipment Utilized During Treatment: Gait belt;Oxygen Activity Tolerance: Patient tolerated treatment well Patient left: in bed;with call bell/phone within reach Nurse Communication: Mobility status PT Visit Diagnosis: Other abnormalities of gait and mobility (R26.89);Unsteadiness on feet (R26.81);Difficulty in walking, not elsewhere classified (R26.2)     Time: 1093-2355 PT Time Calculation (min) (ACUTE ONLY): 24 min  Charges:  $Gait Training: 8-22 mins $Therapeutic Activity: 8-22 mins                     Acute Rehab  Pager: [phone]    Garwin Brothers, SPT  10/07/2020, 1:43 PM

## 2020-10-08 ENCOUNTER — Telehealth: Payer: Self-pay

## 2020-10-08 ENCOUNTER — Ambulatory Visit (HOSPITAL_COMMUNITY): Payer: Medicaid Other | Admitting: Nurse Practitioner

## 2020-10-08 NOTE — Telephone Encounter (Signed)
Transition Care Management Unsuccessful Follow-up Telephone Call  Date of discharge and from where:  10/07/2020, [facility]  Attempts:  1st Attempt  Reason for unsuccessful TCM follow-up call:  Left voice message on # [phone].   Patient has follow up appointment at [facility] 10/15/2020.

## 2020-10-08 NOTE — Telephone Encounter (Signed)
error 

## 2020-10-09 ENCOUNTER — Telehealth: Payer: Self-pay

## 2020-10-09 ENCOUNTER — Other Ambulatory Visit: Payer: Self-pay

## 2020-10-09 ENCOUNTER — Ambulatory Visit (INDEPENDENT_AMBULATORY_CARE_PROVIDER_SITE_OTHER): Payer: Medicaid Other | Admitting: Emergency Medicine

## 2020-10-09 DIAGNOSIS — I495 Sick sinus syndrome: Secondary | ICD-10-CM | POA: Diagnosis not present

## 2020-10-09 DIAGNOSIS — Z95810 Presence of automatic (implantable) cardiac defibrillator: Secondary | ICD-10-CM

## 2020-10-09 LAB — CUP PACEART INCLINIC DEVICE CHECK
Battery Remaining Longevity: 134 mo
Battery Voltage: 3.04 V
Brady Statistic RV Percent Paced: 0.16 %
Date Time Interrogation Session: 20220610151400
HighPow Impedance: 50 Ohm
HighPow Impedance: 69 Ohm
Implantable Lead Implant Date: 20120806
Implantable Lead Location: 753860
Implantable Lead Model: 185
Implantable Lead Serial Number: 345870
Implantable Pulse Generator Implant Date: 20210917
Lead Channel Impedance Value: 342 Ohm
Lead Channel Impedance Value: 380 Ohm
Lead Channel Pacing Threshold Amplitude: 1.25 V
Lead Channel Pacing Threshold Pulse Width: 0.4 ms
Lead Channel Sensing Intrinsic Amplitude: 10.75 mV
Lead Channel Setting Pacing Amplitude: 3.5 V
Lead Channel Setting Pacing Pulse Width: 0.4 ms
Lead Channel Setting Sensing Sensitivity: 0.3 mV

## 2020-10-09 NOTE — Progress Notes (Signed)
ICD check in clinic due to audible alarm R/T inability to connect during [facility] interrogation in hospital. Normal device function. Thresholds and sensing consistent with previous device measurements. Impedance trends stable over time.  No ventricular arrhythmias. Histogram distribution appropriate for patient and level of activity. No changes made this session. Device programmed at appropriate safety margins. Device programmed to optimize intrinsic conduction. Estimated longevity 11 years , 2 months.. Pt is not enrolled in remote follow-up, home monitor ordered.. Patient education completed including shock plan. Auditory/vibratory alert demonstrated. In clinic check scheduled for 01/14/21.

## 2020-10-09 NOTE — Telephone Encounter (Signed)
Transition Care Management Unsuccessful Follow-up Telephone Call   Date of discharge and from where:  10/07/2020, [facility]   Attempts:  2 nd Attempt   Reason for unsuccessful TCM follow-up call:  Left voice message on # [phone].     Patient has follow up appointment at [facility] 10/15/2020.

## 2020-10-09 NOTE — Telephone Encounter (Signed)
The patient states he heard a sound alarm coming from his ICD. The patient does not have a remote monitor at home. I ordered the patient a monitor. He should receive it in 7-10 business days. I spoke with the nurse and she agreed to see him today at 3 pm.

## 2020-10-09 NOTE — Patient Instructions (Signed)
Call the office when you receive your home monitor to assist with setting up the monitor.  Device clinic: [phone]

## 2020-10-12 ENCOUNTER — Telehealth: Payer: Self-pay

## 2020-10-12 NOTE — Telephone Encounter (Signed)
LVM for pt to call me back to schedule sleep study  

## 2020-10-12 NOTE — Telephone Encounter (Signed)
Transition Care Management Unsuccessful Follow-up Telephone Call   Date of discharge and from where:  10/07/2020, [facility]  How have you been since you were released from the hospital?feeling fine  Any questions or concerns? No questions/concerns reported. Items Reviewed: Did the pt receive and understand the discharge instructions provided? Pt stated his and  his girlfriend have the instructions and have no questions.  Medications obtained and verified? He said that they have the medication list . He said that he has all of the medications and they have no questions.  Any new allergies since your discharge? None reported  Do you have support at home? Yes, girlfriend Other (ie: DME, Home Health, etc)   HHS  Has O2 tank and supply at home from [place].   Functional Questionnaire: (I = Independent and D = Dependent) ADL's:  Independent.        Follow up appointments reviewed:   PCP Hospital f/u appt confirmed? [facility] on 10/15/2020 Specialist Hospital f/u appt confirmed? scheduled at this time  Are transportation arrangements needed? have transportation   If their condition worsens, is the pt aware to call  their PCP or go to the ED? Yes.Made pt aware if condition worsen or start experiencing rapid weight gain, chest pain, diff breathing, SOB, high fevers, or bleading to refer imediately to ED for further evaluation.  Was the patient provided with contact information for the PCP's office or ED? He has the phone number  Was the pt encouraged to call back with questions or concerns?yes

## 2020-10-13 NOTE — Telephone Encounter (Signed)
Patient was returning [facility] phone call to schedule a sleep study. I advised you were unavailable and that you could give him a call back. He expressed appreciation and advised to leave a vm if he was unable to answer your call.

## 2020-10-14 ENCOUNTER — Other Ambulatory Visit: Payer: Self-pay

## 2020-10-14 ENCOUNTER — Encounter (HOSPITAL_COMMUNITY): Payer: Self-pay | Admitting: Physician Assistant

## 2020-10-14 ENCOUNTER — Telehealth: Payer: Self-pay | Admitting: *Deleted

## 2020-10-14 ENCOUNTER — Ambulatory Visit (HOSPITAL_COMMUNITY)
Admission: RE | Admit: 2020-10-14 | Discharge: 2020-10-14 | Disposition: A | Discharge status: Home / Self Care | Payer: Medicaid Other | Source: Ambulatory Visit | Attending: Nurse Practitioner | Admitting: Nurse Practitioner

## 2020-10-14 VITALS — BP 116/70 | HR 52 | Ht 75.0 in | Wt 266.0 lb

## 2020-10-14 DIAGNOSIS — Z79899 Other long term (current) drug therapy: Secondary | ICD-10-CM | POA: Insufficient documentation

## 2020-10-14 DIAGNOSIS — I4819 Other persistent atrial fibrillation: Secondary | ICD-10-CM | POA: Diagnosis not present

## 2020-10-14 DIAGNOSIS — Z6833 Body mass index (BMI) 33.0-33.9, adult: Secondary | ICD-10-CM | POA: Diagnosis not present

## 2020-10-14 DIAGNOSIS — E669 Obesity, unspecified: Secondary | ICD-10-CM | POA: Diagnosis not present

## 2020-10-14 DIAGNOSIS — Z95810 Presence of automatic (implantable) cardiac defibrillator: Secondary | ICD-10-CM | POA: Diagnosis not present

## 2020-10-14 DIAGNOSIS — F419 Anxiety disorder, unspecified: Secondary | ICD-10-CM | POA: Diagnosis not present

## 2020-10-14 DIAGNOSIS — I5022 Chronic systolic (congestive) heart failure: Secondary | ICD-10-CM | POA: Diagnosis not present

## 2020-10-14 DIAGNOSIS — I130 Hypertensive heart and chronic kidney disease with heart failure and stage 1 through stage 4 chronic kidney disease, or unspecified chronic kidney disease: Secondary | ICD-10-CM | POA: Diagnosis not present

## 2020-10-14 DIAGNOSIS — D6869 Other thrombophilia: Secondary | ICD-10-CM | POA: Insufficient documentation

## 2020-10-14 DIAGNOSIS — I2510 Atherosclerotic heart disease of native coronary artery without angina pectoris: Secondary | ICD-10-CM | POA: Insufficient documentation

## 2020-10-14 DIAGNOSIS — R931 Abnormal findings on diagnostic imaging of heart and coronary circulation: Secondary | ICD-10-CM

## 2020-10-14 DIAGNOSIS — Z7901 Long term (current) use of anticoagulants: Secondary | ICD-10-CM | POA: Diagnosis not present

## 2020-10-14 DIAGNOSIS — N184 Chronic kidney disease, stage 4 (severe): Secondary | ICD-10-CM | POA: Diagnosis not present

## 2020-10-14 DIAGNOSIS — Z560 Unemployment, unspecified: Secondary | ICD-10-CM | POA: Diagnosis not present

## 2020-10-14 DIAGNOSIS — Z87891 Personal history of nicotine dependence: Secondary | ICD-10-CM | POA: Diagnosis not present

## 2020-10-14 DIAGNOSIS — G4733 Obstructive sleep apnea (adult) (pediatric): Secondary | ICD-10-CM | POA: Insufficient documentation

## 2020-10-14 MED ORDER — AMIODARONE HCL 200 MG PO TABS: 200.0000 mg | ORAL_TABLET | Freq: Every day | ORAL | 3 refills | Status: DC

## 2020-10-14 NOTE — Patient Instructions (Signed)
Decrease Amiodarone 200mg  once a day

## 2020-10-14 NOTE — Progress Notes (Signed)
Primary Care Physician: Ladell Pier, MD Primary Cardiologist: Dr Burt Knack Primary Electrophysiologist: Dr Lovena Le Referring Physician: Melina Copa   Cody Ortega is a 60 y.o. 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 Havery Moros), anxiety, arthritis, asthma, atrial fibrillation, diverticulosis, HTN, stroke, sleep apnea (intolerant of CPAP, uses nose strips), CKD stage IV (followed by Dr. Candiss Norse), atrial fibrillation who presents for follow up in the [facility]. Patient is on Xarelto for a CHADS2VASC score of 3. He was in afib on 09/15/20 and set up for TEE/DCCV and his amiodarone was increased. He underwent successful DCCV on 10/01/20. He presented to the ED the next day with symptoms of severe SOB. CXR showed bilateral infiltrates concerning for aspiration PNA. He was started on antibiotics.   On follow up today, patient reports that he is feeling much better with resolution of his SOB. He states that he is working on getting scheduled for repeat sleep study for treatment of his sleep apnea.   Today, he denies symptoms of palpitations, chest pain, shortness of breath, orthopnea, PND, lower extremity edema, dizziness, presyncope, syncope, bleeding, or neurologic sequela. The patient is tolerating medications without difficulties and is otherwise without complaint today.    Atrial Fibrillation Risk Factors:  he does have symptoms or diagnosis of sleep apnea. he is not compliant with CPAP therapy. he does not have a history of rheumatic fever.   he has a BMI of Body mass index is 33.25 kg/m.Marland Kitchen Filed Weights   10/14/20 1359  Weight: 120.7 kg    Family History  Problem Relation Age of Onset   Breast cancer Mother    Multiple myeloma Mother    Prostate cancer Father    Diabetes Father     Peripheral vascular disease Father    Cerebral palsy Daughter    Lung disease Neg Hx    Colon cancer Neg Hx    Stomach cancer Neg Hx    Esophageal cancer Neg Hx    Colon polyps Neg Hx      Atrial Fibrillation Management history:  Previous antiarrhythmic drugs: amiodarone Previous cardioversions: 2015 x 2, 10/01/20 Previous ablations: none CHADS2VASC score: 3 Anticoagulation history: Xarelto   Past Medical History:  Diagnosis Date   Anxiety    Arthritis    Benzodiazepine dependence (HCC)    Bronchial asthma    CAD (coronary artery disease)    a. Cath 03/2010: mod RCA stenosis, severe diag stenosis, treated medically given lack of angina.   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 Episode in 2008 with spontaneous conversion, on amiodarone per EP.   S/P implantation of automatic cardioverter/defibrillator (AICD)    a. Medtronic, implanted 2012.   Sleep apnea    Stroke [facility]) 2004   Thyroid disease    Past Surgical History:  Procedure Laterality Date   CARDIOVERSION N/A 01/07/2014   Procedure: CARDIOVERSION;  Surgeon: Lelon Perla, MD;  Location: [facility] ENDOSCOPY;  Service: Cardiovascular;  Laterality: N/A;   CARDIOVERSION N/A 02/11/2014   Procedure: CARDIOVERSION;  Surgeon: Pixie Casino, MD;  Location: [place];  Service: Cardiovascular;  Laterality: N/A;   CARDIOVERSION N/A 10/01/2020   Procedure: CARDIOVERSION;  Surgeon: Werner Lean, MD;  Location: [place] ENDOSCOPY;  Service: Cardiovascular;  Laterality: N/A;   COLONOSCOPY WITH  PROPOFOL N/A 02/06/2018   Procedure: COLONOSCOPY WITH PROPOFOL;  Surgeon: Yetta Flock, MD;  Location: [facility] ENDOSCOPY;  Service: Gastroenterology;  Laterality: N/A;   ICD GENERATOR CHANGEOUT N/A 01/17/2020   Procedure: ICD GENERATOR CHANGEOUT;  Surgeon: Evans Lance, MD;  Location: [place] CV LAB;  Service: Cardiovascular;  Laterality: N/A;   LASEK eye surgery Bilateral 05/2018   LASIK Bilateral    PACEMAKER INSERTION     POLYPECTOMY  02/06/2018   Procedure: POLYPECTOMY;  Surgeon: Yetta Flock, MD;  Location: [facility] ENDOSCOPY;  Service: Gastroenterology;;   TEE WITHOUT CARDIOVERSION N/A 01/07/2014   Procedure: TRANSESOPHAGEAL ECHOCARDIOGRAM (TEE);  Surgeon: Lelon Perla, MD;  Location: [facility] ENDOSCOPY;  Service: Cardiovascular;  Laterality: N/A;   TEE WITHOUT CARDIOVERSION N/A 02/11/2014   Procedure: TRANSESOPHAGEAL ECHOCARDIOGRAM (TEE);  Surgeon: Pixie Casino, MD;  Location: [facility] ENDOSCOPY;  Service: Cardiovascular;  Laterality: N/A;   TEE WITHOUT CARDIOVERSION N/A 10/01/2020   Procedure: TRANSESOPHAGEAL ECHOCARDIOGRAM (TEE);  Surgeon: Werner Lean, MD;  Location: [facility] ENDOSCOPY;  Service: Cardiovascular;  Laterality: N/A;   TONSILLECTOMY     TRANSTHORACIC ECHOCARDIOGRAM  10/2006, 12/2009    Current Outpatient Medications  Medication Sig Dispense Refill   acetaminophen (TYLENOL) 500 MG tablet Take 1,000 mg by mouth every 6 (six) hours as needed for moderate pain or headache.     albuterol (VENTOLIN HFA) 108 (90 Base) MCG/ACT inhaler INHALE 2 PUFFS INTO THE LUNGS EVERY 6 HOURS AS NEEDED FOR WHEEZING OR SHORTNESS OF BREATH 8.5 g 6   alprazolam (XANAX) 2 MG tablet Take 2 mg by mouth every 6 (six) hours as needed for anxiety.     amLODipine (NORVASC) 10 MG tablet TAKE 1 TABLET BY MOUTH EVERY DAY 90 tablet 2   carvedilol (COREG) 12.5 MG tablet TAKE 1 TABLET(12.5 MG) BY MOUTH TWICE DAILY WITH A MEAL 180 tablet 3   dapagliflozin propanediol (FARXIGA) 10 MG TABS tablet Take 1  tablet (10 mg total) by mouth daily before breakfast. 30 tablet 3   Dextran 70-Hypromellose, PF, ([employer] NATURAL TEARS) 0.1-0.3 % SOLN Place 1 drop into both eyes daily as needed (dry eyes).     diphenhydrAMINE (BENADRYL) 25 MG tablet Take 50 mg by mouth every 6 (six) hours as needed for allergies.     DULoxetine (CYMBALTA) 30 MG capsule Take 90 mg by mouth daily.      entecavir (BARACLUDE) 0.5 MG tablet Take 1 tablet (0.5 mg total) by mouth every other day. Please note: Every OTHER day 45 tablet 1   ezetimibe (ZETIA) 10 MG tablet TAKE 1 TABLET(10 MG) BY MOUTH DAILY 90 tablet 2   ferrous sulfate 325 (65 FE) MG tablet Take 325 mg by mouth daily with breakfast.     fluticasone (FLONASE) 50 MCG/ACT nasal spray SHAKE LIQUID AND USE 1 SPRAY IN EACH NOSTRIL DAILY 16 g 2   furosemide (LASIX) 40 MG tablet One tablet by mouth ( 40 mg) daily. 90 tablet 3   hydrALAZINE (APRESOLINE) 50 MG tablet TAKE 1  TABLET(50 MG) BY MOUTH TWICE DAILY 180 tablet 3   ondansetron (ZOFRAN-ODT) 8 MG disintegrating tablet Take 8 mg by mouth 2 (two) times daily as needed for nausea or vomiting.     oxybutynin (DITROPAN) 5 MG tablet Take 5 mg by mouth 3 (three) times daily.     polyethylene glycol (MIRALAX) 17 g packet Take as directed, Daily, titrate as needed 14 each 0   sildenafil (REVATIO) 20 MG tablet Take 2-5 tablets by mouth once daily as needed prior to sexual activity. 100 tablet 3   SYMBICORT 160-4.5 MCG/ACT inhaler Inhale 2 puffs into the lungs 2 (two) times daily. 10.2 g 2   tamsulosin (FLOMAX) 0.4 MG CAPS capsule Take 0.4 mg by mouth 2 (two) times daily.     XARELTO 20 MG TABS tablet TAKE 1 TABLET(20 MG) BY MOUTH DAILY WITH SUPPER 90 tablet 1   amiodarone (PACERONE) 200 MG tablet Take 1 tablet (200 mg total) by mouth daily. 180 tablet 3   No current facility-administered medications for this encounter.    Allergies  Allergen Reactions   Prochlorperazine Other (See Comments)    Medication caused a lot of issues like  blurred vision, nausea, pain, and carpool turnel   Codeine Itching, Nausea And Vomiting and Hives   Hydrocodone-Acetaminophen Itching and Nausea And Vomiting    Social History   Socioeconomic History   Marital status: Divorced    Spouse name: Not on file   Number of children: 2   Years of education: Not on file   Highest education level: Not on file  Occupational History   Occupation: Unemployed    Employer: UNEMPLOYED    Comment: basketball referee in the past  Tobacco Use   Smoking status: Former    Packs/day: 0.50    Years: 0.50    Pack years: 0.25    Types: Cigarettes    Quit date: 05/02/2010    Years since quitting: 10.4   Smokeless tobacco: Never   Tobacco comments:    Significant Second-hand and only 3 months himself  Vaping Use   Vaping Use: Never used  Substance and Sexual Activity   Alcohol use: Not Currently    Alcohol/week: 0.0 standard drinks    Comment: rare   Drug use: No    Comment: Reported history of cocaine abuse off and on    Sexual activity: Not on file  Other Topics Concern   Not on file  Social History Narrative   Living with a son who is a 29 year old.  He is not     working, unemployed for 3 years.  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  Denied use of alcohol.  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       [facility]:   Originally from [place], [place]. He grew up in [place]. He moved to [place] in 1985. 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 No pets currently. No bird exposure. He did have mold in a previous home. No hot tub exposure.         Social Determinants of Health   Financial Resource Strain: Not on file  Food Insecurity: Not on file  Transportation Needs: Not on file  Physical Activity: Not on file  Stress: Not on file  Social Connections: Not on file  Intimate Partner Violence: Not on file     ROS- All systems are reviewed and  negative  except as per the HPI above.  Physical Exam: Vitals:   10/14/20 1359  BP: 116/70  Pulse: (!) 52  Weight: 120.7 kg  Height: $Remove'6\' 3"'hGImMsH$  (1.905 m)    GEN- The patient is a well appearing obese male, alert and oriented x 3 today.   Head- normocephalic, atraumatic Eyes-  Sclera clear, conjunctiva pink Ears- hearing intact Oropharynx- clear Neck- supple  Lungs- Clear to ausculation bilaterally, normal work of breathing Heart- Regular rate and rhythm, bradycardia, no murmurs, rubs or gallops  GI- soft, NT, ND, + BS Extremities- no clubbing, cyanosis, or edema MS- no significant deformity or atrophy Skin- no rash or lesion Psych- euthymic mood, full affect Neuro- strength and sensation are intact  Wt Readings from Last 3 Encounters:  10/14/20 120.7 kg  10/07/20 118.2 kg  10/01/20 126.1 kg    EKG today demonstrates  SB, inc LBBB, chronic NST NO STEMI Vent. rate 52 BPM PR interval 148 ms QRS duration 116 ms QT/QTcB 506/470 ms  Echo 05/29/20 demonstrated   1. Left ventricular ejection fraction, by estimation, is 25 to 30%. The  left ventricle has severely decreased function. The left ventricle  demonstrates regional wall motion abnormalities (see scoring  diagram/findings for description). There is moderate  left ventricular hypertrophy. Left ventricular diastolic parameters are  consistent with Grade II diastolic dysfunction (pseudonormalization).   2. Right ventricular systolic function is normal. The right ventricular  size is normal. There is normal pulmonary artery systolic pressure.   3. Left atrial size was severely dilated.   4. Right atrial size was mildly dilated.   5. The mitral valve is normal in structure. No evidence of mitral valve  regurgitation. No evidence of mitral stenosis.   6. The aortic valve is normal in structure. Aortic valve regurgitation is  not visualized. No aortic stenosis is present.   7. The inferior vena cava is normal in size with greater than  50%  respiratory variability, suggesting right atrial pressure of 3 mmHg.   Epic records are reviewed at length today  CHA2DS2-VASc Score = 5  The patient's score is based upon: CHF History: Yes HTN History: Yes Diabetes History: No Stroke History: Yes Vascular Disease History: Yes Age Score: 0 Gender Score: 0     ASSESSMENT AND PLAN: 1. Persistent Atrial Fibrillation (ICD10:  I48.19) The patient's CHA2DS2-VASc score is 5, indicating a 7.2% annual risk of stroke.   Patient appears to be maintaining SR. Decrease amiodarone to 200 mg daily Recent lab work reviewed.  Continue Xarelto 20 mg daily Continue carvedilol 12.5 mg BID  2. Secondary Hypercoagulable State (ICD10:  D68.69) The patient is at significant risk for stroke/thromboembolism based upon his CHA2DS2-VASc Score of 5.  Continue Rivaroxaban (Xarelto).   3. Obesity Body mass index is 33.25 kg/m. Lifestyle modification was discussed at length including regular exercise and weight reduction.  4. Obstructive sleep apnea The importance of adequate treatment of sleep apnea was discussed today in order to improve our ability to maintain sinus rhythm long term. Patient having repeat sleep study per his report.  5. Chronic systolic CHF No signs or symptoms of fluid overload.  6. CAD No anginal symptoms.  7. HTN Stable, no changes today.   Follow up with Dr Burt Knack as scheduled.    [facility] [address] [place], [place] [address] [phone] 10/14/2020 2:20 PM

## 2020-10-14 NOTE — Telephone Encounter (Signed)
-----   Message from Charlie Pitter, [place] sent at 10/07/2020  8:04 AM EDT ----- Please call patient. Reviewed his TEE with Drs. Burt Knack and [employer]. There was mention of an abnormal density on his device lead but this was not seen in any other views. Per reply from Dr. Lovena Le, "We do not work up thrombus on the leads unless the patient's have other symptoms of SBE or multiple SBE risks like IVDA. "  Please confirm that patient does not use IV drugs or have any symptoms of fevers, chills, night sweats, new nodules on hands/feet or new abnormal discolored lines on his fingernails and toenails. Would go ahead and have him get blood cultures drawn as a precaution but if these are negative, nothing else to do.   Keep f/u with afib clinic tomorrow as scheduled and f/u with Dr. Burt Knack in July.

## 2020-10-14 NOTE — Progress Notes (Signed)
Patient ID: Cody Ortega, male   DOB: [birth year], 60 y.o.   MRN: [id number]    Cody Ortega, is a 60 y.o. male  [id number]  [id number]:[id number]  DOB - 11-Sep-1960  Subjective:  Chief Complaint and HPI: Cody Ortega is a 60 y.o. male here today to  After hospitalization 6/3-10/07/2020 with acute resp failure and pneumonia.  He is doing much better.  No fevers since discharge.  Appetite is at baseline.  He has RF on meds.  He does need albuterol nebules for O2/nebs.  He saw cardiology yesterday.  Has to have blood cultures drawn.  Only using O2 at night or after activity.    Has R elbow swelling after slamming R elbow into the wall many weeks ago.  Not painful and ROM not affected.    Discharge Diagnoses:  Active Problems:   Acute respiratory failure with hypoxia (HCC)   Aspiration pneumonia of both lower lobes due to gastric secretions (HCC)   Aspiration pneumonia ([place]  History of present illness:  60 year old male with medical history of asthma, COPD not on oxygen, chronic otitis B, paroxysmal atrial fibrillation on Xarelto with recent TEE cardioversion on 10/01/2020, ischemic cardiomyopathy s/p AICD placement, EF of 30% came to urgent care for nonproductive cough for 1 day duration.   Hospital Course:    Acute respiratory failure with hypoxemia -Resolved -Secondary to aspiration pneumonia, acute decompensated systolic heart failure -Completed 5 days of antibiotic treatment in the hospital -He will need home oxygen 3 L/min   Mild pulmonary edema -Significantly improved with diuresis -Continue Lasix 40 mg daily at home   HFrEF -EF 30 to 35%, left ventricle demonstrate global hypokinesis -Mild mitral valve regurgitation -BNP 908 on 10/03/2020 -Continue home medications including Lasix 40 mg daily     CKD stage IV -Creatinine 2.17, at baseline  Seen by cardiology yesterday:  From A/P: ASSESSMENT AND PLAN: 1. Persistent Atrial Fibrillation (ICD10:  I48.19) The patient's  CHA2DS2-VASc score is 5, indicating a 7.2% annual risk of stroke.   Patient appears to be maintaining SR. Decrease amiodarone to 200 mg daily Recent lab work reviewed. Continue Xarelto 20 mg daily Continue carvedilol 12.5 mg BID   2. Secondary Hypercoagulable State (ICD10:  D68.69) The patient is at significant risk for stroke/thromboembolism based upon his CHA2DS2-VASc Score of 5.  Continue Rivaroxaban (Xarelto).    3. Obesity Body mass index is 33.25 kg/m. Lifestyle modification was discussed at length including regular exercise and weight reduction.   4. Obstructive sleep apnea The importance of adequate treatment of sleep apnea was discussed today in order to improve our ability to maintain sinus rhythm long term. Patient having repeat sleep study per his report.   5. Chronic systolic CHF No signs or symptoms of fluid overload.   6. CAD No anginal symptoms.   7. HTN Stable, no changes today.     ROS:   Constitutional:  No f/c, No night sweats, No unexplained weight loss. EENT:  No vision changes, No blurry vision, No hearing changes. No mouth, throat, or ear problems.  Respiratory: No cough, improving SOB Cardiac: No CP, no palpitations GI:  No abd pain, No N/V/D. GU: No Urinary s/sx Musculoskeletal: R elbow swelling Neuro: No headache, no dizziness, no motor weakness.  Skin: No rash Endocrine:  No polydipsia. No polyuria.  Psych: Denies SI/HI  No problems updated.  ALLERGIES: Allergies  Allergen Reactions   Prochlorperazine Other (See Comments)    Medication caused a lot of issues like blurred vision,  nausea, pain, and carpool turnel   Codeine Itching, Nausea And Vomiting and Hives   Hydrocodone-Acetaminophen Itching and Nausea And Vomiting    PAST MEDICAL HISTORY: Past Medical History:  Diagnosis Date   Anxiety    Arthritis    Benzodiazepine dependence (HCC)    Bronchial asthma    CAD (coronary artery disease)    a. Cath 03/2010: mod RCA stenosis,  severe diag stenosis, treated medically given lack of angina.   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 Episode in 2008 with spontaneous conversion, on amiodarone per EP.   S/P implantation of automatic cardioverter/defibrillator (AICD)    a. Medtronic, implanted 2012.   Sleep apnea    Stroke [facility]) 2004   Thyroid disease     MEDICATIONS AT HOME: Prior to Admission medications   Medication Sig Start Date End Date Taking? Authorizing Provider  acetaminophen (TYLENOL) 500 MG tablet Take 1,000 mg by mouth every 6 (six) hours as needed for moderate pain or headache.   Yes [provider]  albuterol (PROVENTIL) (2.5 MG/3ML) 0.083% nebulizer solution Take 3 mLs (2.5 mg total) by nebulization every 6 (six) hours as needed for wheezing or shortness of breath. 10/15/20  Yes Noraa Pickeral, Dionne Bucy, PA-C  albuterol (VENTOLIN HFA) 108 (90 Base) MCG/ACT inhaler INHALE 2 PUFFS INTO THE LUNGS EVERY 6 HOURS AS NEEDED FOR WHEEZING OR SHORTNESS OF BREATH 09/05/19  Yes Ladell Pier, MD  alprazolam Duanne Moron) 2 MG tablet Take 2 mg by mouth every 6 (six) hours as needed for anxiety.   Yes [provider]  amiodarone (PACERONE) 200 MG tablet Take 1 tablet (200 mg total) by mouth daily. 10/14/20  Yes Fenton, Clint R, PA  amLODipine (NORVASC) 10 MG tablet TAKE 1 TABLET BY MOUTH EVERY DAY 04/30/20  Yes [name], MD  carvedilol (COREG) 12.5 MG tablet TAKE 1 TABLET(12.5 MG) BY MOUTH TWICE DAILY WITH A MEAL 04/09/20  Yes [name], MD  dapagliflozin propanediol  (FARXIGA) 10 MG TABS tablet Take 1 tablet (10 mg total) by mouth daily before breakfast. 06/23/20  Yes Pemberton, Greer Ee, MD  Dextran 70-Hypromellose, PF, ([employer] NATURAL TEARS) 0.1-0.3 % SOLN Place 1 drop into both eyes daily as needed (dry eyes).   Yes [provider]  DULoxetine (CYMBALTA) 30 MG capsule Take 90 mg by mouth daily.    Yes [provider]  entecavir (BARACLUDE) 0.5 MG tablet Take 1 tablet (0.5 mg total) by mouth every other day. Please note: Every OTHER day 10/05/20  Yes Armbruster, Carlota Raspberry, MD  ezetimibe (ZETIA) 10 MG tablet TAKE 1 TABLET(10 MG) BY MOUTH DAILY 04/30/20  Yes [name], MD  ferrous sulfate 325 (65 FE) MG tablet Take 325 mg by mouth daily with breakfast.   Yes [provider]  fluticasone (FLONASE) 50 MCG/ACT nasal spray SHAKE LIQUID AND USE 1 SPRAY IN EACH NOSTRIL DAILY 03/19/20  Yes Ladell Pier, MD  furosemide (LASIX) 40 MG  tablet One tablet by mouth ( 40 mg) daily. 09/15/20  Yes Dunn, Dayna N, PA-C  hydrALAZINE (APRESOLINE) 50 MG tablet TAKE 1 TABLET(50 MG) BY MOUTH TWICE DAILY 05/27/20  Yes [name], MD  ondansetron (ZOFRAN-ODT) 8 MG disintegrating tablet Take 8 mg by mouth 2 (two) times daily as needed for nausea or vomiting. 12/11/19  Yes [provider]  oxybutynin (DITROPAN) 5 MG tablet Take 5 mg by mouth 3 (three) times daily.   Yes [provider]  polyethylene glycol (MIRALAX) 17 g packet Take as directed, Daily, titrate as needed 02/21/20  Yes Armbruster, Carlota Raspberry, MD  sildenafil (REVATIO) 20 MG tablet Take 2-5 tablets by mouth once daily as needed prior to sexual activity. 05/07/20  Yes Dunn, Dayna N, PA-C  SYMBICORT 160-4.5 MCG/ACT inhaler Inhale 2 puffs into the lungs 2 (two) times daily. 10/05/20  Yes Charlynne Cousins, MD  tamsulosin (FLOMAX) 0.4 MG CAPS capsule Take 0.4 mg by mouth 2 (two) times daily.   Yes [provider]  XARELTO 20 MG TABS tablet TAKE 1 TABLET(20 MG) BY MOUTH DAILY  WITH SUPPER 09/07/20  Yes [name], MD  diphenhydrAMINE (BENADRYL) 25 MG tablet Take 50 mg by mouth every 6 (six) hours as needed for allergies. Patient not taking: Reported on 10/15/2020    [provider]     Objective:  EXAM:   Vitals:   10/15/20 1037  BP: 136/67  Pulse: 67  SpO2: 94%  Weight: 269 lb 3.2 oz (122.1 kg)  Height: 6\' 3"  (1.905 m)    General appearance : A&OX3. NAD. Non-toxic-appearing pulse ox on RA HEENT: Atraumatic and Normocephalic.  PERRLA. EOM intact.  T Chest/Lungs:  Breathing-non-labored, Good air entry bilaterally, breath sounds normal without rales, rhonchi, or wheezing  CVS: S1 S2 regular, no murmurs, gallops, rubs  R elbow/arm-full S&ROM with fluctuant and non tender swelling on R elbow.  Extremities: Bilateral Lower Ext shows no edema, both legs are warm to touch with = pulse throughout Neurology:  CN II-XII grossly intact, Non focal.   Psych:  TP linear. J/I fair. Normal speech. Appropriate eye contact and affect.  Skin:  No Rash  Data Review Lab Results  Component Value Date   HGBA1C 5.0 07/09/2018   HGBA1C 5 5 07/09/2015     Assessment & Plan   1. Essential hypertension Controlled-no changes - CBC with Differential/Platelet - Comprehensive metabolic panel  2. Chronic systolic heart failure (HCC) Stable-continue current regimen  3. Paroxysmal atrial fibrillation (HCC) Amiodarone 200mg -continue  4. Aspiration pneumonia of both lower lobes due to gastric secretions (HCC) - albuterol (PROVENTIL) (2.5 MG/3ML) 0.083% nebulizer solution; Take 3 mLs (2.5 mg total) by nebulization every 6 (six) hours as needed for wheezing or shortness of breath.  Dispense: 150 mL; Refill: 1  5. Acute respiratory failure with hypoxia (HCC) - albuterol (PROVENTIL) (2.5 MG/3ML) 0.083% nebulizer solution; Take 3 mLs (2.5 mg total) by nebulization every 6 (six) hours as needed for wheezing or shortness of breath.  Dispense: 150 mL; Refill: 1  6.  Hospital discharge follow-up  7. Bursitis of other bursa of right elbow - Ambulatory referral to Orthopedic Surgery  8. Elevated brain natriuretic peptide (BNP) level - Brain natriuretic peptide   Patient have been counseled extensively about nutrition and exercise  Return in about 6 weeks (around 11/26/2020) for with PCP for chronic conditions.  The patient was given clear instructions to go to ER or return to medical center if symptoms don't improve, worsen  or new problems develop. The patient verbalized understanding. The patient was told to call to get lab results if they haven't heard anything in the next week.     Freeman Caldron, PA-C [facility] [place], [place]   10/15/2020, 11:27 AM

## 2020-10-15 ENCOUNTER — Encounter: Payer: Self-pay | Admitting: Physician Assistant

## 2020-10-15 ENCOUNTER — Ambulatory Visit: Payer: Medicaid Other | Attending: Physician Assistant | Admitting: Physician Assistant

## 2020-10-15 VITALS — BP 136/67 | HR 67 | Ht 75.0 in | Wt 269.2 lb

## 2020-10-15 DIAGNOSIS — M7031 Other bursitis of elbow, right elbow: Secondary | ICD-10-CM | POA: Diagnosis not present

## 2020-10-15 DIAGNOSIS — Z7901 Long term (current) use of anticoagulants: Secondary | ICD-10-CM | POA: Insufficient documentation

## 2020-10-15 DIAGNOSIS — Z7951 Long term (current) use of inhaled steroids: Secondary | ICD-10-CM | POA: Diagnosis not present

## 2020-10-15 DIAGNOSIS — J9601 Acute respiratory failure with hypoxia: Secondary | ICD-10-CM | POA: Diagnosis not present

## 2020-10-15 DIAGNOSIS — I5022 Chronic systolic (congestive) heart failure: Secondary | ICD-10-CM | POA: Diagnosis not present

## 2020-10-15 DIAGNOSIS — N184 Chronic kidney disease, stage 4 (severe): Secondary | ICD-10-CM | POA: Insufficient documentation

## 2020-10-15 DIAGNOSIS — I2510 Atherosclerotic heart disease of native coronary artery without angina pectoris: Secondary | ICD-10-CM | POA: Diagnosis not present

## 2020-10-15 DIAGNOSIS — Z885 Allergy status to narcotic agent status: Secondary | ICD-10-CM | POA: Diagnosis not present

## 2020-10-15 DIAGNOSIS — Z95810 Presence of automatic (implantable) cardiac defibrillator: Secondary | ICD-10-CM | POA: Insufficient documentation

## 2020-10-15 DIAGNOSIS — I255 Ischemic cardiomyopathy: Secondary | ICD-10-CM | POA: Insufficient documentation

## 2020-10-15 DIAGNOSIS — E669 Obesity, unspecified: Secondary | ICD-10-CM | POA: Diagnosis not present

## 2020-10-15 DIAGNOSIS — J449 Chronic obstructive pulmonary disease, unspecified: Secondary | ICD-10-CM | POA: Insufficient documentation

## 2020-10-15 DIAGNOSIS — Z79899 Other long term (current) drug therapy: Secondary | ICD-10-CM | POA: Diagnosis not present

## 2020-10-15 DIAGNOSIS — J690 Pneumonitis due to inhalation of food and vomit: Secondary | ICD-10-CM | POA: Diagnosis not present

## 2020-10-15 DIAGNOSIS — I480 Paroxysmal atrial fibrillation: Secondary | ICD-10-CM | POA: Diagnosis not present

## 2020-10-15 DIAGNOSIS — Z6833 Body mass index (BMI) 33.0-33.9, adult: Secondary | ICD-10-CM | POA: Diagnosis not present

## 2020-10-15 DIAGNOSIS — I10 Essential (primary) hypertension: Secondary | ICD-10-CM

## 2020-10-15 DIAGNOSIS — R7989 Other specified abnormal findings of blood chemistry: Secondary | ICD-10-CM | POA: Diagnosis not present

## 2020-10-15 DIAGNOSIS — Z09 Encounter for follow-up examination after completed treatment for conditions other than malignant neoplasm: Secondary | ICD-10-CM | POA: Diagnosis not present

## 2020-10-15 DIAGNOSIS — I4819 Other persistent atrial fibrillation: Secondary | ICD-10-CM | POA: Diagnosis not present

## 2020-10-15 DIAGNOSIS — I130 Hypertensive heart and chronic kidney disease with heart failure and stage 1 through stage 4 chronic kidney disease, or unspecified chronic kidney disease: Secondary | ICD-10-CM | POA: Insufficient documentation

## 2020-10-15 MED ORDER — ALBUTEROL SULFATE (2.5 MG/3ML) 0.083% IN NEBU: 2.5000 mg | INHALATION_SOLUTION | Freq: Four times a day (QID) | RESPIRATORY_TRACT | 1 refills | Status: DC | PRN

## 2020-10-15 NOTE — Progress Notes (Signed)
Knot on right elbow from fall

## 2020-10-16 LAB — CBC WITH DIFFERENTIAL/PLATELET
Basophils Absolute: 0 10*3/uL (ref 0.0–0.2)
Basos: 1 %
EOS (ABSOLUTE): 0.1 10*3/uL (ref 0.0–0.4)
Eos: 2 %
Hematocrit: 48.4 % (ref 37.5–51.0)
Hemoglobin: 16 g/dL (ref 13.0–17.7)
Immature Grans (Abs): 0.1 10*3/uL (ref 0.0–0.1)
Immature Granulocytes: 1 %
Lymphocytes Absolute: 0.8 10*3/uL (ref 0.7–3.1)
Lymphs: 10 %
MCH: 30.5 pg (ref 26.6–33.0)
MCHC: 33.1 g/dL (ref 31.5–35.7)
MCV: 92 fL (ref 79–97)
Monocytes Absolute: 0.6 10*3/uL (ref 0.1–0.9)
Monocytes: 7 %
Neutrophils Absolute: 6.3 10*3/uL (ref 1.4–7.0)
Neutrophils: 79 %
Platelets: 272 10*3/uL (ref 150–450)
RBC: 5.25 x10E6/uL (ref 4.14–5.80)
RDW: 13.6 % (ref 11.6–15.4)
WBC: 7.9 10*3/uL (ref 3.4–10.8)

## 2020-10-16 LAB — COMPREHENSIVE METABOLIC PANEL
ALT: 13 IU/L (ref 0–44)
AST: 11 IU/L (ref 0–40)
Albumin/Globulin Ratio: 1.3 (ref 1.2–2.2)
Albumin: 4 g/dL (ref 3.8–4.9)
Alkaline Phosphatase: 96 IU/L (ref 44–121)
BUN/Creatinine Ratio: 12 (ref 9–20)
BUN: 26 mg/dL — ABNORMAL HIGH (ref 6–24)
Bilirubin Total: 0.3 mg/dL (ref 0.0–1.2)
CO2: 23 mmol/L (ref 20–29)
Calcium: 9.6 mg/dL (ref 8.7–10.2)
Chloride: 99 mmol/L (ref 96–106)
Creatinine, Ser: 2.11 mg/dL — ABNORMAL HIGH (ref 0.76–1.27)
Globulin, Total: 3 g/dL (ref 1.5–4.5)
Glucose: 113 mg/dL — ABNORMAL HIGH (ref 65–99)
Potassium: 3.6 mmol/L (ref 3.5–5.2)
Sodium: 139 mmol/L (ref 134–144)
Total Protein: 7 g/dL (ref 6.0–8.5)
eGFR: 36 mL/min/{1.73_m2} — ABNORMAL LOW (ref >59)

## 2020-10-16 LAB — BRAIN NATRIURETIC PEPTIDE: BNP: 324.5 pg/mL — ABNORMAL HIGH (ref 0.0–100.0)

## 2020-10-20 ENCOUNTER — Ambulatory Visit (INDEPENDENT_AMBULATORY_CARE_PROVIDER_SITE_OTHER): Payer: Medicaid Other | Admitting: Orthopaedic Surgery

## 2020-10-20 ENCOUNTER — Ambulatory Visit (INDEPENDENT_AMBULATORY_CARE_PROVIDER_SITE_OTHER): Payer: Medicaid Other

## 2020-10-20 ENCOUNTER — Encounter: Payer: Self-pay | Admitting: Orthopaedic Surgery

## 2020-10-20 DIAGNOSIS — M7021 Olecranon bursitis, right elbow: Secondary | ICD-10-CM | POA: Diagnosis not present

## 2020-10-20 NOTE — Progress Notes (Signed)
Office Visit Note   Patient: Cody Ortega           Date of Birth: May 17, 1960           MRN: [id number] Visit Date: 10/20/2020              Requested by: Anders Simmonds, PA-C [address] [place],  [place] [address] PCP: Marcine Matar, MD   Assessment & Plan: Visit Diagnoses:  1. Olecranon bursitis, right elbow     Plan: Impression is right elbow olecranon bursitis likely traumatic. We discussed treatment options including no treatment, aspiration and cortisone injection followed by compression. Patient would like to proceed with the aspiration and injection today. Under sterile conditions, 10 ccs of straw colored fluid and 10 ccs of blood tinged fluid was aspirated from the olecranon bursa followed by cortisone injection. Fluid was sent for analysis. Patient tolerated this well. Compression wrap was applied to the right elbow. Follow up as needed if his symptoms return. Total face to face encounter time was greater than 45 minutes and over half of this time was spent in counseling and/or coordination of care.  Follow-Up Instructions: Return if symptoms worsen or fail to improve.   Orders:  Orders Placed This Encounter  Procedures   Medium Joint Inj   Anaerobic and Aerobic Culture   Gram stain   XR Elbow 2 Views Right   Cell count + diff,  w/ cryst-synvl fld   No orders of the defined types were placed in this encounter.    Procedures: Medium Joint Inj: R olecranon bursa on 10/21/2020 11:35 AM Details: 22 G needle Medications: 1 mL lidocaine 1 %; 40 mg methylPREDNISolone acetate 40 MG/ML; 1 mL bupivacaine 0.5 % Outcome: tolerated well, no immediate complications     Clinical Data: No additional findings.   Subjective: Chief Complaint  Patient presents with   Right Elbow - Pain    HPI Cody Ortega is a 60 y.o. male who presents for evaluation of right elbow swelling. His symptoms started several weeks ago after he fell and landed directly on the  point of the right elbow. Since then he's had a large localized swelling over the olecranon bursa. He denies any significant pain with this, and no warmth or erythema of the elbow. He reports similar symptoms after playing highschool football that he treated with ice. He has never had it aspirated. He denies fevers, chills or night sweats however he was hospitalized for 5 days in the beginning of June for an aspiration pneumonia. He had the elbow effusion prior to being hospitalized. No history of gout.   Review of Systems Review of Systems was reviewed and negative unless as stated in the HPI  Objective: Vital Signs: There were no vitals taken for this visit.  Physical Exam  Ortho Exam Right elbow exam demonstrates swelling localized to the olecranon bursa without warmth, erythema or tenderness. Elbow motion is within normal limits.   Specialty Comments:  No specialty comments available.  Imaging: Radiographs of the right elbow dated 10/21/20 were reviewed and normal.   PMFS History: Patient Active Problem List   Diagnosis Date Noted   Secondary hypercoagulable state (HCC) 10/14/2020   Acute respiratory failure with hypoxia (HCC) 10/03/2020   Aspiration pneumonia of both lower lobes due to gastric secretions (HCC) 10/03/2020   Aspiration pneumonia (HCC) 10/03/2020   HFrEF (heart failure with reduced ejection fraction) (HCC)    Stage 3b chronic kidney disease (HCC) 09/05/2019  BPH (benign prostatic hyperplasia) 06/19/2019   Dental abscess 04/30/2018   Adenomatous polyp of colon 02/09/2018   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 agent with hepatic coma 02/09/2018   Osteoarthritis of both knees 06/12/2017   COPD with asthma ([place]) 03/17/2016   Chronic seasonal allergic rhinitis 03/17/2016   GERD (gastroesophageal reflux disease) 03/17/2016   Chronic pain syndrome 03/16/2015   Obstructive sleep apnea 03/16/2015   Chronic low back pain with right-sided sciatica 03/16/2015   Sinus  brady-tachy syndrome ([place]) 02/20/2014   Atrial fibrillation ([place]) 12/31/2013   Automatic implantable cardioverter-defibrillator in situ 03/08/2011   Erectile dysfunction 02/28/2011   CAD, NATIVE VESSEL 04/26/2010   PURE HYPERCHOLESTEROLEMIA 04/12/2010   HLD (hyperlipidemia) 01/11/2010   Essential hypertension, malignant 25/95/6387   Chronic systolic heart failure ([place]) 12/18/2009   Past Medical History:  Diagnosis Date   Anxiety    Arthritis    Benzodiazepine dependence ([place])    Bronchial asthma    CAD (coronary artery disease)    a. Cath 03/2010: mod RCA stenosis, severe diag stenosis, treated medically given lack of angina.   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 Episode in 2008 with spontaneous conversion, on amiodarone per EP.   S/P implantation of automatic cardioverter/defibrillator (AICD)    a. Medtronic, implanted 2012.   Sleep apnea    Stroke [facility]) 2004   Thyroid disease     Family History  Problem Relation Age of Onset   Breast cancer Mother    Multiple myeloma Mother    Prostate cancer Father    Diabetes Father    Peripheral vascular disease Father    Cerebral palsy Daughter    Lung disease Neg Hx    Colon cancer Neg Hx    Stomach cancer Neg Hx    Esophageal cancer Neg Hx    Colon polyps Neg Hx     Past Surgical History:  Procedure Laterality Date   CARDIOVERSION N/A 01/07/2014   Procedure: CARDIOVERSION;  Surgeon: Lelon Perla, MD;  Location: [facility] ENDOSCOPY;  Service: Cardiovascular;  Laterality: N/A;   CARDIOVERSION N/A  02/11/2014   Procedure: CARDIOVERSION;  Surgeon: Pixie Casino, MD;  Location: [place];  Service: Cardiovascular;  Laterality: N/A;   CARDIOVERSION N/A 10/01/2020   Procedure: CARDIOVERSION;  Surgeon: Werner Lean, MD;  Location: [facility] ENDOSCOPY;  Service: Cardiovascular;  Laterality: N/A;   COLONOSCOPY WITH PROPOFOL N/A 02/06/2018   Procedure: COLONOSCOPY WITH PROPOFOL;  Surgeon: Yetta Flock, MD;  Location: [facility] ENDOSCOPY;  Service: Gastroenterology;  Laterality: N/A;   ICD GENERATOR CHANGEOUT N/A 01/17/2020   Procedure: ICD GENERATOR CHANGEOUT;  Surgeon: Evans Lance, MD;  Location: [facility] CV LAB;  Service: Cardiovascular;  Laterality: N/A;   LASEK eye surgery Bilateral 05/2018   LASIK Bilateral    PACEMAKER INSERTION     POLYPECTOMY  02/06/2018   Procedure: POLYPECTOMY;  Surgeon: Yetta Flock, MD;  Location: [facility] ENDOSCOPY;  Service: Gastroenterology;;  TEE WITHOUT CARDIOVERSION N/A 01/07/2014   Procedure: TRANSESOPHAGEAL ECHOCARDIOGRAM (TEE);  Surgeon: Lelon Perla, MD;  Location: [facility] ENDOSCOPY;  Service: Cardiovascular;  Laterality: N/A;   TEE WITHOUT CARDIOVERSION N/A 02/11/2014   Procedure: TRANSESOPHAGEAL ECHOCARDIOGRAM (TEE);  Surgeon: Pixie Casino, MD;  Location: [facility] ENDOSCOPY;  Service: Cardiovascular;  Laterality: N/A;   TEE WITHOUT CARDIOVERSION N/A 10/01/2020   Procedure: TRANSESOPHAGEAL ECHOCARDIOGRAM (TEE);  Surgeon: Werner Lean, MD;  Location: [facility] ENDOSCOPY;  Service: Cardiovascular;  Laterality: N/A;   TONSILLECTOMY     TRANSTHORACIC ECHOCARDIOGRAM  10/2006, 12/2009   Social History   Occupational History   Occupation: Merchandiser, retail: UNEMPLOYED    Comment: Writer in the past  Tobacco Use   Smoking status: Former    Packs/day: 0.50    Years: 0.50    Pack years: 0.25    Types: Cigarettes    Quit date: 05/02/2010    Years since quitting: 10.4   Smokeless tobacco: Never   Tobacco comments:     Significant Second-hand and only 3 months himself  Vaping Use   Vaping Use: Never used  Substance and Sexual Activity   Alcohol use: Not Currently    Alcohol/week: 0.0 standard drinks    Comment: rare   Drug use: No    Comment: Reported history of cocaine abuse off and on    Sexual activity: Not on file

## 2020-10-21 DIAGNOSIS — M7021 Olecranon bursitis, right elbow: Secondary | ICD-10-CM

## 2020-10-21 MED ORDER — LIDOCAINE HCL 1 % IJ SOLN
1.0000 mL | INTRAMUSCULAR | Status: AC | PRN
  Administered 2020-10-21: 1 mL

## 2020-10-21 MED ORDER — METHYLPREDNISOLONE ACETATE 40 MG/ML IJ SUSP
40.0000 mg | INTRAMUSCULAR | Status: AC | PRN
  Administered 2020-10-21: 40 mg via INTRA_ARTICULAR

## 2020-10-21 MED ORDER — BUPIVACAINE HCL 0.5 % IJ SOLN
1.0000 mL | INTRAMUSCULAR | Status: AC | PRN
  Administered 2020-10-21: 1 mL via INTRA_ARTICULAR

## 2020-10-22 ENCOUNTER — Encounter: Payer: Self-pay | Admitting: *Deleted

## 2020-10-22 ENCOUNTER — Telehealth (INDEPENDENT_AMBULATORY_CARE_PROVIDER_SITE_OTHER): Payer: Self-pay | Admitting: *Deleted

## 2020-10-22 NOTE — Telephone Encounter (Signed)
-----   Message from [place] Donovan, [place] sent at 10/21/2020 10:40 AM EDT ----- Please call patient.  Blood count is good.  Kidney function is still compromised and we will continue to follow this.  Your BNP is still elevated(this measures heart failure) but it has improved since your hospitalization.  Keep all follow up appts with [place] and cardiology as well.  Follow up as planned.  Thanks, Freeman Caldron, PA-C

## 2020-10-26 LAB — SYNOVIAL FLUID ANALYSIS, COMPLETE
Basophils, %: 0 %
Eosinophils-Synovial: 0 % (ref 0–2)
Lymphocytes-Synovial Fld: 23 % (ref 0–74)
Monocyte/Macrophage: 34 % (ref 0–69)
Neutrophil, Synovial: 43 % — ABNORMAL HIGH (ref 0–24)
Synoviocytes, %: 0 % (ref 0–15)
WBC, Synovial: 144 cells/uL (ref <150)

## 2020-10-26 LAB — ANAEROBIC AND AEROBIC CULTURE
AER RESULT:: NO GROWTH
MICRO NUMBER:: 12032100
MICRO NUMBER:: 12032101
SPECIMEN QUALITY:: ADEQUATE
SPECIMEN QUALITY:: ADEQUATE

## 2020-10-28 NOTE — Progress Notes (Signed)
Pt returned call regarding lab work. Message read to patient, verbalizes understanding. Pt states he has been put of Tamsulosin. Noted historical provider. Pt did not know who prescribed med. Assured pt NT would route to practice for PCPs review.  CB# [phone]. States may leave detailed message.

## 2020-10-30 DIAGNOSIS — I5020 Unspecified systolic (congestive) heart failure: Secondary | ICD-10-CM | POA: Diagnosis not present

## 2020-10-30 DIAGNOSIS — J9601 Acute respiratory failure with hypoxia: Secondary | ICD-10-CM | POA: Diagnosis not present

## 2020-10-30 DIAGNOSIS — J690 Pneumonitis due to inhalation of food and vomit: Secondary | ICD-10-CM | POA: Diagnosis not present

## 2020-11-04 ENCOUNTER — Other Ambulatory Visit: Payer: Self-pay

## 2020-11-04 ENCOUNTER — Ambulatory Visit (INDEPENDENT_AMBULATORY_CARE_PROVIDER_SITE_OTHER): Payer: Medicaid Other | Admitting: Cardiovascular Disease

## 2020-11-04 ENCOUNTER — Encounter: Payer: Self-pay | Admitting: Cardiovascular Disease

## 2020-11-04 ENCOUNTER — Other Ambulatory Visit: Payer: Self-pay | Admitting: Cardiovascular Disease

## 2020-11-04 VITALS — BP 126/80 | HR 84 | Ht 75.0 in | Wt 276.6 lb

## 2020-11-04 DIAGNOSIS — I5022 Chronic systolic (congestive) heart failure: Secondary | ICD-10-CM

## 2020-11-04 DIAGNOSIS — I10 Essential (primary) hypertension: Secondary | ICD-10-CM | POA: Diagnosis not present

## 2020-11-04 DIAGNOSIS — I2510 Atherosclerotic heart disease of native coronary artery without angina pectoris: Secondary | ICD-10-CM | POA: Diagnosis not present

## 2020-11-04 DIAGNOSIS — N184 Chronic kidney disease, stage 4 (severe): Secondary | ICD-10-CM

## 2020-11-04 DIAGNOSIS — Z95810 Presence of automatic (implantable) cardiac defibrillator: Secondary | ICD-10-CM

## 2020-11-04 DIAGNOSIS — I4819 Other persistent atrial fibrillation: Secondary | ICD-10-CM

## 2020-11-04 MED ORDER — SACUBITRIL-VALSARTAN 49-51 MG PO TABS: 1.0000 | ORAL_TABLET | Freq: Two times a day (BID) | ORAL | 11 refills | Status: DC

## 2020-11-04 MED ORDER — AMLODIPINE BESYLATE 5 MG PO TABS: 5.0000 mg | ORAL_TABLET | Freq: Every day | ORAL | 3 refills | Status: DC

## 2020-11-04 NOTE — Patient Instructions (Signed)
Medication Instructions:  Your physician has recommended you make the following change in your medication:  1.) re start Entresto 49-51 mg --one tablet twice a day 2.) decrease amlodipine to 5 mg -one tablet daily  *If you need a refill on your cardiac medications before your next appointment, please call your pharmacy*   Lab Work: BMET in about 2 weeks at PharmD visit   Testing/Procedures: none   Follow-Up: At [facility], you and your health needs are our priority.  As part of our continuing mission to provide you with exceptional heart care, we have created designated Provider Care Teams.  These Care Teams include your primary Cardiologist (physician) and Advanced Practice Providers (APPs -  Physician Assistants and Nurse Practitioners) who all work together to provide you with the care you need, when you need it.  We recommend signing up for the patient portal called "MyChart".  Sign up information is provided on this After Visit Summary.  MyChart is used to connect with patients for Virtual Visits (Telemedicine).  Patients are able to view lab/test results, encounter notes, upcoming appointments, etc.  Non-urgent messages can be sent to your provider as well.   To learn more about what you can do with MyChart, go to [email or web address].    Your next appointment:   2 week(s)  The format for your next appointment:   In Person  Provider:   PharmD [facility]   Other Instructions  Please bring your home medications with you to your next visit!

## 2020-11-04 NOTE — Progress Notes (Signed)
Cardiology Office Note:    Date:  11/08/2020   ID:  Cody Ortega, DOB [birth year], MRN [id number]  PCP:  Ladell Pier, MD   [facility] Providers Cardiologist:  [name], MD Electrophysiologist:  Cristopher Peru, MD     Referring MD: Ladell Pier, MD   Chief Complaint  Patient presents with   Shortness of Breath    History of Present Illness:    Cody Ortega is a 60 y.o. 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 Havery Moros), anxiety, arthritis, asthma, paroxysmal atrial fibrillation requiring prior TEE/DCCV treated with amiodarone, diverticulosis, HTN, stroke, sleep apnea (intolerant of CPAP, uses nose strips), CKD stage IV (followed by Dr. Candiss Norse) and thyroid disease.  The patient was found to be in recurrent atrial fibrillation in May 2022.  He underwent cardioversion October 01, 2020.  The following day he developed severe shortness of breath and was found to have bilateral pulmonary infiltrates concerning for aspiration pneumonia.  He was started on antibiotics at that time and at the time of his recent follow-up in the atrial fibrillation clinic June 15 he noted significant improvement.  The patient is here alone today.  He notes that his symptoms are much improved.  He is complained of chronic cough and phlegm production over the years, but notes that this is improved over recent weeks.  He has been taking his medications more regularly.  He has had no recent chest pain or pressure.  The patient is now sleeping with oxygen and feels much better with this.  He occasionally uses oxygen after physical activity.  He has a sleep study currently pending.  He denies leg edema or recurrent heart palpitations. Past Medical History:  Diagnosis Date    Anxiety    Arthritis    Benzodiazepine dependence (HCC)    Bronchial asthma    CAD (coronary artery disease)    a. Cath 03/2010: mod RCA stenosis, severe diag stenosis, treated medically given lack of angina.   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 Episode in 2008 with spontaneous conversion, on amiodarone per EP.   S/P implantation of automatic cardioverter/defibrillator (AICD)    a. Medtronic, implanted 2012.   Sleep apnea    Stroke [facility]) 2004   Thyroid disease     Past Surgical History:  Procedure Laterality Date   CARDIOVERSION N/A 01/07/2014   Procedure: CARDIOVERSION;  Surgeon: Lelon Perla, MD;  Location: [facility] ENDOSCOPY;  Service: Cardiovascular;  Laterality: N/A;   CARDIOVERSION N/A 02/11/2014   Procedure: CARDIOVERSION;  Surgeon: Pixie Casino, MD;  Location: [place];  Service: Cardiovascular;  Laterality: N/A;   CARDIOVERSION N/A 10/01/2020   Procedure: CARDIOVERSION;  Surgeon: Werner Lean, MD;  Location: [place];  Service: Cardiovascular;  Laterality: N/A;   COLONOSCOPY WITH PROPOFOL N/A 02/06/2018   Procedure: COLONOSCOPY WITH PROPOFOL;  Surgeon: Yetta Flock, MD;  Location: [facility] ENDOSCOPY;  Service: Gastroenterology;  Laterality: N/A;   ICD GENERATOR CHANGEOUT N/A 01/17/2020   Procedure: ICD GENERATOR CHANGEOUT;  Surgeon: Evans Lance, MD;  Location: [place] CV LAB;  Service: Cardiovascular;  Laterality: N/A;   LASEK eye surgery Bilateral 05/2018   LASIK Bilateral    PACEMAKER INSERTION      POLYPECTOMY  02/06/2018   Procedure: POLYPECTOMY;  Surgeon: Yetta Flock, MD;  Location: [facility] ENDOSCOPY;  Service: Gastroenterology;;   TEE WITHOUT CARDIOVERSION N/A 01/07/2014   Procedure: TRANSESOPHAGEAL ECHOCARDIOGRAM (TEE);  Surgeon: Lelon Perla, MD;  Location: [facility] ENDOSCOPY;  Service: Cardiovascular;  Laterality: N/A;   TEE WITHOUT CARDIOVERSION N/A 02/11/2014   Procedure: TRANSESOPHAGEAL ECHOCARDIOGRAM (TEE);  Surgeon: Pixie Casino, MD;  Location: [facility] ENDOSCOPY;  Service: Cardiovascular;  Laterality: N/A;   TEE WITHOUT CARDIOVERSION N/A 10/01/2020   Procedure: TRANSESOPHAGEAL ECHOCARDIOGRAM (TEE);  Surgeon: Werner Lean, MD;  Location[facility] ENDOSCOPY;  Service: Cardiovascular;  Laterality: N/A;   TONSILLECTOMY     TRANSTHORACIC ECHOCARDIOGRAM  10/2006, 12/2009    Current Medications: Current Meds  Medication Sig   acetaminophen (TYLENOL) 500 MG tablet Take 1,000 mg by mouth every 6 (six) hours as needed for moderate pain or headache.   albuterol (PROVENTIL) (2.5 MG/3ML) 0.083% nebulizer solution Take 3 mLs (2.5 mg total) by nebulization every 6 (six) hours as needed for wheezing or shortness of breath.   albuterol (VENTOLIN HFA) 108 (90 Base) MCG/ACT inhaler INHALE 2 PUFFS INTO THE LUNGS EVERY 6 HOURS AS NEEDED FOR WHEEZING OR SHORTNESS OF BREATH   alprazolam (XANAX) 2 MG tablet Take 2 mg by mouth every 6 (six) hours as needed for anxiety.   amiodarone (PACERONE) 200 MG tablet Take 1 tablet (200 mg total) by mouth daily.   amLODipine (NORVASC) 5 MG tablet Take 1 tablet (5 mg total) by mouth daily.   carvedilol (COREG) 12.5 MG tablet TAKE 1 TABLET(12.5 MG) BY MOUTH TWICE DAILY WITH A MEAL   dapagliflozin propanediol (FARXIGA) 10 MG TABS tablet Take 1 tablet (10 mg total) by mouth daily before breakfast.   Dextran 70-Hypromellose, PF, ([employer] NATURAL TEARS) 0.1-0.3 % SOLN Place 1 drop into both eyes daily as needed (dry eyes).   DULoxetine (CYMBALTA) 30 MG capsule Take 90 mg  by mouth daily.    entecavir (BARACLUDE) 0.5 MG tablet Take 1 tablet (0.5 mg total) by mouth every other day. Please note: Every OTHER day   ferrous sulfate 325 (65 FE) MG tablet Take 325 mg by mouth daily with breakfast.   fluticasone (FLONASE) 50 MCG/ACT nasal spray SHAKE LIQUID AND USE 1 SPRAY IN EACH NOSTRIL DAILY   furosemide (LASIX) 40 MG tablet One tablet by mouth ( 40 mg) daily.   hydrALAZINE (APRESOLINE) 50 MG tablet TAKE 1 TABLET(50 MG) BY MOUTH TWICE DAILY   ondansetron (ZOFRAN-ODT) 8 MG disintegrating tablet Take 8 mg by mouth 2 (two) times daily as needed for nausea or vomiting.   oxybutynin (DITROPAN) 5 MG tablet Take 5 mg by mouth 3 (three) times daily.   polyethylene glycol (MIRALAX) 17 g packet Take as directed, Daily, titrate as needed   sacubitril-valsartan (ENTRESTO) 49-51 MG Take 1 tablet by mouth 2 (two) times daily.   sildenafil (REVATIO) 20 MG tablet Take 2-5 tablets by mouth once daily as needed prior to sexual activity.   SYMBICORT 160-4.5 MCG/ACT  inhaler Inhale 2 puffs into the lungs 2 (two) times daily.   tamsulosin (FLOMAX) 0.4 MG CAPS capsule Take 0.4 mg by mouth 2 (two) times daily.   XARELTO 20 MG TABS tablet TAKE 1 TABLET(20 MG) BY MOUTH DAILY WITH SUPPER   [DISCONTINUED] amLODipine (NORVASC) 10 MG tablet Take 10 mg by mouth daily. Per patient taking 1 tablet twice a day   [DISCONTINUED] ezetimibe (ZETIA) 10 MG tablet TAKE 1 TABLET(10 MG) BY MOUTH DAILY     Allergies:   Prochlorperazine, Codeine, and Hydrocodone-acetaminophen   Social History   Socioeconomic History   Marital status: Divorced    Spouse name: Not on file   Number of children: 2   Years of education: Not on file   Highest education level: Not on file  Occupational History   Occupation: Unemployed    Employer: UNEMPLOYED    Comment: Writer in the past  Tobacco Use   Smoking status: Former    Packs/day: 0.50    Years: 0.50    Pack years: 0.25    Types: Cigarettes    Quit  date: 05/02/2010    Years since quitting: 10.5   Smokeless tobacco: Never   Tobacco comments:    Significant Second-hand and only 3 months himself  Vaping Use   Vaping Use: Never used  Substance and Sexual Activity   Alcohol use: Not Currently    Alcohol/week: 0.0 standard drinks    Comment: rare   Drug use: No    Comment: Reported history of cocaine abuse off and on    Sexual activity: Not on file  Other Topics Concern   Not on file  Social History Narrative   Living with a son who is a 35 year old.  He is not     working, unemployed for 3 years.  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  Denied use of alcohol.  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       [facility]:   Originally from [place], [place]. He grew up in [place]. He moved to [place] in 1985. 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 No pets currently. No bird exposure. He did have mold in a previous home. No hot tub exposure.         Social Determinants of Health   Financial Resource Strain: Not on file  Food Insecurity: Not on file  Transportation Needs: Not on file  Physical Activity: Not on file  Stress: Not on file  Social Connections: Not on file     Family History: The patient's family history includes Breast cancer in his mother; Cerebral palsy in his daughter; Diabetes in his father; Multiple myeloma in his mother; Peripheral vascular disease in his father; Prostate cancer in his father. There is no history of Lung disease, Colon cancer, Stomach cancer, Esophageal cancer, or Colon polyps.  ROS:   Please see the history of present illness.    All other systems reviewed and are negative.  EKGs/Labs/Other Studies Reviewed:    The following studies were reviewed today: 2D echocardiogram 10/01/2020: 1. Left ventricular ejection fraction, by estimation, is 30 to 35%. Left  ventricular ejection fraction by 3D volume is 32 %. The  left ventricle has  moderately decreased function. The left ventricle demonstrates global  hypokinesis. The left ventricular  internal cavity size was mildly to moderately dilated.   2. Right ventricular systolic function  is mildly reduced. The right  ventricular size is normal. In the gastric views, there appears to be a  small echodensity on the device lead (2 cm at largest). In evalation of  the valve in the bicaval view there does  not appears to be evidence of abnormal echodensity (see long series 68).  Clinical Correlation advised.   3. Left atrial size was severely dilated. No left atrial/left atrial  appendage thrombus was detected.   4. Right atrial size was mildly dilated.   5. The mitral valve is normal in structure. Mild to moderate mitral valve  regurgitation, central in nature with VCW 0.5 and with pulmonary vein  systolic blunting.   6. The aortic valve is tricuspid. Aortic valve regurgitation is not  visualized.   EKG:  EKG is not ordered today.    Recent Labs: 05/08/2020: NT-Pro BNP 1,120 09/15/2020: TSH 2.390 10/03/2020: Magnesium 2.0 10/15/2020: ALT 13; BNP 324.5; BUN 26; Creatinine, Ser 2.11; Hemoglobin 16.0; Platelets 272; Potassium 3.6; Sodium 139  Recent Lipid Panel    Component Value Date/Time   CHOL 196 05/08/2020 1332   TRIG 135 05/08/2020 1332   HDL 33 (L) 05/08/2020 1332   CHOLHDL 5.9 (H) 05/08/2020 1332   CHOLHDL 3.9 10/12/2015 1026   VLDL 27 10/12/2015 1026   LDLCALC 138 (H) 05/08/2020 1332   LDLDIRECT 102 (H) 09/15/2020 1635     Risk Assessment/Calculations:    CHA2DS2-VASc Score = 5  This indicates a 7.2% annual risk of stroke. The patient's score is based upon: CHF History: Yes HTN History: Yes Diabetes History: No Stroke History: Yes Vascular Disease History: Yes Age Score: 0 Gender Score: 0          Physical Exam:    VS:  BP 126/80   Pulse 84   Ht $R'6\' 3"'KD$  (1.905 m)   Wt 276 lb 9.6 oz (125.5 kg)   SpO2 98%   BMI 34.57 kg/m      Wt Readings from Last 3 Encounters:  11/04/20 276 lb 9.6 oz (125.5 kg)  10/15/20 269 lb 3.2 oz (122.1 kg)  10/14/20 266 lb (120.7 kg)     GEN:  Well nourished, well developed in no acute distress HEENT: Normal NECK: No JVD; No carotid bruits LYMPHATICS: No lymphadenopathy CARDIAC: RRR, no murmurs, rubs, gallops RESPIRATORY:  Clear to auscultation without rales, wheezing or rhonchi  ABDOMEN: Soft, non-tender, non-distended MUSCULOSKELETAL:  No edema; No deformity  SKIN: Warm and dry NEUROLOGIC:  Alert and oriented x 3 PSYCHIATRIC:  Normal affect   ASSESSMENT:    1. Chronic systolic CHF (congestive heart failure) (HCC)   2. Persistent atrial fibrillation ([place])   3. Automatic implantable cardioverter-defibrillator in situ   4. CKD (chronic kidney disease), stage IV ([place])   5. Essential hypertension   6. Coronary artery disease involving native coronary artery of native heart without angina pectoris    PLAN:    In order of problems listed above:  The patient is better compensated.  He has struggled over time with atrial fibrillation causing progressive heart failure.  Maintaining sinus rhythm is a key for him.  He will remain on amiodarone.  I will add back Entresto 49/51 mg daily.  I will reduce his amlodipine to 5 mg daily and ideally we will get him off of this over time.  We will request pharmacy follow-up and labs in 2 weeks for further medicine titration.  We have to keep a close eye on his renal function with reinitiation of  Entresto. Continue amiodarone.  Continue rivaroxaban for anticoagulation.  Continue carvedilol. Followed by EP Most recent creatinine 2.11.  Creatinine typically ranges from 2.0-2.7.  Follow closely with his heart failure regimen as outlined above Blood pressure better controlled on current therapy.  Will reduce amlodipine and resume Entresto as outlined continue carvedilol at current dose. No angina at present.  No aspirin in the setting of oral  anticoagulation.  Overall the patient remains somewhat tenuous but clinically stable today.  He is on a very complex medical regimen and I hope he will bring his medicines to his pharmacy heart failure follow-up visit in a few weeks.        Medication Adjustments/Labs and Tests Ordered: Current medicines are reviewed at length with the patient today.  Concerns regarding medicines are outlined above.  Orders Placed This Encounter  Procedures   Basic metabolic panel   Meds ordered this encounter  Medications   amLODipine (NORVASC) 5 MG tablet    Sig: Take 1 tablet (5 mg total) by mouth daily.    Dispense:  90 tablet    Refill:  3    Dose decrease   sacubitril-valsartan (ENTRESTO) 49-51 MG    Sig: Take 1 tablet by mouth 2 (two) times daily.    Dispense:  60 tablet    Refill:  11    Please do not fill at this time, pt has adequate supply at home.    Patient Instructions  Medication Instructions:  Your physician has recommended you make the following change in your medication:  1.) re start Entresto 49-51 mg --one tablet twice a day 2.) decrease amlodipine to 5 mg -one tablet daily  *If you need a refill on your cardiac medications before your next appointment, please call your pharmacy*   Lab Work: BMET in about 2 weeks at PharmD visit   Testing/Procedures: none   Follow-Up: At [facility], you and your health needs are our priority.  As part of our continuing mission to provide you with exceptional heart care, we have created designated Provider Care Teams.  These Care Teams include your primary Cardiologist (physician) and Advanced Practice Providers (APPs -  Physician Assistants and Nurse Practitioners) who all work together to provide you with the care you need, when you need it.  We recommend signing up for the patient portal called "MyChart".  Sign up information is provided on this After Visit Summary.  MyChart is used to connect with patients for Virtual Visits  (Telemedicine).  Patients are able to view lab/test results, encounter notes, upcoming appointments, etc.  Non-urgent messages can be sent to your provider as well.   To learn more about what you can do with MyChart, go to [email or web address].    Your next appointment:   2 week(s)  The format for your next appointment:   In Person  Provider:   PharmD [facility]   Other Instructions  Please bring your home medications with you to your next visit!   Signed, [name], MD  11/08/2020 7:34 PM    [place]

## 2020-11-08 ENCOUNTER — Encounter: Payer: Self-pay | Admitting: Cardiovascular Disease

## 2020-11-09 ENCOUNTER — Other Ambulatory Visit: Payer: Self-pay | Admitting: Internal Medicine

## 2020-11-10 ENCOUNTER — Other Ambulatory Visit: Payer: Self-pay

## 2020-11-10 MED ORDER — DAPAGLIFLOZIN PROPANEDIOL 10 MG PO TABS: 10.0000 mg | ORAL_TABLET | Freq: Every day | ORAL | 5 refills | Status: DC

## 2020-11-12 ENCOUNTER — Other Ambulatory Visit: Payer: Self-pay

## 2020-11-12 ENCOUNTER — Other Ambulatory Visit: Payer: Medicaid Other

## 2020-11-12 DIAGNOSIS — I5022 Chronic systolic (congestive) heart failure: Secondary | ICD-10-CM

## 2020-11-13 LAB — BASIC METABOLIC PANEL
BUN/Creatinine Ratio: 17 (ref 9–20)
BUN: 35 mg/dL — ABNORMAL HIGH (ref 6–24)
CO2: 24 mmol/L (ref 20–29)
Calcium: 9.4 mg/dL (ref 8.7–10.2)
Chloride: 101 mmol/L (ref 96–106)
Creatinine, Ser: 2.04 mg/dL — ABNORMAL HIGH (ref 0.76–1.27)
Glucose: 102 mg/dL — ABNORMAL HIGH (ref 65–99)
Potassium: 4.3 mmol/L (ref 3.5–5.2)
Sodium: 143 mmol/L (ref 134–144)
eGFR: 37 mL/min/{1.73_m2} — ABNORMAL LOW (ref >59)

## 2020-11-16 ENCOUNTER — Other Ambulatory Visit: Payer: Self-pay | Admitting: Internal Medicine

## 2020-11-16 DIAGNOSIS — J449 Chronic obstructive pulmonary disease, unspecified: Secondary | ICD-10-CM

## 2020-11-16 NOTE — Telephone Encounter (Signed)
   Notes to clinic:  requested script has expired  Review for continued use and refill    Requested Prescriptions  Pending Prescriptions Disp Refills   albuterol (VENTOLIN HFA) 108 (90 Base) MCG/ACT inhaler [Pharmacy Med Name: ALBUTEROL HFA INH (200 PUFFS)8.5GM] 8.5 g 6    Sig: INHALE 2 PUFFS INTO THE LUNGS EVERY 6 HOURS AS NEEDED FOR WHEEZING OR SHORTNESS OF BREATH      Pulmonology:  Beta Agonists Failed - 11/16/2020  1:06 AM      Failed - One inhaler should last at least one month. If the patient is requesting refills earlier, contact the patient to check for uncontrolled symptoms.      Passed - Valid encounter within last 12 months    Recent Outpatient Visits           1 month ago Essential hypertension   [place], [place], [place]   10 months ago Essential hypertension   [place], [place]   1 year ago Class 2 severe obesity due to excess calories with serious comorbidity and body mass index (BMI) of 35.0 to 35.9 in adult [facility])   [place] Ladell Pier, MD   1 year ago Need for vaccination against Streptococcus pneumoniae using pneumococcal conjugate vaccine [place], Jarome Matin, RPH-CPP   1 year ago Stage 3b chronic kidney disease   [place], MD       Future Appointments             In 3 days  [place], [place]   In 3 weeks Ladell Pier, MD [place]

## 2020-11-19 ENCOUNTER — Telehealth: Payer: Self-pay | Admitting: Pharmacist

## 2020-11-19 ENCOUNTER — Ambulatory Visit (INDEPENDENT_AMBULATORY_CARE_PROVIDER_SITE_OTHER): Payer: Medicaid Other | Admitting: Pharmacist

## 2020-11-19 ENCOUNTER — Other Ambulatory Visit: Payer: Self-pay

## 2020-11-19 ENCOUNTER — Other Ambulatory Visit: Payer: Medicaid Other

## 2020-11-19 VITALS — BP 136/84 | HR 53 | Wt 275.0 lb

## 2020-11-19 DIAGNOSIS — I5022 Chronic systolic (congestive) heart failure: Secondary | ICD-10-CM

## 2020-11-19 DIAGNOSIS — I5020 Unspecified systolic (congestive) heart failure: Secondary | ICD-10-CM | POA: Diagnosis not present

## 2020-11-19 DIAGNOSIS — E782 Mixed hyperlipidemia: Secondary | ICD-10-CM | POA: Diagnosis not present

## 2020-11-19 DIAGNOSIS — I10 Essential (primary) hypertension: Secondary | ICD-10-CM

## 2020-11-19 MED ORDER — ATORVASTATIN CALCIUM 10 MG PO TABS: 10.0000 mg | ORAL_TABLET | Freq: Every day | ORAL | 11 refills | Status: DC

## 2020-11-19 MED ORDER — SPIRONOLACTONE 25 MG PO TABS: 25.0000 mg | ORAL_TABLET | Freq: Every day | ORAL | 11 refills | Status: DC

## 2020-11-19 NOTE — Progress Notes (Addendum)
Patient ID: Cody Ortega                 DOB: [birth year]                      MRN: [id number]     HPI: Cody Ortega is a 60 y.o. male referred by Dr. Burt Ortega to pharmacy clinic for HF medication management. 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 He was seen most recently by Dr Cody Ortega on 11/04/20. Amlodipine was decreased to $RemoveBefo'5mg'qeaQbzFVGPy$  with eventual goal to stop, and Entresto 49-$RemoveBeforeD'51mg'vlmYZGbmWtFrgF$  BID was started. F/u BMET stable. He presents today for follow up.  Pt presents today in good spirits. Brings in all home medications. Many meds have multiple pill bottles - has 3 bottles of his Entresto, 4 bottles of his ezetimibe, 6 bottles of his duloxetine. Gets a bit overwhelmed/anxious when he sets up his pill box. Hasn't picked up new amiodarone dose so current bottles still have BID instead of daily dosing. I have consolidated all medication bottles as much as possible for pt. Amiodarone frequency updated on his current prescriptions. Fell asleep early last night and missed evening meds, took them around 1AM when he woke up, and hasn't taken today's AM doses yet. Denies bleeding issues on Xarelto.   Home BP typically 130/60s. Highest 140/80-90, lowest 111/77. Occasionally SOB with exertion, has nasal cannula at home which he uses if needed, also uses this when he sleeps on a regular basis which has helped improve his sleep quality. Denies LE edema, headache. States he was denied Medicare disability, may reapply. Currently has Medicaid, prescription copays are affordable at $3. Reports he needs new handicap sticker within the next few months.  Current CHF meds:   Carvedilol 12.$RemoveBefore'5mg'DuSnEnoesFOtP$  BID Entresto 49-$RemoveBeforeD'51mg'PEbwSdHeSRpmwJ$  BID Farxiga $RemoveBef'10mg'FfQFxzmSer$  daily Hydralazine $RemoveBeforeDEI'50mg'BjEkYhWKsRvYGBAA$  BID Furosemide $RemoveBefore'40mg'kAGfGOhaqDEou$  daily Also on amlodipine $RemoveBefor'5mg'aPePTmdEjaVx$  daily for BP  BP goal: <130/37mmHg  Family History: Breast cancer in his mother; Cerebral palsy in his daughter; Diabetes in his father; Multiple myeloma in his mother; Peripheral vascular disease in his father; Prostate cancer in his father. There is no history of Lung disease, Colon cancer, Stomach cancer, Esophageal cancer, or Colon polyps  Social History: Former tobacco abuse 1/2 PPD, quit in 2012. On and off cocaine abuse. Denies alcohol use.  Diet: Saltine crackers, peanut butter sandwich, in the evening likes chicken skewers, [place], brisket etc  Exercise: Bad back and knees, plans to use the pool more  Home BP readings: 130/60s. Highest 140/80-90, lowest 111/77.   Wt Readings from Last 3 Encounters:  11/04/20 276 lb 9.6 oz (125.5 kg)  10/15/20 269 lb 3.2 oz (122.1 kg)  10/14/20 266 lb (120.7 kg)   BP Readings from Last 3 Encounters:  11/04/20 126/80  10/15/20 136/67  10/14/20 116/70   Pulse Readings from Last 3 Encounters:  11/04/20 84  10/15/20 67  10/14/20 (!) 52    Renal function: CrCl cannot be calculated (Unknown ideal weight.).  Past Medical History:  Diagnosis Date   Anxiety    Arthritis    Benzodiazepine dependence (HCC)    Bronchial asthma    CAD (coronary artery disease)    a. Cath 03/2010: mod RCA  stenosis, severe diag stenosis, treated medically given lack of angina.   Cardiomyopathy    possible cocaine induced   Chronic systolic congestive heart failure (HCC)    Cirrhosis (HCC)    secondary to hepatitis B   CKD (chronic kidney disease), stage IV (HCC)    Stage 3-4   Depression    Diverticulosis    Hepatitis B    History of cocaine abuse ([place])    Hyperlipidemia LDL goal <70    Hypermetropia of both eyes not needing correction 09/29/2020   Hypertension    Microcytic anemia    Obesity    Persistent atrial  fibrillation ([place])    a. Episode in 2008 with spontaneous conversion, on amiodarone per EP.   S/P implantation of automatic cardioverter/defibrillator (AICD)    a. Medtronic, implanted 2012.   Sleep apnea    Stroke [facility]) 2004   Thyroid disease     Current Outpatient Medications on File Prior to Visit  Medication Sig Dispense Refill   albuterol (VENTOLIN HFA) 108 (90 Base) MCG/ACT inhaler INHALE 2 PUFFS INTO THE LUNGS EVERY 6 HOURS AS NEEDED FOR WHEEZING OR SHORTNESS OF BREATH 8.5 g 6   acetaminophen (TYLENOL) 500 MG tablet Take 1,000 mg by mouth every 6 (six) hours as needed for moderate pain or headache.     albuterol (PROVENTIL) (2.5 MG/3ML) 0.083% nebulizer solution Take 3 mLs (2.5 mg total) by nebulization every 6 (six) hours as needed for wheezing or shortness of breath. 150 mL 1   alprazolam (XANAX) 2 MG tablet Take 2 mg by mouth every 6 (six) hours as needed for anxiety.     amiodarone (PACERONE) 200 MG tablet Take 1 tablet (200 mg total) by mouth daily. 180 tablet 3   amLODipine (NORVASC) 5 MG tablet Take 1 tablet (5 mg total) by mouth daily. 90 tablet 3   carvedilol (COREG) 12.5 MG tablet TAKE 1 TABLET(12.5 MG) BY MOUTH TWICE DAILY WITH A MEAL 180 tablet 3   dapagliflozin propanediol (FARXIGA) 10 MG TABS tablet Take 1 tablet (10 mg total) by mouth daily before breakfast. 30 tablet 5   Dextran 70-Hypromellose, PF, ([employer] NATURAL TEARS) 0.1-0.3 % SOLN Place 1 drop into both eyes daily as needed (dry eyes).     DULoxetine (CYMBALTA) 30 MG capsule Take 90 mg by mouth daily.      entecavir (BARACLUDE) 0.5 MG tablet Take 1 tablet (0.5 mg total) by mouth every other day. Please note: Every OTHER day 45 tablet 1   ezetimibe (ZETIA) 10 MG tablet TAKE 1 TABLET(10 MG) BY MOUTH DAILY 90 tablet 2   ferrous sulfate 325 (65 FE) MG tablet Take 325 mg by mouth daily with breakfast.     fluticasone (FLONASE) 50 MCG/ACT nasal spray SHAKE LIQUID AND USE 1 SPRAY IN EACH NOSTRIL DAILY 16 g 2   furosemide  (LASIX) 40 MG tablet One tablet by mouth ( 40 mg) daily. 90 tablet 3   hydrALAZINE (APRESOLINE) 50 MG tablet TAKE 1 TABLET(50 MG) BY MOUTH TWICE DAILY 180 tablet 3   ondansetron (ZOFRAN-ODT) 8 MG disintegrating tablet Take 8 mg by mouth 2 (two) times daily as needed for nausea or vomiting.     oxybutynin (DITROPAN) 5 MG tablet Take 5 mg by mouth 3 (three) times daily.     polyethylene glycol (MIRALAX) 17 g packet Take as directed, Daily, titrate as needed 14 each 0   sacubitril-valsartan (ENTRESTO) 49-51 MG Take 1 tablet by mouth 2 (two) times daily. 60 tablet  11   sildenafil (REVATIO) 20 MG tablet Take 2-5 tablets by mouth once daily as needed prior to sexual activity. 100 tablet 3   SYMBICORT 160-4.5 MCG/ACT inhaler Inhale 2 puffs into the lungs 2 (two) times daily. 10.2 g 2   tamsulosin (FLOMAX) 0.4 MG CAPS capsule Take 0.4 mg by mouth 2 (two) times daily.     XARELTO 20 MG TABS tablet TAKE 1 TABLET(20 MG) BY MOUTH DAILY WITH SUPPER 90 tablet 1   No current facility-administered medications on file prior to visit.    Allergies  Allergen Reactions   Prochlorperazine Other (See Comments)    Medication caused a lot of issues like blurred vision, nausea, pain, and carpool turnel   Codeine Itching, Nausea And Vomiting and Hives   Hydrocodone-Acetaminophen Itching and Nausea And Vomiting     Assessment/Plan:  1. CHF with LVEF 30-35% - BP close to goal <130/98mmHg with room to optimize CHF meds. Will stop amlodipine 5mg  daily and start spironolactone 25mg  daily. Continue carvedilol 12.5mg  BID (HR 50s), Entresto 49-51mg  BID, Farxiga 10mg  daily, hydralazine 50mg  BID, and furosemide 40mg  daily. All meds consolidated at today's visit to help pt when he's filling pill boxes at home. Recheck BMET in 1 week (has CKD with SCr 2-2.4 over past month to two). F/u with me in 3 weeks with plan to continue Entresto dose titration if renal function allows.  2. Hyperlipidemia - LDL 138 in Jan 2022 above goal  < 70 due to hx of CAD and stroke. Pt currently taking ezetimibe 10mg  daily. Previously took pravastatin which was stopped in 2019 when pt was dx with Hepatitis B and cirrhosis. He has been following with Dr Havery Moros, follow up Hep B viral loads have been undetectable on entecavir therapy. Sent him a message to see if he is comfortable with [place] restarting statin therapy, he is ok with statin rechallenge at low dose with close monitoring. Will start atorvastatin 10mg  daily (preferred statin in CKD) and will check LFTs at next visit in 3 weeks.   Iniya Matzek E. Azul Brumett, PharmD, BCACP, [place] [address], [place], [place] [address] Phone: [phone]; Fax: [phone] 11/19/2020 11:32 AM

## 2020-11-19 NOTE — Telephone Encounter (Signed)
Called patient and advised him that Dr. Burt Knack has signed a permanent handicap placard and I offered to place it in the mail to him. He confirmed his address and thanked me for the help.

## 2020-11-19 NOTE — Addendum Note (Signed)
Addended by: Gaetano Net on: 11/19/2020 04:26 PM   Modules accepted: Orders

## 2020-11-19 NOTE — Telephone Encounter (Signed)
Pt seen in clinic today for PharmD CHF follow up. Mentioned as he was leaving that his handicap sticker is expiring within the next few months and he'll need a new one. Forwarding to triage to assist with follow up.

## 2020-11-19 NOTE — Addendum Note (Signed)
Addended by: Tell Rozelle E on: 11/19/2020 04:16 PM   Modules accepted: Orders

## 2020-11-19 NOTE — Patient Instructions (Addendum)
It was nice to meet you today  Your blood pressure goal is < 130/30mmHg  STOP taking amlodipine  START taking spironolactone 25mg  - 1 tablet once daily in the morning  Continue taking your other medications and monitoring your blood pressure at home  Recheck lab work in 1 week and recheck blood pressure in office in 3 weeks

## 2020-11-26 ENCOUNTER — Other Ambulatory Visit: Payer: Self-pay

## 2020-11-26 ENCOUNTER — Ambulatory Visit (INDEPENDENT_AMBULATORY_CARE_PROVIDER_SITE_OTHER): Payer: Medicaid Other | Admitting: Neurology

## 2020-11-26 ENCOUNTER — Other Ambulatory Visit: Payer: Medicaid Other

## 2020-11-26 DIAGNOSIS — G44209 Tension-type headache, unspecified, not intractable: Secondary | ICD-10-CM

## 2020-11-26 DIAGNOSIS — I5022 Chronic systolic (congestive) heart failure: Secondary | ICD-10-CM

## 2020-11-26 DIAGNOSIS — G4761 Periodic limb movement disorder: Secondary | ICD-10-CM

## 2020-11-26 DIAGNOSIS — G4720 Circadian rhythm sleep disorder, unspecified type: Secondary | ICD-10-CM

## 2020-11-26 DIAGNOSIS — Z789 Other specified health status: Secondary | ICD-10-CM

## 2020-11-26 DIAGNOSIS — G4733 Obstructive sleep apnea (adult) (pediatric): Secondary | ICD-10-CM

## 2020-11-26 DIAGNOSIS — G4734 Idiopathic sleep related nonobstructive alveolar hypoventilation: Secondary | ICD-10-CM

## 2020-11-26 DIAGNOSIS — R519 Headache, unspecified: Secondary | ICD-10-CM

## 2020-11-26 DIAGNOSIS — E669 Obesity, unspecified: Secondary | ICD-10-CM

## 2020-11-30 DIAGNOSIS — J690 Pneumonitis due to inhalation of food and vomit: Secondary | ICD-10-CM | POA: Diagnosis not present

## 2020-11-30 DIAGNOSIS — J9601 Acute respiratory failure with hypoxia: Secondary | ICD-10-CM | POA: Diagnosis not present

## 2020-11-30 DIAGNOSIS — I5020 Unspecified systolic (congestive) heart failure: Secondary | ICD-10-CM | POA: Diagnosis not present

## 2020-12-09 ENCOUNTER — Other Ambulatory Visit: Payer: Self-pay

## 2020-12-09 ENCOUNTER — Ambulatory Visit (INDEPENDENT_AMBULATORY_CARE_PROVIDER_SITE_OTHER): Payer: Medicaid Other | Admitting: Pharmacist

## 2020-12-09 VITALS — BP 140/90 | HR 56 | Wt 274.0 lb

## 2020-12-09 DIAGNOSIS — E782 Mixed hyperlipidemia: Secondary | ICD-10-CM

## 2020-12-09 DIAGNOSIS — E785 Hyperlipidemia, unspecified: Secondary | ICD-10-CM | POA: Diagnosis not present

## 2020-12-09 DIAGNOSIS — I5022 Chronic systolic (congestive) heart failure: Secondary | ICD-10-CM | POA: Diagnosis not present

## 2020-12-09 DIAGNOSIS — I10 Essential (primary) hypertension: Secondary | ICD-10-CM

## 2020-12-09 DIAGNOSIS — I2510 Atherosclerotic heart disease of native coronary artery without angina pectoris: Secondary | ICD-10-CM | POA: Diagnosis not present

## 2020-12-09 NOTE — Progress Notes (Signed)
Patient ID: Cody Ortega                 DOB: March 08, 1961                      MRN: [id number]     HPI: Cody Ortega is a 60 y.o. male referred by Dr. Burt Knack to pharmacy clinic for HF medication management. 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 He was seen most recently by Dr Burt Knack on 11/04/20. Amlodipine was decreased to 5mg  with eventual goal to stop, and Entresto 49-51mg  BID was started. F/u BMET stable. I saw pt on 7/21 and stopped amlodipine and started spironolactone 25mg  daily for CHF. All of his home medications were consolidated as well.  Pt presents today in good spirits. Reports tolerating medications well. Denies headache, blurred vision, and LE edema. Using nasal cannula to sleep at night which has improved his sleep quality quite a bit. Less dizziness because of this as well. Has had balance issues for years. Home BP 148/68, 347/42, had one systolic up to 595 when anxious. Thinks he's been a bit dehydrated lately. Trying to drink more water but still drinking 6 regular sodas every day. States this is reduced from 2L per night in the past. Does not like diet soda.  Has Medicaid insurance, branded prescription copays are affordable at $3.   Current CHF meds:  Carvedilol 12.5mg  BID Entresto 49-51mg  BID Farxiga 10mg  daily Spironolactone 25mg  daily Hydralazine 50mg  BID Furosemide 40mg  daily  BP goal: <130/66mmHg  Family History: Breast cancer in his mother; Cerebral palsy in his daughter; Diabetes in his father; Multiple myeloma in his mother; Peripheral vascular disease in his father; Prostate cancer in his father. There  is no history of Lung disease, Colon cancer, Stomach cancer, Esophageal cancer, or Colon polyps  Social History: Former tobacco abuse 1/2 PPD, quit in 2012. On and off cocaine abuse. Denies alcohol use.  Diet: Saltine crackers, peanut butter sandwich, in the evening likes chicken skewers, [place], brisket etc  Exercise: Bad back and knees, plans to use the pool more  Wt Readings from Last 3 Encounters:  11/19/20 275 lb (124.7 kg)  11/04/20 276 lb 9.6 oz (125.5 kg)  10/15/20 269 lb 3.2 oz (122.1 kg)   BP Readings from Last 3 Encounters:  11/19/20 136/84  11/04/20 126/80  10/15/20 136/67   Pulse Readings from Last 3 Encounters:  11/19/20 (!) 53  11/04/20 84  10/15/20 67    Renal function: CrCl cannot be calculated (Patient's most recent lab result is older than the maximum 21 days allowed.).  Past Medical History:  Diagnosis Date   Anxiety    Arthritis    Benzodiazepine dependence (HCC)    Bronchial asthma    CAD (coronary artery disease)    a. Cath 03/2010: mod RCA stenosis, severe diag stenosis, treated medically given lack of angina.   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 Episode in 2008 with spontaneous conversion, on amiodarone per EP.   S/P implantation of automatic cardioverter/defibrillator (AICD)    a. Medtronic, implanted 2012.   Sleep apnea    Stroke [facility]) 2004   Thyroid disease     Current Outpatient Medications on File Prior to Visit  Medication Sig Dispense Refill   acetaminophen (TYLENOL) 500 MG tablet Take 1,000 mg by  mouth every 6 (six) hours as needed for moderate pain or headache.     albuterol (PROVENTIL) (2.5 MG/3ML) 0.083% nebulizer solution Take 3 mLs (2.5 mg total) by nebulization every 6 (six) hours as needed for wheezing or shortness of breath. 150 mL 1   albuterol (VENTOLIN HFA) 108 (90 Base) MCG/ACT inhaler INHALE 2 PUFFS INTO THE LUNGS EVERY 6 HOURS AS NEEDED FOR WHEEZING OR SHORTNESS OF BREATH 8.5 g 6   alprazolam (XANAX) 2 MG tablet Take 2 mg by mouth every 6 (six) hours as needed for anxiety.     amiodarone (PACERONE) 200 MG tablet Take 1 tablet (200 mg total) by mouth daily. 180 tablet 3   atorvastatin (LIPITOR) 10 MG tablet Take 1 tablet (10 mg total) by mouth daily. 30 tablet 11   carvedilol (COREG) 12.5 MG tablet TAKE 1 TABLET(12.5 MG) BY MOUTH TWICE DAILY WITH A MEAL 180 tablet 3   dapagliflozin propanediol (FARXIGA) 10 MG TABS tablet Take 1 tablet (10 mg total) by mouth daily before breakfast. 30 tablet 5   Dextran 70-Hypromellose, PF, ([employer] NATURAL TEARS) 0.1-0.3 % SOLN Place 1 drop into both eyes daily as needed (dry eyes).     DULoxetine (CYMBALTA) 30 MG capsule Take 90 mg by mouth daily.      entecavir (BARACLUDE) 0.5 MG tablet Take 1 tablet (0.5 mg total) by mouth every other day. Please note: Every OTHER day 45 tablet 1   ezetimibe (ZETIA) 10 MG tablet TAKE 1 TABLET(10 MG) BY MOUTH DAILY 90 tablet 2   ferrous sulfate 325 (65 FE) MG tablet Take 325 mg by mouth daily with breakfast.     fluticasone (FLONASE) 50 MCG/ACT nasal spray SHAKE LIQUID AND USE 1 SPRAY IN EACH NOSTRIL DAILY 16 g 2   furosemide (LASIX) 40 MG tablet One tablet by mouth ( 40 mg) daily. 90 tablet 3   hydrALAZINE (APRESOLINE) 50 MG tablet TAKE 1 TABLET(50 MG) BY MOUTH TWICE DAILY 180 tablet 3   mometasone-formoterol (DULERA) 100-5 MCG/ACT AERO Inhale 2 puffs into the lungs 2 (two) times daily.     ondansetron (ZOFRAN-ODT) 8 MG disintegrating tablet Take 8 mg by mouth 2 (two) times daily as needed for nausea or vomiting.      oxybutynin (DITROPAN) 5 MG tablet Take 5 mg by mouth 3 (three) times daily.     polyethylene glycol (MIRALAX) 17 g packet Take as directed, Daily, titrate as needed 14 each 0   sacubitril-valsartan (ENTRESTO) 49-51 MG Take 1 tablet by mouth 2 (two) times daily. 60 tablet 11   sildenafil (REVATIO) 20 MG tablet Take 2-5 tablets by mouth once daily as needed prior to sexual activity. 100 tablet 3   spironolactone (ALDACTONE) 25 MG tablet Take 1 tablet (25 mg total) by mouth daily. 30 tablet 11  SYMBICORT 160-4.5 MCG/ACT inhaler Inhale 2 puffs into the lungs 2 (two) times daily. 10.2 g 2   XARELTO 20 MG TABS tablet TAKE 1 TABLET(20 MG) BY MOUTH DAILY WITH SUPPER 90 tablet 1   No current facility-administered medications on file prior to visit.    Allergies  Allergen Reactions   Prochlorperazine Other (See Comments)    Medication caused a lot of issues like blurred vision, nausea, pain, and carpool turnel   Codeine Itching, Nausea And Vomiting and Hives   Hydrocodone-Acetaminophen Itching and Nausea And Vomiting     Assessment/Plan:  1. CHF with LVEF 30-35% - BP above goal <130/56mmHg with room to optimize CHF meds. Checking BMET today with recent spironolactone start. If labs are stable, will increase Entresto to 97-103mg  BID and decrease Lasix to 20mg  daily. Pt aware I will call tomorrow with finalized med plan. For now, will continue Entresto 49-51mg  BID, carvedilol 12.5mg  BID (HR 50s), spironolactone 25mg  daily, Farxiga 10mg  daily, hydralazine 50mg  BID, and furosemide 40mg  daily. Will schedule f/u pending lab results.  2. Hyperlipidemia - LDL 138 in Jan 2022 above goal < 70 due to hx of CAD and stroke. Pt currently taking ezetimibe 10mg  daily. Atorvastatin 10mg  daily started at last visit 3 weeks ago (preferred statin given CKD). Discussed with Dr Havery Moros who was ok with statin rechallenge with close LFT monitoring given pt dx of Hepatitis B and cirrhosis. Follow up Hep B viral loads  have been undetectable on entecavir therapy. Checking LFTs today.  3. DM screening - Checking A1c today as this hasn't been done in 2 years and pt reports drinking 6+ cans of regular Coke per day which he was strongly advised to decrease/discontinue.  Zarion Oliff E. Skylee Baird, PharmD, BCACP, [place] [address], [place], [place] [address] Phone: [phone]; Fax: [phone] 12/09/2020 8:03 AM

## 2020-12-09 NOTE — Patient Instructions (Addendum)
Your blood pressure goal is < 130/51mmHg  We will check your labs today and if everything is stable, plan to increase your Entresto to 97-103mg  twice daily. I'll give you a call tomorrow to discuss this.  For now, continue taking the medications for your heart and blood pressure: Carvedilol 12.5mg  - 1 tablet twice daily Entresto 49-51mg  - 1 tablet twice daily Farxiga 10mg  - 1 tablet once daily Spironolactone 25mg  - 1 tablet once daily Hydralazine 50mg  - 1 tablet twice daily Furosemide 40mg  - 1 tablet once daily

## 2020-12-10 ENCOUNTER — Telehealth: Payer: Self-pay | Admitting: Pharmacist

## 2020-12-10 LAB — COMPREHENSIVE METABOLIC PANEL
ALT: 7 IU/L (ref 0–44)
AST: 13 IU/L (ref 0–40)
Albumin/Globulin Ratio: 1.6 (ref 1.2–2.2)
Albumin: 4.2 g/dL (ref 3.8–4.9)
Alkaline Phosphatase: 75 IU/L (ref 44–121)
BUN/Creatinine Ratio: 13 (ref 9–20)
BUN: 33 mg/dL — ABNORMAL HIGH (ref 6–24)
Bilirubin Total: 0.4 mg/dL (ref 0.0–1.2)
CO2: 22 mmol/L (ref 20–29)
Calcium: 9.3 mg/dL (ref 8.7–10.2)
Chloride: 99 mmol/L (ref 96–106)
Creatinine, Ser: 2.49 mg/dL — ABNORMAL HIGH (ref 0.76–1.27)
Globulin, Total: 2.7 g/dL (ref 1.5–4.5)
Glucose: 85 mg/dL (ref 65–99)
Potassium: 4.6 mmol/L (ref 3.5–5.2)
Sodium: 139 mmol/L (ref 134–144)
Total Protein: 6.9 g/dL (ref 6.0–8.5)
eGFR: 29 mL/min/{1.73_m2} — ABNORMAL LOW (ref >59)

## 2020-12-10 LAB — HEMOGLOBIN A1C
Est. average glucose Bld gHb Est-mCnc: 114 mg/dL
Hgb A1c MFr Bld: 5.6 % (ref 4.8–5.6)

## 2020-12-10 MED ORDER — HYDRALAZINE HCL 50 MG PO TABS: 50.0000 mg | ORAL_TABLET | Freq: Three times a day (TID) | ORAL | 3 refills | Status: DC

## 2020-12-10 NOTE — Procedures (Signed)
PATIENT'S NAME:  Cody Ortega, Cody Ortega DOB:      Apr 22, 1961      MR#:    [id number]     DATE OF RECORDING: 11/26/2020 REFERRING M.D.:  My Margret Chance, [place]  Study Performed:  Split-Night Titration Study  HISTORY: 60 year old man with a history of coronary artery disease, cardiomyopathy, chronic systolic congestive heart failure, atrial fibrillation, status post AICD implant in 2012 with generator change out in September 2021, chronic kidney disease, history of hepatitis B, history of substance use, history of liver cirrhosis anxiety, arthritis, asthma, anemia, stroke, thyroid disease, hypertension, hyperlipidemia, sleep apnea, not on CPAP therapy, and obesity, who reports recurrent headaches in the past few months. He has a history of obstructive sleep apnea. He tried CPAP and could not tolerate it at the time. The patient endorsed the Epworth Sleepiness Scale at 7 points. The patient's weight 278 pounds with a height of 75 (inches), resulting in a BMI of 34.5 kg/m2. The patient's neck circumference measured 18 inches.  CURRENT MEDICATIONS: Ventolin HFA, Xanax, Pacerone, Norvasc, Coreg, Farxiga, Cymbalta, Baraclude, Zetia, Ferrous Sulfate, Flonase, Lasix, Apresonline, Olopatadine, Zofran-ODT, Ditropan, Miralax, Entresto, Symbicort, Revatio, Flomax, Xarelto, Tears Naturale Free   PROCEDURE:  This is a multichannel digital polysomnogram utilizing the Somnostar 11.2 system.  Electrodes and sensors were applied and monitored per AASM Specifications.   EEG, EOG, Chin and Limb EMG, were sampled at 200 Hz.  ECG, Snore and Nasal Pressure, Thermal Airflow, Respiratory Effort, CPAP Flow and Pressure, Oximetry was sampled at 50 Hz. Digital video and audio were recorded.      BASELINE STUDY WITHOUT CPAP RESULTS:  Lights Out was at 22:17 and Lights On at 05:35 for the night, split start (due to meeting emergency split criteria) at 01:48, epoch 435. Total recording time (TRT) was 205, with a total sleep time (TST) of 137  minutes.   The patient's sleep latency was 47.5 minutes.  REM sleep was absent. The sleep efficiency was 66.8 %.    SLEEP ARCHITECTURE: WASO (Wake after sleep onset) was 42.5 minutes, with moderate to severe sleep fragmentation noted. Stage N1 was 86 minutes, Stage N2 was 51 minutes, Stage N3 was 0 minutes and Stage R (REM sleep) was 0 minutes.  The percentages were Stage N1 62.8%, which is markedly increased, Stage N2 37.2%, Stage N3 and Stage R (REM sleep) were absent.   RESPIRATORY ANALYSIS:  There were a total of 135 respiratory events:  59 obstructive apneas, 0 central apneas and 0 mixed apneas with a total of 59 apneas and an apnea index (AI) of 25.8. There were 76 hypopneas with a hypopnea index of 33.3. The patient also had 0 respiratory event related arousals (RERAs).      The total APNEA/HYPOPNEA INDEX (AHI) was 59.1/hour and the total RESPIRATORY DISTURBANCE INDEX was 59.1 /hour.  0 events occurred in REM sleep and 152 events in NREM. The REM AHI was 0 /hour versus a non-REM AHI of 59.1 /hour. The patient spent 143 minutes sleep time in the supine position 171 minutes in non-supine. The supine AHI was 62.9 /hour versus a non-supine AHI of 40.0 /hour.  OXYGEN SATURATION & C02:  The wake baseline 02 saturation was 92%, with the lowest being 75%. Time spent below 89% saturation equaled 131 minutes.  PERIODIC LIMB MOVEMENTS: The patient had a total of 0 Periodic Limb Movements.  The Periodic Limb Movement (PLM) index was 0 /hour and the PLM Arousal index was 0 /hour. The arousals were noted as: 42 were  spontaneous, 0 were associated with PLMs, 72 were associated with respiratory events.  Audio and video analysis did not show any abnormal or unusual movements, behaviors, phonations or vocalizations. The patient took 4 bathroom breaks. Mild to moderate snoring was noted. The EKG was in keeping with normal sinus rhythm (NSR) with pacer artifact noted.   TITRATION STUDY WITH CPAP RESULTS:   The  patient was fitted with a medium P10 nasal pillow interface, but was noted to have oral venting. He did not wish to try another mask. A chinstrap was applied. CPAP was initiated at 5 cmH20 with heated humidity per AASM split night standards and pressure was advanced to 9 cm H20 because of hypopneas, apneas and desaturations.  He was started on supplemental oxygen at 1 L/min when he was on CPAP of 7 cm and his AHI was adequate at 0/h, however, he had an average oxygen saturation in the lower 80s at the time.  He started having additional respiratory events when CPAP was increased to 9 cm, supplemental oxygen was discontinued on a CPAP pressure of 9 cm, final AHI was 16/h, O2 nadir 88%.  The patient reported to the acquisition technologist that he was using supplemental oxygen at home at 3 L/min.    Total recording time (TRT) was 227.5 minutes, with a total sleep time (TST) of 176.5 minutes. The patient's sleep latency was 35 minutes. REM latency was 0 minutes.  The sleep efficiency was 77.6 %.    SLEEP ARCHITECTURE: Wake after sleep was 30 minutes, Stage N1 40 minutes, Stage N2 120 minutes, Stage N3 16.5 minutes and Stage R (REM sleep) 0 minutes. The percentages were: Stage N1 22.7%, Stage N2 68.%, Stage N3 9.3% and Stage R (REM sleep) was absent. The arousals were noted as: 43 were spontaneous, 0 were associated with PLMs, 8 were associated with respiratory events.  RESPIRATORY ANALYSIS:  There were a total of 25 respiratory events: 0 obstructive apneas, 0 central apneas and 0 mixed apneas with a total of 0 apneas and an apnea index (AI) of 0. There were 25 hypopneas with a hypopnea index of 8.5 /hour. The patient also had 0 respiratory event related arousals (RERAs).      The total APNEA/HYPOPNEA INDEX  (AHI) was 8.5 /hour and the total RESPIRATORY DISTURBANCE INDEX was 8.5 /hour.  0 events occurred in REM sleep and 25 events in NREM. The REM AHI was 0 /hour versus a non-REM AHI of 8.5 /hour. REM sleep was  achieved on a pressure of  cm/h2o (AHI was  .) The patient spent 16% of total sleep time in the supine position. The supine AHI was 0.0 /hour, versus a non-supine AHI of 10.1/hour.  OXYGEN SATURATION & C02:  The wake baseline 02 saturation was 94%, with the lowest being 81%. Time spent below 89% saturation equaled 78 minutes.  PERIODIC LIMB MOVEMENTS:    The patient had a total of 93 Periodic Limb Movements. The Periodic Limb Movement (PLM) index was 31.6 /hour and the PLM Arousal index was 0 /hour.  Post-study, the patient indicated that sleep was better than usual.    POLYSOMNOGRAPHY IMPRESSION :  Severe obstructive Sleep Apnea (OSA)  Oxygen desaturation during sleep.   Dysfunctions associated with sleep stages or arousals from sleep Periodic Limb Movement Disorder (PLMD)  RECOMMENDATIONS: This patient has severe obstructive sleep apnea and responded to CPAP therapy but full resolution of his obstructive sleep disordered breathing was not achieved during this split-night study.  The absence of  REM sleep during the baseline portion of the study may underestimate his sleep disordered breathing in the absence of REM sleep during the titration portion of the study may result in suboptimal treatment with CPAP at home.  He also reports using supplemental oxygen at home.  He may or may not need supplemental oxygen with optimized positive airway pressure treatment eventually.  Nevertheless, for now, he will be advised to start home CPAP therapy at a pressure of 10 cm.  His home oxygen can be added to his CPAP treatment overnight.  The patient will be advised to be fully compliant with PAP therapy to improve sleep related symptoms and decrease long term cardiovascular risks. Please note that untreated obstructive sleep apnea carries additional perioperative morbidity. Patients with significant obstructive sleep apnea should receive perioperative PAP therapy and the surgeons and particularly the  anesthesiologist should be informed of the diagnosis and the severity of the sleep disordered breathing. 2. This study shows sleep fragmentation and abnormal sleep stage percentages; these are nonspecific findings and per se do not signify an intrinsic sleep disorder or a cause for the patient's sleep-related symptoms. Causes include (but are not limited to) the first night effect of the sleep study, circadian rhythm disturbances, medication effect or an underlying mood disorder or medical problem.  3. Moderate PLMs (periodic limb movements of sleep) were noted during this study with no significant arousals; clinical correlation is recommended. Medication effect from the antidepressant medication should be considered.  4. The patient should be cautioned not to drive, work at heights, or operate dangerous or heavy equipment when tired or sleepy. Review and reiteration of good sleep hygiene measures should be pursued with any patient. 5. The patient will be seen in follow-up in the sleep clinic at [facility] for discussion of the test results, symptom and treatment compliance review, further management strategies, etc. The referring provider will be notified of the test results.  I certify that I have reviewed the entire raw data recording prior to the issuance of this report in accordance with the Standards of Accreditation of the [place] [employer] (AASM)  Bartholomew Sohana Austell,MD,PhD [place] (Neurology and Sleep Medicine)

## 2020-12-10 NOTE — Addendum Note (Signed)
Addended by: Star Age on: 12/10/2020 03:43 PM   Modules accepted: Orders

## 2020-12-10 NOTE — Telephone Encounter (Signed)
Spoke with pt regarding lab results from yesterday.  SCr has increased since adding spironolactone, although his baseline SCr does range from 2-2.7. Will decrease furosemide from 40mg  to 20mg  daily and recheck BMET in 2 weeks. Will also increase hydralazine from 50mg  BID to TID for additional BP control. Ideally prefer to continue spironolactone for CHF benefit. Pt euvolemic on exam yesterday, weight stable and his Lisabeth Register and spironolactone should hopefully provide enough diuresis that we can continue to decrease/stop loop diuretic in the future. His A1c is on the upper end of the normal range, emphasized he needs to cut back on soda intake (drinking 6 regular Cokes a day). Pt aware of med changes and plan, follow scheduled in 2 weeks.

## 2020-12-11 ENCOUNTER — Ambulatory Visit: Payer: Medicaid Other | Attending: Internal Medicine | Admitting: Internal Medicine

## 2020-12-11 ENCOUNTER — Other Ambulatory Visit: Payer: Self-pay

## 2020-12-11 VITALS — BP 120/78 | HR 60 | Ht 75.0 in | Wt 276.0 lb

## 2020-12-11 DIAGNOSIS — Z79899 Other long term (current) drug therapy: Secondary | ICD-10-CM | POA: Diagnosis not present

## 2020-12-11 DIAGNOSIS — I5022 Chronic systolic (congestive) heart failure: Secondary | ICD-10-CM | POA: Diagnosis not present

## 2020-12-11 DIAGNOSIS — J449 Chronic obstructive pulmonary disease, unspecified: Secondary | ICD-10-CM | POA: Insufficient documentation

## 2020-12-11 DIAGNOSIS — N1832 Chronic kidney disease, stage 3b: Secondary | ICD-10-CM | POA: Diagnosis not present

## 2020-12-11 DIAGNOSIS — Z7984 Long term (current) use of oral hypoglycemic drugs: Secondary | ICD-10-CM | POA: Diagnosis not present

## 2020-12-11 DIAGNOSIS — G4733 Obstructive sleep apnea (adult) (pediatric): Secondary | ICD-10-CM | POA: Diagnosis not present

## 2020-12-11 DIAGNOSIS — D6869 Other thrombophilia: Secondary | ICD-10-CM | POA: Insufficient documentation

## 2020-12-11 DIAGNOSIS — Z7951 Long term (current) use of inhaled steroids: Secondary | ICD-10-CM | POA: Diagnosis not present

## 2020-12-11 DIAGNOSIS — I10 Essential (primary) hypertension: Secondary | ICD-10-CM | POA: Diagnosis not present

## 2020-12-11 DIAGNOSIS — I2510 Atherosclerotic heart disease of native coronary artery without angina pectoris: Secondary | ICD-10-CM | POA: Diagnosis not present

## 2020-12-11 DIAGNOSIS — Z7901 Long term (current) use of anticoagulants: Secondary | ICD-10-CM | POA: Diagnosis not present

## 2020-12-11 DIAGNOSIS — I130 Hypertensive heart and chronic kidney disease with heart failure and stage 1 through stage 4 chronic kidney disease, or unspecified chronic kidney disease: Secondary | ICD-10-CM | POA: Insufficient documentation

## 2020-12-11 DIAGNOSIS — I480 Paroxysmal atrial fibrillation: Secondary | ICD-10-CM | POA: Diagnosis not present

## 2020-12-11 DIAGNOSIS — J9611 Chronic respiratory failure with hypoxia: Secondary | ICD-10-CM | POA: Insufficient documentation

## 2020-12-11 DIAGNOSIS — N400 Enlarged prostate without lower urinary tract symptoms: Secondary | ICD-10-CM

## 2020-12-11 DIAGNOSIS — E7800 Pure hypercholesterolemia, unspecified: Secondary | ICD-10-CM | POA: Diagnosis not present

## 2020-12-11 DIAGNOSIS — G894 Chronic pain syndrome: Secondary | ICD-10-CM | POA: Insufficient documentation

## 2020-12-11 MED ORDER — OXYBUTYNIN CHLORIDE 5 MG PO TABS: 5.0000 mg | ORAL_TABLET | Freq: Three times a day (TID) | ORAL | 2 refills | Status: DC

## 2020-12-11 MED ORDER — TAMSULOSIN HCL 0.4 MG PO CAPS: 0.4000 mg | ORAL_CAPSULE | Freq: Every day | ORAL | 3 refills | Status: DC

## 2020-12-11 NOTE — Progress Notes (Signed)
Patient ID: KERRI ASCHE, male    DOB: [birth year]  MRN: [id number]  CC: Hypertension   Subjective: Cody Ortega is a 60 y.o. male who presents for chronic ds management His concerns today include:  The patient with history of COPD and asthma, HTN, CKD stage III, CAD, and NICM with chronic systolic CHF (EF 49-44% 12/6757) and ICD, PAF, immune active hep B, adenomatous polyps, dep/anx (followed by psychiatrist, Noemi Chapel), OSA (intolerant of CPAP mask).   Patient hospitalized in June of this year with acute hypoxic respiratory failure secondary to aspiration pneumonia and CHF exacerbation.  He was sent home on O2 3 L.  A few days prior to hospitalization he underwent direct-current cardioversion for symptomatic atrial fibrillation.   -Patient reports his breathing is better.  He uses the O2 after strenuous activities.  He also finds that he sleeps better with it at nights.  -He has been doing good with his Symbicort inhaler using it daily.  Uses albuterol a few times a week.  OSA: Had sleep study done 11/26/2020.  It was read by neurologist Dr. Rexene Alberts.  Found to have severe sleep apnea with oxygen desaturation during sleep and PLMD.  CPAP set to 10 cm of water pressure recommended and his home oxygen can be added to his CPAP treatment at nights.  Patient states he has not received any communication from the neurologist regarding the results of the sleep study or the need for CPAP.  HTN/CHF/a.fib: Reports compliance with his medications including carvedilol, furosemide, Xarelto, Entresto, Farxiga, amiodarone, spironolactone and hydralazine. -he limits salt in the foods.  He denies any chest pains or shortness of breath at this time.  No palpitations.  No lower extremity edema.  Reports blood pressure readings at home with # around 130/low 80s.  He denies any bruising or bleeding on the Xarelto.  CKD: Still making good urine.  He is followed by Dr. Candiss Norse at [facility].  He does not  recall his last visit.  GFR has remained in the range of 29-37.  His creatinine has been in the 2s.  Requests refill on Flomax for BPH. Patient Active Problem List   Diagnosis Date Noted   Secondary hypercoagulable state ([place]) 10/14/2020   Acute respiratory failure with hypoxia (HCC) 10/03/2020   Aspiration pneumonia of both lower lobes due to gastric secretions ([place]) 10/03/2020   Aspiration pneumonia ([place]) 10/03/2020   HFrEF (heart failure with reduced ejection fraction) ([place])    Stage 3b chronic kidney disease ([place]) 09/05/2019   BPH (benign prostatic hyperplasia) 06/19/2019   Dental abscess 04/30/2018   Adenomatous polyp of colon 02/09/2018   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 agent with hepatic coma 02/09/2018   Osteoarthritis of both knees 06/12/2017   COPD with asthma ([place]) 03/17/2016   Chronic seasonal allergic rhinitis 03/17/2016   GERD (gastroesophageal reflux disease) 03/17/2016   Chronic pain syndrome 03/16/2015   Obstructive sleep apnea 03/16/2015   Chronic low back pain with right-sided sciatica 03/16/2015   Sinus brady-tachy syndrome ([place]) 02/20/2014   Atrial fibrillation ([place]) 12/31/2013   Automatic implantable cardioverter-defibrillator in situ 03/08/2011   Erectile dysfunction 02/28/2011   CAD, NATIVE VESSEL 04/26/2010   PURE HYPERCHOLESTEROLEMIA 04/12/2010   HLD (hyperlipidemia) 01/11/2010   Essential hypertension, malignant 16/38/4665   Chronic systolic heart failure ([place]) 12/18/2009     Current Outpatient Medications on File Prior to Visit  Medication Sig Dispense Refill   acetaminophen (TYLENOL) 500 MG tablet Take 1,000 mg by mouth every 6 (  six) hours as needed for moderate pain or headache.     albuterol (PROVENTIL) (2.5 MG/3ML) 0.083% nebulizer solution Take 3 mLs (2.5 mg total) by nebulization every 6 (six) hours as needed for wheezing or shortness of breath. 150 mL 1   albuterol (VENTOLIN HFA) 108 (90 Base) MCG/ACT inhaler INHALE 2 PUFFS INTO THE LUNGS  EVERY 6 HOURS AS NEEDED FOR WHEEZING OR SHORTNESS OF BREATH 8.5 g 6   alprazolam (XANAX) 2 MG tablet Take 2 mg by mouth every 6 (six) hours as needed for anxiety.     amiodarone (PACERONE) 200 MG tablet Take 1 tablet (200 mg total) by mouth daily. 180 tablet 3   atorvastatin (LIPITOR) 10 MG tablet Take 1 tablet (10 mg total) by mouth daily. 30 tablet 11   carvedilol (COREG) 12.5 MG tablet TAKE 1 TABLET(12.5 MG) BY MOUTH TWICE DAILY WITH A MEAL 180 tablet 3   dapagliflozin propanediol (FARXIGA) 10 MG TABS tablet Take 1 tablet (10 mg total) by mouth daily before breakfast. 30 tablet 5   Dextran 70-Hypromellose, PF, ([employer] NATURAL TEARS) 0.1-0.3 % SOLN Place 1 drop into both eyes daily as needed (dry eyes).     DULoxetine (CYMBALTA) 30 MG capsule Take 90 mg by mouth daily.      entecavir (BARACLUDE) 0.5 MG tablet Take 1 tablet (0.5 mg total) by mouth every other day. Please note: Every OTHER day 45 tablet 1   ezetimibe (ZETIA) 10 MG tablet TAKE 1 TABLET(10 MG) BY MOUTH DAILY 90 tablet 2   ferrous sulfate 325 (65 FE) MG tablet Take 325 mg by mouth daily with breakfast.     fluticasone (FLONASE) 50 MCG/ACT nasal spray SHAKE LIQUID AND USE 1 SPRAY IN EACH NOSTRIL DAILY 16 g 2   furosemide (LASIX) 40 MG tablet One tablet by mouth ( 40 mg) daily. 90 tablet 3   hydrALAZINE (APRESOLINE) 50 MG tablet Take 1 tablet (50 mg total) by mouth 3 (three) times daily. 90 tablet 3   ondansetron (ZOFRAN-ODT) 8 MG disintegrating tablet Take 8 mg by mouth 2 (two) times daily as needed for nausea or vomiting.     polyethylene glycol (MIRALAX) 17 g packet Take as directed, Daily, titrate as needed 14 each 0   sacubitril-valsartan (ENTRESTO) 49-51 MG Take 1 tablet by mouth 2 (two) times daily. 60 tablet 11   sildenafil (REVATIO) 20 MG tablet Take 2-5 tablets by mouth once daily as needed prior to sexual activity. 100 tablet 3   spironolactone (ALDACTONE) 25 MG tablet Take 1 tablet (25 mg total) by mouth daily. 30 tablet 11    SYMBICORT 160-4.5 MCG/ACT inhaler Inhale 2 puffs into the lungs 2 (two) times daily. 10.2 g 2   XARELTO 20 MG TABS tablet TAKE 1 TABLET(20 MG) BY MOUTH DAILY WITH SUPPER 90 tablet 1   No current facility-administered medications on file prior to visit.    Allergies  Allergen Reactions   Prochlorperazine Other (See Comments)    Medication caused a lot of issues like blurred vision, nausea, pain, and carpool turnel   Codeine Itching, Nausea And Vomiting and Hives   Hydrocodone-Acetaminophen Itching and Nausea And Vomiting    Social History   Socioeconomic History   Marital status: Divorced    Spouse name: Not on file   Number of children: 2   Years of education: Not on file   Highest education level: Not on file  Occupational History   Occupation: Unemployed    Employer: UNEMPLOYED  Comment: basketball referee in the past  Tobacco Use   Smoking status: Former    Packs/day: 0.50    Years: 0.50    Pack years: 0.25    Types: Cigarettes    Quit date: 05/02/2010    Years since quitting: 10.6   Smokeless tobacco: Never   Tobacco comments:    Significant Second-hand and only 3 months himself  Vaping Use   Vaping Use: Never used  Substance and Sexual Activity   Alcohol use: Not Currently    Alcohol/week: 0.0 standard drinks    Comment: rare   Drug use: No    Comment: Reported history of cocaine abuse off and on    Sexual activity: Not on file  Other Topics Concern   Not on file  Social History Narrative   Living with a son who is a 5 year old.  He is not     working, unemployed for 3 years.  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  Denied use of alcohol.  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       [facility]:   Originally from [place], [place]. He grew up in [place]. He moved to [place] in 1985. 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 No pets currently. No bird exposure. He did have mold  in a previous home. No hot tub exposure.         Social Determinants of Health   Financial Resource Strain: Not on file  Food Insecurity: Not on file  Transportation Needs: Not on file  Physical Activity: Not on file  Stress: Not on file  Social Connections: Not on file  Intimate Partner Violence: Not on file    Family History  Problem Relation Age of Onset   Breast cancer Mother    Multiple myeloma Mother    Prostate cancer Father    Diabetes Father    Peripheral vascular disease Father    Cerebral palsy Daughter    Lung disease Neg Hx    Colon cancer Neg Hx    Stomach cancer Neg Hx    Esophageal cancer Neg Hx    Colon polyps Neg Hx     Past Surgical History:  Procedure Laterality Date   CARDIOVERSION N/A 01/07/2014   Procedure: CARDIOVERSION;  Surgeon: Lelon Perla, MD;  Location: [facility] ENDOSCOPY;  Service: Cardiovascular;  Laterality: N/A;   CARDIOVERSION N/A 02/11/2014   Procedure: CARDIOVERSION;  Surgeon: Pixie Casino, MD;  Location: [facility] ENDOSCOPY;  Service: Cardiovascular;  Laterality: N/A;   CARDIOVERSION N/A 10/01/2020   Procedure: CARDIOVERSION;  Surgeon: Werner Lean, MD;  Location: [facility] ENDOSCOPY;  Service: Cardiovascular;  Laterality: N/A;   COLONOSCOPY WITH PROPOFOL N/A 02/06/2018   Procedure: COLONOSCOPY WITH PROPOFOL;  Surgeon: Yetta Flock, MD;  Location: [facility] ENDOSCOPY;  Service: Gastroenterology;  Laterality: N/A;   ICD GENERATOR CHANGEOUT N/A 01/17/2020   Procedure: ICD GENERATOR CHANGEOUT;  Surgeon: Evans Lance, MD;  Location: [facility] LAB;  Service: Cardiovascular;  Laterality: N/A;   LASEK eye surgery Bilateral 05/2018   LASIK Bilateral    PACEMAKER INSERTION     POLYPECTOMY  02/06/2018   Procedure: POLYPECTOMY;  Surgeon: Yetta Flock, MD;  Location: [facility] ENDOSCOPY;  Service: Gastroenterology;;   TEE WITHOUT CARDIOVERSION N/A 01/07/2014   Procedure: TRANSESOPHAGEAL ECHOCARDIOGRAM (TEE);  Surgeon: Lelon Perla, MD;   Location: [facility];  Service: Cardiovascular;  Laterality: N/A;   TEE WITHOUT CARDIOVERSION N/A 02/11/2014   Procedure: TRANSESOPHAGEAL ECHOCARDIOGRAM (TEE);  Surgeon: Pixie Casino, MD;  Location: [facility] ENDOSCOPY;  Service: Cardiovascular;  Laterality: N/A;   TEE WITHOUT CARDIOVERSION N/A 10/01/2020   Procedure: TRANSESOPHAGEAL ECHOCARDIOGRAM (TEE);  Surgeon: Werner Lean, MD;  Location[facility]COPY;  Service: Cardiovascular;  Laterality: N/A;   TONSILLECTOMY     TRANSTHORACIC ECHOCARDIOGRAM  10/2006, 12/2009    ROS: Review of Systems Negative except as stated above  PHYSICAL EXAM: BP 120/78   Pulse 60   Ht $R'6\' 3"'gd$  (1.905 m)   Wt 276 lb (125.2 kg)   SpO2 96%   BMI 34.50 kg/m   Physical Exam   General appearance - alert, well appearing, and in no distress Mental status - normal mood, behavior, speech, dress, motor activity, and thought processes Neck - supple, no significant adenopathy Chest - clear to auscultation, no wheezes, rales or rhonchi, symmetric air entry Heart - normal rate, regular rhythm, normal S1, S2, no murmurs, rubs, clicks or gallops Extremities - peripheral pulses normal, no pedal edema, no clubbing or cyanosis  CMP Latest Ref Rng & Units 12/09/2020 11/12/2020 10/15/2020  Glucose 65 - 99 mg/dL 85 102(H) 113(H)  BUN 6 - 24 mg/dL 33(H) 35(H) 26(H)  Creatinine 0.76 - 1.27 mg/dL 2.49(H) 2.04(H) 2.11(H)  Sodium 134 - 144 mmol/L 139 143 139  Potassium 3.5 - 5.2 mmol/L 4.6 4.3 3.6  Chloride 96 - 106 mmol/L 99 101 99  CO2 20 - 29 mmol/L $RemoveB'22 24 23  '[id number]$ Calcium 8.7 - 10.2 mg/dL 9.3 9.4 9.6  Total Protein 6.0 - 8.5 g/dL 6.9 - 7.0  Total Bilirubin 0.0 - 1.2 mg/dL 0.4 - 0.3  Alkaline Phos 44 - 121 IU/L 75 - 96  AST 0 - 40 IU/L 13 - 11  ALT 0 - 44 IU/L 7 - 13   Lipid Panel     Component Value Date/Time   CHOL 196 05/08/2020 1332   TRIG 135 05/08/2020 1332   HDL 33 (L) 05/08/2020 1332   CHOLHDL 5.9 (H) 05/08/2020 1332   CHOLHDL 3.9 10/12/2015 1026   VLDL 27  10/12/2015 1026   LDLCALC 138 (H) 05/08/2020 1332   LDLDIRECT 102 (H) 09/15/2020 1635    CBC    Component Value Date/Time   WBC 7.9 10/15/2020 1118   WBC 9.2 10/03/2020 0245   RBC 5.25 10/15/2020 1118   RBC 5.04 10/03/2020 0245   HGB 16.0 10/15/2020 1118   HCT 48.4 10/15/2020 1118   PLT 272 10/15/2020 1118   MCV 92 10/15/2020 1118   MCH 30.5 10/15/2020 1118   MCH 30.8 10/03/2020 0245   MCHC 33.1 10/15/2020 1118   MCHC 32.8 10/03/2020 0245   RDW 13.6 10/15/2020 1118   LYMPHSABS 0.8 10/15/2020 1118   MONOABS 820 01/26/2016 1025   EOSABS 0.1 10/15/2020 1118   BASOSABS 0.0 10/15/2020 1118    ASSESSMENT AND PLAN: 1. OSA (obstructive sleep apnea) Good sleep hygiene discussed and encouraged.  Advised to avoid heavy sedatives and excessive alcohol use around bedtime. -I have written a prescription for his CPAP machine and supplies.  I will have my [place] fax it to [facility] or one of the other medical suppliers - For home use only DME continuous positive airway pressure (CPAP)  2. Essential hypertension At goal.  Continue current medications and low-salt diet.  3. Chronic systolic heart failure (HCC) Compensated.  Continue guideline directed medications as instructed by cardiology.  4. Stage 3b chronic  kidney disease ([place]) Stable though last GFR was lower than it has been.  Has a component of cardiac renal syndrome  5. PAF (paroxysmal atrial fibrillation) ([place]) Followed by cardiology.  Tolerating Xarelto without significant bruising or bleeding.  Advised to report any eating or excessive bruising  6. Benign prostatic hyperplasia without lower urinary tract symptoms - tamsulosin (FLOMAX) 0.4 MG CAPS capsule; Take 1 capsule (0.4 mg total) by mouth daily.  Dispense: 30 capsule; Refill: 3  7. Chronic hypoxemic respiratory failure (HCC) Continue O2 at nights as he does have documented desaturations on recent sleep study.  8.  COPD with asthma -stable on Symbicort.   Patient was  given the opportunity to ask questions.  Patient verbalized understanding of the plan and was able to repeat key elements of the plan.   Orders Placed This Encounter  Procedures   For home use only DME continuous positive airway pressure (CPAP)      Requested Prescriptions   Signed Prescriptions Disp Refills   oxybutynin (DITROPAN) 5 MG tablet 90 tablet 2    Sig: Take 1 tablet (5 mg total) by mouth 3 (three) times daily.   tamsulosin (FLOMAX) 0.4 MG CAPS capsule 30 capsule 3    Sig: Take 1 capsule (0.4 mg total) by mouth daily.    Return in about 4 months (around 04/12/2021).  Karle Plumber, MD, FACP

## 2020-12-12 DIAGNOSIS — J9611 Chronic respiratory failure with hypoxia: Secondary | ICD-10-CM | POA: Insufficient documentation

## 2020-12-15 ENCOUNTER — Telehealth: Payer: Self-pay

## 2020-12-15 NOTE — Telephone Encounter (Signed)
I called patient to discuss. No answer, left a voicemail asking him to call me back. 

## 2020-12-15 NOTE — Telephone Encounter (Signed)
I called pt. I advised pt that Dr. Rexene Alberts reviewed their sleep study results and found that pt has severe osa. Dr. Rexene Alberts recommends that pt start a cpap at home with his supplemental O2 bled in. I reviewed PAP compliance expectations with the pt. Pt is agreeable to starting a CPAP. I advised pt that an order will be sent to a DME, [facility], and [facility] will call the pt within about one week after they file with the pt's insurance. [facility] will show the pt how to use the machine, fit for masks, and troubleshoot the CPAP if needed. A follow up appt was made for insurance purposes with Amy, NP on 04/08/2021 at 2:30pm. Pt verbalized understanding to arrive 15 minutes early and bring their CPAP. A letter with all of this information in it will be mailed to the pt as a reminder. I verified with the pt that the address we have on file is correct. Pt verbalized understanding of results. Pt had no questions at this time but was encouraged to call back if questions arise. I have sent the order to [facility] and have received confirmation that they have received the order.

## 2020-12-15 NOTE — Telephone Encounter (Signed)
-----   Message from Star Age, MD sent at 12/10/2020  3:42 PM EDT ----- Patient referred by Dr. Marin Comment in optometry, seen by me on 09/15/20, split night sleep study on 11/26/20. Please call and notify patient that the recent sleep study confirmed the diagnosis of severe OSA. He did fairly well with CPAP therapy, there was significant reduction in his sleep apnea but he did not sleep all that well.  He did not achieve any dream sleep.  He did have oxygen drops during sleep.  He reported that he was using supplemental oxygen at home.  I would like for him to start CPAP therapy at a pressure of 10 cm.  He can use his supplemental oxygen with it.  The DME provider can show him how to connect the oxygen to the CPAP.  He is advised that once he has his CPAP machine that he will need a follow-up appointment within 31 to 89 days of starting treatment.  We may have to bring him back eventually for a second study to optimize his treatment settings including his pressure and to see if he still needs supplemental oxygen at night when he uses optimize CPAP therapy.  For now, we will start him on CPAP at home and see him in follow-up.  Treating severe sleep apnea may improve his headaches.  Please re-enforce the importance of compliance with treatment and the need for [place] to monitor compliance data - again an insurance requirement and usually a good feedback for the patient as far as how they are doing.  Also remind patient, that any PAP machine or mask issues should be first addressed with the DME company, who provided the machine/mask.  Please ask if patient has a preference regarding DME company, may depend on the insurance too.  Please arrange for CPAP set up at home through a DME company of patient's choice.  Once you have spoken to the patient you can close the phone encounter. Please fax/route report to referring provider, thanks,   Star Age, MD, PhD [facility])

## 2020-12-24 ENCOUNTER — Ambulatory Visit (INDEPENDENT_AMBULATORY_CARE_PROVIDER_SITE_OTHER): Payer: Medicaid Other | Admitting: Pharmacist

## 2020-12-24 ENCOUNTER — Telehealth: Payer: Self-pay

## 2020-12-24 ENCOUNTER — Other Ambulatory Visit: Payer: Self-pay

## 2020-12-24 VITALS — BP 142/90 | HR 61 | Wt 275.0 lb

## 2020-12-24 DIAGNOSIS — I5022 Chronic systolic (congestive) heart failure: Secondary | ICD-10-CM

## 2020-12-24 DIAGNOSIS — I10 Essential (primary) hypertension: Secondary | ICD-10-CM

## 2020-12-24 NOTE — Progress Notes (Signed)
Patient ID: SAABIR BLYTH                 DOB: 10/08/1960                      MRN: [id number]     HPI: Cody Ortega is a 60 y.o. male referred by Dr. Burt Knack to pharmacy clinic for HF medication management. 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 He was seen most recently by Dr Burt Knack on 11/04/20. Amlodipine was decreased to 5mg  with eventual goal to stop, and Entresto 49-51mg  BID was started. F/u BMET stable. I saw pt on 7/21 and stopped amlodipine and started spironolactone 25mg  daily for CHF. All of his home medications were consolidated as well.  At most recent visit with me on 8/10, BP was 140/90. Hydralazine was increased from 50mg  BID to TID and furosemide was decreased from 40mg  to 20mg  daily due to increase in SCr.   Pt presents today in good spirits. Reports tolerating medications well. Denies headache, dizziness, and LE edema. Using nasal cannula to sleep at night which has improved his sleep quality quite a bit. Has had balance issues for years. Home BP 130-140s/80-90s. Has cut back from 6 to 3 regular sodas per day, does not like diet soda.  Has Medicaid insurance, branded prescription copays are affordable at $3.   Current CHF meds:  Carvedilol 12.5mg  BID Entresto 49-51mg  BID Farxiga 10mg  daily Spironolactone 25mg  daily Hydralazine 50mg  TID (every 7 hours) Furosemide 20mg  daily  BP goal: <130/18mmHg  Family History: Breast cancer in his mother; Cerebral palsy in his daughter; Diabetes in his father; Multiple myeloma in his mother; Peripheral vascular disease in his father; Prostate cancer in his father. There is no  history of Lung disease, Colon cancer, Stomach cancer, Esophageal cancer, or Colon polyps  Social History: Former tobacco abuse 1/2 PPD, quit in 2012. On and off cocaine abuse. Denies alcohol use.  Diet: Saltine crackers, peanut butter sandwich, in the evening likes chicken skewers, [place], brisket etc  Exercise: Bad back and knees, plans to use the pool more  Wt Readings from Last 3 Encounters:  12/11/20 276 lb (125.2 kg)  12/09/20 274 lb (124.3 kg)  11/19/20 275 lb (124.7 kg)   BP Readings from Last 3 Encounters:  12/11/20 120/78  12/09/20 140/90  11/19/20 136/84   Pulse Readings from Last 3 Encounters:  12/11/20 60  12/09/20 (!) 56  11/19/20 (!) 53    Renal function: Estimated Creatinine Clearance: 46.1 mL/min (A) (by C-G formula based on SCr of 2.49 mg/dL (H)).  Past Medical History:  Diagnosis Date   Anxiety    Arthritis    Benzodiazepine dependence (HCC)    Bronchial asthma    CAD (coronary artery disease)    a. Cath 03/2010: mod RCA stenosis, severe diag stenosis, treated medically given lack of angina.   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 Episode in 2008 with spontaneous conversion, on amiodarone per EP.   S/P implantation of automatic cardioverter/defibrillator (AICD)    a. Medtronic, implanted 2012.   Sleep apnea    Stroke [facility]) 2004   Thyroid disease     Current Outpatient Medications on File Prior to Visit  Medication Sig Dispense Refill   acetaminophen (TYLENOL) 500 MG tablet Take 1,000 mg by mouth every 6 (six)  hours as needed for moderate pain or headache.     albuterol (PROVENTIL) (2.5 MG/3ML) 0.083% nebulizer solution Take 3 mLs (2.5 mg total) by nebulization every 6 (six) hours as needed for wheezing or shortness of breath. 150 mL 1   albuterol (VENTOLIN HFA) 108 (90 Base) MCG/ACT inhaler INHALE 2 PUFFS INTO THE LUNGS EVERY 6 HOURS AS NEEDED FOR WHEEZING OR SHORTNESS OF BREATH 8.5 g 6   alprazolam (XANAX) 2 MG tablet Take 2 mg by mouth every 6 (six) hours as needed for anxiety.     amiodarone (PACERONE) 200 MG tablet Take 1 tablet (200 mg total) by mouth daily. 180 tablet 3   atorvastatin (LIPITOR) 10 MG tablet Take 1 tablet (10 mg total) by mouth daily. 30 tablet 11   carvedilol (COREG) 12.5 MG tablet TAKE 1 TABLET(12.5 MG) BY MOUTH TWICE DAILY WITH A MEAL 180 tablet 3   dapagliflozin propanediol (FARXIGA) 10 MG TABS tablet Take 1 tablet (10 mg total) by mouth daily before breakfast. 30 tablet 5   Dextran 70-Hypromellose, PF, ([facility] NATURAL TEARS) 0.1-0.3 % SOLN Place 1 drop into both eyes daily as needed (dry eyes).     DULoxetine (CYMBALTA) 30 MG capsule Take 90 mg by mouth daily.      entecavir (BARACLUDE) 0.5 MG tablet Take 1 tablet (0.5 mg total) by mouth every other day. Please note: Every OTHER day 45 tablet 1   ezetimibe (ZETIA) 10 MG tablet TAKE 1 TABLET(10 MG) BY MOUTH DAILY 90 tablet 2   ferrous sulfate 325 (65 FE) MG tablet Take 325 mg by mouth daily with breakfast.     fluticasone (FLONASE) 50 MCG/ACT nasal spray SHAKE LIQUID AND USE 1 SPRAY IN EACH NOSTRIL DAILY 16 g 2   furosemide (LASIX) 40 MG tablet One tablet by mouth ( 40 mg) daily. 90 tablet 3   hydrALAZINE (APRESOLINE) 50 MG tablet Take 1 tablet (50 mg total) by mouth 3 (three) times daily. 90 tablet 3   ondansetron (ZOFRAN-ODT) 8 MG disintegrating tablet Take 8 mg by mouth 2 (two) times daily as needed for nausea or vomiting.     oxybutynin (DITROPAN) 5 MG tablet Take 1 tablet (5 mg total) by mouth 3 (three) times daily. 90 tablet  2   polyethylene glycol (MIRALAX) 17 g packet Take as directed, Daily, titrate as needed 14 each 0   sacubitril-valsartan (ENTRESTO) 49-51 MG Take 1 tablet by mouth 2 (two) times daily. 60 tablet 11   sildenafil (REVATIO) 20 MG tablet Take 2-5 tablets by mouth once daily as needed prior to sexual activity. 100 tablet 3   spironolactone (ALDACTONE) 25 MG tablet Take 1 tablet (25 mg total) by mouth daily. 30 tablet 11   SYMBICORT 160-4.5 MCG/ACT inhaler Inhale 2 puffs into the lungs 2 (two) times daily. 10.2  g 2   tamsulosin (FLOMAX) 0.4 MG CAPS capsule Take 1 capsule (0.4 mg total) by mouth daily. 30 capsule 3   XARELTO 20 MG TABS tablet TAKE 1 TABLET(20 MG) BY MOUTH DAILY WITH SUPPER 90 tablet 1   No current facility-administered medications on file prior to visit.    Allergies  Allergen Reactions   Prochlorperazine Other (See Comments)    Medication caused a lot of issues like blurred vision, nausea, pain, and carpool turnel   Codeine Itching, Nausea And Vomiting and Hives   Hydrocodone-Acetaminophen Itching and Nausea And Vomiting     Assessment/Plan:  1. CHF with LVEF 30-35% - BP above goal <130/57mmHg with room to optimize CHF meds. Checking BMET today. If labs are stable, will increase Entresto to 97-103mg  BID, otherwise will increase hydralazine from 50mg  TID to 100mg  TID. Pt aware he will be called tomorrow with finalized med plan. For now, will continue Entresto 49-51mg  BID, carvedilol 12.5mg  BID (HR 50s), spironolactone 25mg  daily, Farxiga 10mg  daily, hydralazine 50mg  TID, and furosemide 20mg  daily. Will schedule f/u pending lab results.  2. Hyperlipidemia - LDL 138 in Jan 2022 above goal < 70 due to hx of CAD and stroke. Pt currently taking ezetimibe 10mg  daily. Atorvastatin 10mg  daily started at last visit 3 weeks ago (preferred statin given CKD). Discussed with Dr Havery Moros who was ok with statin rechallenge with close LFT monitoring given pt dx of Hepatitis B and cirrhosis.  Follow up Hep B viral loads have been undetectable on entecavir therapy. LFTs normal at last visit.  3. DM screening - A1c checked at last visit and was 5.6. Discussed that pt is close to prediabetic range, he cut back from drinking 6 sodas per day down to 3. Congratulated pt on efforts and advised him to continue cutting back on soda intake. He does not like other seltzers or diet drinks.   Santosha Jividen E. Kodiak Rollyson, PharmD, BCACP, [place] [address], [place], [place] [address] Phone: [phone]; Fax: [phone] 12/24/2020 8:27 AM

## 2020-12-24 NOTE — Telephone Encounter (Signed)
-----   Message from Yevette Edwards, RN sent at 06/26/2020  9:49 AM EST ----- Regarding: Labs Repeat CMET, INR, AFP, and Hep B DNA. Orders in epic.

## 2020-12-24 NOTE — Telephone Encounter (Signed)
Left detailed message to remind him that he is due for repeat labs at this time. No appointment is necessary. Advised patient that he can stop by the lab in the basement at his convenience between 7:30 AM - 5 PM, Monday through Friday. Advised patient to give [place] a call back if he has any questions or concerns.

## 2020-12-24 NOTE — Patient Instructions (Addendum)
It was nice to see you today  Continue your current medications today  Depending on your lab results, we'll either increase the dose of your Entresto or hydralazine

## 2020-12-25 ENCOUNTER — Telehealth: Payer: Self-pay | Admitting: Pharmacist

## 2020-12-25 LAB — BASIC METABOLIC PANEL
BUN/Creatinine Ratio: 12 (ref 9–20)
BUN: 32 mg/dL — ABNORMAL HIGH (ref 6–24)
CO2: 24 mmol/L (ref 20–29)
Calcium: 9.7 mg/dL (ref 8.7–10.2)
Chloride: 100 mmol/L (ref 96–106)
Creatinine, Ser: 2.72 mg/dL — ABNORMAL HIGH (ref 0.76–1.27)
Glucose: 87 mg/dL (ref 65–99)
Potassium: 4.8 mmol/L (ref 3.5–5.2)
Sodium: 139 mmol/L (ref 134–144)
eGFR: 26 mL/min/{1.73_m2} — ABNORMAL LOW (ref >59)

## 2020-12-25 MED ORDER — HYDRALAZINE HCL 100 MG PO TABS: 100.0000 mg | ORAL_TABLET | Freq: Three times a day (TID) | ORAL | 3 refills | Status: DC

## 2020-12-25 NOTE — Telephone Encounter (Signed)
Scr increased again to 2.7. Will stop spironolactone. Increase hydralazine to 100mg  TID. Recheck labs in 1 week/pharmD apt. Keep eye on swelling.  LVM for patient to call back Could consider re-challenging with spironolactone 12.5mg  in the future.

## 2020-12-25 NOTE — Telephone Encounter (Signed)
Spoke with patient. Advised he stop spironolactone. Increase hydralazine to 100mg . Follow up in clinic for BP check and blood work 9/2

## 2020-12-30 DIAGNOSIS — J9601 Acute respiratory failure with hypoxia: Secondary | ICD-10-CM | POA: Diagnosis not present

## 2020-12-30 DIAGNOSIS — G4733 Obstructive sleep apnea (adult) (pediatric): Secondary | ICD-10-CM | POA: Diagnosis not present

## 2020-12-30 DIAGNOSIS — J690 Pneumonitis due to inhalation of food and vomit: Secondary | ICD-10-CM | POA: Diagnosis not present

## 2020-12-30 DIAGNOSIS — I5020 Unspecified systolic (congestive) heart failure: Secondary | ICD-10-CM | POA: Diagnosis not present

## 2020-12-31 DIAGNOSIS — I5020 Unspecified systolic (congestive) heart failure: Secondary | ICD-10-CM | POA: Diagnosis not present

## 2020-12-31 DIAGNOSIS — J690 Pneumonitis due to inhalation of food and vomit: Secondary | ICD-10-CM | POA: Diagnosis not present

## 2020-12-31 DIAGNOSIS — J9601 Acute respiratory failure with hypoxia: Secondary | ICD-10-CM | POA: Diagnosis not present

## 2021-01-01 ENCOUNTER — Other Ambulatory Visit: Payer: Self-pay

## 2021-01-01 ENCOUNTER — Ambulatory Visit (INDEPENDENT_AMBULATORY_CARE_PROVIDER_SITE_OTHER): Payer: Medicaid Other | Admitting: Pharmacist

## 2021-01-01 ENCOUNTER — Telehealth: Payer: Self-pay | Admitting: Pharmacist

## 2021-01-01 VITALS — BP 112/80 | HR 60 | Wt 276.0 lb

## 2021-01-01 DIAGNOSIS — I5022 Chronic systolic (congestive) heart failure: Secondary | ICD-10-CM | POA: Diagnosis not present

## 2021-01-01 DIAGNOSIS — I10 Essential (primary) hypertension: Secondary | ICD-10-CM | POA: Diagnosis not present

## 2021-01-01 LAB — BASIC METABOLIC PANEL
BUN/Creatinine Ratio: 14 (ref 9–20)
BUN: 40 mg/dL — ABNORMAL HIGH (ref 6–24)
CO2: 24 mmol/L (ref 20–29)
Calcium: 9 mg/dL (ref 8.7–10.2)
Chloride: 99 mmol/L (ref 96–106)
Creatinine, Ser: 2.95 mg/dL — ABNORMAL HIGH (ref 0.76–1.27)
Glucose: 91 mg/dL (ref 65–99)
Potassium: 3.8 mmol/L (ref 3.5–5.2)
Sodium: 139 mmol/L (ref 134–144)
eGFR: 24 mL/min/{1.73_m2} — ABNORMAL LOW (ref >59)

## 2021-01-01 MED ORDER — HYDRALAZINE HCL 50 MG PO TABS: 50.0000 mg | ORAL_TABLET | Freq: Three times a day (TID) | ORAL | 3 refills | Status: DC

## 2021-01-01 NOTE — Patient Instructions (Addendum)
It was nice to see you today  Your blood pressure is excellent and at goal  < 130/17mmHg  For now, continue taking the medications for your heart and blood pressure:  Carvedilol 12.5mg  - 1 tablet twice daily Entresto 49-51mg  - 1 tablet twice daily Farxiga 10mg  - 1 tablet once daily Hydralazine 50mg  - 1 tablet 3x daily Furosemide 40mg  - 1/2 tablet once daily

## 2021-01-01 NOTE — Progress Notes (Signed)
Patient ID: Cody Ortega                 DOB: Apr 16, 1961                      MRN: [id number]     HPI: Cody Ortega is a 60 y.o. male referred by Dr. Burt Knack to pharmacy clinic for HF medication management. 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 He was seen most recently by Dr Burt Knack on 11/04/20. Amlodipine was decreased to 5mg  with eventual goal to stop, and Entresto 49-51mg  BID was started. F/u BMET stable. I saw pt on 7/21 and stopped amlodipine and started spironolactone 25mg  daily for CHF. Unfortunately, SCr has increased from 2.04 > 2.49 > 2.72 over the past month even with reduction in furosemide dose, so last week spironolactone was stopped and hydralazine was increased to 100mg  TID.  Pt presents today in good spirits. Reports tolerating meds well. Was fitted for CPAP machine a few days ago. Denies LE edema, headaches, dizziness. Occasional SOB with exercise. Brings in all meds today - he has still been taking hydralazine 50mg . Did stop spironolactone as instructed. Unsure of furosemide dose he's been taking. He did write down 1/2 tab daily (correct instructions) on previous paper, but his pill bottle has a written note to take 3x/day, he also mentioned potentially taking it BID before feeling more confident he thinks he's been taking 1/2 tab daily. Checks BP 3x/day, many readings at goal at home. Did have an elevated reading last night of 157/98 before bed.  Has Medicaid insurance, branded prescription copays are affordable at $3.   Current CHF meds:  Carvedilol 12.5mg  BID Entresto 49-51mg  BID Farxiga 10mg  daily Hydralazine 50mg   TID (every 7 hours) Furosemide 20mg  daily  Previously tried: spironolactone 25mg  - SCr increase  BP goal: <130/22mmHg  Family History: Breast cancer in his mother; Cerebral palsy in his daughter; Diabetes in his father; Multiple myeloma in his mother; Peripheral vascular disease in his father; Prostate cancer in his father. There is no history of Lung disease, Colon cancer, Stomach cancer, Esophageal cancer, or Colon polyps  Social History: Former tobacco abuse 1/2 PPD, quit in 2012. On and off cocaine abuse. Denies alcohol use.  Diet: Saltine crackers, peanut butter sandwich, in the evening likes chicken skewers, [place], brisket etc  Exercise: Bad back and knees, plans to use the pool more  Wt Readings from Last 3 Encounters:  12/24/20 275 lb (124.7 kg)  12/11/20 276 lb (125.2 kg)  12/09/20 274 lb (124.3 kg)   BP Readings from Last 3 Encounters:  12/24/20 (!) 142/90  12/11/20 120/78  12/09/20 140/90   Pulse Readings from Last 3 Encounters:  12/24/20 61  12/11/20 60  12/09/20 (!) 56    Renal function: Estimated Creatinine Clearance: 42.1 mL/min (A) (by C-G formula based on SCr of 2.72 mg/dL (H)).  Past Medical History:  Diagnosis Date   Anxiety    Arthritis    Benzodiazepine dependence (HCC)    Bronchial asthma    CAD (coronary artery disease)    a. Cath 03/2010: mod RCA stenosis, severe diag stenosis, treated medically given lack of angina.   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 Episode in 2008 with spontaneous conversion, on  amiodarone per EP.   S/P implantation of automatic cardioverter/defibrillator (AICD)    a. Medtronic, implanted 2012.   Sleep apnea    Stroke [facility]) 2004   Thyroid disease     Current Outpatient Medications on File Prior to Visit  Medication Sig Dispense Refill   acetaminophen (TYLENOL) 500 MG tablet Take 1,000 mg by mouth every 6 (six) hours as needed for moderate pain or headache.     albuterol (PROVENTIL) (2.5 MG/3ML) 0.083% nebulizer solution Take 3 mLs (2.5 mg total) by nebulization every 6 (six) hours as needed for wheezing or shortness of breath. 150 mL 1   albuterol (VENTOLIN HFA) 108 (90 Base) MCG/ACT inhaler INHALE 2 PUFFS INTO THE LUNGS EVERY 6 HOURS AS NEEDED FOR WHEEZING OR SHORTNESS OF BREATH 8.5 g 6   alprazolam (XANAX) 2 MG tablet Take 2 mg by mouth every 6 (six) hours as needed for anxiety.     amiodarone (PACERONE) 200 MG tablet Take 1 tablet (200 mg total) by mouth daily. 180 tablet 3   atorvastatin (LIPITOR) 10 MG tablet Take 1 tablet (10 mg total) by mouth daily. 30 tablet 11   carvedilol (COREG) 12.5 MG tablet TAKE 1 TABLET(12.5 MG) BY MOUTH TWICE DAILY WITH A MEAL 180 tablet 3   dapagliflozin propanediol (FARXIGA) 10 MG TABS tablet Take 1 tablet (10 mg total) by mouth daily before breakfast. 30 tablet 5   Dextran 70-Hypromellose, PF, ([employer] NATURAL TEARS) 0.1-0.3 % SOLN Place 1 drop into both eyes daily as needed (dry eyes).     DULoxetine (CYMBALTA) 30 MG capsule Take 90 mg by mouth daily.      entecavir (BARACLUDE) 0.5 MG tablet Take 1 tablet (0.5 mg total) by mouth every other day. Please note: Every OTHER day 45 tablet 1   ezetimibe (ZETIA) 10 MG tablet TAKE 1 TABLET(10 MG) BY MOUTH DAILY 90 tablet 2   ferrous sulfate 325 (65 FE) MG tablet Take 325 mg by mouth daily with breakfast.     fluticasone (FLONASE) 50 MCG/ACT nasal spray SHAKE LIQUID AND USE 1 SPRAY IN EACH NOSTRIL DAILY 16 g 2   furosemide (LASIX) 40 MG tablet One tablet by mouth ( 40 mg) daily. 90 tablet 3    hydrALAZINE (APRESOLINE) 100 MG tablet Take 1 tablet (100 mg total) by mouth 3 (three) times daily. 270 tablet 3   ondansetron (ZOFRAN-ODT) 8 MG disintegrating tablet Take 8 mg by mouth 2 (two) times daily as needed for nausea or vomiting.     oxybutynin (DITROPAN) 5 MG tablet Take 1 tablet (5 mg total) by mouth 3 (three) times daily. 90 tablet 2   polyethylene glycol (MIRALAX) 17 g packet Take as directed, Daily, titrate as needed 14 each 0   sacubitril-valsartan (ENTRESTO) 49-51 MG Take 1 tablet by mouth 2 (two) times daily. 60 tablet 11   sildenafil (REVATIO) 20 MG tablet  Take 2-5 tablets by mouth once daily as needed prior to sexual activity. 100 tablet 3   SYMBICORT 160-4.5 MCG/ACT inhaler Inhale 2 puffs into the lungs 2 (two) times daily. 10.2 g 2   tamsulosin (FLOMAX) 0.4 MG CAPS capsule Take 1 capsule (0.4 mg total) by mouth daily. 30 capsule 3   XARELTO 20 MG TABS tablet TAKE 1 TABLET(20 MG) BY MOUTH DAILY WITH SUPPER 90 tablet 1   No current facility-administered medications on file prior to visit.    Allergies  Allergen Reactions   Prochlorperazine Other (See Comments)    Medication caused a lot of issues like blurred vision, nausea, pain, and carpool turnel   Codeine Itching, Nausea And Vomiting and Hives   Hydrocodone-Acetaminophen Itching and Nausea And Vomiting     Assessment/Plan:  1. CHF with LVEF 30-35% - BP notably improved and at goal <130/45mmHg. Checking BMET today after spironolactone was d/c'ed due to rise in SCr. If labs have stabilized, can increase Entresto to 97-103mg  BID and stop Lasix 20mg  daily. If not, BP is well controlled and will continue Entresto 49-51mg  BID, carvedilol 12.5mg  B ID (HR 60), Farxiga 10mg  daily, hydralazine 50mg  TID, and furosemide 20mg  daily. Pt will continue to monitor BP at home. Will schedule f/u if needed once labs result.  Salam Chesterfield E. Alexi Dorminey, PharmD, BCACP, [place] [address], [place], [place]  [address] Phone: [phone]; Fax: [phone] 01/01/2021 7:16 AM

## 2021-01-01 NOTE — Telephone Encounter (Signed)
SCr has continued to increase despite pt stopping spironolactone (initially started 7/21 and stopped 8/26). He has been on same dose of Entresto and Farxiga since at least February of this year so these meds shouldn't be contributing to increase in his kidney function over the past month despite spironolactone d/c. There was some confusion at his visit today over what dose of furosemide he has been taking at home (supposed to be taking 1/2 of a 40mg  tablet daily, but he wrote 1 tab TID on his bottle but didn't think he was actually taking that dose). Left message for pt to ask him to check his pill box to confirm how he has been taking furosemide for the past few weeks.

## 2021-01-03 ENCOUNTER — Other Ambulatory Visit: Payer: Self-pay | Admitting: Internal Medicine

## 2021-01-03 DIAGNOSIS — J449 Chronic obstructive pulmonary disease, unspecified: Secondary | ICD-10-CM

## 2021-01-05 NOTE — Telephone Encounter (Signed)
Requested medication (s) are due for refill today: yes  Requested medication (s) are on the active medication list:yes  Last refill: 10/05/20  10.2 g 2 refills  Future visit scheduled yes 04/02/21  Notes to clinic: Historical provider  Requested Prescriptions  Pending Prescriptions Disp Refills   SYMBICORT 160-4.5 MCG/ACT inhaler [Pharmacy Med Name: SYMBICORT 160/4.5MCG (120 ORAL INH)] 10.2 g 2    Sig: INHALE 2 PUFFS INTO THE LUNGS TWICE DAILY     Pulmonology:  Combination Products Passed - 01/03/2021  6:13 AM      Passed - Valid encounter within last 12 months    Recent Outpatient Visits           3 weeks ago OSA (obstructive sleep apnea)   [place], MD   2 months ago Essential hypertension   [place], [place], [place]   1 year ago Essential hypertension   [place], Deborah B, MD   1 year ago Class 2 severe obesity due to excess calories with serious comorbidity and body mass index (BMI) of 35.0 to 35.9 in adult [facility])   [place], MD   1 year ago Need for vaccination against Streptococcus pneumoniae using pneumococcal conjugate vaccine [place], RPH-CPP       Future Appointments             In 3 months Wynetta Emery, Dalbert Batman, MD [place]

## 2021-01-05 NOTE — Telephone Encounter (Signed)
Pt returned call and left voicemail, states he thinks he was taking hydralazine TID.  Called back and left another voicemail as he is confusing his meds. He is supposed to take hydralazine 3x a day, question is about his furosemide which he wrote down mismatched instructions for.

## 2021-01-06 ENCOUNTER — Telehealth: Payer: Self-pay

## 2021-01-06 ENCOUNTER — Other Ambulatory Visit: Payer: Self-pay

## 2021-01-06 DIAGNOSIS — B181 Chronic viral hepatitis B without delta-agent: Secondary | ICD-10-CM

## 2021-01-06 DIAGNOSIS — K7460 Unspecified cirrhosis of liver: Secondary | ICD-10-CM

## 2021-01-06 NOTE — Telephone Encounter (Signed)
-----   Message from Roetta Sessions, [place] sent at 07/16/2020  5:38 PM EDT ----- Regarding: due for [facility] screening Patient due for U/S of liver in Sept (around 9-16)

## 2021-01-06 NOTE — Telephone Encounter (Signed)
Patient due for [facility] screening in mid September.  Scheduled for Monday 9-19 at 9:00am, arr 8:45 am , NPO after midnight.  Letter mailed to patient

## 2021-01-06 NOTE — Telephone Encounter (Addendum)
Pt left another voicemail - states he was taking furosemide 40mg  TID by accident which likely explains his increase in SCr despite stopping spironolactone. Also states he has been cutting his hydralazine 50mg  tablets in half and taking 25mg  TID but that his refill is for 100mg  tablets.  He is aware to only take furosemide 20mg  daily. I called his pharmacy who states they filled hydralazine 100mg  on 8/26 which was 1 week before his dose was decreased to 50mg . Advised pt he should be taking 50mg  TID of hydralazine. Also scheduled BMET in 2 weeks to recheck kidney function on lower dose of his furosemide.

## 2021-01-07 NOTE — Progress Notes (Signed)
[facility] Note  01/13/2021     CHIEF COMPLAINT Patient presents for Retina Follow Up   HISTORY OF PRESENT ILLNESS: Cody Ortega is a 60 y.o. male who presents to the clinic today for:   HPI     Retina Follow Up   Patient presents with  PVD.  In left eye.  This started months ago.  Severity is moderate.  Duration of 3.5 months.  Since onset it is stable.  I, the attending physician,  performed the HPI with the patient and updated documentation appropriately.        Comments   60 y/o male pt here for 3 mo f/u for hemmorrhagic PVD OS.  No change in [place] OU noticed.  Got a glasses Rx from Dr. [name] about 1 mo ago, but says the glasses do not work for him, so he currently is not using correction.  Denies pain, FOL, floaters.  Refresh prn OU.      Last edited by Cody Caffey, MD on 01/17/2021  1:33 AM.    Pt states he has had no more episodes of vision loss since he was here last, his vision has been good   Referring physician: Marin Ortega [place], OD [place],  [place] [id number]  HISTORICAL INFORMATION:   Selected notes from the [place] Referred by Dr. [name] for decreased vision and floaters x3 days    CURRENT MEDICATIONS: Current Outpatient Medications (Ophthalmic Drugs)  Medication Sig   Dextran 70-Hypromellose, PF, ([employer] NATURAL TEARS) 0.1-0.3 % SOLN Place 1 drop into both eyes daily as needed (dry eyes).   No current facility-administered medications for this visit. (Ophthalmic Drugs)   Current Outpatient Medications (Other)  Medication Sig   acetaminophen (TYLENOL) 500 MG tablet Take 1,000 mg by mouth every 6 (six) hours as needed for moderate pain or headache.   albuterol (PROVENTIL) (2.5 MG/3ML) 0.083% nebulizer solution Take 3 mLs (2.5 mg total) by nebulization every 6 (six) hours as needed for wheezing or shortness of breath.   albuterol (VENTOLIN HFA) 108 (90 Base) MCG/ACT inhaler INHALE 2 PUFFS INTO THE  LUNGS EVERY 6 HOURS AS NEEDED FOR WHEEZING OR SHORTNESS OF BREATH   alprazolam (XANAX) 2 MG tablet Take 2 mg by mouth every 6 (six) hours as needed for anxiety.   amiodarone (PACERONE) 200 MG tablet Take 1 tablet (200 mg total) by mouth daily.   atorvastatin (LIPITOR) 10 MG tablet Take 1 tablet (10 mg total) by mouth daily.   carvedilol (COREG) 12.5 MG tablet TAKE 1 TABLET(12.5 MG) BY MOUTH TWICE DAILY WITH A MEAL   dapagliflozin propanediol (FARXIGA) 10 MG TABS tablet Take 1 tablet (10 mg total) by mouth daily before breakfast.   DULoxetine (CYMBALTA) 30 MG capsule Take 90 mg by mouth daily.    entecavir (BARACLUDE) 0.5 MG tablet Take 1 tablet (0.5 mg total) by mouth every other day. Please note: Every OTHER day   ezetimibe (ZETIA) 10 MG tablet TAKE 1 TABLET(10 MG) BY MOUTH DAILY   ferrous sulfate 325 (65 FE) MG tablet Take 325 mg by mouth daily with breakfast.   fluticasone (FLONASE) 50 MCG/ACT nasal spray SHAKE LIQUID AND USE 1 SPRAY IN EACH NOSTRIL DAILY   furosemide (LASIX) 40 MG tablet Take 20 mg by mouth daily.   hydrALAZINE (APRESOLINE) 50 MG tablet Take 1 tablet (50 mg total) by mouth 3 (three) times daily.   ondansetron (ZOFRAN-ODT) 8 MG disintegrating tablet  Take 8 mg by mouth 2 (two) times daily as needed for nausea or vomiting.   oxybutynin (DITROPAN) 5 MG tablet Take 1 tablet (5 mg total) by mouth 3 (three) times daily.   polyethylene glycol (MIRALAX) 17 g packet Take as directed, Daily, titrate as needed   sacubitril-valsartan (ENTRESTO) 49-51 MG Take 1 tablet by mouth 2 (two) times daily.   sildenafil (REVATIO) 20 MG tablet Take 2-5 tablets by mouth once daily as needed prior to sexual activity.   SYMBICORT 160-4.5 MCG/ACT inhaler INHALE 2 PUFFS INTO THE LUNGS TWICE DAILY   tamsulosin (FLOMAX) 0.4 MG CAPS capsule Take 1 capsule (0.4 mg total) by mouth daily.   XARELTO 20 MG TABS tablet TAKE 1 TABLET(20 MG) BY MOUTH DAILY WITH SUPPER   No current facility-administered medications  for this visit. (Other)    REVIEW OF SYSTEMS: ROS   Positive for: Gastrointestinal, Cardiovascular, Eyes, Respiratory Negative for: Constitutional, Neurological, Skin, Genitourinary, Musculoskeletal, HENT, Endocrine, Psychiatric, Allergic/Imm, Heme/Lymph Last edited by Matthew Folks, COA on 01/13/2021  1:29 PM.      ALLERGIES Allergies  Allergen Reactions   Prochlorperazine Other (See Comments)    Medication caused a lot of issues like blurred vision, nausea, pain, and carpool turnel   Codeine Itching, Nausea And Vomiting and Hives   Hydrocodone-Acetaminophen Itching and Nausea And Vomiting    PAST MEDICAL HISTORY Past Medical History:  Diagnosis Date   Anxiety    Arthritis    Benzodiazepine dependence ([place])    Bronchial asthma    CAD (coronary artery disease)    a. Cath 03/2010: mod RCA stenosis, severe diag stenosis, treated medically given lack of angina.   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 Episode in 2008 with spontaneous conversion, on amiodarone per EP.   S/P implantation of automatic cardioverter/defibrillator (AICD)    a. Medtronic, implanted 2012.   Sleep apnea    Stroke [facility]) 2004   Thyroid disease    Past Surgical History:  Procedure Laterality Date   CARDIOVERSION N/A 01/07/2014   Procedure: CARDIOVERSION;  Surgeon: Lelon Perla, MD;  Location[facility] ENDOSCOPY;  Service: Cardiovascular;  Laterality: N/A;   CARDIOVERSION N/A 02/11/2014   Procedure: CARDIOVERSION;  Surgeon: Pixie Casino,  MD;  Location: [place];  Service: Cardiovascular;  Laterality: N/A;   CARDIOVERSION N/A 10/01/2020   Procedure: CARDIOVERSION;  Surgeon: Werner Lean, MD;  Location: [place] ENDOSCOPY;  Service: Cardiovascular;  Laterality: N/A;   CATARACT EXTRACTION     COLONOSCOPY WITH PROPOFOL N/A 02/06/2018   Procedure: COLONOSCOPY WITH PROPOFOL;  Surgeon: Yetta Flock, MD;  Location: [facility] ENDOSCOPY;  Service: Gastroenterology;  Laterality: N/A;   EYE SURGERY     ICD GENERATOR CHANGEOUT N/A 01/17/2020   Procedure: ICD GENERATOR CHANGEOUT;  Surgeon: Evans Lance, MD;  Location: [place] CV LAB;  Service: Cardiovascular;  Laterality: N/A;   LASEK eye surgery Bilateral 05/2018  LASIK Bilateral    PACEMAKER INSERTION     POLYPECTOMY  02/06/2018   Procedure: POLYPECTOMY;  Surgeon: Yetta Flock, MD;  Location: [facility] ENDOSCOPY;  Service: Gastroenterology;;   TEE WITHOUT CARDIOVERSION N/A 01/07/2014   Procedure: TRANSESOPHAGEAL ECHOCARDIOGRAM (TEE);  Surgeon: Lelon Perla, MD;  Location: [facility] ENDOSCOPY;  Service: Cardiovascular;  Laterality: N/A;   TEE WITHOUT CARDIOVERSION N/A 02/11/2014   Procedure: TRANSESOPHAGEAL ECHOCARDIOGRAM (TEE);  Surgeon: Pixie Casino, MD;  Location: [facility] ENDOSCOPY;  Service: Cardiovascular;  Laterality: N/A;   TEE WITHOUT CARDIOVERSION N/A 10/01/2020   Procedure: TRANSESOPHAGEAL ECHOCARDIOGRAM (TEE);  Surgeon: Werner Lean, MD;  Location: [facility] ENDOSCOPY;  Service: Cardiovascular;  Laterality: N/A;   TONSILLECTOMY     TRANSTHORACIC ECHOCARDIOGRAM  10/2006, 12/2009    FAMILY HISTORY Family History  Problem Relation Age of Onset   Breast cancer Mother    Multiple myeloma Mother    Prostate cancer Father    Diabetes Father    Peripheral vascular disease Father    Cerebral palsy Daughter    Lung disease Neg Hx    Colon cancer Neg Hx    Stomach cancer Neg Hx    Esophageal cancer Neg Hx    Colon polyps Neg Hx     SOCIAL HISTORY Social  History   Tobacco Use   Smoking status: Former    Packs/day: 0.50    Years: 0.50    Pack years: 0.25    Types: Cigarettes    Quit date: 05/02/2010    Years since quitting: 10.7   Smokeless tobacco: Never   Tobacco comments:    Significant Second-hand and only 3 months himself  Vaping Use   Vaping Use: Never used  Substance Use Topics   Alcohol use: Not Currently    Alcohol/week: 0.0 standard drinks    Ortega: rare   Drug use: No    Ortega: Reported history of cocaine abuse off and on          OPHTHALMIC EXAM:  Base Eye Exam     Visual Acuity (Snellen - Linear)       Right Left   Dist [place] 20/30 -2 20/20   Dist ph [place] 20/25 -2          Tonometry (Tonopen, 1:31 PM)       Right Left   Pressure 15 15         Pupils       Dark Light Shape React APD   Right 3 2 Round Brisk None   Left 3 2 Round Brisk None         Visual Fields (Counting fingers)       Left Right    Full Full         Extraocular Movement       Right Left    Full, Ortho Full, Ortho         Neuro/Psych     Oriented x3: Yes   Mood/Affect: Normal         Dilation     Both eyes: 1.0% Mydriacyl, 2.5% Phenylephrine @ 1:31 PM           Slit Lamp and Fundus Exam     Slit Lamp Exam       Right Left   Lids/Lashes Dermatochalasis - upper lid, mild Meibomian gland dysfunction Dermatochalasis - upper lid, mild Meibomian gland dysfunction   Conjunctiva/Sclera White and quiet White and quiet   Cornea Mild arcus, well healed temporal cataract wounds,  mild tear film debris Mild Debris in tear film, well healed temporal cataract wounds, mild Arcus, trace Punctate epithelial erosions   Anterior Chamber Deep and quiet Deep and quiet   Iris Round and dilated Round and dilated   Lens PC IOL in good position, trace Posterior capsular opacification PC IOL in good position, trace Posterior capsular opacification   Vitreous Vitreous syneresis Vitreous syneresis, PVD; heme resolved          Fundus Exam       Right Left   Disc Inferior Pallor, vascular loops, Sharp rim Pink and Sharp, mild temporal Pallor   C/D Ratio 0.3 0.4   Macula Flat, Blunted foveal reflex, mild Retinal pigment epithelial mottling, inferior atrophy Flat, good foveal reflex, focal RPE disruption IN fovea, no heme or edema   Vessels Severe Vascular attenuation, superior hemisphere with telangectasias and perivascular exudation / sheathing -- stable, Copper wiring, plaques in ST arterioles, Tortuous, no NV, BRAO Vascular attenuation, mild Tortuousity, mild Copper wiring, mild AV crossing changes   Periphery Attached, no heme Attached, inferior lattice, peripheral cystoid degeneration, No RT/RD             IMAGING AND PROCEDURES  Imaging and Procedures for $RemoveBefore'@TODAY'[id number]$ @  OCT, Retina - OU - Both Eyes       Right Eye Quality was good. Central Foveal Thickness: 232. Progression has been stable. Findings include abnormal foveal contour, no IRF, no SRF (Severe inner retinal atrophy inferior macula - stable).   Left Eye Quality was good. Central Foveal Thickness: 232. Progression has been stable. Findings include normal foveal contour, no SRF, no IRF, retinal drusen (Focal ellipsoid disruption inferonasal fovea).   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 CME - cystoid macular edema. PED - pigment epithelial detachment. IRF - intraretinal fluid. SRF - subretinal fluid. EZ - ellipsoid zone. ERM - epiretinal membrane. ORA - outer retinal atrophy. ORT - outer retinal tubulation. SRHM - subretinal hyper-reflective material      Fluorescein Angiography Optos (Transit OD)       Right Eye Progression has been stable. Early phase findings include delayed filling, vascular perfusion defect.  Mid/Late phase findings include vascular perfusion defect (telangectasias superior macula, trace punctate hyper florescence temporal periphery).   Left Eye Progression has been stable. Early phase findings include staining. Mid/Late phase findings include staining (Hyperfluorescence of the disc, perifoveal staining inferior macula).   Notes **Images stored on drive**  Impression: OD: remote, inferior BRAO -- stable from prior OS: Hyperfluorescence of the disc, perifoveal staining inferior macula                ASSESSMENT/PLAN:    ICD-10-CM   1. Posterior vitreous detachment of left eye  H43.812     2. Retinal edema  H35.81 OCT, Retina - OU - Both Eyes    3. Vitreous hemorrhage of left eye (HCC)  H43.12     4. Retinal artery branch occlusion of right eye  H34.231     5. Essential hypertension  I10     6. Hypertensive retinopathy of both eyes  H35.033 Fluorescein Angiography Optos (Transit OD)    7. Pseudophakia of both eyes  Z96.1      1,2. Hemorrhagic PVD OS  - Onset late March 2021 -- presented to Dr. Jerilynn Mages. Le per pt  history  - Symptoms resolved, VH cleared  - Discussed findings and prognosis  - No RT or RD on 360 scleral depressed exam  - Reviewed s/s of RT/RD  - Strict return precautions for any such RT/RD signs/symptoms  3,4. History of BRAO OD -- stable  - inner retinal atrophy, inferior macula  - fovea spared with BCVA 20/25 OD  - f/u 6-9 months, DFE, OCT  5,6. Hypertensive retinopathy OU  - discussed importance of tight BP control  - monitor  7. Pseudophakia OU  - s/p CE/IOL OU  - beautiful surgery, doing well  - monitor  Ophthalmic Meds Ordered this visit:  No orders of the defined types were placed in this encounter.      Return for f/u 6-9 months, BRAO OD, DFE, OCT.  There are no Patient Instructions on file for this visit.   This document serves as a record of services personally performed by Gardiner Sleeper, MD, PhD. It was created on  their behalf by Orvan Falconer, an ophthalmic technician. The creation of this record is the provider's dictation and/or activities during the visit.    Electronically signed by: Orvan Falconer, OA, 01/17/21  1:37 AM   This document serves as a record of services personally performed by Gardiner Sleeper, MD, PhD. It was created on their behalf by San Jetty. Owens Shark, OA an ophthalmic technician. The creation of this record is the provider's dictation and/or activities during the visit.    Electronically signed by: San Jetty. Marguerita Merles 09.14.2022 1:37 AM  Gardiner Sleeper, M.D., Ph.D. Diseases & Surgery of the [facility] and [place] 01/13/2021   I have reviewed the above documentation for accuracy and completeness, and I agree with the above. Gardiner Sleeper, M.D., Ph.D. 01/17/21 1:41 AM   Abbreviations: M myopia (nearsighted); A astigmatism; H hyperopia (farsighted); P presbyopia; Mrx spectacle prescription;  CTL contact lenses; OD right eye; OS left eye; OU both eyes  XT exotropia; ET esotropia; PEK punctate epithelial keratitis; PEE punctate epithelial erosions; DES dry eye syndrome; MGD meibomian gland dysfunction; ATs artificial tears; PFAT's preservative free artificial tears; [place] nuclear sclerotic cataract; PSC posterior subcapsular cataract; ERM epi-retinal membrane; PVD posterior vitreous detachment; RD retinal detachment; DM diabetes mellitus; DR diabetic retinopathy; NPDR non-proliferative diabetic retinopathy; PDR proliferative diabetic retinopathy; CSME clinically significant macular edema; DME diabetic macular edema; dbh dot blot hemorrhages; CWS cotton wool spot; POAG primary open angle glaucoma; C/D cup-to-disc ratio; HVF humphrey visual field; GVF goldmann visual field; OCT optical coherence tomography; IOP intraocular pressure; BRVO Branch retinal vein occlusion; CRVO central retinal vein occlusion; CRAO central retinal artery occlusion; BRAO branch  retinal artery occlusion; RT retinal tear; SB scleral buckle; PPV pars plana vitrectomy; VH Vitreous hemorrhage; PRP panretinal laser photocoagulation; IVK intravitreal kenalog; VMT vitreomacular traction; MH Macular hole;  NVD neovascularization of the disc; NVE neovascularization elsewhere; AREDS age related eye disease study; ARMD age related macular degeneration; POAG primary open angle glaucoma; EBMD epithelial/anterior basement membrane dystrophy; ACIOL anterior chamber intraocular lens; IOL intraocular lens; PCIOL posterior chamber intraocular lens; Phaco/IOL phacoemulsification with intraocular lens placement; [place] photorefractive keratectomy; LASIK laser assisted in situ keratomileusis; HTN hypertension; DM diabetes mellitus; COPD chronic obstructive pulmonary disease

## 2021-01-13 ENCOUNTER — Other Ambulatory Visit: Payer: Self-pay

## 2021-01-13 ENCOUNTER — Ambulatory Visit (INDEPENDENT_AMBULATORY_CARE_PROVIDER_SITE_OTHER): Payer: Medicaid Other | Admitting: Ophthalmology

## 2021-01-13 ENCOUNTER — Encounter (INDEPENDENT_AMBULATORY_CARE_PROVIDER_SITE_OTHER): Payer: Self-pay | Admitting: Ophthalmology

## 2021-01-13 DIAGNOSIS — H35033 Hypertensive retinopathy, bilateral: Secondary | ICD-10-CM | POA: Diagnosis not present

## 2021-01-13 DIAGNOSIS — H3581 Retinal edema: Secondary | ICD-10-CM

## 2021-01-13 DIAGNOSIS — H43812 Vitreous degeneration, left eye: Secondary | ICD-10-CM

## 2021-01-13 DIAGNOSIS — H4312 Vitreous hemorrhage, left eye: Secondary | ICD-10-CM | POA: Diagnosis not present

## 2021-01-13 DIAGNOSIS — Z961 Presence of intraocular lens: Secondary | ICD-10-CM

## 2021-01-13 DIAGNOSIS — H34231 Retinal artery branch occlusion, right eye: Secondary | ICD-10-CM | POA: Diagnosis not present

## 2021-01-13 DIAGNOSIS — I10 Essential (primary) hypertension: Secondary | ICD-10-CM | POA: Diagnosis not present

## 2021-01-14 ENCOUNTER — Ambulatory Visit (INDEPENDENT_AMBULATORY_CARE_PROVIDER_SITE_OTHER): Payer: Medicaid Other

## 2021-01-14 DIAGNOSIS — I495 Sick sinus syndrome: Secondary | ICD-10-CM | POA: Diagnosis not present

## 2021-01-14 LAB — CUP PACEART INCLINIC DEVICE CHECK
Battery Remaining Longevity: 132 mo
Battery Voltage: 3.02 V
Brady Statistic RV Percent Paced: 0.35 %
Date Time Interrogation Session: 20220915141928
HighPow Impedance: 47 Ohm
HighPow Impedance: 65 Ohm
Implantable Lead Implant Date: 20120806
Implantable Lead Location: 753860
Implantable Lead Model: 185
Implantable Lead Serial Number: 345870
Implantable Pulse Generator Implant Date: 20210917
Lead Channel Impedance Value: 323 Ohm
Lead Channel Impedance Value: 342 Ohm
Lead Channel Pacing Threshold Amplitude: 1.25 V
Lead Channel Pacing Threshold Pulse Width: 0.4 ms
Lead Channel Sensing Intrinsic Amplitude: 11.25 mV
Lead Channel Setting Pacing Amplitude: 2.5 V
Lead Channel Setting Pacing Pulse Width: 0.4 ms
Lead Channel Setting Sensing Sensitivity: 0.3 mV

## 2021-01-14 NOTE — Progress Notes (Addendum)
ICD check in clinic. Normal device function. Thresholds and sensing consistent with previous device measurements. Impedance trends stable over time. No ventricular arrhythmias. Histogram distribution appropriate for patient and level of activity. Optivol is trending. No fluid retention symptoms noted. Patient asymptomatic/compliant with meds. Offered patient ICM clinic referral and patient agreeable. Message sent to [facility] clinic Rn ~ Margarita Grizzle to follow up. No changes made this session. Device programmed at appropriate safety margins. Estimated longevity 11 years. Pt enrolled in remote follow-up 04/15/21. Patient education completed.  Routing to Dr. Lovena Le for review.

## 2021-01-15 ENCOUNTER — Ambulatory Visit (INDEPENDENT_AMBULATORY_CARE_PROVIDER_SITE_OTHER): Payer: Medicaid Other

## 2021-01-15 DIAGNOSIS — I5022 Chronic systolic (congestive) heart failure: Secondary | ICD-10-CM

## 2021-01-17 ENCOUNTER — Encounter (INDEPENDENT_AMBULATORY_CARE_PROVIDER_SITE_OTHER): Payer: Self-pay | Admitting: Ophthalmology

## 2021-01-18 ENCOUNTER — Ambulatory Visit (HOSPITAL_COMMUNITY)
Admission: RE | Admit: 2021-01-18 | Discharge: 2021-01-18 | Disposition: A | Discharge status: Home / Self Care | Payer: Medicaid Other | Source: Ambulatory Visit | Attending: Gastroenterology | Admitting: Gastroenterology

## 2021-01-18 ENCOUNTER — Other Ambulatory Visit: Payer: Self-pay

## 2021-01-18 DIAGNOSIS — B181 Chronic viral hepatitis B without delta-agent: Secondary | ICD-10-CM | POA: Insufficient documentation

## 2021-01-18 DIAGNOSIS — K7460 Unspecified cirrhosis of liver: Secondary | ICD-10-CM | POA: Diagnosis present

## 2021-01-18 DIAGNOSIS — K7689 Other specified diseases of liver: Secondary | ICD-10-CM | POA: Diagnosis not present

## 2021-01-18 LAB — CUP PACEART REMOTE DEVICE CHECK
Battery Remaining Longevity: 132 mo
Battery Voltage: 3.03 V
Brady Statistic RV Percent Paced: 0.35 %
Date Time Interrogation Session: 20220915222112
HighPow Impedance: 42 Ohm
HighPow Impedance: 58 Ohm
Implantable Lead Implant Date: 20120806
Implantable Lead Location: 753860
Implantable Lead Model: 185
Implantable Lead Serial Number: 345870
Implantable Pulse Generator Implant Date: 20210917
Lead Channel Impedance Value: 323 Ohm
Lead Channel Impedance Value: 342 Ohm
Lead Channel Pacing Threshold Amplitude: 1.25 V
Lead Channel Pacing Threshold Pulse Width: 0.4 ms
Lead Channel Sensing Intrinsic Amplitude: 11.25 mV
Lead Channel Sensing Intrinsic Amplitude: 5.375 mV
Lead Channel Setting Pacing Amplitude: 2.5 V
Lead Channel Setting Pacing Pulse Width: 0.4 ms
Lead Channel Setting Sensing Sensitivity: 0.3 mV

## 2021-01-19 ENCOUNTER — Other Ambulatory Visit: Payer: Self-pay

## 2021-01-19 DIAGNOSIS — K7460 Unspecified cirrhosis of liver: Secondary | ICD-10-CM

## 2021-01-19 DIAGNOSIS — B181 Chronic viral hepatitis B without delta-agent: Secondary | ICD-10-CM

## 2021-01-20 ENCOUNTER — Other Ambulatory Visit: Payer: Self-pay

## 2021-01-20 ENCOUNTER — Other Ambulatory Visit: Payer: Medicaid Other | Admitting: *Deleted

## 2021-01-20 DIAGNOSIS — I10 Essential (primary) hypertension: Secondary | ICD-10-CM

## 2021-01-20 LAB — BASIC METABOLIC PANEL
BUN/Creatinine Ratio: 10 (ref 9–20)
BUN: 27 mg/dL — ABNORMAL HIGH (ref 6–24)
CO2: 22 mmol/L (ref 20–29)
Calcium: 9 mg/dL (ref 8.7–10.2)
Chloride: 100 mmol/L (ref 96–106)
Creatinine, Ser: 2.66 mg/dL — ABNORMAL HIGH (ref 0.76–1.27)
Glucose: 83 mg/dL (ref 65–99)
Potassium: 4 mmol/L (ref 3.5–5.2)
Sodium: 137 mmol/L (ref 134–144)
eGFR: 27 mL/min/{1.73_m2} — ABNORMAL LOW (ref >59)

## 2021-01-20 NOTE — Progress Notes (Signed)
Remote ICD transmission.   

## 2021-01-26 ENCOUNTER — Telehealth: Payer: Self-pay

## 2021-01-26 NOTE — Telephone Encounter (Signed)
Referred to ICM clinic by Trish Fountain RN Device clinic at 9/15 OV.   Pt agreeable during office visit for ICM follow up. Attempted call to patient for ICM intro and left message for return call.

## 2021-01-28 DIAGNOSIS — F331 Major depressive disorder, recurrent, moderate: Secondary | ICD-10-CM | POA: Diagnosis not present

## 2021-01-28 DIAGNOSIS — F410 Panic disorder [episodic paroxysmal anxiety] without agoraphobia: Secondary | ICD-10-CM | POA: Diagnosis not present

## 2021-01-28 DIAGNOSIS — F639 Impulse disorder, unspecified: Secondary | ICD-10-CM | POA: Diagnosis not present

## 2021-01-29 DIAGNOSIS — I5020 Unspecified systolic (congestive) heart failure: Secondary | ICD-10-CM | POA: Diagnosis not present

## 2021-01-29 DIAGNOSIS — G4733 Obstructive sleep apnea (adult) (pediatric): Secondary | ICD-10-CM | POA: Diagnosis not present

## 2021-01-29 DIAGNOSIS — J9601 Acute respiratory failure with hypoxia: Secondary | ICD-10-CM | POA: Diagnosis not present

## 2021-01-29 DIAGNOSIS — J690 Pneumonitis due to inhalation of food and vomit: Secondary | ICD-10-CM | POA: Diagnosis not present

## 2021-01-30 DIAGNOSIS — J9601 Acute respiratory failure with hypoxia: Secondary | ICD-10-CM | POA: Diagnosis not present

## 2021-01-30 DIAGNOSIS — I5020 Unspecified systolic (congestive) heart failure: Secondary | ICD-10-CM | POA: Diagnosis not present

## 2021-01-30 DIAGNOSIS — J690 Pneumonitis due to inhalation of food and vomit: Secondary | ICD-10-CM | POA: Diagnosis not present

## 2021-02-02 ENCOUNTER — Other Ambulatory Visit: Payer: Self-pay | Admitting: Cardiovascular Disease

## 2021-02-11 ENCOUNTER — Telehealth: Payer: Self-pay | Admitting: Internal Medicine

## 2021-02-11 NOTE — Telephone Encounter (Signed)
..   Medicaid Managed Care   Unsuccessful Outreach Note  02/11/2021 Name: Cody Ortega MRN: [id number] DOB: [birth year]  Referred by: Ladell Pier, MD Reason for referral : High Risk Managed Medicaid (I called the patient today to offer him a phone visit with the [facility] Team. I left my contact info on his VM.)   An unsuccessful telephone outreach was attempted today. The patient was referred to the case management team for assistance with care management and care coordination.   Follow Up Plan: The care management team will reach out to the patient again over the next 7-14 days.   [place]

## 2021-03-01 DIAGNOSIS — I5020 Unspecified systolic (congestive) heart failure: Secondary | ICD-10-CM | POA: Diagnosis not present

## 2021-03-01 DIAGNOSIS — J9601 Acute respiratory failure with hypoxia: Secondary | ICD-10-CM | POA: Diagnosis not present

## 2021-03-01 DIAGNOSIS — G4733 Obstructive sleep apnea (adult) (pediatric): Secondary | ICD-10-CM | POA: Diagnosis not present

## 2021-03-01 DIAGNOSIS — J690 Pneumonitis due to inhalation of food and vomit: Secondary | ICD-10-CM | POA: Diagnosis not present

## 2021-03-02 DIAGNOSIS — I5020 Unspecified systolic (congestive) heart failure: Secondary | ICD-10-CM | POA: Diagnosis not present

## 2021-03-02 DIAGNOSIS — J690 Pneumonitis due to inhalation of food and vomit: Secondary | ICD-10-CM | POA: Diagnosis not present

## 2021-03-02 DIAGNOSIS — J9601 Acute respiratory failure with hypoxia: Secondary | ICD-10-CM | POA: Diagnosis not present

## 2021-03-03 NOTE — Telephone Encounter (Signed)
Attempted ICM intro call and no answer.  

## 2021-03-04 ENCOUNTER — Other Ambulatory Visit: Payer: Self-pay | Admitting: Cardiovascular Disease

## 2021-03-04 DIAGNOSIS — G4733 Obstructive sleep apnea (adult) (pediatric): Secondary | ICD-10-CM | POA: Diagnosis not present

## 2021-03-04 NOTE — Telephone Encounter (Addendum)
Prescription refill request for Xarelto received.  Indication: afib  Last office visit: [place], 09/15/2020 Weight: 125.2 kg  Age: 60 yo  Scr:  2.66, 01/20/2021 CrCl: 53 ml/min   Refill sent

## 2021-03-08 ENCOUNTER — Telehealth: Payer: Self-pay | Admitting: Internal Medicine

## 2021-03-08 ENCOUNTER — Telehealth: Payer: Self-pay

## 2021-03-08 NOTE — Telephone Encounter (Signed)
..   Medicaid Managed Care   Unsuccessful Outreach Note  03/08/2021 Name: Cody Ortega MRN: [id number] DOB: 07-19-1960  Referred by: Ladell Pier, MD Reason for referral : High Risk Managed Medicaid (I called the patient today to offer the services of the [facility] team. I left my name and number on his VM.)   A second unsuccessful telephone outreach was attempted today. The patient was referred to the case management team for assistance with care management and care coordination.   Follow Up Plan: The care management team will reach out to the patient again over the next 14 days.   [place]

## 2021-03-08 NOTE — Telephone Encounter (Signed)
LVM for patient to call device clinic back to assess for HF symptoms.

## 2021-03-09 ENCOUNTER — Encounter: Payer: Self-pay | Admitting: Cardiology

## 2021-03-09 NOTE — Progress Notes (Signed)
This encounter was created in error - please disregard.

## 2021-03-11 NOTE — Telephone Encounter (Signed)
Spoke with ptient.  He denis any s/s of fluid overload.  He does report that he recently started using CPAP and noted coughing up expectorant first thing in the morning, but once he clears it for the day, it doesn't happen again.    Pt confirmed compliance with Lasix 20mg  as prescribed.    Educated patient on decreased sodium intake.    Pt is interested in participating in [facility] clinic.  Advised I would forward message for him to be contacted.

## 2021-03-12 NOTE — Telephone Encounter (Signed)
Attempted ICM call intro and no answer.  Left message for return call.

## 2021-03-24 NOTE — Telephone Encounter (Signed)
ICM intro call to patient.  Provided ICM intro briefly due to he was driving.  He requested call back and leave detailed message on voice mail with all information.  Left detailed voice mail regarding ICM monthly follow up and will schedule 1st ICM transmission on 04/12/2021.  Advised to call if he develops any fluid symptoms and direct ICM number left for call back.

## 2021-03-28 ENCOUNTER — Other Ambulatory Visit: Payer: Self-pay | Admitting: Internal Medicine

## 2021-03-31 DIAGNOSIS — J9601 Acute respiratory failure with hypoxia: Secondary | ICD-10-CM | POA: Diagnosis not present

## 2021-03-31 DIAGNOSIS — J690 Pneumonitis due to inhalation of food and vomit: Secondary | ICD-10-CM | POA: Diagnosis not present

## 2021-03-31 DIAGNOSIS — G4733 Obstructive sleep apnea (adult) (pediatric): Secondary | ICD-10-CM | POA: Diagnosis not present

## 2021-03-31 DIAGNOSIS — I5020 Unspecified systolic (congestive) heart failure: Secondary | ICD-10-CM | POA: Diagnosis not present

## 2021-04-01 DIAGNOSIS — J9601 Acute respiratory failure with hypoxia: Secondary | ICD-10-CM | POA: Diagnosis not present

## 2021-04-01 DIAGNOSIS — I5020 Unspecified systolic (congestive) heart failure: Secondary | ICD-10-CM | POA: Diagnosis not present

## 2021-04-01 DIAGNOSIS — J690 Pneumonitis due to inhalation of food and vomit: Secondary | ICD-10-CM | POA: Diagnosis not present

## 2021-04-03 ENCOUNTER — Other Ambulatory Visit: Payer: Self-pay | Admitting: Internal Medicine

## 2021-04-03 DIAGNOSIS — J449 Chronic obstructive pulmonary disease, unspecified: Secondary | ICD-10-CM

## 2021-04-03 NOTE — Telephone Encounter (Signed)
Requested Prescriptions  Pending Prescriptions Disp Refills  . SYMBICORT 160-4.5 MCG/ACT inhaler [Pharmacy Med Name: SYMBICORT 160/4.5MCG (120 ORAL INH)] 10.2 g 2    Sig: INHALE 2 PUFFS INTO THE LUNGS TWICE DAILY     Pulmonology:  Combination Products Passed - 04/03/2021  6:15 AM      Passed - Valid encounter within last 12 months    Recent Outpatient Visits          3 months ago OSA (obstructive sleep apnea)   [place], Deborah B, MD   5 months ago Essential hypertension   [place], [place], [place]   1 year ago Essential hypertension   [place], Deborah B, MD   1 year ago Class 2 severe obesity due to excess calories with serious comorbidity and body mass index (BMI) of 35.0 to 35.9 in adult [facility])   [place] Ladell Pier, MD   1 year ago Need for vaccination against Streptococcus pneumoniae using pneumococcal conjugate vaccine [place], RPH-CPP      Future Appointments            In 1 week Ladell Pier, MD [place]

## 2021-04-05 ENCOUNTER — Other Ambulatory Visit: Payer: Self-pay

## 2021-04-05 MED ORDER — ENTECAVIR 0.5 MG PO TABS: 0.5000 mg | ORAL_TABLET | ORAL | 0 refills | Status: DC

## 2021-04-08 ENCOUNTER — Encounter: Payer: Self-pay | Admitting: Family Medicine

## 2021-04-08 ENCOUNTER — Ambulatory Visit: Payer: Medicaid Other | Admitting: Family Medicine

## 2021-04-08 ENCOUNTER — Other Ambulatory Visit: Payer: Self-pay

## 2021-04-08 VITALS — BP 110/78 | HR 66 | Ht 75.0 in | Wt 280.5 lb

## 2021-04-08 DIAGNOSIS — Z9989 Dependence on other enabling machines and devices: Secondary | ICD-10-CM

## 2021-04-08 DIAGNOSIS — G4733 Obstructive sleep apnea (adult) (pediatric): Secondary | ICD-10-CM | POA: Diagnosis not present

## 2021-04-08 DIAGNOSIS — R519 Headache, unspecified: Secondary | ICD-10-CM

## 2021-04-08 NOTE — Patient Instructions (Addendum)
Please continue using your CPAP regularly. While your insurance requires that you use CPAP at least 4 hours each night on 70% of the nights, I recommend, that you not skip any nights and use it throughout the night if you can. Getting used to CPAP and staying with the treatment long term does take time and patience and discipline. Untreated obstructive sleep apnea when it is moderate to severe can have an adverse impact on cardiovascular health and raise her risk for heart disease, arrhythmias, hypertension, congestive heart failure, stroke and diabetes. Untreated obstructive sleep apnea causes sleep disruption, nonrestorative sleep, and sleep deprivation. This can have an impact on your day to day functioning and cause daytime sleepiness and impairment of cognitive function, memory loss, mood disturbance, and problems focussing. Using CPAP regularly can improve these symptoms.  Please bring me your CPAP machine and card as soon as you can so I can check the data.   Follow up in 4 months

## 2021-04-08 NOTE — Progress Notes (Addendum)
Chief Complaint  Patient presents with   Obstructive Sleep Apnea    Rm 2, alone. Here for initial CPAP f/u. Pt continues to have issues with finding a mask to work. Having anxiety with it on at night. Reports using nightly. Pt did not bring machine in today.      HISTORY OF PRESENT ILLNESS:  04/08/21 ALL:  Cody Ortega is a 60 y.o. male here today for follow up for OSA on CPAP and headaches. He was seen in consult with Cody Ortega 08/2020 for recurrent tension style headaches. CT head was unremarkable. PSG 11/26/2020 showed severe OSA with AHI 59.1/hr and O2 nadir 75%. He ws started on CPAP therapy. He does states that he is using CPAP consistently. He feels he uses for at least 4 hours every day but reports that his sleep schedule is all off. He does not go to bed at the same time each night and may take several naps during the day. He is on disability for CHF and not able to work. He did not bring CPAP to review data. He reports feeling "like an 60 year old" for two days right after starting therapy but not sure he has continues to note benefit.   Addendum:    HISTORY (copied from Cody Ortega previous note)  Dear Cody Ortega,    I saw your patient, Cody Ortega, upon your kind request in my neurologic clinic today for initial consultation of his recurrent headaches including nocturnal headaches.  The patient is unaccompanied today.  As you know, Cody Ortega is a 60 year old right-handed man with an underlying complex medical history of coronary artery disease, cardiomyopathy, chronic systolic congestive heart failure, atrial fibrillation, status post AICD implant in 2012 with generator change out in September 2021, chronic kidney disease, history of hepatitis B, history of substance use, history of liver cirrhosis anxiety, arthritis, asthma, anemia, stroke, thyroid disease, hypertension, hyperlipidemia, sleep apnea, not on CPAP therapy, and obesity, who reports recurrent headaches in the past  few months.  He does not have a prior history of headaches or migraines.  His headaches are not debilitating and not one-sided or throbbing, no associated nausea or vomiting but he does have some light sensitivity.  He feels that his headaches got worse as the day progresses and are made worse by stress.  He tries to hydrate well with water but drinks quite a bit of caffeine, at least 4 cans of soda and about 1 serving of tea daily, typically no coffee.  He tries to drink about 3-4 bottles of water per day.  He does not drink any alcohol.  He reports a family history of alcoholism and drug addiction.  He has a remote history of cocaine use.  He is followed by psychiatry for his anxiety and depression and reports fluctuating mood related symptoms, some increase in depressive symptoms from time to time.  He is followed closely by psychiatry and sees Cody Chapel, NP.  I reviewed your office note from 09/03/2020.  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  He is followed by ophthalmology/retina specialist and cardiology.  He is on numerous medications. He had a head CT without contrast as well as cervical spine CT without contrast on 07/19/2010 and I was able to review the results:  IMPRESSION:  No acute bony abnormality of the cervical spine.    Cannot exclude a disc bulge  at C4-5.  Appearances are similar  compared to prior study of 2006.    He had a sleep study several years ago, he is not sure how severe his sleep apnea was but he tried CPAP and could not tolerate it.  He would be willing to try a nasal interface but reports that he would not be able to use a facemask.  He is trying to lose weight.  He is trying to stay active.  He lives with his girlfriend.   He had prescription eyeglasses some years ago but these caused headaches.  His current headaches are intermittent, not long-lasting, are not  associated with any neurological accompaniments.  He has a history of stroke some 6 years ago.    He reports a fall last week but did not injure himself.   He does not sleep well and does not keep a very set schedule, he may be in bed anywhere between 3 AM and 4:30 AM, rise time can vary between 1:30 PM to 5 PM.  Epworth sleepiness score is 7 out of 24, fatigue severity score is 46 out of 63.   He reports chronic low back pain and herniated disc in the lower back in the 90s.   REVIEW OF SYSTEMS: Out of a complete 14 system review of symptoms, the patient complains only of the following symptoms, fatigue, shob, anxiety all other reviewed systems are negative.  ESS: 10   ALLERGIES: Allergies  Allergen Reactions   Prochlorperazine Other (See Comments)    Medication caused a lot of issues like blurred vision, nausea, pain, and carpool turnel   Codeine Itching, Nausea And Vomiting and Hives   Hydrocodone-Acetaminophen Itching and Nausea And Vomiting     HOME MEDICATIONS: Outpatient Medications Prior to Visit  Medication Sig Dispense Refill   acetaminophen (TYLENOL) 500 MG tablet Take 1,000 mg by mouth every 6 (six) hours as needed for moderate pain or headache.     albuterol (PROVENTIL) (2.5 MG/3ML) 0.083% nebulizer solution Take 3 mLs (2.5 mg total) by nebulization every 6 (six) hours as needed for wheezing or shortness of breath. 150 mL 1   albuterol (VENTOLIN HFA) 108 (90 Base) MCG/ACT inhaler INHALE 2 PUFFS INTO THE LUNGS EVERY 6 HOURS AS NEEDED FOR WHEEZING OR SHORTNESS OF BREATH 8.5 g 6   alprazolam (XANAX) 2 MG tablet Take 2 mg by mouth every 6 (six) hours as needed for anxiety.     amiodarone (PACERONE) 200 MG tablet Take 1 tablet (200 mg total) by mouth daily. 180 tablet 3   atorvastatin (LIPITOR) 10 MG tablet Take 1 tablet (10 mg total) by mouth daily. 30 tablet 11   carvedilol (COREG) 12.5 MG tablet TAKE 1 TABLET(12.5 MG) BY MOUTH TWICE DAILY WITH A MEAL 180 tablet 3    dapagliflozin propanediol (FARXIGA) 10 MG TABS tablet Take 1 tablet (10 mg total) by mouth daily before breakfast. 30 tablet 5   Dextran 70-Hypromellose, PF, ([employer] NATURAL TEARS) 0.1-0.3 % SOLN Place 1 drop into both eyes daily as needed (dry eyes).     DULoxetine (CYMBALTA) 30 MG capsule Take 90 mg by mouth daily.      entecavir (BARACLUDE) 0.5 MG tablet Take 1 tablet (0.5 mg total) by mouth every other day. Please schedule an office visit for further refills. 45 tablet 0   ezetimibe (ZETIA) 10 MG tablet TAKE 1 TABLET(10 MG) BY MOUTH DAILY 90 tablet 2   ferrous sulfate 325 (65 FE) MG tablet Take 325 mg by mouth  daily with breakfast.     fluticasone (FLONASE) 50 MCG/ACT nasal spray SHAKE LIQUID AND USE 1 SPRAY IN EACH NOSTRIL DAILY 16 g 2   furosemide (LASIX) 40 MG tablet Take 20 mg by mouth daily.     hydrALAZINE (APRESOLINE) 50 MG tablet TAKE 1 TABLET(50 MG) BY MOUTH TWICE DAILY 180 tablet 0   ondansetron (ZOFRAN-ODT) 8 MG disintegrating tablet Take 8 mg by mouth 2 (two) times daily as needed for nausea or vomiting.     oxybutynin (DITROPAN) 5 MG tablet Take 1 tablet (5 mg total) by mouth 3 (three) times daily. 90 tablet 2   polyethylene glycol (MIRALAX) 17 g packet Take as directed, Daily, titrate as needed 14 each 0   sacubitril-valsartan (ENTRESTO) 49-51 MG Take 1 tablet by mouth 2 (two) times daily. 60 tablet 11   sildenafil (REVATIO) 20 MG tablet Take 2-5 tablets by mouth once daily as needed prior to sexual activity. 100 tablet 3   SYMBICORT 160-4.5 MCG/ACT inhaler INHALE 2 PUFFS INTO THE LUNGS TWICE DAILY 10.2 g 2   tamsulosin (FLOMAX) 0.4 MG CAPS capsule Take 1 capsule (0.4 mg total) by mouth daily. 30 capsule 3   XARELTO 20 MG TABS tablet TAKE 1 TABLET(20 MG) BY MOUTH DAILY WITH SUPPER 90 tablet 1   No facility-administered medications prior to visit.     PAST MEDICAL HISTORY: Past Medical History:  Diagnosis Date   Anxiety    Arthritis    Benzodiazepine dependence ([place])     Bronchial asthma    CAD (coronary artery disease)    a. Cath 03/2010: mod RCA stenosis, severe diag stenosis, treated medically given lack of angina.   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 Episode in 2008 with spontaneous conversion, on amiodarone per EP.   S/P implantation of automatic cardioverter/defibrillator (AICD)    a. [employer]tronic, implanted 2012.   Sleep apnea    Stroke [facility]) 2004   Thyroid disease      PAST SURGICAL HISTORY: Past Surgical History:  Procedure Laterality Date   CARDIOVERSION N/A 01/07/2014   Procedure: CARDIOVERSION;  Surgeon: Lelon Perla, MD;  Location: [facility] ENDOSCOPY;  Service: Cardiovascular;  Laterality: N/A;   CARDIOVERSION N/A 02/11/2014   Procedure: CARDIOVERSION;  Surgeon: Pixie Casino, MD;  Location: [place];  Service: Cardiovascular;  Laterality: N/A;   CARDIOVERSION N/A 10/01/2020   Procedure: CARDIOVERSION;  Surgeon: Werner Lean, MD;  Location: [facility] ENDOSCOPY;  Service: Cardiovascular;  Laterality: N/A;   CATARACT EXTRACTION     COLONOSCOPY WITH PROPOFOL N/A 02/06/2018   Procedure: COLONOSCOPY WITH PROPOFOL;  Surgeon: Yetta Flock, MD;  Location: [facility] ENDOSCOPY;  Service: Gastroenterology;  Laterality: N/A;   EYE SURGERY     ICD GENERATOR CHANGEOUT N/A 01/17/2020   Procedure: ICD GENERATOR CHANGEOUT;  Surgeon: Evans Lance, MD;  Location: [place] CV LAB;  Service: Cardiovascular;  Laterality: N/A;   [facility]K eye  surgery Bilateral 05/2018   LASIK Bilateral     PACEMAKER INSERTION     POLYPECTOMY  02/06/2018   Procedure: POLYPECTOMY;  Surgeon: Yetta Flock, MD;  Location: [facility] ENDOSCOPY;  Service: Gastroenterology;;   TEE WITHOUT CARDIOVERSION N/A 01/07/2014   Procedure: TRANSESOPHAGEAL ECHOCARDIOGRAM (TEE);  Surgeon: Lelon Perla, MD;  Location: [facility] ENDOSCOPY;  Service: Cardiovascular;  Laterality: N/A;   TEE WITHOUT CARDIOVERSION N/A 02/11/2014   Procedure: TRANSESOPHAGEAL ECHOCARDIOGRAM (TEE);  Surgeon: Pixie Casino, MD;  Location: [facility] ENDOSCOPY;  Service: Cardiovascular;  Laterality: N/A;   TEE WITHOUT CARDIOVERSION N/A 10/01/2020   Procedure: TRANSESOPHAGEAL ECHOCARDIOGRAM (TEE);  Surgeon: Werner Lean, MD;  Location: [facility] ENDOSCOPY;  Service: Cardiovascular;  Laterality: N/A;   TONSILLECTOMY     TRANSTHORACIC ECHOCARDIOGRAM  10/2006, 12/2009     FAMILY HISTORY: Family History  Problem Relation Age of Onset   Breast cancer Mother    Multiple myeloma Mother    Prostate cancer Father    Diabetes Father    Peripheral vascular disease Father    Cerebral palsy Daughter    Lung disease Neg Hx    Colon cancer Neg Hx    Stomach cancer Neg Hx    Esophageal cancer Neg Hx    Colon polyps Neg Hx      SOCIAL HISTORY: Social History   Socioeconomic History   Marital status: Divorced    Spouse name: Not on file   Number of children: 2   Years of education: Not on file   Highest education level: Not on file  Occupational History   Occupation: Unemployed    Employer: UNEMPLOYED    Ortega: Writer in the past  Tobacco Use   Smoking status: Former    Packs/day: 0.50    Years: 0.50    Pack years: 0.25    Types: Cigarettes    Quit date: 05/02/2010    Years since quitting: 10.9   Smokeless tobacco: Never   Tobacco comments:    Significant Second-hand and only 3 months himself  Vaping Use   Vaping Use: Never used  Substance and Sexual Activity   Alcohol use: Not Currently    Alcohol/week: 0.0 standard  drinks    Ortega: rare   Drug use: No    Ortega: Reported history of cocaine abuse off and on    Sexual activity: Not on file  Other Topics Concern   Not on file  Social History Narrative   Living with a son who is a 74 year old.  He is not     working, unemployed for 3 years.  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  Denied use of alcohol.  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       [facility]:   Originally from [place], [place]. He grew up in [place]. He moved to [place] in 1985. 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 No pets currently. No bird exposure. He did have mold in a previous home. No hot tub exposure.         Social Determinants of Health   Financial Resource Strain: Not on file  Food Insecurity: Not on file  Transportation Needs: Not on file  Physical Activity: Not on file  Stress: Not on file  Social Connections: Not on file  Intimate Partner Violence: Not on file     PHYSICAL EXAM  Vitals:   04/08/21  1438  BP: 110/78  Pulse: 66  SpO2: 95%  Weight: 280 lb 8 oz (127.2 kg)  Height: 6' 3" (1.905 m)   Body mass index is 35.06 kg/m.  Generalized: Well developed, in no acute distress  Cardiology: normal rate and rhythm, no murmur auscultated  Respiratory: clear to auscultation bilaterally    Neurological examination  Mentation: Alert oriented to time, place, history taking. Follows all commands speech and language fluent Cranial nerve II-XII: Pupils were equal round reactive to light. Extraocular movements were full, visual field were full on confrontational test. Facial sensation and strength were normal. Head turning and shoulder shrug  were normal and symmetric. Motor: The motor testing reveals 5 over 5 strength of all 4 extremities. Good symmetric motor tone is noted throughout.  Gait and station: Gait is normal.    DIAGNOSTIC DATA (LABS, IMAGING, TESTING) - I  reviewed patient records, labs, notes, testing and imaging myself where available.  Lab Results  Component Value Date   WBC 7.9 10/15/2020   HGB 16.0 10/15/2020   HCT 48.4 10/15/2020   MCV 92 10/15/2020   PLT 272 10/15/2020      Component Value Date/Time   NA 137 01/20/2021 0910   K 4.0 01/20/2021 0910   CL 100 01/20/2021 0910   CO2 22 01/20/2021 0910   GLUCOSE 83 01/20/2021 0910   GLUCOSE 101 (H) 10/07/2020 0303   BUN 27 (H) 01/20/2021 0910   CREATININE 2.66 (H) 01/20/2021 0910   CREATININE 1.38 (H) 02/08/2016 1155   CALCIUM 9.0 01/20/2021 0910   PROT 6.9 12/09/2020 1458   ALBUMIN 4.2 12/09/2020 1458   AST 13 12/09/2020 1458   ALT 7 12/09/2020 1458   ALKPHOS 75 12/09/2020 1458   BILITOT 0.4 12/09/2020 1458   GFRNONAA 34 (L) 10/07/2020 0303   GFRAA 36 (L) 06/11/2020 1529   Lab Results  Component Value Date   CHOL 196 05/08/2020   HDL 33 (L) 05/08/2020   LDLCALC 138 (H) 05/08/2020   LDLDIRECT 102 (H) 09/15/2020   TRIG 135 05/08/2020   CHOLHDL 5.9 (H) 05/08/2020   Lab Results  Component Value Date   HGBA1C 5.6 12/09/2020   Lab Results  Component Value Date   VITAMINB12 696 07/09/2018   Lab Results  Component Value Date   TSH 2.390 09/15/2020    No flowsheet data found.   No flowsheet data found.   ASSESSMENT AND PLAN  60 y.o. 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 here with    OSA on CPAP - Plan: For home use only DME continuous positive airway pressure (CPAP)  Recurrent headache  He reports using CPAP consistently. He did feel more energized for a few days after starting therapy but has not noted any significant difference since. He did not bring CPAP and I am unable to find data on [facility]. He will bring CPAP and SD card next week for me to review compliance report. I will send him for a mask refitting as he prefers mask used during sleep study over current nasal pillow. We  have discussed need for sleep hygiene and educational material provided today. He will follow up with me in 4 months.   Addendum 04/12/2021: compliance report shows acceptable daily use and sub optimal 4 hour usage. He was encouraged to continue CPAP every night for at least 4 hours. We have sent him for mask refit. He will monitor for leak at home. He will follow up in 3-4 months.    Orders Placed This Encounter  Procedures   For home use only DME continuous positive airway pressure (CPAP)    Mask refitting, requests medium P10 nasal pillow    Order Specific Question:   Length of Need    Answer:   Lifetime    Order Specific Question:   Patient has OSA or probable OSA    Answer:   Yes    Order Specific Question:   Is the patient currently using CPAP in the home    Answer:   Yes    Order Specific Question:   Settings    Answer:   Other see comments    Order Specific Question:   CPAP supplies needed    Answer:   Mask, headgear, cushions, filters, heated tubing and water chamber      No orders of the defined types were placed in this encounter.     Debbora Presto, MSN, FNP-C 04/08/2021, 3:02 PM  [facility] [address] [place], [place] [address] [phone]

## 2021-04-12 ENCOUNTER — Ambulatory Visit: Payer: Medicaid Other | Attending: Internal Medicine | Admitting: Internal Medicine

## 2021-04-12 ENCOUNTER — Telehealth: Payer: Self-pay | Admitting: *Deleted

## 2021-04-12 ENCOUNTER — Encounter: Payer: Self-pay | Admitting: Internal Medicine

## 2021-04-12 ENCOUNTER — Other Ambulatory Visit: Payer: Self-pay

## 2021-04-12 ENCOUNTER — Ambulatory Visit (INDEPENDENT_AMBULATORY_CARE_PROVIDER_SITE_OTHER): Payer: Medicaid Other

## 2021-04-12 VITALS — BP 111/70 | HR 50 | Resp 16 | Wt 282.0 lb

## 2021-04-12 DIAGNOSIS — Z23 Encounter for immunization: Secondary | ICD-10-CM | POA: Diagnosis not present

## 2021-04-12 DIAGNOSIS — I5022 Chronic systolic (congestive) heart failure: Secondary | ICD-10-CM

## 2021-04-12 DIAGNOSIS — J449 Chronic obstructive pulmonary disease, unspecified: Secondary | ICD-10-CM | POA: Diagnosis not present

## 2021-04-12 DIAGNOSIS — I480 Paroxysmal atrial fibrillation: Secondary | ICD-10-CM | POA: Diagnosis not present

## 2021-04-12 DIAGNOSIS — Z9989 Dependence on other enabling machines and devices: Secondary | ICD-10-CM | POA: Diagnosis not present

## 2021-04-12 DIAGNOSIS — N184 Chronic kidney disease, stage 4 (severe): Secondary | ICD-10-CM | POA: Diagnosis not present

## 2021-04-12 DIAGNOSIS — Z6835 Body mass index (BMI) 35.0-35.9, adult: Secondary | ICD-10-CM

## 2021-04-12 DIAGNOSIS — I10 Essential (primary) hypertension: Secondary | ICD-10-CM | POA: Diagnosis not present

## 2021-04-12 DIAGNOSIS — Z2821 Immunization not carried out because of patient refusal: Secondary | ICD-10-CM | POA: Diagnosis not present

## 2021-04-12 DIAGNOSIS — G4733 Obstructive sleep apnea (adult) (pediatric): Secondary | ICD-10-CM | POA: Diagnosis not present

## 2021-04-12 DIAGNOSIS — Z95810 Presence of automatic (implantable) cardiac defibrillator: Secondary | ICD-10-CM

## 2021-04-12 MED ORDER — ALBUTEROL SULFATE (2.5 MG/3ML) 0.083% IN NEBU: 2.5000 mg | INHALATION_SOLUTION | Freq: Four times a day (QID) | RESPIRATORY_TRACT | 6 refills | Status: DC | PRN

## 2021-04-12 NOTE — Patient Instructions (Signed)

## 2021-04-12 NOTE — Progress Notes (Signed)
Patient ID: Cody Ortega, male    DOB: 08/05/1960  MRN: [id number]  CC: Hypertension   Subjective: Cody Ortega is a 60 y.o. male who presents for chronic ds management His concerns today include:  The patient with history of COPD and asthma, HTN, CKD stage III (Dr. Candiss Norse), CAD, and NICM with chronic systolic CHF (EF 66-29% 07/7652) and ICD, PAF, immune active hep B, adenomatous polyps, dep/anx (followed by psychiatrist, Noemi Chapel), OSA (intolerant of CPAP mask), hypoxic resp failure on O2 (mainly at nights now with CPAP).    OSA:  did get CPAP.  Reports he is trying to stay discipline and use the machine.  Using it every night now -Has a nasal mask.  Had tried different masks and the nasal one is the one that feels most comfortable.  Sometimes he wakes with rattle in chest at nights.   Uses O2 attach to CPAP. Rarely uses O2 during the day unless he really exerts himself like pulling his trash can up from the street.  HYPERTENSION/CHF/PAF Currently taking: see medication list.  Reports compliance with amiodarone, carvedilol, furosemide, hydralazine, Entresto, Xarelto Med Adherence: $RemoveBefore'[x]'rmdsszijxabRB$  Yes  but sometimes he forgets to take meds on time.  He has a pill box.  Nervous about some meds that look a like Medication side effects: $RemoveBefor'[]'mYpaLhNyRPmA$  Yes    '[x]'$  No Adherence with salt restriction: $RemoveBeforeDEI'[x]'DuKCOmrDlwVZVfRw$  Yes    '[]'$  No Home Monitoring?: $RemoveBeforeDEI'[]'ZtHsIVABRWlChvKl$  Yes    '[]'$  No Monitoring Frequency:  Home BP results range:  SOB? $Rem'[]'yaZa$  Yes    '[x]'$  No Chest Pain?: $RemoveBefo'[]'eUVXVIMAulJ$  Yes    '[x]'$  No Leg swelling?: $RemoveBefore'[]'fLmmTTXTWVMiU$  Yes    '[x]'$  No Headaches?: $RemoveBefore'[]'CpXNfWQNibzFv$  Yes    '[x]'$  No Dizziness? $RemoveBefor'[]'lYOlYJlqmGAl$  Yes    '[x]'$  No Comments: bruises easily if he bumps into something.    Obesity:  gained 8 lbs since 11/2020.  Lacks motivation to get back to the pool for water aerobics.  Has only been in the pool 3 x this yr. not able to do much exercise otherwise due to arthritis in his knees. -doing good on portion control but still drinks about three 12 oz cans of Coco Cola day  CKD 3-4:  has not  seen Dr. Candiss Norse in a while.  Still makes good urine.  Creatinine has ranged 2.1-2.95 over the past several months.  GFR has ranged between 24-37 with last GFR in September being 24.  COPD/asthma: Reports he is doing okay on Symbicort.  Requests refill on albuterol solution.  HM:  does not want flu shot   Patient Active Problem List   Diagnosis Date Noted   Chronic hypoxemic respiratory failure ([place]) 12/12/2020   Secondary hypercoagulable state ([place]) 10/14/2020   Acute respiratory failure with hypoxia (HCC) 10/03/2020   Aspiration pneumonia of both lower lobes due to gastric secretions ([place]) 10/03/2020   Aspiration pneumonia ([place]) 10/03/2020   HFrEF (heart failure with reduced ejection fraction) (HCC)    Stage 3b chronic kidney disease ([place]) 09/05/2019   BPH (benign prostatic hyperplasia) 06/19/2019   Dental abscess 04/30/2018   Adenomatous polyp of colon 02/09/2018   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 agent with hepatic coma 02/09/2018   Osteoarthritis of both knees 06/12/2017   COPD with asthma ([place]) 03/17/2016   Chronic seasonal allergic rhinitis 03/17/2016   GERD (gastroesophageal reflux disease) 03/17/2016   Chronic pain syndrome 03/16/2015   Obstructive sleep apnea 03/16/2015   Chronic low back pain with right-sided sciatica 03/16/2015   Sinus brady-tachy syndrome (  [place]) 02/20/2014   Atrial fibrillation ([place]) 12/31/2013   Automatic implantable cardioverter-defibrillator in situ 03/08/2011   Erectile dysfunction 02/28/2011   CAD, NATIVE VESSEL 04/26/2010   PURE HYPERCHOLESTEROLEMIA 04/12/2010   HLD (hyperlipidemia) 01/11/2010   Essential hypertension, malignant 35/70/1779   Chronic systolic heart failure ([place]) 12/18/2009     Current Outpatient Medications on File Prior to Visit  Medication Sig Dispense Refill   acetaminophen (TYLENOL) 500 MG tablet Take 1,000 mg by mouth every 6 (six) hours as needed for moderate pain or headache.     albuterol (VENTOLIN HFA) 108 (90 Base)  MCG/ACT inhaler INHALE 2 PUFFS INTO THE LUNGS EVERY 6 HOURS AS NEEDED FOR WHEEZING OR SHORTNESS OF BREATH 8.5 g 6   alprazolam (XANAX) 2 MG tablet Take 2 mg by mouth every 6 (six) hours as needed for anxiety.     amiodarone (PACERONE) 200 MG tablet Take 1 tablet (200 mg total) by mouth daily. 180 tablet 3   atorvastatin (LIPITOR) 10 MG tablet Take 1 tablet (10 mg total) by mouth daily. 30 tablet 11   carvedilol (COREG) 12.5 MG tablet TAKE 1 TABLET(12.5 MG) BY MOUTH TWICE DAILY WITH A MEAL 180 tablet 3   dapagliflozin propanediol (FARXIGA) 10 MG TABS tablet Take 1 tablet (10 mg total) by mouth daily before breakfast. 30 tablet 5   Dextran 70-Hypromellose, PF, ([facility] NATURAL TEARS) 0.1-0.3 % SOLN Place 1 drop into both eyes daily as needed (dry eyes).     DULoxetine (CYMBALTA) 30 MG capsule Take 90 mg by mouth daily.      entecavir (BARACLUDE) 0.5 MG tablet Take 1 tablet (0.5 mg total) by mouth every other day. Please schedule an office visit for further refills. 45 tablet 0   ezetimibe (ZETIA) 10 MG tablet TAKE 1 TABLET(10 MG) BY MOUTH DAILY 90 tablet 2   ferrous sulfate 325 (65 FE) MG tablet Take 325 mg by mouth daily with breakfast.     fluticasone (FLONASE) 50 MCG/ACT nasal spray SHAKE LIQUID AND USE 1 SPRAY IN EACH NOSTRIL DAILY 16 g 2   furosemide (LASIX) 40 MG tablet Take 20 mg by mouth daily.     hydrALAZINE (APRESOLINE) 50 MG tablet TAKE 1 TABLET(50 MG) BY MOUTH TWICE DAILY 180 tablet 0   ondansetron (ZOFRAN-ODT) 8 MG disintegrating tablet Take 8 mg by mouth 2 (two) times daily as needed for nausea or vomiting.     oxybutynin (DITROPAN) 5 MG tablet Take 1 tablet (5 mg total) by mouth 3 (three) times daily. 90 tablet 2   polyethylene glycol (MIRALAX) 17 g packet Take as directed, Daily, titrate as needed 14 each 0   sacubitril-valsartan (ENTRESTO) 49-51 MG Take 1 tablet by mouth 2 (two) times daily. 60 tablet 11   sildenafil (REVATIO) 20 MG tablet Take 2-5 tablets by mouth once daily as needed  prior to sexual activity. 100 tablet 3   SYMBICORT 160-4.5 MCG/ACT inhaler INHALE 2 PUFFS INTO THE LUNGS TWICE DAILY 10.2 g 2   tamsulosin (FLOMAX) 0.4 MG CAPS capsule Take 1 capsule (0.4 mg total) by mouth daily. 30 capsule 3   XARELTO 20 MG TABS tablet TAKE 1 TABLET(20 MG) BY MOUTH DAILY WITH SUPPER 90 tablet 1   No current facility-administered medications on file prior to visit.    Allergies  Allergen Reactions   Prochlorperazine Other (See Comments)    Medication caused a lot of issues like blurred vision, nausea, pain, and carpool turnel   Codeine Itching, Nausea And Vomiting and Hives  Hydrocodone-Acetaminophen Itching and Nausea And Vomiting    Social History   Socioeconomic History   Marital status: Divorced    Spouse name: Not on file   Number of children: 2   Years of education: Not on file   Highest education level: Not on file  Occupational History   Occupation: Unemployed    Employer: UNEMPLOYED    Comment: basketball referee in the past  Tobacco Use   Smoking status: Former    Packs/day: 0.50    Years: 0.50    Pack years: 0.25    Types: Cigarettes    Quit date: 05/02/2010    Years since quitting: 10.9   Smokeless tobacco: Never   Tobacco comments:    Significant Second-hand and only 3 months himself  Vaping Use   Vaping Use: Never used  Substance and Sexual Activity   Alcohol use: Not Currently    Alcohol/week: 0.0 standard drinks    Comment: rare   Drug use: No    Comment: Reported history of cocaine abuse off and on    Sexual activity: Not on file  Other Topics Concern   Not on file  Social History Narrative   Living with a son who is a 56 year old.  He is not     working, unemployed for 3 years.  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  Denied use of alcohol.  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        Pulmonary:   Originally from [place], [place]. He grew up in [place]. He moved to [place] in 1985. 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 No pets currently. No bird exposure. He did have mold in a previous home. No hot tub exposure.         Social Determinants of Health   Financial Resource Strain: Not on file  Food Insecurity: Not on file  Transportation Needs: Not on file  Physical Activity: Not on file  Stress: Not on file  Social Connections: Not on file  Intimate Partner Violence: Not on file    Family History  Problem Relation Age of Onset   Breast cancer Mother    Multiple myeloma Mother    Prostate cancer Father    Diabetes Father    Peripheral vascular disease Father    Cerebral palsy Daughter    Lung disease Neg Hx    Colon cancer Neg Hx    Stomach cancer Neg Hx    Esophageal cancer Neg Hx    Colon polyps Neg Hx     Past Surgical History:  Procedure Laterality Date   CARDIOVERSION N/A 01/07/2014   Procedure: CARDIOVERSION;  Surgeon: Lelon Perla, MD;  Location: [facility] ENDOSCOPY;  Service: Cardiovascular;  Laterality: N/A;   CARDIOVERSION N/A 02/11/2014   Procedure: CARDIOVERSION;  Surgeon: Pixie Casino, MD;  Location: [facility] ENDOSCOPY;  Service: Cardiovascular;  Laterality: N/A;   CARDIOVERSION N/A 10/01/2020   Procedure: CARDIOVERSION;  Surgeon: Werner Lean, MD;  Location: [facility] ENDOSCOPY;  Service: Cardiovascular;  Laterality: N/A;   CATARACT EXTRACTION     COLONOSCOPY WITH PROPOFOL N/A 02/06/2018   Procedure: COLONOSCOPY WITH PROPOFOL;  Surgeon: Yetta Flock, MD;  Location: [facility] ENDOSCOPY;  Service: Gastroenterology;  Laterality: N/A;   EYE SURGERY     ICD GENERATOR CHANGEOUT N/A 01/17/2020   Procedure: ICD GENERATOR CHANGEOUT;  Surgeon: Evans Lance, MD;  Location:  [facility] INVASIVE CV LAB;  Service: Cardiovascular;  Laterality: N/A;   LASEK eye surgery Bilateral 05/2018   LASIK Bilateral    PACEMAKER INSERTION     POLYPECTOMY  02/06/2018   Procedure: POLYPECTOMY;  Surgeon: Yetta Flock, MD;   Location: [facility] ENDOSCOPY;  Service: Gastroenterology;;   TEE WITHOUT CARDIOVERSION N/A 01/07/2014   Procedure: TRANSESOPHAGEAL ECHOCARDIOGRAM (TEE);  Surgeon: Lelon Perla, MD;  Location: [facility] ENDOSCOPY;  Service: Cardiovascular;  Laterality: N/A;   TEE WITHOUT CARDIOVERSION N/A 02/11/2014   Procedure: TRANSESOPHAGEAL ECHOCARDIOGRAM (TEE);  Surgeon: Pixie Casino, MD;  Location: [facility] ENDOSCOPY;  Service: Cardiovascular;  Laterality: N/A;   TEE WITHOUT CARDIOVERSION N/A 10/01/2020   Procedure: TRANSESOPHAGEAL ECHOCARDIOGRAM (TEE);  Surgeon: Werner Lean, MD;  Location[facility] ENDOSCOPY;  Service: Cardiovascular;  Laterality: N/A;   TONSILLECTOMY     TRANSTHORACIC ECHOCARDIOGRAM  10/2006, 12/2009    ROS: Review of Systems Negative except as stated above  PHYSICAL EXAM: BP 111/70   Pulse (!) 50   Resp 16   Wt 282 lb (127.9 kg)   SpO2 97%   BMI 35.25 kg/m   Wt Readings from Last 3 Encounters:  04/12/21 282 lb (127.9 kg)  04/08/21 280 lb 8 oz (127.2 kg)  01/01/21 276 lb (125.2 kg)    Physical Exam  General appearance - alert, well appearing, older Caucasian male and in no distress Mental status - normal mood, behavior, speech, dress, motor activity, and thought processes Chest - clear to auscultation, no wheezes, rales or rhonchi, symmetric air entry Heart - normal rate, regular rhythm, normal S1, S2, no murmurs, rubs, clicks or gallops Breasts -mild to moderate gynecomastia bilaterally Extremities -no lower extremity edema.  CMP Latest Ref Rng & Units 01/20/2021 01/01/2021 12/24/2020  Glucose 65 - 99 mg/dL 83 91 87  BUN 6 - 24 mg/dL 27(H) 40(H) 32(H)  Creatinine 0.76 - 1.27 mg/dL 2.66(H) 2.95(H) 2.72(H)  Sodium 134 - 144 mmol/L 137 139 139  Potassium 3.5 - 5.2 mmol/L 4.0 3.8 4.8  Chloride 96 - 106 mmol/L 100 99 100  CO2 20 - 29 mmol/L $RemoveB'22 24 24  'OXZvWEle$ Calcium 8.7 - 10.2 mg/dL 9.0 9.0 9.7  Total Protein 6.0 - 8.5 g/dL - - -  Total Bilirubin 0.0 - 1.2 mg/dL - - -  Alkaline Phos  44 - 121 IU/L - - -  AST 0 - 40 IU/L - - -  ALT 0 - 44 IU/L - - -   Lipid Panel     Component Value Date/Time   CHOL 196 05/08/2020 1332   TRIG 135 05/08/2020 1332   HDL 33 (L) 05/08/2020 1332   CHOLHDL 5.9 (H) 05/08/2020 1332   CHOLHDL 3.9 10/12/2015 1026   VLDL 27 10/12/2015 1026   LDLCALC 138 (H) 05/08/2020 1332   LDLDIRECT 102 (H) 09/15/2020 1635    CBC    Component Value Date/Time   WBC 7.9 10/15/2020 1118   WBC 9.2 10/03/2020 0245   RBC 5.25 10/15/2020 1118   RBC 5.04 10/03/2020 0245   HGB 16.0 10/15/2020 1118   HCT 48.4 10/15/2020 1118   PLT 272 10/15/2020 1118   MCV 92 10/15/2020 1118   MCH 30.5 10/15/2020 1118   MCH 30.8 10/03/2020 0245   MCHC 33.1 10/15/2020 1118   MCHC 32.8 10/03/2020 0245   RDW 13.6 10/15/2020 1118   LYMPHSABS 0.8 10/15/2020 1118   MONOABS 820 01/26/2016 1025   EOSABS 0.1 10/15/2020 1118   BASOSABS 0.0 10/15/2020 1118  ASSESSMENT AND PLAN: 1. Essential hypertension At goal.  Continue current medications and low-salt diet.  2. Class 2 severe obesity with serious comorbidity and body mass index (BMI) of 35.0 to 35.9 in adult, unspecified obesity type [facility]) Discussed and encourage healthy eating habits.  Strongly encouraged him to eliminate the sugary drinks from his diet.  Encouraged him to get back to water aerobics.  Discussed how further weight gain can negatively impact his health.  3. OSA on CPAP Encouraged him to continue compliance with CPAP.  He reports compliance and reports that he wakes up feeling more rested  4. Chronic systolic heart failure (HCC) Stable and compensated.  Continue guideline directed medications as prescribed by his cardiologist.  5. Paroxysmal atrial fibrillation ([place]) Continue Xarelto, carvedilol and amiodarone as directed by cardiology  6. CKD (chronic kidney disease) stage 4, GFR 15-29 ml/min (HCC) Encouraged him to give Dr. Keturah Barre office to call and try to get back in.  His kidney function  fluctuates between CKD 3-4 as fluid status is optimized given history of CHF.  7. COPD with asthma ([place]) - albuterol (PROVENTIL) (2.5 MG/3ML) 0.083% nebulizer solution; Take 3 mLs (2.5 mg total) by nebulization every 6 (six) hours as needed for wheezing or shortness of breath.  Dispense: 150 mL; Refill: 6  8. Influenza vaccination declined Commended.  Patient declined.  9. Need for vaccination against Streptococcus pneumoniae Given PCV 23 today    Patient was given the opportunity to ask questions.  Patient verbalized understanding of the plan and was able to repeat key elements of the plan.   Orders Placed This Encounter  Procedures   Pneumococcal polysaccharide vaccine 23-valent greater than or equal to 2yo subcutaneous/IM     Requested Prescriptions   Signed Prescriptions Disp Refills   albuterol (PROVENTIL) (2.5 MG/3ML) 0.083% nebulizer solution 150 mL 6    Sig: Take 3 mLs (2.5 mg total) by nebulization every 6 (six) hours as needed for wheezing or shortness of breath.    Return in about 4 months (around 08/11/2021).  Karle Plumber, MD, FACP

## 2021-04-12 NOTE — Telephone Encounter (Signed)
LVM for pt relaying AL,NP message. Advised him to reach out to DME about mask refit if they have not already. Reminded him to keep next f/u on 08/17/21 at 2:30pm. Advised him to call back if he has any other questions/concerns.

## 2021-04-14 ENCOUNTER — Telehealth: Payer: Self-pay

## 2021-04-14 NOTE — Progress Notes (Signed)
EPIC Encounter for ICM Monitoring  Patient Name: Cody Ortega is a 60 y.o. male Date: 04/14/2021 Primary Care Physican: Ladell Pier, MD Primary Cardiologist: Burt Knack Electrophysiologist: Lovena Le 04/08/2021 Office Weight: 280 lbs        1st ICM Remote Transmission.  Attempted call to patient and unable to reach.  Left detailed message per DPR regarding transmission. Transmission reviewed.    Optivol thoracic impedance suggesting possible fluid accumulation starting 12/8 and returned to baseline 12/12.  Impedance also decreased from 11/21-11/26.   Prescribed:  Furosemide 40 mg take 0.5 tablet (20 mg total) by mouth daily.  Labs: 01/20/2021 Creatinine 2.66, BUN 27, Potassium 4.0, Sodium 137, GFR 27 01/01/2021 Creatinine 2.95, BUN 40, Potassium 3.8, Sodium 139, GFR 24  12/24/2020 Creatinine 2.72, BUN 32, Potassium 4.8, Sodium 139, GFR 26  A complete set of results can be found in Results Review.  Recommendations: Left voice mail with ICM number and encouraged to call if experiencing any fluid symptoms.  Follow-up plan: ICM clinic phone appointment on 05/17/2021.   91 day device clinic remote transmission 04/16/2022.    EP/Cardiology Office Visits: 05/07/2021 with Dr. Lovena Le.  Last visit with Dr Burt Knack was 11/04/2020 and no recall.  Copy of ICM check sent to Dr. Lovena Le.   3 month ICM trend: 04/12/2021.    12-14 Month ICM trend:       Rosalene Billings, RN 04/14/2021 2:14 PM

## 2021-04-14 NOTE — Progress Notes (Signed)
CM sent to [facility]

## 2021-04-14 NOTE — Telephone Encounter (Signed)
Remote ICM transmission received.  Attempted call to patient regarding ICM remote transmission and left detailed message per DPR.  Advised to return call for any fluid symptoms or questions. Next ICM remote transmission scheduled 05/17/2021.

## 2021-04-16 ENCOUNTER — Ambulatory Visit (INDEPENDENT_AMBULATORY_CARE_PROVIDER_SITE_OTHER): Payer: Medicaid Other

## 2021-04-16 DIAGNOSIS — I495 Sick sinus syndrome: Secondary | ICD-10-CM

## 2021-04-16 LAB — CUP PACEART REMOTE DEVICE CHECK
Battery Remaining Longevity: 131 mo
Battery Voltage: 3.02 V
Brady Statistic RV Percent Paced: 1.88 %
Date Time Interrogation Session: 20221216052608
HighPow Impedance: 45 Ohm
HighPow Impedance: 60 Ohm
Implantable Lead Implant Date: 20120806
Implantable Lead Location: 753860
Implantable Lead Model: 185
Implantable Lead Serial Number: 345870
Implantable Pulse Generator Implant Date: 20210917
Lead Channel Impedance Value: 323 Ohm
Lead Channel Impedance Value: 323 Ohm
Lead Channel Pacing Threshold Amplitude: 1.125 V
Lead Channel Pacing Threshold Pulse Width: 0.4 ms
Lead Channel Sensing Intrinsic Amplitude: 7 mV
Lead Channel Sensing Intrinsic Amplitude: 7 mV
Lead Channel Setting Pacing Amplitude: 2.75 V
Lead Channel Setting Pacing Pulse Width: 0.4 ms
Lead Channel Setting Sensing Sensitivity: 0.3 mV

## 2021-04-26 ENCOUNTER — Other Ambulatory Visit: Payer: Self-pay | Admitting: Internal Medicine

## 2021-04-28 NOTE — Progress Notes (Signed)
Remote ICD transmission.   

## 2021-05-01 DIAGNOSIS — G4733 Obstructive sleep apnea (adult) (pediatric): Secondary | ICD-10-CM | POA: Diagnosis not present

## 2021-05-01 DIAGNOSIS — J690 Pneumonitis due to inhalation of food and vomit: Secondary | ICD-10-CM | POA: Diagnosis not present

## 2021-05-01 DIAGNOSIS — J9601 Acute respiratory failure with hypoxia: Secondary | ICD-10-CM | POA: Diagnosis not present

## 2021-05-01 DIAGNOSIS — I5020 Unspecified systolic (congestive) heart failure: Secondary | ICD-10-CM | POA: Diagnosis not present

## 2021-05-02 DIAGNOSIS — J9601 Acute respiratory failure with hypoxia: Secondary | ICD-10-CM | POA: Diagnosis not present

## 2021-05-02 DIAGNOSIS — I5020 Unspecified systolic (congestive) heart failure: Secondary | ICD-10-CM | POA: Diagnosis not present

## 2021-05-02 DIAGNOSIS — J690 Pneumonitis due to inhalation of food and vomit: Secondary | ICD-10-CM | POA: Diagnosis not present

## 2021-05-03 ENCOUNTER — Other Ambulatory Visit: Payer: Self-pay | Admitting: Internal Medicine

## 2021-05-03 DIAGNOSIS — N400 Enlarged prostate without lower urinary tract symptoms: Secondary | ICD-10-CM

## 2021-05-07 ENCOUNTER — Ambulatory Visit (INDEPENDENT_AMBULATORY_CARE_PROVIDER_SITE_OTHER): Payer: Medicaid Other | Admitting: Internal Medicine

## 2021-05-07 ENCOUNTER — Encounter: Payer: Self-pay | Admitting: Internal Medicine

## 2021-05-07 ENCOUNTER — Other Ambulatory Visit: Payer: Self-pay

## 2021-05-07 VITALS — BP 142/86 | HR 55 | Ht 75.0 in | Wt 269.0 lb

## 2021-05-07 DIAGNOSIS — Z95810 Presence of automatic (implantable) cardiac defibrillator: Secondary | ICD-10-CM | POA: Diagnosis not present

## 2021-05-07 DIAGNOSIS — I480 Paroxysmal atrial fibrillation: Secondary | ICD-10-CM | POA: Diagnosis not present

## 2021-05-07 DIAGNOSIS — I5022 Chronic systolic (congestive) heart failure: Secondary | ICD-10-CM | POA: Diagnosis not present

## 2021-05-07 NOTE — Patient Instructions (Addendum)
Medication Instructions:  Your physician recommends that you continue on your current medications as directed. Please refer to the Current Medication list given to you today.  Dr. Lovena Le recommends you see Dr. Allegra Lai to discuss possible atrial fibrillation ablation.  Labwork: None ordered.  Testing/Procedures: None ordered.  Follow-Up: Your physician wants you to follow-up in: one year with Cristopher Peru, MD or one of the following Advanced Practice Providers on your designated Care Team:   Tommye Standard, [place] Legrand Como "Jonni Sanger" Chalmers Cater, [place]  Remote monitoring is used to monitor your ICD from home. This monitoring reduces the number of office visits required to check your device to one time per year. It allows [place] to keep an eye on the functioning of your device to ensure it is working properly. You are scheduled for a device check from home on 07/16/2021. You may send your transmission at any time that day. If you have a wireless device, the transmission will be sent automatically. After your physician reviews your transmission, you will receive a postcard with your next transmission date.  Any Other Special Instructions Will Be Listed Below (If Applicable).  If you need a refill on your cardiac medications before your next appointment, please call your pharmacy.   Cardiac Ablation Cardiac ablation is a procedure to destroy, or ablate, a small amount of heart tissue in very specific places. The heart has many electrical connections. Sometimes these connections are abnormal and can cause the heart to beat very fast or irregularly. Ablating some of the areas that cause problems can improve the heart's rhythm or return it to normal. Ablation may be done for people who: Have Wolff-Parkinson-White syndrome. Have fast heart rhythms (tachycardia). Have taken medicines for an abnormal heart rhythm (arrhythmia) that were not effective or caused side effects. Have a high-risk heartbeat that may be  life-threatening. During the procedure, a small incision is made in the neck or the groin, and a long, thin tube (catheter) is inserted into the incision and moved to the heart. Small devices (electrodes) on the tip of the catheter will send out electrical currents. A type of X-ray (fluoroscopy) will be used to help guide the catheter and to provide images of the heart. Tell a health care provider about: Any allergies you have. All medicines you are taking, including vitamins, herbs, eye drops, creams, and over-the-counter medicines. Any problems you or family members have had with anesthetic medicines. Any blood disorders you have. Any surgeries you have had. Any medical conditions you have, such as kidney failure. Whether you are pregnant or may be pregnant. What are the risks? Generally, this is a safe procedure. However, problems may occur, including: Infection. Bruising and bleeding at the catheter insertion site. Bleeding into the chest, especially into the sac that surrounds the heart. This is a serious complication. Stroke or blood clots. Damage to nearby structures or organs. Allergic reaction to medicines or dyes. Need for a permanent pacemaker if the normal electrical system is damaged. A pacemaker is a small computer that sends electrical signals to the heart and helps your heart beat normally. The procedure not being fully effective. This may not be recognized until months later. Repeat ablation procedures are sometimes done. What happens before the procedure? Medicines Ask your health care provider about: Changing or stopping your regular medicines. This is especially important if you are taking diabetes medicines or blood thinners. Taking medicines such as aspirin and ibuprofen. These medicines can thin your blood. Do not take these medicines unless  your health care provider tells you to take them. Taking over-the-counter medicines, vitamins, herbs, and supplements. General  instructions Follow instructions from your health care provider about eating or drinking restrictions. Plan to have someone take you home from the hospital or clinic. If you will be going home right after the procedure, plan to have someone with you for 24 hours. Ask your health care provider what steps will be taken to prevent infection. What happens during the procedure?  An IV will be inserted into one of your veins. You will be given a medicine to help you relax (sedative). The skin on your neck or groin will be numbed. An incision will be made in your neck or your groin. A needle will be inserted through the incision and into a large vein in your neck or groin. A catheter will be inserted into the needle and moved to your heart. Dye may be injected through the catheter to help your surgeon see the area of the heart that needs treatment. Electrical currents will be sent from the catheter to ablate heart tissue in desired areas. There are three types of energy that may be used to do this: Heat (radiofrequency energy). Laser energy. Extreme cold (cryoablation). When the tissue has been ablated, the catheter will be removed. Pressure will be held on the insertion area to prevent a lot of bleeding. A bandage (dressing) will be placed over the insertion area. The exact procedure may vary among health care providers and hospitals. What happens after the procedure? Your blood pressure, heart rate, breathing rate, and blood oxygen level will be monitored until you leave the hospital or clinic. Your insertion area will be monitored for bleeding. You will need to lie still for a few hours to ensure that you do not bleed from the insertion area. Do not drive for 24 hours or as long as told by your health care provider. Summary Cardiac ablation is a procedure to destroy, or ablate, a small amount of heart tissue using an electrical current. This procedure can improve the heart rhythm or return it  to normal. Tell your health care provider about any medical conditions you may have and all medicines you are taking to treat them. This is a safe procedure, but problems may occur. Problems may include infection, bruising, damage to nearby organs or structures, or allergic reactions to medicines. Follow your health care provider's instructions about eating and drinking before the procedure. You may also be told to change or stop some of your medicines. After the procedure, do not drive for 24 hours or as long as told by your health care provider. This information is not intended to replace advice given to you by your health care provider. Make sure you discuss any questions you have with your health care provider. Document Revised: 02/25/2019 Document Reviewed: 02/25/2019 Elsevier Patient Education  [place].

## 2021-05-07 NOTE — Progress Notes (Signed)
HPI Mr. Warren returns today for followup. He is a pleasant 61 yo man with a h/o chronic systolic heart failure, s/p ICD insertion who underwent ICD gen change out several months ago. He has a h/o Hep B with normal LFT's. He has been on amiodarone for atrial fib for which he is very symptomatic. Fortunately he has maintained NSR.  Allergies  Allergen Reactions   Prochlorperazine Other (See Comments)    Medication caused a lot of issues like blurred vision, nausea, pain, and carpool turnel   Codeine Itching, Nausea And Vomiting and Hives   Hydrocodone-Acetaminophen Itching and Nausea And Vomiting     Current Outpatient Medications  Medication Sig Dispense Refill   acetaminophen (TYLENOL) 500 MG tablet Take 1,000 mg by mouth every 6 (six) hours as needed for moderate pain or headache.     albuterol (PROVENTIL) (2.5 MG/3ML) 0.083% nebulizer solution Take 3 mLs (2.5 mg total) by nebulization every 6 (six) hours as needed for wheezing or shortness of breath. 150 mL 6   albuterol (VENTOLIN HFA) 108 (90 Base) MCG/ACT inhaler INHALE 2 PUFFS INTO THE LUNGS EVERY 6 HOURS AS NEEDED FOR WHEEZING OR SHORTNESS OF BREATH 8.5 g 6   alprazolam (XANAX) 2 MG tablet Take 2 mg by mouth every 6 (six) hours as needed for anxiety.     amiodarone (PACERONE) 200 MG tablet Take 1 tablet (200 mg total) by mouth daily. 180 tablet 3   atorvastatin (LIPITOR) 10 MG tablet Take 1 tablet (10 mg total) by mouth daily. 30 tablet 11   carvedilol (COREG) 12.5 MG tablet TAKE 1 TABLET(12.5 MG) BY MOUTH TWICE DAILY WITH A MEAL 180 tablet 3   dapagliflozin propanediol (FARXIGA) 10 MG TABS tablet Take 1 tablet (10 mg total) by mouth daily before breakfast. 30 tablet 5   Dextran 70-Hypromellose, PF, ([employer] NATURAL TEARS) 0.1-0.3 % SOLN Place 1 drop into both eyes daily as needed (dry eyes).     DULoxetine (CYMBALTA) 30 MG capsule Take 90 mg by mouth daily.      entecavir (BARACLUDE) 0.5 MG tablet Take 1 tablet (0.5 mg total) by  mouth every other day. Please schedule an office visit for further refills. 45 tablet 0   ezetimibe (ZETIA) 10 MG tablet TAKE 1 TABLET(10 MG) BY MOUTH DAILY 90 tablet 2   ferrous sulfate 325 (65 FE) MG tablet Take 325 mg by mouth daily with breakfast.     fluticasone (FLONASE) 50 MCG/ACT nasal spray SHAKE LIQUID AND USE 1 SPRAY IN EACH NOSTRIL DAILY 16 g 2   furosemide (LASIX) 40 MG tablet Take 20 mg by mouth daily.     hydrALAZINE (APRESOLINE) 50 MG tablet TAKE 1 TABLET(50 MG) BY MOUTH TWICE DAILY 180 tablet 0   ondansetron (ZOFRAN-ODT) 8 MG disintegrating tablet Take 8 mg by mouth 2 (two) times daily as needed for nausea or vomiting.     oxybutynin (DITROPAN) 5 MG tablet TAKE 1 TABLET(5 MG) BY MOUTH THREE TIMES DAILY 90 tablet 2   polyethylene glycol (MIRALAX) 17 g packet Take as directed, Daily, titrate as needed 14 each 0   sacubitril-valsartan (ENTRESTO) 49-51 MG Take 1 tablet by mouth 2 (two) times daily. 60 tablet 11   sildenafil (REVATIO) 20 MG tablet Take 2-5 tablets by mouth once daily as needed prior to sexual activity. 100 tablet 3   SYMBICORT 160-4.5 MCG/ACT inhaler INHALE 2 PUFFS INTO THE LUNGS TWICE DAILY 10.2 g 2   tamsulosin (FLOMAX) 0.4 MG  CAPS capsule TAKE 1 CAPSULE(0.4 MG) BY MOUTH DAILY 90 capsule 1   XARELTO 20 MG TABS tablet TAKE 1 TABLET(20 MG) BY MOUTH DAILY WITH SUPPER 90 tablet 1   No current facility-administered medications for this visit.     Past Medical History:  Diagnosis Date   Anxiety    Arthritis    Benzodiazepine dependence (HCC)    Bronchial asthma    CAD (coronary artery disease)    a. Cath 03/2010: mod RCA stenosis, severe diag stenosis, treated medically given lack of angina.   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 Episode in 2008 with spontaneous conversion, on amiodarone per EP.   S/P implantation of automatic cardioverter/defibrillator (AICD)    a. Medtronic, implanted 2012.   Sleep apnea    Stroke [facility]) 2004   Thyroid disease     ROS:   All systems reviewed and negative except as noted in the HPI.   Past Surgical History:  Procedure Laterality Date   CARDIOVERSION N/A 01/07/2014   Procedure: CARDIOVERSION;  Surgeon: Lelon Perla, MD;  Location: [facility] ENDOSCOPY;  Service: Cardiovascular;  Laterality: N/A;   CARDIOVERSION N/A 02/11/2014   Procedure: CARDIOVERSION;  Surgeon: Pixie Casino, MD;  Location: [facility] ENDOSCOPY;  Service: Cardiovascular;  Laterality: N/A;   CARDIOVERSION N/A 10/01/2020   Procedure: CARDIOVERSION;  Surgeon: Werner Lean, MD;  Location: [facility] ENDOSCOPY;  Service: Cardiovascular;  Laterality: N/A;   CATARACT EXTRACTION     COLONOSCOPY WITH PROPOFOL N/A 02/06/2018   Procedure: COLONOSCOPY WITH PROPOFOL;  Surgeon: Yetta Flock, MD;  Location: [facility] ENDOSCOPY;  Service: Gastroenterology;  Laterality: N/A;   EYE SURGERY     ICD GENERATOR CHANGEOUT N/A 01/17/2020   Procedure: ICD GENERATOR CHANGEOUT;  Surgeon: Evans Lance, MD;  Location: [place] CV LAB;  Service: Cardiovascular;  Laterality: N/A;   LASEK eye surgery Bilateral 05/2018   LASIK Bilateral    PACEMAKER INSERTION     POLYPECTOMY  02/06/2018   Procedure: POLYPECTOMY;  Surgeon: Yetta Flock, MD;  Location: [facility] ENDOSCOPY;  Service: Gastroenterology;;   TEE WITHOUT CARDIOVERSION N/A 01/07/2014   Procedure: TRANSESOPHAGEAL ECHOCARDIOGRAM (TEE);  Surgeon: Lelon Perla, MD;  Location: [facility] ENDOSCOPY;  Service: Cardiovascular;  Laterality: N/A;   TEE  WITHOUT CARDIOVERSION N/A 02/11/2014   Procedure: TRANSESOPHAGEAL ECHOCARDIOGRAM (TEE);  Surgeon: Pixie Casino, MD;  Location: [facility] ENDOSCOPY;  Service: Cardiovascular;  Laterality: N/A;   TEE WITHOUT CARDIOVERSION N/A 10/01/2020   Procedure: TRANSESOPHAGEAL ECHOCARDIOGRAM (TEE);  Surgeon: Werner Lean, MD;  Location: [facility] ENDOSCOPY;  Service: Cardiovascular;  Laterality: N/A;   TONSILLECTOMY     TRANSTHORACIC ECHOCARDIOGRAM  10/2006, 12/2009     Family History  Problem Relation Age of Onset   Breast cancer Mother    Multiple myeloma Mother    Prostate cancer Father    Diabetes Father    Peripheral vascular disease Father  Cerebral palsy Daughter    Lung disease Neg Hx    Colon cancer Neg Hx    Stomach cancer Neg Hx    Esophageal cancer Neg Hx    Colon polyps Neg Hx      Social History   Socioeconomic History   Marital status: Divorced    Spouse name: Not on file   Number of children: 2   Years of education: Not on file   Highest education level: Not on file  Occupational History   Occupation: Unemployed    Employer: UNEMPLOYED    Comment: basketball referee in the past  Tobacco Use   Smoking status: Former    Packs/day: 0.50    Years: 0.50    Pack years: 0.25    Types: Cigarettes    Quit date: 05/02/2010    Years since quitting: 11.0   Smokeless tobacco: Never   Tobacco comments:    Significant Second-hand and only 3 months himself  Vaping Use   Vaping Use: Never used  Substance and Sexual Activity   Alcohol use: Not Currently    Alcohol/week: 0.0 standard drinks    Comment: rare   Drug use: No    Comment: Reported history of cocaine abuse off and on    Sexual activity: Not on file  Other Topics Concern   Not on file  Social History Narrative   Living with a son who is a 80 year old.  He is not     working, unemployed for 3 years.  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  Denied use of alcohol.  History of cocaine   abuse on and off reported.       [facility]:   Originally from [place], [place]. He grew up in [place]. He moved to [place] in 1985. 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 No pets currently. No bird exposure. He did have mold in a previous home. No hot tub exposure.         Social Determinants of Health   Financial Resource Strain: Not on file  Food Insecurity: Not on file  Transportation Needs: Not on file  Physical Activity: Not on file  Stress: Not on file  Social Connections: Not on file  Intimate Partner Violence: Not on file     BP (!) 142/86    Pulse (!) 55    Ht 6' 3" (1.905 m)    Wt 269 lb (122 kg)    SpO2 98%    BMI 33.62 kg/m   Physical Exam:  Well appearing NAD HEENT: Unremarkable Neck:  No JVD, no thyromegally Lymphatics:  No adenopathy Back:  No CVA tenderness Lungs:  Clear HEART:  Regular rate rhythm, no murmurs, no rubs, no clicks Abd:  soft, positive bowel sounds, no organomegally, no rebound, no guarding Ext:  2 plus pulses, no edema, no cyanosis, no clubbing Skin:  No rashes no nodules Neuro:  CN II through XII intact, motor grossly intact  EKG - sinus bradycardia  DEVICE  Normal device function.  See PaceArt for details.   Assess/Plan:   1. Chronic systolic heart failure - his symptoms are class 2. He is encouraged to avoid salty foods, lose weight and maintain a low fat diet. 2. Obesity - he had been down to 250 and I encouraged h His weight is up almost 20 lbs.  3. Atrial fib - he is maintaining NSR. I  have asked him to continue his amiodarone and I have asked him to consult with Dr. Curt Bears to discuss atrial fib ablation to try and get off his amiodarone. 4. ICD - his medtronic single chamber ICD is working normally.   Carleene Overlie ,MD

## 2021-05-11 LAB — CUP PACEART INCLINIC DEVICE CHECK
Battery Remaining Longevity: 130 mo
Battery Voltage: 3.03 V
Brady Statistic RV Percent Paced: 0.74 %
Date Time Interrogation Session: 20230106170100
HighPow Impedance: 49 Ohm
HighPow Impedance: 70 Ohm
Implantable Lead Implant Date: 20120806
Implantable Lead Location: 753860
Implantable Lead Model: 185
Implantable Lead Serial Number: 345870
Implantable Pulse Generator Implant Date: 20210917
Lead Channel Impedance Value: 323 Ohm
Lead Channel Impedance Value: 323 Ohm
Lead Channel Pacing Threshold Amplitude: 1.375 V
Lead Channel Pacing Threshold Pulse Width: 0.4 ms
Lead Channel Sensing Intrinsic Amplitude: 11.75 mV
Lead Channel Sensing Intrinsic Amplitude: 6.5 mV
Lead Channel Setting Pacing Amplitude: 3 V
Lead Channel Setting Pacing Pulse Width: 0.4 ms
Lead Channel Setting Sensing Sensitivity: 0.3 mV

## 2021-05-19 NOTE — Progress Notes (Signed)
No ICM remote transmission received for 05/18/2021 and next ICM transmission scheduled for 06/01/2021.

## 2021-05-24 ENCOUNTER — Encounter: Payer: Self-pay | Admitting: Internal Medicine

## 2021-06-01 ENCOUNTER — Ambulatory Visit: Payer: Medicaid Other

## 2021-06-01 ENCOUNTER — Telehealth: Payer: Self-pay

## 2021-06-01 DIAGNOSIS — I5022 Chronic systolic (congestive) heart failure: Secondary | ICD-10-CM

## 2021-06-01 DIAGNOSIS — I5020 Unspecified systolic (congestive) heart failure: Secondary | ICD-10-CM | POA: Diagnosis not present

## 2021-06-01 DIAGNOSIS — J690 Pneumonitis due to inhalation of food and vomit: Secondary | ICD-10-CM | POA: Diagnosis not present

## 2021-06-01 DIAGNOSIS — J9601 Acute respiratory failure with hypoxia: Secondary | ICD-10-CM | POA: Diagnosis not present

## 2021-06-01 DIAGNOSIS — Z95810 Presence of automatic (implantable) cardiac defibrillator: Secondary | ICD-10-CM

## 2021-06-01 DIAGNOSIS — G4733 Obstructive sleep apnea (adult) (pediatric): Secondary | ICD-10-CM | POA: Diagnosis not present

## 2021-06-01 NOTE — Telephone Encounter (Signed)
Attempted call to patient and unable to reach.  Left message requesting to send remote transmission to check fluid levels.

## 2021-06-02 DIAGNOSIS — I5020 Unspecified systolic (congestive) heart failure: Secondary | ICD-10-CM | POA: Diagnosis not present

## 2021-06-02 DIAGNOSIS — J9601 Acute respiratory failure with hypoxia: Secondary | ICD-10-CM | POA: Diagnosis not present

## 2021-06-02 DIAGNOSIS — J690 Pneumonitis due to inhalation of food and vomit: Secondary | ICD-10-CM | POA: Diagnosis not present

## 2021-06-04 ENCOUNTER — Ambulatory Visit: Payer: Medicaid Other | Attending: Internal Medicine | Admitting: Internal Medicine

## 2021-06-04 ENCOUNTER — Other Ambulatory Visit: Payer: Self-pay

## 2021-06-04 ENCOUNTER — Encounter: Payer: Self-pay | Admitting: Internal Medicine

## 2021-06-04 ENCOUNTER — Telehealth: Payer: Self-pay

## 2021-06-04 VITALS — BP 152/85 | HR 62 | Resp 16 | Ht 75.0 in | Wt 273.0 lb

## 2021-06-04 DIAGNOSIS — Z6379 Other stressful life events affecting family and household: Secondary | ICD-10-CM

## 2021-06-04 DIAGNOSIS — I10 Essential (primary) hypertension: Secondary | ICD-10-CM

## 2021-06-04 DIAGNOSIS — G4733 Obstructive sleep apnea (adult) (pediatric): Secondary | ICD-10-CM | POA: Diagnosis not present

## 2021-06-04 DIAGNOSIS — Z9989 Dependence on other enabling machines and devices: Secondary | ICD-10-CM

## 2021-06-04 NOTE — Progress Notes (Signed)
Concerns about loosing oxygen. Needs HS for CPAP With [employer]  Concerns for son increasing stress and anxiety.   Heart ablation coming up with Cards

## 2021-06-04 NOTE — Progress Notes (Signed)
EPIC Encounter for ICM Monitoring  Patient Name: Cody Ortega is a 61 y.o. male Date: 06/04/2021 Primary Care Physican: Ladell Pier, MD Primary Cardiologist: Burt Knack Electrophysiologist: Lovena Le 04/08/2021 Office Weight: 280 lbs                                                             Attempted call to patient and unable to reach.  Left detailed message per DPR regarding transmission. Transmission reviewed.    Optivol thoracic impedance suggesting possible fluid accumulation starting 1/23.     Prescribed:  Furosemide 40 mg take 0.5 tablet (20 mg total) by mouth daily.   Labs: 01/20/2021 Creatinine 2.66, BUN 27, Potassium 4.0, Sodium 137, GFR 27 01/01/2021 Creatinine 2.95, BUN 40, Potassium 3.8, Sodium 139, GFR 24  12/24/2020 Creatinine 2.72, BUN 32, Potassium 4.8, Sodium 139, GFR 26  A complete set of results can be found in Results Review.   Recommendations: Left voice mail with ICM number and encouraged to call if experiencing any fluid symptoms.   Follow-up plan: ICM clinic phone appointment on 06/09/2021 to recheck fluid levels.   91 day device clinic remote transmission 07/16/2021.     EP/Cardiology Office Visits:   08/05/2021 with Dr Curt Bears to discuss afib ablation.   Last visit with Dr Burt Knack was 11/04/2020 and no recall. Recall 05/04/2022 with Tommye Standard, PA or Oda Kilts, [place].   Copy of ICM check sent to Dr. Lovena Le.  Will send to Dr Burt Knack for review if patient is reached.  3 month ICM trend: 06/03/2021.    12-14 Month ICM trend:     Rosalene Billings, RN 06/04/2021 12:37 PM

## 2021-06-04 NOTE — Telephone Encounter (Signed)
Remote ICM transmission received.  Attempted call to patient regarding ICM remote transmission and left detailed message per DPR.  Advised to return call for any fluid symptoms or questions. Next ICM remote transmission scheduled 06/09/2021.

## 2021-06-04 NOTE — Progress Notes (Signed)
Patient ID: Cody Ortega, male    DOB: [birth year]  MRN: [id number]  CC: Evaluation for continued oxygen need  Subjective: Cody Ortega is a 61 y.o. male who presents for UC visit His concerns today include:  The patient with history of COPD and asthma, HTN, CKD stage III (Dr. Candiss Norse), CAD, and NICM with chronic systolic CHF (EF 48-88% 12/1692) and ICD, PAF, immune active hep B, adenomatous polyps, dep/anx (followed by psychiatrist, Noemi Chapel), OSA (intolerant of CPAP mask), hypoxic resp failure on O2 (mainly at nights now with CPAP).   Patient tells me he was called by [facility] several days ago.  He was told that he needs to be reassessed to see whether he still needs oxygen.  He was sent home with O2 continuous 09/2020 after being hospitalized with hypoxic respiratory failure secondary to aspiration pneumonia.  Patient now uses the O2 only with increased exertion like when he is pulling his trash can up from the street.  After he is done he has to use the oxygen for several minutes.  He also had sleep study done in July of last year that revealed hypoxia.  The neurologist who read the sleep study stated that he should use his oxygen to bleed into the CPAP machine at nights.  He uses the oxygen at nights with his CPAP.  He tells me that he also uses the oxygen when he does a nebulizer treatment.  He does not have a nebulizer machine.  Patient is a little upset today because he found out that his son is back on heroin and using Fentanyl.   11 puppy for christmas Dog scratches his arm at times.  He has been using peroxide to clean areas whenever he is scratched on his arms.  Blood pressure noted to be elevated today.  However patient states that he has been checking his blood pressure at home regularly.  He has been running in the 120s over 80.  He thinks it is elevated today because he is upset about his son.  He has taken his medicines already for today. Patient Active Problem List    Diagnosis Date Noted   Chronic hypoxemic respiratory failure ([place]) 12/12/2020   Secondary hypercoagulable state ([place]) 10/14/2020   Acute respiratory failure with hypoxia ([facility]) 10/03/2020   Aspiration pneumonia of both lower lobes due to gastric secretions ([place]) 10/03/2020   Aspiration pneumonia ([place]) 10/03/2020   HFrEF (heart failure with reduced ejection fraction) ([place])    Stage 3b chronic kidney disease ([place]) 09/05/2019   BPH (benign prostatic hyperplasia) 06/19/2019   Dental abscess 04/30/2018   Adenomatous polyp of colon 02/09/2018   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 agent with hepatic coma 02/09/2018   Osteoarthritis of both knees 06/12/2017   COPD with asthma ([place]) 03/17/2016   Chronic seasonal allergic rhinitis 03/17/2016   GERD (gastroesophageal reflux disease) 03/17/2016   Chronic pain syndrome 03/16/2015   Obstructive sleep apnea 03/16/2015   Chronic low back pain with right-sided sciatica 03/16/2015   Sinus brady-tachy syndrome ([place]) 02/20/2014   Atrial fibrillation ([place]) 12/31/2013   Automatic implantable cardioverter-defibrillator in situ 03/08/2011   Erectile dysfunction 02/28/2011   CAD, NATIVE VESSEL 04/26/2010   PURE HYPERCHOLESTEROLEMIA 04/12/2010   HLD (hyperlipidemia) 01/11/2010   Essential hypertension, malignant 50/38/8828   Chronic systolic heart failure ([place]) 12/18/2009     Current Outpatient Medications on File Prior to Visit  Medication Sig Dispense Refill   acetaminophen (TYLENOL) 500 MG tablet Take 1,000 mg by  mouth every 6 (six) hours as needed for moderate pain or headache.     albuterol (PROVENTIL) (2.5 MG/3ML) 0.083% nebulizer solution Take 3 mLs (2.5 mg total) by nebulization every 6 (six) hours as needed for wheezing or shortness of breath. 150 mL 6   albuterol (VENTOLIN HFA) 108 (90 Base) MCG/ACT inhaler INHALE 2 PUFFS INTO THE LUNGS EVERY 6 HOURS AS NEEDED FOR WHEEZING OR SHORTNESS OF BREATH 8.5 g 6   alprazolam (XANAX) 2 MG tablet Take 2 mg by  mouth every 6 (six) hours as needed for anxiety.     amiodarone (PACERONE) 200 MG tablet Take 1 tablet (200 mg total) by mouth daily. 180 tablet 3   atorvastatin (LIPITOR) 10 MG tablet Take 1 tablet (10 mg total) by mouth daily. 30 tablet 11   carvedilol (COREG) 12.5 MG tablet TAKE 1 TABLET(12.5 MG) BY MOUTH TWICE DAILY WITH A MEAL 180 tablet 3   dapagliflozin propanediol (FARXIGA) 10 MG TABS tablet Take 1 tablet (10 mg total) by mouth daily before breakfast. 30 tablet 5   Dextran 70-Hypromellose, PF, ([employer] NATURAL TEARS) 0.1-0.3 % SOLN Place 1 drop into both eyes daily as needed (dry eyes).     DULoxetine (CYMBALTA) 30 MG capsule Take 90 mg by mouth daily.      entecavir (BARACLUDE) 0.5 MG tablet Take 1 tablet (0.5 mg total) by mouth every other day. Please schedule an office visit for further refills. 45 tablet 0   ezetimibe (ZETIA) 10 MG tablet TAKE 1 TABLET(10 MG) BY MOUTH DAILY 90 tablet 2   ferrous sulfate 325 (65 FE) MG tablet Take 325 mg by mouth daily with breakfast.     fluticasone (FLONASE) 50 MCG/ACT nasal spray SHAKE LIQUID AND USE 1 SPRAY IN EACH NOSTRIL DAILY 16 g 2   furosemide (LASIX) 40 MG tablet Take 20 mg by mouth daily.     hydrALAZINE (APRESOLINE) 50 MG tablet TAKE 1 TABLET(50 MG) BY MOUTH TWICE DAILY 180 tablet 0   ondansetron (ZOFRAN-ODT) 8 MG disintegrating tablet Take 8 mg by mouth 2 (two) times daily as needed for nausea or vomiting.     oxybutynin (DITROPAN) 5 MG tablet TAKE 1 TABLET(5 MG) BY MOUTH THREE TIMES DAILY 90 tablet 2   polyethylene glycol (MIRALAX) 17 g packet Take as directed, Daily, titrate as needed 14 each 0   sacubitril-valsartan (ENTRESTO) 49-51 MG Take 1 tablet by mouth 2 (two) times daily. 60 tablet 11   sildenafil (REVATIO) 20 MG tablet Take 2-5 tablets by mouth once daily as needed prior to sexual activity. 100 tablet 3   SYMBICORT 160-4.5 MCG/ACT inhaler INHALE 2 PUFFS INTO THE LUNGS TWICE DAILY 10.2 g 2   tamsulosin (FLOMAX) 0.4 MG CAPS capsule  TAKE 1 CAPSULE(0.4 MG) BY MOUTH DAILY 90 capsule 1   XARELTO 20 MG TABS tablet TAKE 1 TABLET(20 MG) BY MOUTH DAILY WITH SUPPER 90 tablet 1   No current facility-administered medications on file prior to visit.    Allergies  Allergen Reactions   Prochlorperazine Other (See Comments)    Medication caused a lot of issues like blurred vision, nausea, pain, and carpool turnel   Codeine Itching, Nausea And Vomiting and Hives   Hydrocodone-Acetaminophen Itching and Nausea And Vomiting    Social History   Socioeconomic History   Marital status: Divorced    Spouse name: Not on file   Number of children: 2   Years of education: Not on file   Highest education level: Not on file  Occupational History   Occupation: Merchandiser, retail: UNEMPLOYED    Comment: Writer in the past  Tobacco Use   Smoking status: Former    Packs/day: 0.50    Years: 0.50    Pack years: 0.25    Types: Cigarettes    Quit date: 05/02/2010    Years since quitting: 11.0   Smokeless tobacco: Never   Tobacco comments:    Significant Second-hand and only 3 months himself  Vaping Use   Vaping Use: Never used  Substance and Sexual Activity   Alcohol use: Not Currently    Alcohol/week: 0.0 standard drinks    Comment: rare   Drug use: No    Comment: Reported history of cocaine abuse off and on    Sexual activity: Not on file  Other Topics Concern   Not on file  Social History Narrative   Living with a son who is a 65 year old.  He is not     working, unemployed for 3 years.  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  Denied use of alcohol.  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       [facility]:   Originally from [place], [place]. He grew up in [place]. He moved to [place] in [place]. 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 No pets currently. No bird exposure. He did have mold in a previous home. No hot tub exposure.          Social Determinants of Health   Financial Resource Strain: Not on file  Food Insecurity: Not on file  Transportation Needs: Not on file  Physical Activity: Not on file  Stress: Not on file  Social Connections: Not on file  Intimate Partner Violence: Not on file    Family History  Problem Relation Age of Onset   Breast cancer Mother    Multiple myeloma Mother    Prostate cancer Father    Diabetes Father    Peripheral vascular disease Father    Cerebral palsy Daughter    Lung disease Neg Hx    Colon cancer Neg Hx    Stomach cancer Neg Hx    Esophageal cancer Neg Hx    Colon polyps Neg Hx     Past Surgical History:  Procedure Laterality Date   CARDIOVERSION N/A 01/07/2014   Procedure: CARDIOVERSION;  Surgeon: Lelon Perla, MD;  Location: [facility] ENDOSCOPY;  Service: Cardiovascular;  Laterality: N/A;   CARDIOVERSION N/A 02/11/2014   Procedure: CARDIOVERSION;  Surgeon: Pixie Casino, MD;  Location: [facility] ENDOSCOPY;  Service: Cardiovascular;  Laterality: N/A;   CARDIOVERSION N/A 10/01/2020   Procedure: CARDIOVERSION;  Surgeon: Werner Lean, MD;  Location: [facility] ENDOSCOPY;  Service: Cardiovascular;  Laterality: N/A;   CATARACT EXTRACTION     COLONOSCOPY WITH PROPOFOL N/A 02/06/2018   Procedure: COLONOSCOPY WITH PROPOFOL;  Surgeon: Yetta Flock, MD;  Location: [facility] ENDOSCOPY;  Service: Gastroenterology;  Laterality: N/A;   EYE SURGERY     ICD GENERATOR CHANGEOUT N/A 01/17/2020   Procedure: ICD GENERATOR CHANGEOUT;  Surgeon: Evans Lance, MD;  Location: [facility] CV LAB;  Service: Cardiovascular;  Laterality: N/A;   LASEK eye surgery Bilateral 05/2018   LASIK Bilateral    PACEMAKER INSERTION     POLYPECTOMY  02/06/2018   Procedure: POLYPECTOMY;  Surgeon: Yetta Flock, MD;  Location: [facility] ENDOSCOPY;  Service: Gastroenterology;;  TEE WITHOUT CARDIOVERSION N/A 01/07/2014   Procedure: TRANSESOPHAGEAL ECHOCARDIOGRAM (TEE);  Surgeon: Lelon Perla,  MD;  Location: [facility] ENDOSCOPY;  Service: Cardiovascular;  Laterality: N/A;   TEE WITHOUT CARDIOVERSION N/A 02/11/2014   Procedure: TRANSESOPHAGEAL ECHOCARDIOGRAM (TEE);  Surgeon: Pixie Casino, MD;  Location: [facility] ENDOSCOPY;  Service: Cardiovascular;  Laterality: N/A;   TEE WITHOUT CARDIOVERSION N/A 10/01/2020   Procedure: TRANSESOPHAGEAL ECHOCARDIOGRAM (TEE);  Surgeon: Werner Lean, MD;  Location: [facility] ENDOSCOPY;  Service: Cardiovascular;  Laterality: N/A;   TONSILLECTOMY     TRANSTHORACIC ECHOCARDIOGRAM  10/2006, 12/2009    ROS: Review of Systems Negative except as stated above  PHYSICAL EXAM: BP (!) 152/85 (BP Location: Left Arm, Patient Position: Sitting, Cuff Size: Large)    Pulse 62    Resp 16    Ht _0  (1.905 m)    Wt 273 lb (123.8 kg)    SpO2 98%    BMI 34.12 kg/m   Physical Exam Pulse ox 97% room air at rest. Pulse ox remained between 95 to 97% on ambulation around the nurses station 3 times.  Only 1 time he dropped to 92% and recovered within several seconds.  General appearance -older Caucasian male in NAD Mental status -patient upset when talking about his son. Chest - clear to auscultation, no wheezes, rales or rhonchi, symmetric air entry Skin: Patient noted to have numerous abrasions on his forearms CMP Latest Ref Rng & Units 01/20/2021 01/01/2021 12/24/2020  Glucose 65 - 99 mg/dL 83 91 87  BUN 6 - 24 mg/dL 27(H) 40(H) 32(H)  Creatinine 0.76 - 1.27 mg/dL 2.66(H) 2.95(H) 2.72(H)  Sodium 134 - 144 mmol/L 137 139 139  Potassium 3.5 - 5.2 mmol/L 4.0 3.8 4.8  Chloride 96 - 106 mmol/L 100 99 100  CO2 20 - 29 mmol/L _1 Calcium 8.7 - 10.2 mg/dL 9.0 9.0 9.7  Total Protein 6.0 - 8.5 g/dL - - -  Total Bilirubin 0.0 - 1.2 mg/dL - - -  Alkaline Phos 44 - 121 IU/L - - -  AST 0 - 40 IU/L - - -  ALT 0 - 44 IU/L - - -   Lipid Panel     Component Value Date/Time   CHOL 196 05/08/2020 1332   TRIG 135 05/08/2020 1332   HDL 33 (L) 05/08/2020 1332   CHOLHDL 5.9 (H)  05/08/2020 1332   CHOLHDL 3.9 10/12/2015 1026   VLDL 27 10/12/2015 1026   LDLCALC 138 (H) 05/08/2020 1332   LDLDIRECT 102 (H) 09/15/2020 1635    CBC    Component Value Date/Time   WBC 7.9 10/15/2020 1118   WBC 9.2 10/03/2020 0245   RBC 5.25 10/15/2020 1118   RBC 5.04 10/03/2020 0245   HGB 16.0 10/15/2020 1118   HCT 48.4 10/15/2020 1118   PLT 272 10/15/2020 1118   MCV 92 10/15/2020 1118   MCH 30.5 10/15/2020 1118   MCH 30.8 10/03/2020 0245   MCHC 33.1 10/15/2020 1118   MCHC 32.8 10/03/2020 0245   RDW 13.6 10/15/2020 1118   LYMPHSABS 0.8 10/15/2020 1118   MONOABS 820 01/26/2016 1025   EOSABS 0.1 10/15/2020 1118   BASOSABS 0.0 10/15/2020 1118    ASSESSMENT AND PLAN: 1. OSA on CPAP Patient told that he would not meet criteria to continue to keep the O2 for ambulation based on pulse ox reading today.  However he does have documented hypoxia on his sleep study and was recommended to use the O2 with his CPAP  machine.  I will give prescription to my CMA to fax to [facility]  2. Essential hypertension Not at goal.  No changes made today as patient reports blood pressure readings at home have been good and he is mentally upset today.  3. Stressful life event affecting family Patient is already plugged into behavioral health services.   Patient was given the opportunity to ask questions.  Patient verbalized understanding of the plan and was able to repeat key elements of the plan.   No orders of the defined types were placed in this encounter.    Requested Prescriptions    No prescriptions requested or ordered in this encounter    No follow-ups on file.  Karle Plumber, MD, FACP

## 2021-06-06 ENCOUNTER — Other Ambulatory Visit: Payer: Self-pay | Admitting: Physician Assistant

## 2021-06-09 ENCOUNTER — Other Ambulatory Visit: Payer: Self-pay | Admitting: Cardiovascular Disease

## 2021-06-09 ENCOUNTER — Ambulatory Visit (INDEPENDENT_AMBULATORY_CARE_PROVIDER_SITE_OTHER): Payer: Medicaid Other

## 2021-06-09 DIAGNOSIS — I5022 Chronic systolic (congestive) heart failure: Secondary | ICD-10-CM

## 2021-06-09 DIAGNOSIS — Z95810 Presence of automatic (implantable) cardiac defibrillator: Secondary | ICD-10-CM

## 2021-06-09 MED ORDER — DAPAGLIFLOZIN PROPANEDIOL 10 MG PO TABS: 10.0000 mg | ORAL_TABLET | Freq: Every day | ORAL | 6 refills | Status: DC

## 2021-06-11 ENCOUNTER — Telehealth: Payer: Self-pay

## 2021-06-11 NOTE — Telephone Encounter (Signed)
Remote ICM transmission received.  Attempted call to patient regarding ICM remote transmission and left detailed message per DPR.  Advised to return call for any fluid symptoms or questions. Next ICM remote transmission scheduled 07/05/2021.

## 2021-06-11 NOTE — Progress Notes (Signed)
EPIC Encounter for ICM Monitoring  Patient Name: Cody Ortega is a 61 y.o. male Date: 06/11/2021 Primary Care Physican: Ladell Pier, MD Primary Cardiologist: Burt Knack Electrophysiologist: Lovena Le 04/08/2021 Office Weight: 280 lbs                                                             Attempted call to patient and unable to reach.  Left detailed message per DPR regarding transmission. Transmission reviewed.    Optivol thoracic impedance suggesting fluid levels returned close to normal.     Prescribed:  Furosemide 40 mg take 0.5 tablet (20 mg total) by mouth daily.   Labs: 01/20/2021 Creatinine 2.66, BUN 27, Potassium 4.0, Sodium 137, GFR 27 01/01/2021 Creatinine 2.95, BUN 40, Potassium 3.8, Sodium 139, GFR 24  12/24/2020 Creatinine 2.72, BUN 32, Potassium 4.8, Sodium 139, GFR 26  A complete set of results can be found in Results Review.   Recommendations: Left voice mail with ICM number and encouraged to call if experiencing any fluid symptoms.   Follow-up plan: ICM clinic phone appointment on 07/05/2021.   91 day device clinic remote transmission 07/16/2021.     EP/Cardiology Office Visits:   08/05/2021 with Dr Curt Bears to discuss afib ablation.   Last visit with Dr Burt Knack was 11/04/2020 and no recall. Recall 05/04/2022 with Tommye Standard, PA or Oda Kilts, [place].   Copy of ICM check sent to Dr. Lovena Le.  3 month ICM trend: 06/09/2021.    12-14 Month ICM trend:     Rosalene Billings, RN 06/11/2021 8:47 AM

## 2021-06-17 ENCOUNTER — Telehealth: Payer: Self-pay | Admitting: Internal Medicine

## 2021-06-17 NOTE — Telephone Encounter (Signed)
.. °  Medicaid Managed Care   Unsuccessful Outreach Note  06/17/2021 Name: Cody Ortega MRN: [id number] DOB: [birth year]  Referred by: Ladell Pier, MD Reason for referral : High Risk Managed Medicaid (Third attempt to reach the patient to get him scheduled with the MM Team. I left my name and number on his VM.)   Third unsuccessful telephone outreach was attempted today. The patient was referred to the case management team for assistance with care management and care coordination. The patient's primary care provider has been notified of our unsuccessful attempts to make or maintain contact with the patient. The care management team is pleased to engage with this patient at any time in the future should he/she be interested in assistance from the care management team.   Follow Up Plan: We have been unable to make contact with the patient for follow up. The care management team is available to follow up with the patient after provider conversation with the patient regarding recommendation for care management engagement and subsequent re-referral to the care management team.    [place], [place]

## 2021-06-29 DIAGNOSIS — G4733 Obstructive sleep apnea (adult) (pediatric): Secondary | ICD-10-CM | POA: Diagnosis not present

## 2021-06-29 DIAGNOSIS — J690 Pneumonitis due to inhalation of food and vomit: Secondary | ICD-10-CM | POA: Diagnosis not present

## 2021-06-29 DIAGNOSIS — I5020 Unspecified systolic (congestive) heart failure: Secondary | ICD-10-CM | POA: Diagnosis not present

## 2021-06-29 DIAGNOSIS — J9601 Acute respiratory failure with hypoxia: Secondary | ICD-10-CM | POA: Diagnosis not present

## 2021-06-30 DIAGNOSIS — J9601 Acute respiratory failure with hypoxia: Secondary | ICD-10-CM | POA: Diagnosis not present

## 2021-06-30 DIAGNOSIS — I5020 Unspecified systolic (congestive) heart failure: Secondary | ICD-10-CM | POA: Diagnosis not present

## 2021-06-30 DIAGNOSIS — J690 Pneumonitis due to inhalation of food and vomit: Secondary | ICD-10-CM | POA: Diagnosis not present

## 2021-07-05 ENCOUNTER — Ambulatory Visit (INDEPENDENT_AMBULATORY_CARE_PROVIDER_SITE_OTHER): Payer: Medicaid Other

## 2021-07-05 ENCOUNTER — Telehealth: Payer: Self-pay

## 2021-07-05 DIAGNOSIS — Z95810 Presence of automatic (implantable) cardiac defibrillator: Secondary | ICD-10-CM

## 2021-07-05 DIAGNOSIS — I5022 Chronic systolic (congestive) heart failure: Secondary | ICD-10-CM

## 2021-07-05 NOTE — Telephone Encounter (Signed)
Remote ICM transmission received.  Attempted call to patient regarding ICM remote transmission and left detailed message per DPR.  Advised to return call for any fluid symptoms or questions. Next ICM remote transmission scheduled 08/09/2021.   ? ?

## 2021-07-05 NOTE — Progress Notes (Signed)
EPIC Encounter for ICM Monitoring ? ?Patient Name: Cody Ortega is a 61 y.o. male ?Date: 07/05/2021 ?Primary Care Physican: Ladell Pier, MD ?Primary Cardiologist: Burt Knack ?Electrophysiologist: Lovena Le ?04/08/2021 Office Weight: 280 lbs  ?                                                         ?  ?Attempted call to patient and unable to reach.  Left detailed message per DPR regarding transmission. Transmission reviewed.  ?  ?Optivol thoracic impedance suggesting fluid levels close to normal.   ?  ?Prescribed:  ?Furosemide 40 mg take 0.5 tablet (20 mg total) by mouth daily. ?  ?Labs: ?01/20/2021 Creatinine 2.66, BUN 27, Potassium 4.0, Sodium 137, GFR 27 ?01/01/2021 Creatinine 2.95, BUN 40, Potassium 3.8, Sodium 139, GFR 24  ?12/24/2020 Creatinine 2.72, BUN 32, Potassium 4.8, Sodium 139, GFR 26  ?A complete set of results can be found in Results Review. ?  ?Recommendations: Left voice mail with ICM number and encouraged to call if experiencing any fluid symptoms. ?  ?Follow-up plan: ICM clinic phone appointment on 08/09/2021.   91 day device clinic remote transmission 07/16/2021.   ?  ?EP/Cardiology Office Visits:   08/05/2021 with Dr Curt Bears to discuss afib ablation.   Last visit with Dr Burt Knack was 11/04/2020 and no recall. Recall 05/04/2022 with Tommye Standard, PA or Oda Kilts, [place]. ?  ?Copy of ICM check sent to Dr. Lovena Le. ?  ?3 month ICM trend: 07/05/2021. ? ? ? ?12-14 Month ICM trend:  ? ? ? ?Rosalene Billings, RN ?07/05/2021 ?4:01 PM ? ?

## 2021-07-09 ENCOUNTER — Other Ambulatory Visit: Payer: Self-pay | Admitting: Internal Medicine

## 2021-07-09 ENCOUNTER — Other Ambulatory Visit: Payer: Self-pay

## 2021-07-09 DIAGNOSIS — J449 Chronic obstructive pulmonary disease, unspecified: Secondary | ICD-10-CM

## 2021-07-09 MED ORDER — ENTECAVIR 0.5 MG PO TABS: 0.5000 mg | ORAL_TABLET | ORAL | 0 refills | Status: DC

## 2021-07-09 NOTE — Telephone Encounter (Signed)
Requested Prescriptions  ?Pending Prescriptions Disp Refills  ?? oxybutynin (DITROPAN) 5 MG tablet [Pharmacy Med Name: OXYBUTYNIN '5MG'$  TABLETS] 90 tablet 2  ?  Sig: TAKE 1 TABLET(5 MG) BY MOUTH THREE TIMES DAILY  ?  ? Urology:  Bladder Agents Passed - 07/09/2021  6:13 AM  ?  ?  Passed - Valid encounter within last 12 months  ?  Recent Outpatient Visits   ?      ? 1 month ago OSA on CPAP  ? [place] Ladell Pier, MD  ? 2 months ago Essential hypertension  ? [place] Karle Plumber B, MD  ? 7 months ago OSA (obstructive sleep apnea)  ? [place] Ladell Pier, MD  ? 8 months ago Essential hypertension  ? [place], [place], [place]  ? 1 year ago Essential hypertension  ? [place] Ladell Pier, MD  ?  ?  ?Future Appointments   ?        ? In 1 month Ladell Pier, MD [place]  ?  ? ?  ?  ?  ?? SYMBICORT 160-4.5 MCG/ACT inhaler [Pharmacy Med Name: SYMBICORT 160/4.5MCG (120 ORAL INH)] 10.2 g 2  ?  Sig: INHALE 2 PUFFS INTO THE LUNGS TWICE DAILY  ?  ? Pulmonology:  Combination Products Passed - 07/09/2021  6:13 AM  ?  ?  Passed - Valid encounter within last 12 months  ?  Recent Outpatient Visits   ?      ? 1 month ago OSA on CPAP  ? [place] Ladell Pier, MD  ? 2 months ago Essential hypertension  ? [place] Karle Plumber B, MD  ? 7 months ago OSA (obstructive sleep apnea)  ? [place] Ladell Pier, MD  ? 8 months ago Essential hypertension  ? [place], [place], [place]  ? 1 year ago Essential hypertension  ? [place] Ladell Pier, MD  ?  ?  ?Future Appointments   ?        ? In 1 month Ladell Pier, MD [place]  ?  ? ?  ?  ?  ? ?

## 2021-07-12 ENCOUNTER — Encounter: Payer: Self-pay | Admitting: Gastroenterology

## 2021-07-16 ENCOUNTER — Ambulatory Visit (INDEPENDENT_AMBULATORY_CARE_PROVIDER_SITE_OTHER): Payer: Medicaid Other

## 2021-07-16 DIAGNOSIS — I5022 Chronic systolic (congestive) heart failure: Secondary | ICD-10-CM | POA: Diagnosis not present

## 2021-07-16 LAB — CUP PACEART REMOTE DEVICE CHECK
Battery Remaining Longevity: 129 mo
Battery Voltage: 3.02 V
Brady Statistic RV Percent Paced: 1.16 %
Date Time Interrogation Session: 20230317022603
HighPow Impedance: 44 Ohm
HighPow Impedance: 58 Ohm
Implantable Lead Implant Date: 20120806
Implantable Lead Location: 753860
Implantable Lead Model: 185
Implantable Lead Serial Number: 345870
Implantable Pulse Generator Implant Date: 20210917
Lead Channel Impedance Value: 323 Ohm
Lead Channel Impedance Value: 342 Ohm
Lead Channel Pacing Threshold Amplitude: 1.125 V
Lead Channel Pacing Threshold Pulse Width: 0.4 ms
Lead Channel Sensing Intrinsic Amplitude: 7.375 mV
Lead Channel Sensing Intrinsic Amplitude: 7.375 mV
Lead Channel Setting Pacing Amplitude: 2.5 V
Lead Channel Setting Pacing Pulse Width: 0.4 ms
Lead Channel Setting Sensing Sensitivity: 0.3 mV

## 2021-07-19 ENCOUNTER — Telehealth: Payer: Self-pay

## 2021-07-19 DIAGNOSIS — B181 Chronic viral hepatitis B without delta-agent: Secondary | ICD-10-CM

## 2021-07-19 DIAGNOSIS — K7460 Unspecified cirrhosis of liver: Secondary | ICD-10-CM

## 2021-07-19 NOTE — Telephone Encounter (Signed)
Lm on vm for patient to return call.  ? ?Lab orders and [place] order in epic.  ?

## 2021-07-19 NOTE — Telephone Encounter (Signed)
-----   Message from Yetta Flock, MD sent at 07/19/2021 10:48 AM EDT ----- ?Regarding: RE: [place] ?Thanks. He needs CBC, INR, CMET, AFP, and hepatitis B DNA level. Thanks ? ?----- Message ----- ?From: Yevette Edwards, RN ?Sent: 07/19/2021  10:10 AM EDT ?To: Yetta Flock, MD ?Subject: FW: [place]                                        ? ?Pt is due for routing RUQ [place] at this time. Would you like to order labs at this time?  ?Pt is scheduled for a f/u appt on 08/19/21 at 11 am. Please advise. ?Thanks, ?Jassiel Flye ?----- Message ----- ?From: Yevette Edwards, RN ?Sent: 07/19/2021  12:00 AM EDT ?To: Yevette Edwards, RN ?Subject: [place]                                            ? ?RUQ [place] - cirrhosis ? ? ? ?

## 2021-07-20 ENCOUNTER — Other Ambulatory Visit (INDEPENDENT_AMBULATORY_CARE_PROVIDER_SITE_OTHER): Payer: Medicaid Other

## 2021-07-20 DIAGNOSIS — B181 Chronic viral hepatitis B without delta-agent: Secondary | ICD-10-CM | POA: Diagnosis not present

## 2021-07-20 DIAGNOSIS — K7460 Unspecified cirrhosis of liver: Secondary | ICD-10-CM

## 2021-07-20 LAB — CBC
HCT: 47.1 % (ref 39.0–52.0)
Hemoglobin: 15.8 g/dL (ref 13.0–17.0)
MCHC: 33.6 g/dL (ref 30.0–36.0)
MCV: 93.6 fl (ref 78.0–100.0)
Platelets: 191 10*3/uL (ref 150.0–400.0)
RBC: 5.03 Mil/uL (ref 4.22–5.81)
RDW: 14.7 % (ref 11.5–15.5)
WBC: 10.2 10*3/uL (ref 4.0–10.5)

## 2021-07-20 LAB — COMPREHENSIVE METABOLIC PANEL
ALT: 8 U/L (ref 0–53)
AST: 11 U/L (ref 0–37)
Albumin: 4.3 g/dL (ref 3.5–5.2)
Alkaline Phosphatase: 65 U/L (ref 39–117)
BUN: 33 mg/dL — ABNORMAL HIGH (ref 6–23)
CO2: 24 mEq/L (ref 19–32)
Calcium: 9.2 mg/dL (ref 8.4–10.5)
Chloride: 103 mEq/L (ref 96–112)
Creatinine, Ser: 2.33 mg/dL — ABNORMAL HIGH (ref 0.40–1.50)
GFR: 30 mL/min — ABNORMAL LOW (ref >60.00)
Glucose, Bld: 85 mg/dL (ref 70–99)
Potassium: 3.8 mEq/L (ref 3.5–5.1)
Sodium: 137 mEq/L (ref 135–145)
Total Bilirubin: 0.4 mg/dL (ref 0.2–1.2)
Total Protein: 7 g/dL (ref 6.0–8.3)

## 2021-07-20 LAB — PROTIME-INR
INR: 1.4 ratio — ABNORMAL HIGH (ref 0.8–1.0)
Prothrombin Time: 15.6 s — ABNORMAL HIGH (ref 9.6–13.1)

## 2021-07-20 NOTE — Telephone Encounter (Signed)
Received a vm from patient, he was returning my call and asked that I give him a call back.  ? ?Returned call to patient. He is aware that he is due for his routine RUQ [place] and lab work at this time. Pt knows to expect a call from radiology scheduling to set up his appt. Pt told us to inform the schedulers to leave his appt information on his vm if he does not answer. Pt is aware that he can stop by the lab in the basement at his convenience to have labs drawn. Pt verbalized understanding of all information and had no concerns at the end of the call.  ? ?Secures staff message sent to radiology scheduling to contact pt to set up [place] appt. ?

## 2021-07-22 LAB — HEPATITIS B DNA, ULTRAQUANTITATIVE, PCR
Hepatitis B DNA (Calc): <1 Log IU/mL — ABNORMAL HIGH
Hepatitis B DNA: <10 IU/mL — ABNORMAL HIGH

## 2021-07-22 LAB — AFP TUMOR MARKER: AFP-Tumor Marker: 1.7 ng/mL (ref <6.1)

## 2021-07-23 NOTE — Progress Notes (Signed)
Letter sent.

## 2021-07-23 NOTE — Progress Notes (Signed)
Remote ICD transmission.   

## 2021-07-28 ENCOUNTER — Other Ambulatory Visit: Payer: Self-pay

## 2021-07-28 ENCOUNTER — Ambulatory Visit (HOSPITAL_COMMUNITY)
Admission: RE | Admit: 2021-07-28 | Discharge: 2021-07-28 | Disposition: A | Discharge status: Home / Self Care | Payer: Medicaid Other | Source: Ambulatory Visit | Attending: Gastroenterology | Admitting: Gastroenterology

## 2021-07-28 DIAGNOSIS — K7460 Unspecified cirrhosis of liver: Secondary | ICD-10-CM | POA: Insufficient documentation

## 2021-07-28 DIAGNOSIS — B181 Chronic viral hepatitis B without delta-agent: Secondary | ICD-10-CM | POA: Diagnosis present

## 2021-07-30 DIAGNOSIS — J690 Pneumonitis due to inhalation of food and vomit: Secondary | ICD-10-CM | POA: Diagnosis not present

## 2021-07-30 DIAGNOSIS — G4733 Obstructive sleep apnea (adult) (pediatric): Secondary | ICD-10-CM | POA: Diagnosis not present

## 2021-07-30 DIAGNOSIS — I5020 Unspecified systolic (congestive) heart failure: Secondary | ICD-10-CM | POA: Diagnosis not present

## 2021-07-30 DIAGNOSIS — J9601 Acute respiratory failure with hypoxia: Secondary | ICD-10-CM | POA: Diagnosis not present

## 2021-07-31 DIAGNOSIS — J690 Pneumonitis due to inhalation of food and vomit: Secondary | ICD-10-CM | POA: Diagnosis not present

## 2021-07-31 DIAGNOSIS — I5020 Unspecified systolic (congestive) heart failure: Secondary | ICD-10-CM | POA: Diagnosis not present

## 2021-07-31 DIAGNOSIS — J9601 Acute respiratory failure with hypoxia: Secondary | ICD-10-CM | POA: Diagnosis not present

## 2021-08-05 ENCOUNTER — Ambulatory Visit (INDEPENDENT_AMBULATORY_CARE_PROVIDER_SITE_OTHER): Payer: Medicaid Other | Admitting: Cardiology

## 2021-08-05 ENCOUNTER — Encounter: Payer: Self-pay | Admitting: *Deleted

## 2021-08-05 ENCOUNTER — Encounter: Payer: Self-pay | Admitting: Cardiology

## 2021-08-05 VITALS — BP 162/110 | HR 61 | Ht 75.0 in | Wt 272.4 lb

## 2021-08-05 DIAGNOSIS — Z01812 Encounter for preprocedural laboratory examination: Secondary | ICD-10-CM | POA: Diagnosis not present

## 2021-08-05 DIAGNOSIS — I4819 Other persistent atrial fibrillation: Secondary | ICD-10-CM | POA: Diagnosis not present

## 2021-08-05 MED ORDER — HYDRALAZINE HCL 100 MG PO TABS: 100.0000 mg | ORAL_TABLET | Freq: Two times a day (BID) | ORAL | 1 refills | Status: DC

## 2021-08-05 NOTE — Progress Notes (Signed)
? ?Electrophysiology Office Note ? ? ?Date:  08/05/2021  ? ?ID:  Cody Ortega, DOB [birth year], MRN [id number] ? ?PCP:  Ladell Pier, MD  ?Cardiologist:  Burt Knack ?Primary Electrophysiologist: Gaye Alken, MD   ? ?Chief Complaint: AF ?  ?History of Present Illness: ?Cody Ortega is a 61 y.o. male who is being seen today for the evaluation of AF at the request of Ladell Pier, MD. Presenting today for electrophysiology evaluation. ? ?He has a history significant for coronary artery disease, chronic systolic heart failure, cirrhosis, hepatitis B, atrial fibrillation, hypertension, sleep apnea, stage IV CKD.  He is currently on amiodarone.  He had a successful cardioversion 10/01/2020. ? ? ?Today, he denies symptoms of palpitations, chest pain, orthopnea, PND, lower extremity edema, claudication, dizziness, presyncope, syncope, bleeding, or neurologic sequela. The patient is tolerating medications without difficulties.  Generalized fatigue and shortness of breath.  He feels that this is due to his heart failure.  He has difficulty doing all of his daily activities.  He is unhappy with all of his medications and would like to get off of his amiodarone if possible. ? ? ?Past Medical History:  ?Diagnosis Date  ? Anxiety   ? Arthritis   ? Benzodiazepine dependence ([place])   ? Bronchial asthma   ? CAD (coronary artery disease)   ? a. Cath 03/2010: mod RCA stenosis, severe diag stenosis, treated medically given lack of angina.  ? Cardiomyopathy   ? 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 Chronic systolic congestive heart failure ([place])   ? Cirrhosis ([place])   ? secondary to hepatitis B  ? CKD (chronic kidney disease), stage IV ([place])   ? Stage 3-4  ? Depression   ? Diverticulosis   ? Hepatitis B   ? History of cocaine abuse ([place])   ? Hyperlipidemia LDL goal <70   ? 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 Hypertension   ? Hypertensive retinopathy   ? Microcytic anemia   ? Obesity   ?  Persistent atrial fibrillation ([place])   ? a. Episode in 2008 with spontaneous conversion, on amiodarone per EP.  ? S/P implantation of automatic cardioverter/defibrillator (AICD)   ? a. Medtronic, implanted 2012.  ? Sleep apnea   ? Stroke [facility]) 2004  ? Thyroid disease   ? ?Past Surgical History:  ?Procedure Laterality Date  ? CARDIOVERSION N/A 01/07/2014  ? Procedure: CARDIOVERSION;  Surgeon: Lelon Perla, MD;  Location: [facility] ENDOSCOPY;  Service: Cardiovascular;  Laterality: N/A;  ? CARDIOVERSION N/A 02/11/2014  ? Procedure: CARDIOVERSION;  Surgeon: Pixie Casino, MD;  Location: [place];  Service: Cardiovascular;  Laterality: N/A;  ? CARDIOVERSION N/A 10/01/2020  ? Procedure: CARDIOVERSION;  Surgeon: Werner Lean, MD;  Location: [facility] ENDOSCOPY;  Service: Cardiovascular;  Laterality: N/A;  ? CATARACT EXTRACTION    ? COLONOSCOPY WITH PROPOFOL N/A 02/06/2018  ? Procedure: COLONOSCOPY WITH PROPOFOL;  Surgeon: Yetta Flock, MD;  Location: [facility] ENDOSCOPY;  Service: Gastroenterology;  Laterality: N/A;  ? EYE SURGERY    ? ICD GENERATOR CHANGEOUT N/A 01/17/2020  ? Procedure: ICD GENERATOR CHANGEOUT;  Surgeon: Evans Lance, MD;  Location: [place] CV LAB;  Service: Cardiovascular;  Laterality: N/A;  ? LASEK eye surgery Bilateral 05/2018  ? LASIK Bilateral   ? PACEMAKER INSERTION    ? POLYPECTOMY  02/06/2018  ? Procedure: POLYPECTOMY;  Surgeon: Yetta Flock, MD;  Location: [facility];  Service: Gastroenterology;;  ? TEE WITHOUT CARDIOVERSION N/A  01/07/2014  ? Procedure: TRANSESOPHAGEAL ECHOCARDIOGRAM (TEE);  Surgeon: Lelon Perla, MD;  Location: [place];  Service: Cardiovascular;  Laterality: N/A;  ? TEE WITHOUT CARDIOVERSION N/A 02/11/2014  ? Procedure: TRANSESOPHAGEAL ECHOCARDIOGRAM (TEE);  Surgeon: Pixie Casino, MD;  Location: [place];  Service: Cardiovascular;  Laterality: N/A;  ? TEE WITHOUT CARDIOVERSION N/A 10/01/2020  ? Procedure: TRANSESOPHAGEAL ECHOCARDIOGRAM  (TEE);  Surgeon: Werner Lean, MD;  Location: [facility] ENDOSCOPY;  Service: Cardiovascular;  Laterality: N/A;  ? TONSILLECTOMY    ? TRANSTHORACIC ECHOCARDIOGRAM  10/2006, 12/2009  ? ? ? ?Current Outpatient Medications  ?Medication Sig Dispense Refill  ? acetaminophen (TYLENOL) 500 MG tablet Take 1,000 mg by mouth every 6 (six) hours as needed for moderate pain or headache.    ? albuterol (PROVENTIL) (2.5 MG/3ML) 0.083% nebulizer solution Take 3 mLs (2.5 mg total) by nebulization every 6 (six) hours as needed for wheezing or shortness of breath. 150 mL 6  ? alprazolam (XANAX) 2 MG tablet Take 2 mg by mouth every 6 (six) hours as needed for anxiety.    ? amiodarone (PACERONE) 200 MG tablet Take 1 tablet (200 mg total) by mouth daily. 180 tablet 3  ? atorvastatin (LIPITOR) 10 MG tablet Take 1 tablet (10 mg total) by mouth daily. 30 tablet 11  ? carvedilol (COREG) 12.5 MG tablet TAKE 1 TABLET(12.5 MG) BY MOUTH TWICE DAILY WITH A MEAL 180 tablet 3  ? dapagliflozin propanediol (FARXIGA) 10 MG TABS tablet Take 1 tablet (10 mg total) by mouth daily before breakfast. 30 tablet 6  ? Dextran 70-Hypromellose, PF, ([employer] NATURAL TEARS) 0.1-0.3 % SOLN Place 1 drop into both eyes daily as needed (dry eyes).    ? DULoxetine (CYMBALTA) 30 MG capsule Take 90 mg by mouth daily.     ? entecavir (BARACLUDE) 0.5 MG tablet Take 1 tablet (0.5 mg total) by mouth every other day. Must have appointment for Further refills! Thanks 45 tablet 0  ? ezetimibe (ZETIA) 10 MG tablet TAKE 1 TABLET(10 MG) BY MOUTH DAILY 90 tablet 2  ? ferrous sulfate 325 (65 FE) MG tablet Take 325 mg by mouth daily with breakfast.    ? fluticasone (FLONASE) 50 MCG/ACT nasal spray SHAKE LIQUID AND USE 1 SPRAY IN EACH NOSTRIL DAILY 16 g 2  ? furosemide (LASIX) 40 MG tablet Take 0.5 tablets (20 mg total) by mouth daily. 45 tablet 1  ? hydrALAZINE (APRESOLINE) 100 MG tablet Take 1 tablet (100 mg total) by mouth in the morning and at bedtime. 180 tablet 1  ? ondansetron  (ZOFRAN-ODT) 8 MG disintegrating tablet Take 8 mg by mouth 2 (two) times daily as needed for nausea or vomiting.    ? oxybutynin (DITROPAN) 5 MG tablet TAKE 1 TABLET(5 MG) BY MOUTH THREE TIMES DAILY 90 tablet 2  ? polyethylene glycol (MIRALAX) 17 g packet Take as directed, Daily, titrate as needed 14 each 0  ? sacubitril-valsartan (ENTRESTO) 49-51 MG Take 1 tablet by mouth 2 (two) times daily. 60 tablet 11  ? sildenafil (REVATIO) 20 MG tablet Take 2-5 tablets by mouth once daily as needed prior to sexual activity. 100 tablet 3  ? SYMBICORT 160-4.5 MCG/ACT inhaler INHALE 2 PUFFS INTO THE LUNGS TWICE DAILY 10.2 g 2  ? tamsulosin (FLOMAX) 0.4 MG CAPS capsule TAKE 1 CAPSULE(0.4 MG) BY MOUTH DAILY 90 capsule 1  ? XARELTO 20 MG TABS tablet TAKE 1 TABLET(20 MG) BY MOUTH DAILY WITH SUPPER 90 tablet 1  ? albuterol (VENTOLIN HFA) 108 (90  Base) MCG/ACT inhaler INHALE 2 PUFFS INTO THE LUNGS EVERY 6 HOURS AS NEEDED FOR WHEEZING OR SHORTNESS OF BREATH (Patient not taking: Reported on 08/05/2021) 8.5 g 6  ? ?No current facility-administered medications for this visit.  ? ? ?Allergies:   Prochlorperazine, Codeine, and Hydrocodone-acetaminophen  ? ?Social History:  The patient  reports that he quit smoking about 11 years ago. His smoking use included cigarettes. He has a 0.25 pack-year smoking history. He has never used smokeless tobacco. He reports that he does not currently use alcohol. He reports that he does not use drugs.  ? ?Family History:  The patient's family history includes Breast cancer in his mother; Cerebral palsy in his daughter; Diabetes in his father; Multiple myeloma in his mother; Peripheral vascular disease in his father; Prostate cancer in his father.  ? ? ?ROS:  Please see the history of present illness.   Otherwise, review of systems is positive for none.   All other systems are reviewed and negative.  ? ? ?PHYSICAL EXAM: ?VS:  BP (!) 162/110   Pulse 61   Ht _0  (1.905 m)   Wt 272 lb 6.4 oz (123.6 kg)    SpO2 96%   BMI 34.05 kg/m?  , BMI Body mass index is 34.05 kg/m?. ?GEN: Well nourished, well developed, in no acute distress  ?HEENT: normal  ?Neck: no JVD, carotid bruits, or masses ?Cardiac: RRR; no murmurs, rubs,

## 2021-08-05 NOTE — Patient Instructions (Signed)
Medication Instructions:  ?Your physician has recommended you make the following change in your medication:  ?INCREASE Hydralazine to 100 mg daily ? ?*If you need a refill on your cardiac medications before your next appointment, please call your pharmacy* ? ? ?Lab Work: ?Pre procedure labs 12/01/2021:  BMP & CBC ? ?If you have labs (blood work) drawn today and your tests are completely normal, you will receive your results only by: ?MyChart Message (if you have MyChart) OR ?A paper copy in the mail ?If you have any lab test that is abnormal or we need to change your treatment, we will call you to review the results. ? ? ?Testing/Procedures: ?Your physician has requested that you have cardiac CT within 7 days PRIOR to your ablation. Cardiac computed tomography (CT) is a painless test that uses an x-ray machine to take clear, detailed pictures of your heart.  Please follow instruction below located under "other instructions". ?You will get a call from our office to schedule the date for this test. ? ?Your physician has recommended that you have an ablation. Catheter ablation is a medical procedure used to treat some cardiac arrhythmias (irregular heartbeats). During catheter ablation, a long, thin, flexible tube is put into a blood vessel in your groin (upper thigh), or neck. This tube is called an ablation catheter. It is then guided to your heart through the blood vessel. Radio frequency waves destroy small areas of heart tissue where abnormal heartbeats may cause an arrhythmia to start. Please follow instruction letter given to you today. ? ? ?Follow-Up: ?At [facility], you and your health needs are our priority.  As part of our continuing mission to provide you with exceptional heart care, we have created designated Provider Care Teams.  These Care Teams include your primary Cardiologist (physician) and Advanced Practice Providers (APPs -  Physician Assistants and Nurse Practitioners) who all work together to  provide you with the care you need, when you need it. ? ?We recommend signing up for the patient portal called "MyChart".  Sign up information is provided on this After Visit Summary.  MyChart is used to connect with patients for Virtual Visits (Telemedicine).  Patients are able to view lab/test results, encounter notes, upcoming appointments, etc.  Non-urgent messages can be sent to your provider as well.   ?To learn more about what you can do with MyChart, go to [email or web address].   ? ?Your next appointment:   ?1 month(s) after your ablation ? ?The format for your next appointment:   ?In Person ? ?Provider:   ?AFib clinic ? ? ?Thank you for choosing [facility]!! ? ? ?Trinidad Curet, RN ?[phone] ? ? ? ?Other Instructions ? ?Cardiac Ablation ?Cardiac ablation is a procedure to destroy (ablate) some heart tissue that is sending bad signals. These bad signals cause problems in heart rhythm. ?The heart has many areas that make these signals. If there are problems in these areas, they can make the heart beat in a way that is not normal. Destroying some tissues can help make the heart rhythm normal. ?Tell your doctor about: ?Any allergies you have. ?All medicines you are taking. These include vitamins, herbs, eye drops, creams, and over-the-counter medicines. ?Any problems you or family members have had with medicines that make you fall asleep (anesthetics). ?Any blood disorders you have. ?Any surgeries you have had. ?Any medical conditions you have, such as kidney failure. ?Whether you are pregnant or may be pregnant. ?What are the risks? ?This is a safe  procedure. But problems may occur, including: ?Infection. ?Bruising and bleeding. ?Bleeding into the chest. ?Stroke or blood clots. ?Damage to nearby areas of your body. ?Allergies to medicines or dyes. ?The need for a pacemaker if the normal system is damaged. ?Failure of the procedure to treat the problem. ?What happens before the  procedure? ?Medicines ?Ask your doctor about: ?Changing or stopping your normal medicines. This is important. ?Taking aspirin and ibuprofen. Do not take these medicines unless your doctor tells you to take them. ?Taking other medicines, vitamins, herbs, and supplements. ?General instructions ?Follow instructions from your doctor about what you cannot eat or drink. ?Plan to have someone take you home from the hospital or clinic. ?If you will be going home right after the procedure, plan to have someone with you for 24 hours. ?Ask your doctor what steps will be taken to prevent infection. ?What happens during the procedure? ? ?An IV tube will be put into one of your veins. ?You will be given a medicine to help you relax. ?The skin on your neck or groin will be numbed. ?A cut (incision) will be made in your neck or groin. A needle will be put through your cut and into a large vein. ?A tube (catheter) will be put into the needle. The tube will be moved to your heart. ?Dye may be put through the tube. This helps your doctor see your heart. ?Small devices (electrodes) on the tube will send out signals. ?A type of energy will be used to destroy some heart tissue. ?The tube will be taken out. ?Pressure will be held on your cut. This helps stop bleeding. ?A bandage will be put over your cut. ?The exact procedure may vary among doctors and hospitals. ?What happens after the procedure? ?You will be watched until you leave the hospital or clinic. This includes checking your heart rate, breathing rate, oxygen, and blood pressure. ?Your cut will be watched for bleeding. You will need to lie still for a few hours. ?Do not drive for 24 hours or as long as your doctor tells you. ?Summary ?Cardiac ablation is a procedure to destroy some heart tissue. This is done to treat heart rhythm problems. ?Tell your doctor about any medical conditions you may have. Tell him or her about all medicines you are taking to treat them. ?This is a  safe procedure. But problems may occur. These include infection, bruising, bleeding, and damage to nearby areas of your body. ?Follow what your doctor tells you about food and drink. You may also be told to change or stop some of your medicines. ?After the procedure, do not drive for 24 hours or as long as your doctor tells you. ?This information is not intended to replace advice given to you by your health care provider. Make sure you discuss any questions you have with your health care provider. ?Document Revised: 03/21/2019 Document Reviewed: 03/21/2019 ?Elsevier Patient Education ? 2022 [place]. ?              ? ? ? ?

## 2021-08-06 ENCOUNTER — Telehealth: Payer: Self-pay

## 2021-08-06 NOTE — Telephone Encounter (Signed)
Called patient and left message. Explained that we can schedule him for an appointment and that he can drop off paperwork at office to be reviewed by Dr Burt Knack when he has the chance. Asked that pt call back. ?

## 2021-08-06 NOTE — Telephone Encounter (Signed)
-----   Message from Stanton Kidney, RN sent at 08/06/2021  2:36 PM EDT ----- ?Regarding: [facility]op appt ?Pt saw [facility] yesterday. ?He mentioned the need an appt w/ Burt Knack to fill out disability paperwork. ? ?Told pt not sure when he could get in to see [facility] and if appropriate MD. ?Aware RN will follow up with him about this. ? ?:) ? ? ?

## 2021-08-08 ENCOUNTER — Other Ambulatory Visit: Payer: Self-pay | Admitting: Cardiovascular Disease

## 2021-08-11 NOTE — Progress Notes (Signed)
No ICM remote transmission received for 08/09/2021 and next ICM transmission scheduled for 08/23/2021.   ?

## 2021-08-12 ENCOUNTER — Ambulatory Visit: Payer: Medicaid Other | Attending: Internal Medicine | Admitting: Internal Medicine

## 2021-08-12 ENCOUNTER — Encounter: Payer: Self-pay | Admitting: Internal Medicine

## 2021-08-12 VITALS — BP 160/85 | HR 84 | Resp 16 | Wt 267.4 lb

## 2021-08-12 DIAGNOSIS — J302 Other seasonal allergic rhinitis: Secondary | ICD-10-CM

## 2021-08-12 DIAGNOSIS — J449 Chronic obstructive pulmonary disease, unspecified: Secondary | ICD-10-CM

## 2021-08-12 DIAGNOSIS — Z9989 Dependence on other enabling machines and devices: Secondary | ICD-10-CM

## 2021-08-12 DIAGNOSIS — D6869 Other thrombophilia: Secondary | ICD-10-CM

## 2021-08-12 DIAGNOSIS — I5022 Chronic systolic (congestive) heart failure: Secondary | ICD-10-CM | POA: Diagnosis not present

## 2021-08-12 DIAGNOSIS — I480 Paroxysmal atrial fibrillation: Secondary | ICD-10-CM | POA: Diagnosis not present

## 2021-08-12 DIAGNOSIS — I10 Essential (primary) hypertension: Secondary | ICD-10-CM | POA: Diagnosis not present

## 2021-08-12 DIAGNOSIS — G4733 Obstructive sleep apnea (adult) (pediatric): Secondary | ICD-10-CM | POA: Diagnosis not present

## 2021-08-12 DIAGNOSIS — N1832 Chronic kidney disease, stage 3b: Secondary | ICD-10-CM

## 2021-08-12 DIAGNOSIS — H4312 Vitreous hemorrhage, left eye: Secondary | ICD-10-CM | POA: Insufficient documentation

## 2021-08-12 MED ORDER — CARVEDILOL 12.5 MG PO TABS: ORAL_TABLET | ORAL | 3 refills | Status: DC

## 2021-08-12 MED ORDER — FLUTICASONE PROPIONATE 50 MCG/ACT NA SUSP: NASAL | 4 refills | Status: DC

## 2021-08-12 MED ORDER — SYMBICORT 160-4.5 MCG/ACT IN AERO: 2.0000 | INHALATION_SPRAY | Freq: Two times a day (BID) | RESPIRATORY_TRACT | 6 refills | Status: DC

## 2021-08-12 MED ORDER — ALBUTEROL SULFATE HFA 108 (90 BASE) MCG/ACT IN AERS: 2.0000 | INHALATION_SPRAY | Freq: Four times a day (QID) | RESPIRATORY_TRACT | 6 refills | Status: DC | PRN

## 2021-08-12 NOTE — Progress Notes (Signed)
? ? ?Patient ID: Cody Ortega, male    DOB: 1961/01/02  MRN: [id number] ? ?CC: Chronic disease management ? ?Subjective: ?Cody Ortega is a 61 y.o. male who presents for chronic disease management ?His concerns today include:  ?The patient with history of COPD and asthma, HTN, CKD stage III (Dr. Candiss Norse), CAD, and NICM with chronic systolic CHF (EF 71-16% 08/7901) and ICD, PAF, immune active hep B, adenomatous polyps, dep/anx (followed by psychiatrist, Noemi Chapel), OSA (intolerant of CPAP mask), hypoxic resp failure on O2 (mainly at nights now with CPAP).  ? ?HTN/CHF/PAF:  ?-plans to have cardiac ablation later this yr ?Currently taking: see medication list.  Reports compliance with amiodarone, carvedilol, furosemide, hydralazine, Entresto, Xarelto.  He has some bruising on his arms from where his dog scratches him. ?Med Adherence: $RemoveBeforeD'[x]'nlVKGUrrcXtcTb$  Yes  but sometimes he forgets to take meds on time.  He has a pill box.   ?Medication side effects: $RemoveBefor'[]'BZcJkHXnIJNw$  Yes    '[x]'$  No ?Adherence with salt restriction: $RemoveBeforeDEI'[x]'LInVIvoKUDBHZnlX$  Yes    '[]'$  No ?No LE edema.  CP only when he gets flare of asthma/COPD ? ?OSA:  using CPAP consistently.  Bleeds oxygen into it. ? ?CKD 3b: He states it has been a while since he has seen his kidney doctor Dr. Candiss Norse.  GFR fluctuates between 20s to 30s.  Last GFR was 30.  He is not on any NSAIDs. ? ?COPD/asthma:  some rattle in chest in mornings since increase pollen. ?Out of Symbicort, Albuterol and Flonase x few wks ?-All of his oxygen portable tanks were removed from the home except the large O2 tank and a concentrator attach to CPAP machine because he no longer qualified for it based on his O2 readings from last visit..  ? ?Patient Active Problem List  ? Diagnosis Date Noted  ? Class 2 severe obesity with serious comorbidity and body mass index (BMI) of 35.0 to 35.9 in adult, unspecified obesity type ([place]) 08/12/2021  ? Vitreous hemorrhage of left eye ([place]) 08/12/2021  ? Chronic hypoxemic respiratory failure ([place]) 12/12/2020  ?  Secondary hypercoagulable state ([place]) 10/14/2020  ? Acute respiratory failure with hypoxia ([place]) 10/03/2020  ? Aspiration pneumonia of both lower lobes due to gastric secretions ([place]) 10/03/2020  ? Aspiration pneumonia ([place]) 10/03/2020  ? HFrEF (heart failure with reduced ejection fraction) ([place])   ? Stage 3b chronic kidney disease ([place]) 09/05/2019  ? BPH (benign prostatic hyperplasia) 06/19/2019  ? Dental abscess 04/30/2018  ? Adenomatous polyp of colon 02/09/2018  ? 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 agent with hepatic coma 02/09/2018  ? Osteoarthritis of both knees 06/12/2017  ? COPD with asthma ([place]) 03/17/2016  ? Chronic seasonal allergic rhinitis 03/17/2016  ? GERD (gastroesophageal reflux disease) 03/17/2016  ? Chronic pain syndrome 03/16/2015  ? Obstructive sleep apnea 03/16/2015  ? Chronic low back pain with right-sided sciatica 03/16/2015  ? Sinus brady-tachy syndrome ([place]) 02/20/2014  ? Atrial fibrillation ([place]) 12/31/2013  ? Automatic implantable cardioverter-defibrillator in situ 03/08/2011  ? Erectile dysfunction 02/28/2011  ? CAD, NATIVE VESSEL 04/26/2010  ? PURE HYPERCHOLESTEROLEMIA 04/12/2010  ? HLD (hyperlipidemia) 01/11/2010  ? Essential hypertension, malignant 12/18/2009  ? Chronic systolic heart failure ([place]) 12/18/2009  ?  ? ?Current Outpatient Medications on File Prior to Visit  ?Medication Sig Dispense Refill  ? acetaminophen (TYLENOL) 500 MG tablet Take 1,000 mg by mouth every 6 (six) hours as needed for moderate pain or headache.    ? albuterol (PROVENTIL) (2.5 MG/3ML) 0.083% nebulizer solution Take 3  mLs (2.5 mg total) by nebulization every 6 (six) hours as needed for wheezing or shortness of breath. (Patient not taking: Reported on 08/12/2021) 150 mL 6  ? alprazolam (XANAX) 2 MG tablet Take 2 mg by mouth every 6 (six) hours as needed for anxiety.    ? amiodarone (PACERONE) 200 MG tablet Take 1 tablet (200 mg total) by mouth daily. 180 tablet 3  ? atorvastatin (LIPITOR) 10 MG tablet Take 1  tablet (10 mg total) by mouth daily. 30 tablet 11  ? dapagliflozin propanediol (FARXIGA) 10 MG TABS tablet Take 1 tablet (10 mg total) by mouth daily before breakfast. 30 tablet 6  ? Dextran 70-Hypromellose, PF, ([employer] NATURAL TEARS) 0.1-0.3 % SOLN Place 1 drop into both eyes daily as needed (dry eyes).    ? DULoxetine (CYMBALTA) 30 MG capsule Take 90 mg by mouth daily.     ? entecavir (BARACLUDE) 0.5 MG tablet Take 1 tablet (0.5 mg total) by mouth every other day. Must have appointment for Further refills! Thanks 45 tablet 0  ? ezetimibe (ZETIA) 10 MG tablet TAKE 1 TABLET(10 MG) BY MOUTH DAILY 90 tablet 2  ? ferrous sulfate 325 (65 FE) MG tablet Take 325 mg by mouth daily with breakfast.    ? furosemide (LASIX) 40 MG tablet Take 0.5 tablets (20 mg total) by mouth daily. 45 tablet 1  ? hydrALAZINE (APRESOLINE) 100 MG tablet Take 1 tablet (100 mg total) by mouth in the morning and at bedtime. 180 tablet 1  ? ondansetron (ZOFRAN-ODT) 8 MG disintegrating tablet Take 8 mg by mouth 2 (two) times daily as needed for nausea or vomiting.    ? oxybutynin (DITROPAN) 5 MG tablet TAKE 1 TABLET(5 MG) BY MOUTH THREE TIMES DAILY 90 tablet 2  ? polyethylene glycol (MIRALAX) 17 g packet Take as directed, Daily, titrate as needed 14 each 0  ? sacubitril-valsartan (ENTRESTO) 49-51 MG Take 1 tablet by mouth 2 (two) times daily. 60 tablet 11  ? sildenafil (REVATIO) 20 MG tablet Take 2-5 tablets by mouth once daily as needed prior to sexual activity. 100 tablet 3  ? tamsulosin (FLOMAX) 0.4 MG CAPS capsule TAKE 1 CAPSULE(0.4 MG) BY MOUTH DAILY 90 capsule 1  ? XARELTO 20 MG TABS tablet TAKE 1 TABLET(20 MG) BY MOUTH DAILY WITH SUPPER 90 tablet 1  ? ?No current facility-administered medications on file prior to visit.  ? ? ?Allergies  ?Allergen Reactions  ? Prochlorperazine Other (See Comments)  ?  Medication caused a lot of issues like blurred vision, nausea, pain, and carpool turnel  ? Codeine Itching, Nausea And Vomiting and Hives  ?  Hydrocodone-Acetaminophen Itching and Nausea And Vomiting  ? ? ?Social History  ? ?Socioeconomic History  ? Marital status: Divorced  ?  Spouse name: Not on file  ? Number of children: 2  ? Years of education: Not on file  ? Highest education level: Not on file  ?Occupational History  ? Occupation: Unemployed  ?  Employer: UNEMPLOYED  ?  Comment: basketball referee in the past  ?Tobacco Use  ? Smoking status: Former  ?  Packs/day: 0.50  ?  Years: 0.50  ?  Pack years: 0.25  ?  Types: Cigarettes  ?  Quit date: 05/02/2010  ?  Years since quitting: 11.2  ? Smokeless tobacco: Never  ? Tobacco comments:  ?  Significant Second-hand and only 3 months himself  ?Vaping Use  ? Vaping Use: Never used  ?Substance and Sexual Activity  ? Alcohol  use: Not Currently  ?  Alcohol/week: 0.0 standard drinks  ?  Comment: rare  ? Drug use: No  ?  Comment: Reported history of cocaine abuse off and on   ? Sexual activity: Not on file  ?Other Topics Concern  ? Not on file  ?Social History Narrative  ? Living with a son who is a 53 year old.  He is not   ?  working, unemployed for 3 years.  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  Denied use of alcohol.  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   ?   ? [place] [facility]:  ? Originally from [place], [place]. He grew up in [place]. He moved to [place] in 1985. 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 No pets currently. No bird exposure. He did have mold in a previous home. No hot tub exposure.   ?     ? ?Social Determinants of Health  ? ?Financial Resource Strain: Not on file  ?Food Insecurity: Not on file  ?Transportation Needs: Not on file  ?Physical Activity: Not on file  ?Stress: Not on file  ?Social Connections: Not on file  ?Intimate Partner Violence: Not on file  ? ? ?Family History  ?Problem Relation Age of Onset  ? Breast cancer Mother   ? Multiple myeloma Mother   ? Prostate cancer Father   ? Diabetes Father   ?  Peripheral vascular disease Father   ? Cerebral palsy Daughter   ? Lung disease Neg Hx   ? Colon cancer Neg Hx   ? Stomach cancer Neg Hx   ? Esophageal cancer Neg Hx   ? Colon polyps Neg Hx   ? ? ?Past Sur

## 2021-08-13 ENCOUNTER — Other Ambulatory Visit: Payer: Self-pay

## 2021-08-13 MED ORDER — SACUBITRIL-VALSARTAN 49-51 MG PO TABS
1.0000 | ORAL_TABLET | Freq: Two times a day (BID) | ORAL | 0 refills | Status: DC
Start: 2021-08-13 — End: 2021-11-17

## 2021-08-17 ENCOUNTER — Ambulatory Visit: Payer: Medicaid Other | Admitting: Family Medicine

## 2021-08-18 ENCOUNTER — Other Ambulatory Visit: Payer: Self-pay | Admitting: Physician Assistant

## 2021-08-19 ENCOUNTER — Ambulatory Visit (INDEPENDENT_AMBULATORY_CARE_PROVIDER_SITE_OTHER): Payer: Medicaid Other | Admitting: Gastroenterology

## 2021-08-19 ENCOUNTER — Encounter: Payer: Self-pay | Admitting: Family Medicine

## 2021-08-19 ENCOUNTER — Encounter: Payer: Self-pay | Admitting: Gastroenterology

## 2021-08-19 ENCOUNTER — Ambulatory Visit: Payer: Medicaid Other | Admitting: Family Medicine

## 2021-08-19 VITALS — BP 169/98 | HR 57 | Ht 75.0 in | Wt 276.0 lb

## 2021-08-19 VITALS — BP 152/84 | HR 85 | Ht 75.0 in | Wt 267.0 lb

## 2021-08-19 DIAGNOSIS — Z7901 Long term (current) use of anticoagulants: Secondary | ICD-10-CM

## 2021-08-19 DIAGNOSIS — K5909 Other constipation: Secondary | ICD-10-CM | POA: Diagnosis not present

## 2021-08-19 DIAGNOSIS — G4733 Obstructive sleep apnea (adult) (pediatric): Secondary | ICD-10-CM

## 2021-08-19 DIAGNOSIS — R519 Headache, unspecified: Secondary | ICD-10-CM

## 2021-08-19 DIAGNOSIS — K7460 Unspecified cirrhosis of liver: Secondary | ICD-10-CM | POA: Diagnosis not present

## 2021-08-19 DIAGNOSIS — I509 Heart failure, unspecified: Secondary | ICD-10-CM | POA: Diagnosis not present

## 2021-08-19 DIAGNOSIS — Z86010 Personal history of colonic polyps: Secondary | ICD-10-CM

## 2021-08-19 DIAGNOSIS — Z9989 Dependence on other enabling machines and devices: Secondary | ICD-10-CM | POA: Diagnosis not present

## 2021-08-19 DIAGNOSIS — B181 Chronic viral hepatitis B without delta-agent: Secondary | ICD-10-CM

## 2021-08-19 MED ORDER — ENTECAVIR 0.5 MG PO TABS: 0.5000 mg | ORAL_TABLET | ORAL | 3 refills | Status: DC

## 2021-08-19 NOTE — Patient Instructions (Addendum)
Please continue using your CPAP regularly. While your insurance requires that you use CPAP at least 4 hours each night on 70% of the nights, I recommend, that you not skip any nights and use it throughout the night if you can. Getting used to CPAP and staying with the treatment long term does take time and patience and discipline. Untreated obstructive sleep apnea when it is moderate to severe can have an adverse impact on cardiovascular health and raise her risk for heart disease, arrhythmias, hypertension, congestive heart failure, stroke and diabetes. Untreated obstructive sleep apnea causes sleep disruption, nonrestorative sleep, and sleep deprivation. This can have an impact on your day to day functioning and cause daytime sleepiness and impairment of cognitive function, memory loss, mood disturbance, and problems focussing. Using CPAP regularly can improve these symptoms ? ?We will increase pressure to 11cmH20. Please keep using CPAp every night and focus on using for at least 4 hours. Please let [employer] know if you want to change your mask.  ? ?Follow up in 6 months  ?

## 2021-08-19 NOTE — Patient Instructions (Addendum)
If you are age 59 or older, your body mass index should be between 23-30. Your Body mass index is 33.37 kg/m?Marland Kitchen If this is out of the aforementioned range listed, please consider follow up with your Primary Care Provider. ? ?If you are age 67 or younger, your body mass index should be between 19-25. Your Body mass index is 33.37 kg/m?Marland Kitchen If this is out of the aformentioned range listed, please consider follow up with your Primary Care Provider.  ? ?________________________________________________________ ? ?The  GI providers would like to encourage you to use [facility] to communicate with providers for non-urgent requests or questions.  Due to long hold times on the telephone, sending your provider a message by [facility] may be a faster and more efficient way to get a response.  Please allow 48 business hours for a response.  Please remember that this is for non-urgent requests.  ?_______________________________________________________ ? ?We will add you to the Hospital wait list for a colonoscopy procedure this summer. ? ?We have sent the following medications to your pharmacy for you to pick up at your convenience: ?[place]: Take 1 tablet by mouth every other day. ? ?We will continue to do labs and ultrasound every 6 months. ? ?Please purchase the following medications over the counter and take as directed: ?Miralax - Take once daily and titrate as needed ?

## 2021-08-19 NOTE — Progress Notes (Signed)
? ? ?Chief Complaint  ?Patient presents with  ? Follow-up  ?  RM 2, alone. Last seen 04/08/21.   ? ? ?HISTORY OF PRESENT ILLNESS: ? ?08/19/21 ALL:  ?Cody Ortega returns for follow up for OSA on CPAP. He was last seen 04/2021. Compliance was sub optimal. We sent him for a mask refitting and encouraged consistent use. Since, he reports using it every night. Sometimes mask slips at night but he is comfortable. He is using a nasal mask. He does report feeling that he doesn't get enough pressure. He reports that his nasal prongs feel that they are collapsing. He  does note some improvement in sleep quality. He feels less sleepy during the day.  ? ?Compliance report dated 07/20/2021-4/19-2023 reveals he used CPAP 29/30 days for compliance of 97%. He used CPAP greater than 4 hours 11/30 days for compliance of 37%. Average usage was 4 hours. Residual AHI was 5.5 on 10cmH20. There was as leak noted in 95th% of 38.5l/min.  ? ?04/08/2021 ALL:  ?Cody Ortega is a 61 y.o. male here today for follow up for OSA on CPAP and headaches. He was seen in consult with Dr Rexene Alberts 08/2020 for recurrent tension style headaches. CT head was unremarkable. PSG 11/26/2020 showed severe OSA with AHI 59.1/hr and O2 nadir 75%. He ws started on CPAP therapy. He does states that he is using CPAP consistently. He feels he uses for at least 4 hours every day but reports that his sleep schedule is all off. He does not go to bed at the same time each night and may take several naps during the day. He is on disability for CHF and not able to work. He did not bring CPAP to review data. He reports feeling "like an 61 year old" for two days right after starting therapy but not sure he has continues to note benefit.  ? ?Addendum: ? ? ? ?HISTORY (copied from Dr Guadelupe Sabin previous note) ? ?Dear Dr. Marin Comment,  ?  ?I saw your patient, Cody Ortega, upon your kind request in my neurologic clinic today for initial consultation of his recurrent headaches including nocturnal  headaches.  The patient is unaccompanied today.  As you know, Cody Ortega is a 61 year old right-handed man with an underlying complex medical history of coronary artery disease, cardiomyopathy, chronic systolic congestive heart failure, atrial fibrillation, status post AICD implant in 2012 with generator change out in September 2021, chronic kidney disease, history of hepatitis B, history of substance use, history of liver cirrhosis anxiety, arthritis, asthma, anemia, stroke, thyroid disease, hypertension, hyperlipidemia, sleep apnea, not on CPAP therapy, and obesity, who reports recurrent headaches in the past few months.  He does not have a prior history of headaches or migraines.  His headaches are not debilitating and not one-sided or throbbing, no associated nausea or vomiting but he does have some light sensitivity.  He feels that his headaches got worse as the day progresses and are made worse by stress.  He tries to hydrate well with water but drinks quite a bit of caffeine, at least 4 cans of soda and about 1 serving of tea daily, typically no coffee.  He tries to drink about 3-4 bottles of water per day.  He does not drink any alcohol.  He reports a family history of alcoholism and drug addiction.  He has a remote history of cocaine use.  He is followed by psychiatry for his anxiety and depression and reports fluctuating mood related symptoms, some increase in depressive  symptoms from time to time.  He is followed closely by psychiatry and sees Noemi Chapel, NP.  ?I reviewed your office note from 09/03/2020.  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  He is followed by ophthalmology/retina specialist and cardiology.  He is on numerous medications. ?He had a head CT without contrast as well as cervical spine CT without contrast on 07/19/2010 and I was able to review the results:  ?IMPRESSION:  ?No  acute bony abnormality of the cervical spine.  ?  ?Cannot exclude a disc bulge at C4-5.  Appearances are similar  ?compared to prior study of 2006.  ?  ?He had a sleep study several years ago, he is not sure how severe his sleep apnea was but he tried CPAP and could not tolerate it.  He would be willing to try a nasal interface but reports that he would not be able to use a facemask.  He is trying to lose weight.  He is trying to stay active.  He lives with his girlfriend. ?  ?He had prescription eyeglasses some years ago but these caused headaches.  His current headaches are intermittent, not long-lasting, are not associated with any neurological accompaniments.  He has a history of stroke some 6 years ago.  ?  ?He reports a fall last week but did not injure himself. ?  ?He does not sleep well and does not keep a very set schedule, he may be in bed anywhere between 3 AM and 4:30 AM, rise time can vary between 1:30 PM to 5 PM.  Epworth sleepiness score is 7 out of 24, fatigue severity score is 46 out of 63. ?  ?He reports chronic low back pain and herniated disc in the lower back in the 90s. ? ? ?REVIEW OF SYSTEMS: Out of a complete 14 system review of symptoms, the patient complains only of the following symptoms, fatigue, shob, anxiety all other reviewed systems are negative. ? ?ESS: 9, previously 10 ? ? ?ALLERGIES: ?Allergies  ?Allergen Reactions  ? Prochlorperazine Other (See Comments)  ?  Medication caused a lot of issues like blurred vision, nausea, pain, and carpool turnel  ? Codeine Itching, Nausea And Vomiting and Hives  ? Hydrocodone-Acetaminophen Itching and Nausea And Vomiting  ? ? ? ?HOME MEDICATIONS: ?Outpatient Medications Prior to Visit  ?Medication Sig Dispense Refill  ? acetaminophen (TYLENOL) 500 MG tablet Take 1,000 mg by mouth every 6 (six) hours as needed for moderate pain or headache.    ? albuterol (PROVENTIL) (2.5 MG/3ML) 0.083% nebulizer solution Take 3 mLs (2.5 mg total) by nebulization  every 6 (six) hours as needed for wheezing or shortness of breath. 150 mL 6  ? albuterol (VENTOLIN HFA) 108 (90 Base) MCG/ACT inhaler Inhale 2 puffs into the lungs every 6 (six) hours as needed for wheezing or shortness of breath. 8.5 g 6  ? alprazolam (XANAX) 2 MG tablet Take 2 mg by mouth every 6 (six) hours as needed for anxiety.    ? amiodarone (PACERONE) 200 MG tablet Take 1 tablet (200 mg total) by mouth daily. 180 tablet 3  ? atorvastatin (LIPITOR) 10 MG tablet Take 1 tablet (10 mg total) by mouth daily. 30 tablet 11  ? carvedilol (COREG) 12.5 MG tablet Take 1.5 tabs PO twice a day PO 270 tablet 3  ? dapagliflozin propanediol (FARXIGA) 10 MG TABS tablet Take 1 tablet (10 mg  total) by mouth daily before breakfast. 30 tablet 6  ? DULoxetine (CYMBALTA) 30 MG capsule Take 90 mg by mouth daily.     ? entecavir (BARACLUDE) 0.5 MG tablet Take 1 tablet (0.5 mg total) by mouth every other day. 45 tablet 3  ? ezetimibe (ZETIA) 10 MG tablet TAKE 1 TABLET(10 MG) BY MOUTH DAILY 90 tablet 2  ? ferrous sulfate 325 (65 FE) MG tablet Take 325 mg by mouth daily with breakfast.    ? fluticasone (FLONASE) 50 MCG/ACT nasal spray SHAKE LIQUID AND USE 1 SPRAY IN EACH NOSTRIL DAILY 16 g 4  ? furosemide (LASIX) 40 MG tablet Take 0.5 tablets (20 mg total) by mouth daily. 45 tablet 1  ? hydrALAZINE (APRESOLINE) 100 MG tablet Take 1 tablet (100 mg total) by mouth in the morning and at bedtime. (Patient taking differently: Take 100 mg by mouth 3 (three) times daily.) 180 tablet 1  ? ondansetron (ZOFRAN-ODT) 8 MG disintegrating tablet Take 8 mg by mouth 2 (two) times daily as needed for nausea or vomiting.    ? oxybutynin (DITROPAN) 5 MG tablet TAKE 1 TABLET(5 MG) BY MOUTH THREE TIMES DAILY 90 tablet 2  ? polyethylene glycol (MIRALAX) 17 g packet Take as directed, Daily, titrate as needed 14 each 0  ? sacubitril-valsartan (ENTRESTO) 49-51 MG Take 1 tablet by mouth 2 (two) times daily. 180 tablet 0  ? sildenafil (REVATIO) 20 MG tablet Take  2-5 tablets by mouth once daily as needed prior to sexual activity. 100 tablet 3  ? SYMBICORT 160-4.5 MCG/ACT inhaler Inhale 2 puffs into the lungs 2 (two) times daily. 10.2 g 6  ? tamsulosin (FLOMAX) 0.4

## 2021-08-19 NOTE — Progress Notes (Signed)
? 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 He has a complicated history with CHF (EF 30-35%), ICD in place, A. fib on Xarelto, history of stroke.    ? ?Recall he was diagnosed with hepatitis B in August 2019, high viral load with positive E antigen.  Liver imaging at the time was consistent with cirrhosis.  He was started on entecavir 0.5 mg every other day due to his CKD.  Over time his viral load has decreased and has since become undetectable and has remained so. Over time he seroconverted in his hepatitis B E antigen is now negative.  His ALT and AST remain normal as of labs 3/21.  He is compliant with the regimen and tolerating it well.  He is not drinking any alcohol. ? ?His right upper quadrant ultrasound recently showed stable changes of cirrhosis without new findings.  He generally continues to feel well.  Denies any lower extremity edema or ascites.  Has not had had issues with varices, on Coreg twice daily so has not pursued variceal screening.  No history of encephalopathy. ? ?He has a history of CHF, last EF 30 to 35%.  On regimen per cardiology.  He has had issues with A-fib despite amiodarone, states he is getting an ablation procedure this August. ? ?He otherwise describes constipation that continues to bother him at times.  He is taking MiraLAX as needed but not daily.  He states when he takes it it tends to work well.  We discussed options. ?He is due for surveillance colonoscopy given multiple adenomas removed in October 2019. ? ?Prior workup: ?Colonoscopy 02/06/2018 - 7 small polyps, diverticulosis, hemorrhoids - adenomas - repeat in 3 years ?  ?  ?RUQ [place] 07/29/2021 -  ?IMPRESSION: ?1. Cirrhosis. ?2. 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 ?3. No ascites. ? ? ?Past Medical History:  ?Diagnosis Date  ? Anxiety   ? Arthritis   ? Benzodiazepine dependence ([place])   ? Bronchial asthma   ? CAD (coronary artery disease)   ? a. Cath 03/2010: mod RCA  stenosis, severe diag stenosis, treated medically given lack of angina.  ? Cardiomyopathy   ? 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 Chronic systolic congestive heart failure ([place])   ? Cirrhosis ([place])   ? secondary to hepatitis B  ? CKD (chronic kidney disease), stage IV ([place])   ? Stage 3-4  ? Depression   ? Diverticulosis   ? Hepatitis B   ? History of cocaine abuse ([place])   ? Hyperlipidemia LDL goal <70   ? 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 Hypertension   ? Hypertensive retinopathy   ? Microcytic anemia   ? Obesity   ? Persistent atrial fibrillation ([place])   ? a. Episode in 2008 with spontaneous conversion, on amiodarone per EP.  ? S/P implantation of automatic cardioverter/defibrillator (AICD)   ? a. [employer], implanted 2012.  ? Sleep apnea   ? Stroke [facility]) 2004  ? Thyroid disease   ? ? ? ?Past Surgical History:  ?Procedure Laterality Date  ? CARDIOVERSION N/A 01/07/2014  ? Procedure: CARDIOVERSION;  Surgeon: Lelon Perla, MD;  Location: [facility] ENDOSCOPY;  Service: Cardiovascular;  Laterality: N/A;  ? CARDIOVERSION N/A 02/11/2014  ? Procedure: CARDIOVERSION;  Surgeon: Pixie Casino, MD;  Location: [place];  Service: Cardiovascular;  Laterality: N/A;  ? CARDIOVERSION N/A 10/01/2020  ? Procedure: CARDIOVERSION;  Surgeon: Werner Lean, MD;  Location: [place] ENDOSCOPY;  Service: Cardiovascular;  Laterality: N/A;  ? CATARACT EXTRACTION    ? COLONOSCOPY WITH PROPOFOL N/A 02/06/2018  ? Procedure: COLONOSCOPY WITH PROPOFOL;  Surgeon: Yetta Flock, MD;  Location: [facility] ENDOSCOPY;  Service: Gastroenterology;  Laterality: N/A;  ? EYE SURGERY    ? ICD GENERATOR CHANGEOUT N/A 01/17/2020  ? Procedure: ICD GENERATOR CHANGEOUT;  Surgeon: Evans Lance, MD;  Location: [place] CV LAB;  Service: Cardiovascular;  Laterality: N/A;  ? LASEK eye surgery Bilateral 05/2018  ? LASIK Bilateral   ? PACEMAKER INSERTION    ? POLYPECTOMY  02/06/2018  ? Procedure: POLYPECTOMY;  Surgeon: Yetta Flock, MD;  Location: [facility] ENDOSCOPY;  Service: Gastroenterology;;  ? TEE WITHOUT CARDIOVERSION N/A 01/07/2014  ? Procedure: TRANSESOPHAGEAL ECHOCARDIOGRAM (TEE);  Surgeon: Lelon Perla, MD;  Location: [place];  Service: Cardiovascular;  Laterality: N/A;  ? TEE WITHOUT CARDIOVERSION N/A 02/11/2014  ? Procedure: TRANSESOPHAGEAL ECHOCARDIOGRAM (TEE);  Surgeon: Pixie Casino, MD;  Location: [place];  Service: Cardiovascular;  Laterality: N/A;  ? TEE WITHOUT CARDIOVERSION N/A 10/01/2020  ? Procedure: TRANSESOPHAGEAL ECHOCARDIOGRAM (TEE);  Surgeon: Werner Lean, MD;  Location: [facility] ENDOSCOPY;  Service: Cardiovascular;  Laterality: N/A;  ? TONSILLECTOMY    ? TRANSTHORACIC ECHOCARDIOGRAM  10/2006, 12/2009  ? ?Family History  ?Problem Relation Age of Onset  ? Breast cancer Mother   ? Multiple myeloma Mother   ? Prostate cancer Father   ? Diabetes Father   ? Peripheral vascular disease Father   ? Cerebral palsy Daughter   ? Lung disease Neg Hx   ? Colon cancer Neg Hx   ? Stomach cancer Neg Hx   ? Esophageal cancer Neg Hx   ? Colon polyps Neg Hx   ? ?Social History  ? ?Tobacco Use  ? Smoking status: Former  ?  Packs/day: 0.50  ?  Years: 0.50  ?  Pack years: 0.25  ?  Types: Cigarettes  ?  Quit date: 05/02/2010  ?  Years since quitting: 11.3  ? Smokeless tobacco: Never  ? Tobacco comments:  ?  Significant Second-hand and only 3 months himself  ?Vaping Use  ? Vaping Use: Never used  ?Substance Use Topics  ? Alcohol use: Not Currently  ?  Alcohol/week: 0.0 standard drinks  ?  Comment: rare  ? Drug use: No  ?  Comment: Reported history of cocaine abuse off and on   ? ?Current Outpatient Medications  ?Medication Sig Dispense Refill  ? acetaminophen (TYLENOL) 500 MG tablet Take 1,000 mg by mouth every 6 (six) hours as needed for moderate pain or headache.    ? albuterol (PROVENTIL) (2.5 MG/3ML) 0.083% nebulizer solution Take 3 mLs (2.5 mg total) by nebulization every 6 (six) hours as needed for wheezing or  shortness of breath. 150 mL 6  ? albuterol (VENTOLIN HFA) 108 (90 Base) MCG/ACT inhaler Inhale 2 puffs into the lungs every 6 (six) hours as needed for wheezing or shortness of breath. 8.5 g 6  ? alprazolam (XANAX) 2 MG tablet Take 2 mg by mouth every 6 (six) hours as needed for anxiety.    ? amiodarone (PACERONE) 200 MG tablet Take 1 tablet (200 mg total) by mouth daily. 180 tablet 3  ? atorvastatin (LIPITOR) 10 MG tablet Take 1 tablet (10 mg total) by mouth daily. 30 tablet 11  ? carvedilol (COREG) 12.5 MG tablet Take 1.5 tabs PO twice a day PO 270 tablet 3  ? dapagliflozin propanediol (FARXIGA) 10 MG TABS tablet Take  1 tablet (10 mg total) by mouth daily before breakfast. 30 tablet 6  ? DULoxetine (CYMBALTA) 30 MG capsule Take 90 mg by mouth daily.     ? entecavir (BARACLUDE) 0.5 MG tablet Take 1 tablet (0.5 mg total) by mouth every other day. Must have appointment for Further refills! Thanks 45 tablet 0  ? ezetimibe (ZETIA) 10 MG tablet TAKE 1 TABLET(10 MG) BY MOUTH DAILY 90 tablet 2  ? ferrous sulfate 325 (65 FE) MG tablet Take 325 mg by mouth daily with breakfast.    ? fluticasone (FLONASE) 50 MCG/ACT nasal spray SHAKE LIQUID AND USE 1 SPRAY IN EACH NOSTRIL DAILY 16 g 4  ? furosemide (LASIX) 40 MG tablet Take 0.5 tablets (20 mg total) by mouth daily. 45 tablet 1  ? hydrALAZINE (APRESOLINE) 100 MG tablet Take 1 tablet (100 mg total) by mouth in the morning and at bedtime. (Patient taking differently: Take 100 mg by mouth 3 (three) times daily.) 180 tablet 1  ? ondansetron (ZOFRAN-ODT) 8 MG disintegrating tablet Take 8 mg by mouth 2 (two) times daily as needed for nausea or vomiting.    ? oxybutynin (DITROPAN) 5 MG tablet TAKE 1 TABLET(5 MG) BY MOUTH THREE TIMES DAILY 90 tablet 2  ? polyethylene glycol (MIRALAX) 17 g packet Take as directed, Daily, titrate as needed 14 each 0  ? sacubitril-valsartan (ENTRESTO) 49-51 MG Take 1 tablet by mouth 2 (two) times daily. 180 tablet 0  ? sildenafil (REVATIO) 20 MG tablet  Take 2-5 tablets by mouth once daily as needed prior to sexual activity. 100 tablet 3  ? SYMBICORT 160-4.5 MCG/ACT inhaler Inhale 2 puffs into the lungs 2 (two) times daily. 10.2 g 6  ? tamsulosin (FLOMAX) 0.4 MG

## 2021-08-23 ENCOUNTER — Ambulatory Visit (INDEPENDENT_AMBULATORY_CARE_PROVIDER_SITE_OTHER): Payer: Medicaid Other

## 2021-08-23 DIAGNOSIS — Z95810 Presence of automatic (implantable) cardiac defibrillator: Secondary | ICD-10-CM

## 2021-08-23 DIAGNOSIS — I5022 Chronic systolic (congestive) heart failure: Secondary | ICD-10-CM

## 2021-08-25 ENCOUNTER — Telehealth: Payer: Self-pay

## 2021-08-25 NOTE — Telephone Encounter (Signed)
LMOVM for the patient to send missed ICM transmission. ?

## 2021-08-26 ENCOUNTER — Other Ambulatory Visit: Payer: Self-pay | Admitting: Physician Assistant

## 2021-08-26 NOTE — Telephone Encounter (Signed)
Pt's pharmacy is requesting a refill on sildenafil. Would Dr. Cooper like to refill this medication? Please address 

## 2021-08-26 NOTE — Progress Notes (Signed)
EPIC Encounter for ICM Monitoring ? ?Patient Name: Cody Ortega is a 61 y.o. male ?Date: 08/26/2021 ?Primary Care Physican: Ladell Pier, MD ?Primary Cardiologist: Burt Knack ?Electrophysiologist: Lovena Le ?08/05/2021 Office Weight: 274 lbs  ?                                                         ?  ?Spoke with patient and heart failure questions reviewed.  Pt asymptomatic for fluid accumulation.  Reports feeling well at this time and voices no complaints.  ?  ?Optivol thoracic impedance suggesting normal fluid levels.   ?  ?Prescribed:  ?Furosemide 40 mg take 0.5 tablet (20 mg total) by mouth daily. ?  ?Labs: ?07/20/2021 Creatinine 2.33, BUN 33, Potassium 3.8, Sodium 137, GFR 30 ?A complete set of results can be found in Results Review. ?  ?Recommendations: No changes and encouraged to call if experiencing any fluid symptoms. ?  ?Follow-up plan: ICM clinic phone appointment on 09/28/2021.   91 day device clinic remote transmission 10/15/2021.   ?  ?EP/Cardiology Office Visits:  Advised next appointment with Dr Burt Knack is due in July (last appt 11/04/2020 and no recall). Recall 05/04/2022 with Tommye Standard, PA or Oda Kilts, [place]. ?  ?Copy of ICM check sent to Dr. Lovena Le. ?  ?3 month ICM trend: 08/25/2021. ? ? ? ?12-14 Month ICM trend:  ? ? ? ?Rosalene Billings, RN ?08/26/2021 ?8:52 AM ? ?

## 2021-08-27 NOTE — Telephone Encounter (Signed)
Pt current on appt and has next OV scheduled for 8/23. Attempted to call patient, but no answer.  ?

## 2021-08-29 DIAGNOSIS — J9601 Acute respiratory failure with hypoxia: Secondary | ICD-10-CM | POA: Diagnosis not present

## 2021-08-29 DIAGNOSIS — J690 Pneumonitis due to inhalation of food and vomit: Secondary | ICD-10-CM | POA: Diagnosis not present

## 2021-08-29 DIAGNOSIS — I5020 Unspecified systolic (congestive) heart failure: Secondary | ICD-10-CM | POA: Diagnosis not present

## 2021-08-29 DIAGNOSIS — G4733 Obstructive sleep apnea (adult) (pediatric): Secondary | ICD-10-CM | POA: Diagnosis not present

## 2021-08-30 DIAGNOSIS — J690 Pneumonitis due to inhalation of food and vomit: Secondary | ICD-10-CM | POA: Diagnosis not present

## 2021-08-30 DIAGNOSIS — J9601 Acute respiratory failure with hypoxia: Secondary | ICD-10-CM | POA: Diagnosis not present

## 2021-08-30 DIAGNOSIS — I5020 Unspecified systolic (congestive) heart failure: Secondary | ICD-10-CM | POA: Diagnosis not present

## 2021-09-01 DIAGNOSIS — G4733 Obstructive sleep apnea (adult) (pediatric): Secondary | ICD-10-CM | POA: Diagnosis not present

## 2021-09-07 ENCOUNTER — Other Ambulatory Visit: Payer: Self-pay | Admitting: Cardiovascular Disease

## 2021-09-07 NOTE — Telephone Encounter (Signed)
Prescription refill request for Xarelto received.  ?Indication: Atrial Fib ?Last office visit: 08/05/21  Elliot Cousin MD ?Weight: 123.6kg ?Age: 61 ?Scr: 2.33 on 07/20/21 ?CrCl: 60.41 ? ?Based on above findings Xarelto '20mg'$  daily is the appropriate dose.  Refill approved. ? ?

## 2021-09-21 ENCOUNTER — Other Ambulatory Visit: Payer: Self-pay | Admitting: Cardiovascular Disease

## 2021-09-21 DIAGNOSIS — I480 Paroxysmal atrial fibrillation: Secondary | ICD-10-CM

## 2021-09-21 NOTE — Telephone Encounter (Signed)
Xarelto '20mg'$  refill request received. Pt is 61 years old, weight-125.2kg, Crea-2.33 on 07/20/2021, last seen by Dr. Curt Bears on 08/05/2021, Diagnosis-Afib, CrCl-61.59m/min; Dose is appropriate based on dosing criteria. Will send in refill to requested pharmacy.

## 2021-09-22 DIAGNOSIS — F639 Impulse disorder, unspecified: Secondary | ICD-10-CM | POA: Diagnosis not present

## 2021-09-22 DIAGNOSIS — F330 Major depressive disorder, recurrent, mild: Secondary | ICD-10-CM | POA: Diagnosis not present

## 2021-09-22 DIAGNOSIS — F410 Panic disorder [episodic paroxysmal anxiety] without agoraphobia: Secondary | ICD-10-CM | POA: Diagnosis not present

## 2021-09-23 DIAGNOSIS — Z0271 Encounter for disability determination: Secondary | ICD-10-CM

## 2021-09-29 DIAGNOSIS — J9601 Acute respiratory failure with hypoxia: Secondary | ICD-10-CM | POA: Diagnosis not present

## 2021-09-29 DIAGNOSIS — J690 Pneumonitis due to inhalation of food and vomit: Secondary | ICD-10-CM | POA: Diagnosis not present

## 2021-09-29 DIAGNOSIS — I5020 Unspecified systolic (congestive) heart failure: Secondary | ICD-10-CM | POA: Diagnosis not present

## 2021-09-29 DIAGNOSIS — G4733 Obstructive sleep apnea (adult) (pediatric): Secondary | ICD-10-CM | POA: Diagnosis not present

## 2021-09-30 ENCOUNTER — Telehealth: Payer: Self-pay

## 2021-09-30 ENCOUNTER — Other Ambulatory Visit: Payer: Self-pay | Admitting: Internal Medicine

## 2021-09-30 DIAGNOSIS — I5020 Unspecified systolic (congestive) heart failure: Secondary | ICD-10-CM | POA: Diagnosis not present

## 2021-09-30 DIAGNOSIS — J690 Pneumonitis due to inhalation of food and vomit: Secondary | ICD-10-CM | POA: Diagnosis not present

## 2021-09-30 DIAGNOSIS — J9601 Acute respiratory failure with hypoxia: Secondary | ICD-10-CM | POA: Diagnosis not present

## 2021-09-30 NOTE — Telephone Encounter (Signed)
I spoke with the patient about missed transmission. He is going to call the office when he get home for help with his monitor.

## 2021-10-01 NOTE — Progress Notes (Signed)
No ICM remote transmission received for 09/28/2021 and next ICM transmission scheduled for 10/18/2021.

## 2021-10-11 ENCOUNTER — Other Ambulatory Visit: Payer: Self-pay | Admitting: Internal Medicine

## 2021-10-11 DIAGNOSIS — J302 Other seasonal allergic rhinitis: Secondary | ICD-10-CM

## 2021-10-11 NOTE — Progress Notes (Addendum)
[facility] Note  10/13/2021     CHIEF COMPLAINT Patient presents for Retina Follow Up    HISTORY OF PRESENT ILLNESS: Cody Ortega is a 61 y.o. male who presents to the clinic today for:   HPI     Retina Follow Up   Patient presents with  Other.  In right eye.  Duration of 9 months.  Since onset it is stable.  I, the attending physician,  performed the HPI with the patient and updated documentation appropriately.        Comments   9 month follow up BRAO OD-  Vision is a little fuzzy today.  He ran out of ATs.       Last edited by Bernarda Caffey, MD on 10/16/2021 12:14 AM.       Referring physician: Ladell Pier, MD [place],  [place] [address]  HISTORICAL INFORMATION:   Selected notes from the MEDICAL RECORD NUMBER Referred by Dr. [name] for decreased vision and floaters x3 days    CURRENT MEDICATIONS: No current outpatient medications on file. (Ophthalmic Drugs)   No current facility-administered medications for this visit. (Ophthalmic Drugs)   Current Outpatient Medications (Other)  Medication Sig   acetaminophen (TYLENOL) 500 MG tablet Take 1,000 mg by mouth every 6 (six) hours as needed for moderate pain or headache.   albuterol (PROVENTIL) (2.5 MG/3ML) 0.083% nebulizer solution Take 3 mLs (2.5 mg total) by nebulization every 6 (six) hours as needed for wheezing or shortness of breath.   albuterol (VENTOLIN HFA) 108 (90 Base) MCG/ACT inhaler Inhale 2 puffs into the lungs every 6 (six) hours as needed for wheezing or shortness of breath.   alprazolam (XANAX) 2 MG tablet Take 2 mg by mouth every 6 (six) hours as needed for anxiety.   amiodarone (PACERONE) 200 MG tablet Take 1 tablet (200 mg total) by mouth daily.   atorvastatin (LIPITOR) 10 MG tablet Take 1 tablet (10 mg total) by mouth daily.   carvedilol (COREG) 12.5 MG tablet Take 1.5 tabs PO twice a day PO   dapagliflozin propanediol (FARXIGA) 10 MG  TABS tablet Take 1 tablet (10 mg total) by mouth daily before breakfast.   DULoxetine (CYMBALTA) 30 MG capsule Take 90 mg by mouth daily.    entecavir (BARACLUDE) 0.5 MG tablet Take 1 tablet (0.5 mg total) by mouth every other day.   ezetimibe (ZETIA) 10 MG tablet TAKE 1 TABLET(10 MG) BY MOUTH DAILY   ferrous sulfate 325 (65 FE) MG tablet Take 325 mg by mouth daily with breakfast.   fluticasone (FLONASE) 50 MCG/ACT nasal spray SHAKE LIQUID AND USE 1 SPRAY IN EACH NOSTRIL DAILY   furosemide (LASIX) 40 MG tablet Take 0.5 tablets (20 mg total) by mouth daily.   hydrALAZINE (APRESOLINE) 100 MG tablet Take 1 tablet (100 mg total) by mouth in the morning and at bedtime. (Patient taking differently: Take 100 mg by mouth 3 (three) times daily.)   ondansetron (ZOFRAN-ODT) 8 MG disintegrating tablet Take 8 mg by mouth 2 (two) times daily as needed for nausea or vomiting.   oxybutynin (DITROPAN) 5 MG tablet TAKE 1 TABLET(5 MG) BY MOUTH THREE TIMES DAILY   polyethylene glycol (MIRALAX) 17 g packet Take as directed, Daily, titrate as needed   sacubitril-valsartan (ENTRESTO) 49-51 MG Take 1 tablet by mouth 2 (two) times daily.   sildenafil (REVATIO) 20 MG tablet TAKE 2-5 TABLETS BY MOUTH ONCE DAILY AS NEEDED  PRIOR TO SEXUAL ACTIVITY   SYMBICORT 160-4.5 MCG/ACT inhaler Inhale 2 puffs into the lungs 2 (two) times daily.   tamsulosin (FLOMAX) 0.4 MG CAPS capsule TAKE 1 CAPSULE(0.4 MG) BY MOUTH DAILY   XARELTO 20 MG TABS tablet TAKE 1 TABLET(20 MG) BY MOUTH DAILY WITH SUPPER   No current facility-administered medications for this visit. (Other)   REVIEW OF SYSTEMS: ROS   Positive for: Gastrointestinal, Neurological, Genitourinary, Musculoskeletal, Endocrine, Cardiovascular, Eyes, Respiratory Negative for: Constitutional, Skin, HENT, Psychiatric, Allergic/Imm, Heme/Lymph Last edited by Leonie Douglas, COA on 10/13/2021  1:07 PM.     ALLERGIES Allergies  Allergen Reactions   Prochlorperazine Other (See  Comments)    Medication caused a lot of issues like blurred vision, nausea, pain, and carpool turnel   Codeine Itching, Nausea And Vomiting and Hives   Hydrocodone-Acetaminophen Itching and Nausea And Vomiting   PAST MEDICAL HISTORY Past Medical History:  Diagnosis Date   Anxiety    Arthritis    Benzodiazepine dependence ([place])    Bronchial asthma    CAD (coronary artery disease)    a. Cath 03/2010: mod RCA stenosis, severe diag stenosis, treated medically given lack of angina.   Cardiomyopathy    possible cocaine induced   Chronic systolic congestive heart failure (HCC)    Cirrhosis (HCC)    secondary to hepatitis B   CKD (chronic kidney disease), stage IV (HCC)    Stage 3-4   Depression    Diverticulosis    Hepatitis B    History of cocaine abuse ([place])    Hyperlipidemia LDL goal <70    Hypermetropia of both eyes not needing correction 09/29/2020   Hypertension    Hypertensive retinopathy    Microcytic anemia    Obesity    Persistent atrial fibrillation ([place])    a. Episode in 2008 with spontaneous conversion, on amiodarone per EP.   S/P implantation of automatic cardioverter/defibrillator (AICD)    a. Medtronic, implanted 2012.   Sleep apnea    Stroke [facility]) 2004   Thyroid disease    Past Surgical History:  Procedure Laterality Date   CARDIOVERSION N/A 01/07/2014   Procedure: CARDIOVERSION;  Surgeon: Lelon Perla, MD;  Location: [facility] ENDOSCOPY;  Service: Cardiovascular;  Laterality: N/A;   CARDIOVERSION N/A 02/11/2014   Procedure: CARDIOVERSION;  Surgeon: Pixie Casino, MD;  Location: [place];  Service: Cardiovascular;  Laterality: N/A;   CARDIOVERSION N/A 10/01/2020   Procedure: CARDIOVERSION;  Surgeon: Werner Lean, MD;  Location: [place] ENDOSCOPY;  Service: Cardiovascular;  Laterality: N/A;   CATARACT EXTRACTION     COLONOSCOPY WITH PROPOFOL N/A 02/06/2018   Procedure: COLONOSCOPY WITH PROPOFOL;  Surgeon: Yetta Flock, MD;  Location: [facility]  ENDOSCOPY;  Service: Gastroenterology;  Laterality: N/A;   EYE SURGERY     ICD GENERATOR CHANGEOUT N/A 01/17/2020   Procedure: ICD GENERATOR CHANGEOUT;  Surgeon: Evans Lance, MD;  Location: [place] CV LAB;  Service: Cardiovascular;  Laterality: N/A;   LASEK eye surgery Bilateral 05/2018   LASIK Bilateral    PACEMAKER INSERTION     POLYPECTOMY  02/06/2018   Procedure: POLYPECTOMY;  Surgeon: Yetta Flock, MD;  Location: [facility] ENDOSCOPY;  Service: Gastroenterology;;   TEE WITHOUT CARDIOVERSION N/A 01/07/2014   Procedure: TRANSESOPHAGEAL ECHOCARDIOGRAM (TEE);  Surgeon: Lelon Perla, MD;  Location[facility] ENDOSCOPY;  Service: Cardiovascular;  Laterality: N/A;   TEE WITHOUT CARDIOVERSION N/A 02/11/2014   Procedure: TRANSESOPHAGEAL ECHOCARDIOGRAM (TEE);  Surgeon: Pixie Casino, MD;  Location: [facility] ENDOSCOPY;  Service: Cardiovascular;  Laterality: N/A;   TEE WITHOUT CARDIOVERSION N/A 10/01/2020   Procedure: TRANSESOPHAGEAL ECHOCARDIOGRAM (TEE);  Surgeon: Werner Lean, MD;  Location: [facility] ENDOSCOPY;  Service: Cardiovascular;  Laterality: N/A;   TONSILLECTOMY     TRANSTHORACIC ECHOCARDIOGRAM  10/2006, 12/2009   FAMILY HISTORY Family History  Problem Relation Age of Onset   Breast cancer Mother    Multiple myeloma Mother    Prostate cancer Father    Diabetes Father    Peripheral vascular disease Father    Cerebral palsy Daughter    Lung disease Neg Hx    Colon cancer Neg Hx    Stomach cancer Neg Hx    Esophageal cancer Neg Hx    Colon polyps Neg Hx    SOCIAL HISTORY Social History   Tobacco Use   Smoking status: Former    Packs/day: 0.50    Years: 0.50    Total pack years: 0.25    Types: Cigarettes    Quit date: 05/02/2010    Years since quitting: 11.4   Smokeless tobacco: Never   Tobacco comments:    Significant Second-hand and only 3 months himself  Vaping Use   Vaping Use: Never used  Substance Use Topics   Alcohol use: Not Currently    Alcohol/week: 0.0  standard drinks of alcohol    Comment: rare   Drug use: No    Comment: Reported history of cocaine abuse off and on        OPHTHALMIC EXAM:  Base Eye Exam     Visual Acuity (Snellen - Linear)       Right Left   Dist [place] 20/30 20/25 -2   Dist ph [place] 20/25 20/25 +1         Tonometry (Tonopen, 1:16 PM)       Right Left   Pressure 6 15         Pupils       Dark Light Shape React APD   Right 3 2 Round Brisk None   Left 3 2 Round Brisk None         Visual Fields (Counting fingers)       Left Right    Full    Restrictions  Total superior nasal deficiency         Extraocular Movement       Right Left    Full Full         Neuro/Psych     Oriented x3: Yes   Mood/Affect: Normal         Dilation     Both eyes: 1.0% Mydriacyl, 2.5% Phenylephrine @ 1:16 PM           Slit Lamp and Fundus Exam     Slit Lamp Exam       Right Left   Lids/Lashes Dermatochalasis - upper lid, mild Meibomian gland dysfunction Dermatochalasis - upper lid, mild Meibomian gland dysfunction   Conjunctiva/Sclera White and quiet White and quiet   Cornea Mild arcus, well healed temporal cataract wounds, mild tear film debris, 1+ Punctate epithelial erosions Mild Debris in tear film, well healed temporal cataract wounds, mild Arcus, 1-2+ inferior Punctate epithelial erosions   Anterior Chamber Deep and quiet Deep and quiet   Iris Round and dilated Round and dilated   Lens PC IOL in good position, trace Posterior capsular opacification PC IOL in good position, 1+ Posterior capsular opacification   Anterior Vitreous Vitreous syneresis Vitreous syneresis, PVD, no heme  Fundus Exam       Right Left   Disc Inferior Pallor, vascular loops, Sharp rim Pink and Sharp, mild temporal Pallor   C/D Ratio 0.3 0.4   Macula Flat, Blunted foveal reflex, mild Retinal pigment epithelial mottling, inferior atrophy Flat, good foveal reflex, focal RPE disruption IN fovea, no heme or edema    Vessels Severe Vascular attenuation, superior hemisphere with telangectasias and perivascular exudation / sheathing -- stable, Copper wiring, plaques in ST arterioles, Tortuous, no NV, BRAO Vascular attenuation, mild Tortuousity, mild Copper wiring, mild AV crossing changes   Periphery Attached, no heme Attached, inferior lattice, peripheral cystoid degeneration, No RT/RD           IMAGING AND PROCEDURES  Imaging and Procedures for $RemoveBefore'@TODAY'[id number]$ @  OCT, Retina - OU - Both Eyes       Right Eye Quality was good. Central Foveal Thickness: 228. Progression has been stable. Findings include no IRF, no SRF, abnormal foveal contour (Severe inner retinal atrophy inferior macula - stable).   Left Eye Quality was good. Central Foveal Thickness: 236. Progression has been stable. Findings include normal foveal contour, no IRF, no SRF, retinal drusen (Focal ellipsoid disruption inferonasal fovea).   Notes *Images captured and stored on drive  Diagnosis / Impression:  No IRF/SRF OU OD: BRAO w/ severe inner retinal atrophy inferior macula - stable OS: Focal ellipsoid disruption inferonasal fovea  Clinical management:  See below  Abbreviations: NFP - Normal foveal profile. CME - cystoid macular edema. PED - pigment epithelial detachment. IRF - intraretinal fluid. SRF - subretinal fluid. EZ - ellipsoid zone. ERM - epiretinal membrane. ORA - outer retinal atrophy. ORT - outer retinal tubulation. SRHM - subretinal hyper-reflective material            ASSESSMENT/PLAN:    ICD-10-CM   1. Retinal artery branch occlusion of right eye  H34.231 OCT, Retina - OU - Both Eyes    2. Posterior vitreous detachment of left eye  H43.812 OCT, Retina - OU - Both Eyes    3. Vitreous hemorrhage of left eye (HCC)  H43.12     4. Essential hypertension  I10     5. Hypertensive retinopathy of both eyes  H35.033     6. Pseudophakia of both eyes  Z96.1     7. PCO (posterior capsular opacification), left   H26.492      1. 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 History of hemorrhagic PVD OS  - Onset late March 2021 -- presented to Dr. Jerilynn Mages. Le per pt history  - Symptoms resolved, VH cleared  - Discussed findings and prognosis  - No RT or RD on 360 scleral depressed exam  - Reviewed s/s of RT/RD  - Strict return precautions for any such RT/RD signs/symptoms  4,5. Hypertensive retinopathy OU  - discussed importance of tight BP control  - monitor  6. Pseudophakia OU  - s/p CE/IOL OU  - beautiful surgeries, doing well  - monitor  7. PCO OU  - discussed findings  - discussed treatment options  - recommend YAG capsulotomy OS  - pt wishes to schedule YAG OS September 2023  Ophthalmic Meds Ordered this visit:  No orders of the defined types were placed in this encounter.    Return for Schedule YAG OS September 2023.  There are no Patient Instructions on file for this  visit.   This document serves as a record of services personally performed by Gardiner Sleeper, MD, PhD. It was created on their behalf by Orvan Falconer, an ophthalmic technician. The creation of this record is the provider's dictation and/or activities during the visit.    Electronically signed by: Orvan Falconer, OA, 10/16/21  12:20 AM   This document serves as a record of services personally performed by Gardiner Sleeper, MD, PhD. It was created on their behalf by Leonie Douglas, an ophthalmic technician. The creation of this record is the provider's dictation and/or activities during the visit.    Electronically signed by: Leonie Douglas COA, 10/16/21  12:20 AM  Gardiner Sleeper, M.D., Ph.D. Diseases & Surgery of the Retina and Vitreous [facility]  I have reviewed the above documentation for accuracy and completeness, and I agree with the above. Gardiner Sleeper, M.D., Ph.D. 10/16/21 12:20  AM  Abbreviations: M myopia (nearsighted); A astigmatism; H hyperopia (farsighted); P presbyopia; Mrx spectacle prescription;  CTL contact lenses; OD right eye; OS left eye; OU both eyes  XT exotropia; ET esotropia; PEK punctate epithelial keratitis; PEE punctate epithelial erosions; DES dry eye syndrome; MGD meibomian gland dysfunction; ATs artificial tears; PFAT's preservative free artificial tears; [place] nuclear sclerotic cataract; PSC posterior subcapsular cataract; ERM epi-retinal membrane; PVD posterior vitreous detachment; RD retinal detachment; DM diabetes mellitus; DR diabetic retinopathy; NPDR non-proliferative diabetic retinopathy; PDR proliferative diabetic retinopathy; CSME clinically significant macular edema; DME diabetic macular edema; dbh dot blot hemorrhages; CWS cotton wool spot; POAG primary open angle glaucoma; C/D cup-to-disc ratio; HVF humphrey visual field; GVF goldmann visual field; OCT optical coherence tomography; IOP intraocular pressure; BRVO Branch retinal vein occlusion; CRVO central retinal vein occlusion; CRAO central retinal artery occlusion; BRAO branch retinal artery occlusion; RT retinal tear; SB scleral buckle; PPV pars plana vitrectomy; VH Vitreous hemorrhage; PRP panretinal laser photocoagulation; IVK intravitreal kenalog; VMT vitreomacular traction; MH Macular hole;  NVD neovascularization of the disc; NVE neovascularization elsewhere; AREDS age related eye disease study; ARMD age related macular degeneration; POAG primary open angle glaucoma; EBMD epithelial/anterior basement membrane dystrophy; ACIOL anterior chamber intraocular lens; IOL intraocular lens; PCIOL posterior chamber intraocular lens; Phaco/IOL phacoemulsification with intraocular lens placement; [place] photorefractive keratectomy; LASIK laser assisted in situ keratomileusis; HTN hypertension; DM diabetes mellitus; COPD chronic obstructive pulmonary disease

## 2021-10-12 NOTE — Telephone Encounter (Signed)
Refilled 08/12/2021 16g with 4 refills. Requested Prescriptions  Pending Prescriptions Disp Refills  . 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

## 2021-10-13 ENCOUNTER — Encounter (INDEPENDENT_AMBULATORY_CARE_PROVIDER_SITE_OTHER): Payer: Self-pay | Admitting: Ophthalmology

## 2021-10-13 ENCOUNTER — Ambulatory Visit (INDEPENDENT_AMBULATORY_CARE_PROVIDER_SITE_OTHER): Payer: Medicaid Other | Admitting: Ophthalmology

## 2021-10-13 DIAGNOSIS — H26492 Other secondary cataract, left eye: Secondary | ICD-10-CM | POA: Diagnosis not present

## 2021-10-13 DIAGNOSIS — H43812 Vitreous degeneration, left eye: Secondary | ICD-10-CM | POA: Diagnosis not present

## 2021-10-13 DIAGNOSIS — I10 Essential (primary) hypertension: Secondary | ICD-10-CM

## 2021-10-13 DIAGNOSIS — H35033 Hypertensive retinopathy, bilateral: Secondary | ICD-10-CM | POA: Diagnosis not present

## 2021-10-13 DIAGNOSIS — H34231 Retinal artery branch occlusion, right eye: Secondary | ICD-10-CM | POA: Diagnosis not present

## 2021-10-13 DIAGNOSIS — Z961 Presence of intraocular lens: Secondary | ICD-10-CM

## 2021-10-13 DIAGNOSIS — H4312 Vitreous hemorrhage, left eye: Secondary | ICD-10-CM | POA: Diagnosis not present

## 2021-10-15 ENCOUNTER — Ambulatory Visit (INDEPENDENT_AMBULATORY_CARE_PROVIDER_SITE_OTHER): Payer: Medicaid Other

## 2021-10-15 DIAGNOSIS — I495 Sick sinus syndrome: Secondary | ICD-10-CM | POA: Diagnosis not present

## 2021-10-16 ENCOUNTER — Encounter (INDEPENDENT_AMBULATORY_CARE_PROVIDER_SITE_OTHER): Payer: Self-pay | Admitting: Ophthalmology

## 2021-10-16 LAB — CUP PACEART REMOTE DEVICE CHECK
Battery Remaining Longevity: 127 mo
Battery Voltage: 3.02 V
Brady Statistic RV Percent Paced: 0.55 %
Date Time Interrogation Session: 20230616042406
HighPow Impedance: 48 Ohm
HighPow Impedance: 62 Ohm
Implantable Lead Implant Date: 20120806
Implantable Lead Location: 753860
Implantable Lead Model: 185
Implantable Lead Serial Number: 345870
Implantable Pulse Generator Implant Date: 20210917
Lead Channel Impedance Value: 266 Ohm
Lead Channel Impedance Value: 285 Ohm
Lead Channel Pacing Threshold Amplitude: 1.5 V
Lead Channel Pacing Threshold Pulse Width: 0.4 ms
Lead Channel Sensing Intrinsic Amplitude: 6.875 mV
Lead Channel Sensing Intrinsic Amplitude: 6.875 mV
Lead Channel Setting Pacing Amplitude: 3 V
Lead Channel Setting Pacing Pulse Width: 0.4 ms
Lead Channel Setting Sensing Sensitivity: 0.3 mV

## 2021-10-18 ENCOUNTER — Ambulatory Visit (INDEPENDENT_AMBULATORY_CARE_PROVIDER_SITE_OTHER): Payer: Medicaid Other

## 2021-10-18 DIAGNOSIS — Z95810 Presence of automatic (implantable) cardiac defibrillator: Secondary | ICD-10-CM

## 2021-10-18 DIAGNOSIS — I5022 Chronic systolic (congestive) heart failure: Secondary | ICD-10-CM

## 2021-10-21 NOTE — Progress Notes (Signed)
EPIC Encounter for ICM Monitoring  Patient Name: Cody Ortega is a 61 y.o. male Date: 10/21/2021 Primary Care Physican: Ladell Pier, MD Primary Cardiologist: Burt Knack Electrophysiologist: Lovena Le 08/05/2021 Office Weight: 274 lbs                                                             Spoke with patient and heart failure questions reviewed.  Pt asymptomatic for fluid accumulation.  Reports feeling well at this time and voices no complaints.    Optivol thoracic impedance suggesting normal fluid levels.     Prescribed:  Furosemide 40 mg take 0.5 tablet (20 mg total) by mouth daily.   Labs: 07/20/2021 Creatinine 2.33, BUN 33, Potassium 3.8, Sodium 137, GFR 30 A complete set of results can be found in Results Review.   Recommendations:  No changes and encouraged to call if experiencing any fluid symptoms.   Follow-up plan: ICM clinic phone appointment on 11/29/2021.   91 day device clinic remote transmission 01/14/2022.     EP/Cardiology Office Visits:  12/22/2021 with Dr Burt Knack. Recall 05/04/2022 with Tommye Standard, PA or Oda Kilts, [place].   Copy of ICM check sent to Dr. Lovena Le.   3 month ICM trend: 10/18/2021.    12-14 Month ICM trend:     Rosalene Billings, RN 10/21/2021 11:09 AM

## 2021-10-21 NOTE — Progress Notes (Signed)
Remote ICD transmission.   

## 2021-10-29 ENCOUNTER — Emergency Department (HOSPITAL_COMMUNITY): Payer: Medicaid Other

## 2021-10-29 ENCOUNTER — Other Ambulatory Visit: Payer: Self-pay

## 2021-10-29 ENCOUNTER — Emergency Department (HOSPITAL_COMMUNITY)
Admission: EM | Admit: 2021-10-29 | Discharge: 2021-10-30 | Disposition: A | Discharge status: Home / Self Care | Payer: Medicaid Other | Attending: Emergency Medicine | Admitting: Emergency Medicine

## 2021-10-29 ENCOUNTER — Encounter (HOSPITAL_COMMUNITY): Payer: Self-pay | Admitting: Emergency Medicine

## 2021-10-29 DIAGNOSIS — Z7951 Long term (current) use of inhaled steroids: Secondary | ICD-10-CM | POA: Diagnosis not present

## 2021-10-29 DIAGNOSIS — J449 Chronic obstructive pulmonary disease, unspecified: Secondary | ICD-10-CM | POA: Diagnosis not present

## 2021-10-29 DIAGNOSIS — I130 Hypertensive heart and chronic kidney disease with heart failure and stage 1 through stage 4 chronic kidney disease, or unspecified chronic kidney disease: Secondary | ICD-10-CM | POA: Diagnosis not present

## 2021-10-29 DIAGNOSIS — Z7901 Long term (current) use of anticoagulants: Secondary | ICD-10-CM | POA: Diagnosis not present

## 2021-10-29 DIAGNOSIS — R778 Other specified abnormalities of plasma proteins: Secondary | ICD-10-CM | POA: Insufficient documentation

## 2021-10-29 DIAGNOSIS — N184 Chronic kidney disease, stage 4 (severe): Secondary | ICD-10-CM | POA: Diagnosis not present

## 2021-10-29 DIAGNOSIS — I5022 Chronic systolic (congestive) heart failure: Secondary | ICD-10-CM | POA: Insufficient documentation

## 2021-10-29 DIAGNOSIS — Z79899 Other long term (current) drug therapy: Secondary | ICD-10-CM | POA: Insufficient documentation

## 2021-10-29 DIAGNOSIS — R079 Chest pain, unspecified: Secondary | ICD-10-CM | POA: Diagnosis not present

## 2021-10-29 DIAGNOSIS — R0602 Shortness of breath: Secondary | ICD-10-CM | POA: Insufficient documentation

## 2021-10-29 DIAGNOSIS — J45909 Unspecified asthma, uncomplicated: Secondary | ICD-10-CM | POA: Insufficient documentation

## 2021-10-29 DIAGNOSIS — R0789 Other chest pain: Secondary | ICD-10-CM | POA: Insufficient documentation

## 2021-10-29 DIAGNOSIS — I2510 Atherosclerotic heart disease of native coronary artery without angina pectoris: Secondary | ICD-10-CM | POA: Insufficient documentation

## 2021-10-29 DIAGNOSIS — I700 Atherosclerosis of aorta: Secondary | ICD-10-CM | POA: Diagnosis not present

## 2021-10-29 LAB — CBC
HCT: 49.6 % (ref 39.0–52.0)
Hemoglobin: 16.6 g/dL (ref 13.0–17.0)
MCH: 31.4 pg (ref 26.0–34.0)
MCHC: 33.5 g/dL (ref 30.0–36.0)
MCV: 93.8 fL (ref 80.0–100.0)
Platelets: 195 10*3/uL (ref 150–400)
RBC: 5.29 MIL/uL (ref 4.22–5.81)
RDW: 13.7 % (ref 11.5–15.5)
WBC: 8.9 10*3/uL (ref 4.0–10.5)
nRBC: 0 % (ref 0.0–0.2)

## 2021-10-29 LAB — BASIC METABOLIC PANEL
Anion gap: 10 (ref 5–15)
BUN: 29 mg/dL — ABNORMAL HIGH (ref 6–20)
CO2: 25 mmol/L (ref 22–32)
Calcium: 9.4 mg/dL (ref 8.9–10.3)
Chloride: 106 mmol/L (ref 98–111)
Creatinine, Ser: 2.58 mg/dL — ABNORMAL HIGH (ref 0.61–1.24)
GFR, Estimated: 28 mL/min — ABNORMAL LOW (ref >60)
Glucose, Bld: 95 mg/dL (ref 70–99)
Potassium: 4.2 mmol/L (ref 3.5–5.1)
Sodium: 141 mmol/L (ref 135–145)

## 2021-10-29 LAB — TROPONIN I (HIGH SENSITIVITY): Troponin I (High Sensitivity): 23 ng/L — ABNORMAL HIGH (ref <18)

## 2021-10-29 MED ORDER — ALBUTEROL SULFATE HFA 108 (90 BASE) MCG/ACT IN AERS
2.0000 | INHALATION_SPRAY | RESPIRATORY_TRACT | Status: DC | PRN
  Administered 2021-10-30: 2 via RESPIRATORY_TRACT
  Filled 2021-10-29: qty 6.7

## 2021-10-29 NOTE — ED Triage Notes (Addendum)
Pt reported to ED with c/o shortness of breath and chest pain/ heaviness. Pt has rapid and excessive speech, rarely taking pauses between ideas and appears anxious, reports hx of same, states he has taken his anxiety medication PTA. States that he was arguing with his gf during onset of symptoms but also relates it to weather and states problems have been ongoing for past several days. Pt endorses HTN and states he has taken medications as ordered. No distress noted at time of triage.

## 2021-10-30 DIAGNOSIS — J9601 Acute respiratory failure with hypoxia: Secondary | ICD-10-CM | POA: Diagnosis not present

## 2021-10-30 DIAGNOSIS — J690 Pneumonitis due to inhalation of food and vomit: Secondary | ICD-10-CM | POA: Diagnosis not present

## 2021-10-30 DIAGNOSIS — I5020 Unspecified systolic (congestive) heart failure: Secondary | ICD-10-CM | POA: Diagnosis not present

## 2021-10-30 LAB — TROPONIN I (HIGH SENSITIVITY): Troponin I (High Sensitivity): 24 ng/L — ABNORMAL HIGH (ref <18)

## 2021-10-30 NOTE — ED Provider Notes (Signed)
[facility] EMERGENCY DEPARTMENT Provider Note  CSN: [id number] Arrival date & time: 10/29/21 2301  Chief Complaint(s) Chest Pain and Shortness of Breath  HPI Cody Ortega is a 61 y.o. male with a past medical history listed below including anxiety, chronic systolic heart failure, CKD, hypertension, hyperlipidemia who presents to the emergency department with chest tightness and shortness of breath.  This began around 10 PM in the setting of emotional stress.  He reports that he and his girlfriend got into a verbal argument.  He began having shortness of breath and developed chest tightness which she describes as tightening of his chest wall muscles.  He states that he was unable to take in a deep breath.  Pain was nonradiating and nonexertional.  He attributed to increased anxiety and took a Xanax which did not provide immediate relief prompting his visit to the emergency department.  Upon arrival to the emergency department he reports that he was able to take in a deep breath and he no longer had any chest discomfort or shortness of breath.  The history is provided by the patient.    Past Medical History Past Medical History:  Diagnosis Date   Anxiety    Arthritis    Benzodiazepine dependence (HCC)    Bronchial asthma    CAD (coronary artery disease)    a. Cath 03/2010: mod RCA stenosis, severe diag stenosis, treated medically given lack of angina.   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 Episode in 2008  with spontaneous conversion, on amiodarone per EP.   S/P implantation of automatic cardioverter/defibrillator (AICD)    a. Medtronic, implanted 2012.   Sleep apnea    Stroke [facility]) 2004   Thyroid disease    Patient Active Problem List   Diagnosis Date Noted   Class 2 severe obesity with serious comorbidity and body mass index (BMI) of 35.0 to 35.9 in adult, unspecified obesity type ([place]) 08/12/2021   Vitreous hemorrhage of left eye ([place]) 08/12/2021   Chronic hypoxemic respiratory failure ([place]) 12/12/2020   Secondary hypercoagulable state ([place]) 10/14/2020   Acute respiratory failure with hypoxia ([place]) 10/03/2020   Aspiration pneumonia of both lower lobes due to gastric secretions ([place]) 10/03/2020   Aspiration pneumonia ([place]) 10/03/2020   HFrEF (heart failure with reduced ejection fraction) ([place])    Stage 3b chronic kidney disease ([place]) 09/05/2019   BPH (benign prostatic hyperplasia) 06/19/2019   Dental abscess 04/30/2018   Adenomatous polyp of colon 02/09/2018   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 agent with hepatic coma 02/09/2018   Osteoarthritis of both knees 06/12/2017   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   Chronic seasonal allergic rhinitis 03/17/2016   GERD (gastroesophageal reflux disease) 03/17/2016   Chronic pain syndrome 03/16/2015   Obstructive sleep apnea 03/16/2015   Chronic low back pain with right-sided sciatica 03/16/2015   Sinus brady-tachy syndrome ([place]) 02/20/2014   Atrial fibrillation ([place]) 12/31/2013  Automatic implantable cardioverter-defibrillator in situ 03/08/2011   Erectile dysfunction 02/28/2011   CAD, NATIVE VESSEL 04/26/2010   PURE HYPERCHOLESTEROLEMIA 04/12/2010   HLD (hyperlipidemia) 01/11/2010   Essential hypertension, malignant 95/63/8756   Chronic systolic heart failure ([place]) 12/18/2009   Home Medication(s) Prior to Admission medications   Medication Sig Start Date End Date Taking? Authorizing Provider  acetaminophen (TYLENOL) 500 MG tablet  Take 1,000 mg by mouth every 6 (six) hours as needed for moderate pain or headache.    [provider]  albuterol (PROVENTIL) (2.5 MG/3ML) 0.083% nebulizer solution Take 3 mLs (2.5 mg total) by nebulization every 6 (six) hours as needed for wheezing or shortness of breath. 04/12/21   Ladell Pier, MD  albuterol (VENTOLIN HFA) 108 (90 Base) MCG/ACT inhaler Inhale 2 puffs into the lungs every 6 (six) hours as needed for wheezing or shortness of breath. 08/12/21   Ladell Pier, MD  alprazolam Duanne Moron) 2 MG tablet Take 2 mg by mouth every 6 (six) hours as needed for anxiety.    [provider]  amiodarone (PACERONE) 200 MG tablet Take 1 tablet (200 mg total) by mouth daily. 10/14/20   Fenton, Clint R, PA  atorvastatin (LIPITOR) 10 MG tablet Take 1 tablet (10 mg total) by mouth daily. 11/19/20   [name], MD  carvedilol (COREG) 12.5 MG tablet Take 1.5 tabs PO twice a day PO 08/12/21   Ladell Pier, MD  dapagliflozin propanediol (FARXIGA) 10 MG TABS tablet Take 1 tablet (10 mg total) by mouth daily before breakfast. 06/09/21   [name], MD  DULoxetine (CYMBALTA) 30 MG capsule Take 90 mg by mouth daily.     [provider]  entecavir (BARACLUDE) 0.5 MG tablet Take 1 tablet (0.5 mg total) by mouth every other day. 08/19/21   Armbruster, Carlota Raspberry, MD  ezetimibe (ZETIA) 10 MG tablet TAKE 1 TABLET(10 MG) BY MOUTH DAILY 08/09/21   [name], MD  ferrous sulfate 325 (65 FE) MG tablet Take 325 mg by mouth daily with breakfast.    [provider]  fluticasone (FLONASE) 50 MCG/ACT nasal spray SHAKE LIQUID AND USE 1 SPRAY IN EACH NOSTRIL DAILY 08/12/21   Ladell Pier, MD  furosemide (LASIX) 40 MG tablet Take 0.5 tablets (20 mg total) by mouth daily. 06/07/21   [name], MD  hydrALAZINE (APRESOLINE) 100 MG tablet Take 1 tablet (100 mg total) by mouth in the morning and at bedtime. Patient taking differently: Take 100 mg by mouth 3 (three) times  daily. 08/05/21   Camnitz, Will Hassell Done, MD  ondansetron (ZOFRAN-ODT) 8 MG disintegrating tablet Take 8 mg by mouth 2 (two) times daily as needed for nausea or vomiting. 12/11/19   [provider]  oxybutynin (DITROPAN) 5 MG tablet TAKE 1 TABLET(5 MG) BY MOUTH THREE TIMES DAILY 09/30/21   Ladell Pier, MD  polyethylene glycol (MIRALAX) 17 g packet Take as directed, Daily, titrate as needed 02/21/20   Armbruster, Carlota Raspberry, MD  sacubitril-valsartan (ENTRESTO) 49-51 MG Take 1 tablet by mouth 2 (two) times daily. 08/13/21   [name], MD  sildenafil (REVATIO) 20 MG tablet TAKE 2-5 TABLETS BY MOUTH ONCE DAILY AS NEEDED PRIOR TO SEXUAL ACTIVITY 08/27/21   [name], MD  SYMBICORT 160-4.5 MCG/ACT inhaler Inhale 2 puffs into the lungs 2 (two) times daily. 08/12/21   Ladell Pier, MD  tamsulosin (FLOMAX) 0.4 MG CAPS capsule TAKE 1 CAPSULE(0.4 MG) BY MOUTH DAILY 05/03/21   Ladell Pier, MD  XARELTO 20 MG TABS tablet TAKE 1 TABLET(20 MG) BY MOUTH DAILY WITH SUPPER 09/21/21   Camnitz, Ocie Doyne, MD                                                                                                                                    Allergies Prochlorperazine, Codeine, and Hydrocodone-acetaminophen  Review of Systems Review of Systems As noted in HPI  Physical Exam Vital Signs  I have reviewed the triage vital signs BP (!) 172/95   Pulse 67   Temp 98.8 F (37.1 C) (Oral)   Resp (!) 23   SpO2 93%   Physical Exam Vitals reviewed.  Constitutional:      General: He is not in acute distress.    Appearance: He is well-developed. He is not diaphoretic.  HENT:     Head: Normocephalic and atraumatic.     Nose: Nose normal.  Eyes:     General: No scleral icterus.       Right eye: No discharge.        Left eye: No discharge.     Conjunctiva/sclera: Conjunctivae normal.     Pupils: Pupils are equal, round, and reactive to light.  Cardiovascular:     Rate and Rhythm: Normal  rate and regular rhythm.     Heart sounds: No murmur heard.    No friction rub. No gallop.  Pulmonary:     Effort: Pulmonary effort is normal. No respiratory distress.     Breath sounds: Normal breath sounds. No stridor. No rales.  Abdominal:     General: There is no distension.     Palpations: Abdomen is soft.     Tenderness: There is no abdominal tenderness.  Musculoskeletal:        General: No tenderness.     Cervical back: Normal range of motion and neck supple.  Skin:    General: Skin is warm and dry.     Findings: No erythema or rash.  Neurological:     Mental Status: He is alert and oriented to person, place, and time.     ED Results and Treatments Labs (all labs ordered are listed, but only abnormal results are displayed) Labs Reviewed  BASIC METABOLIC PANEL - Abnormal; Notable for the following components:      Result Value   BUN 29 (*)    Creatinine, Ser 2.58 (*)    GFR, Estimated 28 (*)    All other components within normal limits  TROPONIN I (HIGH SENSITIVITY) - Abnormal; Notable for the following components:   Troponin I (High Sensitivity) 23 (*)    All other components within normal limits  TROPONIN I (HIGH SENSITIVITY) - Abnormal; Notable for the following components:   Troponin I (High Sensitivity) 24 (*)    All other components within normal limits  CBC  EKG  EKG Interpretation  Date/Time:  Friday October 29 2021 23:11:07 EDT Ventricular Rate:  79 PR Interval:  180 QRS Duration: 110 QT Interval:  398 QTC Calculation: 456 R Axis:   31 Text Interpretation: Normal sinus rhythm Low voltage QRS Cannot rule out Anterior infarct , age undetermined Abnormal ECG When compared with ECG of 14-Oct-2020 14:03, PREVIOUS ECG IS PRESENT No significant change was found Confirmed by Addison Lank [phone]) on 10/30/2021 2:13:28 AM       Radiology DG  Chest 2 View  Result Date: 10/29/2021 CLINICAL DATA:  Chest pain and shortness of breath. EXAM: CHEST - 2 VIEW COMPARISON:  October 05, 2020 FINDINGS: A single lead ventricular pacer is seen with stable pacer wire positioning. The cardiac silhouette is mildly enlarged and unchanged in size. There is mild calcification of the aortic arch. Both lungs are clear. The visualized skeletal structures are unremarkable. IMPRESSION: No active cardiopulmonary disease. Electronically Signed   By: Virgina Norfolk M.D.   On: 10/29/2021 23:49    Pertinent labs & imaging results that were available during my care of the patient were reviewed by me and considered in my medical decision making (see MDM for details).  Medications Ordered in ED Medications  albuterol (VENTOLIN HFA) 108 (90 Base) MCG/ACT inhaler 2 puff (2 puffs Inhalation Given 10/30/21 0200)                                                                                                                                     Procedures Procedures  (including critical care time)  Medical Decision Making / ED Course    Complexity of Problem:  Co-morbidities/SDOH that complicate the patient evaluation/care: Noted in HPI  Patient's presenting problem/concern, DDX, and MDM listed below: Chest tightness and SOB Atypical for ACS however given patient's comorbidities, will obtain cardiac work-up. We will obtain chest x-ray to assess for pneumothorax pulmonary edema Low suspicion for pulmonary embolism or aortic dissection Most likely anxiety related.    Complexity of Data:   Cardiac Monitoring: The patient was maintained on a cardiac monitor.   I personally viewed and interpreted the cardiac monitored which showed an underlying rhythm of sinus rhythm with rates in the 60s to 70s EKG without acute ischemic changes  Laboratory Tests ordered listed below with my independent interpretation: CBC without leukocytosis or anemia No significant  electrolyte derangements. Renal function. Initial troponin slightly elevated at 23 but this is lower than his prior baseline of 40. Repeat troponin was flat   Imaging Studies ordered listed below with my independent interpretation: Chest x-ray without evidence of pneumonia, pneumothorax, pulmonary edema or pleural effusions.     ED Course:    Assessment, Add'l Intervention, and Reassessment: Chest tightness and SOB Work up reassuring Remained asymptomatic HDS Doubt ACS, PE, dissection, or other serious etiology    Final Clinical Impression(s) / ED Diagnoses Final diagnoses:  Chest tightness   The  patient appears reasonably screened and/or stabilized for discharge and I doubt any other medical condition or other [facility] requiring further screening, evaluation, or treatment in the ED at this time prior to discharge. Safe for discharge with strict return precautions.  Disposition: Discharge  Condition: Good  I have discussed the results, Dx and Tx plan with the patient/family who expressed understanding and agree(s) with the plan. Discharge instructions discussed at length. The patient/family was given strict return precautions who verbalized understanding of the instructions. No further questions at time of discharge.    ED Discharge Orders     None        Follow Up: Ladell Pier, MD [address] [place] [address] [phone]  Call  to schedule an appointment for close follow up           This chart was dictated using voice recognition software.  Despite best efforts to proofread,  errors can occur which can change the documentation meaning.    Fatima Blank, MD 10/30/21 [phone]

## 2021-10-30 NOTE — ED Notes (Signed)
Pt began to raise voice at this RN as this RN walked by pt's room, stating "take this IV out of my arm before I rip it out myself." RN explained to pt that he is not yet up for discharge, and thus pt may continue to require an IV. Pt acknowledged understanding of this information and continued to demand IV be removed. IV then removed by this RN.

## 2021-11-01 ENCOUNTER — Other Ambulatory Visit: Payer: Self-pay | Admitting: Internal Medicine

## 2021-11-01 DIAGNOSIS — N400 Enlarged prostate without lower urinary tract symptoms: Secondary | ICD-10-CM

## 2021-11-04 ENCOUNTER — Telehealth: Payer: Self-pay

## 2021-11-04 ENCOUNTER — Other Ambulatory Visit: Payer: Self-pay

## 2021-11-04 DIAGNOSIS — Z86010 Personal history of colonic polyps: Secondary | ICD-10-CM

## 2021-11-04 DIAGNOSIS — Z7901 Long term (current) use of anticoagulants: Secondary | ICD-10-CM

## 2021-11-04 NOTE — Telephone Encounter (Signed)
Called and spoke to patient.  He was driving out of town and could not check his calendar. He asked that I call him back in 2 weeks regarding August procedure. I went ahead and Scheduled patient for 8-29 and will mail a letter to him to check that date and call us back to confirm. He is on Xarelto and will need clearance to hold for 2 days prior to procedure.

## 2021-11-04 NOTE — Progress Notes (Signed)
Colonoscopy at [facility] on Tuesday, 8-29

## 2021-11-08 DIAGNOSIS — G4733 Obstructive sleep apnea (adult) (pediatric): Secondary | ICD-10-CM | POA: Diagnosis not present

## 2021-11-09 ENCOUNTER — Other Ambulatory Visit: Payer: Self-pay | Admitting: Internal Medicine

## 2021-11-09 DIAGNOSIS — J449 Chronic obstructive pulmonary disease, unspecified: Secondary | ICD-10-CM

## 2021-11-17 ENCOUNTER — Other Ambulatory Visit: Payer: Self-pay | Admitting: Cardiovascular Disease

## 2021-11-17 MED ORDER — SACUBITRIL-VALSARTAN 49-51 MG PO TABS: 1.0000 | ORAL_TABLET | Freq: Two times a day (BID) | ORAL | 0 refills | Status: DC

## 2021-11-18 ENCOUNTER — Other Ambulatory Visit (HOSPITAL_COMMUNITY): Payer: Self-pay

## 2021-11-18 ENCOUNTER — Telehealth: Payer: Self-pay

## 2021-11-18 NOTE — Telephone Encounter (Signed)
Patient Advocate Encounter   Received notification from [facility] that prior authorization is required for Entecavir 0.'5MG'$   Test billing for this medication shows that it does NOT require authorization, but does have a 34 day supply limit from the insurance and was recently successfully billed by [place].  No prior authorization submitted at this time.  Clista Bernhardt, CPhT Pharmacy Patient Advocate Specialist [place] Patient Advocate Team Phone: [phone]   Fax: [phone]

## 2021-11-26 ENCOUNTER — Telehealth: Payer: Self-pay | Admitting: Gastroenterology

## 2021-11-26 ENCOUNTER — Other Ambulatory Visit: Payer: Self-pay | Admitting: Cardiovascular Disease

## 2021-11-26 ENCOUNTER — Telehealth: Payer: Self-pay | Admitting: Cardiovascular Disease

## 2021-11-26 MED ORDER — ENTECAVIR 0.5 MG PO TABS: 0.5000 mg | ORAL_TABLET | ORAL | 1 refills | Status: DC

## 2021-11-26 NOTE — Telephone Encounter (Signed)
Patient called stating he went on vacation to [place] and while there he had a stroke and was taken to [facility].  Patient stated he is scheduled for a cardiac ablation but would like to check if he should delay this procedure because he had the stroke.  Patient noted he had accidentally taken two doses of his morning medication the day this incident occured.

## 2021-11-26 NOTE — Telephone Encounter (Signed)
Inbound call from patient stating he needs a refill for BARACLUDE. Please advise.

## 2021-11-26 NOTE — Telephone Encounter (Signed)
Refill sent to pharmacy on file

## 2021-11-26 NOTE — Telephone Encounter (Signed)
Returned call to patient to inform him that Dr Curt Bears and RN are out of office today. His ablation is scheduled for 12/08/21 which I do not believe needs to be postponed, but will route to [place] for final review. The stroke occurred while he was going to a 40th high school reunion. He states he got up early that morning and ate breakfast and took AM meds, then several hours later when time to leave, he ate another snack and took meds again. Advised him that nothing (medication duplication) should have evoked the CVA. He states friends/family took him to nearby fire station to get BP checked when they noticed he was not acting himself, and he fell out of the truck and was unable to speak clearly once there. Hospital encounter in [place]. No deficits from CVA. Wants reassurance that the ablation is still okay to do and (I believe) still necessary.

## 2021-11-27 IMAGING — CT CT CHEST W/O CM
2 of 3 series · 15 of 36 positions shown, 18 images · non-contrast
Comparison: October 02, 2020.

CLINICAL DATA: Chronic dyspnea.

EXAM:
CT CHEST WITHOUT CONTRAST
TECHNIQUE: Multidetector CT imaging of the chest was performed following the
standard protocol without IV contrast.

[Series 3: chest w/o 2mm st · axial · non-contrast · 0.98mm/px · z∈[+1206,+1542]mm · 12 of 198 slices shown, 15 images]
[im 15/198  mediastinal]
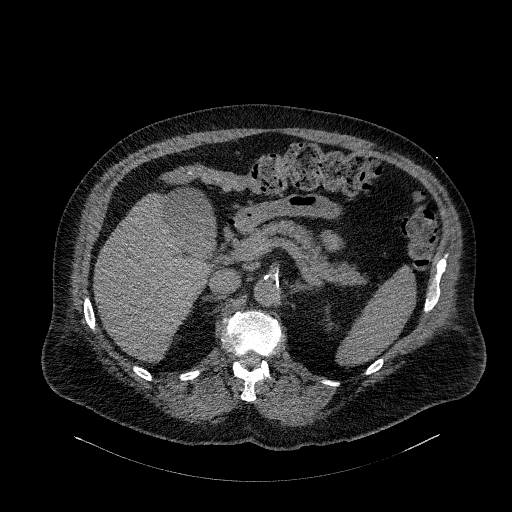
[im 15/198  lung]
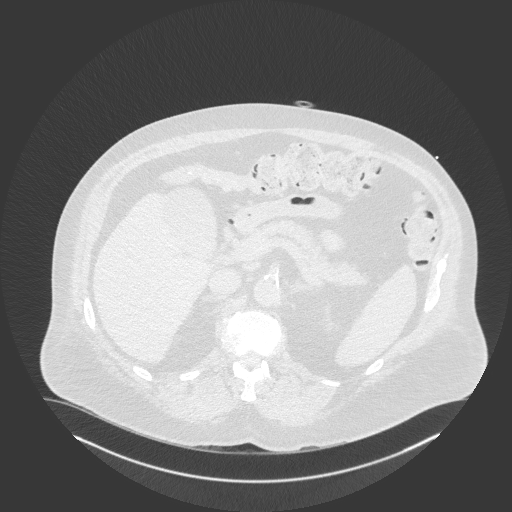
[im 30/198  lung]
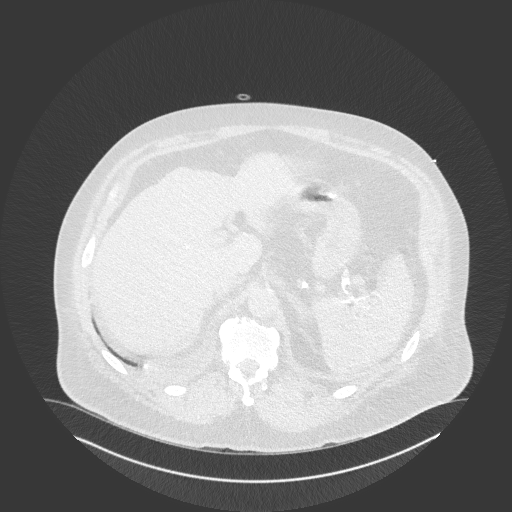
[im 44/198  lung]
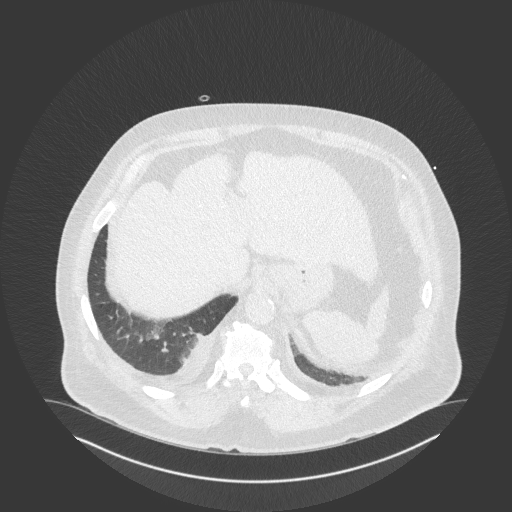
[im 59/198  lung]
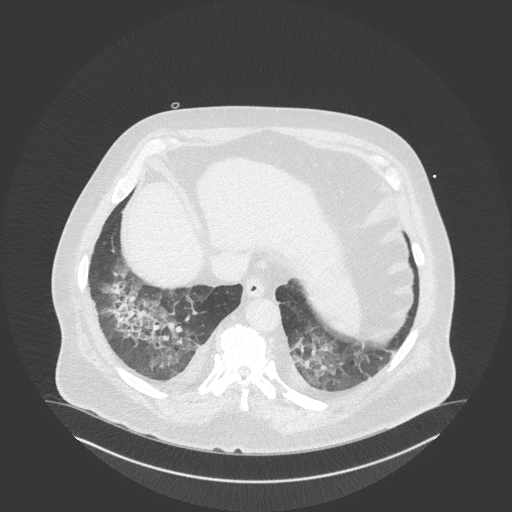
[im 73/198  mediastinal]
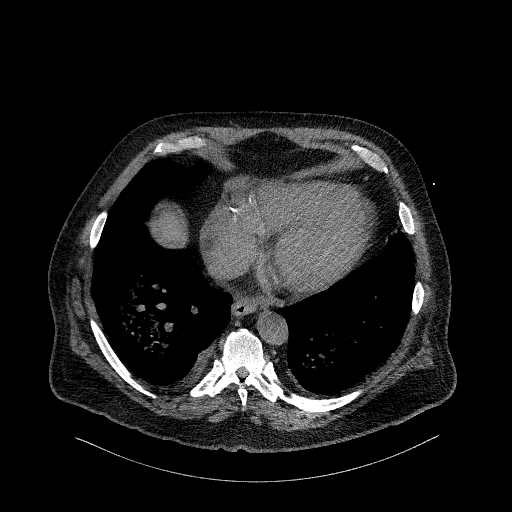
[im 73/198  lung]
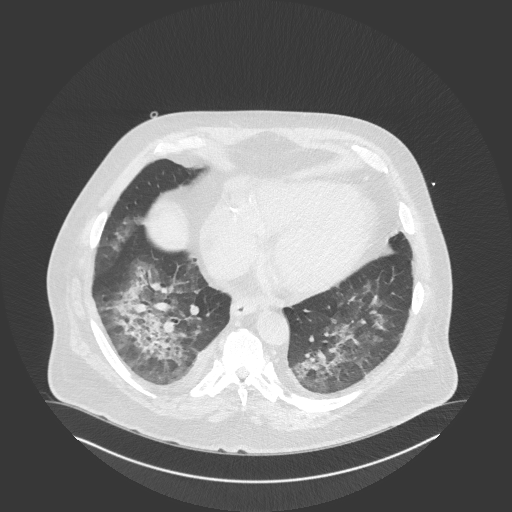
[im 88/198  lung]
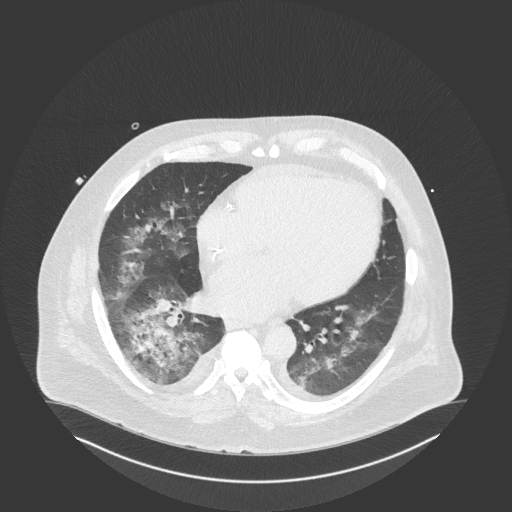
[im 110/198  lung]
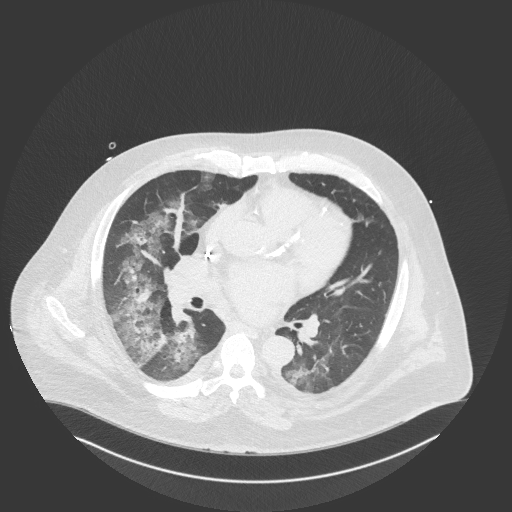
[im 125/198  lung]
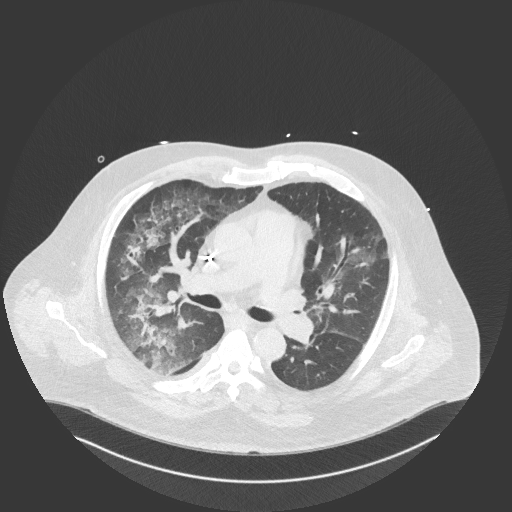
[im 139/198  mediastinal]
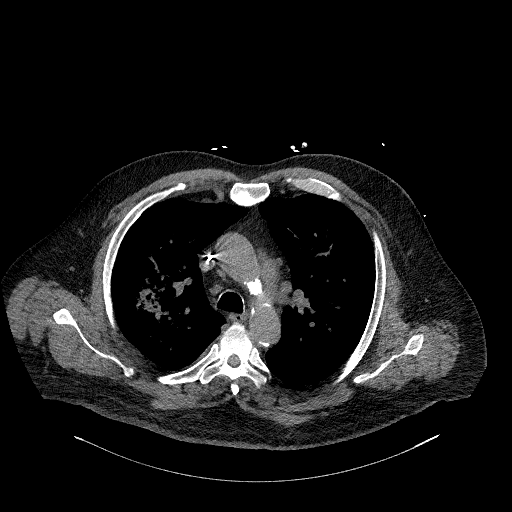
[im 139/198  lung]
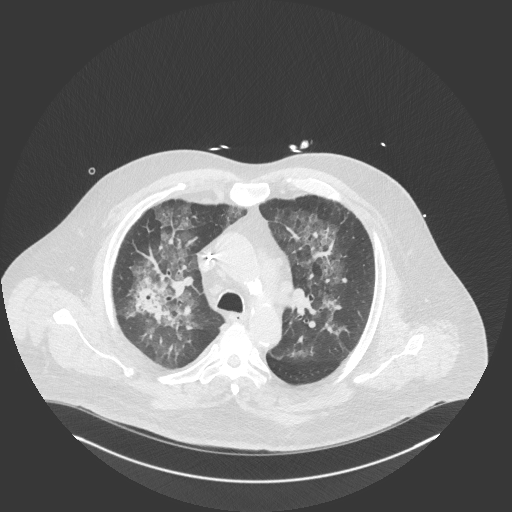
[im 154/198  lung]
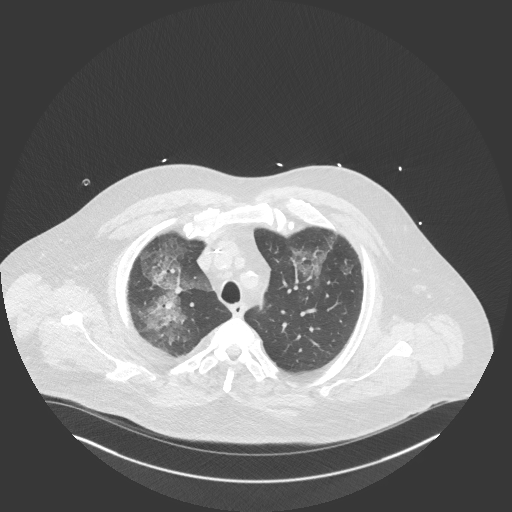
[im 168/198  lung]
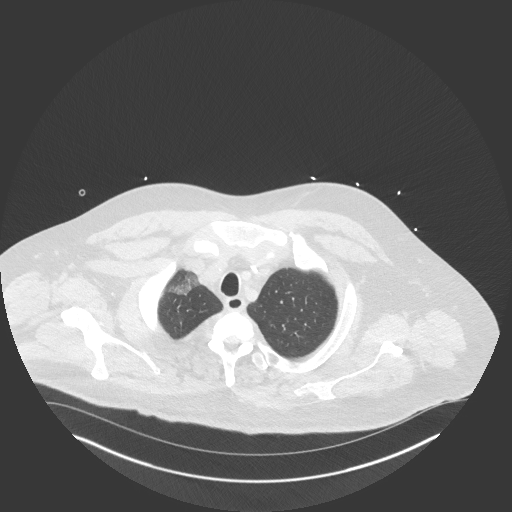
[im 183/198  lung]
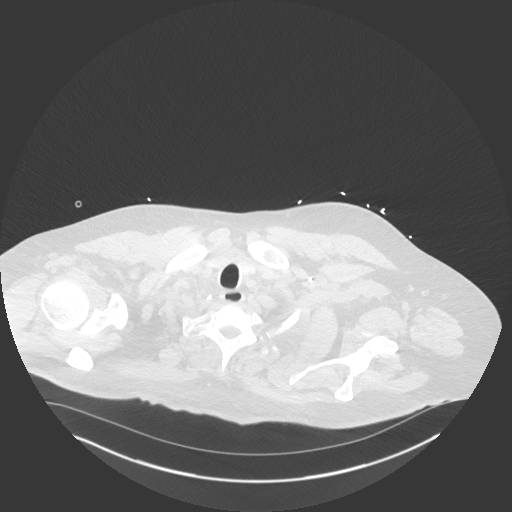

[Series 6: chest w/o 2mm st cor · coronal · non-contrast · 0.77mm/px · 3 of 167 slices shown]
[im 34/167  lung]
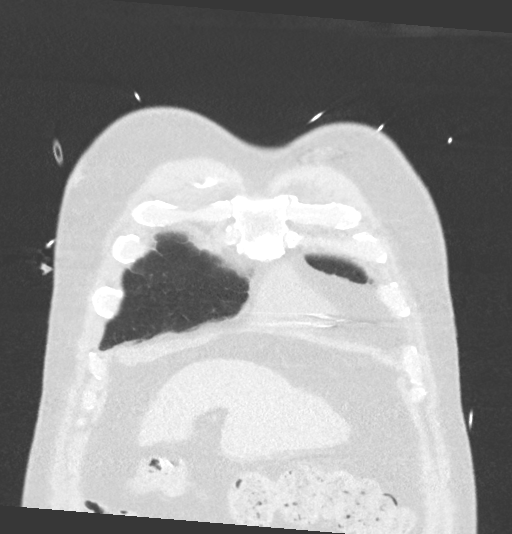
[im 67/167  lung]
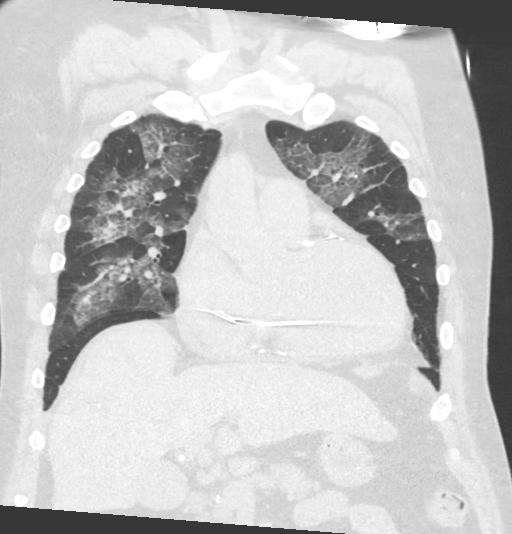
[im 100/167  lung]
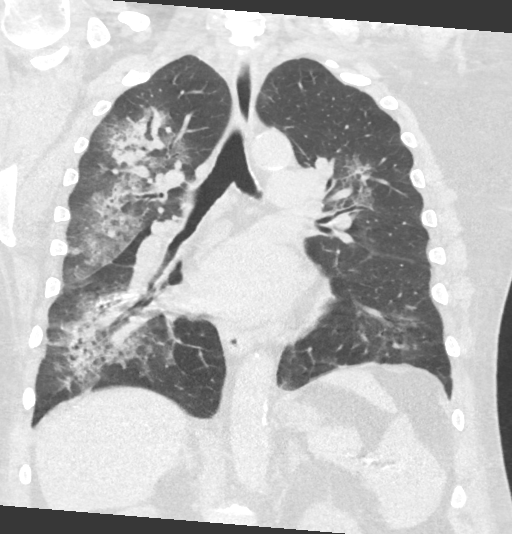

[15 of 36 positions shown; findings below may reference images not displayed]

FINDINGS: Cardiovascular: Atherosclerosis of thoracic aorta is noted without
aneurysm formation. Normal cardiac size. No pericardial effusion.
Coronary artery calcifications are noted.

Mediastinum/Nodes: No enlarged mediastinal or axillary lymph nodes.
Thyroid gland, trachea, and esophagus demonstrate no significant
findings.

Lungs/Pleura: No pneumothorax is noted. Minimal right pleural
effusion is noted. Patchy airspace opacities are noted bilaterally
concerning for multifocal pneumonia.

Upper Abdomen: Nodular hepatic contours are noted suggesting hepatic
cirrhosis.

Musculoskeletal: No chest wall mass or suspicious bone lesions
identified.
IMPRESSION: Multiple patchy airspace opacities are noted bilaterally concerning
for multifocal pneumonia.

Coronary artery calcifications are noted consistent with coronary
artery disease.

Findings consistent hepatic cirrhosis.

Aortic Atherosclerosis ([id number].A).

## 2021-11-29 ENCOUNTER — Telehealth: Payer: Self-pay

## 2021-11-29 ENCOUNTER — Ambulatory Visit (INDEPENDENT_AMBULATORY_CARE_PROVIDER_SITE_OTHER): Payer: Medicaid Other

## 2021-11-29 DIAGNOSIS — Z95810 Presence of automatic (implantable) cardiac defibrillator: Secondary | ICD-10-CM | POA: Diagnosis not present

## 2021-11-29 DIAGNOSIS — I5022 Chronic systolic (congestive) heart failure: Secondary | ICD-10-CM

## 2021-11-29 NOTE — Telephone Encounter (Signed)
Remote ICM transmission received.  Attempted call to patient regarding ICM remote transmission and left detailed message per DPR.  Advised to return call for any fluid symptoms or questions.  Next ICM remote transmission scheduled 12/09/2021.

## 2021-11-29 NOTE — Telephone Encounter (Signed)
Left message to call back  

## 2021-11-29 NOTE — Progress Notes (Signed)
EPIC Encounter for ICM Monitoring  Patient Name: Cody Ortega is a 61 y.o. male Date: 11/29/2021 Primary Care Physican: Ladell Pier, MD Primary Cardiologist: Burt Knack Electrophysiologist: Lovena Le 08/05/2021 Office Weight: 274 lbs                                                             Attempted call to patient and unable to reach.  Left detailed message per DPR regarding transmission. Transmission reviewed.   ED visits 7/15 with dx of stroke but no deficits.  Scheduled for ablation 8/9.   Optivol thoracic impedance suggesting possible fluid accumulation starting 7/24.     Prescribed:  Furosemide 40 mg take 0.5 tablet (20 mg total) by mouth daily.   Labs: 11/14/2021 Creatinine 3.51, BUN 42, Potassium 3.7, Sodium 140, GFR 18 11/13/2021 Creatinine 3.73, BUN 41, Potassium 3.8, Sodium 137, GFR 17  10/29/2021 Creatinine 2.58, BUN 29, Potassium 4.2, Sodium 141, GFR 28  07/20/2021 Creatinine 2.33, BUN 33, Potassium 3.8, Sodium 137, GFR 30 A complete set of results can be found in Results Review.   Recommendations:  Left voice mail with ICM number and encouraged to call if experiencing any fluid symptoms.   Follow-up plan: ICM clinic phone appointment on 12/09/2021 to recheck fluid levels.   91 day device clinic remote transmission 01/14/2022.     EP/Cardiology Office Visits:  12/22/2021 with Dr Burt Knack. Recall 05/04/2022 with Tommye Standard, PA or Oda Kilts, [place].   Copy of ICM check sent to Dr. Lovena Le.  Will send copy to Dr Burt Knack for review if patient is reached.   3 month ICM trend: 11/29/2021.    12-14 Month ICM trend:     Rosalene Billings, RN 11/29/2021 4:19 PM

## 2021-11-30 DIAGNOSIS — J9601 Acute respiratory failure with hypoxia: Secondary | ICD-10-CM | POA: Diagnosis not present

## 2021-11-30 DIAGNOSIS — I5020 Unspecified systolic (congestive) heart failure: Secondary | ICD-10-CM | POA: Diagnosis not present

## 2021-11-30 DIAGNOSIS — J690 Pneumonitis due to inhalation of food and vomit: Secondary | ICD-10-CM | POA: Diagnosis not present

## 2021-11-30 NOTE — Telephone Encounter (Signed)
Left message to call back   (Left detailed message informing pt that need to cancel procedure planned for next week until recent hospital records can be reviewed)

## 2021-12-01 ENCOUNTER — Other Ambulatory Visit: Payer: Medicaid Other

## 2021-12-01 ENCOUNTER — Telehealth: Payer: Self-pay | Admitting: Cardiovascular Disease

## 2021-12-01 DIAGNOSIS — I4819 Other persistent atrial fibrillation: Secondary | ICD-10-CM | POA: Diagnosis not present

## 2021-12-01 DIAGNOSIS — Z01812 Encounter for preprocedural laboratory examination: Secondary | ICD-10-CM

## 2021-12-01 LAB — CBC
Hematocrit: 46.8 % (ref 37.5–51.0)
Hemoglobin: 15.8 g/dL (ref 13.0–17.7)
MCH: 31.2 pg (ref 26.6–33.0)
MCHC: 33.8 g/dL (ref 31.5–35.7)
MCV: 93 fL (ref 79–97)
Platelets: 197 10*3/uL (ref 150–450)
RBC: 5.06 x10E6/uL (ref 4.14–5.80)
RDW: 14.7 % (ref 11.6–15.4)
WBC: 7.8 10*3/uL (ref 3.4–10.8)

## 2021-12-01 LAB — BASIC METABOLIC PANEL
BUN/Creatinine Ratio: 13 (ref 9–20)
BUN: 31 mg/dL — ABNORMAL HIGH (ref 6–24)
CO2: 30 mmol/L — ABNORMAL HIGH (ref 20–29)
Calcium: 9.1 mg/dL (ref 8.7–10.2)
Chloride: 103 mmol/L (ref 96–106)
Creatinine, Ser: 2.45 mg/dL — ABNORMAL HIGH (ref 0.76–1.27)
Glucose: 115 mg/dL — ABNORMAL HIGH (ref 70–99)
Potassium: 4.3 mmol/L (ref 3.5–5.2)
Sodium: 139 mmol/L (ref 134–144)
eGFR: 30 mL/min/{1.73_m2} — ABNORMAL LOW (ref >59)

## 2021-12-01 NOTE — Telephone Encounter (Signed)
Pt came in for lab work but wanted Dr.Cooper to look over his ER notes from [place].  Pt stated while on vacation he had an episode and had to be rushed to the ER. The ER doctor was trying to keep him overnight pt stated he'd prefer to get back to [place] to see his doctor instead, pt was concerned about his upcoming ablation also  Thank You  Please Advise the best number to reach pt  [phone]

## 2021-12-02 ENCOUNTER — Other Ambulatory Visit: Payer: Self-pay

## 2021-12-02 NOTE — Telephone Encounter (Signed)
[facility] is requesting a refill on sildenafil 20 mg tablet. Would Dr. Burt Knack like to refill this medication? Please address

## 2021-12-02 NOTE — Telephone Encounter (Signed)
Left message to call back  

## 2021-12-03 MED ORDER — SILDENAFIL CITRATE 20 MG PO TABS: 20.0000 mg | ORAL_TABLET | ORAL | 0 refills | Status: DC | PRN

## 2021-12-03 NOTE — Telephone Encounter (Signed)
Left detailed message informing pt that ablation for next week will be cancelled. Aware I will follow up at a later date to discuss plan. Aware it will be several weeks as I am not in the office next week.

## 2021-12-03 NOTE — Telephone Encounter (Signed)
Informed Dr Burt Knack that pt wanted him to review ED notes that were in the chart. Per note in chart by [facility]' nurse :   Stanton Kidney, RN  Registered Nurse Telephone Encounter Signed Encounter Date:  11/26/2021   Signed                       Left message to call back     (Left detailed message informing pt that need to cancel procedure planned for next week until recent hospital records can be reviewed)        Electronically signed by Stanton Kidney, RN at 11/30/2021 12:39 PM    Patient states that he is not comfortable doing the ablation procedure until Dr Burt Knack reviews his chart and/or sees him to ensure that it's okay and recommended. States "I'm very nervous about this." Questioned pt about above call and he did not know that/if procedure was cancelled. Will route note to [facility] for review and follow-up (procedure still currently listed for 12/08/21 in chart). Pt asks that we call him back with response ASAP and to leave a detailed message if he doesn't pick up. Verified number in chart is correct.

## 2021-12-07 ENCOUNTER — Telehealth: Payer: Self-pay | Admitting: Cardiovascular Disease

## 2021-12-07 NOTE — Telephone Encounter (Signed)
Returned call to patient, confirmed ablation has been cancelled. Patient will follow-up with Dr. Burt Knack on 12/22/21.  Patient expressed appreciation for call.

## 2021-12-07 NOTE — Telephone Encounter (Signed)
He has an upcoming appt with me I will review everything with him at that time. thx

## 2021-12-07 NOTE — Telephone Encounter (Signed)
Calling to make sure the ablastin was cancel . Please advise

## 2021-12-08 ENCOUNTER — Encounter (HOSPITAL_COMMUNITY): Admission: RE | Payer: Medicaid Other | Source: Home / Self Care

## 2021-12-08 ENCOUNTER — Ambulatory Visit (HOSPITAL_COMMUNITY): Admission: RE | Admit: 2021-12-08 | Payer: Medicaid Other | Source: Home / Self Care | Admitting: Cardiology

## 2021-12-08 SURGERY — ATRIAL FIBRILLATION ABLATION
Anesthesia: General

## 2021-12-09 ENCOUNTER — Other Ambulatory Visit: Payer: Self-pay

## 2021-12-09 MED ORDER — AMIODARONE HCL 200 MG PO TABS: 200.0000 mg | ORAL_TABLET | Freq: Every day | ORAL | 2 refills | Status: DC

## 2021-12-10 NOTE — Progress Notes (Signed)
No ICM remote transmission received for 12/10/2021 and next ICM transmission scheduled for 01/04/2022.

## 2021-12-11 ENCOUNTER — Other Ambulatory Visit: Payer: Self-pay | Admitting: Internal Medicine

## 2021-12-11 DIAGNOSIS — N400 Enlarged prostate without lower urinary tract symptoms: Secondary | ICD-10-CM

## 2021-12-13 ENCOUNTER — Telehealth: Payer: Self-pay

## 2021-12-13 NOTE — Telephone Encounter (Signed)
Message left for pt on voicemail that current [facility] with refill [facility]

## 2021-12-13 NOTE — Telephone Encounter (Signed)
Pt has a rx for Flomax with a refill. Will notify pt it is at this [facility] on [address]. And make sure that is where he wants it.

## 2021-12-13 NOTE — Telephone Encounter (Signed)
-----   Message from Roetta Sessions, [place] sent at 12/13/2021  9:03 AM EDT ----- Regarding: FW: cancel colonoscopy 8-29 Hello.  Dottie took patient off hosp schedule for 8-29 due to CVA  last month.  I placed a recall OV for October and updated the hospital list. Can you just confirm with the patient that we have cancelled his procedure (which he never actually confirmed officially :-/) and see if he wants to go ahead and schedule an OV in October with [facility]?   Thank you!   ----- Message ----- From: Yetta Flock, MD Sent: 12/13/2021   7:38 AM EDT To: Roetta Sessions, CMA Subject: RE: cancel colonoscopy 8-29                    Thanks Jan, Sorry to see this. Yes appears he was admitted for a CVA a few weeks ago. His case will need to be delayed by at least 3 months given this finding and see me again in the office in a few months for reassessment. He should be taken off the schedule for that day, hopefully we have someone who can fill that open slot. Thanks for seeing this   ----- Message ----- From: Roetta Sessions, CMA Sent: 12/13/2021  12:00 AM EDT To: Yetta Flock, MD Subject: cancel colonoscopy 8-29                        Dr. Loni Muse - Please review chart. It appears patient has had a stroke and is under the care of cardiology. He is scheduled for colonoscopy at [facility] on 8-29. I will cancel once you confirm.  Thank you, Jan

## 2021-12-13 NOTE — Telephone Encounter (Signed)
Lm on vm for patient to return call 

## 2021-12-13 NOTE — Telephone Encounter (Signed)
Pt has rx at different [facility]. Requested Prescriptions  Pending Prescriptions Disp Refills  . tamsulosin (FLOMAX) 0.4 MG CAPS capsule [Pharmacy Med Name: TAMSULOSIN 0.'4MG'$  CAPSULES] 30 capsule     Sig: TAKE 1 CAPSULE(0.4 MG) BY MOUTH DAILY     Urology: Alpha-Adrenergic Blocker Failed - 12/11/2021 10:15 AM      Failed - PSA in normal range and within 360 days    Prostate Specific Ag, Serum  Date Value Ref Range Status  01/02/2019 3.3 0.0 - 4.0 ng/mL Final    Comment:    Roche ECLIA methodology. According to the American Urological Association, Serum PSA should decrease and remain at undetectable levels after radical prostatectomy. The AUA defines biochemical recurrence as an initial PSA value 0.2 ng/mL or greater followed by a subsequent confirmatory PSA value 0.2 ng/mL or greater. Values obtained with different assay methods or kits cannot be used interchangeably. Results cannot be interpreted as absolute evidence of the presence or absence of malignant disease.          Failed - Last BP in normal range    BP Readings from Last 1 Encounters:  10/30/21 (!) 185/98         Passed - Valid encounter within last 12 months    Recent Outpatient Visits          4 months ago COPD with asthma ([place])   [place], MD   6 months ago OSA on CPAP   [place], [place]   8 months ago Essential hypertension   [place], [place]   1 year ago OSA (obstructive sleep apnea)   [place], Deborah B, MD   1 year ago Essential hypertension   [place], [place]      Future Appointments            In 1 week Ladell Pier, MD [place]   In 1 week [name], MD [place], [facility]   In 2 months [place],  Ocie Doyne, MD [place], [facility]

## 2021-12-14 ENCOUNTER — Other Ambulatory Visit: Payer: Self-pay | Admitting: Cardiovascular Disease

## 2021-12-14 ENCOUNTER — Other Ambulatory Visit: Payer: Self-pay

## 2021-12-15 NOTE — Telephone Encounter (Signed)
Lm on vm for patient to return call 

## 2021-12-17 NOTE — Telephone Encounter (Signed)
3rd attempt to reach patient  I left patient a detailed vm letting him know that we have cancelled his colonoscopy at [facility] on 12/28/21 due to his recent stroke. I informed patient that Dr. Havery Moros would like to see him in the office in 3 months for reassessment. I advised pt to call back to schedule appt or if he had any questions or concerns.   5-monthoffice recall in epic.

## 2021-12-20 ENCOUNTER — Telehealth: Payer: Self-pay | Admitting: *Deleted

## 2021-12-20 ENCOUNTER — Ambulatory Visit: Payer: Medicaid Other | Admitting: Internal Medicine

## 2021-12-20 NOTE — Telephone Encounter (Signed)
Left message to call back to reschedule ablation

## 2021-12-22 ENCOUNTER — Ambulatory Visit (INDEPENDENT_AMBULATORY_CARE_PROVIDER_SITE_OTHER): Payer: Medicaid Other | Admitting: Cardiovascular Disease

## 2021-12-22 ENCOUNTER — Encounter: Payer: Self-pay | Admitting: Cardiovascular Disease

## 2021-12-22 VITALS — BP 128/80 | HR 51 | Ht 75.0 in | Wt 270.2 lb

## 2021-12-22 DIAGNOSIS — I4819 Other persistent atrial fibrillation: Secondary | ICD-10-CM | POA: Diagnosis not present

## 2021-12-22 DIAGNOSIS — N184 Chronic kidney disease, stage 4 (severe): Secondary | ICD-10-CM

## 2021-12-22 DIAGNOSIS — I2510 Atherosclerotic heart disease of native coronary artery without angina pectoris: Secondary | ICD-10-CM

## 2021-12-22 DIAGNOSIS — I5022 Chronic systolic (congestive) heart failure: Secondary | ICD-10-CM

## 2021-12-22 NOTE — Progress Notes (Signed)
Cardiology Office Note:    Date:  12/22/2021   ID:  Cody Ortega, DOB May 11, 1960, MRN [id number]  PCP:  Ladell Pier, MD   [place] Providers Cardiologist:  [name], MD Electrophysiologist:  Cristopher Peru, MD     Referring MD: Ladell Pier, MD   Chief Complaint  Patient presents with   Atrial Fibrillation    History of Present Illness:    Cody Ortega is a 61 y.o. 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 Havery Moros), anxiety, arthritis, asthma, paroxysmal atrial fibrillation requiring prior TEE/DCCV treated with amiodarone, diverticulosis, HTN, stroke, sleep apnea (intolerant of CPAP, uses nose strips), CKD stage IV (followed by Dr. Candiss Norse) and thyroid disease.  The patient with the above problem list is here for cardiology follow-up today.  Last month he was in [place] for a high school reunion.  He took all of his medications early in the morning and then went back to sleep.  He took all of the medicines again an hour later when he woke up, having forgotten that he had just taken them.  He began to feel very poorly with weakness and dizziness and he was taken to the hospital.  He was ultimately diagnosed with a small stroke but never had any focal deficits.  He did not require any specific intervention.  I reviewed all of the studies available in care everywhere and have outlined them below.  An echocardiogram showed an LVEF of 30 to 35% which is stable for this patient.  There is no LV thrombus noted.  A carotid ultrasound showed carotid atherosclerosis with no high-grade carotid stenosis.  Multiple brain CTs were done and 1 showed a small area of low-density in the right parietal occipital lobe suggesting an acute cortical  infarction.  A follow-up CT later that same day showed no acute hemorrhage or edema.  There were old strokes demonstrated in the posterior cerebral hemispheres bilaterally and old lacunar strokes in the right caudate nucleus and right central corona radiata.  The patient has returned home and has recovered well.  He still has no neurologic deficits and feels like he is back to his normal state of health.  He has not been taking his medications as regularly because his sleep cycles have been interrupted and irregular.  He has not had any recent chest pain or pressure.  He has mild shortness of breath with exertion which is unchanged over time.  No edema, orthopnea, or PND.  He has had some morning cough but otherwise no new complaints.  Past Medical History:  Diagnosis Date   Anxiety    Arthritis    Benzodiazepine dependence (HCC)    Bronchial asthma    CAD (coronary artery disease)    a. Cath 03/2010: mod RCA stenosis, severe diag stenosis, treated medically given lack of angina.   Cardiomyopathy    possible cocaine induced   Chronic systolic congestive heart failure (HCC)    Cirrhosis (HCC)    secondary to hepatitis B   CKD (chronic kidney disease), stage IV (HCC)    Stage 3-4   Depression    Diverticulosis    Hepatitis B    History of cocaine abuse ([place])    Hyperlipidemia LDL goal <70    Hypermetropia of both eyes not  needing correction 09/29/2020   Hypertension    Hypertensive retinopathy    Microcytic anemia    Obesity    Persistent atrial fibrillation ([place])    a. Episode in 2008 with spontaneous conversion, on amiodarone per EP.   S/P implantation of automatic cardioverter/defibrillator (AICD)    a. Medtronic, implanted 2012.   Sleep apnea    Stroke [facility]) 2004   Thyroid disease     Past Surgical History:  Procedure Laterality Date   CARDIOVERSION N/A 01/07/2014   Procedure: CARDIOVERSION;  Surgeon: Lelon Perla, MD;  Location: [facility] ENDOSCOPY;  Service: Cardiovascular;   Laterality: N/A;   CARDIOVERSION N/A 02/11/2014   Procedure: CARDIOVERSION;  Surgeon: Pixie Casino, MD;  Location: [place];  Service: Cardiovascular;  Laterality: N/A;   CARDIOVERSION N/A 10/01/2020   Procedure: CARDIOVERSION;  Surgeon: Werner Lean, MD;  Location: [place] ENDOSCOPY;  Service: Cardiovascular;  Laterality: N/A;   CATARACT EXTRACTION     COLONOSCOPY WITH PROPOFOL N/A 02/06/2018   Procedure: COLONOSCOPY WITH PROPOFOL;  Surgeon: Yetta Flock, MD;  Location: [facility] ENDOSCOPY;  Service: Gastroenterology;  Laterality: N/A;   EYE SURGERY     ICD GENERATOR CHANGEOUT N/A 01/17/2020   Procedure: ICD GENERATOR CHANGEOUT;  Surgeon: Evans Lance, MD;  Location: [place] CV LAB;  Service: Cardiovascular;  Laterality: N/A;   LASEK eye surgery Bilateral 05/2018   LASIK Bilateral    PACEMAKER INSERTION     POLYPECTOMY  02/06/2018   Procedure: POLYPECTOMY;  Surgeon: Yetta Flock, MD;  Location: [facility] ENDOSCOPY;  Service: Gastroenterology;;   TEE WITHOUT CARDIOVERSION N/A 01/07/2014   Procedure: TRANSESOPHAGEAL ECHOCARDIOGRAM (TEE);  Surgeon: Lelon Perla, MD;  Location: [facility] ENDOSCOPY;  Service: Cardiovascular;  Laterality: N/A;   TEE WITHOUT CARDIOVERSION N/A 02/11/2014   Procedure: TRANSESOPHAGEAL ECHOCARDIOGRAM (TEE);  Surgeon: Pixie Casino, MD;  Location: [facility] ENDOSCOPY;  Service: Cardiovascular;  Laterality: N/A;   TEE WITHOUT CARDIOVERSION N/A 10/01/2020   Procedure: TRANSESOPHAGEAL ECHOCARDIOGRAM (TEE);  Surgeon: Werner Lean, MD;  Location: [facility] ENDOSCOPY;  Service: Cardiovascular;  Laterality: N/A;   TONSILLECTOMY     TRANSTHORACIC ECHOCARDIOGRAM  10/2006, 12/2009    Current Medications: Current Meds  Medication Sig   acetaminophen (TYLENOL) 500 MG tablet Take 1,000 mg by mouth every 6 (six) hours as needed for moderate pain or headache.   albuterol (PROVENTIL) (2.5 MG/3ML) 0.083% nebulizer solution Take 3 mLs (2.5 mg total) by  nebulization every 6 (six) hours as needed for wheezing or shortness of breath.   albuterol (VENTOLIN HFA) 108 (90 Base) MCG/ACT inhaler Inhale 2 puffs into the lungs every 6 (six) hours as needed for wheezing or shortness of breath.   alprazolam (XANAX) 2 MG tablet Take 2 mg by mouth every 6 (six) hours as needed for anxiety.   amiodarone (PACERONE) 200 MG tablet Take 1 tablet (200 mg total) by mouth daily.   atorvastatin (LIPITOR) 10 MG tablet TAKE 1 TABLET(10 MG) BY MOUTH DAILY   carvedilol (COREG) 12.5 MG tablet Take 1.5 tabs PO twice a day PO   dapagliflozin propanediol (FARXIGA) 10 MG TABS tablet Take 1 tablet (10 mg total) by mouth daily before breakfast.   DULoxetine (CYMBALTA) 30 MG capsule Take 90 mg by mouth daily.    entecavir (BARACLUDE) 0.5 MG tablet Take 1 tablet (0.5 mg total) by mouth every other day.   ezetimibe (ZETIA) 10 MG tablet TAKE 1 TABLET(10 MG) BY MOUTH DAILY   ferrous sulfate 325 (65 FE) MG tablet Take  325 mg by mouth daily with breakfast.   fluticasone (FLONASE) 50 MCG/ACT nasal spray SHAKE LIQUID AND USE 1 SPRAY IN EACH NOSTRIL DAILY   furosemide (LASIX) 40 MG tablet Take 0.5 tablets (20 mg total) by mouth daily.   hydrALAZINE (APRESOLINE) 100 MG tablet Take 1 tablet (100 mg total) by mouth in the morning and at bedtime. (Patient taking differently: Take 100 mg by mouth 3 (three) times daily.)   ondansetron (ZOFRAN-ODT) 8 MG disintegrating tablet Take 8 mg by mouth 2 (two) times daily as needed for nausea or vomiting.   oxybutynin (DITROPAN) 5 MG tablet TAKE 1 TABLET(5 MG) BY MOUTH THREE TIMES DAILY   polyethylene glycol (MIRALAX) 17 g packet Take as directed, Daily, titrate as needed   RESTASIS 0.05 % ophthalmic emulsion 1 drop 2 (two) times daily.   sacubitril-valsartan (ENTRESTO) 49-51 MG Take 1 tablet by mouth 2 (two) times daily.   sildenafil (REVATIO) 20 MG tablet Take 1 tablet (20 mg total) by mouth as needed.   SYMBICORT 160-4.5 MCG/ACT inhaler INHALE 2 PUFFS  INTO THE LUNGS TWICE DAILY   tamsulosin (FLOMAX) 0.4 MG CAPS capsule TAKE 1 CAPSULE(0.4 MG) BY MOUTH DAILY   XARELTO 20 MG TABS tablet TAKE 1 TABLET(20 MG) BY MOUTH DAILY WITH SUPPER     Allergies:   Prochlorperazine, Codeine, and Hydrocodone-acetaminophen   Social History   Socioeconomic History   Marital status: Divorced    Spouse name: Not on file   Number of children: 2   Years of education: Not on file   Highest education level: Not on file  Occupational History   Occupation: Unemployed    Employer: UNEMPLOYED    Comment: Writer in the past  Tobacco Use   Smoking status: Former    Packs/day: 0.50    Years: 0.50    Total pack years: 0.25    Types: Cigarettes    Quit date: 05/02/2010    Years since quitting: 11.6   Smokeless tobacco: Never   Tobacco comments:    Significant Second-hand and only 3 months himself  Vaping Use   Vaping Use: Never used  Substance and Sexual Activity   Alcohol use: Not Currently    Alcohol/week: 0.0 standard drinks of alcohol    Comment: rare   Drug use: No    Comment: Reported history of cocaine abuse off and on    Sexual activity: Not on file  Other Topics Concern   Not on file  Social History Narrative   Living with a son who is a 12 year old.  He is not     working, unemployed for 3 years.  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  Denied use of alcohol.  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       [facility]:   Originally from [place], [place]. He grew up in [place]. He moved to [place] in 1985. 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 No pets currently. No bird exposure. He did have mold in a previous home. No hot tub exposure.         Social Determinants of Health   Financial Resource Strain: Not on file  Food Insecurity: Not on file  Transportation Needs: Not on file  Physical Activity: Not on file  Stress: Not on file  Social  Connections: Not on file     Family History: The patient's family history  includes Breast cancer in his mother; Cerebral palsy in his daughter; Diabetes in his father; Multiple myeloma in his mother; Peripheral vascular disease in his father; Prostate cancer in his father. There is no history of Lung disease, Colon cancer, Stomach cancer, Esophageal cancer, or Colon polyps.  ROS:   Please see the history of present illness.    All other systems reviewed and are negative.  EKGs/Labs/Other Studies Reviewed:    The following studies were reviewed today: Carotid US: RIGHT CAROTID SYSTEM: There is extensive calcified plaque in the carotid  bulb extending to the proximal ICA and ECA.  Distal CCA systolic velocity is 40 cm/sec. ICA peak systolic velocity,  Proximal is 154 cm/sec, Mid is 26 cm/sec, Distal is 36 cm/sec.   ICA /CCA systolic ratio is 3.9.   Distal CCA diastolic velocity is 8 cm/sec. ICA peak diastolic velocity,  Proximal is 43 cm/sec, Mid is 10 cm/sec, Distal is 16 cm/sec.   Vertebral is antegrade.   The visualized portion of the right external carotid artery appears  unremarkable.   LEFT CAROTID SYSTEM: Intimal thickening and scattered calcified plaque  throughout the common carotid artery. There is calcified plaque in the  carotid bulb extending to the proximal ECA and proximal-mid ICA  Distal CCA systolic velocity is 86 cm/sec.  ICA peak systolic velocity,  Proximal is 104 cm/sec, Mid is 71 cm/sec, Distal is 78 cm/sec.   ICA/CCA systolic ratio is 1.2.   Distal CCA diastolic velocity is 21 cm/sec.  ICA peak diastolic velocity,  Proximal is 48 cm/sec, Mid is 16 cm/sec, Distal is 25 cm/sec.   Vertebral is antegrade. Increased radioactivity noted in the left vertebral   artery.   The visualized portion of the left external carotid artery appears  unremarkable  Echo 11/14/21:   Biplane Ejection Fraction 45 % visually ejection fraction is 30 to 35%  similar to  previous ejection fractions in 2020    Left Ventricle:  decreased ejection fraction. Cavity is moderately  dilated. There is moderate concentric hypertrophy. Unable to assess left  ventricular diastolic function due to arrhythmia.    Right Ventricle: Normal right ventricular function. The right ventricle  is mildly dilated.    Left Atrium Cavity is severely dilated. Patent foramen ovale not  present.    No significant valvular pathology.    There is insufficient tricuspid regurgitation for estimation of the  right ventricular systolic pressure.    Aorta: The sinuses of Valsalva is dilated.    Pacer / Catheter: in the right atrium and right ventricle.    Pericardium: Pericardium is normal. There is no pericardial effusion.    No obvious thrombus noted in the ventricular apex nevertheless his  heart function is decreased but similar to previous echo was in 2020    Note that the atrial appendage is not visualized   Renal US:  RIGHT KIDNEY: Measures 11.9 cm in length and 5.2 cm in height.  There is no   hydronephrosis or perinephric collection. There are multiple renal cysts  measuring up to 1 cm.   LEFT KIDNEY: Measures 11.2 cm in length and 4.3 cm in height.  There is no  hydronephrosis or perinephric collection. There are multiple renal cysts  measuring up to 1.2 cm.   BLADDER:  Submitted images of the urinary bladder are unremarkable.  Bilateral ureteral jets are demonstrated.   IMPRESSION :    NORMAL SIZED KIDNEYS. NO HYDRONEPHROSIS.  EKG:  EKG is not ordered today.  Recent Labs: 07/20/2021: ALT 8 12/01/2021: BUN 31; Creatinine, Ser 2.45; Hemoglobin 15.8; Platelets 197; Potassium 4.3; Sodium 139  Recent Lipid Panel    Component Value Date/Time   CHOL 196 05/08/2020 1332   TRIG 135 05/08/2020 1332   HDL 33 (L) 05/08/2020 1332   CHOLHDL 5.9 (H) 05/08/2020 1332   CHOLHDL 3.9 10/12/2015 1026   VLDL 27 10/12/2015 1026   LDLCALC 138 (H) 05/08/2020 1332   LDLDIRECT 102 (H)  09/15/2020 1635     Risk Assessment/Calculations:    CHA2DS2-VASc Score = 5   This indicates a 7.2% annual risk of stroke. The patient's score is based upon: CHF History: 1 HTN History: 1 Diabetes History: 0 Stroke History: 2 Vascular Disease History: 1 Age Score: 0 Gender Score: 0               Physical Exam:    VS:  BP 128/80   Pulse (!) 51   Ht $R'6\' 3"'xD$  (1.905 m)   Wt 270 lb 3.2 oz (122.6 kg)   SpO2 96%   BMI 33.77 kg/m     Wt Readings from Last 3 Encounters:  12/22/21 270 lb 3.2 oz (122.6 kg)  08/19/21 276 lb (125.2 kg)  08/19/21 267 lb (121.1 kg)     GEN:  Well nourished, well developed in no acute distress HEENT: Normal NECK: No JVD; No carotid bruits LYMPHATICS: No lymphadenopathy CARDIAC: RRR, no murmurs, rubs, gallops RESPIRATORY:  Clear to auscultation without rales, wheezing or rhonchi  ABDOMEN: Soft, non-tender, non-distended MUSCULOSKELETAL:  No edema; No deformity  SKIN: Warm and dry NEUROLOGIC:  Alert and oriented x 3 PSYCHIATRIC:  Normal affect   ASSESSMENT:    1. Persistent atrial fibrillation ([place])   2. Chronic systolic heart failure ([place])   3. Coronary artery disease involving native coronary artery of native heart without angina pectoris   4. CKD (chronic kidney disease), stage IV (HCC)    PLAN:    In order of problems listed above:  The patient is maintained in sinus rhythm on amiodarone.  He is anticoagulated with rivaroxaban.  While he has missed some of his medications, he reports very good compliance with rivaroxaban and states that he takes it every day without missing doses.  He was scheduled to have an atrial fibrillation ablation with a goal of getting him off of amiodarone considering his relatively young age.  This has been canceled because of his recent stroke presentation.  After reviewing all of the data from [place], and discussing his history at length.  He does not appear to have had a major stroke and he has no deficits  at all.  He is on appropriate medical therapy.  He is now out greater than 1 month from his event that occurred after taking 2 doses of all his morning medicines.  I think it is probably safe for him to proceed with A-fib ablation and I will communicate to Dr. Curt Bears to see if he agrees and is able to get him on the schedule. Clinically stable.  Emphasized the importance of taking his medications regularly.  He is treated with carvedilol, Entresto, and furosemide.  He has had hyperkalemia with spironolactone and no longer takes this.  He is also on [place]. Stable with no angina.  Continue current regimen.  Treated with atorvastatin.  No antiplatelet therapy in the setting of stable CAD, no angina, and chronic oral anticoagulation. Followed by nephrology, most recent labs reviewed.  Creatinine appears stable at 2.45 with an  estimated GFR of 30.           Medication Adjustments/Labs and Tests Ordered: Current medicines are reviewed at length with the patient today.  Concerns regarding medicines are outlined above.  No orders of the defined types were placed in this encounter.  No orders of the defined types were placed in this encounter.   Patient Instructions  Medication Instructions:  Your physician recommends that you continue on your current medications as directed. Please refer to the Current Medication list given to you today.  *If you need a refill on your cardiac medications before your next appointment, please call your pharmacy*   Lab Work: NONE If you have labs (blood work) drawn today and your tests are completely normal, you will receive your results only by: [place] (if you have MyChart) OR A paper copy in the mail If you have any lab test that is abnormal or we need to change your treatment, we will call you to review the results.   Testing/Procedures: NONE   Follow-Up: At [facility], you and your health needs are our priority.  As part of our  continuing mission to provide you with exceptional heart care, we have created designated Provider Care Teams.  These Care Teams include your primary Cardiologist (physician) and Advanced Practice Providers (APPs -  Physician Assistants and Nurse Practitioners) who all work together to provide you with the care you need, when you need it.  We recommend signing up for the patient portal called "MyChart".  Sign up information is provided on this After Visit Summary.  MyChart is used to connect with patients for Virtual Visits (Telemedicine).  Patients are able to view lab/test results, encounter notes, upcoming appointments, etc.  Non-urgent messages can be sent to your provider as well.   To learn more about what you can do with MyChart, go to [email or web address].    Your next appointment:   4 month(s)  The format for your next appointment:   In Person  Provider:   Nickolas Madrid, or [place]    Important Information About Sugar         Signed, [name], MD  12/22/2021 12:43 PM    [place]

## 2021-12-22 NOTE — Patient Instructions (Signed)
Medication Instructions:  Your physician recommends that you continue on your current medications as directed. Please refer to the Current Medication list given to you today.  *If you need a refill on your cardiac medications before your next appointment, please call your pharmacy*   Lab Work: NONE If you have labs (blood work) drawn today and your tests are completely normal, you will receive your results only by: [place] (if you have MyChart) OR A paper copy in the mail If you have any lab test that is abnormal or we need to change your treatment, we will call you to review the results.   Testing/Procedures: NONE   Follow-Up: At [facility], you and your health needs are our priority.  As part of our continuing mission to provide you with exceptional heart care, we have created designated Provider Care Teams.  These Care Teams include your primary Cardiologist (physician) and Advanced Practice Providers (APPs -  Physician Assistants and Nurse Practitioners) who all work together to provide you with the care you need, when you need it.  We recommend signing up for the patient portal called "MyChart".  Sign up information is provided on this After Visit Summary.  MyChart is used to connect with patients for Virtual Visits (Telemedicine).  Patients are able to view lab/test results, encounter notes, upcoming appointments, etc.  Non-urgent messages can be sent to your provider as well.   To learn more about what you can do with MyChart, go to [email or web address].    Your next appointment:   4 month(s)  The format for your next appointment:   In Person  Provider:   Nickolas Madrid, or [place]    Important Information About Sugar

## 2021-12-23 NOTE — Telephone Encounter (Signed)
Left message to call back  

## 2021-12-28 ENCOUNTER — Ambulatory Visit (HOSPITAL_COMMUNITY): Admit: 2021-12-28 | Payer: Medicaid Other | Admitting: Gastroenterology

## 2021-12-28 ENCOUNTER — Encounter (HOSPITAL_COMMUNITY): Payer: Self-pay

## 2021-12-28 SURGERY — COLONOSCOPY WITH PROPOFOL
Anesthesia: Monitor Anesthesia Care

## 2021-12-29 ENCOUNTER — Other Ambulatory Visit: Payer: Self-pay | Admitting: Cardiovascular Disease

## 2021-12-31 DIAGNOSIS — J690 Pneumonitis due to inhalation of food and vomit: Secondary | ICD-10-CM | POA: Diagnosis not present

## 2021-12-31 DIAGNOSIS — J9601 Acute respiratory failure with hypoxia: Secondary | ICD-10-CM | POA: Diagnosis not present

## 2021-12-31 DIAGNOSIS — I5020 Unspecified systolic (congestive) heart failure: Secondary | ICD-10-CM | POA: Diagnosis not present

## 2022-01-05 ENCOUNTER — Encounter (HOSPITAL_COMMUNITY): Payer: Self-pay | Admitting: Physician Assistant

## 2022-01-05 ENCOUNTER — Ambulatory Visit (HOSPITAL_COMMUNITY)
Admission: RE | Admit: 2022-01-05 | Discharge: 2022-01-05 | Disposition: A | Discharge status: Home / Self Care | Payer: Medicaid Other | Source: Ambulatory Visit | Attending: Physician Assistant | Admitting: Physician Assistant

## 2022-01-05 VITALS — BP 118/80 | HR 65 | Ht 75.0 in | Wt 266.4 lb

## 2022-01-05 DIAGNOSIS — F419 Anxiety disorder, unspecified: Secondary | ICD-10-CM | POA: Diagnosis not present

## 2022-01-05 DIAGNOSIS — E669 Obesity, unspecified: Secondary | ICD-10-CM | POA: Insufficient documentation

## 2022-01-05 DIAGNOSIS — D6869 Other thrombophilia: Secondary | ICD-10-CM | POA: Diagnosis not present

## 2022-01-05 DIAGNOSIS — Z95810 Presence of automatic (implantable) cardiac defibrillator: Secondary | ICD-10-CM | POA: Insufficient documentation

## 2022-01-05 DIAGNOSIS — G4733 Obstructive sleep apnea (adult) (pediatric): Secondary | ICD-10-CM | POA: Insufficient documentation

## 2022-01-05 DIAGNOSIS — Z7901 Long term (current) use of anticoagulants: Secondary | ICD-10-CM | POA: Diagnosis not present

## 2022-01-05 DIAGNOSIS — N184 Chronic kidney disease, stage 4 (severe): Secondary | ICD-10-CM | POA: Diagnosis not present

## 2022-01-05 DIAGNOSIS — I4819 Other persistent atrial fibrillation: Secondary | ICD-10-CM | POA: Diagnosis not present

## 2022-01-05 DIAGNOSIS — B1910 Unspecified viral hepatitis B without hepatic coma: Secondary | ICD-10-CM | POA: Diagnosis not present

## 2022-01-05 DIAGNOSIS — I429 Cardiomyopathy, unspecified: Secondary | ICD-10-CM | POA: Insufficient documentation

## 2022-01-05 DIAGNOSIS — Z8673 Personal history of transient ischemic attack (TIA), and cerebral infarction without residual deficits: Secondary | ICD-10-CM | POA: Insufficient documentation

## 2022-01-05 DIAGNOSIS — I2510 Atherosclerotic heart disease of native coronary artery without angina pectoris: Secondary | ICD-10-CM | POA: Insufficient documentation

## 2022-01-05 DIAGNOSIS — I5022 Chronic systolic (congestive) heart failure: Secondary | ICD-10-CM | POA: Insufficient documentation

## 2022-01-05 DIAGNOSIS — I130 Hypertensive heart and chronic kidney disease with heart failure and stage 1 through stage 4 chronic kidney disease, or unspecified chronic kidney disease: Secondary | ICD-10-CM | POA: Insufficient documentation

## 2022-01-05 DIAGNOSIS — Z6833 Body mass index (BMI) 33.0-33.9, adult: Secondary | ICD-10-CM | POA: Insufficient documentation

## 2022-01-05 DIAGNOSIS — K7469 Other cirrhosis of liver: Secondary | ICD-10-CM | POA: Diagnosis not present

## 2022-01-05 NOTE — Progress Notes (Signed)
Primary Care Physician: Ladell Pier, MD Primary Cardiologist: Dr Burt Knack Primary Electrophysiologist: Dr Lovena Le Referring Physician: Melina Copa   Cody Ortega is a 61 y.o. 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 Havery Moros), anxiety, arthritis, asthma, atrial fibrillation, diverticulosis, HTN, stroke, sleep apnea (intolerant of CPAP, uses nose strips), CKD stage IV (followed by Dr. Candiss Norse), atrial fibrillation who presents for follow up in the [facility]. Patient is on Xarelto for a CHADS2VASC score of 5. He was in afib on 09/15/20 and set up for TEE/DCCV and his amiodarone was increased. He underwent successful DCCV on 10/01/20. He presented to the ED the next day with symptoms of severe SOB. CXR showed bilateral infiltrates concerning for aspiration PNA. He was started on antibiotics.   Patient was seen by Dr Curt Bears for ablation evaluation and was scheduled for the procedure but had a questionable CVA/TIA while visiting [place] and the procedure was cancelled.   On follow up today, patient reports that he feels well. He has been without a phone recently and this is why he has not rescheduled his ablation yet. He denies any tachypalpitations, no bleeding issues on anticoagulation.   Today, he denies symptoms of palpitations, chest pain, shortness of breath, orthopnea, PND, lower extremity edema, dizziness, presyncope, syncope, bleeding, or neurologic sequela. The patient is tolerating medications without difficulties and is otherwise without complaint today.    Atrial Fibrillation Risk Factors:  he does have symptoms or diagnosis of sleep apnea. he is compliant with CPAP therapy. he does not have a history of rheumatic fever.   he has a BMI of Body mass index is 33.3 kg/m.Marland Kitchen Filed Weights    01/05/22 1149  Weight: 120.8 kg    Family History  Problem Relation Age of Onset   Breast cancer Mother    Multiple myeloma Mother    Prostate cancer Father    Diabetes Father    Peripheral vascular disease Father    Cerebral palsy Daughter    Lung disease Neg Hx    Colon cancer Neg Hx    Stomach cancer Neg Hx    Esophageal cancer Neg Hx    Colon polyps Neg Hx      Atrial Fibrillation Management history:  Previous antiarrhythmic drugs: amiodarone Previous cardioversions: 2015 x 2, 10/01/20 Previous ablations: none CHADS2VASC score: 5 Anticoagulation history: Xarelto   Past Medical History:  Diagnosis Date   Anxiety    Arthritis    Benzodiazepine dependence (HCC)    Bronchial asthma    CAD (coronary artery disease)    a. Cath 03/2010: mod RCA stenosis, severe diag stenosis, treated medically given lack of angina.   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 Episode in 2008 with spontaneous conversion, on amiodarone per EP.  S/P implantation of automatic cardioverter/defibrillator (AICD)    a. Medtronic, implanted 2012.   Sleep apnea    Stroke [facility]) 2004   Thyroid disease    Past Surgical History:  Procedure Laterality Date   CARDIOVERSION N/A 01/07/2014   Procedure: CARDIOVERSION;  Surgeon: Lelon Perla, MD;  Location: [facility] ENDOSCOPY;  Service: Cardiovascular;  Laterality: N/A;   CARDIOVERSION N/A 02/11/2014   Procedure: CARDIOVERSION;  Surgeon: Pixie Casino, MD;  Location: [facility];  Service:  Cardiovascular;  Laterality: N/A;   CARDIOVERSION N/A 10/01/2020   Procedure: CARDIOVERSION;  Surgeon: Werner Lean, MD;  Location: [place] ENDOSCOPY;  Service: Cardiovascular;  Laterality: N/A;   CATARACT EXTRACTION     COLONOSCOPY WITH PROPOFOL N/A 02/06/2018   Procedure: COLONOSCOPY WITH PROPOFOL;  Surgeon: Yetta Flock, MD;  Location: [facility] ENDOSCOPY;  Service: Gastroenterology;  Laterality: N/A;   EYE SURGERY     ICD GENERATOR CHANGEOUT N/A 01/17/2020   Procedure: ICD GENERATOR CHANGEOUT;  Surgeon: Evans Lance, MD;  Location: [place] CV LAB;  Service: Cardiovascular;  Laterality: N/A;   LASEK eye surgery Bilateral 05/2018   LASIK Bilateral    PACEMAKER INSERTION     POLYPECTOMY  02/06/2018   Procedure: POLYPECTOMY;  Surgeon: Yetta Flock, MD;  Location: [facility] ENDOSCOPY;  Service: Gastroenterology;;   TEE WITHOUT CARDIOVERSION N/A 01/07/2014   Procedure: TRANSESOPHAGEAL ECHOCARDIOGRAM (TEE);  Surgeon: Lelon Perla, MD;  Location: [facility] ENDOSCOPY;  Service: Cardiovascular;  Laterality: N/A;   TEE WITHOUT CARDIOVERSION N/A 02/11/2014   Procedure: TRANSESOPHAGEAL ECHOCARDIOGRAM (TEE);  Surgeon: Pixie Casino, MD;  Location: [facility] ENDOSCOPY;  Service: Cardiovascular;  Laterality: N/A;   TEE WITHOUT CARDIOVERSION N/A 10/01/2020   Procedure: TRANSESOPHAGEAL ECHOCARDIOGRAM (TEE);  Surgeon: Werner Lean, MD;  Location: [facility] ENDOSCOPY;  Service: Cardiovascular;  Laterality: N/A;   TONSILLECTOMY     TRANSTHORACIC ECHOCARDIOGRAM  10/2006, 12/2009    Current Outpatient Medications  Medication Sig Dispense Refill   acetaminophen (TYLENOL) 500 MG tablet Take 1,000 mg by mouth every 6 (six) hours as needed for moderate pain or headache.     albuterol (PROVENTIL) (2.5 MG/3ML) 0.083% nebulizer solution Take 3 mLs (2.5 mg total) by nebulization every 6 (six) hours as needed for wheezing or shortness of breath. 150 mL 6   albuterol (VENTOLIN HFA) 108 (90 Base) MCG/ACT  inhaler Inhale 2 puffs into the lungs every 6 (six) hours as needed for wheezing or shortness of breath. 8.5 g 6   alprazolam (XANAX) 2 MG tablet Take 2 mg by mouth every 6 (six) hours as needed for anxiety.     amiodarone (PACERONE) 200 MG tablet Take 1 tablet (200 mg total) by mouth daily. 90 tablet 2   atorvastatin (LIPITOR) 10 MG tablet TAKE 1 TABLET(10 MG) BY MOUTH DAILY 90 tablet 2   carvedilol (COREG) 12.5 MG tablet Take 1.5 tabs PO twice a day PO 270 tablet 3   dapagliflozin propanediol (FARXIGA) 10 MG TABS tablet Take 1 tablet (10 mg total) by mouth daily before breakfast. 30 tablet 6   DULoxetine (CYMBALTA) 30 MG capsule Take 90 mg by mouth daily.      entecavir (BARACLUDE) 0.5 MG tablet Take 1 tablet (0.5 mg total) by mouth every other day. 45 tablet 1   ezetimibe (ZETIA) 10 MG tablet TAKE 1 TABLET(10 MG) BY MOUTH DAILY 90 tablet 2   ferrous sulfate 325 (65 FE) MG tablet Take 325 mg by mouth daily with breakfast.     fluticasone (FLONASE)  50 MCG/ACT nasal spray SHAKE LIQUID AND USE 1 SPRAY IN EACH NOSTRIL DAILY 16 g 4   furosemide (LASIX) 40 MG tablet TAKE 1/2 TABLET(20 MG) BY MOUTH DAILY 45 tablet 3   hydrALAZINE (APRESOLINE) 100 MG tablet Take 1 tablet (100 mg total) by mouth in the morning and at bedtime. (Patient taking differently: Take 100 mg by mouth 3 (three) times daily.) 180 tablet 1   ondansetron (ZOFRAN-ODT) 8 MG disintegrating tablet Take 8 mg by mouth 2 (two) times daily as needed for nausea or vomiting.     oxybutynin (DITROPAN) 5 MG tablet TAKE 1 TABLET(5 MG) BY MOUTH THREE TIMES DAILY 270 tablet 1   polyethylene glycol (MIRALAX) 17 g packet Take as directed, Daily, titrate as needed 14 each 0   RESTASIS 0.05 % ophthalmic emulsion 1 drop 2 (two) times daily.     sacubitril-valsartan (ENTRESTO) 49-51 MG Take 1 tablet by mouth 2 (two) times daily. 180 tablet 0   sildenafil (REVATIO) 20 MG tablet Take 1 tablet (20 mg total) by mouth as needed. 90 tablet 0   SYMBICORT  160-4.5 MCG/ACT inhaler INHALE 2 PUFFS INTO THE LUNGS TWICE DAILY 10.2 g 2   tamsulosin (FLOMAX) 0.4 MG CAPS capsule TAKE 1 CAPSULE(0.4 MG) BY MOUTH DAILY 90 capsule 1   XARELTO 20 MG TABS tablet TAKE 1 TABLET(20 MG) BY MOUTH DAILY WITH SUPPER 90 tablet 1   No current facility-administered medications for this encounter.    Allergies  Allergen Reactions   Prochlorperazine Other (See Comments)    Medication caused a lot of issues like blurred vision, nausea, pain, and carpool turnel   Codeine Itching, Nausea And Vomiting and Hives   Hydrocodone-Acetaminophen Itching and Nausea And Vomiting    Social History   Socioeconomic History   Marital status: Divorced    Spouse name: Not on file   Number of children: 2   Years of education: Not on file   Highest education level: Not on file  Occupational History   Occupation: Unemployed    Employer: UNEMPLOYED    Comment: Writer in the past  Tobacco Use   Smoking status: Former    Packs/day: 0.50    Years: 0.50    Total pack years: 0.25    Types: Cigarettes    Quit date: 05/02/2010    Years since quitting: 11.6   Smokeless tobacco: Never   Tobacco comments:    Former smoker Significant Second-hand and only 3 months himself 01/05/22  Vaping Use   Vaping Use: Never used  Substance and Sexual Activity   Alcohol use: Not Currently    Alcohol/week: 0.0 standard drinks of alcohol    Comment: rare   Drug use: No    Comment: Reported history of cocaine abuse off and on    Sexual activity: Not on file  Other Topics Concern   Not on file  Social History Narrative   Living with a son who is a 8 year old.  He is not     working, unemployed for 3 years.  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  Denied use of alcohol.  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       [facility]:   Originally from [place], [place]. He grew up in [place]. He moved to [place] in 1985. 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 No pets currently. No bird exposure. He  did have mold in a previous home. No hot tub exposure.         Social Determinants of Health   Financial Resource Strain: Not on file  Food Insecurity: Not on file  Transportation Needs: Not on file  Physical Activity: Not on file  Stress: Not on file  Social Connections: Not on file  Intimate Partner Violence: Not on file     ROS- All systems are reviewed and negative except as per the HPI above.  Physical Exam: Vitals:   01/05/22 1149  BP: 118/80  Pulse: 65  Weight: 120.8 kg  Height: $Remove'6\' 3"'[id number]$  (1.905 m)     GEN- The patient is a well appearing obese male, alert and oriented x 3 today.   HEENT-head normocephalic, atraumatic, sclera clear, conjunctiva pink, hearing intact, trachea midline. Lungs- Clear to ausculation bilaterally, normal work of breathing Heart- Regular rate and rhythm, no murmurs, rubs or gallops  GI- soft, NT, ND, + BS Extremities- no clubbing, cyanosis, or edema MS- no significant deformity or atrophy Skin- no rash or lesion Psych- euthymic mood, full affect Neuro- strength and sensation are intact   Wt Readings from Last 3 Encounters:  01/05/22 120.8 kg  12/22/21 122.6 kg  08/19/21 125.2 kg    EKG today demonstrates  SR, LBBB, PVC Vent. rate 65 BPM PR interval 182 ms QRS duration 124 ms QT/QTcB 450/468 ms  Echo 05/29/20 demonstrated   1. Left ventricular ejection fraction, by estimation, is 25 to 30%. The  left ventricle has severely decreased function. The left ventricle  demonstrates regional wall motion abnormalities (see scoring  diagram/findings for description). There is moderate  left ventricular hypertrophy. Left ventricular diastolic parameters are  consistent with Grade II diastolic dysfunction (pseudonormalization).   2. Right ventricular systolic function is normal. The right ventricular  size is normal. There is normal pulmonary  artery systolic pressure.   3. Left atrial size was severely dilated.   4. Right atrial size was mildly dilated.   5. The mitral valve is normal in structure. No evidence of mitral valve regurgitation. No evidence of mitral stenosis.   6. The aortic valve is normal in structure. Aortic valve regurgitation is not visualized. No aortic stenosis is present.   7. The inferior vena cava is normal in size with greater than 50%  respiratory variability, suggesting right atrial pressure of 3 mmHg.   Epic records are reviewed at length today  CHA2DS2-VASc Score = 5  The patient's score is based upon: CHF History: 1 HTN History: 1 Diabetes History: 0 Stroke History: 2 Vascular Disease History: 1 Age Score: 0 Gender Score: 0      ASSESSMENT AND PLAN: 1. Persistent Atrial Fibrillation (ICD10:  I48.19) The patient's CHA2DS2-VASc score is 5, indicating a 7.2% annual risk of stroke.   Patient appears to be maintaining SR on amiodarone. He would like to have an ablation to come off this medication. Dr Curt Bears office number given today and encouraged him to call. He plans to go get another phone later today.  Continue amiodarone 200 mg daily Continue Xarelto 20 mg daily Continue carvedilol 18.75 mg BID  2. Secondary Hypercoagulable State (ICD10:  D68.69) The patient is at significant risk for stroke/thromboembolism based upon his CHA2DS2-VASc Score of 5.  Continue Rivaroxaban (Xarelto).   3. Obesity Body mass index is 33.3 kg/m. Lifestyle modification was discussed and encouraged including regular physical activity and weight reduction.  4. Obstructive sleep apnea Encouraged compliance with CPAP therapy.  5. Chronic  systolic CHF EF 00-51% Fluid status appears stable.  6. CAD No anginal symptoms.  7. HTN Stable, no changes today.   Follow up with Dr Curt Bears for ablation.    [facility] [address] [place], [place]  [address] [phone] 01/05/2022 11:56 AM

## 2022-01-06 NOTE — Progress Notes (Signed)
[facility] Note  01/12/2022     CHIEF COMPLAINT Patient presents for Retina Follow Up    HISTORY OF PRESENT ILLNESS: Cody Ortega is a 61 y.o. male who presents to the clinic today for:   HPI     Retina Follow Up   Patient presents with  Other.  In right eye.  Duration of 3 months.  Since onset it is stable.  I, the attending physician,  performed the HPI with the patient and updated documentation appropriately.        Comments   Patient states that the vision is fuzzy in both eyes.       Last edited by Bernarda Caffey, MD on 01/14/2022  1:06 PM.    Pt states he just woke up and his eyes have not "regulated" to being awake yet, his vision is blurry today  Referring physician: Ladell Pier, MD [place],  [place] [address]  HISTORICAL INFORMATION:   Selected notes from the [place] Referred by Dr. [name] for decreased vision and floaters x3 days    CURRENT MEDICATIONS: Current Outpatient Medications (Ophthalmic Drugs)  Medication Sig   RESTASIS 0.05 % ophthalmic emulsion 1 drop 2 (two) times daily.   No current facility-administered medications for this visit. (Ophthalmic Drugs)   Current Outpatient Medications (Other)  Medication Sig   acetaminophen (TYLENOL) 500 MG tablet Take 1,000 mg by mouth every 6 (six) hours as needed for moderate pain or headache.   albuterol (PROVENTIL) (2.5 MG/3ML) 0.083% nebulizer solution Take 3 mLs (2.5 mg total) by nebulization every 6 (six) hours as needed for wheezing or shortness of breath.   albuterol (VENTOLIN HFA) 108 (90 Base) MCG/ACT inhaler Inhale 2 puffs into the lungs every 6 (six) hours as needed for wheezing or shortness of breath.   alprazolam (XANAX) 2 MG tablet Take 2 mg by mouth every 6 (six) hours as needed for anxiety.   amiodarone (PACERONE) 200 MG tablet Take 1 tablet (200 mg total) by mouth daily.   amoxicillin (AMOXIL) 875 MG tablet Take 1  tablet (875 mg total) by mouth 2 (two) times daily for 10 days.   atorvastatin (LIPITOR) 10 MG tablet TAKE 1 TABLET(10 MG) BY MOUTH DAILY   carvedilol (COREG) 12.5 MG tablet Take 1.5 tabs PO twice a day PO   dapagliflozin propanediol (FARXIGA) 10 MG TABS tablet Take 1 tablet (10 mg total) by mouth daily before breakfast.   DULoxetine (CYMBALTA) 30 MG capsule Take 90 mg by mouth daily.    entecavir (BARACLUDE) 0.5 MG tablet Take 1 tablet (0.5 mg total) by mouth every other day.   ezetimibe (ZETIA) 10 MG tablet TAKE 1 TABLET(10 MG) BY MOUTH DAILY   ferrous sulfate 325 (65 FE) MG tablet Take 325 mg by mouth daily with breakfast.   fluticasone (FLONASE) 50 MCG/ACT nasal spray SHAKE LIQUID AND USE 1 SPRAY IN EACH NOSTRIL DAILY   furosemide (LASIX) 40 MG tablet TAKE 1/2 TABLET(20 MG) BY MOUTH DAILY   hydrALAZINE (APRESOLINE) 100 MG tablet Take 1 tablet (100 mg total) by mouth in the morning and at bedtime. (Patient taking differently: Take 100 mg by mouth 3 (three) times daily.)   ondansetron (ZOFRAN-ODT) 8 MG disintegrating tablet Take 8 mg by mouth 2 (two) times daily as needed for nausea or vomiting.   oxybutynin (DITROPAN) 5 MG tablet TAKE 1 TABLET(5 MG) BY MOUTH THREE TIMES DAILY   polyethylene glycol (  MIRALAX) 17 g packet Take as directed, Daily, titrate as needed   sacubitril-valsartan (ENTRESTO) 49-51 MG Take 1 tablet by mouth 2 (two) times daily.   sildenafil (REVATIO) 20 MG tablet Take 1 tablet (20 mg total) by mouth as needed.   SYMBICORT 160-4.5 MCG/ACT inhaler INHALE 2 PUFFS INTO THE LUNGS TWICE DAILY   tamsulosin (FLOMAX) 0.4 MG CAPS capsule TAKE 1 CAPSULE(0.4 MG) BY MOUTH DAILY   XARELTO 20 MG TABS tablet TAKE 1 TABLET(20 MG) BY MOUTH DAILY WITH SUPPER   No current facility-administered medications for this visit. (Other)   REVIEW OF SYSTEMS: ROS   Positive for: Gastrointestinal, Neurological, Genitourinary, Musculoskeletal, Endocrine, Cardiovascular, Eyes, Respiratory Negative for:  Constitutional, Skin, HENT, Psychiatric, Allergic/Imm, Heme/Lymph Last edited by Annie Paras, COT on 01/12/2022  1:04 PM.     ALLERGIES Allergies  Allergen Reactions   Prochlorperazine Other (See Comments)    Medication caused a lot of issues like blurred vision, nausea, pain, and carpool turnel   Codeine Itching, Nausea And Vomiting and Hives   Hydrocodone-Acetaminophen Itching and Nausea And Vomiting   PAST MEDICAL HISTORY Past Medical History:  Diagnosis Date   Anxiety    Arthritis    Benzodiazepine dependence ([place])    Bronchial asthma    CAD (coronary artery disease)    a. Cath 03/2010: mod RCA stenosis, severe diag stenosis, treated medically given lack of angina.   Cardiomyopathy    possible cocaine induced   Chronic systolic congestive heart failure (HCC)    Cirrhosis (HCC)    secondary to hepatitis B   CKD (chronic kidney disease), stage IV (HCC)    Stage 3-4   Depression    Diverticulosis    Hepatitis B    History of cocaine abuse ([place])    Hyperlipidemia LDL goal <70    Hypermetropia of both eyes not needing correction 09/29/2020   Hypertension    Hypertensive retinopathy    Microcytic anemia    Obesity    Persistent atrial fibrillation ([place])    a. Episode in 2008 with spontaneous conversion, on amiodarone per EP.   S/P implantation of automatic cardioverter/defibrillator (AICD)    a. Medtronic, implanted 2012.   Sleep apnea    Stroke [facility]) 2004   Thyroid disease    Past Surgical History:  Procedure Laterality Date   CARDIOVERSION N/A 01/07/2014   Procedure: CARDIOVERSION;  Surgeon: Lelon Perla, MD;  Location: [facility] ENDOSCOPY;  Service: Cardiovascular;  Laterality: N/A;   CARDIOVERSION N/A 02/11/2014   Procedure: CARDIOVERSION;  Surgeon: Pixie Casino, MD;  Location: [place] [facility];  Service: Cardiovascular;  Laterality: N/A;   CARDIOVERSION N/A 10/01/2020   Procedure: CARDIOVERSION;  Surgeon: Werner Lean, MD;  Location: [facility]  ENDOSCOPY;  Service: Cardiovascular;  Laterality: N/A;   CATARACT EXTRACTION     COLONOSCOPY WITH PROPOFOL N/A 02/06/2018   Procedure: COLONOSCOPY WITH PROPOFOL;  Surgeon: Yetta Flock, MD;  Location: [facility] ENDOSCOPY;  Service: Gastroenterology;  Laterality: N/A;   EYE SURGERY     ICD GENERATOR CHANGEOUT N/A 01/17/2020   Procedure: ICD GENERATOR CHANGEOUT;  Surgeon: Evans Lance, MD;  Location: [facility] LAB;  Service: Cardiovascular;  Laterality: N/A;   LASEK eye surgery Bilateral 05/2018   LASIK Bilateral    PACEMAKER INSERTION     POLYPECTOMY  02/06/2018   Procedure: POLYPECTOMY;  Surgeon: Yetta Flock, MD;  Location: [facility] ENDOSCOPY;  Service: Gastroenterology;;   TEE WITHOUT CARDIOVERSION N/A 01/07/2014   Procedure: TRANSESOPHAGEAL ECHOCARDIOGRAM (TEE);  Surgeon: Lelon Perla, MD;  Location: [facility] ENDOSCOPY;  Service: Cardiovascular;  Laterality: N/A;   TEE WITHOUT CARDIOVERSION N/A 02/11/2014   Procedure: TRANSESOPHAGEAL ECHOCARDIOGRAM (TEE);  Surgeon: Pixie Casino, MD;  Location: [facility] ENDOSCOPY;  Service: Cardiovascular;  Laterality: N/A;   TEE WITHOUT CARDIOVERSION N/A 10/01/2020   Procedure: TRANSESOPHAGEAL ECHOCARDIOGRAM (TEE);  Surgeon: Werner Lean, MD;  Location: [facility] ENDOSCOPY;  Service: Cardiovascular;  Laterality: N/A;   TONSILLECTOMY     TRANSTHORACIC ECHOCARDIOGRAM  10/2006, 12/2009   FAMILY HISTORY Family History  Problem Relation Age of Onset   Breast cancer Mother    Multiple myeloma Mother    Prostate cancer Father    Diabetes Father    Peripheral vascular disease Father    Cerebral palsy Daughter    Lung disease Neg Hx    Colon cancer Neg Hx    Stomach cancer Neg Hx    Esophageal cancer Neg Hx    Colon polyps Neg Hx    SOCIAL HISTORY Social History   Tobacco Use   Smoking status: Former    Packs/day: 0.50    Years: 0.50    Total pack years: 0.25    Types: Cigarettes    Quit date: 05/02/2010    Years since quitting: 11.7    Smokeless tobacco: Never   Tobacco comments:    Former smoker Significant Second-hand and only 3 months himself 01/05/22  Vaping Use   Vaping Use: Never used  Substance Use Topics   Alcohol use: Not Currently    Alcohol/week: 0.0 standard drinks of alcohol    Comment: rare   Drug use: No    Comment: Reported history of cocaine abuse off and on        OPHTHALMIC EXAM:  Base Eye Exam     Visual Acuity (Snellen - Linear)       Right Left   Dist [place] 20/30 +1 20/40   Dist ph [place] 20/25 +1 20/30         Tonometry (Tonopen, 1:08 PM)       Right Left   Pressure 7 9         Pupils       Dark Light Shape React APD   Right 3 2 Round Brisk None   Left 3 2 Round Brisk None         Visual Fields       Left Right    Full    Restrictions  Total superior nasal deficiency         Extraocular Movement       Right Left    Full, Ortho Full, Ortho         Neuro/Psych     Oriented x3: Yes   Mood/Affect: Normal         Dilation     Both eyes: 1.0% Mydriacyl, 2.5% Phenylephrine @ 1:04 PM           Slit Lamp and Fundus Exam     Slit Lamp Exam       Right Left   Lids/Lashes Dermatochalasis - upper lid, mild Meibomian gland dysfunction Dermatochalasis - upper lid, mild Meibomian gland dysfunction   Conjunctiva/Sclera White and quiet White and quiet   Cornea Mild arcus, well healed temporal cataract wounds, mild tear film debris, 1+ Punctate epithelial erosions well healed temporal cataract wounds, mild Arcus, 2+ inferior Punctate epithelial erosions, mild tear film debris   Anterior Chamber Deep and quiet Deep and quiet   Iris  Round and dilated Round and dilated   Lens PC IOL in good position, trace Posterior capsular opacification PC IOL in good position, 2+ Posterior capsular opacification   Anterior Vitreous Vitreous syneresis Vitreous syneresis, PVD, no heme         Fundus Exam       Right Left   Disc Inferior Pallor, vascular loops, Sharp rim  Pink and Sharp, mild temporal Pallor   C/D Ratio 0.3 0.4   Macula Flat, Blunted foveal reflex, mild Retinal pigment epithelial mottling, inferior atrophy Flat, good foveal reflex, focal RPE disruption IN fovea, no heme or edema   Vessels Severe Vascular attenuation, superior hemisphere with telangectasias and perivascular exudation / sheathing -- stable, Copper wiring, plaques in ST arterioles, Tortuous, no NV, BRAO Vascular attenuation, tortuous, mild Copper wiring, mild AV crossing changes   Periphery Attached, no heme Attached, inferior lattice, peripheral cystoid degeneration, No RT/RD           IMAGING AND PROCEDURES  Imaging and Procedures for $RemoveBefore'@TODAY'[id number]$ @  OCT, Retina - OU - Both Eyes       Right Eye Quality was good. Central Foveal Thickness: 228. Progression has been stable. Findings include no IRF, no SRF, abnormal foveal contour (Severe inner retinal atrophy inferior macula - stable).   Left Eye Quality was borderline. Central Foveal Thickness: 236. Progression has been stable. Findings include normal foveal contour, no IRF, no SRF, retinal drusen (Focal ellipsoid disruption inferonasal fovea).   Notes *Images captured and stored on drive  Diagnosis / Impression:  No IRF/SRF OU OD: BRAO w/ severe inner retinal atrophy inferior macula - stable OS: Focal ellipsoid disruption inferonasal fovea  Clinical management:  See below  Abbreviations: NFP - Normal foveal profile. CME - cystoid macular edema. PED - pigment epithelial detachment. IRF - intraretinal fluid. SRF - subretinal fluid. EZ - ellipsoid zone. ERM - epiretinal membrane. ORA - outer retinal atrophy. ORT - outer retinal tubulation. SRHM - subretinal hyper-reflective material      Yag Capsulotomy - OS - Left Eye       Time Out Confirmed correct patient, procedure, site, and patient consented.   Anesthesia Topical anesthesia was used.   Notes Procedure note: YAG Capsulotomy, Left Eye  Informed consent  obtained. Pre-op dilating drops (1% Topicamide and 2.5% Phenylephrine), and topical anesthesia given. Power: 6.1 mJ Shots: 36 Posterior capsulotomy in cruciate formation performed without difficulty. Patient tolerated procedure well. No complications. LotemaxSM 4 times a day for 7 days, then stop. Pt received written and verbal post laser education. Recheck in 2 weeks w/ dilated exam.            ASSESSMENT/PLAN:    ICD-10-CM   1. Retinal artery branch occlusion of right eye  H34.231 OCT, Retina - OU - Both Eyes    2. Posterior vitreous detachment of left eye  H43.812     3. Vitreous hemorrhage of left eye (HCC)  H43.12     4. Essential hypertension  I10     5. Hypertensive retinopathy of both eyes  H35.033     6. Pseudophakia of both eyes  Z96.1     7. PCO (posterior capsular opacification), left  H26.492 Yag Capsulotomy - OS - Left Eye     1. 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 History of hemorrhagic PVD OS  - Onset late  March 2021 -- presented to Dr. Jerilynn Mages. Le per pt history  - Symptoms resolved, VH cleared  - Discussed findings and prognosis  - No RT or RD on 360 scleral depressed exam  - Reviewed s/s of RT/RD  - Strict return precautions for any such RT/RD signs/symptoms  4,5. Hypertensive retinopathy OU  - discussed importance of tight BP control  - monitor  6. Pseudophakia OU  - s/p CE/IOL OU  - beautiful surgeries, doing well  - monitor  7. PCO OU  - discussed findings  - discussed treatment options   - recommend YAG capsulotomy OS today, 09.13.2023  - pt wishes to proceed with YAG cap OS  - RBA of procedure discussed, questions answered - informed consent obtained and signed - see procedure note - start Lotemaz QID OS X5-7 days - f/u 2 weeks, DFE, OCT  Ophthalmic Meds Ordered this visit:  No orders of the defined types were placed in  this encounter.    Return for f/u 2 weeks, s/p Yag OS, DFE, OCT.  There are no Patient Instructions on file for this visit.   This document serves as a record of services personally performed by Gardiner Sleeper, MD, PhD. It was created on their behalf by Orvan Falconer, an ophthalmic technician. The creation of this record is the provider's dictation and/or activities during the visit.    Electronically signed by: Orvan Falconer, OA, 01/14/22  1:07 PM   This document serves as a record of services personally performed by Gardiner Sleeper, MD, PhD. It was created on their behalf by San Jetty. Owens Shark, OA an ophthalmic technician. The creation of this record is the provider's dictation and/or activities during the visit.    Electronically signed by: San Jetty. Owens Shark, [place] 09.13.2023 1:07 PM  Gardiner Sleeper, M.D., Ph.D. Diseases & Surgery of the Retina and Vitreous [facility]  I have reviewed the above documentation for accuracy and completeness, and I agree with the above. Gardiner Sleeper, M.D., Ph.D. 01/14/22 1:08 PM  Abbreviations: M myopia (nearsighted); A astigmatism; H hyperopia (farsighted); P presbyopia; Mrx spectacle prescription;  CTL contact lenses; OD right eye; OS left eye; OU both eyes  XT exotropia; ET esotropia; PEK punctate epithelial keratitis; PEE punctate epithelial erosions; DES dry eye syndrome; MGD meibomian gland dysfunction; ATs artificial tears; PFAT's preservative free artificial tears; [place] nuclear sclerotic cataract; PSC posterior subcapsular cataract; ERM epi-retinal membrane; PVD posterior vitreous detachment; RD retinal detachment; DM diabetes mellitus; DR diabetic retinopathy; NPDR non-proliferative diabetic retinopathy; PDR proliferative diabetic retinopathy; CSME clinically significant macular edema; DME diabetic macular edema; dbh dot blot hemorrhages; CWS cotton wool spot; POAG primary open angle glaucoma; C/D cup-to-disc ratio; HVF  humphrey visual field; GVF goldmann visual field; OCT optical coherence tomography; IOP intraocular pressure; BRVO Branch retinal vein occlusion; CRVO central retinal vein occlusion; CRAO central retinal artery occlusion; BRAO branch retinal artery occlusion; RT retinal tear; SB scleral buckle; PPV pars plana vitrectomy; VH Vitreous hemorrhage; PRP panretinal laser photocoagulation; IVK intravitreal kenalog; VMT vitreomacular traction; MH Macular hole;  NVD neovascularization of the disc; NVE neovascularization elsewhere; AREDS age related eye disease study; ARMD age related macular degeneration; POAG primary open angle glaucoma; EBMD epithelial/anterior basement membrane dystrophy; ACIOL anterior chamber intraocular lens; IOL intraocular lens; PCIOL posterior chamber intraocular lens; Phaco/IOL phacoemulsification with intraocular lens placement; [place] photorefractive keratectomy; LASIK laser assisted in situ keratomileusis; HTN hypertension; DM diabetes mellitus; COPD chronic obstructive pulmonary disease

## 2022-01-07 NOTE — Progress Notes (Signed)
No ICM remote transmission received for 01/04/2022 and next ICM transmission scheduled for 01/17/2022.

## 2022-01-12 ENCOUNTER — Ambulatory Visit (INDEPENDENT_AMBULATORY_CARE_PROVIDER_SITE_OTHER): Payer: Medicaid Other | Admitting: Ophthalmology

## 2022-01-12 ENCOUNTER — Encounter (INDEPENDENT_AMBULATORY_CARE_PROVIDER_SITE_OTHER): Payer: Self-pay | Admitting: Ophthalmology

## 2022-01-12 ENCOUNTER — Ambulatory Visit: Payer: Medicaid Other | Admitting: Family Medicine

## 2022-01-12 ENCOUNTER — Encounter: Payer: Self-pay | Admitting: Family Medicine

## 2022-01-12 VITALS — BP 149/95 | HR 52 | Temp 97.3°F | Ht 75.0 in | Wt 272.0 lb

## 2022-01-12 DIAGNOSIS — I10 Essential (primary) hypertension: Secondary | ICD-10-CM | POA: Diagnosis not present

## 2022-01-12 DIAGNOSIS — H4312 Vitreous hemorrhage, left eye: Secondary | ICD-10-CM | POA: Diagnosis not present

## 2022-01-12 DIAGNOSIS — H35033 Hypertensive retinopathy, bilateral: Secondary | ICD-10-CM | POA: Diagnosis not present

## 2022-01-12 DIAGNOSIS — H43812 Vitreous degeneration, left eye: Secondary | ICD-10-CM | POA: Diagnosis not present

## 2022-01-12 DIAGNOSIS — Z961 Presence of intraocular lens: Secondary | ICD-10-CM

## 2022-01-12 DIAGNOSIS — K047 Periapical abscess without sinus: Secondary | ICD-10-CM

## 2022-01-12 DIAGNOSIS — H26492 Other secondary cataract, left eye: Secondary | ICD-10-CM | POA: Diagnosis not present

## 2022-01-12 DIAGNOSIS — H34231 Retinal artery branch occlusion, right eye: Secondary | ICD-10-CM | POA: Diagnosis not present

## 2022-01-12 MED ORDER — AMOXICILLIN 875 MG PO TABS: 875.0000 mg | ORAL_TABLET | Freq: Two times a day (BID) | ORAL | 0 refills | Status: AC

## 2022-01-12 NOTE — Progress Notes (Unsigned)
   Acute Office Visit  Subjective:     Patient ID: Cody Ortega, male    DOB: 1960/06/03, 61 y.o.   MRN: [id number]  Chief Complaint  Patient presents with   Dental Pain    C/O tooth pain X 3 weeks.    HPI Patient is in today for ***  ROS      Objective:    BP (!) 149/95   Pulse (!) 52   Temp (!) 97.3 F (36.3 C)   Ht '6\' 3"'$  (1.905 m)   Wt 272 lb (123.4 kg)   SpO2 96%   BMI 34.00 kg/m  {Vitals History (Optional):23777}  Physical Exam  No results found for any visits on 01/12/22.      Assessment & Plan:   Problem List Items Addressed This Visit   None   No orders of the defined types were placed in this encounter.   No follow-ups on file.  Elwin Mocha, MD

## 2022-01-12 NOTE — Patient Instructions (Signed)
[email or web address]

## 2022-01-20 ENCOUNTER — Telehealth: Payer: Self-pay

## 2022-01-20 NOTE — Telephone Encounter (Signed)
LMOVM for patient to send missed ICM transmission. 

## 2022-01-24 ENCOUNTER — Telehealth: Payer: Self-pay

## 2022-01-24 DIAGNOSIS — B181 Chronic viral hepatitis B without delta-agent: Secondary | ICD-10-CM

## 2022-01-24 DIAGNOSIS — K7460 Unspecified cirrhosis of liver: Secondary | ICD-10-CM

## 2022-01-24 NOTE — Telephone Encounter (Signed)
Left patient a detailed vm with lab reminder. Advised that patient can stop by the lab at his convenience to complete labs. Pt advised that he is due for routine RUQ [place] as well. Pt advised that radiology scheduling will contact him directly to set up his appt. Advised that patient is also due for an office visit with Dr. Havery Moros. Pt advised to call the office to set up appt and to call with any questions.   Lab orders in epic. RUQ [place] order in epic. Secure staff message sent to radiology scheduling to set up appt.

## 2022-01-24 NOTE — Telephone Encounter (Signed)
-----   Message from Yevette Edwards, RN sent at 07/23/2021  8:42 AM EDT ----- Regarding: Labs/US Repeat CBC, CMET, PT/INR, AFP, Hep B DNA PCR - need to enter orders RUQ [place] - cirrhosis

## 2022-01-24 NOTE — Progress Notes (Signed)
No ICM remote transmission received for 01/17/2022 and next ICM transmission scheduled for 02/28/2022.

## 2022-01-27 NOTE — Telephone Encounter (Signed)
Lm on vm for patient to return call 

## 2022-01-28 NOTE — Progress Notes (Signed)
[facility] Note  01/31/2022     CHIEF COMPLAINT Patient presents for Post-op Follow-up    HISTORY OF PRESENT ILLNESS: Cody Ortega is a 61 y.o. male who presents to the clinic today for:   HPI     Post-op Follow-up   In left eye.  I, the attending physician,  performed the HPI with the patient and updated documentation appropriately.        Comments   Patient states vision improved OS after 2-3 days of having yag OS.      Last edited by Bernarda Caffey, MD on 02/01/2022  1:23 AM.     Pt states there is a marked improvement in left eye vision   Referring physician: Ladell Pier, MD [place],  [place] [address]  HISTORICAL INFORMATION:   Selected notes from the MEDICAL RECORD NUMBER Referred by Dr. [name] for decreased vision and floaters x3 days    CURRENT MEDICATIONS: Current Outpatient Medications (Ophthalmic Drugs)  Medication Sig   RESTASIS 0.05 % ophthalmic emulsion 1 drop 2 (two) times daily.   No current facility-administered medications for this visit. (Ophthalmic Drugs)   Current Outpatient Medications (Other)  Medication Sig   acetaminophen (TYLENOL) 500 MG tablet Take 1,000 mg by mouth every 6 (six) hours as needed for moderate pain or headache.   albuterol (PROVENTIL) (2.5 MG/3ML) 0.083% nebulizer solution Take 3 mLs (2.5 mg total) by nebulization every 6 (six) hours as needed for wheezing or shortness of breath.   albuterol (VENTOLIN HFA) 108 (90 Base) MCG/ACT inhaler Inhale 2 puffs into the lungs every 6 (six) hours as needed for wheezing or shortness of breath.   alprazolam (XANAX) 2 MG tablet Take 2 mg by mouth every 6 (six) hours as needed for anxiety.   amiodarone (PACERONE) 200 MG tablet Take 1 tablet (200 mg total) by mouth daily.   atorvastatin (LIPITOR) 10 MG tablet TAKE 1 TABLET(10 MG) BY MOUTH DAILY   carvedilol (COREG) 12.5 MG tablet Take 1.5 tabs PO twice a day PO    dapagliflozin propanediol (FARXIGA) 10 MG TABS tablet Take 1 tablet (10 mg total) by mouth daily before breakfast.   DULoxetine (CYMBALTA) 30 MG capsule Take 90 mg by mouth daily.    entecavir (BARACLUDE) 0.5 MG tablet Take 1 tablet (0.5 mg total) by mouth every other day.   ezetimibe (ZETIA) 10 MG tablet TAKE 1 TABLET(10 MG) BY MOUTH DAILY   ferrous sulfate 325 (65 FE) MG tablet Take 325 mg by mouth daily with breakfast.   fluticasone (FLONASE) 50 MCG/ACT nasal spray SHAKE LIQUID AND USE 1 SPRAY IN EACH NOSTRIL DAILY   furosemide (LASIX) 40 MG tablet TAKE 1/2 TABLET(20 MG) BY MOUTH DAILY   hydrALAZINE (APRESOLINE) 100 MG tablet Take 1 tablet (100 mg total) by mouth in the morning and at bedtime. (Patient taking differently: Take 100 mg by mouth 3 (three) times daily.)   ondansetron (ZOFRAN-ODT) 8 MG disintegrating tablet Take 8 mg by mouth 2 (two) times daily as needed for nausea or vomiting.   oxybutynin (DITROPAN) 5 MG tablet TAKE 1 TABLET(5 MG) BY MOUTH THREE TIMES DAILY   polyethylene glycol (MIRALAX) 17 g packet Take as directed, Daily, titrate as needed   sacubitril-valsartan (ENTRESTO) 49-51 MG Take 1 tablet by mouth 2 (two) times daily.   sildenafil (REVATIO) 20 MG tablet Take 1 tablet (20 mg total) by mouth as needed.   SYMBICORT 160-4.5 MCG/ACT  inhaler INHALE 2 PUFFS INTO THE LUNGS TWICE DAILY   tamsulosin (FLOMAX) 0.4 MG CAPS capsule TAKE 1 CAPSULE(0.4 MG) BY MOUTH DAILY   XARELTO 20 MG TABS tablet TAKE 1 TABLET(20 MG) BY MOUTH DAILY WITH SUPPER   No current facility-administered medications for this visit. (Other)   REVIEW OF SYSTEMS: ROS   Positive for: Gastrointestinal, Neurological, Genitourinary, Musculoskeletal, Endocrine, Cardiovascular, Eyes, Respiratory Negative for: Constitutional, Skin, HENT, Psychiatric, Allergic/Imm, Heme/Lymph Last edited by Jobe Marker, COT on 01/31/2022  2:09 PM.     ALLERGIES Allergies  Allergen Reactions   Prochlorperazine Other (See  Comments)    Medication caused a lot of issues like blurred vision, nausea, pain, and carpool turnel   Codeine Itching, Nausea And Vomiting and Hives   Hydrocodone-Acetaminophen Itching and Nausea And Vomiting   PAST MEDICAL HISTORY Past Medical History:  Diagnosis Date   Anxiety    Arthritis    Benzodiazepine dependence ([place])    Bronchial asthma    CAD (coronary artery disease)    a. Cath 03/2010: mod RCA stenosis, severe diag stenosis, treated medically given lack of angina.   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 Episode in 2008 with spontaneous conversion, on amiodarone per EP.   S/P implantation of automatic cardioverter/defibrillator (AICD)    a. Medtronic, implanted 2012.   Sleep apnea    Stroke [facility]) 2004   Thyroid disease    Past Surgical History:  Procedure Laterality Date   CARDIOVERSION N/A 01/07/2014   Procedure: CARDIOVERSION;  Surgeon: Lelon Perla, MD;  Location: [facility] ENDOSCOPY;  Service: Cardiovascular;  Laterality: N/A;   CARDIOVERSION N/A 02/11/2014   Procedure: CARDIOVERSION;  Surgeon: Pixie Casino, MD;  Location: [place];  Service: Cardiovascular;  Laterality: N/A;   CARDIOVERSION N/A 10/01/2020   Procedure: CARDIOVERSION;  Surgeon: Werner Lean, MD;  Location: [facility] ENDOSCOPY;  Service: Cardiovascular;  Laterality: N/A;   CATARACT EXTRACTION     COLONOSCOPY WITH PROPOFOL N/A 02/06/2018   Procedure: COLONOSCOPY WITH PROPOFOL;  Surgeon: Yetta Flock, MD;  Location: [facility]  ENDOSCOPY;  Service: Gastroenterology;  Laterality: N/A;   EYE SURGERY     ICD GENERATOR CHANGEOUT N/A 01/17/2020   Procedure: ICD GENERATOR CHANGEOUT;  Surgeon: Evans Lance, MD;  Location: [facility];  Service: Cardiovascular;  Laterality: N/A;   LASEK eye surgery Bilateral 05/2018   LASIK Bilateral    PACEMAKER INSERTION     POLYPECTOMY  02/06/2018   Procedure: POLYPECTOMY;  Surgeon: Yetta Flock, MD;  Location: [facility] ENDOSCOPY;  Service: Gastroenterology;;   TEE WITHOUT CARDIOVERSION N/A 01/07/2014   Procedure: TRANSESOPHAGEAL ECHOCARDIOGRAM (TEE);  Surgeon: Lelon Perla, MD;  Location: [facility] ENDOSCOPY;  Service: Cardiovascular;  Laterality: N/A;   TEE WITHOUT CARDIOVERSION N/A 02/11/2014   Procedure: TRANSESOPHAGEAL ECHOCARDIOGRAM (TEE);  Surgeon: Pixie Casino, MD;  Location: [facility] ENDOSCOPY;  Service: Cardiovascular;  Laterality: N/A;   TEE WITHOUT  CARDIOVERSION N/A 10/01/2020   Procedure: TRANSESOPHAGEAL ECHOCARDIOGRAM (TEE);  Surgeon: Werner Lean, MD;  Location: [facility] ENDOSCOPY;  Service: Cardiovascular;  Laterality: N/A;   TONSILLECTOMY     TRANSTHORACIC ECHOCARDIOGRAM  10/2006, 12/2009   FAMILY HISTORY Family History  Problem Relation Age of Onset   Breast cancer Mother    Multiple myeloma Mother    Prostate cancer Father    Diabetes Father    Peripheral vascular disease Father    Cerebral palsy Daughter    Lung disease Neg Hx    Colon cancer Neg Hx    Stomach cancer Neg Hx    Esophageal cancer Neg Hx    Colon polyps Neg Hx    SOCIAL HISTORY Social History   Tobacco Use   Smoking status: Former    Packs/day: 0.50    Years: 0.50    Total pack years: 0.25    Types: Cigarettes    Quit date: 05/02/2010    Years since quitting: 11.7   Smokeless tobacco: Never   Tobacco comments:    Former smoker Significant Second-hand and only 3 months himself 01/05/22  Vaping Use   Vaping Use: Never used  Substance Use Topics   Alcohol use: Not Currently     Alcohol/week: 0.0 standard drinks of alcohol    Comment: rare   Drug use: No    Comment: Reported history of cocaine abuse off and on        OPHTHALMIC EXAM:  Base Eye Exam     Visual Acuity (Snellen - Linear)       Right Left   Dist [place] 20/30 -2 20/30 +1   Dist ph [place] 20/25 -1 20/25 +1         Tonometry (Tonopen, 2:17 PM)       Right Left   Pressure 12 12         Pupils       Dark Light Shape React APD   Right 3 2 Round Brisk None   Left 3 2 Round Brisk None         Visual Fields (Counting fingers)       Left Right    Full    Restrictions  Total superior nasal deficiency         Extraocular Movement       Right Left    Full, Ortho Full, Ortho         Neuro/Psych     Oriented x3: Yes   Mood/Affect: Normal         Dilation     Left eye: 1.0% Mydriacyl, 2.5% Phenylephrine @ 2:17 PM           Slit Lamp and Fundus Exam     Slit Lamp Exam       Right Left   Lids/Lashes Dermatochalasis - upper lid, mild Meibomian gland dysfunction Dermatochalasis - upper lid, mild Meibomian gland dysfunction   Conjunctiva/Sclera White and quiet White and quiet   Cornea Mild arcus, well healed temporal cataract wounds, mild tear film debris, 1+ Punctate epithelial erosions well healed temporal cataract wounds, mild Arcus, 2+ inferior Punctate epithelial erosions, mild tear film debris   Anterior Chamber Deep and quiet No cell or flare   Iris Round and dilated Round and dilated   Lens PC IOL in good position, trace Posterior capsular opacification PC IOL in good position with open PC   Anterior Vitreous Vitreous syneresis Vitreous syneresis, PVD, no heme  Fundus Exam       Right Left   Disc Inferior Pallor, vascular loops, Sharp rim Pink and Sharp, mild temporal Pallor   C/D Ratio 0.3 0.4   Macula Flat, Blunted foveal reflex, mild Retinal pigment epithelial mottling, inferior atrophy Flat, good foveal reflex, +drusen, focal RPE disruption IN  fovea, no heme or edema   Vessels Severe Vascular attenuation, superior hemisphere with telangectasias and perivascular exudation / sheathing -- stable, Copper wiring, plaques in ST arterioles, Tortuous, no NV, BRAO attenuated, AV crossing changes, mild tortuosity   Periphery Attached, no heme Attached, inferior lattice, peripheral cystoid degeneration, No RT/RD           IMAGING AND PROCEDURES  Imaging and Procedures for $RemoveBefore'@[email or web address]'[email or web address]$ @  OCT, Retina - OU - Both Eyes       Right Eye Quality was good. Central Foveal Thickness: 227. Progression has been stable. Findings include no IRF, no SRF, abnormal foveal contour (Severe inner retinal atrophy inferior macula - stable).   Left Eye Quality was good. Central Foveal Thickness: 235. Progression has been stable. Findings include normal foveal contour, no IRF, no SRF, retinal drusen (Focal ellipsoid disruption inferonasal fovea).   Notes *Images captured and stored on drive  Diagnosis / Impression:  No IRF/SRF OU OD: BRAO w/ severe inner retinal atrophy inferior macula - stable OS: Focal ellipsoid disruption inferonasal fovea  Clinical management:  See below  Abbreviations: NFP - Normal foveal profile. CME - cystoid macular edema. PED - pigment epithelial detachment. IRF - intraretinal fluid. SRF - subretinal fluid. EZ - ellipsoid zone. ERM - epiretinal membrane. ORA - outer retinal atrophy. ORT - outer retinal tubulation. SRHM - subretinal hyper-reflective material             ASSESSMENT/PLAN:    ICD-10-CM   1. Retinal artery branch occlusion of right eye  H34.231 OCT, Retina - OU - Both Eyes    2. Posterior vitreous detachment of left eye  H43.812     3. Vitreous hemorrhage of left eye (HCC)  H43.12     4. Essential hypertension  I10     5. Hypertensive retinopathy of both eyes  H35.033     6. Pseudophakia of both eyes  Z96.1     7. PCO (posterior capsular opacification), left  H26.492       1. 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 History of hemorrhagic PVD OS  - Onset late March 2021 -- presented to Dr. Jerilynn Mages. Le per pt history  - Symptoms resolved, VH cleared  - Discussed findings and prognosis  - No RT or RD on 360 scleral depressed exam  - Reviewed s/s of RT/RD  - strict return precautions for any such RT/RD symptoms  4,5. Hypertensive retinopathy OU  - discussed importance of tight BP control  - monitor  6. Pseudophakia OU  - s/p CE/IOL OU  - beautiful surgeries, doing well  - monitor  7. PCO OU  - s/p YAG capsulotomy OS (09.13.23)  - BCVA OS improved from 20/30 to 20/25 - completed Lotemax QID OS x7 days - monitor  Ophthalmic Meds Ordered this visit:  No orders of the defined types were placed in this encounter.    Return in about 1 year (around 02/01/2023) for f/u BRAO OD, DFE, OCT.  There are no Patient Instructions on file for this  visit.  This document serves as a record of services personally performed by Gardiner Sleeper, MD, PhD. It was created on their behalf by Roselee Nova, COMT. The creation of this record is the provider's dictation and/or activities during the visit.  Electronically signed by: Roselee Nova, COMT 02/01/22 1:29 AM  This document serves as a record of services personally performed by Gardiner Sleeper, MD, PhD. It was created on their behalf by San Jetty. Owens Shark, OA an ophthalmic technician. The creation of this record is the provider's dictation and/or activities during the visit.    Electronically signed by: San Jetty. [place], [place] 10.02.2023 1:29 AM   Gardiner Sleeper, M.D., Ph.D. Diseases & Surgery of the Retina and Vitreous [facility]  I have reviewed the above documentation for accuracy and completeness, and I agree with the above. Gardiner Sleeper, M.D., Ph.D. 02/01/22 1:31 AM   Abbreviations: M myopia (nearsighted); A  astigmatism; H hyperopia (farsighted); P presbyopia; Mrx spectacle prescription;  CTL contact lenses; OD right eye; OS left eye; OU both eyes  XT exotropia; ET esotropia; PEK punctate epithelial keratitis; PEE punctate epithelial erosions; DES dry eye syndrome; MGD meibomian gland dysfunction; ATs artificial tears; PFAT's preservative free artificial tears; [place] nuclear sclerotic cataract; PSC posterior subcapsular cataract; ERM epi-retinal membrane; PVD posterior vitreous detachment; RD retinal detachment; DM diabetes mellitus; DR diabetic retinopathy; NPDR non-proliferative diabetic retinopathy; PDR proliferative diabetic retinopathy; CSME clinically significant macular edema; DME diabetic macular edema; dbh dot blot hemorrhages; CWS cotton wool spot; POAG primary open angle glaucoma; C/D cup-to-disc ratio; HVF humphrey visual field; GVF goldmann visual field; OCT optical coherence tomography; IOP intraocular pressure; BRVO Branch retinal vein occlusion; CRVO central retinal vein occlusion; CRAO central retinal artery occlusion; BRAO branch retinal artery occlusion; RT retinal tear; SB scleral buckle; PPV pars plana vitrectomy; VH Vitreous hemorrhage; PRP panretinal laser photocoagulation; IVK intravitreal kenalog; VMT vitreomacular traction; MH Macular hole;  NVD neovascularization of the disc; NVE neovascularization elsewhere; AREDS age related eye disease study; ARMD age related macular degeneration; POAG primary open angle glaucoma; EBMD epithelial/anterior basement membrane dystrophy; ACIOL anterior chamber intraocular lens; IOL intraocular lens; PCIOL posterior chamber intraocular lens; Phaco/IOL phacoemulsification with intraocular lens placement; [place] photorefractive keratectomy; LASIK laser assisted in situ keratomileusis; HTN hypertension; DM diabetes mellitus; COPD chronic obstructive pulmonary disease

## 2022-01-30 DIAGNOSIS — J9601 Acute respiratory failure with hypoxia: Secondary | ICD-10-CM | POA: Diagnosis not present

## 2022-01-30 DIAGNOSIS — J690 Pneumonitis due to inhalation of food and vomit: Secondary | ICD-10-CM | POA: Diagnosis not present

## 2022-01-30 DIAGNOSIS — I5020 Unspecified systolic (congestive) heart failure: Secondary | ICD-10-CM | POA: Diagnosis not present

## 2022-01-31 ENCOUNTER — Ambulatory Visit (INDEPENDENT_AMBULATORY_CARE_PROVIDER_SITE_OTHER): Payer: Medicaid Other | Admitting: Ophthalmology

## 2022-01-31 ENCOUNTER — Encounter (INDEPENDENT_AMBULATORY_CARE_PROVIDER_SITE_OTHER): Payer: Self-pay | Admitting: Ophthalmology

## 2022-01-31 DIAGNOSIS — H35033 Hypertensive retinopathy, bilateral: Secondary | ICD-10-CM | POA: Diagnosis not present

## 2022-01-31 DIAGNOSIS — I10 Essential (primary) hypertension: Secondary | ICD-10-CM

## 2022-01-31 DIAGNOSIS — H43812 Vitreous degeneration, left eye: Secondary | ICD-10-CM | POA: Diagnosis not present

## 2022-01-31 DIAGNOSIS — H34231 Retinal artery branch occlusion, right eye: Secondary | ICD-10-CM | POA: Diagnosis not present

## 2022-01-31 DIAGNOSIS — H4312 Vitreous hemorrhage, left eye: Secondary | ICD-10-CM

## 2022-01-31 DIAGNOSIS — Z961 Presence of intraocular lens: Secondary | ICD-10-CM

## 2022-01-31 DIAGNOSIS — H26492 Other secondary cataract, left eye: Secondary | ICD-10-CM

## 2022-01-31 NOTE — Telephone Encounter (Signed)
Called and spoke with patient. I reminded him that he is due for routine [place] and blood work at this time. Pt has been advised that he can stop by the lab in the basement at his earliest convenience for labs. I told pt that I can provide him with the phone number to radiology scheduling to set up his [place] appt. Pt stated that he did not have anything to write with and asked that I call him back and leave the contact information on his vm. Pt has also been scheduled for a follow up appt with Dr. Havery Moros on Friday, 04/08/22 at 10:30 am. Pt verbalized understanding and had no concerns at the end of the call.  Called patient back and left him a detailed vm with his appt information, instructions for labs, and I provided him with the phone # to radiology scheduling so that he can set up his [place] appt. I told pt to call back if he had any questions or concerns.

## 2022-02-01 ENCOUNTER — Encounter (INDEPENDENT_AMBULATORY_CARE_PROVIDER_SITE_OTHER): Payer: Self-pay | Admitting: Ophthalmology

## 2022-02-04 ENCOUNTER — Other Ambulatory Visit (INDEPENDENT_AMBULATORY_CARE_PROVIDER_SITE_OTHER): Payer: Medicaid Other

## 2022-02-04 DIAGNOSIS — K7460 Unspecified cirrhosis of liver: Secondary | ICD-10-CM | POA: Diagnosis not present

## 2022-02-04 DIAGNOSIS — B181 Chronic viral hepatitis B without delta-agent: Secondary | ICD-10-CM | POA: Diagnosis not present

## 2022-02-04 LAB — CBC WITH DIFFERENTIAL/PLATELET
Basophils Absolute: 0.1 10*3/uL (ref 0.0–0.1)
Basophils Relative: 0.9 % (ref 0.0–3.0)
Eosinophils Absolute: 0.3 10*3/uL (ref 0.0–0.7)
Eosinophils Relative: 3.9 % (ref 0.0–5.0)
HCT: 46.5 % (ref 39.0–52.0)
Hemoglobin: 15.7 g/dL (ref 13.0–17.0)
Lymphocytes Relative: 16.4 % (ref 12.0–46.0)
Lymphs Abs: 1.4 10*3/uL (ref 0.7–4.0)
MCHC: 33.7 g/dL (ref 30.0–36.0)
MCV: 92.7 fl (ref 78.0–100.0)
Monocytes Absolute: 0.5 10*3/uL (ref 0.1–1.0)
Monocytes Relative: 6.1 % (ref 3.0–12.0)
Neutro Abs: 6 10*3/uL (ref 1.4–7.7)
Neutrophils Relative %: 72.7 % (ref 43.0–77.0)
Platelets: 182 10*3/uL (ref 150.0–400.0)
RBC: 5.01 Mil/uL (ref 4.22–5.81)
RDW: 14.8 % (ref 11.5–15.5)
WBC: 8.3 10*3/uL (ref 4.0–10.5)

## 2022-02-04 LAB — COMPREHENSIVE METABOLIC PANEL
ALT: 60 U/L — ABNORMAL HIGH (ref 0–53)
AST: 16 U/L (ref 0–37)
Albumin: 4 g/dL (ref 3.5–5.2)
Alkaline Phosphatase: 62 U/L (ref 39–117)
BUN: 34 mg/dL — ABNORMAL HIGH (ref 6–23)
CO2: 24 mEq/L (ref 19–32)
Calcium: 8.9 mg/dL (ref 8.4–10.5)
Chloride: 104 mEq/L (ref 96–112)
Creatinine, Ser: 2.47 mg/dL — ABNORMAL HIGH (ref 0.40–1.50)
GFR: 27.86 mL/min — ABNORMAL LOW (ref >60.00)
Glucose, Bld: 140 mg/dL — ABNORMAL HIGH (ref 70–99)
Potassium: 3.7 mEq/L (ref 3.5–5.1)
Sodium: 137 mEq/L (ref 135–145)
Total Bilirubin: 0.5 mg/dL (ref 0.2–1.2)
Total Protein: 6.9 g/dL (ref 6.0–8.3)

## 2022-02-04 LAB — PROTIME-INR
INR: 1.1 ratio — ABNORMAL HIGH (ref 0.8–1.0)
Prothrombin Time: 12.5 s (ref 9.6–13.1)

## 2022-02-07 LAB — HEPATITIS B DNA, ULTRAQUANTITATIVE, PCR
Hepatitis B DNA: NOT DETECTED IU/mL
Hepatitis B virus DNA: NOT DETECTED Log IU/mL

## 2022-02-07 LAB — AFP TUMOR MARKER: AFP-Tumor Marker: 2 ng/mL (ref <6.1)

## 2022-02-14 ENCOUNTER — Other Ambulatory Visit: Payer: Self-pay | Admitting: Cardiovascular Disease

## 2022-02-14 DIAGNOSIS — I10 Essential (primary) hypertension: Secondary | ICD-10-CM

## 2022-02-15 ENCOUNTER — Other Ambulatory Visit: Payer: Self-pay | Admitting: Cardiovascular Disease

## 2022-02-17 ENCOUNTER — Telehealth: Payer: Self-pay | Admitting: *Deleted

## 2022-02-17 NOTE — Telephone Encounter (Signed)
Pt returned my call. Rescheduled ablation to 06/29/22. Pt scheduled to see Dr. Curt Bears on 06/02/22.  Aware we will go over all instructions at that [place]. Patient verbalized understanding and agreeable to plan.

## 2022-02-17 NOTE — Progress Notes (Signed)
Chief Complaint  Patient presents with   Follow-up    Pt in room #2 and alone. Pt here today for f/u on OSA on CPAP.    HISTORY OF PRESENT ILLNESS:  02/21/22 ALL:  Cody Ortega returns for follow up for OSA on CPAP. He was last seen 07/2021 and felt he was not getting enough pressure. AHI was 5.5/h. We increased set pressure to 11cmH20. Since, he has continued to struggle with compliance. He has multiple reasons for this. He has traveled and reports machine was not working. He tells me his oxygen concentrator "blew up" last night. He has congestion that prevents usage. No specific problem that can be addressed outside of consistent usage.      08/19/2021 ALL: Cody Ortega returns for follow up for OSA on CPAP. He was last seen 04/2021. Compliance was sub optimal. We sent him for a mask refitting and encouraged consistent use. Since, he reports using it every night. Sometimes mask slips at night but he is comfortable. He is using a nasal mask. He does report feeling that he doesn't get enough pressure. He reports that his nasal prongs feel that they are collapsing. He  does note some improvement in sleep quality. He feels less sleepy during the day.   Compliance report dated 07/20/2021-4/19-2023 reveals he used CPAP 29/30 days for compliance of 97%. He used CPAP greater than 4 hours 11/30 days for compliance of 37%. Average usage was 4 hours. Residual AHI was 5.5 on 10cmH20. There was as leak noted in 95th% of 38.5l/min.   04/08/2021 ALL:  Cody Ortega is a 61 y.o. male here today for follow up for OSA on CPAP and headaches. He was seen in consult with Dr Rexene Alberts 08/2020 for recurrent tension style headaches. CT head was unremarkable. PSG 11/26/2020 showed severe OSA with AHI 59.1/hr and O2 nadir 75%. He ws started on CPAP therapy. He does states that he is using CPAP consistently. He feels he uses for at least 4 hours every day but reports that his sleep schedule is all off. He does not go to bed at the  same time each night and may take several naps during the day. He is on disability for CHF and not able to work. He did not bring CPAP to review data. He reports feeling "like an 62 year old" for two days right after starting therapy but not sure he has continues to note benefit.   Addendum:    HISTORY (copied from Dr Guadelupe Sabin previous note)  Dear Dr. Marin Comment,    I saw your patient, Cody Ortega, upon your kind request in my neurologic clinic today for initial consultation of his recurrent headaches including nocturnal headaches.  The patient is unaccompanied today.  As you know, Cody Ortega is a 61 year old right-handed man with an underlying complex medical history of coronary artery disease, cardiomyopathy, chronic systolic congestive heart failure, atrial fibrillation, status post AICD implant in 2012 with generator change out in September 2021, chronic kidney disease, history of hepatitis B, history of substance use, history of liver cirrhosis anxiety, arthritis, asthma, anemia, stroke, thyroid disease, hypertension, hyperlipidemia, sleep apnea, not on CPAP therapy, and obesity, who reports recurrent headaches in the past few months.  He does not have a prior history of headaches or migraines.  His headaches are not debilitating and not one-sided or throbbing, no associated nausea or vomiting but he does have some light sensitivity.  He feels that his headaches got worse as the day progresses and  are made worse by stress.  He tries to hydrate well with water but drinks quite a bit of caffeine, at least 4 cans of soda and about 1 serving of tea daily, typically no coffee.  He tries to drink about 3-4 bottles of water per day.  He does not drink any alcohol.  He reports a family history of alcoholism and drug addiction.  He has a remote history of cocaine use.  He is followed by psychiatry for his anxiety and depression and reports fluctuating mood related symptoms, some increase in depressive symptoms from  time to time.  He is followed closely by psychiatry and sees Noemi Chapel, NP.  I reviewed your office note from 09/03/2020.  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  He is followed by ophthalmology/retina specialist and cardiology.  He is on numerous medications. He had a head CT without contrast as well as cervical spine CT without contrast on 07/19/2010 and I was able to review the results:  IMPRESSION:  No acute bony abnormality of the cervical spine.    Cannot exclude a disc bulge at C4-5.  Appearances are similar  compared to prior study of 2006.    He had a sleep study several years ago, he is not sure how severe his sleep apnea was but he tried CPAP and could not tolerate it.  He would be willing to try a nasal interface but reports that he would not be able to use a facemask.  He is trying to lose weight.  He is trying to stay active.  He lives with his girlfriend.   He had prescription eyeglasses some years ago but these caused headaches.  His current headaches are intermittent, not long-lasting, are not associated with any neurological accompaniments.  He has a history of stroke some 6 years ago.    He reports a fall last week but did not injure himself.   He does not sleep well and does not keep a very set schedule, he may be in bed anywhere between 3 AM and 4:30 AM, rise time can vary between 1:30 PM to 5 PM.  Epworth sleepiness score is 7 out of 24, fatigue severity score is 46 out of 63.   He reports chronic low back pain and herniated disc in the lower back in the 90s.   REVIEW OF SYSTEMS: Out of a complete 14 system review of symptoms, the patient complains only of the following symptoms, fatigue, shob, anxiety all other reviewed systems are negative.  ESS: 9, previously 10   ALLERGIES: Allergies  Allergen Reactions   Prochlorperazine Other (See Comments)     Medication caused a lot of issues like blurred vision, nausea, pain, and carpool turnel   Codeine Itching, Nausea And Vomiting and Hives   Hydrocodone-Acetaminophen Itching and Nausea And Vomiting     HOME MEDICATIONS: Outpatient Medications Prior to Visit  Medication Sig Dispense Refill   acetaminophen (TYLENOL) 500 MG tablet Take 1,000 mg by mouth every 6 (six) hours as needed for moderate pain or headache.     albuterol (PROVENTIL) (2.5 MG/3ML) 0.083% nebulizer solution Take 3 mLs (2.5 mg total) by nebulization every 6 (six) hours as needed for wheezing or shortness of breath. 150 mL 6   albuterol (VENTOLIN HFA) 108 (90 Base) MCG/ACT inhaler Inhale 2 puffs into the lungs every 6 (six) hours as needed for wheezing or shortness of  breath. 8.5 g 6   alprazolam (XANAX) 2 MG tablet Take 2 mg by mouth every 6 (six) hours as needed for anxiety.     amiodarone (PACERONE) 200 MG tablet Take 1 tablet (200 mg total) by mouth daily. 90 tablet 2   atorvastatin (LIPITOR) 10 MG tablet TAKE 1 TABLET(10 MG) BY MOUTH DAILY 90 tablet 2   carvedilol (COREG) 12.5 MG tablet Take 1.5 tabs PO twice a day PO 270 tablet 3   dapagliflozin propanediol (FARXIGA) 10 MG TABS tablet Take 1 tablet (10 mg total) by mouth daily before breakfast. 30 tablet 6   DULoxetine (CYMBALTA) 30 MG capsule Take 90 mg by mouth daily.      entecavir (BARACLUDE) 0.5 MG tablet Take 1 tablet (0.5 mg total) by mouth every other day. 45 tablet 1   ENTRESTO 49-51 MG TAKE 1 TABLET BY MOUTH TWICE DAILY 180 tablet 1   ENTRESTO 49-51 MG TAKE 1 TABLET BY MOUTH TWICE DAILY 180 tablet 3   ezetimibe (ZETIA) 10 MG tablet TAKE 1 TABLET(10 MG) BY MOUTH DAILY 90 tablet 2   ferrous sulfate 325 (65 FE) MG tablet Take 325 mg by mouth daily with breakfast.     fluticasone (FLONASE) 50 MCG/ACT nasal spray SHAKE LIQUID AND USE 1 SPRAY IN EACH NOSTRIL DAILY 16 g 4   furosemide (LASIX) 40 MG tablet TAKE 1/2 TABLET(20 MG) BY MOUTH DAILY 45 tablet 3   hydrALAZINE  (APRESOLINE) 100 MG tablet Take 1 tablet (100 mg total) by mouth in the morning and at bedtime. (Patient taking differently: Take 100 mg by mouth 3 (three) times daily.) 180 tablet 1   ondansetron (ZOFRAN-ODT) 8 MG disintegrating tablet Take 8 mg by mouth 2 (two) times daily as needed for nausea or vomiting.     oxybutynin (DITROPAN) 5 MG tablet TAKE 1 TABLET(5 MG) BY MOUTH THREE TIMES DAILY 270 tablet 1   polyethylene glycol (MIRALAX) 17 g packet Take as directed, Daily, titrate as needed 14 each 0   RESTASIS 0.05 % ophthalmic emulsion 1 drop 2 (two) times daily.     sildenafil (REVATIO) 20 MG tablet Take 1 tablet (20 mg total) by mouth as needed. 90 tablet 0   SYMBICORT 160-4.5 MCG/ACT inhaler INHALE 2 PUFFS INTO THE LUNGS TWICE DAILY 10.2 g 2   tamsulosin (FLOMAX) 0.4 MG CAPS capsule TAKE 1 CAPSULE(0.4 MG) BY MOUTH DAILY 90 capsule 1   XARELTO 20 MG TABS tablet TAKE 1 TABLET(20 MG) BY MOUTH DAILY WITH SUPPER 90 tablet 1   No facility-administered medications prior to visit.     PAST MEDICAL HISTORY: Past Medical History:  Diagnosis Date   Anxiety    Arthritis    Benzodiazepine dependence ([place])    Bronchial asthma    CAD (coronary artery disease)    a. Cath 03/2010: mod RCA stenosis, severe diag stenosis, treated medically given lack of angina.   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 Episode in 2008 with spontaneous conversion, on amiodarone per EP.   S/P implantation of automatic cardioverter/defibrillator (AICD)    a.  [employer]tr[employer], implanted 2012.   Sleep apnea    Stroke [facility]) 2004   Thyroid disease      PAST SURGICAL HISTORY: Past Surgical History:  Procedure Laterality Date   CARDIOVERSION N/A 01/07/2014   Procedure: CARDIOVERSION;  Surgeon: Lewayne Bunting, MD;  Location: [facility] ENDOSCOPY;  Service: Cardiovascular;  Laterality: N/A;   CARDIOVERSION N/A 02/11/2014   Procedure: CARDIOVERSION;  Surgeon: Chrystie Nose, MD;  Location: [facility] ENDOSCOPY;  Service: Cardiovascular;  Laterality: N/A;   CARDIOVERSION N/A 10/01/2020   Procedure: CARDIOVERSION;  Surgeon: Christell Constant, MD;  Location: [facility] ENDOSCOPY;  Service: Cardiovascular;  Laterality: N/A;   CATARACT EXTRACTION     COLONOSCOPY WITH PROPOFOL N/A 02/06/2018   Procedure: COLONOSCOPY WITH PROPOFOL;  Surgeon: Benancio Deeds, MD;  Location: [facility] ENDOSCOPY;  Service: Gastroenterology;  Laterality: N/A;   EYE SURGERY     ICD GENERATOR CHANGEOUT N/A 01/17/2020   Procedure: ICD GENERATOR CHANGEOUT;  Surgeon: Marinus Maw, MD;  Location: [facility] INVASIVE CV LAB;  Service: Cardiovascular;  Laterality: N/A;   LASEK eye surgery Bilateral 05/2018   LASIK Bilateral    PACEMAKER INSERTION     POLYPECTOMY  02/06/2018   Procedure: POLYPECTOMY;  Surgeon: Benancio Deeds, MD;  Location: [facility] ENDOSCOPY;  Service: Gastroenterology;;   TEE WITHOUT CARDIOVERSION N/A 01/07/2014   Procedure: TRANSESOPHAGEAL ECHOCARDIOGRAM (TEE);  Surgeon: Lewayne Bunting, MD;  Location: [facility] ENDOSCOPY;  Service: Cardiovascular;  Laterality: N/A;   TEE WITHOUT CARDIOVERSION N/A 02/11/2014   Procedure: TRANSESOPHAGEAL ECHOCARDIOGRAM (TEE);  Surgeon: Chrystie Nose, MD;  Location: [facility] ENDOSCOPY;  Service: Cardiovascular;  Laterality: N/A;   TEE WITHOUT CARDIOVERSION N/A 10/01/2020   Procedure: TRANSESOPHAGEAL ECHOCARDIOGRAM (TEE);  Surgeon: Christell Constant, MD;  Location: [facility] ENDOSCOPY;  Service: Cardiovascular;  Laterality: N/A;   TONSILLECTOMY     TRANSTHORACIC  ECHOCARDIOGRAM  10/2006, 12/2009     FAMILY HISTORY: Family History  Problem Relation Age of Onset   Breast cancer Mother    Multiple myeloma Mother    Prostate cancer Father    Diabetes Father    Peripheral vascular disease Father    Cerebral palsy Daughter    Lung disease Neg Hx    Colon cancer Neg Hx    Stomach cancer Neg Hx    Esophageal cancer Neg Hx    Colon polyps Neg Hx      SOCIAL HISTORY: Social History   Socioeconomic History   Marital status: Divorced    Spouse name: Not on file   Number of children: 2   Years of education: Not on file   Highest education level: Not on file  Occupational History   Occupation: Unemployed    Employer: UNEMPLOYED    Comment: Corporate treasurer in the past  Tobacco Use   Smoking status: Former    Packs/day: 0.50    Years: 0.50    Total pack years: 0.25    Types: Cigarettes    Quit date: 05/02/2010    Years since quitting: 11.8   Smokeless tobacco: Never   Tobacco comments:    Former smoker Significant Second-hand and only 3 months himself 01/05/22  Vaping Use   Vaping Use: Never used  Substance and Sexual Activity   Alcohol use: Not Currently    Alcohol/week: 0.0 standard drinks of alcohol    Comment: rare  Drug use: No    Comment: Reported history of cocaine abuse off and on    Sexual activity: Not on file  Other Topics Concern   Not on file  Social History Narrative   Living with a son who is a 39 year old.  He is not     working, unemployed for 3 years.  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  Denied use of alcohol.  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       [facility]:   Originally from [place], [place]. He grew up in [place]. He moved to [place] in 1985. 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 No pets currently. No bird exposure. He did have mold in a previous home. No hot tub exposure.         Social Determinants of Health    Financial Resource Strain: Not on file  Food Insecurity: Not on file  Transportation Needs: Not on file  Physical Activity: Not on file  Stress: Not on file  Social Connections: Not on file  Intimate Partner Violence: Not on file     PHYSICAL EXAM  Vitals:   02/21/22 1407  BP: (!) 163/93  Pulse: 72  Weight: 270 lb 8 oz (122.7 kg)  Height: $Remove'6\' 3"'DCevHsM$  (1.905 m)     Body mass index is 33.81 kg/m.  Generalized: Well developed, in no acute distress  Cardiology: normal rate and rhythm, no murmur auscultated  Respiratory: clear to auscultation bilaterally    Neurological examination  Mentation: Alert oriented to time, place, history taking. Follows all commands speech and language fluent Cranial nerve II-XII: Pupils were equal round reactive to light. Extraocular movements were full, visual field were full on confrontational test. Facial sensation and strength were normal. Head turning and shoulder shrug  were normal and symmetric. Motor: The motor testing reveals 5 over 5 strength of all 4 extremities. Good symmetric motor tone is noted throughout.  Gait and station: Gait is normal.    DIAGNOSTIC DATA (LABS, IMAGING, TESTING) - I reviewed patient records, labs, notes, testing and imaging myself where available.  Lab Results  Component Value Date   WBC 8.3 02/04/2022   HGB 15.7 02/04/2022   HCT 46.5 02/04/2022   MCV 92.7 02/04/2022   PLT 182.0 02/04/2022      Component Value Date/Time   NA 137 02/04/2022 1403   NA 139 12/01/2021 1605   K 3.7 02/04/2022 1403   CL 104 02/04/2022 1403   CO2 24 02/04/2022 1403   GLUCOSE 140 (H) 02/04/2022 1403   BUN 34 (H) 02/04/2022 1403   BUN 31 (H) 12/01/2021 1605   CREATININE 2.47 (H) 02/04/2022 1403   CREATININE 1.38 (H) 02/08/2016 1155   CALCIUM 8.9 02/04/2022 1403   PROT 6.9 02/04/2022 1403   PROT 6.9 12/09/2020 1458   ALBUMIN 4.0 02/04/2022 1403   ALBUMIN 4.2 12/09/2020 1458   AST 16 02/04/2022 1403   ALT 60 (H) 02/04/2022  1403   ALKPHOS 62 02/04/2022 1403   BILITOT 0.5 02/04/2022 1403   BILITOT 0.4 12/09/2020 1458   GFRNONAA 28 (L) 10/29/2021 2317   GFRAA 36 (L) 06/11/2020 1529   Lab Results  Component Value Date   CHOL 196 05/08/2020   HDL 33 (L) 05/08/2020   LDLCALC 138 (H) 05/08/2020   LDLDIRECT 102 (H) 09/15/2020   TRIG 135 05/08/2020   CHOLHDL 5.9 (H) 05/08/2020  Lab Results  Component Value Date   HGBA1C 5.6 12/09/2020   Lab Results  Component Value Date   DPTELMRA15 183 07/09/2018   Lab Results  Component Value Date   TSH 2.390 09/15/2020        No data to display               No data to display           ASSESSMENT AND PLAN  61 y.o. 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 here with    OSA on CPAP  Compliance report shows 30% daily compliance with 4% four hour compliance over the past 90 days.There are multiple reasons he gives for not using machine but none are situations that I can help him with. He was encouraged to use CPAP consistently. He was encouraged to use CPAP nightly for at least 4 hours each night. He may continue Flonase for congestion. Consider discussion with PCP if congestion continues to be a problem. He will follow up with DME for concerns with oxygen concentrator or for any concerns with machine not operating. We will follow up with me  once he resumes consistent use. He verbalizes understanding and agreement with this plan.     No orders of the defined types were placed in this encounter.     No orders of the defined types were placed in this encounter.      Debbora Presto, MSN, FNP-C 02/21/2022, 2:35 PM  [facility] [address], [place], [place] [address] [phone]

## 2022-02-17 NOTE — Patient Instructions (Addendum)
Please continue using your CPAP regularly. While your insurance requires that you use CPAP at least 4 hours each night on 70% of the nights, I recommend, that you not skip any nights and use it throughout the night if you can. Getting used to CPAP and staying with the treatment long term does take time and patience and discipline. Untreated obstructive sleep apnea when it is moderate to severe can have an adverse impact on cardiovascular health and raise her risk for heart disease, arrhythmias, hypertension, congestive heart failure, stroke and diabetes. Untreated obstructive sleep apnea causes sleep disruption, nonrestorative sleep, and sleep deprivation. This can have an impact on your day to day functioning and cause daytime sleepiness and impairment of cognitive function, memory loss, mood disturbance, and problems focussing. Using CPAP regularly can improve these symptoms.  Call me if you resume consistent use of CPAP. I would recommend you use it every night. You can continue Flonase.

## 2022-02-21 ENCOUNTER — Encounter: Payer: Self-pay | Admitting: Family Medicine

## 2022-02-21 ENCOUNTER — Ambulatory Visit: Payer: Medicaid Other | Admitting: Family Medicine

## 2022-02-21 VITALS — BP 163/93 | HR 72 | Ht 75.0 in | Wt 270.5 lb

## 2022-02-21 DIAGNOSIS — G4733 Obstructive sleep apnea (adult) (pediatric): Secondary | ICD-10-CM

## 2022-03-01 ENCOUNTER — Telehealth: Payer: Self-pay | Admitting: Internal Medicine

## 2022-03-01 NOTE — Telephone Encounter (Signed)
Dr. Wynetta Emery ordered the pt a CPAP machine and when he received the machine they also gave him a oxygen concentrator / the concentrator has blown up and spilled out stuff everywhere / he called [employer] (the number on the equipment) and they advised him that he didn't exist to them (they couldn't find him) / pt wants to know what to do with the equipment and who to call / or if Dr. Wynetta Emery can call the company / please advise asap/ pt needs another oxygen concentrator asap

## 2022-03-01 NOTE — Telephone Encounter (Signed)
Can you follow up with patient

## 2022-03-02 DIAGNOSIS — J9601 Acute respiratory failure with hypoxia: Secondary | ICD-10-CM | POA: Diagnosis not present

## 2022-03-02 DIAGNOSIS — J690 Pneumonitis due to inhalation of food and vomit: Secondary | ICD-10-CM | POA: Diagnosis not present

## 2022-03-02 DIAGNOSIS — I5020 Unspecified systolic (congestive) heart failure: Secondary | ICD-10-CM | POA: Diagnosis not present

## 2022-03-02 NOTE — Telephone Encounter (Signed)
I spoke to [facility] and she confirmed that they have no record of the patient after trying to look him up multiple times.  I called the patient and he explained how his O2 concentrator " blew up" sending sand like particles into the air. He has a room concentrator, small tanks for self fill as well as a large O2 tank.  He was explained that when he first called [facility], they were ready to send a technician out that night and he told them not to worry about it at night. Then when he called them back, they told him that they have had no record of him.  The labels on his O2 equipment state [place].  Per his hospital discharge information on 10/07/2020, his O2 was ordered through [facility].    He said there is no problem with the CPAP machine and that is from [employer].   I explained to him that I would send a message to our Account Exec at  [facility] requesting guidance.  Email then sent to [facility] requesting assistance for patient.

## 2022-03-02 NOTE — Progress Notes (Signed)
No ICM remote transmission received for 02/28/2022 and next ICM transmission scheduled for 04/04/2022.

## 2022-03-03 NOTE — Telephone Encounter (Signed)
Message received from [facility] stating she would need to look into this situation.   I spoke to [facility] this afternoon and she said that she now sees in their system that the patient has been added and they have scheduled for a technician to drive by patient's home tomorrow- 03/04/2022 to verify if the O2 is from [employer].  I called patient to confirm that he is aware of the visit from [place], message left with call back requested.

## 2022-03-07 ENCOUNTER — Ambulatory Visit (INDEPENDENT_AMBULATORY_CARE_PROVIDER_SITE_OTHER): Payer: Medicaid Other

## 2022-03-07 DIAGNOSIS — I495 Sick sinus syndrome: Secondary | ICD-10-CM

## 2022-03-08 LAB — CUP PACEART REMOTE DEVICE CHECK
Battery Remaining Longevity: 124 mo
Battery Voltage: 3 V
Brady Statistic RV Percent Paced: 0.45 %
Date Time Interrogation Session: 20231105043259
HighPow Impedance: 47 Ohm
HighPow Impedance: 60 Ohm
Implantable Lead Connection Status: 753985
Implantable Lead Implant Date: 20120806
Implantable Lead Location: 753860
Implantable Lead Model: 185
Implantable Lead Serial Number: 345870
Implantable Pulse Generator Implant Date: 20210917
Lead Channel Impedance Value: 323 Ohm
Lead Channel Impedance Value: 323 Ohm
Lead Channel Pacing Threshold Amplitude: 1.25 V
Lead Channel Pacing Threshold Pulse Width: 0.4 ms
Lead Channel Sensing Intrinsic Amplitude: 6.5 mV
Lead Channel Sensing Intrinsic Amplitude: 6.5 mV
Lead Channel Setting Pacing Amplitude: 2.75 V
Lead Channel Setting Pacing Pulse Width: 0.4 ms
Lead Channel Setting Sensing Sensitivity: 0.3 mV
Zone Setting Status: 755011
Zone Setting Status: 755011

## 2022-03-09 ENCOUNTER — Ambulatory Visit: Payer: Medicaid Other | Admitting: Cardiology

## 2022-03-10 NOTE — Telephone Encounter (Signed)
I spoke to [place] who explained  that a technician made home visit and confirmed that the O2 equipment was provided by [facility] and they switched out his equipment with a new 10L home fill concentrator.

## 2022-03-10 NOTE — Telephone Encounter (Signed)
I called patient to confirm that he has his new O2 equipment and he is not having any problems with it. Message left with call back requested.

## 2022-03-12 ENCOUNTER — Other Ambulatory Visit: Payer: Self-pay | Admitting: Cardiology

## 2022-03-12 DIAGNOSIS — I480 Paroxysmal atrial fibrillation: Secondary | ICD-10-CM

## 2022-03-14 ENCOUNTER — Ambulatory Visit (HOSPITAL_COMMUNITY): Admission: RE | Admit: 2022-03-14 | Payer: Medicaid Other | Source: Ambulatory Visit

## 2022-03-14 NOTE — Telephone Encounter (Signed)
Prescription refill request for Xarelto received.  Indication:afib Last office visit:8/23 Weight:122.7 kg Age:61 Scr:2.4 CrCl:57.52  ml/min  Prescription refilled

## 2022-03-14 NOTE — Telephone Encounter (Signed)
I tried to reach the patient again to confirm that he has his new O2 equipment and he is not having any problems with it. Message left with call back requested.

## 2022-03-14 NOTE — Telephone Encounter (Signed)
Call received from patient and he confirmed that he received the new O2 equipment from [facility] and he has their phone number to call if there are any issues with the equipment.

## 2022-03-15 ENCOUNTER — Ambulatory Visit (INDEPENDENT_AMBULATORY_CARE_PROVIDER_SITE_OTHER): Payer: Medicaid Other

## 2022-03-15 DIAGNOSIS — Z95810 Presence of automatic (implantable) cardiac defibrillator: Secondary | ICD-10-CM

## 2022-03-15 DIAGNOSIS — I5022 Chronic systolic (congestive) heart failure: Secondary | ICD-10-CM | POA: Diagnosis not present

## 2022-03-21 DIAGNOSIS — F410 Panic disorder [episodic paroxysmal anxiety] without agoraphobia: Secondary | ICD-10-CM | POA: Diagnosis not present

## 2022-03-21 DIAGNOSIS — F330 Major depressive disorder, recurrent, mild: Secondary | ICD-10-CM | POA: Diagnosis not present

## 2022-03-21 DIAGNOSIS — F639 Impulse disorder, unspecified: Secondary | ICD-10-CM | POA: Diagnosis not present

## 2022-03-23 NOTE — Progress Notes (Signed)
EPIC Encounter for ICM Monitoring  Patient Name: Cody Ortega is a 60 y.o. male Date: 03/23/2022 Primary Care Physican: Ladell Pier, MD Primary Cardiologist: Burt Knack Electrophysiologist: Lovena Le 08/05/2021 Office Weight: 274 lbs                                                             Transmission reviewed.      Optivol thoracic impedance suggesting possible fluid accumulation starting 10/20 but trending close to baseline normal 11/8.     Prescribed:  Furosemide 40 mg take 0.5 tablet (20 mg total) by mouth daily.   Labs: 02/04/2022 Creatinine 2.47, BUN 34, Potassium 3.7, Sodium 137, GFR 27.86 12/01/2021 Creatinine 2.45, BUN 31, Potassium 4.3, Sodium 139, GFR 30  11/14/2021 Creatinine 3.51, BUN 42, Potassium 3.7, Sodium 140, GFR 18 11/13/2021 Creatinine 3.73, BUN 41, Potassium 3.8, Sodium 137, GFR 17  10/29/2021 Creatinine 2.58, BUN 29, Potassium 4.2, Sodium 141, GFR 28  07/20/2021 Creatinine 2.33, BUN 33, Potassium 3.8, Sodium 137, GFR 30 A complete set of results can be found in Results Review.   Recommendations:  Left voice mail with ICM number and encouraged to call if experiencing any fluid symptoms.   Follow-up plan: ICM clinic phone appointment on 04/18/2022.   91 day device clinic remote transmission 05/14/2022.     EP/Cardiology Office Visits:  04/12/2022 with Christen Bame, NP.  06/02/2022 with Dr Curt Bears.   Copy of ICM check sent to Dr. Lovena Le.     3 month ICM trend: 03/11/2022.    12-14 Month ICM trend:     Rosalene Billings, RN 03/23/2022 5:48 AM

## 2022-04-01 DIAGNOSIS — J9601 Acute respiratory failure with hypoxia: Secondary | ICD-10-CM | POA: Diagnosis not present

## 2022-04-01 DIAGNOSIS — I5020 Unspecified systolic (congestive) heart failure: Secondary | ICD-10-CM | POA: Diagnosis not present

## 2022-04-01 DIAGNOSIS — J690 Pneumonitis due to inhalation of food and vomit: Secondary | ICD-10-CM | POA: Diagnosis not present

## 2022-04-05 NOTE — Progress Notes (Signed)
Remote ICD transmission.   

## 2022-04-08 ENCOUNTER — Other Ambulatory Visit: Payer: Self-pay | Admitting: Internal Medicine

## 2022-04-08 ENCOUNTER — Encounter: Payer: Self-pay | Admitting: Gastroenterology

## 2022-04-08 ENCOUNTER — Ambulatory Visit (INDEPENDENT_AMBULATORY_CARE_PROVIDER_SITE_OTHER): Payer: Medicaid Other | Admitting: Gastroenterology

## 2022-04-08 VITALS — BP 142/90 | HR 69 | Ht 75.0 in | Wt 271.0 lb

## 2022-04-08 DIAGNOSIS — K5909 Other constipation: Secondary | ICD-10-CM

## 2022-04-08 DIAGNOSIS — K7460 Unspecified cirrhosis of liver: Secondary | ICD-10-CM | POA: Diagnosis not present

## 2022-04-08 DIAGNOSIS — Z7901 Long term (current) use of anticoagulants: Secondary | ICD-10-CM

## 2022-04-08 DIAGNOSIS — K649 Unspecified hemorrhoids: Secondary | ICD-10-CM | POA: Diagnosis not present

## 2022-04-08 DIAGNOSIS — Z86010 Personal history of colonic polyps: Secondary | ICD-10-CM

## 2022-04-08 DIAGNOSIS — B181 Chronic viral hepatitis B without delta-agent: Secondary | ICD-10-CM | POA: Diagnosis not present

## 2022-04-08 MED ORDER — POLYETHYLENE GLYCOL 3350 17 G PO PACK: 17.0000 g | PACK | Freq: Two times a day (BID) | ORAL | 0 refills | Status: DC

## 2022-04-08 MED ORDER — CALMOL-4 76-10 % RE SUPP: RECTAL | 0 refills | Status: DC

## 2022-04-08 NOTE — Progress Notes (Signed)
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 Recall he has a complicated history with CHF (EF 30-35%), ICD in place, A. fib on Xarelto, history of stroke.      Diagnosed with hepatitis B in August 2019, high viral load with positive E antigen.  Liver imaging at the time was consistent with cirrhosis.  He was started on entecavir 0.5 mg every other day due to his CKD and has been on it since.  Over time his viral load has decreased and has since become undetectable and has remained so, last checked 02/04/22. With therapy he seroconverted in his hepatitis B E antigen is now negative. He is not drinking any alcohol.  In regards to his cirrhosis has been doing pretty well.  He denies any problems with edema or ascites.  No jaundice.  He has been maintained on Coreg for his heart issues so we have not pursued variceal screening.  He has never had encephalopathy.  He is overdue for an ultrasound, he was last imaged for [facility] screening with an ultrasound in March of this past year.  AFP in October was normal.  He has been vaccinated to hepatitis A  Since I last saw him there was a question of whether or not he had a stroke while traveling.  He is rather adamant that he took an accidental extra dose of his blood pressure medicine while traveling in [place] when it was more than 100 degrees and got quite dehydrated and that led to his symptoms, he does not think he had a true stroke.  He has been following with cardiology for A-fib, he is tentatively scheduled for an ablation with them on February 28.  This is in hopes of getting him off the of the amiodarone. He has a history of CHF, last EF 30 to 35%.     He is otherwise due for surveillance colonoscopy, his last exam was October 2019 with 7 polyps, repeat exam was recommended in 3 years.  This has not happened yet due to other medical issues.  He has chronic constipation for which she is taking MiraLAX twice daily.  Generally  he is pretty happy with how it works, he occasionally uses Dulcolax as needed for hard stool.  He has had some inflamed hemorrhoids recently due to the constipation.   Prior workup: Colonoscopy 02/06/2018 - 7 small polyps, diverticulosis, hemorrhoids - adenomas - repeat in 3 years     RUQ [place] 07/29/2021 -  IMPRESSION: 1. Cirrhosis. 2. 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 3. No ascites.    Past Medical History:  Diagnosis Date   Anxiety    Arthritis    Benzodiazepine dependence (HCC)    Bronchial asthma    CAD (coronary artery disease)    a. Cath 03/2010: mod RCA stenosis, severe diag stenosis, treated medically given lack of angina.   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 Episode in 2008 with spontaneous conversion, on amiodarone per EP.   S/P implantation of automatic cardioverter/defibrillator (AICD)    a. Medtronic, implanted 2012.   Sleep apnea    Stroke [facility]) 2004   Thyroid disease      Past Surgical History:  Procedure Laterality Date   CARDIOVERSION N/A 01/07/2014   Procedure: CARDIOVERSION;  Surgeon: Lelon Perla, MD;  Location: [facility] ENDOSCOPY;  Service: Cardiovascular;  Laterality: N/A;   CARDIOVERSION N/A 02/11/2014   Procedure: CARDIOVERSION;  Surgeon: Pixie Casino, MD;  Location: [facility];  Service: Cardiovascular;  Laterality: N/A;   CARDIOVERSION N/A 10/01/2020   Procedure: CARDIOVERSION;  Surgeon: Werner Lean, MD;  Location: [facility] ENDOSCOPY;  Service: Cardiovascular;   Laterality: N/A;   CATARACT EXTRACTION     COLONOSCOPY WITH PROPOFOL N/A 02/06/2018   Procedure: COLONOSCOPY WITH PROPOFOL;  Surgeon: Yetta Flock, MD;  Location: [facility] ENDOSCOPY;  Service: Gastroenterology;  Laterality: N/A;   EYE SURGERY     ICD GENERATOR CHANGEOUT N/A 01/17/2020   Procedure: ICD GENERATOR CHANGEOUT;  Surgeon: Evans Lance, MD;  Location: [place] CV LAB;  Service: Cardiovascular;  Laterality: N/A;   LASEK eye surgery Bilateral 05/2018   LASIK Bilateral    PACEMAKER INSERTION     POLYPECTOMY  02/06/2018   Procedure: POLYPECTOMY;  Surgeon: Yetta Flock, MD;  Location: [facility] ENDOSCOPY;  Service: Gastroenterology;;   TEE WITHOUT CARDIOVERSION N/A 01/07/2014   Procedure: TRANSESOPHAGEAL ECHOCARDIOGRAM (TEE);  Surgeon: Lelon Perla, MD;  Location: [facility] ENDOSCOPY;  Service: Cardiovascular;  Laterality: N/A;   TEE WITHOUT CARDIOVERSION N/A 02/11/2014   Procedure: TRANSESOPHAGEAL ECHOCARDIOGRAM (TEE);  Surgeon: Pixie Casino, MD;  Location: [facility] ENDOSCOPY;  Service: Cardiovascular;  Laterality: N/A;   TEE WITHOUT CARDIOVERSION N/A 10/01/2020   Procedure: TRANSESOPHAGEAL ECHOCARDIOGRAM (TEE);  Surgeon: Werner Lean, MD;  Location: [facility] ENDOSCOPY;  Service: Cardiovascular;  Laterality: N/A;   TONSILLECTOMY     TRANSTHORACIC ECHOCARDIOGRAM  10/2006, 12/2009   Family History  Problem Relation Age of Onset   Breast cancer Mother    Multiple myeloma Mother    Prostate cancer Father    Diabetes Father    Peripheral vascular disease Father    Cerebral palsy Daughter    Lung disease Neg Hx    Colon cancer Neg Hx    Stomach cancer Neg Hx    Esophageal cancer Neg Hx    Colon polyps Neg Hx    Social History   Tobacco Use   Smoking status: Former    Packs/day: 0.50    Years: 0.50    Total pack years: 0.25    Types: Cigarettes    Quit date: 05/02/2010    Years since quitting: 11.9   Smokeless tobacco: Never   Tobacco comments:    Former smoker  Significant Second-hand and only 3 months himself 01/05/22  Vaping Use   Vaping Use: Never used  Substance Use Topics   Alcohol use: Not Currently    Alcohol/week: 0.0 standard drinks of alcohol    Comment: rare   Drug use: No    Comment: Reported history of cocaine abuse off and on    Current Outpatient Medications  Medication Sig Dispense Refill   acetaminophen (TYLENOL) 500 MG tablet Take 1,000 mg by mouth every 6 (six) hours as needed for moderate pain or headache.     albuterol (PROVENTIL) (2.5 MG/3ML) 0.083% nebulizer solution Take 3 mLs (2.5 mg total) by nebulization every  6 (six) hours as needed for wheezing or shortness of breath. 150 mL 6   albuterol (VENTOLIN HFA) 108 (90 Base) MCG/ACT inhaler Inhale 2 puffs into the lungs every 6 (six) hours as needed for wheezing or shortness of breath. 8.5 g 6   alprazolam (XANAX) 2 MG tablet Take 2 mg by mouth every 6 (six) hours as needed for anxiety.     amiodarone (PACERONE) 200 MG tablet Take 1 tablet (200 mg total) by mouth daily. 90 tablet 2   atorvastatin (LIPITOR) 10 MG tablet TAKE 1 TABLET(10 MG) BY MOUTH DAILY 90 tablet 2   carvedilol (COREG) 12.5 MG tablet Take 1.5 tabs PO twice a day PO 270 tablet 3   dapagliflozin propanediol (FARXIGA) 10 MG TABS tablet Take 1 tablet (10 mg total) by mouth daily before breakfast. 30 tablet 6   DULoxetine (CYMBALTA) 30 MG capsule Take 90 mg by mouth daily.      entecavir (BARACLUDE) 0.5 MG tablet Take 1 tablet (0.5 mg total) by mouth every other day. 45 tablet 1   ENTRESTO 49-51 MG TAKE 1 TABLET BY MOUTH TWICE DAILY 180 tablet 1   ENTRESTO 49-51 MG TAKE 1 TABLET BY MOUTH TWICE DAILY 180 tablet 3   ezetimibe (ZETIA) 10 MG tablet TAKE 1 TABLET(10 MG) BY MOUTH DAILY 90 tablet 2   ferrous sulfate 325 (65 FE) MG tablet Take 325 mg by mouth daily with breakfast.     fluticasone (FLONASE) 50 MCG/ACT nasal spray SHAKE LIQUID AND USE 1 SPRAY IN EACH NOSTRIL DAILY 16 g 4   furosemide (LASIX) 40 MG tablet  TAKE 1/2 TABLET(20 MG) BY MOUTH DAILY 45 tablet 3   hydrALAZINE (APRESOLINE) 100 MG tablet Take 1 tablet (100 mg total) by mouth in the morning and at bedtime. (Patient taking differently: Take 100 mg by mouth 3 (three) times daily.) 180 tablet 1   ondansetron (ZOFRAN-ODT) 8 MG disintegrating tablet Take 8 mg by mouth 2 (two) times daily as needed for nausea or vomiting.     oxybutynin (DITROPAN) 5 MG tablet TAKE 1 TABLET(5 MG) BY MOUTH THREE TIMES DAILY 270 tablet 1   polyethylene glycol (MIRALAX) 17 g packet Take as directed, Daily, titrate as needed 14 each 0   RESTASIS 0.05 % ophthalmic emulsion 1 drop 2 (two) times daily.     sildenafil (REVATIO) 20 MG tablet Take 1 tablet (20 mg total) by mouth as needed. 90 tablet 0   SYMBICORT 160-4.5 MCG/ACT inhaler INHALE 2 PUFFS INTO THE LUNGS TWICE DAILY 10.2 g 2   tamsulosin (FLOMAX) 0.4 MG CAPS capsule TAKE 1 CAPSULE(0.4 MG) BY MOUTH DAILY 90 capsule 1   XARELTO 20 MG TABS tablet TAKE 1 TABLET(20 MG) BY MOUTH DAILY WITH SUPPER 90 tablet 1   No current facility-administered medications for this visit.   Allergies  Allergen Reactions   Prochlorperazine Other (See Comments)    Medication caused a lot of issues like blurred vision, nausea, pain, and carpool turnel   Codeine Itching, Nausea And Vomiting and Hives   Hydrocodone-Acetaminophen Itching and Nausea And Vomiting     Review of Systems: All systems reviewed and negative except where noted in HPI.   Lab Results  Component Value Date   WBC 8.3 02/04/2022   HGB 15.7 02/04/2022   HCT 46.5 02/04/2022   MCV 92.7 02/04/2022   PLT 182.0 02/04/2022    Lab Results  Component Value Date   CREATININE 2.47 (H) 02/04/2022   BUN 34 (H) 02/04/2022  NA 137 02/04/2022   K 3.7 02/04/2022   CL 104 02/04/2022   CO2 24 02/04/2022    Lab Results  Component Value Date   ALT 60 (H) 02/04/2022   AST 16 02/04/2022   ALKPHOS 62 02/04/2022   BILITOT 0.5 02/04/2022    Lab Results  Component  Value Date   INR 1.1 (H) 02/04/2022   INR 1.4 (H) 07/20/2021   INR 1.2 (H) 06/19/2020   MELD 3.0: 17 at 02/04/2022  2:03 PM MELD-Na: 16 at 02/04/2022  2:03 PM Calculated from: Serum Creatinine: 2.47 mg/dL at 02/04/2022  2:03 PM Serum Sodium: 137 mEq/L at 02/04/2022  2:03 PM Total Bilirubin: 0.5 mg/dL (Using min of 1 mg/dL) at 02/04/2022  2:03 PM Serum Albumin: 4.0 g/dL (Using max of 3.5 g/dL) at 02/04/2022  2:03 PM INR(ratio): 1.1 ratio at 02/04/2022  2:03 PM Age at listing (hypothetical): 23 years Sex: Male at 02/04/2022  2:03 PM    Physical Exam: BP (!) 142/90   Pulse 69   Ht _0  (1.905 m)   Wt 271 lb (122.9 kg)   BMI 33.87 kg/m  Constitutional: Pleasant,well-developed, male in no acute distress. Neurological: Alert and oriented to person place and time. Psychiatric: Normal mood and affect. Behavior is normal.   ASSESSMENT: 61 y.o. male here for assessment of the following  1. Cirrhosis of liver without ascites, unspecified hepatic cirrhosis type ([place])   2. Chronic hepatitis B ([place])   3. Chronic constipation   4. Hemorrhoids, unspecified hemorrhoid type   5. History of colon polyps   6. Anticoagulated    Generally his cirrhosis has been compensated and doing well over the past few years.  On Entecavir his chronic hepatitis B has been suppressed/treated.  We discussed his risks for decompensation moving forward and risk for [place].  He understands this.  He is due for a screening right upper quadrant ultrasound, otherwise due for repeat labs in April for his liver.  Continue Entecavir indefinitely for now, on renal dosing.  Taking MiraLAX twice daily for constipation which is working fairly well for him.  Discussed some options for hemorrhoid therapy, will give him some calomel suppositories to use as needed for hemorrhoids.  He is overdue for surveillance colonoscopy and were hoping to schedule that at this time however he is having another A-fib ablation in February, would  prefer that be done first prior to scheduling him.  We will hope to perform his case in May or so, his bowel symptoms are stable and his labs are stable in this regard.  He understands and agrees with the plan, we will follow-up with him after his blood work and ablation.  PLAN: - schedule RUQ [place] for [place] screening - due for labs in April CMET, CBC, AFP, hepatitis B DNA level - continue Miralax BID - titrate as needed - add Calmol4 suppositories PRN - samples give - he will proceed with AF ablation first in February, and then tentatively colonoscopy in spring 2024 once recovered if stable   - f/u in 6 months  Jolly Mango, MD [facility]

## 2022-04-08 NOTE — Patient Instructions (Signed)
_______________________________________________________  If you are age 59 or older, your body mass index should be between 23-30. Your Body mass index is 33.87 kg/m. If this is out of the aforementioned range listed, please consider follow up with your Primary Care Provider.  If you are age 9 or younger, your body mass index should be between 19-25. Your Body mass index is 33.87 kg/m. If this is out of the aformentioned range listed, please consider follow up with your Primary Care Provider.   ________________________________________________________  The [facility] providers would like to encourage you to use [facility] to communicate with providers for non-urgent requests or questions.  Due to long hold times on the telephone, sending your provider a message by [facility] may be a faster and more efficient way to get a response.  Please allow 48 business hours for a response.  Please remember that this is for non-urgent requests.  _______________________________________________________  Dennis Bast will be contacted by [facility] Scheduling in the next 2 days to arrange a liver ultrasound.  The number on your caller ID will be [phone], please answer when they call.  If you have not heard from them in 2 days please call [phone] to schedule.   You have been scheduled for an abdominal ultrasound at [facility] Radiology (1st floor of hospital) on ___________________ at ____________. Please arrive 30 minutes prior to your appointment for registration. Make certain not to have anything to eat or drink 6 hours prior to your appointment. Should you need to reschedule your appointment, please contact radiology at [phone]. This test typically takes about 30 minutes to perform.  You will be due for labs in April. We will remind you when it is time to go.  We will contact you to schedule you for a colonoscopy at [facility] in the Spring.  Please purchase the following medications over the  counter and take as directed: Miralax - use twice daily  We have given you samples of the following medication to take: Calmol 4 suppositories  Thank you for entrusting me with your care and for choosing [employer], Dr. [place] Cellar

## 2022-04-12 ENCOUNTER — Ambulatory Visit: Payer: Medicaid Other | Attending: Nurse Practitioner | Admitting: Nurse Practitioner

## 2022-04-12 ENCOUNTER — Encounter: Payer: Self-pay | Admitting: Nurse Practitioner

## 2022-04-12 VITALS — BP 158/90 | HR 84 | Ht 75.0 in | Wt 269.4 lb

## 2022-04-12 DIAGNOSIS — N184 Chronic kidney disease, stage 4 (severe): Secondary | ICD-10-CM | POA: Diagnosis not present

## 2022-04-12 DIAGNOSIS — E785 Hyperlipidemia, unspecified: Secondary | ICD-10-CM | POA: Diagnosis not present

## 2022-04-12 DIAGNOSIS — D6869 Other thrombophilia: Secondary | ICD-10-CM

## 2022-04-12 DIAGNOSIS — I4819 Other persistent atrial fibrillation: Secondary | ICD-10-CM

## 2022-04-12 DIAGNOSIS — I5042 Chronic combined systolic (congestive) and diastolic (congestive) heart failure: Secondary | ICD-10-CM | POA: Diagnosis not present

## 2022-04-12 DIAGNOSIS — I10 Essential (primary) hypertension: Secondary | ICD-10-CM

## 2022-04-12 DIAGNOSIS — I2510 Atherosclerotic heart disease of native coronary artery without angina pectoris: Secondary | ICD-10-CM

## 2022-04-12 NOTE — Patient Instructions (Signed)
Medication Instructions:   Your physician recommends that you continue on your current medications as directed. Please refer to the Current Medication list given to you today.   *If you need a refill on your cardiac medications before your next appointment, please call your pharmacy*   Lab Work:  None ordered.  If you have labs (blood work) drawn today and your tests are completely normal, you will receive your results only by: [place] (if you have MyChart) OR A paper copy in the mail If you have any lab test that is abnormal or we need to change your treatment, we will call you to review the results.   Testing/Procedures:  None ordered.   Follow-Up: At [facility], you and your health needs are our priority.  As part of our continuing mission to provide you with exceptional heart care, we have created designated Provider Care Teams.  These Care Teams include your primary Cardiologist (physician) and Advanced Practice Providers (APPs -  Physician Assistants and Nurse Practitioners) who all work together to provide you with the care you need, when you need it.  We recommend signing up for the patient portal called "MyChart".  Sign up information is provided on this After Visit Summary.  MyChart is used to connect with patients for Virtual Visits (Telemedicine).  Patients are able to view lab/test results, encounter notes, upcoming appointments, etc.  Non-urgent messages can be sent to your provider as well.   To learn more about what you can do with MyChart, go to [email or web address].    Your next appointment:   7 month(s)  The format for your next appointment:   In Person  Provider:   [name], MD     Other Instructions  Your physician wants you to follow-up in: 7 months with Dr. Burt Knack.  You will receive a reminder letter in the mail two months in advance. If you don't receive a letter, please call our office to schedule the follow-up  appointment.   Important Information About Sugar

## 2022-04-12 NOTE — Progress Notes (Signed)
Cardiology Office Note:    Date:  04/12/2022   ID:  Cody Ortega, DOB 29-Jul-1960, MRN [id number]  PCP:  Ladell Pier, MD   [facility] Providers Cardiologist:  [name], MD Electrophysiologist:  Cristopher Peru, MD     Referring MD: Ladell Pier, MD   Chief Complaint: follow-up HFpEF  History of Present Illness:    Cody Ortega is a 62 y.o. 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 Havery Moros), anxiety, persistent atrial fibrillation on chronic anticoagulation, HTN, stroke, sleep apnea (intolerant of CPAP, uses nose strips), CKD stage IV (followed by Dr. Candiss Norse). Atrial fibrillation has been managed with amiodarone and Xarelto, as well as carvedilol.   July 2023, scheduled with Dr. Curt Bears for A-fib ablation but had questionable CVA/TIA while visiting [place] and the procedure was canceled (he had incidentally taken all of his medications twice within a few hours).  He began to feel dizzy and weak and was taken to the hospital.  He was ultimately diagnosed with a small stroke but never had any focal deficits and did not require any specific intervention.  LVEF 30 to 35% stable for this patient.  No LV thrombus noted.  Carotid ultrasound showed carotid atherosclerosis with no high-grade carotid stenosis.  Multiple brain CTs were done and 1 showed a small area of low density in the right parietal occipital lobe suggesting an acute cortical infarction.  A follow-up CT later that day showed no acute hemorrhage or edema.  There were old strokes demonstrated in the posterior cerebral hemispheres bilaterally and old lacunar strokes in the right caudate nucleus and right central corona radiata.   Seen in cardiology clinic by Dr. Burt Knack on 12/22/2021 with no neurologic deficits, feeling back to his normal state of health.  Dr.  Burt Knack felt he could proceed with A-fib ablation and planned to communicate with Dr. Curt Bears regarding this.  Seen in A Fib clinic on 01/05/2022 by Adline Peals, PA.  He reported he had been without a phone recently and why he had not rescheduled his ablation  Today, he is here alone for follow-up. BP is quite elevated. Reports he just woke up about two hours ago and took medications about an hour ago. Has some chronic leg and back pain that he thinks are also contributing to elevated BP. Planning for ablation - sees Dr. Curt Bears on 2/1. Admits that he drinks a lot of Coke and not much water. Limits fluid intake to 2 L daily, probably less he says. He denies chest pain, shortness of breath, lower extremity edema, fatigue, palpitations, melena, hematuria, hemoptysis, diaphoresis, weakness, presyncope, syncope, orthopnea, and PND. Recently got a new puppy.    Past Medical History:  Diagnosis Date   Anxiety    Arthritis    Benzodiazepine dependence (HCC)    Bronchial asthma    CAD (coronary artery disease)    a. Cath 03/2010: mod RCA stenosis, severe diag stenosis, treated medically given lack of angina.   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 Episode in 2008 with spontaneous conversion, on amiodarone per EP.   S/P implantation of automatic cardioverter/defibrillator (AICD)    a. Medtronic, implanted 2012.   Sleep apnea    Stroke [facility]) 2004   Thyroid disease     Past Surgical History:  Procedure Laterality Date   CARDIOVERSION N/A  01/07/2014   Procedure: CARDIOVERSION;  Surgeon: Lelon Perla, MD;  Location: [facility] ENDOSCOPY;  Service: Cardiovascular;  Laterality: N/A;   CARDIOVERSION N/A 02/11/2014   Procedure: CARDIOVERSION;  Surgeon: Pixie Casino, MD;  Location: [place];  Service: Cardiovascular;  Laterality: N/A;   CARDIOVERSION N/A 10/01/2020   Procedure: CARDIOVERSION;  Surgeon: Werner Lean, MD;  Location: [place] ENDOSCOPY;  Service: Cardiovascular;  Laterality: N/A;   CATARACT EXTRACTION     COLONOSCOPY WITH PROPOFOL N/A 02/06/2018   Procedure: COLONOSCOPY WITH PROPOFOL;  Surgeon: Yetta Flock, MD;  Location: [facility] ENDOSCOPY;  Service: Gastroenterology;  Laterality: N/A;   EYE SURGERY     ICD GENERATOR CHANGEOUT N/A 01/17/2020   Procedure: ICD GENERATOR CHANGEOUT;  Surgeon: Evans Lance, MD;  Location: [place] CV LAB;  Service: Cardiovascular;  Laterality: N/A;   LASEK eye surgery Bilateral 05/2018   LASIK Bilateral    PACEMAKER INSERTION     POLYPECTOMY  02/06/2018   Procedure: POLYPECTOMY;  Surgeon: Yetta Flock, MD;  Location: [facility] ENDOSCOPY;  Service: Gastroenterology;;   TEE WITHOUT CARDIOVERSION N/A 01/07/2014   Procedure: TRANSESOPHAGEAL ECHOCARDIOGRAM (TEE);  Surgeon: Lelon Perla, MD;  Location: [facility] ENDOSCOPY;  Service: Cardiovascular;  Laterality: N/A;   TEE WITHOUT CARDIOVERSION N/A 02/11/2014   Procedure: TRANSESOPHAGEAL ECHOCARDIOGRAM (TEE);  Surgeon: Pixie Casino, MD;  Location: [facility] ENDOSCOPY;  Service: Cardiovascular;  Laterality: N/A;   TEE WITHOUT CARDIOVERSION N/A 10/01/2020   Procedure: TRANSESOPHAGEAL ECHOCARDIOGRAM (TEE);  Surgeon: Werner Lean, MD;  Location: [facility] ENDOSCOPY;  Service: Cardiovascular;  Laterality: N/A;   TONSILLECTOMY     TRANSTHORACIC ECHOCARDIOGRAM  10/2006, 12/2009    Current Medications: Current Meds  Medication Sig   acetaminophen (TYLENOL) 500 MG tablet Take 1,000 mg by mouth every 6 (six) hours as needed for  moderate pain or headache.   albuterol (VENTOLIN HFA) 108 (90 Base) MCG/ACT inhaler Inhale 2 puffs into the lungs every 6 (six) hours as needed for wheezing or shortness of breath.   alprazolam (XANAX) 2 MG tablet Take 2 mg by mouth every 6 (six) hours as needed for anxiety.   amiodarone (PACERONE) 200 MG tablet Take 1 tablet (200 mg total) by mouth daily. (Patient taking differently: Take 200 mg by mouth once a week.)   atorvastatin (LIPITOR) 10 MG tablet TAKE 1 TABLET(10 MG) BY MOUTH DAILY   carvedilol (COREG) 12.5 MG tablet Take 1.5 tabs PO twice a day PO   dapagliflozin propanediol (FARXIGA) 10 MG TABS tablet Take 1 tablet (10 mg total) by mouth daily before breakfast.   DULoxetine (CYMBALTA) 30 MG capsule Take 90 mg by mouth daily.    entecavir (BARACLUDE) 0.5 MG tablet Take 1 tablet (0.5 mg total) by mouth every other day.   ENTRESTO 49-51 MG TAKE 1 TABLET BY MOUTH TWICE DAILY   ezetimibe (ZETIA) 10 MG tablet TAKE 1 TABLET(10 MG) BY MOUTH DAILY   ferrous sulfate 325 (65 FE) MG tablet Take 325  mg by mouth daily with breakfast.   furosemide (LASIX) 40 MG tablet TAKE 1/2 TABLET(20 MG) BY MOUTH DAILY   hydrALAZINE (APRESOLINE) 100 MG tablet Take 100 mg by mouth 3 (three) times daily.   ondansetron (ZOFRAN-ODT) 8 MG disintegrating tablet Take 8 mg by mouth 2 (two) times daily as needed for nausea or vomiting.   oxybutynin (DITROPAN) 5 MG tablet TAKE 1 TABLET(5 MG) BY MOUTH THREE TIMES DAILY   polyethylene glycol (MIRALAX) 17 g packet Take 17 g by mouth 2 (two) times daily. Take as directed, Daily, titrate as needed   Rectal Protectant-Emollient (CALMOL-4) 76-10 % SUPP Use as directed once to twice daily   RESTASIS 0.05 % ophthalmic emulsion 1 drop 2 (two) times daily.   sildenafil (REVATIO) 20 MG tablet Take 1 tablet (20 mg total) by mouth as needed.   SYMBICORT 160-4.5 MCG/ACT inhaler INHALE 2 PUFFS INTO THE LUNGS TWICE DAILY   tamsulosin (FLOMAX) 0.4 MG CAPS capsule TAKE 1 CAPSULE(0.4 MG) BY  MOUTH DAILY   XARELTO 20 MG TABS tablet TAKE 1 TABLET(20 MG) BY MOUTH DAILY WITH SUPPER   [DISCONTINUED] hydrALAZINE (APRESOLINE) 100 MG tablet Take 1 tablet (100 mg total) by mouth in the morning and at bedtime. (Patient taking differently: Take 100 mg by mouth 3 (three) times daily.)     Allergies:   Prochlorperazine, Codeine, and Hydrocodone-acetaminophen   Social History   Socioeconomic History   Marital status: Divorced    Spouse name: Not on file   Number of children: 2   Years of education: Not on file   Highest education level: Not on file  Occupational History   Occupation: Unemployed    Employer: UNEMPLOYED    Comment: basketball referee in the past  Tobacco Use   Smoking status: Former    Packs/day: 0.50    Years: 0.50    Total pack years: 0.25    Types: Cigarettes    Quit date: 05/02/2010    Years since quitting: 11.9   Smokeless tobacco: Never   Tobacco comments:    Former smoker Significant Second-hand and only 3 months himself 01/05/22  Vaping Use   Vaping Use: Never used  Substance and Sexual Activity   Alcohol use: Not Currently    Alcohol/week: 0.0 standard drinks of alcohol    Comment: rare   Drug use: No    Comment: Reported history of cocaine abuse off and on    Sexual activity: Not on file  Other Topics Concern   Not on file  Social History Narrative   Living with a son who is a 38 year old.  He is not     working, unemployed for 3 years.  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  Denied use of alcohol.  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       [facility]:   Originally from [place], [place]. He grew up in [place]. He moved to [place] in 1985. 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 No pets currently. No bird exposure. He did have mold in a previous home. No hot tub exposure.         Social Determinants of Health   Financial Resource Strain: Not on file  Food  Insecurity: Not on file  Transportation Needs: Not on file  Physical Activity: Not on file  Stress: Not on file  Social Connections: Not on file  Family History: The patient's family history includes Breast cancer in his mother; Cerebral palsy in his daughter; Diabetes in his father; Multiple myeloma in his mother; Peripheral vascular disease in his father; Prostate cancer in his father. There is no history of Lung disease, Colon cancer, Stomach cancer, Esophageal cancer, or Colon polyps.  ROS:   Please see the history of present illness.   All other systems reviewed and are negative.  Labs/Other Studies Reviewed:    The following studies were reviewed today:  Echo 05/29/20 1. Left ventricular ejection fraction, by estimation, is 25 to 30%. The  left ventricle has severely decreased function. The left ventricle  demonstrates regional wall motion abnormalities (see scoring  diagram/findings for description). There is moderate  left ventricular hypertrophy. Left ventricular diastolic parameters are  consistent with Grade II diastolic dysfunction (pseudonormalization).   2. Right ventricular systolic function is normal. The right ventricular  size is normal. There is normal pulmonary artery systolic pressure.   3. Left atrial size was severely dilated.   4. Right atrial size was mildly dilated.   5. The mitral valve is normal in structure. No evidence of mitral valve  regurgitation. No evidence of mitral stenosis.   6. The aortic valve is normal in structure. Aortic valve regurgitation is  not visualized. No aortic stenosis is present.   7. The inferior vena cava is normal in size with greater than 50%  respiratory variability, suggesting right atrial pressure of 3 mmHg.   Comparison(s): No significant change from prior study. Prior images  reviewed side by side.  Recent Labs: 02/04/2022: ALT 60; BUN 34; Creatinine, Ser 2.47; Hemoglobin 15.7; Platelets 182.0; Potassium 3.7;  Sodium 137  Recent Lipid Panel    Component Value Date/Time   CHOL 196 05/08/2020 1332   TRIG 135 05/08/2020 1332   HDL 33 (L) 05/08/2020 1332   CHOLHDL 5.9 (H) 05/08/2020 1332   CHOLHDL 3.9 10/12/2015 1026   VLDL 27 10/12/2015 1026   LDLCALC 138 (H) 05/08/2020 1332   LDLDIRECT 102 (H) 09/15/2020 1635     Risk Assessment/Calculations:    CHA2DS2-VASc Score = 5  {This indicates a 7.2% annual risk of stroke. The patient's score is based upon: CHF History: 1 HTN History: 1 Diabetes History: 0 Stroke History: 2 Vascular Disease History: 1 Age Score: 0 Gender Score: 0    Physical Exam:    VS:  BP (!) 158/90   Pulse 84   Ht 6' 3" (1.905 m)   Wt 269 lb 6 oz (122.2 kg)   SpO2 93%   BMI 33.67 kg/m     Wt Readings from Last 3 Encounters:  04/12/22 269 lb 6 oz (122.2 kg)  04/08/22 271 lb (122.9 kg)  02/21/22 270 lb 8 oz (122.7 kg)     GEN:  Well nourished, well developed in no acute distress HEENT: Normal NECK: No JVD; No carotid bruits CARDIAC: Irregular RR, no murmurs, rubs, gallops RESPIRATORY:  Clear to auscultation without rales, wheezing or rhonchi  ABDOMEN: Soft, non-tender, non-distended MUSCULOSKELETAL:  No edema; No deformity. 2+ pedal pulses, equal bilaterally SKIN: Warm and dry NEUROLOGIC:  Alert and oriented x 3 PSYCHIATRIC:  Normal affect   EKG:  EKG is not ordered today  HYPERTENSION CONTROL Vitals:   04/12/22 1517 04/12/22 1657  BP: (!) 210/110 (!) 158/90    The patient's blood pressure is elevated above target today.  In order to address the patient's elevated BP: Blood pressure will be monitored at home to determine  if medication changes need to be made.; The blood pressure is usually elevated in clinic.  Blood pressures monitored at home have been optimal. (Question of med compliance)       Diagnoses:    1. Chronic combined systolic and diastolic CHF, NYHA class 2 ([place])   2. CKD (chronic kidney disease), stage IV (HCC)   3. Persistent  atrial fibrillation ([place])   4. Secondary hypercoagulable state ([place])   5. Essential hypertension, malignant   6. Coronary artery disease involving native coronary artery of native heart without angina pectoris   7. Hyperlipidemia LDL goal <70    Assessment and Plan:     Chronic combined CHF s/p ICD implant: LVEF 25-30%, moderate LVH, grade 2 diastolic dysfunction, RV normal size and function. No evidence of volume overload on exam.  He denies dyspnea, orthopnea, PND, edema. Reports that he is compliant with medications. Continue GDMT including carvedilol, Farxiga, Entresto, hydralazine, Lasix.  Hypertension: BP is elevated and remains elevated although improved on my recheck. Documentation in the past of noncompliance. Asked him to ensure that he is taking medications as directed. No changes today.   CAD without angina: Moderate RCA stenosis, severe diagonal stenosis on cath 2011, medical management. He denies chest pain, dyspnea, or other symptoms concerning for angina.  No indication for further ischemic evaluation at this time.  Not on antiplatelet therapy in the setting of stable CAD, on [place].  Continue atorvastatin, Zetia, carvedilol.   CKD Stage IV: Scr 2.47, GFR 27.86 on bmet 02/04/22, stable in comparison to previous labs.  Management per nephrology, no medication changes today.  Persistent atrial fibrillation on chronic anticoagulation: HR is well-controlled today. Remote device check 03/08/2022 revealed two 6-minute episodes of AT/AF. He denies symptoms. Takes amiodarone once weekly.  Is scheduled for A-fib ablation in February. No bleeding concerns on Xarelto.  Continue Xarelto, amiodarone, carvedilol.  Hyperlipidemia LDL goal < 70: LDL 38 on 11/14/21. Continue atorvastatin, Zetia.     Disposition: 7-8 months with Dr. Burt Knack  Medication Adjustments/Labs and Tests Ordered: Current medicines are reviewed at length with the patient today.  Concerns regarding medicines are outlined above.   No orders of the defined types were placed in this encounter.  No orders of the defined types were placed in this encounter.   Patient Instructions  Medication Instructions:   Your physician recommends that you continue on your current medications as directed. Please refer to the Current Medication list given to you today.   *If you need a refill on your cardiac medications before your next appointment, please call your pharmacy*   Lab Work:  None ordered.  If you have labs (blood work) drawn today and your tests are completely normal, you will receive your results only by: [place] (if you have MyChart) OR A paper copy in the mail If you have any lab test that is abnormal or we need to change your treatment, we will call you to review the results.   Testing/Procedures:  None ordered.   Follow-Up: At [facility], you and your health needs are our priority.  As part of our continuing mission to provide you with exceptional heart care, we have created designated Provider Care Teams.  These Care Teams include your primary Cardiologist (physician) and Advanced Practice Providers (APPs -  Physician Assistants and Nurse Practitioners) who all work together to provide you with the care you need, when you need it.  We recommend signing up for the patient portal called "MyChart".  Sign up information is provided on this After Visit Summary.  MyChart is used to connect with patients for Virtual Visits (Telemedicine).  Patients are able to view lab/test results, encounter notes, upcoming appointments, etc.  Non-urgent messages can be sent to your provider as well.   To learn more about what you can do with MyChart, go to [email or web address].    Your next appointment:   7 month(s)  The format for your next appointment:   In Person  Provider:   [name], MD     Other Instructions  Your physician wants you to follow-up in: 7 months with Dr. Burt Knack.  You  will receive a reminder letter in the mail two months in advance. If you don't receive a letter, please call our office to schedule the follow-up appointment.   Important Information About Sugar         Signed, Emmaline Life, NP  04/12/2022 5:14 PM    [place]

## 2022-04-14 ENCOUNTER — Other Ambulatory Visit: Payer: Self-pay | Admitting: Cardiovascular Disease

## 2022-04-14 MED ORDER — DAPAGLIFLOZIN PROPANEDIOL 10 MG PO TABS: 10.0000 mg | ORAL_TABLET | Freq: Every day | ORAL | 3 refills | Status: DC

## 2022-04-18 ENCOUNTER — Ambulatory Visit (INDEPENDENT_AMBULATORY_CARE_PROVIDER_SITE_OTHER): Payer: Medicaid Other

## 2022-04-18 DIAGNOSIS — Z95810 Presence of automatic (implantable) cardiac defibrillator: Secondary | ICD-10-CM | POA: Diagnosis not present

## 2022-04-18 DIAGNOSIS — I5022 Chronic systolic (congestive) heart failure: Secondary | ICD-10-CM | POA: Diagnosis not present

## 2022-04-20 ENCOUNTER — Telehealth: Payer: Self-pay

## 2022-04-20 NOTE — Telephone Encounter (Signed)
Remote ICM transmission received.  Attempted call to patient regarding ICM remote transmission and left detailed message per DPR.  Advised to return call for any fluid symptoms or questions. Next ICM remote transmission scheduled 05/23/2022.

## 2022-04-20 NOTE — Progress Notes (Signed)
EPIC Encounter for ICM Monitoring  Patient Name: DAISHON CHUI is a 61 y.o. male Date: 04/20/2022 Primary Care Physican: Ladell Pier, MD Primary Cardiologist: Burt Knack Electrophysiologist: Lovena Le 04/05/2022 Office Weight: 269 lbs   Time in AF <0.1 hr/day (<0.1%)                                                            Attempted call to patient and unable to reach.  Left detailed message per DPR regarding transmission. Transmission reviewed.    Optivol thoracic impedance suggesting normal fluid levels.     Prescribed:  Furosemide 40 mg take 0.5 tablet (20 mg total) by mouth daily.   Labs: 02/04/2022 Creatinine 2.47, BUN 34, Potassium 3.7, Sodium 137, GFR 27.86 12/01/2021 Creatinine 2.45, BUN 31, Potassium 4.3, Sodium 139, GFR 30  11/14/2021 Creatinine 3.51, BUN 42, Potassium 3.7, Sodium 140, GFR 18 11/13/2021 Creatinine 3.73, BUN 41, Potassium 3.8, Sodium 137, GFR 17  10/29/2021 Creatinine 2.58, BUN 29, Potassium 4.2, Sodium 141, GFR 28  07/20/2021 Creatinine 2.33, BUN 33, Potassium 3.8, Sodium 137, GFR 30 A complete set of results can be found in Results Review.   Recommendations:  Left voice mail with ICM number and encouraged to call if experiencing any fluid symptoms.   Follow-up plan: ICM clinic phone appointment on 05/23/2022.   91 day device clinic remote transmission 05/14/2022.     EP/Cardiology Office Visits:  Recall 10/09/2022 with Dr Burt Knack.  06/02/2022 with Dr Curt Bears.   Copy of ICM check sent to Dr. Lovena Le.      3 month ICM trend: 04/18/2022.    12-14 Month ICM trend:     Rosalene Billings, RN 04/20/2022 9:12 AM

## 2022-04-23 ENCOUNTER — Other Ambulatory Visit: Payer: Self-pay | Admitting: Cardiology

## 2022-04-27 ENCOUNTER — Ambulatory Visit (HOSPITAL_COMMUNITY)
Admission: RE | Admit: 2022-04-27 | Discharge: 2022-04-27 | Disposition: A | Discharge status: Home / Self Care | Payer: Medicaid Other | Source: Ambulatory Visit | Attending: Gastroenterology | Admitting: Gastroenterology

## 2022-04-27 DIAGNOSIS — K7460 Unspecified cirrhosis of liver: Secondary | ICD-10-CM | POA: Insufficient documentation

## 2022-04-27 DIAGNOSIS — B181 Chronic viral hepatitis B without delta-agent: Secondary | ICD-10-CM | POA: Diagnosis present

## 2022-05-02 DIAGNOSIS — J690 Pneumonitis due to inhalation of food and vomit: Secondary | ICD-10-CM | POA: Diagnosis not present

## 2022-05-02 DIAGNOSIS — I5020 Unspecified systolic (congestive) heart failure: Secondary | ICD-10-CM | POA: Diagnosis not present

## 2022-05-02 DIAGNOSIS — J9601 Acute respiratory failure with hypoxia: Secondary | ICD-10-CM | POA: Diagnosis not present

## 2022-05-03 ENCOUNTER — Other Ambulatory Visit: Payer: Self-pay | Admitting: Internal Medicine

## 2022-05-04 ENCOUNTER — Other Ambulatory Visit: Payer: Self-pay | Admitting: Internal Medicine

## 2022-05-04 DIAGNOSIS — N400 Enlarged prostate without lower urinary tract symptoms: Secondary | ICD-10-CM

## 2022-05-23 ENCOUNTER — Ambulatory Visit: Payer: Medicaid Other | Attending: Internal Medicine

## 2022-05-23 DIAGNOSIS — Z95810 Presence of automatic (implantable) cardiac defibrillator: Secondary | ICD-10-CM

## 2022-05-23 DIAGNOSIS — I5022 Chronic systolic (congestive) heart failure: Secondary | ICD-10-CM | POA: Diagnosis not present

## 2022-05-27 NOTE — Progress Notes (Signed)
EPIC Encounter for ICM Monitoring  Patient Name: Cody Ortega is a 62 y.o. male Date: 05/27/2022 Primary Care Physican: Ladell Pier, MD Primary Cardiologist: Burt Knack Electrophysiologist: Lovena Le 04/05/2022 Office Weight: 269 lbs    Time in AF     0.3 hr/day (1.1%)                                                            Transmission reviewed.    Optivol thoracic impedance suggesting normal fluid levels.     Prescribed:  Furosemide 40 mg take 0.5 tablet (20 mg total) by mouth daily.   Labs: 02/04/2022 Creatinine 2.47, BUN 34, Potassium 3.7, Sodium 137, GFR 27.86 12/01/2021 Creatinine 2.45, BUN 31, Potassium 4.3, Sodium 139, GFR 30  11/14/2021 Creatinine 3.51, BUN 42, Potassium 3.7, Sodium 140, GFR 18 11/13/2021 Creatinine 3.73, BUN 41, Potassium 3.8, Sodium 137, GFR 17  10/29/2021 Creatinine 2.58, BUN 29, Potassium 4.2, Sodium 141, GFR 28  07/20/2021 Creatinine 2.33, BUN 33, Potassium 3.8, Sodium 137, GFR 30 A complete set of results can be found in Results Review.   Recommendations:  No changes   Follow-up plan: ICM clinic phone appointment on 06/27/2022.   91 day device clinic remote transmission 06/06/2022.     EP/Cardiology Office Visits:  Recall 10/09/2022 with Dr Burt Knack.  06/02/2022 with Dr Curt Bears.   Copy of ICM check sent to Dr. Lovena Le.  3 month ICM trend: 05/23/2022.    12-14 Month ICM trend:     Rosalene Billings, RN 05/27/2022 10:56 AM

## 2022-06-02 ENCOUNTER — Encounter: Payer: Self-pay | Admitting: *Deleted

## 2022-06-02 ENCOUNTER — Ambulatory Visit: Payer: Medicaid Other | Attending: Cardiology | Admitting: Cardiology

## 2022-06-02 ENCOUNTER — Encounter: Payer: Self-pay | Admitting: Cardiology

## 2022-06-02 VITALS — BP 98/64 | HR 62 | Ht 75.0 in | Wt 273.0 lb

## 2022-06-02 DIAGNOSIS — I4819 Other persistent atrial fibrillation: Secondary | ICD-10-CM

## 2022-06-02 DIAGNOSIS — I5020 Unspecified systolic (congestive) heart failure: Secondary | ICD-10-CM | POA: Diagnosis not present

## 2022-06-02 DIAGNOSIS — J9601 Acute respiratory failure with hypoxia: Secondary | ICD-10-CM | POA: Diagnosis not present

## 2022-06-02 DIAGNOSIS — J690 Pneumonitis due to inhalation of food and vomit: Secondary | ICD-10-CM | POA: Diagnosis not present

## 2022-06-02 DIAGNOSIS — Z01812 Encounter for preprocedural laboratory examination: Secondary | ICD-10-CM

## 2022-06-02 DIAGNOSIS — Z79899 Other long term (current) drug therapy: Secondary | ICD-10-CM | POA: Diagnosis not present

## 2022-06-02 DIAGNOSIS — I5022 Chronic systolic (congestive) heart failure: Secondary | ICD-10-CM

## 2022-06-02 DIAGNOSIS — D6869 Other thrombophilia: Secondary | ICD-10-CM

## 2022-06-02 LAB — CUP PACEART REMOTE DEVICE CHECK
Battery Remaining Longevity: 122 mo
Battery Voltage: 2.99 V
Brady Statistic RV Percent Paced: 1.56 %
Date Time Interrogation Session: 20240201075259
HighPow Impedance: 48 Ohm
HighPow Impedance: 66 Ohm
Implantable Lead Connection Status: 753985
Implantable Lead Implant Date: 20120806
Implantable Lead Location: 753860
Implantable Lead Model: 185
Implantable Lead Serial Number: 345870
Implantable Pulse Generator Implant Date: 20210917
Lead Channel Impedance Value: 323 Ohm
Lead Channel Impedance Value: 342 Ohm
Lead Channel Pacing Threshold Amplitude: 1 V
Lead Channel Pacing Threshold Pulse Width: 0.4 ms
Lead Channel Sensing Intrinsic Amplitude: 7.25 mV
Lead Channel Sensing Intrinsic Amplitude: 7.25 mV
Lead Channel Setting Pacing Amplitude: 2 V
Lead Channel Setting Pacing Pulse Width: 0.4 ms
Lead Channel Setting Sensing Sensitivity: 0.3 mV
Zone Setting Status: 755011
Zone Setting Status: 755011

## 2022-06-02 LAB — CBC
Hematocrit: 46.7 % (ref 37.5–51.0)
Hemoglobin: 16 g/dL (ref 13.0–17.7)
MCH: 31.1 pg (ref 26.6–33.0)
MCHC: 34.3 g/dL (ref 31.5–35.7)
MCV: 91 fL (ref 79–97)
Platelets: 185 10*3/uL (ref 150–450)
RBC: 5.15 x10E6/uL (ref 4.14–5.80)
RDW: 15.1 % (ref 11.6–15.4)
WBC: 7.2 10*3/uL (ref 3.4–10.8)

## 2022-06-02 LAB — BASIC METABOLIC PANEL
BUN/Creatinine Ratio: 10 (ref 9–20)
BUN: 26 mg/dL — ABNORMAL HIGH (ref 6–24)
CO2: 26 mmol/L (ref 20–29)
Calcium: 9.1 mg/dL (ref 8.7–10.2)
Chloride: 98 mmol/L (ref 96–106)
Creatinine, Ser: 2.52 mg/dL — ABNORMAL HIGH (ref 0.76–1.27)
Glucose: 129 mg/dL — ABNORMAL HIGH (ref 70–99)
Potassium: 4 mmol/L (ref 3.5–5.2)
Sodium: 133 mmol/L — ABNORMAL LOW (ref 134–144)
eGFR: 29 mL/min/{1.73_m2} — ABNORMAL LOW (ref >59)

## 2022-06-02 NOTE — Progress Notes (Signed)
Electrophysiology Office Note   Date:  06/02/2022   ID:  Cody Ortega, Cody Ortega 09-01-1960, MRN [id number]  PCP:  Ladell Pier, MD  Cardiologist:  Burt Knack Primary Electrophysiologist: Gaye Alken, MD    Chief Complaint: AF   History of Present Illness: Cody Ortega is a 62 y.o. male who is being seen today for the evaluation of AF at the request of Ladell Pier, MD. Presenting today for electrophysiology evaluation.  He has a history significant for coronary artery disease, chronic systolic heart failure, cirrhosis, hepatitis B, atrial fibrillation, hypertension, sleep apnea, CKD stage IV.  He is currently on amiodarone.  He has plans for atrial fibrillation ablation.  Today, denies symptoms of palpitations, chest pain, shortness of breath, orthopnea, PND, lower extremity edema, claudication, dizziness, presyncope, syncope, bleeding, or neurologic sequela. The patient is tolerating medications without difficulties.      Past Medical History:  Diagnosis Date   Anxiety    Arthritis    Benzodiazepine dependence (HCC)    Bronchial asthma    CAD (coronary artery disease)    a. Cath 03/2010: mod RCA stenosis, severe diag stenosis, treated medically given lack of angina.   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 Episode in 2008 with spontaneous conversion, on amiodarone per EP.   S/P implantation of automatic cardioverter/defibrillator (AICD)    a. Medtronic, implanted 2012.   Sleep apnea     Stroke [facility]) 2004   Thyroid disease    Past Surgical History:  Procedure Laterality Date   CARDIOVERSION N/A 01/07/2014   Procedure: CARDIOVERSION;  Surgeon: Lelon Perla, MD;  Location[facility] ENDOSCOPY;  Service: Cardiovascular;  Laterality: N/A;   CARDIOVERSION N/A 02/11/2014   Procedure: CARDIOVERSION;  Surgeon: Pixie Casino, MD;  Location: [place];  Service: Cardiovascular;  Laterality: N/A;   CARDIOVERSION N/A 10/01/2020   Procedure: CARDIOVERSION;  Surgeon: Werner Lean, MD;  Location: [place] ENDOSCOPY;  Service: Cardiovascular;  Laterality: N/A;   CATARACT EXTRACTION     COLONOSCOPY WITH PROPOFOL N/A 02/06/2018   Procedure: COLONOSCOPY WITH PROPOFOL;  Surgeon: Yetta Flock, MD;  Location: [facility] ENDOSCOPY;  Service: Gastroenterology;  Laterality: N/A;   EYE SURGERY     ICD GENERATOR CHANGEOUT N/A 01/17/2020   Procedure: ICD GENERATOR CHANGEOUT;  Surgeon: Evans Lance, MD;  Location: [place] CV LAB;  Service: Cardiovascular;  Laterality: N/A;   LASEK eye surgery Bilateral 05/2018   LASIK Bilateral    PACEMAKER INSERTION     POLYPECTOMY  02/06/2018   Procedure: POLYPECTOMY;  Surgeon: Yetta Flock, MD;  Location: [facility] ENDOSCOPY;  Service: Gastroenterology;;   TEE WITHOUT CARDIOVERSION N/A 01/07/2014   Procedure: TRANSESOPHAGEAL ECHOCARDIOGRAM (TEE);  Surgeon: Lelon Perla, MD;  Location: [facility] ENDOSCOPY;  Service: Cardiovascular;  Laterality: N/A;   TEE WITHOUT CARDIOVERSION N/A 02/11/2014   Procedure: TRANSESOPHAGEAL ECHOCARDIOGRAM (TEE);  Surgeon: Pixie Casino, MD;  Location: [facility] ENDOSCOPY;  Service: Cardiovascular;  Laterality: N/A;   TEE WITHOUT CARDIOVERSION N/A 10/01/2020   Procedure: TRANSESOPHAGEAL ECHOCARDIOGRAM (TEE);  Surgeon: Werner Lean, MD;  Location: [facility] ENDOSCOPY;  Service: Cardiovascular;  Laterality: N/A;   TONSILLECTOMY     TRANSTHORACIC ECHOCARDIOGRAM  10/2006, 12/2009     Current Outpatient Medications   Medication Sig Dispense Refill   acetaminophen (TYLENOL) 500 MG tablet Take 1,000 mg by mouth every 6 (six) hours as needed for moderate pain or headache.     albuterol (PROVENTIL) (2.5 MG/3ML) 0.083% nebulizer solution Take 3 mLs (2.5 mg total) by nebulization every 6 (six) hours as needed for wheezing or shortness of breath. 150 mL 6   albuterol (VENTOLIN HFA) 108 (90 Base) MCG/ACT inhaler Inhale 2 puffs into the lungs every 6 (six) hours as needed for wheezing or shortness of breath. 8.5 g 6   alprazolam (XANAX) 2 MG tablet Take 2 mg by mouth every 6 (six) hours as needed for anxiety.     amiodarone (PACERONE) 200 MG tablet Take 1 tablet (200 mg total) by mouth daily. (Patient taking differently: Take 200 mg by mouth once a week.) 90 tablet 2   atorvastatin (LIPITOR) 10 MG tablet TAKE 1 TABLET(10 MG) BY MOUTH DAILY 90 tablet 2   carvedilol (COREG) 12.5 MG tablet Take 1.5 tabs PO twice a day PO 270 tablet 3   dapagliflozin propanediol (FARXIGA) 10 MG TABS tablet Take 1 tablet (10 mg total) by mouth daily before breakfast. 90 tablet 3   DULoxetine (CYMBALTA) 30 MG capsule Take 90 mg by mouth daily.      entecavir (BARACLUDE) 0.5 MG tablet Take 1 tablet (0.5 mg total) by mouth every other day. 45 tablet 1   ENTRESTO 49-51 MG TAKE 1 TABLET BY MOUTH TWICE DAILY 180 tablet 1   ezetimibe (ZETIA) 10 MG tablet TAKE 1 TABLET(10 MG) BY MOUTH DAILY 90 tablet 2   ferrous sulfate 325 (65 FE) MG tablet Take 325 mg by mouth daily with breakfast.     fluticasone (FLONASE) 50 MCG/ACT nasal spray SHAKE LIQUID AND USE 1 SPRAY IN EACH NOSTRIL DAILY 16 g 4   furosemide (LASIX) 40 MG tablet TAKE 1/2 TABLET(20 MG) BY MOUTH DAILY 45 tablet 3   hydrALAZINE (APRESOLINE) 100 MG tablet TAKE 1 TABLET(100 MG) BY MOUTH IN THE MORNING AND AT BEDTIME 180 tablet 3   ondansetron (ZOFRAN-ODT) 8 MG disintegrating tablet Take 8 mg by mouth 2 (two) times daily as needed for nausea or vomiting.     oxybutynin (DITROPAN) 5 MG tablet  TAKE 1 TABLET(5 MG) BY MOUTH THREE TIMES DAILY 90 tablet 0   polyethylene glycol (MIRALAX) 17 g packet Take 17 g by mouth 2 (two) times daily. Take as directed, Daily, titrate as needed 14 each 0   Rectal Protectant-Emollient (CALMOL-4) 76-10 % SUPP Use as directed once to twice daily 4 suppository 0   RESTASIS 0.05 % ophthalmic emulsion 1 drop 2 (two) times daily.     sildenafil (REVATIO) 20 MG tablet Take 1 tablet (20 mg total) by mouth as needed. 90 tablet 0   SYMBICORT 160-4.5 MCG/ACT inhaler INHALE 2 PUFFS INTO THE LUNGS TWICE DAILY 10.2 g 2   tamsulosin (FLOMAX) 0.4 MG CAPS capsule TAKE 1 CAPSULE(0.4 MG) BY MOUTH DAILY 90 capsule 1   XARELTO 20 MG TABS tablet TAKE 1 TABLET(20 MG) BY MOUTH DAILY WITH SUPPER 90 tablet 1   No current facility-administered medications for this visit.  Allergies:   Prochlorperazine, Codeine, and Hydrocodone-acetaminophen   Social History:  The patient  reports that he quit smoking about 12 years ago. His smoking use included cigarettes. He has a 0.25 pack-year smoking history. He has never used smokeless tobacco. He reports that he does not currently use alcohol. He reports that he does not use drugs.   Family History:  The patient's family history includes Breast cancer in his mother; Cerebral palsy in his daughter; Diabetes in his father; Multiple myeloma in his mother; Peripheral vascular disease in his father; Prostate cancer in his father.   ROS:  Please see the history of present illness.   Otherwise, review of systems is positive for none.   All other systems are reviewed and negative.   PHYSICAL EXAM: VS:  BP 98/64   Pulse 62   Ht '6\' 3"'$  (1.905 m)   Wt 273 lb (123.8 kg)   SpO2 95%   BMI 34.12 kg/m  , BMI Body mass index is 34.12 kg/m. GEN: Well nourished, well developed, in no acute distress  HEENT: normal  Neck: no JVD, carotid bruits, or masses Cardiac: RRR; no murmurs, rubs, or gallops,no edema  Respiratory:  clear to auscultation  bilaterally, normal work of breathing GI: soft, nontender, nondistended, + BS MS: no deformity or atrophy  Skin: warm and dry, device site well healed Neuro:  Strength and sensation are intact Psych: euthymic mood, full affect  EKG:  EKG is ordered today. Personal review of the ekg ordered shows sinus rhythm with PACs, incomplete left bundle branch block, rate 62  Personal review of the device interrogation today. Results in [place]: 02/04/2022: ALT 60; BUN 34; Creatinine, Ser 2.47; Hemoglobin 15.7; Platelets 182.0; Potassium 3.7; Sodium 137    Lipid Panel     Component Value Date/Time   CHOL 196 05/08/2020 1332   TRIG 135 05/08/2020 1332   HDL 33 (L) 05/08/2020 1332   CHOLHDL 5.9 (H) 05/08/2020 1332   CHOLHDL 3.9 10/12/2015 1026   VLDL 27 10/12/2015 1026   LDLCALC 138 (H) 05/08/2020 1332   LDLDIRECT 102 (H) 09/15/2020 1635     Wt Readings from Last 3 Encounters:  06/02/22 273 lb (123.8 kg)  04/12/22 269 lb 6 oz (122.2 kg)  04/08/22 271 lb (122.9 kg)      Other studies Reviewed: Additional studies/ records that were reviewed today include: TEE 10/01/20  Review of the above records today demonstrates:   1. Left ventricular ejection fraction, by estimation, is 30 to 35%. Left  ventricular ejection fraction by 3D volume is 32 %. The left ventricle has  moderately decreased function. The left ventricle demonstrates global  hypokinesis. The left ventricular  internal cavity size was mildly to moderately dilated.   2. Right ventricular systolic function is mildly reduced. The right  ventricular size is normal. In the gastric views, there appears to be a  small echodensity on the device lead (2 cm at largest). In evalation of  the valve in the bicaval view there does  not appears to be evidence of abnormal echodensity (see long series 68).  Clinical Correlation advised.   3. Left atrial size was severely dilated. No left atrial/left atrial  appendage thrombus  was detected.   4. Right atrial size was mildly dilated.   5. The mitral valve is normal in structure. Mild to moderate mitral valve  regurgitation, central in nature with VCW 0.5 and with pulmonary vein  systolic blunting.   6.  The aortic valve is tricuspid. Aortic valve regurgitation is not  visualized.    ASSESSMENT AND PLAN:  1.  Persistent atrial fibrillation: CHA2DS2-VASc of 5.  Currently on amiodarone and Xarelto, doses above.  Bevan Disney plan for ablation get him off of amiodarone.  Risk and benefits were discussed.  He understands the risks and is agreed to the procedure.  Risk, benefits, and alternatives to EP study and radiofrequency ablation for afib were also discussed in detail today. These risks include but are not limited to stroke, bleeding, vascular damage, tamponade, perforation, damage to the esophagus, lungs, and other structures, pulmonary vein stenosis, worsening renal function, and death. The patient understands these risk and wishes to proceed.  We Zannie Runkle therefore proceed with catheter ablation at the next available time.  Carto, ICE, anesthesia are requested for the procedure.  Breaker Springer also obtain CT PV protocol prior to the procedure to exclude LAA thrombus and further evaluate atrial anatomy.   2.  Secondary hypercoagulable state: Currently on Xarelto for atrial fibrillation as above  3.  High risk medication monitoring: Currently on amiodarone for atrial fibrillation.  Labs without major abnormality.  4.  Obstructive sleep apnea: CPAP compliance encouraged  5.  Chronic systolic heart failure: Status post Medtronic ICD.  Device functioning appropriately.  Heart failure therapy per primary cardiology.   Current medicines are reviewed at length with the patient today.   The patient does not have concerns regarding his medicines.  The following changes were made today: none  Labs/ tests ordered today include:  Orders Placed This Encounter  Procedures   Basic metabolic  panel   CBC     Disposition:   FU 3 months  Signed, Arnold Depinto Meredith Leeds, MD  06/02/2022 2:51 PM     [facility] [address] [place] [address] [phone] (office) [phone] (fax)'

## 2022-06-02 NOTE — Patient Instructions (Addendum)
Medication Instructions:  Your physician recommends that you continue on your current medications as directed. Please refer to the Current Medication list given to you today.  *If you need a refill on your cardiac medications before your next appointment, please call your pharmacy*  Lab Work: Pre procedure labs today:  BMP & CBC  If you have labs (blood work) drawn today and your tests are completely normal, you will receive your results only by: [place] (if you have MyChart) OR A paper copy in the mail If you have any lab test that is abnormal or we need to change your treatment, we will call you to review the results.   Testing/Procedures: Your physician has requested that you have cardiac CT within 7 days PRIOR to your ablation. Cardiac computed tomography (CT) is a painless test that uses an x-ray machine to take clear, detailed pictures of your heart.  Please follow instruction below located under "other instructions". You will get a call from our office to schedule the date for this test.  Your physician has recommended that you have an ablation. Catheter ablation is a medical procedure used to treat some cardiac arrhythmias (irregular heartbeats). During catheter ablation, a long, thin, flexible tube is put into a blood vessel in your groin (upper thigh), or neck. This tube is called an ablation catheter. It is then guided to your heart through the blood vessel. Radio frequency waves destroy small areas of heart tissue where abnormal heartbeats may cause an arrhythmia to start. Please follow instruction letter given to you today.   Follow-Up: At [facility], you and your health needs are our priority.  As part of our continuing mission to provide you with exceptional heart care, we have created designated Provider Care Teams.  These Care Teams include your primary Cardiologist (physician) and Advanced Practice Providers (APPs -  Physician Assistants and Nurse Practitioners)  who all work together to provide you with the care you need, when you need it.  We recommend signing up for the patient portal called "MyChart".  Sign up information is provided on this After Visit Summary.  MyChart is used to connect with patients for Virtual Visits (Telemedicine).  Patients are able to view lab/test results, encounter notes, upcoming appointments, etc.  Non-urgent messages can be sent to your provider as well.   To learn more about what you can do with MyChart, go to [email or web address].    Your next appointment:   1 month(s) after your ablation  The format for your next appointment:   In Person  Provider:   AFib clinic   Thank you for choosing [facility]!!   Trinidad Curet, RN [phone]    Other Instructions   Cardiac Ablation Cardiac ablation is a procedure to destroy (ablate) some heart tissue that is sending bad signals. These bad signals cause problems in heart rhythm. The heart has many areas that make these signals. If there are problems in these areas, they can make the heart beat in a way that is not normal. Destroying some tissues can help make the heart rhythm normal. Tell your doctor about: Any allergies you have. All medicines you are taking. These include vitamins, herbs, eye drops, creams, and over-the-counter medicines. Any problems you or family members have had with medicines that make you fall asleep (anesthetics). Any blood disorders you have. Any surgeries you have had. Any medical conditions you have, such as kidney failure. Whether you are pregnant or may be pregnant. What are the risks?  This is a safe procedure. But problems may occur, including: Infection. Bruising and bleeding. Bleeding into the chest. Stroke or blood clots. Damage to nearby areas of your body. Allergies to medicines or dyes. The need for a pacemaker if the normal system is damaged. Failure of the procedure to treat the problem. What happens before  the procedure? Medicines Ask your doctor about: Changing or stopping your normal medicines. This is important. Taking aspirin and ibuprofen. Do not take these medicines unless your doctor tells you to take them. Taking other medicines, vitamins, herbs, and supplements. General instructions Follow instructions from your doctor about what you cannot eat or drink. Plan to have someone take you home from the hospital or clinic. If you will be going home right after the procedure, plan to have someone with you for 24 hours. Ask your doctor what steps will be taken to prevent infection. What happens during the procedure?  An IV tube will be put into one of your veins. You will be given a medicine to help you relax. The skin on your neck or groin will be numbed. A cut (incision) will be made in your neck or groin. A needle will be put through your cut and into a large vein. A tube (catheter) will be put into the needle. The tube will be moved to your heart. Dye may be put through the tube. This helps your doctor see your heart. Small devices (electrodes) on the tube will send out signals. A type of energy will be used to destroy some heart tissue. The tube will be taken out. Pressure will be held on your cut. This helps stop bleeding. A bandage will be put over your cut. The exact procedure may vary among doctors and hospitals. What happens after the procedure? You will be watched until you leave the hospital or clinic. This includes checking your heart rate, breathing rate, oxygen, and blood pressure. Your cut will be watched for bleeding. You will need to lie still for a few hours. Do not drive for 24 hours or as long as your doctor tells you. Summary Cardiac ablation is a procedure to destroy some heart tissue. This is done to treat heart rhythm problems. Tell your doctor about any medical conditions you may have. Tell him or her about all medicines you are taking to treat them. This is a  safe procedure. But problems may occur. These include infection, bruising, bleeding, and damage to nearby areas of your body. Follow what your doctor tells you about food and drink. You may also be told to change or stop some of your medicines. After the procedure, do not drive for 24 hours or as long as your doctor tells you. This information is not intended to replace advice given to you by your health care provider. Make sure you discuss any questions you have with your health care provider. Document Revised: 07/09/2021 Document Reviewed: 03/21/2019 Elsevier Patient Education  [place].

## 2022-06-03 NOTE — Addendum Note (Signed)
Addended by: Gwendlyn Deutscher on: 06/03/2022 12:53 PM   Modules accepted: Orders

## 2022-06-06 ENCOUNTER — Ambulatory Visit: Payer: Medicaid Other

## 2022-06-06 DIAGNOSIS — I428 Other cardiomyopathies: Secondary | ICD-10-CM

## 2022-06-06 DIAGNOSIS — I480 Paroxysmal atrial fibrillation: Secondary | ICD-10-CM

## 2022-06-07 LAB — CUP PACEART REMOTE DEVICE CHECK
Battery Remaining Longevity: 122 mo
Battery Voltage: 3 V
Brady Statistic RV Percent Paced: 0.83 %
Date Time Interrogation Session: 20240205102304
HighPow Impedance: 43 Ohm
HighPow Impedance: 58 Ohm
Implantable Lead Connection Status: 753985
Implantable Lead Implant Date: 20120806
Implantable Lead Location: 753860
Implantable Lead Model: 185
Implantable Lead Serial Number: 345870
Implantable Pulse Generator Implant Date: 20210917
Lead Channel Impedance Value: 323 Ohm
Lead Channel Impedance Value: 342 Ohm
Lead Channel Pacing Threshold Amplitude: 1.125 V
Lead Channel Pacing Threshold Pulse Width: 0.4 ms
Lead Channel Sensing Intrinsic Amplitude: 6.75 mV
Lead Channel Sensing Intrinsic Amplitude: 6.75 mV
Lead Channel Setting Pacing Amplitude: 2.5 V
Lead Channel Setting Pacing Pulse Width: 0.4 ms
Lead Channel Setting Sensing Sensitivity: 0.3 mV
Zone Setting Status: 755011
Zone Setting Status: 755011

## 2022-06-09 ENCOUNTER — Encounter (HOSPITAL_COMMUNITY): Payer: Self-pay | Admitting: *Deleted

## 2022-06-27 ENCOUNTER — Ambulatory Visit: Payer: Medicaid Other | Attending: Internal Medicine

## 2022-06-27 DIAGNOSIS — Z95810 Presence of automatic (implantable) cardiac defibrillator: Secondary | ICD-10-CM

## 2022-06-27 DIAGNOSIS — I5022 Chronic systolic (congestive) heart failure: Secondary | ICD-10-CM

## 2022-06-28 NOTE — Pre-Procedure Instructions (Signed)
Attempted to call patient regarding procedure instructions.  Left voicemail on the following items: Arrival time 0800 Nothing to eat or drink after midnight No meds AM of procedure Responsible person to drive you home and stay with you for 24 hrs  Have you missed any doses of anti-coagulant Xarelto- if you have missed any doses please let office know right away

## 2022-06-29 ENCOUNTER — Encounter (HOSPITAL_COMMUNITY): Admission: RE | Payer: Self-pay | Source: Home / Self Care

## 2022-06-29 ENCOUNTER — Ambulatory Visit (HOSPITAL_COMMUNITY): Admission: RE | Admit: 2022-06-29 | Payer: Medicaid Other | Source: Home / Self Care | Admitting: Cardiology

## 2022-06-29 DIAGNOSIS — I4819 Other persistent atrial fibrillation: Secondary | ICD-10-CM

## 2022-06-29 SURGERY — ATRIAL FIBRILLATION ABLATION
Anesthesia: General

## 2022-07-01 DIAGNOSIS — I5020 Unspecified systolic (congestive) heart failure: Secondary | ICD-10-CM | POA: Diagnosis not present

## 2022-07-01 DIAGNOSIS — J9601 Acute respiratory failure with hypoxia: Secondary | ICD-10-CM | POA: Diagnosis not present

## 2022-07-01 DIAGNOSIS — J690 Pneumonitis due to inhalation of food and vomit: Secondary | ICD-10-CM | POA: Diagnosis not present

## 2022-07-01 NOTE — Progress Notes (Signed)
EPIC Encounter for ICM Monitoring  Patient Name: Cody Ortega is a 62 y.o. male Date: 07/01/2022 Primary Care Physican: Ladell Pier, MD Primary Cardiologist: Burt Knack Electrophysiologist: Lovena Le 06/02/2022 Office Weight: 273 lbs    Since 06-Jun-2022 Time in AF  <0.1 hr/day (0.2%)                                                            Transmission reviewed.    Optivol thoracic impedance suggesting intermittent days with possible fluid accumulation.     Prescribed:  Furosemide 40 mg take 0.5 tablet (20 mg total) by mouth daily.   Labs: 06/02/2022 Creatinine 2.52, BUN 26, Potassium 4.0, Sodium 133, GFR 29 A complete set of results can be found in Results Review.   Recommendations:  No changes   Follow-up plan: ICM clinic phone appointment on 08/01/2022.   91 day device clinic remote transmission 09/05/2022.     EP/Cardiology Office Visits:  Recall 10/09/2022 with Dr Burt Knack.  09/30/2022 with Dr Curt Bears.   Copy of ICM check sent to Dr. Lovena Le.  3 month ICM trend: 06/27/2022.    12-14 Month ICM trend:     Rosalene Billings, RN 07/01/2022 4:28 PM

## 2022-07-06 ENCOUNTER — Telehealth: Payer: Self-pay | Admitting: *Deleted

## 2022-07-06 NOTE — Telephone Encounter (Signed)
Left message to call back  (Need to inform pt:  need to cancel 3/27 AFib clinic appt, keep 5/31 appt w/ Camnitz)

## 2022-07-11 NOTE — Telephone Encounter (Signed)
Reached pt and informed of MD recommendation after pt cancelled ablation. Aware cancelling 3/27 afib clinic appt, advised to keep 5/31 OV w/ Camnitz. Patient verbalized understanding and agreeable to plan.

## 2022-07-20 DIAGNOSIS — G4733 Obstructive sleep apnea (adult) (pediatric): Secondary | ICD-10-CM | POA: Diagnosis not present

## 2022-07-20 NOTE — Progress Notes (Signed)
Remote ICD transmission.   

## 2022-07-27 ENCOUNTER — Ambulatory Visit (HOSPITAL_COMMUNITY): Payer: Medicaid Other | Admitting: Physician Assistant

## 2022-08-01 ENCOUNTER — Ambulatory Visit: Payer: Medicaid Other | Attending: Internal Medicine

## 2022-08-01 DIAGNOSIS — J690 Pneumonitis due to inhalation of food and vomit: Secondary | ICD-10-CM | POA: Diagnosis not present

## 2022-08-01 DIAGNOSIS — I5020 Unspecified systolic (congestive) heart failure: Secondary | ICD-10-CM | POA: Diagnosis not present

## 2022-08-01 DIAGNOSIS — J9601 Acute respiratory failure with hypoxia: Secondary | ICD-10-CM | POA: Diagnosis not present

## 2022-08-01 DIAGNOSIS — I5022 Chronic systolic (congestive) heart failure: Secondary | ICD-10-CM | POA: Diagnosis not present

## 2022-08-01 DIAGNOSIS — Z95810 Presence of automatic (implantable) cardiac defibrillator: Secondary | ICD-10-CM | POA: Diagnosis not present

## 2022-08-02 ENCOUNTER — Telehealth: Payer: Self-pay

## 2022-08-02 DIAGNOSIS — K7460 Unspecified cirrhosis of liver: Secondary | ICD-10-CM

## 2022-08-02 DIAGNOSIS — B181 Chronic viral hepatitis B without delta-agent: Secondary | ICD-10-CM

## 2022-08-02 NOTE — Telephone Encounter (Signed)
Patient scheduled for appointment in June for 6 Mo F/U. Labs entered. Letter mailed to patient to go to lab and reschedule appointment if 6-24 is not convenient.

## 2022-08-02 NOTE — Telephone Encounter (Signed)
-----   Message from Larina Bras, [place] sent at 08/01/2022  4:00 PM EDT ----- Regarding: FW: due for labs in April Left message for patient to call back. Looks like he needs labs but also needs to schedule 6 month follow up (for June). ----- Message ----- From: Roetta Sessions, CMA Sent: 08/01/2022  12:00 AM EDT To: Roetta Sessions, CMA Subject: due for labs in April                          Due for cmet, cbc, AFP in April (next week)

## 2022-08-03 NOTE — Telephone Encounter (Signed)
Patient returned call, is aware to go to the lab and confirmed appointment.

## 2022-08-03 NOTE — Progress Notes (Signed)
EPIC Encounter for ICM Monitoring  Patient Name: Cody Ortega is a 62 y.o. male Date: 08/03/2022 Primary Care Physican: Ladell Pier, MD Primary Cardiologist: Burt Knack Electrophysiologist: Lovena Le 06/02/2022 Office Weight: 273 lbs    Since 27-Jun-2022 Time in AF  <0.1 hr/day (0.2%)                                                            Transmission reviewed.    Optivol thoracic impedance suggesting intermittent days with possible fluid accumulation.     Prescribed:  Furosemide 40 mg take 0.5 tablet (20 mg total) by mouth daily.   Labs: 06/02/2022 Creatinine 2.52, BUN 26, Potassium 4.0, Sodium 133, GFR 29 A complete set of results can be found in Results Review.   Recommendations:  No changes   Follow-up plan: ICM clinic phone appointment on 09/06/2022.   91 day device clinic remote transmission 09/05/2022.     EP/Cardiology Office Visits:  Recall 10/09/2022 with Dr Burt Knack.  09/30/2022 with Dr Curt Bears.   Copy of ICM check sent to Dr. Lovena Le.  3 month ICM trend: 08/01/2022.    12-14 Month ICM trend:     Rosalene Billings, RN 08/03/2022 1:19 PM

## 2022-08-10 ENCOUNTER — Other Ambulatory Visit (INDEPENDENT_AMBULATORY_CARE_PROVIDER_SITE_OTHER): Payer: Medicaid Other

## 2022-08-10 DIAGNOSIS — B181 Chronic viral hepatitis B without delta-agent: Secondary | ICD-10-CM

## 2022-08-10 DIAGNOSIS — K7460 Unspecified cirrhosis of liver: Secondary | ICD-10-CM | POA: Diagnosis not present

## 2022-08-10 LAB — CBC WITH DIFFERENTIAL/PLATELET
Basophils Absolute: 0.2 10*3/uL — ABNORMAL HIGH (ref 0.0–0.1)
Basophils Relative: 1.4 % (ref 0.0–3.0)
Eosinophils Absolute: 0.4 10*3/uL (ref 0.0–0.7)
Eosinophils Relative: 3.3 % (ref 0.0–5.0)
HCT: 50.3 % (ref 39.0–52.0)
Hemoglobin: 16.7 g/dL (ref 13.0–17.0)
Lymphocytes Relative: 19.4 % (ref 12.0–46.0)
Lymphs Abs: 2.1 10*3/uL (ref 0.7–4.0)
MCHC: 33.3 g/dL (ref 30.0–36.0)
MCV: 92 fl (ref 78.0–100.0)
Monocytes Absolute: 0.7 10*3/uL (ref 0.1–1.0)
Monocytes Relative: 6.1 % (ref 3.0–12.0)
Neutro Abs: 7.7 10*3/uL (ref 1.4–7.7)
Neutrophils Relative %: 69.8 % (ref 43.0–77.0)
Platelets: 240 10*3/uL (ref 150.0–400.0)
RBC: 5.47 Mil/uL (ref 4.22–5.81)
RDW: 14.7 % (ref 11.5–15.5)
WBC: 11 10*3/uL — ABNORMAL HIGH (ref 4.0–10.5)

## 2022-08-10 LAB — COMPREHENSIVE METABOLIC PANEL
ALT: 10 U/L (ref 0–53)
AST: 11 U/L (ref 0–37)
Albumin: 4.2 g/dL (ref 3.5–5.2)
Alkaline Phosphatase: 65 U/L (ref 39–117)
BUN: 26 mg/dL — ABNORMAL HIGH (ref 6–23)
CO2: 22 mEq/L (ref 19–32)
Calcium: 9.5 mg/dL (ref 8.4–10.5)
Chloride: 105 mEq/L (ref 96–112)
Creatinine, Ser: 2.49 mg/dL — ABNORMAL HIGH (ref 0.40–1.50)
GFR: 27.5 mL/min — ABNORMAL LOW (ref >60.00)
Glucose, Bld: 121 mg/dL — ABNORMAL HIGH (ref 70–99)
Potassium: 3.6 mEq/L (ref 3.5–5.1)
Sodium: 138 mEq/L (ref 135–145)
Total Bilirubin: 0.5 mg/dL (ref 0.2–1.2)
Total Protein: 7.1 g/dL (ref 6.0–8.3)

## 2022-08-11 ENCOUNTER — Other Ambulatory Visit: Payer: Self-pay

## 2022-08-11 DIAGNOSIS — K7460 Unspecified cirrhosis of liver: Secondary | ICD-10-CM

## 2022-08-11 DIAGNOSIS — B181 Chronic viral hepatitis B without delta-agent: Secondary | ICD-10-CM

## 2022-08-11 LAB — AFP TUMOR MARKER: AFP-Tumor Marker: 1.9 ng/mL (ref <6.1)

## 2022-08-16 ENCOUNTER — Other Ambulatory Visit: Payer: Self-pay

## 2022-08-16 MED ORDER — ENTECAVIR 0.5 MG PO TABS: 0.5000 mg | ORAL_TABLET | ORAL | 1 refills | Status: DC

## 2022-08-19 ENCOUNTER — Other Ambulatory Visit: Payer: Medicaid Other

## 2022-08-19 ENCOUNTER — Other Ambulatory Visit: Payer: Self-pay | Admitting: Cardiovascular Disease

## 2022-08-19 DIAGNOSIS — B181 Chronic viral hepatitis B without delta-agent: Secondary | ICD-10-CM

## 2022-08-19 DIAGNOSIS — K7460 Unspecified cirrhosis of liver: Secondary | ICD-10-CM

## 2022-08-21 LAB — HEPATITIS B DNA, ULTRAQUANTITATIVE, PCR
Hepatitis B DNA: <10 IU/mL — ABNORMAL HIGH
Hepatitis B virus DNA: <1 Log IU/mL — ABNORMAL HIGH

## 2022-08-24 ENCOUNTER — Telehealth: Payer: Self-pay | Admitting: Gastroenterology

## 2022-08-24 DIAGNOSIS — G4733 Obstructive sleep apnea (adult) (pediatric): Secondary | ICD-10-CM | POA: Diagnosis not present

## 2022-08-24 NOTE — Telephone Encounter (Signed)
Called and spoke to patient.  Relayed results of Hep B DNA - undetectable. Will repeat in 6 months. Prescription was sent last week.  Let patient know that I called the pharmacy and they said his entecavir is ready for pick up at [facility]. Confirmed he has an appointment on June 24 as a 6 Mo F/U

## 2022-08-24 NOTE — Telephone Encounter (Signed)
Patient called to discuss lab results. He also requesting to talk about the medication Entecavir, because it has not been refilled. Please advise.  Thank you.

## 2022-08-29 ENCOUNTER — Other Ambulatory Visit: Payer: Self-pay | Admitting: Cardiovascular Disease

## 2022-08-31 DIAGNOSIS — I5020 Unspecified systolic (congestive) heart failure: Secondary | ICD-10-CM | POA: Diagnosis not present

## 2022-08-31 DIAGNOSIS — J690 Pneumonitis due to inhalation of food and vomit: Secondary | ICD-10-CM | POA: Diagnosis not present

## 2022-08-31 DIAGNOSIS — J9601 Acute respiratory failure with hypoxia: Secondary | ICD-10-CM | POA: Diagnosis not present

## 2022-09-05 ENCOUNTER — Ambulatory Visit (INDEPENDENT_AMBULATORY_CARE_PROVIDER_SITE_OTHER): Payer: Medicaid Other

## 2022-09-05 DIAGNOSIS — I428 Other cardiomyopathies: Secondary | ICD-10-CM

## 2022-09-05 DIAGNOSIS — I5022 Chronic systolic (congestive) heart failure: Secondary | ICD-10-CM

## 2022-09-05 LAB — CUP PACEART REMOTE DEVICE CHECK
Battery Remaining Longevity: 120 mo
Battery Voltage: 3 V
Brady Statistic RV Percent Paced: 0.42 %
Date Time Interrogation Session: 20240506074222
HighPow Impedance: 45 Ohm
HighPow Impedance: 61 Ohm
Implantable Lead Connection Status: 753985
Implantable Lead Implant Date: 20120806
Implantable Lead Location: 753860
Implantable Lead Model: 185
Implantable Lead Serial Number: 345870
Implantable Pulse Generator Implant Date: 20210917
Lead Channel Impedance Value: 323 Ohm
Lead Channel Impedance Value: 323 Ohm
Lead Channel Pacing Threshold Amplitude: 1.25 V
Lead Channel Pacing Threshold Pulse Width: 0.4 ms
Lead Channel Sensing Intrinsic Amplitude: 7.25 mV
Lead Channel Sensing Intrinsic Amplitude: 7.25 mV
Lead Channel Setting Pacing Amplitude: 2.5 V
Lead Channel Setting Pacing Pulse Width: 0.4 ms
Lead Channel Setting Sensing Sensitivity: 0.3 mV
Zone Setting Status: 755011
Zone Setting Status: 755011

## 2022-09-06 ENCOUNTER — Ambulatory Visit: Payer: Medicaid Other | Attending: Internal Medicine

## 2022-09-06 DIAGNOSIS — Z95810 Presence of automatic (implantable) cardiac defibrillator: Secondary | ICD-10-CM

## 2022-09-06 DIAGNOSIS — I5022 Chronic systolic (congestive) heart failure: Secondary | ICD-10-CM

## 2022-09-09 ENCOUNTER — Telehealth: Payer: Self-pay

## 2022-09-09 NOTE — Telephone Encounter (Signed)
Remote ICM transmission received.  Attempted call to patient regarding ICM remote transmission and left detailed message per DPR.  Advised to return call for any fluid symptoms or questions. Next ICM remote transmission scheduled 10/17/2022.    

## 2022-09-09 NOTE — Progress Notes (Signed)
EPIC Encounter for ICM Monitoring  Patient Name: Cody Ortega is a 62 y.o. male Date: 09/09/2022 Primary Care Physican: Marcine Matar, MD Primary Cardiologist: Excell Seltzer Electrophysiologist: Ladona Ridgel 06/02/2022 Office Weight: 273 lbs    Since 05-Sep-2022 Time in AF  1.2 hr/day (5.0%)                                                            Attempted call to patient and unable to reach.  Left detailed message per DPR regarding transmission. Transmission reviewed.    Optivol thoracic impedance suggesting normal fluid levels with the exception of possible fluid accumulation from 4/10-5/1.   Prescribed:  Furosemide 40 mg take 0.5 tablet (20 mg total) by mouth daily.   Labs: 06/02/2022 Creatinine 2.52, BUN 26, Potassium 4.0, Sodium 133, GFR 29 A complete set of results can be found in Results Review.   Recommendations:  Left voice mail with ICM number and encouraged to call if experiencing any fluid symptoms.   Follow-up plan: ICM clinic phone appointment on 10/17/2022.   91 day device clinic remote transmission [id number].     EP/Cardiology Office Visits:  Recall 10/09/2022 with Dr Excell Seltzer.  09/30/2022 with Dr Elberta Fortis.   Copy of ICM check sent to Dr. Ladona Ridgel.  3 month ICM trend: 09/07/2022.    12-14 Month ICM trend:     Karie Soda, RN 09/09/2022 4:41 PM

## 2022-09-15 DIAGNOSIS — F331 Major depressive disorder, recurrent, moderate: Secondary | ICD-10-CM | POA: Diagnosis not present

## 2022-09-15 DIAGNOSIS — F332 Major depressive disorder, recurrent severe without psychotic features: Secondary | ICD-10-CM | POA: Diagnosis not present

## 2022-09-15 DIAGNOSIS — F330 Major depressive disorder, recurrent, mild: Secondary | ICD-10-CM | POA: Diagnosis not present

## 2022-09-15 DIAGNOSIS — F410 Panic disorder [episodic paroxysmal anxiety] without agoraphobia: Secondary | ICD-10-CM | POA: Diagnosis not present

## 2022-09-15 DIAGNOSIS — F639 Impulse disorder, unspecified: Secondary | ICD-10-CM | POA: Diagnosis not present

## 2022-09-19 ENCOUNTER — Encounter (HOSPITAL_COMMUNITY): Payer: Self-pay

## 2022-09-19 ENCOUNTER — Emergency Department (HOSPITAL_COMMUNITY): Payer: Medicaid Other

## 2022-09-19 ENCOUNTER — Other Ambulatory Visit: Payer: Self-pay

## 2022-09-19 ENCOUNTER — Emergency Department (HOSPITAL_COMMUNITY)
Admission: EM | Admit: 2022-09-19 | Discharge: 2022-09-19 | Disposition: A | Discharge status: Home / Self Care | Payer: Medicaid Other | Attending: Emergency Medicine | Admitting: Emergency Medicine

## 2022-09-19 DIAGNOSIS — Z7951 Long term (current) use of inhaled steroids: Secondary | ICD-10-CM | POA: Insufficient documentation

## 2022-09-19 DIAGNOSIS — M25551 Pain in right hip: Secondary | ICD-10-CM | POA: Diagnosis not present

## 2022-09-19 DIAGNOSIS — I509 Heart failure, unspecified: Secondary | ICD-10-CM | POA: Diagnosis not present

## 2022-09-19 DIAGNOSIS — W19XXXA Unspecified fall, initial encounter: Secondary | ICD-10-CM

## 2022-09-19 DIAGNOSIS — I130 Hypertensive heart and chronic kidney disease with heart failure and stage 1 through stage 4 chronic kidney disease, or unspecified chronic kidney disease: Secondary | ICD-10-CM | POA: Insufficient documentation

## 2022-09-19 DIAGNOSIS — W010XXA Fall on same level from slipping, tripping and stumbling without subsequent striking against object, initial encounter: Secondary | ICD-10-CM | POA: Diagnosis not present

## 2022-09-19 DIAGNOSIS — R269 Unspecified abnormalities of gait and mobility: Secondary | ICD-10-CM | POA: Diagnosis not present

## 2022-09-19 DIAGNOSIS — N189 Chronic kidney disease, unspecified: Secondary | ICD-10-CM | POA: Insufficient documentation

## 2022-09-19 DIAGNOSIS — Z79899 Other long term (current) drug therapy: Secondary | ICD-10-CM | POA: Diagnosis not present

## 2022-09-19 DIAGNOSIS — R262 Difficulty in walking, not elsewhere classified: Secondary | ICD-10-CM | POA: Diagnosis not present

## 2022-09-19 DIAGNOSIS — R200 Anesthesia of skin: Secondary | ICD-10-CM | POA: Diagnosis not present

## 2022-09-19 DIAGNOSIS — R2689 Other abnormalities of gait and mobility: Secondary | ICD-10-CM | POA: Diagnosis not present

## 2022-09-19 LAB — COMPREHENSIVE METABOLIC PANEL
ALT: 12 U/L (ref 0–44)
AST: 10 U/L — ABNORMAL LOW (ref 15–41)
Albumin: 4.1 g/dL (ref 3.5–5.0)
Alkaline Phosphatase: 52 U/L (ref 38–126)
Anion gap: 9 (ref 5–15)
BUN: 30 mg/dL — ABNORMAL HIGH (ref 6–20)
CO2: 22 mmol/L (ref 22–32)
Calcium: 8.6 mg/dL — ABNORMAL LOW (ref 8.9–10.3)
Chloride: 105 mmol/L (ref 98–111)
Creatinine, Ser: 2.43 mg/dL — ABNORMAL HIGH (ref 0.61–1.24)
GFR, Estimated: 30 mL/min — ABNORMAL LOW (ref >60)
Glucose, Bld: 80 mg/dL (ref 70–99)
Potassium: 3.5 mmol/L (ref 3.5–5.1)
Sodium: 136 mmol/L (ref 135–145)
Total Bilirubin: 0.5 mg/dL (ref 0.3–1.2)
Total Protein: 7.2 g/dL (ref 6.5–8.1)

## 2022-09-19 LAB — CBC WITH DIFFERENTIAL/PLATELET
Abs Immature Granulocytes: 0.02 10*3/uL (ref 0.00–0.07)
Basophils Absolute: 0.1 10*3/uL (ref 0.0–0.1)
Basophils Relative: 1 %
Eosinophils Absolute: 0.3 10*3/uL (ref 0.0–0.5)
Eosinophils Relative: 3 %
HCT: 44.5 % (ref 39.0–52.0)
Hemoglobin: 14.8 g/dL (ref 13.0–17.0)
Immature Granulocytes: 0 %
Lymphocytes Relative: 20 %
Lymphs Abs: 1.7 10*3/uL (ref 0.7–4.0)
MCH: 31.4 pg (ref 26.0–34.0)
MCHC: 33.3 g/dL (ref 30.0–36.0)
MCV: 94.3 fL (ref 80.0–100.0)
Monocytes Absolute: 0.7 10*3/uL (ref 0.1–1.0)
Monocytes Relative: 8 %
Neutro Abs: 5.7 10*3/uL (ref 1.7–7.7)
Neutrophils Relative %: 68 %
Platelets: 196 10*3/uL (ref 150–400)
RBC: 4.72 MIL/uL (ref 4.22–5.81)
RDW: 13.9 % (ref 11.5–15.5)
WBC: 8.4 10*3/uL (ref 4.0–10.5)
nRBC: 0 % (ref 0.0–0.2)

## 2022-09-19 LAB — URINALYSIS, ROUTINE W REFLEX MICROSCOPIC
Bacteria, UA: NONE SEEN
Bilirubin Urine: NEGATIVE
Glucose, UA: 150 mg/dL — AB
Ketones, ur: NEGATIVE mg/dL
Leukocytes,Ua: NEGATIVE
Nitrite: NEGATIVE
Protein, ur: NEGATIVE mg/dL
Specific Gravity, Urine: 1.004 — ABNORMAL LOW (ref 1.005–1.030)
pH: 5 (ref 5.0–8.0)

## 2022-09-19 NOTE — ED Triage Notes (Signed)
Pt. Arrives POV for a fall that happened about a week and a half ago. Pt. Denies hitting his head or losing consciousness. He does not take blood thinners. Pt. States that since then, he has had trouble walking due to numbness of the r. Hip. He has also states that he has had surprise bowel movements and urinary incontinence.

## 2022-09-19 NOTE — ED Notes (Signed)
Pt has medtronic pacemaker per pt.

## 2022-09-19 NOTE — ED Notes (Signed)
Pt did not sign ortho form, walked out upon d/c.

## 2022-09-19 NOTE — ED Provider Notes (Signed)
[place] EMERGENCY DEPARTMENT AT [facility] Provider Note   CSN: [id number] Arrival date & time: 09/19/22  0129     History  Chief Complaint  Patient presents with   Cody Ortega is a 62 y.o. male.  62 year old male with past medical history of CHF, hypertension, asthma, anxiety, depression, CKD, anemia, cardiomyopathy, CVA, AICD in place, hepatitis B, cirrhosis, hyperlipidemia presents with complaint of right hip pain.  Patient states that he fell a little over a week and a half ago when he tripped on oxygen tubing, twisted and fell landing on his right side.  Patient was able to stand up immediately after the fall and was ambulatory without difficulty although noticed some discomfort in his hip.  Patient tried to rest and ice for a few days however notes that he is now having worsening pain in the right hip and difficulty walking.  Notes that he has difficulty making it to the bathroom on time to empty his bladder and has been incontinent of stools at times.  He denies saddle paresthesias or abdominal pain.  No other complaints or concerns tonight.       Home Medications Prior to Admission medications   Medication Sig Start Date End Date Taking? Authorizing Provider  acetaminophen (TYLENOL) 500 MG tablet Take 1,000 mg by mouth every 6 (six) hours as needed for moderate pain or headache.    [provider]  albuterol (VENTOLIN HFA) 108 (90 Base) MCG/ACT inhaler Inhale 2 puffs into the lungs every 6 (six) hours as needed for wheezing or shortness of breath. 08/12/21   Marcine Matar, MD  alprazolam Prudy Feeler) 2 MG tablet Take 2 mg by mouth daily as needed for anxiety.    [provider]  amiodarone (PACERONE) 200 MG tablet Take 1 tablet (200 mg total) by mouth daily. 12/09/21   Camnitz, Andree Coss, MD  Artificial Tear Solution (TEARS NATURALE OP) Place 1 drop into both eyes daily as needed (dry eyes).    [provider]  aspirin EC  81 MG tablet Take 81 mg by mouth daily. Swallow whole.    [provider]  atorvastatin (LIPITOR) 10 MG tablet TAKE 1 TABLET(10 MG) BY MOUTH DAILY 08/19/22   Tonny Bollman, MD  carvedilol (COREG) 12.5 MG tablet Take 1.5 tabs PO twice a day PO 08/12/21   Marcine Matar, MD  dapagliflozin propanediol (FARXIGA) 10 MG TABS tablet Take 1 tablet (10 mg total) by mouth daily before breakfast. 04/14/22   Tonny Bollman, MD  DULoxetine (CYMBALTA) 30 MG capsule Take 90 mg by mouth daily with breakfast.    [provider]  entecavir (BARACLUDE) 0.5 MG tablet Take 1 tablet (0.5 mg total) by mouth every other day. 08/16/22   Armbruster, Willaim Rayas, MD  ENTRESTO 49-51 MG TAKE 1 TABLET BY MOUTH TWICE DAILY 02/15/22   Tonny Bollman, MD  ezetimibe (ZETIA) 10 MG tablet TAKE 1 TABLET(10 MG) BY MOUTH DAILY 08/29/22   Tonny Bollman, MD  fluticasone [facility]) 50 MCG/ACT nasal spray SHAKE LIQUID AND USE 1 SPRAY IN [facility] NOSTRIL DAILY Patient not taking: Reported on 06/28/2022 08/12/21   Marcine Matar, MD  furosemide (LASIX) 40 MG tablet TAKE 1/2 TABLET(20 MG) BY MOUTH DAILY 12/29/21   Tonny Bollman, MD  hydrALAZINE (APRESOLINE) 100 MG tablet TAKE 1 TABLET(100 MG) BY MOUTH IN THE MORNING AND AT BEDTIME 04/26/22   Nahser, Deloris Ping, MD  ondansetron (ZOFRAN-ODT) 8 MG disintegrating tablet Take 8  mg by mouth daily. 12/11/19   [provider]  oxybutynin (DITROPAN) 5 MG tablet TAKE 1 TABLET(5 MG) BY MOUTH THREE TIMES DAILY 04/08/22   Marcine Matar, MD  polyethylene glycol (MIRALAX) 17 g packet Take 17 g by mouth 2 (two) times daily. Take as directed, Daily, titrate as needed 04/08/22   Armbruster, Willaim Rayas, MD  Rectal Protectant-Emollient (CALMOL-4) 76-10 % SUPP Use as directed once to twice daily Patient not taking: Reported on 06/28/2022 04/08/22   Benancio Deeds, MD  RESTASIS 0.05 % ophthalmic emulsion Place 1 drop into both eyes in the morning. 11/25/21   [provider]   sildenafil (REVATIO) 20 MG tablet Take 1 tablet (20 mg total) by mouth as needed. 12/03/21   Tonny Bollman, MD  SYMBICORT 160-4.5 MCG/ACT inhaler INHALE 2 PUFFS INTO THE LUNGS TWICE DAILY 11/10/21   Marcine Matar, MD  tamsulosin (FLOMAX) 0.4 MG CAPS capsule TAKE 1 CAPSULE(0.4 MG) BY MOUTH DAILY 11/02/21   Marcine Matar, MD  XARELTO 20 MG TABS tablet TAKE 1 TABLET(20 MG) BY MOUTH DAILY WITH SUPPER 03/14/22   Camnitz, Andree Coss, MD      Allergies    Prochlorperazine, Codeine, and Hydrocodone-acetaminophen    Review of Systems   Review of Systems Negative except as per HPI Physical Exam Updated Vital Signs BP (!) 162/100   Pulse 64   Temp 98.2 F (36.8 C) (Oral)   Resp 19   Ht 6\' 3"  (1.905 m)   Wt 124.7 kg   SpO2 96%   BMI 34.37 kg/m  Physical Exam Vitals and nursing note reviewed. Exam conducted with a chaperone present.  Constitutional:      General: He is not in acute distress.    Appearance: He is well-developed. He is not diaphoretic.  HENT:     Head: Normocephalic and atraumatic.  Cardiovascular:     Pulses: Normal pulses.  Pulmonary:     Effort: Pulmonary effort is normal.  Abdominal:     Palpations: Abdomen is soft.     Tenderness: There is no abdominal tenderness.  Genitourinary:    Comments: Rectal tone normal Musculoskeletal:        General: No swelling, tenderness or deformity.     Lumbar back: No tenderness or bony tenderness.     Right lower leg: No edema.     Left lower leg: No edema.     Comments: No pain with logroll of hips  Skin:    General: Skin is warm and dry.     Findings: No erythema or rash.  Neurological:     Mental Status: He is alert and oriented to person, place, and time.     Sensory: No sensory deficit.     Gait: Gait abnormal.     Deep Tendon Reflexes:     Reflex Scores:      Patellar reflexes are 2+ on the right side and 2+ on the left side.    Comments: Right hip flexion slightly weak compared to left. Right leg with  ?muscle atrophy of the calf muscles compared to left  Psychiatric:        Behavior: Behavior normal.     ED Results / Procedures / Treatments   Labs (all labs ordered are listed, but only abnormal results are displayed) Labs Reviewed  COMPREHENSIVE METABOLIC PANEL - Abnormal; Notable for the following components:      Result Value   BUN 30 (*)    Creatinine, Ser 2.43 (*)  Calcium 8.6 (*)    AST 10 (*)    GFR, Estimated 30 (*)    All other components within normal limits  URINALYSIS, ROUTINE W REFLEX MICROSCOPIC - Abnormal; Notable for the following components:   Color, Urine STRAW (*)    Specific Gravity, Urine 1.004 (*)    Glucose, UA 150 (*)    Hgb urine dipstick SMALL (*)    All other components within normal limits  CBC WITH DIFFERENTIAL/PLATELET    EKG None  Radiology CT Lumbar Spine Wo Contrast  Result Date: 09/19/2022 CLINICAL DATA:  62 year old male status post fall 1.5 weeks ago. Subsequent numbness, difficulty walking, incontinence. EXAM: CT LUMBAR SPINE WITHOUT CONTRAST TECHNIQUE: Multidetector CT imaging of the lumbar spine was performed without intravenous contrast administration. Multiplanar CT image reconstructions were also generated. RADIATION DOSE REDUCTION: This exam was performed according to the departmental dose-optimization program which includes automated exposure control, adjustment of the mA and/or kV according to patient size and/or use of iterative reconstruction technique. COMPARISON:  CT thoracic spine today. CT Abdomen and Pelvis 05/21/2017. FINDINGS: Segmentation: Transitional anatomy. As seen on the thoracic spine CT there are full size ribs at L1 and S1 is fully lumbarized. S1-S2 disc space is open. Correlation with radiographs is recommended prior to any operative intervention. Alignment: Stable lumbar lordosis since 2019. No significant scoliosis or spondylolisthesis. Vertebrae: Maintained lumbar vertebral height. Chronic degenerative endplate  changes at S1-S2. Background bone mineralization is normal. Lumbar vertebrae, visible sacrum, SI joints, and pelvis appear intact. Paraspinal and other soft tissues: Extensive calcified atherosclerosis throughout the visible abdomen and pelvis. Aortoiliac calcified atherosclerosis. Normal caliber abdominal aorta. Otherwise negative visible noncontrast abdominal and pelvic viscera; small chronic benign lipoma of the duodenum Series 13, image 23). Lumbar paraspinal soft tissues are within normal limits. Disc levels: Full size ribs at both T12 and L1. Widespread lumbar disc bulging. But no significant spinal stenosis above L3-L4. L3-L4: Moderate circumferential disc bulge and mild posterior element hypertrophy combine for possible mild spinal stenosis. L4-L5: Less pronounced disc bulging here. No significant spinal stenosis. L5-S1: Circumferential disc bulge and moderate posterior element hypertrophy. Mild spinal stenosis suspected, similar to the level above. S1-S2: Fully lumbarized. Chronic severe disc space loss and vacuum disc. Chronic endplate sclerosis here. Bulky circumferential disc osteophyte complex. Broad-based posterior component. But the neural foramina are most affected. Probably no significant spinal stenosis (series 13, image 120). Where as severe bilateral S1 neural foraminal stenosis appears chronic and stable. IMPRESSION: 1. No acute osseous abnormality in the lumbar spine. Transitional anatomy: Hypoplastic ribs at T1, full size ribs at L1, and fully lumbarized S1-S2 level.Correlation with radiographs is recommended prior to any operative intervention. 2. Lumbar spine degeneration with mild degenerative spinal stenosis suspected at L3-L4, L5-S1. 3. Chronic severe disc and endplate degeneration at S1-S2 with chronic severe bilateral S1 neural foraminal stenosis. 4. Extensive calcified atherosclerosis in the abdomen and pelvis. Aortic Atherosclerosis (ICD10-I70.0). Electronically Signed   By: Odessa Fleming  M.D.   On: 09/19/2022 04:55   CT Thoracic Spine Wo Contrast  Result Date: 09/19/2022 CLINICAL DATA:  62 year old male status post fall 1.5 weeks ago. Subsequent numbness, difficulty walking, incontinence. EXAM: CT THORACIC SPINE WITHOUT CONTRAST TECHNIQUE: Multidetector CT images of the thoracic were obtained using the standard protocol without intravenous contrast. RADIATION DOSE REDUCTION: This exam was performed according to the departmental dose-optimization program which includes automated exposure control, adjustment of the mA and/or kV according to patient size and/or use of iterative reconstruction technique. COMPARISON:  CT cervical spine today. Chest CT without contrast 10/05/2020. FINDINGS: Limited cervical spine imaging: Reported separately. Cervicothoracic junction alignment is within normal limits. Thoracic spine segmentation: Hypoplastic ribs at T1 but full size ribs at L1. Correlation with radiographs is recommended prior to any operative intervention. Alignment: Maintained, stable thoracic lordosis since the 2022 chest CT. No significant scoliosis or spondylolisthesis. Vertebrae: Stable thoracic vertebral height since 2022. Very mild and possibly congenital wedging of the lower thoracic vertebral bodies, also L1. Visible posterior ribs appear intact. No acute osseous abnormality identified. Paraspinal and other soft tissues: Chronic left chest AICD. Major airways are patent. Bilateral abnormal lung opacity seen in 2022 does not persist. Partially visible cardiomegaly. No evidence of pericardial or pleural effusion. No visible mediastinal mass or lymphadenopathy. Advanced calcified atherosclerosis in the visible chest and upper abdomen. Calcified aortic atherosclerosis. Thoracic paraspinal soft tissues are within normal limits. Disc levels: Generally mild for age thoracic spine degeneration. No CT evidence of spinal stenosis above T10-T11. T10-T11: Circumferential disc bulge with broad-based  posterior component suspected (series 13, image 128). Evidence of mild spinal stenosis. T11-T12: Disc bulging eccentric to the left. But probably no significant spinal stenosis at this level. T12-L1: Anterior and lateral disc and endplate degeneration. No spinal stenosis. IMPRESSION: 1. No acute osseous abnormality in the thoracic spine. Transitional anatomy with hypoplastic ribs at T1, full size ribs at L1. Lumbar spine is reported separately. 2. Generally mild for age thoracic spine degeneration. Suspect degenerative spinal stenosis at T10-T11, mild and in large part due to disc disease. Probably no other significant thoracic spinal stenosis. 3. Chronic left chest AICD. Widespread severe calcified atherosclerosis. Aortic Atherosclerosis (ICD10-I70.0). Electronically Signed   By: Odessa Fleming M.D.   On: 09/19/2022 04:39   CT Cervical Spine Wo Contrast  Result Date: 09/19/2022 CLINICAL DATA:  62 year old male status post fall 1.5 weeks ago. Subsequent numbness, difficulty walking, incontinence. EXAM: CT CERVICAL SPINE WITHOUT CONTRAST TECHNIQUE: Multidetector CT imaging of the cervical spine was performed without intravenous contrast. Multiplanar CT image reconstructions were also generated. RADIATION DOSE REDUCTION: This exam was performed according to the departmental dose-optimization program which includes automated exposure control, adjustment of the mA and/or kV according to patient size and/or use of iterative reconstruction technique. COMPARISON:  Head CT today.  Cervical spine CT 07/19/2010. FINDINGS: Alignment: Chronic straightening of cervical lordosis. Cervicothoracic junction alignment is within normal limits. Bilateral posterior element alignment is within normal limits. Skull base and vertebrae: Bone mineralization is within normal limits. Visualized skull base is intact. No atlanto-occipital dissociation. C1 and C2 appear intact and aligned. No acute osseous abnormality identified. Soft tissues and  spinal canal: No prevertebral fluid or swelling. No visible canal hematoma. Very severe calcified atherosclerosis in the neck affecting both carotid and cervical vertebral arteries. The degree of calcification at the right ICA origin suggests high-grade stenosis or occlusion. Disc levels: Generally mild for age cervical spine degeneration which does not appear significantly changed from 2012. Posterior disc bulging most pronounced at C4-C5. Mild chronic spinal stenosis suspected there. Upper chest: Left chest pacemaker type lead partially visible. Lung apices are clear. Hypoplastic right 1st rib, normal variant. Thoracic spine is detailed separately. IMPRESSION: 1. No acute traumatic injury identified in the cervical spine. 2. Generally mild for age cervical spine degeneration, and probably not significantly progressed from 2012. Mild chronic spinal stenosis related to disc bulging suspected at C4-C5. 3. Very Severe calcified atherosclerosis. Involvement of the Right ICA origin suggests high-grade stenosis or chronic occlusion there. Electronically  Signed   By: Odessa Fleming M.D.   On: 09/19/2022 04:32   CT Head Wo Contrast  Result Date: 09/19/2022 CLINICAL DATA:  62 year old male status post fall 1.5 weeks ago. Subsequent numbness, difficulty walking, incontinence. EXAM: CT HEAD WITHOUT CONTRAST TECHNIQUE: Contiguous axial images were obtained from the base of the skull through the vertex without intravenous contrast. RADIATION DOSE REDUCTION: This exam was performed according to the departmental dose-optimization program which includes automated exposure control, adjustment of the mA and/or kV according to patient size and/or use of iterative reconstruction technique. COMPARISON:  Head CT 09/25/2020. FINDINGS: Brain: Chronic asymmetric volume loss in the right hemisphere appears more pronounced than in 10/06/20, associated chronic cortical encephalomalacia in the lateral occipital pole, PCA territory. Evidence of  chronic right thalamic lacunar infarct which could be related. Bilateral basal ganglia heterogeneity is also chronic, including involvement of the right caudate. And there is questionable chronic encephalomalacia along the posterior right insula. Contralateral smaller area of chronic encephalomalacia in the left occipital pole. No midline shift, ventriculomegaly, mass effect, evidence of mass lesion, intracranial hemorrhage or evidence of cortically based acute infarction. Vascular: Severe Calcified atherosclerosis at the skull base. No suspicious intracranial vascular hyperdensity. Skull: No acute osseous abnormality identified. Sinuses/Orbits: Chronic maxillary sinus disease, improved on the left since 10-06-20, somewhat progressed on the right. Other Visualized paranasal sinuses and mastoids are stable and well aerated. Other: No recent orbit or scalp soft tissue injury identified. IMPRESSION: 1. No acute intracranial abnormality or recent traumatic injury identified. 2. Chronic ischemic disease, especially in the PCA territories and worse on the right. Chronic bilateral basal ganglia small vessel disease. Severe Calcified atherosclerosis at the skull base. Electronically Signed   By: Odessa Fleming M.D.   On: 09/19/2022 04:28   DG Hip Unilat With Pelvis 2-3 Views Right  Result Date: 09/19/2022 CLINICAL DATA:  Fall, right hip pain EXAM: DG HIP (WITH OR WITHOUT PELVIS) 2-3V RIGHT COMPARISON:  02/25/2015 FINDINGS: Normal alignment. No acute fracture or dislocation. Mild bilateral degenerative hip arthritis, left greater than right. Vascular calcifications are seen bilaterally. IMPRESSION: 1. Mild bilateral degenerative hip arthritis, left greater than right. Electronically Signed   By: Helyn Numbers M.D.   On: 09/19/2022 02:49    Procedures Procedures    Medications Ordered in ED Medications - No data to display  ED Course/ Medical Decision Making/ A&P                             Medical Decision  Making Amount and/or Complexity of Data Reviewed Labs: ordered. Radiology: ordered.   This patient presents to the ED for concern of fall, right hip pain, gait disturbance, this involves an extensive number of treatment options, and is a complaint that carries with it a high risk of complications and morbidity.  The differential diagnosis includes but not limited to hip fracture, traumatic injury, cauda equina   Co morbidities that complicate the patient evaluation  As reviewed per HPI   Additional history obtained:  External records from outside source obtained and reviewed including prior labs on file for comparison   Lab Tests:  I Ordered, and personally interpreted labs.  The pertinent results include: CBC with creatinine 2.43, on trend with prior, known kidney disease.  Urinalysis with small hemoglobin, otherwise unremarkable.  CBC within normal notes.   Imaging Studies ordered:  I ordered imaging studies including CT head, C-spine, T-spine, L-spine as well as x-ray of  the right hip I independently visualized and interpreted imaging which showed x-ray right hip without acute bony abnormality I agree with the radiologist interpretation.  Radiologist interpretation of CT images reviewed.   Consultations Obtained:  I requested consultation with the ER attending, Dr. Madilyn Hook,  and discussed lab and imaging findings as well as pertinent plan - they recommend: Follow-up with PCP   Problem List / ED Course / Critical interventions / Medication management  62 year old male presents with complaint as above.  On exam, reflexes intact, does have weak right hip flexion, question atrophy of right calf muscles, gait abnormal favoring right leg.  Rectal tone is normal, postvoid residuals unremarkable.  Exam does not suggest cauda equina which was considered due to report of incontinence of stool.  He does not have saddle paresthesias.  No other traumatic source identified regarding his fall  to explain his symptoms.  He is provided with a walker and referred to PCP for further workup. Post void residual of 25cc.  I have reviewed the patients home medicines and have made adjustments as needed   Social Determinants of Health:  Lives with spouse   Test / Admission - Considered:  Consider MRI however due to defibrillator, unable to have MRI, CT imaging completed         Final Clinical Impression(s) / ED Diagnoses Final diagnoses:  Fall, initial encounter  Gait disturbance  Pain of right hip    Rx / DC Orders ED Discharge Orders     None         Alden Hipp 09/19/22 1610    Tilden Fossa, MD 09/19/22 [phone]

## 2022-09-19 NOTE — ED Notes (Signed)
Post Void Residual via Bladder Scan  25mL

## 2022-09-19 NOTE — Discharge Instructions (Signed)
Follow up with your doctor, call today to schedule an appointment. Return to the ER for worsening or concerning symptoms.

## 2022-09-20 ENCOUNTER — Telehealth: Payer: Self-pay | Admitting: Licensed Clinical Social Worker

## 2022-09-20 NOTE — Transitions of Care (Post Inpatient/ED Visit) (Signed)
   09/20/2022  Name: HUXLEE RAGO MRN: [id number] DOB: [birth year]  Today's TOC FU Call Status: Today's TOC FU Call Status:: Unsuccessul Call (1st Attempt) Unsuccessful Call (1st Attempt) Date: 09/20/22  Transition Care Management Follow-up Telephone Call Date of Discharge: 09/19/22 Discharge Facility: [facility]) Type of Discharge: Emergency Department  Dickie La, BSW, MSW, LCSW Managed Medicaid LCSW [facility]  [facility] [email or web address] Phone: [phone]

## 2022-09-28 ENCOUNTER — Other Ambulatory Visit: Payer: Self-pay | Admitting: Cardiology

## 2022-09-28 DIAGNOSIS — I480 Paroxysmal atrial fibrillation: Secondary | ICD-10-CM

## 2022-09-28 NOTE — Telephone Encounter (Signed)
Prescription refill request for Xarelto received.  Indication:afib Last office visit:2/24 Weight:124.7  kg Age:62 Scr:2.4 5/24 CrCl:57.02  ml/min  Prescription refilled

## 2022-09-30 ENCOUNTER — Encounter: Payer: Self-pay | Admitting: Cardiology

## 2022-09-30 ENCOUNTER — Ambulatory Visit: Payer: Medicaid Other | Attending: Cardiology | Admitting: Cardiology

## 2022-09-30 VITALS — BP 124/84 | HR 61 | Ht 75.0 in | Wt 264.0 lb

## 2022-09-30 DIAGNOSIS — Z79899 Other long term (current) drug therapy: Secondary | ICD-10-CM

## 2022-09-30 DIAGNOSIS — D6869 Other thrombophilia: Secondary | ICD-10-CM | POA: Diagnosis not present

## 2022-09-30 DIAGNOSIS — I5022 Chronic systolic (congestive) heart failure: Secondary | ICD-10-CM

## 2022-09-30 DIAGNOSIS — I4819 Other persistent atrial fibrillation: Secondary | ICD-10-CM | POA: Diagnosis not present

## 2022-09-30 LAB — CUP PACEART INCLINIC DEVICE CHECK
Battery Remaining Longevity: 119 mo
Battery Voltage: 3.02 V
Brady Statistic RV Percent Paced: 0.45 %
Date Time Interrogation Session: 20240531122650
HighPow Impedance: 43 Ohm
HighPow Impedance: 62 Ohm
Implantable Lead Connection Status: 753985
Implantable Lead Implant Date: 20120806
Implantable Lead Location: 753860
Implantable Lead Model: 185
Implantable Lead Serial Number: 345870
Implantable Pulse Generator Implant Date: 20210917
Lead Channel Impedance Value: 323 Ohm
Lead Channel Impedance Value: 342 Ohm
Lead Channel Pacing Threshold Amplitude: 1.5 V
Lead Channel Pacing Threshold Pulse Width: 0.4 ms
Lead Channel Sensing Intrinsic Amplitude: 7.125 mV
Lead Channel Sensing Intrinsic Amplitude: 7.5 mV
Lead Channel Setting Pacing Amplitude: 3 V
Lead Channel Setting Pacing Pulse Width: 0.4 ms
Lead Channel Setting Sensing Sensitivity: 0.3 mV
Zone Setting Status: 755011
Zone Setting Status: 755011

## 2022-09-30 NOTE — Patient Instructions (Addendum)
Medication Instructions:  Your physician recommends that you continue on your current medications as directed. Please refer to the Current Medication list given to you today.  *If you need a refill on your cardiac medications before your next appointment, please call your pharmacy*   Lab Work: TSH today If you have labs (blood work) drawn today and your tests are completely normal, you will receive your results only by: MyChart Message (if you have MyChart) OR A paper copy in the mail If you have any lab test that is abnormal or we need to change your treatment, we will call you to review the results.   Testing/Procedures: None ordered   Follow-Up: At [facility], you and your health needs are our priority.  As part of our continuing mission to provide you with exceptional heart care, we have created designated Provider Care Teams.  These Care Teams include your primary Cardiologist (physician) and Advanced Practice Providers (APPs -  Physician Assistants and Nurse Practitioners) who all work together to provide you with the care you need, when you need it.  We recommend signing up for the patient portal called "MyChart".  Sign up information is provided on this After Visit Summary.  MyChart is used to connect with patients for Virtual Visits (Telemedicine).  Patients are able to view lab/test results, encounter notes, upcoming appointments, etc.  Non-urgent messages can be sent to your provider as well.   To learn more about what you can do with MyChart, go to [email or web address].    Your next appointment:   6 month(s)  The format for your next appointment:   In Person  Provider:   Loman Brooklyn, MD    Thank you for choosing [facility]!!   Dory Horn, RN [phone]  Other Instructions

## 2022-09-30 NOTE — Progress Notes (Signed)
Electrophysiology Office Note   Date:  09/30/2022   ID:  Cody Ortega, DOB [birth year], MRN [id number]  PCP:  Cody Matar, MD  Cardiologist:  Excell Seltzer Primary Electrophysiologist: Cody Manns, MD    Chief Complaint: AF   History of Present Illness: Cody Ortega is a 62 y.o. male who is being seen today for the evaluation of AF at the request of Cody Matar, MD. Presenting today for electrophysiology evaluation.  He has a history significant for coronary artery disease, chronic systolic heart failure, cirrhosis, hepatitis B, atrial fibrillation, hypertension, sleep apnea, CKD stage IV.  He is currently on amiodarone.  He previously had plans for atrial fibrillation ablation.  He canceled the ablation as his father was having health issues.  His father actually passed away 2 days ago.  He also had a fall few weeks ago and hurt his hip.  He remains on amiodarone and is in sinus rhythm.  Today, denies symptoms of palpitations, chest pain, shortness of breath, orthopnea, PND, lower extremity edema, claudication, dizziness, presyncope, syncope, bleeding, or neurologic sequela. The patient is tolerating medications without difficulties.      Past Medical History:  Diagnosis Date   Anxiety    Arthritis    Benzodiazepine dependence (HCC)    Bronchial asthma    CAD (coronary artery disease)    a. Cath 03/2010: mod RCA stenosis, severe diag stenosis, treated medically given lack of angina.   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 Episode in 2008 with spontaneous conversion, on amiodarone per EP.   S/P implantation of automatic cardioverter/defibrillator (AICD)    a. Medtronic, implanted 2012.   Sleep apnea    Stroke [facility]) 2004   Thyroid disease    Past Surgical History:  Procedure Laterality Date   CARDIOVERSION N/A 01/07/2014   Procedure: CARDIOVERSION;  Surgeon: Lewayne Bunting, MD;  Location: [facility] ENDOSCOPY;  Service: Cardiovascular;  Laterality: N/A;   CARDIOVERSION N/A 02/11/2014   Procedure: CARDIOVERSION;  Surgeon: Chrystie Nose, MD;  Location: [facility] ENDOSCOPY;  Service: Cardiovascular;  Laterality: N/A;   CARDIOVERSION N/A 10/01/2020   Procedure: CARDIOVERSION;  Surgeon: Christell Constant, MD;  Location: [facility] ENDOSCOPY;  Service: Cardiovascular;  Laterality: N/A;   CATARACT EXTRACTION     COLONOSCOPY WITH PROPOFOL N/A 02/06/2018   Procedure: COLONOSCOPY WITH PROPOFOL;  Surgeon: Benancio Deeds, MD;  Location: [facility] ENDOSCOPY;  Service: Gastroenterology;  Laterality: N/A;   EYE SURGERY     ICD GENERATOR CHANGEOUT N/A 01/17/2020   Procedure: ICD GENERATOR CHANGEOUT;  Surgeon: Marinus Maw, MD;  Location: [facility] INVASIVE CV LAB;  Service: Cardiovascular;  Laterality: N/A;   LASEK eye surgery Bilateral 05/2018   LASIK Bilateral    PACEMAKER INSERTION     POLYPECTOMY  02/06/2018   Procedure: POLYPECTOMY;  Surgeon: Benancio Deeds, MD;  Location: [facility] ENDOSCOPY;  Service: Gastroenterology;;   TEE WITHOUT  CARDIOVERSION N/A 01/07/2014   Procedure: TRANSESOPHAGEAL ECHOCARDIOGRAM (TEE);  Surgeon: Lewayne Bunting, MD;  Location: [facility] ENDOSCOPY;  Service: Cardiovascular;  Laterality: N/A;   TEE WITHOUT CARDIOVERSION N/A 02/11/2014   Procedure: TRANSESOPHAGEAL ECHOCARDIOGRAM (TEE);  Surgeon: Chrystie Nose, MD;  Location: [facility] ENDOSCOPY;  Service: Cardiovascular;  Laterality: N/A;   TEE WITHOUT CARDIOVERSION N/A 10/01/2020   Procedure: TRANSESOPHAGEAL ECHOCARDIOGRAM  (TEE);  Surgeon: Christell Constant, MD;  Location: [facility] ENDOSCOPY;  Service: Cardiovascular;  Laterality: N/A;   TONSILLECTOMY     TRANSTHORACIC ECHOCARDIOGRAM  10/2006, 12/2009     Current Outpatient Medications  Medication Sig Dispense Refill   acetaminophen (TYLENOL) 500 MG tablet Take 1,000 mg by mouth every 6 (six) hours as needed for moderate pain or headache.     albuterol (VENTOLIN HFA) 108 (90 Base) MCG/ACT inhaler Inhale 2 puffs into the lungs every 6 (six) hours as needed for wheezing or shortness of breath. 8.5 g 6   alprazolam (XANAX) 2 MG tablet Take 2 mg by mouth daily as needed for anxiety.     amiodarone (PACERONE) 200 MG tablet Take 1 tablet (200 mg total) by mouth daily. 90 tablet 2   Artificial Tear Solution (TEARS NATURALE OP) Place 1 drop into both eyes daily as needed (dry eyes).     aspirin EC 81 MG tablet Take 81 mg by mouth daily. Swallow whole.     atorvastatin (LIPITOR) 10 MG tablet TAKE 1 TABLET(10 MG) BY MOUTH DAILY 90 tablet 0   carvedilol (COREG) 12.5 MG tablet Take 1.5 tabs PO twice a day PO 270 tablet 3   dapagliflozin propanediol (FARXIGA) 10 MG TABS tablet Take 1 tablet (10 mg total) by mouth daily before breakfast. 90 tablet 3   DULoxetine (CYMBALTA) 30 MG capsule Take 90 mg by mouth daily with breakfast.     entecavir (BARACLUDE) 0.5 MG tablet Take 1 tablet (0.5 mg total) by mouth every other day. 45 tablet 1   ENTRESTO 49-51 MG TAKE 1 TABLET BY MOUTH TWICE DAILY 180 tablet 1   ezetimibe (ZETIA) 10 MG tablet TAKE 1 TABLET(10 MG) BY MOUTH DAILY 90 tablet 2   fluticasone (FLONASE) 50 MCG/ACT nasal spray SHAKE LIQUID AND USE 1 SPRAY IN EACH NOSTRIL DAILY 16 g 4   furosemide (LASIX) 40 MG tablet TAKE 1/2 TABLET(20 MG) BY MOUTH DAILY 45 tablet 3   hydrALAZINE (APRESOLINE) 100 MG tablet TAKE 1 TABLET(100 MG) BY MOUTH IN THE MORNING AND AT BEDTIME 180 tablet 3   ondansetron (ZOFRAN-ODT) 8 MG disintegrating tablet Take 8 mg by mouth daily.     oxybutynin  (DITROPAN) 5 MG tablet TAKE 1 TABLET(5 MG) BY MOUTH THREE TIMES DAILY 90 tablet 0   polyethylene glycol (MIRALAX) 17 g packet Take 17 g by mouth 2 (two) times daily. Take as directed, Daily, titrate as needed 14 each 0   Rectal Protectant-Emollient (CALMOL-4) 76-10 % SUPP Use as directed once to twice daily 4 suppository 0   RESTASIS 0.05 % ophthalmic emulsion Place 1 drop into both eyes in the morning.     sildenafil (REVATIO) 20 MG tablet Take 1 tablet (20 mg total) by mouth as needed. 90 tablet 0   SYMBICORT 160-4.5 MCG/ACT inhaler INHALE 2 PUFFS INTO THE LUNGS TWICE DAILY 10.2 g 2   tamsulosin (FLOMAX) 0.4 MG CAPS capsule TAKE 1 CAPSULE(0.4 MG) BY MOUTH DAILY 90 capsule 1   XARELTO 20 MG TABS tablet TAKE 1 TABLET(20 MG) BY MOUTH DAILY WITH SUPPER 90 tablet  1   zaleplon (SONATA) 10 MG capsule Take 10 mg by mouth at bedtime.     No current facility-administered medications for this visit.    Allergies:   Prochlorperazine, Codeine, and Hydrocodone-acetaminophen   Social History:  The patient  reports that he quit smoking about 12 years ago. His smoking use included cigarettes. He has a 0.25 pack-year smoking history. He has never used smokeless tobacco. He reports that he does not currently use alcohol. He reports that he does not use drugs.   Family History:  The patient's family history includes Breast cancer in his mother; Cerebral palsy in his daughter; Diabetes in his father; Multiple myeloma in his mother; Peripheral vascular disease in his father; Prostate cancer in his father.   ROS:  Please see the history of present illness.   Otherwise, review of systems is positive for none.   All other systems are reviewed and negative.   PHYSICAL EXAM: VS:  BP 124/84   Pulse 61   Ht 6\' 3"  (1.905 m)   Wt 264 lb (119.7 kg)   SpO2 99%   BMI 33.00 kg/m  , BMI Body mass index is 33 kg/m. GEN: Well nourished, well developed, in no acute distress  HEENT: normal  Neck: no JVD, carotid bruits,  or masses Cardiac: RRR; no murmurs, rubs, or gallops,no edema  Respiratory:  clear to auscultation bilaterally, normal work of breathing GI: soft, nontender, nondistended, + BS MS: no deformity or atrophy  Skin: warm and dry, device site well healed Neuro:  Strength and sensation are intact Psych: euthymic mood, full affect  EKG:  EKG is ordered today. Personal review of the ekg ordered shows sinus rhythm, PACs  Personal review of the device interrogation today. Results in Paceart    Recent Labs: 09/19/2022: ALT 12; BUN 30; Creatinine, Ser 2.43; Hemoglobin 14.8; Platelets 196; Potassium 3.5; Sodium 136    Lipid Panel     Component Value Date/Time   CHOL 196 05/08/2020 1332   TRIG 135 05/08/2020 1332   HDL 33 (L) 05/08/2020 1332   CHOLHDL 5.9 (H) 05/08/2020 1332   CHOLHDL 3.9 10/12/2015 1026   VLDL 27 10/12/2015 1026   LDLCALC 138 (H) 05/08/2020 1332   LDLDIRECT 102 (H) 09/15/2020 1635     Wt Readings from Last 3 Encounters:  09/30/22 264 lb (119.7 kg)  09/19/22 275 lb (124.7 kg)  06/02/22 273 lb (123.8 kg)      Other studies Reviewed: Additional studies/ records that were reviewed today include: TEE 10/01/20  Review of the above records today demonstrates:   1. Left ventricular ejection fraction, by estimation, is 30 to 35%. Left  ventricular ejection fraction by 3D volume is 32 %. The left ventricle has  moderately decreased function. The left ventricle demonstrates global  hypokinesis. The left ventricular  internal cavity size was mildly to moderately dilated.   2. Right ventricular systolic function is mildly reduced. The right  ventricular size is normal. In the gastric views, there appears to be a  small echodensity on the device lead (2 cm at largest). In evalation of  the valve in the bicaval view there does  not appears to be evidence of abnormal echodensity (see long series 68).  Clinical Correlation advised.   3. Left atrial size was severely dilated. No  left atrial/left atrial  appendage thrombus was detected.   4. Right atrial size was mildly dilated.   5. The mitral valve is normal in structure. Mild to moderate mitral  valve  regurgitation, central in nature with VCW 0.5 and with pulmonary vein  systolic blunting.   6. The aortic valve is tricuspid. Aortic valve regurgitation is not  visualized.    ASSESSMENT AND PLAN:  1.  Persistent atrial fibrillation: CHA2DS2-VASc of 5.  Currently on amiodarone and Xarelto.  He previously had plans for ablation, though he had to cancel due to family illness.  He is having quite a bit of hip pain currently.  Callahan Wild see him back in 6 months and likely plan for ablation at that time.  2.  Second hypercoagulable state: Currently on Xarelto for atrial fibrillation  3.  High risk medication monitoring: Currently on amiodarone.  Labs without abnormality.  4.  Obstructive sleep apnea: CPAP compliance encouraged  5.  Chronic systolic heart failure: Status post Medtronic ICD.  Device function appropriate.  On heart failure therapy per primary cardiology  Current medicines are reviewed at length with the patient today.   The patient does not have concerns regarding his medicines.  The following changes were made today: None  Labs/ tests ordered today include:  Orders Placed This Encounter  Procedures   TSH   CUP PACEART INCLINIC DEVICE CHECK   EKG 12-Lead     Disposition:   FU 6 months  Signed, Kyiesha Millward Jorja Loa, MD  09/30/2022 4:44 PM     [facility] [address] [place] [address] [phone] (office) [phone] (fax)'

## 2022-10-01 DIAGNOSIS — J9601 Acute respiratory failure with hypoxia: Secondary | ICD-10-CM | POA: Diagnosis not present

## 2022-10-01 DIAGNOSIS — J690 Pneumonitis due to inhalation of food and vomit: Secondary | ICD-10-CM | POA: Diagnosis not present

## 2022-10-01 DIAGNOSIS — I5020 Unspecified systolic (congestive) heart failure: Secondary | ICD-10-CM | POA: Diagnosis not present

## 2022-10-01 LAB — TSH: TSH: 1.04 u[IU]/mL (ref 0.450–4.500)

## 2022-10-03 NOTE — Progress Notes (Signed)
Remote ICD transmission.   

## 2022-10-04 ENCOUNTER — Telehealth: Payer: Self-pay

## 2022-10-04 DIAGNOSIS — K7460 Unspecified cirrhosis of liver: Secondary | ICD-10-CM

## 2022-10-04 DIAGNOSIS — B181 Chronic viral hepatitis B without delta-agent: Secondary | ICD-10-CM

## 2022-10-04 NOTE — Telephone Encounter (Signed)
-----   Message from Cooper Render, CMA sent at 05/03/2022 11:24 AM EST ----- Regarding: RUQ U/S due in late June Hcc screening liver u/s due in late June (last 04-27-22)

## 2022-10-04 NOTE — Telephone Encounter (Signed)
Called and scheduled patient for RUQ U/S at [facility] on Friday, June 28 at 10:00am, arr 9:45am, NPO 6 hours. Letter mailed to patient with appointment information

## 2022-10-17 ENCOUNTER — Ambulatory Visit: Payer: Medicaid Other | Attending: Cardiology

## 2022-10-17 DIAGNOSIS — I5022 Chronic systolic (congestive) heart failure: Secondary | ICD-10-CM

## 2022-10-17 DIAGNOSIS — Z95810 Presence of automatic (implantable) cardiac defibrillator: Secondary | ICD-10-CM | POA: Diagnosis not present

## 2022-10-21 NOTE — Progress Notes (Signed)
EPIC Encounter for ICM Monitoring  Patient Name: Cody Ortega is a 62 y.o. male Date: 10/21/2022 Primary Care Physican: Marcine Matar, MD Primary Cardiologist: Excell Seltzer Electrophysiologist: Ladona Ridgel 06/02/2022 Office Weight: 273 lbs  09/30/2022 Office Weight:    Since 30-Sep-2022 Time in AF  0.3 hr/day (1.1%)                                                            Transmission reviewed.    Optivol thoracic impedance suggesting normal fluid levels with the exception of possible fluid accumulation from 5/28-6/11.   Prescribed:  Furosemide 40 mg take 0.5 tablet (20 mg total) by mouth daily.   Labs: 09/19/2022 Creatinine 2.43, BUN 30, Potassium 3.5, Sodium 136 08/10/2022 Creatinine 2.49, BUN 26, Potassium 3.6, Sodium 138 06/02/2022 Creatinine 2.52, BUN 26, Potassium 4.0, Sodium 133, GFR 29 A complete set of results can be found in Results Review.   Recommendations:  No changes   Follow-up plan: ICM clinic phone appointment on 11/21/2022.   91 day device clinic remote transmission [id number].     EP/Cardiology Office Visits:  Recall 10/09/2022 with Dr Excell Seltzer.  Last visit 09/30/2022 with Dr Elberta Fortis and will need 6 month f/u (no recall).   Copy of ICM check sent to Dr. Ladona Ridgel.  3 month ICM trend: 10/17/2022.    12-14 Month ICM trend:     Karie Soda, RN 10/21/2022 12:10 PM

## 2022-10-22 ENCOUNTER — Encounter: Payer: Self-pay | Admitting: Family

## 2022-10-22 ENCOUNTER — Telehealth: Payer: Self-pay | Admitting: Family

## 2022-10-22 ENCOUNTER — Ambulatory Visit: Payer: Medicaid Other | Attending: Family | Admitting: Family

## 2022-10-22 VITALS — BP 166/103 | HR 64 | Temp 97.6°F | Ht 75.0 in | Wt 266.0 lb

## 2022-10-22 DIAGNOSIS — Z9181 History of falling: Secondary | ICD-10-CM | POA: Diagnosis not present

## 2022-10-22 DIAGNOSIS — F32A Depression, unspecified: Secondary | ICD-10-CM | POA: Diagnosis not present

## 2022-10-22 DIAGNOSIS — M25551 Pain in right hip: Secondary | ICD-10-CM | POA: Diagnosis not present

## 2022-10-22 DIAGNOSIS — I10 Essential (primary) hypertension: Secondary | ICD-10-CM | POA: Diagnosis not present

## 2022-10-22 NOTE — Telephone Encounter (Signed)
Called pt and left vm to remind/confirm of HFU appt scheduled.

## 2022-10-22 NOTE — Progress Notes (Signed)
Cody Ortega, is a 62 y.o. male  YQM:[id number]  BMW:[id number]  DOB - 12/27/1960  Subjective:  Chief Complaint and HPI: Cody Ortega is a 62 y.o. male here today to follow-up for fall and pain to right hip.  Patient has past medical history of CHF, hypertension, asthma, CKD, anemia, cardiomyopathy, CVA, AICD in place, hepatitis B, cirrhosis and hyperlipidemia. Patient was seen at the local emergency room on Sep 19, 2022 after a week and a half pain to his right hip after a mechanical fall.  According to the records and the patient, patient tripped on his new carpet landing to his right side. Imaging studies during ER visit showed no acute findings.  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  It also showed lumbar degeneration at L3-L4 and L5-S1.  CT of head showed chronic ischemic disease to the right side.  DG hip showed mild bilateral degenerative hip arthritis, L>R.  Patient has scheduled ultrasound of abdomen on October 28, 2022.  Patient has been using Tylenol as needed, and is using his father's walker which is more convenient than the one he was discharged with.  No recent fall, patient denies chest pain, shortness of breath, incontinence of urine and feces or any other symptom than minimal pain to his right hip with ambulation. Patient reports losing his father about 4 days ago, reports feeling depressed without suicidal ideation, attempts or plans.  He is under the care of of a local psychiatrist, Ellis Savage, NP of the day [facility], with upcoming appointment with a provider in the next few weeks, per patient.  ED/Hospital notes reviewed.   Social History: Reviewed Family history: Viewed  ROS:   Constitutional:  No f/c, No night sweats, No unexplained weight loss. EENT:  No vision changes, No blurry vision, No hearing changes. No mouth, throat, or ear problems.  Respiratory: No cough, No SOB Cardiac: No CP, no palpitations GI:  No abd pain, No  N/V/D. GU: No Urinary s/sx Musculoskeletal: No joint pain Neuro: No headache, no dizziness, no motor weakness.  Skin: No rash Endocrine:  No polydipsia. No polyuria.  Psych: Denies SI/HI  No problems updated.  ALLERGIES: Allergies  Allergen Reactions   Prochlorperazine Other (See Comments)    Medication caused a lot of issues like blurred vision, nausea, pain, and carpool turnel   Codeine Itching, Nausea And Vomiting and Hives   Hydrocodone-Acetaminophen Itching and Nausea And Vomiting    PAST MEDICAL HISTORY: Past Medical History:  Diagnosis Date   Anxiety    Arthritis    Benzodiazepine dependence (HCC)    Bronchial asthma    CAD (coronary artery disease)    a. Cath 03/2010: mod RCA stenosis, severe diag stenosis, treated medically given lack of angina.   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 Episode in 2008 with spontaneous conversion, on amiodarone per EP.   S/P implantation of automatic cardioverter/defibrillator (  AICD)    a. Medtronic, implanted 2012.   Sleep apnea    Stroke [facility]) 2004   Thyroid disease     MEDICATIONS AT HOME: Prior to Admission medications   Medication Sig Start Date End Date Taking? Authorizing Provider  acetaminophen (TYLENOL) 500 MG tablet Take 1,000 mg by mouth every 6 (six) hours as needed for moderate pain or headache.    [provider]  albuterol (VENTOLIN HFA) 108 (90 Base) MCG/ACT inhaler Inhale 2 puffs into the lungs every 6 (six) hours as needed for wheezing or  shortness of breath. 08/12/21   Marcine Matar, MD  alprazolam Prudy Feeler) 2 MG tablet Take 2 mg by mouth daily as needed for anxiety.    [provider]  amiodarone (PACERONE) 200 MG tablet Take 1 tablet (200 mg total) by mouth daily. 12/09/21   Camnitz, Andree Coss, MD  Artificial Tear Solution (TEARS NATURALE OP) Place 1 drop into both eyes daily as needed (dry eyes).    [provider]  aspirin EC 81 MG tablet Take 81 mg by mouth daily. Swallow whole.    [provider]  atorvastatin (LIPITOR) 10 MG tablet TAKE 1 TABLET(10 MG) BY MOUTH DAILY 08/19/22   Tonny Bollman, MD  carvedilol (COREG) 12.5 MG tablet Take 1.5 tabs PO twice a day PO 08/12/21   Marcine Matar, MD  dapagliflozin propanediol (FARXIGA) 10 MG TABS tablet Take 1 tablet (10 mg total) by mouth daily before breakfast. 04/14/22   Tonny Bollman, MD  DULoxetine (CYMBALTA) 30 MG capsule Take 90 mg by mouth daily with breakfast.    [provider]  entecavir (BARACLUDE) 0.5 MG tablet Take 1 tablet (0.5 mg total) by mouth every other day. 08/16/22   Armbruster, Willaim Rayas, MD  ENTRESTO 49-51 MG TAKE 1 TABLET BY MOUTH TWICE DAILY 02/15/22   Tonny Bollman, MD  ezetimibe (ZETIA) 10 MG tablet TAKE 1 TABLET(10 MG) BY MOUTH DAILY 08/29/22   Tonny Bollman, MD  fluticasone [facility]) 50 MCG/ACT nasal spray SHAKE LIQUID AND USE 1 SPRAY IN [facility] NOSTRIL DAILY 08/12/21   Marcine Matar, MD  furosemide (LASIX) 40 MG tablet TAKE 1/2 TABLET(20 MG) BY MOUTH DAILY 12/29/21   Tonny Bollman, MD  hydrALAZINE (APRESOLINE) 100 MG tablet TAKE 1 TABLET(100 MG) BY MOUTH IN THE MORNING AND AT BEDTIME 04/26/22   Nahser, Deloris Ping, MD  ondansetron (ZOFRAN-ODT) 8 MG disintegrating tablet Take 8 mg by mouth daily. 12/11/19   [provider]  oxybutynin (DITROPAN) 5 MG tablet TAKE 1 TABLET(5 MG) BY MOUTH THREE TIMES DAILY 04/08/22   Marcine Matar, MD  polyethylene glycol (MIRALAX) 17 g packet Take 17 g by mouth 2  (two) times daily. Take as directed, Daily, titrate as needed 04/08/22   Armbruster, Willaim Rayas, MD  Rectal Protectant-Emollient (CALMOL-4) 76-10 % SUPP Use as directed once to twice daily 04/08/22   Armbruster, Willaim Rayas, MD  RESTASIS 0.05 % ophthalmic emulsion Place 1 drop into both eyes in the morning. 11/25/21   [provider]  sildenafil (REVATIO) 20 MG tablet Take 1 tablet (20 mg total) by mouth as needed. 12/03/21   Tonny Bollman, MD  SYMBICORT 160-4.5 MCG/ACT inhaler INHALE 2 PUFFS INTO THE LUNGS TWICE DAILY 11/10/21   Marcine Matar, MD  tamsulosin (FLOMAX) 0.4 MG CAPS capsule TAKE 1 CAPSULE(0.4 MG) BY MOUTH DAILY 11/02/21   Marcine Matar, MD  XARELTO 20 MG TABS tablet TAKE 1 TABLET(20 MG) BY MOUTH DAILY WITH SUPPER  09/28/22   Camnitz, Andree Coss, MD  zaleplon (SONATA) 10 MG capsule Take 10 mg by mouth at bedtime. 09/20/22   [provider]     Objective:  EXAM:   Vitals:   10/22/22 1041  BP: (!) 166/103  Pulse: 64  Temp: 97.6 F (36.4 C)  SpO2: 97%   General appearance : A&OX3. NAD. Non-toxic-appearing HEENT: Atraumatic and Normocephalic.  PERRLA. EOM intact.  TM clear B. Mouth-MMM, post pharynx WNL w/o erythema, No PND. Neck: supple, no JVD. No cervical lymphadenopathy. No thyromegaly Chest/Lungs:  Breathing-non-labored, Good air entry bilaterally, breath sounds normal without rales, rhonchi, or wheezing  CVS: S1 S2 regular, no murmurs, gallops, rubs  Abdomen: Bowel sounds present, Non tender and not distended with no gaurding, rigidity or rebound. Extremities: Bilateral Lower Ext shows no edema, both legs are warm to touch with = pulse throughout Neurology:  CN II-XII grossly intact, Non focal.   Psych:  TP linear. J/I WNL. Normal speech. Appropriate eye contact and affect.  Skin:  No Rash  Data Review Lab Results  Component Value Date   HGBA1C 5.6 12/09/2020   HGBA1C 5.0 07/09/2018   HGBA1C 5 5 07/09/2015     Assessment & Plan  Cody Ortega is  a 62 year old male who is seen this morning for hospital follow-up.  He recently suffered mechanical fall after tripping to his new carpet and landing to his right hip and right rib cage.  He was evaluated at local emergency room services and discharged home there after.  Since discharge his symptoms have improved and has upcoming appointment for abdominal ultrasound test.  He has been using Tylenol as needed and his incontinence has stopped.  Patient just lost his father about 4 days ago and reports being depressed, has upcoming appointment with his psychiatrist in the next few weeks.   Plan: 1. History of recent fall  -Patient reminded to remove all the potential hazards that could cause falls in his house  -Reminded to use a walker as indicated  -Patient referred to physical therapy.  2. Pain of right hip   -Patient to take Tylenol as needed for pain  -Referred to physical therapy  -Reminded to report new or worsening symptoms to the clinic or local ED.  3. Hypertension, essential:  -Patient who did not take his morning blood pressure medication encouraged to take his blood pressure medication as ordered, twice a day.  -Follow-up with primary care doctor in 3 months.  -Report new or worsening symptoms to the clinic or local emergency room.  Verbalized understanding.  4. Depression, unspecified:  -Call Lance Coon' office tomorrow morning to confirm the upcoming visit  -Take all the medications as prescribed including Cymbalta.  -Report new or worsening symptoms to the clinic, local emergency room, or by calling 911.      Patient have been counseled extensively about nutrition and exercise  Return in about 3 months (around 01/22/2023).  The patient was given clear instructions to go to ER or return to medical center if symptoms don't improve, worsen or new problems develop. The patient verbalized understanding. The patient was told to call to get lab results if they haven't heard anything  in the next week.     Eleonore Chiquito, DNP, APRN, FNP-C [facility] [place], [place] [phone]   10/22/2022, 12:37 PM

## 2022-10-22 NOTE — Patient Instructions (Addendum)
1) Take your blood pressure as ordered, everyday 2) I referred you to physical therapy, they will contact you for the appointment 3) For new or worsening symptoms, please, go to the ER or call 911. 4) Please call your Psychiatrist and keep the upcoming appointment with them 5) Follow up with your primary care provider in 3 months.  6) Take Tylenol as needed for pain

## 2022-10-24 ENCOUNTER — Other Ambulatory Visit: Payer: Self-pay | Admitting: Internal Medicine

## 2022-10-24 ENCOUNTER — Ambulatory Visit (INDEPENDENT_AMBULATORY_CARE_PROVIDER_SITE_OTHER): Payer: Medicaid Other | Admitting: Gastroenterology

## 2022-10-24 ENCOUNTER — Encounter: Payer: Self-pay | Admitting: Gastroenterology

## 2022-10-24 ENCOUNTER — Telehealth: Payer: Self-pay | Admitting: Gastroenterology

## 2022-10-24 VITALS — BP 130/88 | HR 60 | Ht 75.0 in | Wt 260.0 lb

## 2022-10-24 DIAGNOSIS — B181 Chronic viral hepatitis B without delta-agent: Secondary | ICD-10-CM | POA: Diagnosis not present

## 2022-10-24 DIAGNOSIS — K7460 Unspecified cirrhosis of liver: Secondary | ICD-10-CM | POA: Diagnosis not present

## 2022-10-24 DIAGNOSIS — Z86010 Personal history of colonic polyps: Secondary | ICD-10-CM

## 2022-10-24 DIAGNOSIS — Z7901 Long term (current) use of anticoagulants: Secondary | ICD-10-CM

## 2022-10-24 DIAGNOSIS — K649 Unspecified hemorrhoids: Secondary | ICD-10-CM

## 2022-10-24 DIAGNOSIS — K5909 Other constipation: Secondary | ICD-10-CM

## 2022-10-24 DIAGNOSIS — N400 Enlarged prostate without lower urinary tract symptoms: Secondary | ICD-10-CM

## 2022-10-24 NOTE — Telephone Encounter (Signed)
I reached out to Dr. Elberta Fortis about possible echo for this patient.  I did not realize the patient had an echo last year, shown in care everywhere, which shows stable EF of 30 to 35%.  This is been stable over time at this point in unlikely to be much different if we check it now.  Thank you let the patient know that I do not think we need to do another echo.  If he wants to do the colonoscopy needs to be done at the hospital, this would likely first be able to be done in September or October.  He would need to ask for approval to hold his Xarelto for 2 days prior to the exam.  If he wants to do it we can schedule it.  If he wants to wait till his A-fib is ablated we can wait for after that to be done, however that would likely delay this further by several months.  If he is otherwise stable I think we can proceed this fall if he wishes.  Can you let me know?  Thanks

## 2022-10-24 NOTE — Progress Notes (Signed)
HPI :  62 year old male here for follow-up visit for hep B associated cirrhosis. Recall he has a complicated history with CHF (EF 30-35%), ICD in place, A. fib on Xarelto, history of stroke.      Hep B history: Dx hepatitis B in August 2019, high viral load with positive E antigen.  Liver imaging at the time was consistent with cirrhosis.  He was started on entecavir 0.5 mg every other day due to his CKD and has been on it since.  Over time his viral load has decreased and has since become undetectable. With therapy he seroconverted in his hepatitis B E antigen is now negative. He is not drinking any alcohol.   He has not had any decompensating events since have seen him.  Denies any jaundice.  No problems with ascites.  He continues to take Coreg for his heart so we have not pursued variceal screening.  His labs are up-to-date as outlined below.  He is scheduled for right upper quadrant ultrasound next week.  He was supposed to see cardiology and have a cardiac ablation done for his A-fib after our last visit.  He had a family emergency with illness of his father ultimately passed away and this was delayed.  He is considering doing the ablation later next year.  He is not having any symptoms from A-fib that bother him.  He states he is doing really well.  He states he has tapered off the amiodarone and is not taking that anymore.  He denies any cardiopulmonary symptoms.  He is overdue for his colonoscopy, as outlined below, was due in October 2022.  We had been trying to coordinate this around his ablation, he is unclear if and when that will happen.  He states his bowels have been moving really well.  Takes MiraLAX every day.  He states his hemorrhoids have been much better on the regimen and the Calmol 4 suppositories worked "like Pharmacist, community".  He is very happy with the Calmol 4 as for some samples of that for flares.    Prior workup: Colonoscopy 02/06/2018 - 7 small polyps, diverticulosis,  hemorrhoids - adenomas - repeat in 3 years    RUQ [place] 04/27/22: IMPRESSION: 1. Morphologic changes compatible with cirrhosis. No focal lesion. 2. No cholelithiasis or sonographic evidence for acute cholecystitis.   Past Medical History:  Diagnosis Date   Anxiety    Arthritis    Benzodiazepine dependence (HCC)    Bronchial asthma    CAD (coronary artery disease)    a. Cath 03/2010: mod RCA stenosis, severe diag stenosis, treated medically given lack of angina.   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 Episode in 2008 with spontaneous conversion, on amiodarone per EP.   S/P implantation of automatic cardioverter/defibrillator (AICD)    a. Medtronic, implanted 2012.   Sleep apnea    Stroke [facility]) 2004   Thyroid disease  Past Surgical History:  Procedure Laterality Date   CARDIOVERSION N/A 01/07/2014   Procedure: CARDIOVERSION;  Surgeon: Lewayne Bunting, MD;  Location: [facility] ENDOSCOPY;  Service: Cardiovascular;  Laterality: N/A;   CARDIOVERSION N/A 02/11/2014   Procedure: CARDIOVERSION;  Surgeon: Chrystie Nose, MD;  Location: [facility] ENDOSCOPY;  Service: Cardiovascular;  Laterality: N/A;   CARDIOVERSION N/A 10/01/2020   Procedure: CARDIOVERSION;  Surgeon: Christell Constant, MD;  Location: [facility] ENDOSCOPY;  Service: Cardiovascular;  Laterality: N/A;   CATARACT EXTRACTION     COLONOSCOPY WITH PROPOFOL N/A 02/06/2018   Procedure: COLONOSCOPY WITH PROPOFOL;  Surgeon: Benancio Deeds, MD;  Location: [facility] ENDOSCOPY;   Service: Gastroenterology;  Laterality: N/A;   EYE SURGERY     ICD GENERATOR CHANGEOUT N/A 01/17/2020   Procedure: ICD GENERATOR CHANGEOUT;  Surgeon: Marinus Maw, MD;  Location: [facility] INVASIVE CV LAB;  Service: Cardiovascular;  Laterality: N/A;   LASEK eye surgery Bilateral 05/2018   LASIK Bilateral    PACEMAKER INSERTION     POLYPECTOMY  02/06/2018   Procedure: POLYPECTOMY;  Surgeon: Benancio Deeds, MD;  Location: [facility] ENDOSCOPY;  Service: Gastroenterology;;   TEE WITHOUT CARDIOVERSION N/A 01/07/2014   Procedure: TRANSESOPHAGEAL ECHOCARDIOGRAM (TEE);  Surgeon: Lewayne Bunting, MD;  Location: [facility] ENDOSCOPY;  Service: Cardiovascular;  Laterality: N/A;   TEE WITHOUT CARDIOVERSION N/A 02/11/2014   Procedure: TRANSESOPHAGEAL ECHOCARDIOGRAM (TEE);  Surgeon: Chrystie Nose, MD;  Location: [facility] ENDOSCOPY;  Service: Cardiovascular;  Laterality: N/A;   TEE WITHOUT CARDIOVERSION N/A 10/01/2020   Procedure: TRANSESOPHAGEAL ECHOCARDIOGRAM (TEE);  Surgeon: Christell Constant, MD;  Location: [facility] ENDOSCOPY;  Service: Cardiovascular;  Laterality: N/A;   TONSILLECTOMY     TRANSTHORACIC ECHOCARDIOGRAM  10/2006, 12/2009   Family History  Problem Relation Age of Onset   Breast cancer Mother    Multiple myeloma Mother    Prostate cancer Father    Diabetes Father    Peripheral vascular disease Father    Cerebral palsy Daughter    Lung disease Neg Hx    Colon cancer Neg Hx    Stomach cancer Neg Hx    Esophageal cancer Neg Hx    Colon polyps Neg Hx    Social History   Tobacco Use   Smoking status: Former    Packs/day: 0.50    Years: 0.50    Additional pack years: 0.00    Total pack years: 0.25    Types: Cigarettes    Quit date: 05/02/2010    Years since quitting: 12.4   Smokeless tobacco: Never   Tobacco comments:    Former smoker Significant Second-hand and only 3 months himself 01/05/22  Vaping Use   Vaping Use: Never used  Substance Use Topics   Alcohol use: Not Currently     Alcohol/week: 0.0 standard drinks of alcohol    Comment: rare   Drug use: No    Comment: Reported history of cocaine abuse off and on    Current Outpatient Medications  Medication Sig Dispense Refill   acetaminophen (TYLENOL) 500 MG tablet Take 1,000 mg by mouth every 6 (six) hours as needed for moderate pain or headache.     albuterol (VENTOLIN HFA) 108 (90 Base) MCG/ACT inhaler Inhale 2 puffs into the lungs every 6 (six) hours as needed for wheezing or shortness of breath. 8.5 g 6   alprazolam (XANAX) 2 MG tablet Take 2 mg by mouth daily as needed for anxiety.     amiodarone (PACERONE) 200 MG tablet Take 1 tablet (200  mg total) by mouth daily. 90 tablet 2   Artificial Tear Solution (TEARS NATURALE OP) Place 1 drop into both eyes daily as needed (dry eyes).     aspirin EC 81 MG tablet Take 81 mg by mouth daily. Swallow whole.     atorvastatin (LIPITOR) 10 MG tablet TAKE 1 TABLET(10 MG) BY MOUTH DAILY 90 tablet 0   carvedilol (COREG) 12.5 MG tablet Take 1.5 tabs PO twice a day PO 270 tablet 3   dapagliflozin propanediol (FARXIGA) 10 MG TABS tablet Take 1 tablet (10 mg total) by mouth daily before breakfast. 90 tablet 3   DULoxetine (CYMBALTA) 30 MG capsule Take 90 mg by mouth daily with breakfast.     entecavir (BARACLUDE) 0.5 MG tablet Take 1 tablet (0.5 mg total) by mouth every other day. 45 tablet 1   ENTRESTO 49-51 MG TAKE 1 TABLET BY MOUTH TWICE DAILY 180 tablet 1   ezetimibe (ZETIA) 10 MG tablet TAKE 1 TABLET(10 MG) BY MOUTH DAILY 90 tablet 2   fluticasone (FLONASE) 50 MCG/ACT nasal spray SHAKE LIQUID AND USE 1 SPRAY IN EACH NOSTRIL DAILY 16 g 4   furosemide (LASIX) 40 MG tablet TAKE 1/2 TABLET(20 MG) BY MOUTH DAILY 45 tablet 3   hydrALAZINE (APRESOLINE) 100 MG tablet TAKE 1 TABLET(100 MG) BY MOUTH IN THE MORNING AND AT BEDTIME 180 tablet 3   ondansetron (ZOFRAN-ODT) 8 MG disintegrating tablet Take 8 mg by mouth daily.     oxybutynin (DITROPAN) 5 MG tablet TAKE 1 TABLET(5 MG) BY MOUTH  THREE TIMES DAILY 90 tablet 0   polyethylene glycol (MIRALAX) 17 g packet Take 17 g by mouth 2 (two) times daily. Take as directed, Daily, titrate as needed 14 each 0   Rectal Protectant-Emollient (CALMOL-4) 76-10 % SUPP Use as directed once to twice daily 4 suppository 0   RESTASIS 0.05 % ophthalmic emulsion Place 1 drop into both eyes in the morning.     sildenafil (REVATIO) 20 MG tablet Take 1 tablet (20 mg total) by mouth as needed. 90 tablet 0   SYMBICORT 160-4.5 MCG/ACT inhaler INHALE 2 PUFFS INTO THE LUNGS TWICE DAILY 10.2 g 2   tamsulosin (FLOMAX) 0.4 MG CAPS capsule TAKE 1 CAPSULE(0.4 MG) BY MOUTH DAILY 90 capsule 1   XARELTO 20 MG TABS tablet TAKE 1 TABLET(20 MG) BY MOUTH DAILY WITH SUPPER 90 tablet 1   zaleplon (SONATA) 10 MG capsule Take 10 mg by mouth at bedtime.     No current facility-administered medications for this visit.   Allergies  Allergen Reactions   Prochlorperazine Other (See Comments)    Medication caused a lot of issues like blurred vision, nausea, pain, and carpool turnel   Codeine Itching, Nausea And Vomiting and Hives   Hydrocodone-Acetaminophen Itching and Nausea And Vomiting     Review of Systems: All systems reviewed and negative except where noted in HPI.   Lab Results  Component Value Date   WBC 8.4 09/19/2022   HGB 14.8 09/19/2022   HCT 44.5 09/19/2022   MCV 94.3 09/19/2022   PLT 196 09/19/2022    Lab Results  Component Value Date   CREATININE 2.43 (H) 09/19/2022   BUN 30 (H) 09/19/2022   NA 136 09/19/2022   K 3.5 09/19/2022   CL 105 09/19/2022   CO2 22 09/19/2022    Lab Results  Component Value Date   ALT 12 09/19/2022   AST 10 (L) 09/19/2022   ALKPHOS 52 09/19/2022   BILITOT 0.5 09/19/2022  Lab Results  Component Value Date   INR 1.1 (H) 02/04/2022   INR 1.4 (H) 07/20/2021   INR 1.2 (H) 06/19/2020    MELD 3.0: 17 at 02/04/2022  2:03 PM MELD-Na: 16 at 02/04/2022  2:03 PM Calculated from: Serum Creatinine: 2.47 mg/dL at  03/10/1477  2:95 PM Serum Sodium: 137 mEq/L at 02/04/2022  2:03 PM Total Bilirubin: 0.5 mg/dL (Using min of 1 mg/dL) at 62/05/3084  5:78 PM Serum Albumin: 4.0 g/dL (Using max of 3.5 g/dL) at 46/12/6293  2:84 PM INR(ratio): 1.1 ratio at 02/04/2022  2:03 PM Age at listing (hypothetical): 61 years Sex: Male at 02/04/2022  2:03 PM    Physical Exam: BP (!) 136/90   Pulse 60   Ht 6\' 3"  (1.905 m)   Wt 260 lb (117.9 kg)   SpO2 97%   BMI 32.50 kg/m  Constitutional: Pleasant,well-developed, male in no acute distress. Neurological: Alert and oriented to person place and time. Psychiatric: Normal mood and affect. Behavior is normal.   ASSESSMENT: 61 y.o. male here for assessment of the following  1. Cirrhosis of liver without ascites, unspecified hepatic cirrhosis type (HCC)   2. Chronic hepatitis B (HCC)   3. Chronic constipation   4. Hemorrhoids, unspecified hemorrhoid type   5. History of colon polyps   6. Anticoagulated    Cirrhosis compensated and stable.  MELD 17 due to his kidney disease.  He is not taking any alcohol, his hep B viral load is undetectable.  He will continue Entecavir.  Discussed risks for decompensation and HCC over time.  Due for [facility] screening with ultrasound, scheduled for that this week.  He is due for repeat labs in October.  Constipation better controlled on MiraLAX.  He should continue that once to twice daily as needed.  Gave him additional samples of Calmol 4 suppositories to use as needed.  He is overdue for surveillance colonoscopy.  We had delayed this at the last visit in preparation for A-fib ablation procedure, however due to death of his father this was postponed.  He is unclear if and when he will have this.  He has stopped the amiodarone.  He has no symptoms of A-fib that bother him.  I will reach out to his cardiologist to see if they are considering a repeat echo at some point in the near future.  If so would be good to have that back prior to considering  colonoscopy, to recheck his EF has not been evaluated in a few years.  If his EF is greater than 35% have a colonoscopy at the [facility], otherwise if it remains low he will need to be done at the hospital for anesthesia support.  Currently booking into September/October for hospital cases.  Will touch base with cardiology to see if he is having an echo soon and when they are considering ablation therapy and can try to coordinate his colonoscopy around that in the upcoming months.  PLAN: - scheduled for RUQ [place] this week - recall labs - CBC, CMET, hepatitis B DNA, and AFP in October - continue Entecavir - continue Miralax, increase to twice daily - gave samples of Calmol4 suppositories - question is timing of his colonoscopy - will touch base with cardiology to see if they would consider doing an echo. Will need Xarelto held if we do it, need to sort out timing in regards to his upcoming ablation  Harlin Rain, MD [facility]

## 2022-10-24 NOTE — Patient Instructions (Addendum)
If your blood pressure at your visit was 140/90 or greater, please contact your primary care physician to follow up on this. ______________________________________________________  If you are age 60 or older, your body mass index should be between 23-30. Your Body mass index is 32.5 kg/m. If this is out of the aforementioned range listed, please consider follow up with your Primary Care Provider.  If you are age 75 or younger, your body mass index should be between 19-25. Your Body mass index is 32.5 kg/m. If this is out of the aformentioned range listed, please consider follow up with your Primary Care Provider.  ________________________________________________________  The [facility] providers would like to encourage you to use [facility] to communicate with providers for non-urgent requests or questions.  Due to long hold times on the telephone, sending your provider a message by [facility] may be a faster and more efficient way to get a response.  Please allow 48 business hours for a response.  Please remember that this is for non-urgent requests.  _______________________________________________________  Due to recent changes in healthcare laws, you may see the results of your imaging and laboratory studies on MyChart before your provider has had a chance to review them.  We understand that in some cases there may be results that are confusing or concerning to you. Not all laboratory results come back in the same time frame and the provider may be waiting for multiple results in order to interpret others.  Please give [place] 48 hours in order for your provider to thoroughly review all the results before contacting the office for clarification of your results.   We have given you samples of the following medication to take: Calmol 4 suppositories: Use as needed  Continue Miralax: Use twice a day as needed  You will be due for labs in October.  Follow up in 6 months.  Thank you for entrusting me with  your care and for choosing [facility], Dr. Ileene Patrick

## 2022-10-25 ENCOUNTER — Other Ambulatory Visit: Payer: Self-pay

## 2022-10-25 DIAGNOSIS — Z7901 Long term (current) use of anticoagulants: Secondary | ICD-10-CM

## 2022-10-25 DIAGNOSIS — Z86010 Personal history of colonic polyps: Secondary | ICD-10-CM

## 2022-10-25 NOTE — Progress Notes (Signed)
Colonoscopy at [facility] on 02-06-23

## 2022-10-25 NOTE — Telephone Encounter (Signed)
Requested Prescriptions  Pending Prescriptions Disp Refills   tamsulosin (FLOMAX) 0.4 MG CAPS capsule [Pharmacy Med Name: TAMSULOSIN 0.4MG  CAPSULES] 90 capsule 0    Sig: TAKE 1 CAPSULE(0.4 MG) BY MOUTH DAILY     Urology: Alpha-Adrenergic Blocker Failed - 10/24/2022  6:01 PM      Failed - PSA in normal range and within 360 days    Prostate Specific Ag, Serum  Date Value Ref Range Status  01/02/2019 3.3 0.0 - 4.0 ng/mL Final    Comment:    Roche ECLIA methodology. According to the American Urological Association, Serum PSA should decrease and remain at undetectable levels after radical prostatectomy. The AUA defines biochemical recurrence as an initial PSA value 0.2 ng/mL or greater followed by a subsequent confirmatory PSA value 0.2 ng/mL or greater. Values obtained with different assay methods or kits cannot be used interchangeably. Results cannot be interpreted as absolute evidence of the presence or absence of malignant disease.          Passed - Last BP in normal range    BP Readings from Last 1 Encounters:  10/24/22 130/88         Passed - Valid encounter within last 12 months    Recent Outpatient Visits           3 days ago History of recent fall   [place] [facility] Eleonore Chiquito, FNP   1 year ago COPD with asthma [facility])   [place] [facility] Marcine Matar, MD   1 year ago OSA on CPAP   [place] [facility] Marcine Matar, MD   1 year ago Essential hypertension   [place] [facility] Marcine Matar, MD   1 year ago OSA (obstructive sleep apnea)   [place] [facility] Marcine Matar, MD       Future Appointments             In 3 months Laural Benes Binnie Rail, MD [facility]

## 2022-10-25 NOTE — Telephone Encounter (Signed)
Called and left detailed message for patient with Dr. Lanetta Inch recommendation.  He has Oct 7th available at [facility] for his colonoscopy. He will need a PV 2 weeks prior and authorization to hold Xarelto for 2 days from [facility].  Scheduled patient for 8:15am on 10-7 at [facility] Case #[id number] Patient scheduled for a PV on 9-23 at 10:00am Reminder placed to ask for BT clearance once we get closer to October.

## 2022-10-27 NOTE — Telephone Encounter (Signed)
Called and spoke to patient.  He understands when he has his colonoscopy and PV scheduled.  Letter is being mailed to patient with appointment info.  He will let us know if anything changes with his health in the interim.

## 2022-10-28 ENCOUNTER — Ambulatory Visit (HOSPITAL_COMMUNITY): Admission: RE | Admit: 2022-10-28 | Payer: Medicaid Other | Source: Ambulatory Visit

## 2022-10-31 DIAGNOSIS — I5020 Unspecified systolic (congestive) heart failure: Secondary | ICD-10-CM | POA: Diagnosis not present

## 2022-10-31 DIAGNOSIS — J9601 Acute respiratory failure with hypoxia: Secondary | ICD-10-CM | POA: Diagnosis not present

## 2022-10-31 DIAGNOSIS — J690 Pneumonitis due to inhalation of food and vomit: Secondary | ICD-10-CM | POA: Diagnosis not present

## 2022-11-10 ENCOUNTER — Ambulatory Visit: Payer: Medicaid Other | Attending: Family

## 2022-11-20 ENCOUNTER — Other Ambulatory Visit: Payer: Self-pay | Admitting: Cardiovascular Disease

## 2022-11-21 ENCOUNTER — Ambulatory Visit: Payer: Medicaid Other | Attending: Cardiology

## 2022-11-21 DIAGNOSIS — I5022 Chronic systolic (congestive) heart failure: Secondary | ICD-10-CM | POA: Diagnosis not present

## 2022-11-21 DIAGNOSIS — Z95810 Presence of automatic (implantable) cardiac defibrillator: Secondary | ICD-10-CM | POA: Diagnosis not present

## 2022-11-23 NOTE — Progress Notes (Signed)
EPIC Encounter for ICM Monitoring  Patient Name: Cody Ortega is a 62 y.o. male Date: 11/23/2022 Primary Care Physican: Marcine Matar, MD Primary Cardiologist: Excell Seltzer Electrophysiologist: Ladona Ridgel 06/02/2022 Office Weight: 273 lbs  09/30/2022 Office Weight:    Since 16-Nov-2022 AF   31 Time in AF  1.2 hr/day (4.9%)                                                            Transmission reviewed.    Optivol thoracic impedance suggesting intermittent days with possible fluid accumulation within the last month.   Prescribed:  Furosemide 40 mg take 0.5 tablet (20 mg total) by mouth daily.   Labs: 09/19/2022 Creatinine 2.43, BUN 30, Potassium 3.5, Sodium 136 08/10/2022 Creatinine 2.49, BUN 26, Potassium 3.6, Sodium 138 06/02/2022 Creatinine 2.52, BUN 26, Potassium 4.0, Sodium 133, GFR 29 A complete set of results can be found in Results Review.   Recommendations:  No changes   Follow-up plan: ICM clinic phone appointment on 12/26/2022.   91 day device clinic remote transmission [id number].     EP/Cardiology Office Visits:  Recall 10/09/2022 with Dr Excell Seltzer.  Recall 03/31/2023 with Dr Elberta Fortis (6 month).   Copy of ICM check sent to Dr. Ladona Ridgel.   3 month ICM trend: 11/21/2022.    12-14 Month ICM trend:     Karie Soda, RN 11/23/2022 9:13 AM

## 2022-12-05 ENCOUNTER — Ambulatory Visit: Payer: Medicaid Other

## 2022-12-05 DIAGNOSIS — I5022 Chronic systolic (congestive) heart failure: Secondary | ICD-10-CM

## 2022-12-05 DIAGNOSIS — I4819 Other persistent atrial fibrillation: Secondary | ICD-10-CM

## 2022-12-13 ENCOUNTER — Other Ambulatory Visit: Payer: Self-pay | Admitting: Internal Medicine

## 2022-12-13 DIAGNOSIS — J4489 Other specified chronic obstructive pulmonary disease: Secondary | ICD-10-CM

## 2022-12-13 DIAGNOSIS — I10 Essential (primary) hypertension: Secondary | ICD-10-CM

## 2022-12-13 DIAGNOSIS — J302 Other seasonal allergic rhinitis: Secondary | ICD-10-CM

## 2022-12-16 NOTE — Progress Notes (Signed)
Remote ICD transmission.   

## 2022-12-17 ENCOUNTER — Other Ambulatory Visit: Payer: Self-pay

## 2022-12-17 ENCOUNTER — Emergency Department (HOSPITAL_COMMUNITY): Payer: Medicaid Other

## 2022-12-17 ENCOUNTER — Encounter (HOSPITAL_COMMUNITY): Payer: Self-pay

## 2022-12-17 ENCOUNTER — Inpatient Hospital Stay (HOSPITAL_COMMUNITY)
Admission: EM | Admit: 2022-12-17 | Discharge: 2023-01-01 | DRG: 023 | Disposition: E | Payer: Medicaid Other | Attending: Internal Medicine | Admitting: Internal Medicine

## 2022-12-17 DIAGNOSIS — T80818A Extravasation of other vesicant agent, initial encounter: Secondary | ICD-10-CM | POA: Diagnosis not present

## 2022-12-17 DIAGNOSIS — R531 Weakness: Principal | ICD-10-CM

## 2022-12-17 DIAGNOSIS — I69398 Other sequelae of cerebral infarction: Secondary | ICD-10-CM

## 2022-12-17 DIAGNOSIS — I2510 Atherosclerotic heart disease of native coronary artery without angina pectoris: Secondary | ICD-10-CM | POA: Diagnosis present

## 2022-12-17 DIAGNOSIS — W19XXXA Unspecified fall, initial encounter: Secondary | ICD-10-CM | POA: Diagnosis present

## 2022-12-17 DIAGNOSIS — R29818 Other symptoms and signs involving the nervous system: Secondary | ICD-10-CM | POA: Diagnosis not present

## 2022-12-17 DIAGNOSIS — I10 Essential (primary) hypertension: Secondary | ICD-10-CM | POA: Diagnosis present

## 2022-12-17 DIAGNOSIS — I6523 Occlusion and stenosis of bilateral carotid arteries: Secondary | ICD-10-CM | POA: Diagnosis not present

## 2022-12-17 DIAGNOSIS — I4819 Other persistent atrial fibrillation: Secondary | ICD-10-CM | POA: Diagnosis not present

## 2022-12-17 DIAGNOSIS — Z560 Unemployment, unspecified: Secondary | ICD-10-CM

## 2022-12-17 DIAGNOSIS — J690 Pneumonitis due to inhalation of food and vomit: Secondary | ICD-10-CM | POA: Diagnosis not present

## 2022-12-17 DIAGNOSIS — E1122 Type 2 diabetes mellitus with diabetic chronic kidney disease: Secondary | ICD-10-CM | POA: Diagnosis present

## 2022-12-17 DIAGNOSIS — K219 Gastro-esophageal reflux disease without esophagitis: Secondary | ICD-10-CM | POA: Diagnosis present

## 2022-12-17 DIAGNOSIS — Z803 Family history of malignant neoplasm of breast: Secondary | ICD-10-CM

## 2022-12-17 DIAGNOSIS — R29810 Facial weakness: Secondary | ICD-10-CM | POA: Diagnosis present

## 2022-12-17 DIAGNOSIS — Z95810 Presence of automatic (implantable) cardiac defibrillator: Secondary | ICD-10-CM | POA: Diagnosis not present

## 2022-12-17 DIAGNOSIS — I5022 Chronic systolic (congestive) heart failure: Secondary | ICD-10-CM | POA: Diagnosis not present

## 2022-12-17 DIAGNOSIS — R202 Paresthesia of skin: Secondary | ICD-10-CM | POA: Diagnosis not present

## 2022-12-17 DIAGNOSIS — B1910 Unspecified viral hepatitis B without hepatic coma: Secondary | ICD-10-CM | POA: Diagnosis present

## 2022-12-17 DIAGNOSIS — Z66 Do not resuscitate: Secondary | ICD-10-CM | POA: Diagnosis not present

## 2022-12-17 DIAGNOSIS — E440 Moderate protein-calorie malnutrition: Secondary | ICD-10-CM | POA: Insufficient documentation

## 2022-12-17 DIAGNOSIS — I6521 Occlusion and stenosis of right carotid artery: Secondary | ICD-10-CM

## 2022-12-17 DIAGNOSIS — Z91128 Patient's intentional underdosing of medication regimen for other reason: Secondary | ICD-10-CM

## 2022-12-17 DIAGNOSIS — N179 Acute kidney failure, unspecified: Secondary | ICD-10-CM | POA: Diagnosis not present

## 2022-12-17 DIAGNOSIS — Z91199 Patient's noncompliance with other medical treatment and regimen due to unspecified reason: Secondary | ICD-10-CM

## 2022-12-17 DIAGNOSIS — Z7951 Long term (current) use of inhaled steroids: Secondary | ICD-10-CM

## 2022-12-17 DIAGNOSIS — R4702 Dysphasia: Secondary | ICD-10-CM | POA: Diagnosis not present

## 2022-12-17 DIAGNOSIS — I609 Nontraumatic subarachnoid hemorrhage, unspecified: Secondary | ICD-10-CM | POA: Diagnosis present

## 2022-12-17 DIAGNOSIS — F1290 Cannabis use, unspecified, uncomplicated: Secondary | ICD-10-CM | POA: Diagnosis present

## 2022-12-17 DIAGNOSIS — I429 Cardiomyopathy, unspecified: Secondary | ICD-10-CM | POA: Diagnosis present

## 2022-12-17 DIAGNOSIS — E873 Alkalosis: Secondary | ICD-10-CM | POA: Diagnosis present

## 2022-12-17 DIAGNOSIS — Z8042 Family history of malignant neoplasm of prostate: Secondary | ICD-10-CM

## 2022-12-17 DIAGNOSIS — E669 Obesity, unspecified: Secondary | ICD-10-CM | POA: Insufficient documentation

## 2022-12-17 DIAGNOSIS — N1832 Chronic kidney disease, stage 3b: Secondary | ICD-10-CM | POA: Diagnosis not present

## 2022-12-17 DIAGNOSIS — Z515 Encounter for palliative care: Secondary | ICD-10-CM

## 2022-12-17 DIAGNOSIS — I4891 Unspecified atrial fibrillation: Secondary | ICD-10-CM | POA: Diagnosis present

## 2022-12-17 DIAGNOSIS — R0902 Hypoxemia: Secondary | ICD-10-CM | POA: Diagnosis not present

## 2022-12-17 DIAGNOSIS — Z87891 Personal history of nicotine dependence: Secondary | ICD-10-CM

## 2022-12-17 DIAGNOSIS — R29722 NIHSS score 22: Secondary | ICD-10-CM | POA: Diagnosis not present

## 2022-12-17 DIAGNOSIS — Z8249 Family history of ischemic heart disease and other diseases of the circulatory system: Secondary | ICD-10-CM

## 2022-12-17 DIAGNOSIS — R231 Pallor: Secondary | ICD-10-CM | POA: Diagnosis not present

## 2022-12-17 DIAGNOSIS — D696 Thrombocytopenia, unspecified: Secondary | ICD-10-CM | POA: Diagnosis not present

## 2022-12-17 DIAGNOSIS — E785 Hyperlipidemia, unspecified: Secondary | ICD-10-CM | POA: Diagnosis present

## 2022-12-17 DIAGNOSIS — R491 Aphonia: Secondary | ICD-10-CM | POA: Diagnosis present

## 2022-12-17 DIAGNOSIS — J9600 Acute respiratory failure, unspecified whether with hypoxia or hypercapnia: Secondary | ICD-10-CM

## 2022-12-17 DIAGNOSIS — Y92009 Unspecified place in unspecified non-institutional (private) residence as the place of occurrence of the external cause: Secondary | ICD-10-CM

## 2022-12-17 DIAGNOSIS — Z7901 Long term (current) use of anticoagulants: Secondary | ICD-10-CM

## 2022-12-17 DIAGNOSIS — R1310 Dysphagia, unspecified: Secondary | ICD-10-CM | POA: Diagnosis present

## 2022-12-17 DIAGNOSIS — K7460 Unspecified cirrhosis of liver: Secondary | ICD-10-CM | POA: Diagnosis present

## 2022-12-17 DIAGNOSIS — E1151 Type 2 diabetes mellitus with diabetic peripheral angiopathy without gangrene: Secondary | ICD-10-CM | POA: Diagnosis present

## 2022-12-17 DIAGNOSIS — J9601 Acute respiratory failure with hypoxia: Secondary | ICD-10-CM | POA: Diagnosis present

## 2022-12-17 DIAGNOSIS — R471 Dysarthria and anarthria: Secondary | ICD-10-CM | POA: Diagnosis present

## 2022-12-17 DIAGNOSIS — R402A Nontraumatic coma due to underlying condition: Secondary | ICD-10-CM | POA: Diagnosis not present

## 2022-12-17 DIAGNOSIS — Y838 Other surgical procedures as the cause of abnormal reaction of the patient, or of later complication, without mention of misadventure at the time of the procedure: Secondary | ICD-10-CM | POA: Diagnosis not present

## 2022-12-17 DIAGNOSIS — I130 Hypertensive heart and chronic kidney disease with heart failure and stage 1 through stage 4 chronic kidney disease, or unspecified chronic kidney disease: Secondary | ICD-10-CM | POA: Diagnosis present

## 2022-12-17 DIAGNOSIS — T45516A Underdosing of anticoagulants, initial encounter: Secondary | ICD-10-CM | POA: Diagnosis present

## 2022-12-17 DIAGNOSIS — Z79899 Other long term (current) drug therapy: Secondary | ICD-10-CM

## 2022-12-17 DIAGNOSIS — I6322 Cerebral infarction due to unspecified occlusion or stenosis of basilar arteries: Principal | ICD-10-CM | POA: Diagnosis present

## 2022-12-17 DIAGNOSIS — D751 Secondary polycythemia: Secondary | ICD-10-CM | POA: Diagnosis present

## 2022-12-17 DIAGNOSIS — I639 Cerebral infarction, unspecified: Secondary | ICD-10-CM | POA: Diagnosis present

## 2022-12-17 DIAGNOSIS — Z6831 Body mass index (BMI) 31.0-31.9, adult: Secondary | ICD-10-CM

## 2022-12-17 DIAGNOSIS — G8194 Hemiplegia, unspecified affecting left nondominant side: Secondary | ICD-10-CM | POA: Diagnosis present

## 2022-12-17 DIAGNOSIS — R4781 Slurred speech: Secondary | ICD-10-CM | POA: Diagnosis not present

## 2022-12-17 DIAGNOSIS — G9349 Other encephalopathy: Secondary | ICD-10-CM | POA: Diagnosis present

## 2022-12-17 DIAGNOSIS — H539 Unspecified visual disturbance: Secondary | ICD-10-CM | POA: Diagnosis present

## 2022-12-17 DIAGNOSIS — E876 Hypokalemia: Secondary | ICD-10-CM | POA: Diagnosis present

## 2022-12-17 DIAGNOSIS — J4489 Other specified chronic obstructive pulmonary disease: Secondary | ICD-10-CM | POA: Diagnosis not present

## 2022-12-17 DIAGNOSIS — Z807 Family history of other malignant neoplasms of lymphoid, hematopoietic and related tissues: Secondary | ICD-10-CM

## 2022-12-17 DIAGNOSIS — F419 Anxiety disorder, unspecified: Secondary | ICD-10-CM | POA: Diagnosis present

## 2022-12-17 DIAGNOSIS — Z7982 Long term (current) use of aspirin: Secondary | ICD-10-CM

## 2022-12-17 DIAGNOSIS — Z885 Allergy status to narcotic agent status: Secondary | ICD-10-CM

## 2022-12-17 DIAGNOSIS — I959 Hypotension, unspecified: Secondary | ICD-10-CM | POA: Diagnosis not present

## 2022-12-17 DIAGNOSIS — Z888 Allergy status to other drugs, medicaments and biological substances status: Secondary | ICD-10-CM

## 2022-12-17 DIAGNOSIS — Z833 Family history of diabetes mellitus: Secondary | ICD-10-CM

## 2022-12-17 DIAGNOSIS — G4733 Obstructive sleep apnea (adult) (pediatric): Secondary | ICD-10-CM | POA: Diagnosis present

## 2022-12-17 DIAGNOSIS — M542 Cervicalgia: Secondary | ICD-10-CM | POA: Diagnosis not present

## 2022-12-17 DIAGNOSIS — R29704 NIHSS score 4: Secondary | ICD-10-CM | POA: Diagnosis present

## 2022-12-17 LAB — DIFFERENTIAL
Abs Immature Granulocytes: 0.03 10*3/uL (ref 0.00–0.07)
Basophils Absolute: 0.1 10*3/uL (ref 0.0–0.1)
Basophils Relative: 1 %
Eosinophils Absolute: 0.1 10*3/uL (ref 0.0–0.5)
Eosinophils Relative: 2 %
Immature Granulocytes: 0 %
Lymphocytes Relative: 12 %
Lymphs Abs: 1.1 10*3/uL (ref 0.7–4.0)
Monocytes Absolute: 0.6 10*3/uL (ref 0.1–1.0)
Monocytes Relative: 6 %
Neutro Abs: 7.5 10*3/uL (ref 1.7–7.7)
Neutrophils Relative %: 79 %

## 2022-12-17 LAB — COMPREHENSIVE METABOLIC PANEL
ALT: 13 U/L (ref 0–44)
AST: 14 U/L — ABNORMAL LOW (ref 15–41)
Albumin: 3.9 g/dL (ref 3.5–5.0)
Alkaline Phosphatase: 57 U/L (ref 38–126)
Anion gap: 11 (ref 5–15)
BUN: 19 mg/dL (ref 6–20)
CO2: 20 mmol/L — ABNORMAL LOW (ref 22–32)
Calcium: 9.4 mg/dL (ref 8.9–10.3)
Chloride: 110 mmol/L (ref 98–111)
Creatinine, Ser: 1.92 mg/dL — ABNORMAL HIGH (ref 0.61–1.24)
GFR, Estimated: 39 mL/min — ABNORMAL LOW (ref >60)
Glucose, Bld: 129 mg/dL — ABNORMAL HIGH (ref 70–99)
Potassium: 3.6 mmol/L (ref 3.5–5.1)
Sodium: 141 mmol/L (ref 135–145)
Total Bilirubin: 0.9 mg/dL (ref 0.3–1.2)
Total Protein: 6.8 g/dL (ref 6.5–8.1)

## 2022-12-17 LAB — I-STAT CHEM 8, ED
BUN: 30 mg/dL — ABNORMAL HIGH (ref 6–20)
Calcium, Ion: 1 mmol/L — ABNORMAL LOW (ref 1.15–1.40)
Chloride: 111 mmol/L (ref 98–111)
Creatinine, Ser: 1.9 mg/dL — ABNORMAL HIGH (ref 0.61–1.24)
Glucose, Bld: 123 mg/dL — ABNORMAL HIGH (ref 70–99)
HCT: 52 % (ref 39.0–52.0)
Hemoglobin: 17.7 g/dL — ABNORMAL HIGH (ref 13.0–17.0)
Potassium: 4.5 mmol/L (ref 3.5–5.1)
Sodium: 142 mmol/L (ref 135–145)
TCO2: 22 mmol/L (ref 22–32)

## 2022-12-17 LAB — PROTIME-INR
INR: 1 (ref 0.8–1.2)
Prothrombin Time: 13.6 seconds (ref 11.4–15.2)

## 2022-12-17 LAB — CBC
HCT: 50.5 % (ref 39.0–52.0)
Hemoglobin: 16.9 g/dL (ref 13.0–17.0)
MCH: 30.3 pg (ref 26.0–34.0)
MCHC: 33.5 g/dL (ref 30.0–36.0)
MCV: 90.7 fL (ref 80.0–100.0)
Platelets: 170 10*3/uL (ref 150–400)
RBC: 5.57 MIL/uL (ref 4.22–5.81)
RDW: 13.2 % (ref 11.5–15.5)
WBC: 9.4 10*3/uL (ref 4.0–10.5)
nRBC: 0 % (ref 0.0–0.2)

## 2022-12-17 LAB — APTT: aPTT: 26 seconds (ref 24–36)

## 2022-12-17 LAB — ETHANOL: Alcohol, Ethyl (B): <10 mg/dL (ref <10)

## 2022-12-17 LAB — CBG MONITORING, ED: Glucose-Capillary: 113 mg/dL — ABNORMAL HIGH (ref 70–99)

## 2022-12-17 LAB — I-STAT CG4 LACTIC ACID, ED: Lactic Acid, Venous: 1.2 mmol/L (ref 0.5–1.9)

## 2022-12-17 MED ORDER — DULOXETINE HCL 30 MG PO CPEP
90.0000 mg | ORAL_CAPSULE | Freq: Every day | ORAL | Status: DC
  Filled 2022-12-17: qty 3

## 2022-12-17 MED ORDER — ACETAMINOPHEN 650 MG RE SUPP: 650.0000 mg | Freq: Four times a day (QID) | RECTAL | Status: DC | PRN

## 2022-12-17 MED ORDER — ALPRAZOLAM 0.5 MG PO TABS: 2.0000 mg | ORAL_TABLET | Freq: Every day | ORAL | Status: DC | PRN

## 2022-12-17 MED ORDER — RIVAROXABAN 10 MG PO TABS: 20.0000 mg | ORAL_TABLET | Freq: Every day | ORAL | Status: DC

## 2022-12-17 MED ORDER — DIAZEPAM 5 MG/ML IJ SOLN
2.5000 mg | Freq: Once | INTRAMUSCULAR | Status: AC
  Administered 2022-12-18: 2.5 mg via INTRAVENOUS
  Filled 2022-12-17: qty 2

## 2022-12-17 MED ORDER — ONDANSETRON HCL 4 MG PO TABS: 4.0000 mg | ORAL_TABLET | Freq: Four times a day (QID) | ORAL | Status: DC | PRN

## 2022-12-17 MED ORDER — STROKE: EARLY STAGES OF RECOVERY BOOK
Freq: Once | Status: AC
  Administered 2022-12-18: 1
  Filled 2022-12-17: qty 1

## 2022-12-17 MED ORDER — MELATONIN 5 MG PO TABS: 10.0000 mg | ORAL_TABLET | Freq: Every evening | ORAL | Status: DC | PRN

## 2022-12-17 MED ORDER — HYDRALAZINE HCL 20 MG/ML IJ SOLN
10.0000 mg | Freq: Four times a day (QID) | INTRAMUSCULAR | Status: DC | PRN
  Administered 2022-12-18: 10 mg via INTRAVENOUS
  Filled 2022-12-17: qty 1

## 2022-12-17 MED ORDER — ATORVASTATIN CALCIUM 10 MG PO TABS
10.0000 mg | ORAL_TABLET | Freq: Every day | ORAL | Status: DC
  Filled 2022-12-17: qty 1

## 2022-12-17 MED ORDER — HEPARIN (PORCINE) 25000 UT/250ML-% IV SOLN
1600.0000 [IU]/h | INTRAVENOUS | Status: AC
  Administered 2022-12-18: 1500 [IU]/h via INTRAVENOUS
  Administered 2022-12-18: 1700 [IU]/h via INTRAVENOUS
  Filled 2022-12-17 (×3): qty 250

## 2022-12-17 MED ORDER — ONDANSETRON HCL 4 MG/2ML IJ SOLN
4.0000 mg | Freq: Four times a day (QID) | INTRAMUSCULAR | Status: DC | PRN
  Administered 2022-12-18: 4 mg via INTRAVENOUS
  Filled 2022-12-17: qty 2

## 2022-12-17 MED ORDER — ACETAMINOPHEN 325 MG PO TABS: 650.0000 mg | ORAL_TABLET | Freq: Four times a day (QID) | ORAL | Status: DC | PRN

## 2022-12-17 MED ORDER — ENTECAVIR 0.5 MG PO TABS: 0.5000 mg | ORAL_TABLET | ORAL | Status: DC

## 2022-12-17 MED ORDER — MOMETASONE FURO-FORMOTEROL FUM 200-5 MCG/ACT IN AERO
2.0000 | INHALATION_SPRAY | Freq: Two times a day (BID) | RESPIRATORY_TRACT | Status: DC
  Administered 2022-12-18 – 2022-12-19 (×2): 2 via RESPIRATORY_TRACT
  Filled 2022-12-17: qty 8.8

## 2022-12-17 MED ORDER — ASPIRIN 81 MG PO TBEC
81.0000 mg | DELAYED_RELEASE_TABLET | Freq: Every day | ORAL | Status: DC
  Filled 2022-12-17 (×2): qty 1

## 2022-12-17 MED ORDER — SODIUM CHLORIDE 0.9% FLUSH
3.0000 mL | Freq: Once | INTRAVENOUS | Status: AC
  Administered 2022-12-17: 3 mL via INTRAVENOUS

## 2022-12-17 MED ORDER — TAMSULOSIN HCL 0.4 MG PO CAPS
0.4000 mg | ORAL_CAPSULE | Freq: Every day | ORAL | Status: DC
  Filled 2022-12-17 (×2): qty 1

## 2022-12-17 MED ORDER — EZETIMIBE 10 MG PO TABS
10.0000 mg | ORAL_TABLET | Freq: Every day | ORAL | Status: DC
  Filled 2022-12-17 (×3): qty 1

## 2022-12-17 NOTE — Assessment & Plan Note (Signed)
Stable

## 2022-12-17 NOTE — Assessment & Plan Note (Addendum)
Observation med/tele bed. Pt's AICD is not MRI compatible. Neurology consulted. They wanted to repeat his CT head. Check echo. Check lipids. Already on Xarelto for hx of afib. Pt still has significant dysarthria, bilateral UE and LE weakness. Will need ST/OT/PT.

## 2022-12-17 NOTE — H&P (Signed)
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  Patient's girlfriend states that he got caught up between the bed and the wall and fell down.  He refused EMS transport.  Several hours later he was noted to have continued weakness and bilateral upper and lower extremities.  He also had dysarthria.  Patient finally agreed to come to the hospital.  Arrival temp 98.1 heart rate 78 blood pressure 153/105 satting 98% on room air.  Weight was 114 kg.  BMI was 31.52.  Code stroke was activated.  TNKase was not given due to him being outside the window.  White count 9.4, hemoglobin 16.9, platelets of 170  Sodium 141, potassium 3.6, chloride of 100, bicarb of 20, BUN of 19, creatinine 1.92, glucose 129  CT head demonstrated no acute intracranial infarcts.  There is old infarct in the right parietal lobe and lacunar infarcts in bilateral basal ganglia.  CT C-spine negative for acute fractures.  Neurology was consulted when the code stroke was called.  Since the patient cannot get a MRI due to his AICD being not MRI compatible, repeat head CT was ordered.  [facility] consulted.   ED Course: CT head negative for acute CVA. AICD is not MRI compatible. TNKase not offered as pt was outside 3 hour window upon presentation.  Review of Systems:  Review of Systems  Constitutional: Negative.   HENT:  Negative.    Eyes: Negative.   Respiratory: Negative.    Cardiovascular: Negative.   Gastrointestinal: Negative.   Genitourinary: Negative.   Musculoskeletal: Negative.   Skin: Negative.   Neurological:  Positive for speech change, focal weakness and weakness.  Endo/Heme/Allergies: Negative.   Psychiatric/Behavioral: Negative.    All other systems reviewed and are negative.   Past Medical History:  Diagnosis Date   Anxiety    Arthritis    Benzodiazepine dependence (HCC)    Bronchial asthma    CAD (coronary artery disease)    a. Cath 03/2010: mod RCA stenosis, severe diag stenosis, treated medically given lack of angina.   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 Episode in 2008 with spontaneous conversion, on amiodarone per EP.   S/P implantation of automatic cardioverter/defibrillator (AICD)    a. Medtronic, implanted 2012.   Sleep apnea    Stroke [facility]) 2004   Thyroid disease  Past Surgical History:  Procedure Laterality Date   CARDIOVERSION N/A 01/07/2014   Procedure: CARDIOVERSION;  Surgeon: Lewayne Bunting, MD;  Location[facility] ENDOSCOPY;  Service: Cardiovascular;  Laterality: N/A;   CARDIOVERSION N/A 02/11/2014   Procedure: CARDIOVERSION;  Surgeon: Chrystie Nose, MD;  Location[facility] ENDOSCOPY;  Service: Cardiovascular;  Laterality: N/A;   CARDIOVERSION N/A 10/01/2020   Procedure: CARDIOVERSION;  Surgeon: Christell Constant, MD;  Location: [facility] ENDOSCOPY;  Service: Cardiovascular;  Laterality: N/A;    CATARACT EXTRACTION     COLONOSCOPY WITH PROPOFOL N/A 02/06/2018   Procedure: COLONOSCOPY WITH PROPOFOL;  Surgeon: Benancio Deeds, MD;  Location: [facility] ENDOSCOPY;  Service: Gastroenterology;  Laterality: N/A;   EYE SURGERY     ICD GENERATOR CHANGEOUT N/A 01/17/2020   Procedure: ICD GENERATOR CHANGEOUT;  Surgeon: Marinus Maw, MD;  Location: [facility] INVASIVE CV LAB;  Service: Cardiovascular;  Laterality: N/A;   LASEK eye surgery Bilateral 05/2018   LASIK Bilateral    PACEMAKER INSERTION     POLYPECTOMY  02/06/2018   Procedure: POLYPECTOMY;  Surgeon: Benancio Deeds, MD;  Location: [facility] ENDOSCOPY;  Service: Gastroenterology;;   TEE WITHOUT CARDIOVERSION N/A 01/07/2014   Procedure: TRANSESOPHAGEAL ECHOCARDIOGRAM (TEE);  Surgeon: Lewayne Bunting, MD;  Location: [facility] ENDOSCOPY;  Service: Cardiovascular;  Laterality: N/A;   TEE WITHOUT CARDIOVERSION N/A 02/11/2014   Procedure: TRANSESOPHAGEAL ECHOCARDIOGRAM (TEE);  Surgeon: Chrystie Nose, MD;  Location: [facility] ENDOSCOPY;  Service: Cardiovascular;  Laterality: N/A;   TEE WITHOUT CARDIOVERSION N/A 10/01/2020   Procedure: TRANSESOPHAGEAL ECHOCARDIOGRAM (TEE);  Surgeon: Christell Constant, MD;  Location: [facility] ENDOSCOPY;  Service: Cardiovascular;  Laterality: N/A;   TONSILLECTOMY     TRANSTHORACIC ECHOCARDIOGRAM  10/2006, 12/2009     reports that he quit smoking about 12 years ago. His smoking use included cigarettes. He started smoking about 13 years ago. He has a 0.2 pack-year smoking history. He has never used smokeless tobacco. He reports that he does not currently use alcohol. He reports that he does not use drugs.  Allergies  Allergen Reactions   Prochlorperazine Other (See Comments)    Medication caused a lot of issues like blurred vision, nausea, pain, and carpool turnel   Codeine Itching, Nausea And Vomiting and Hives   Hydrocodone-Acetaminophen Itching and Nausea And Vomiting    Family History  Problem Relation Age of Onset    Breast cancer Mother    Multiple myeloma Mother    Prostate cancer Father    Diabetes Father    Peripheral vascular disease Father    Cerebral palsy Daughter    Lung disease Neg Hx    Colon cancer Neg Hx    Stomach cancer Neg Hx    Esophageal cancer Neg Hx    Colon polyps Neg Hx     Prior to Admission medications   Medication Sig Start Date End Date Taking? Authorizing Provider  acetaminophen (TYLENOL) 500 MG tablet Take 1,000 mg by mouth every 6 (six) hours as needed for moderate pain or headache.    [provider]  albuterol (VENTOLIN HFA) 108 (90 Base) MCG/ACT inhaler INHALE 2 PUFFS INTO THE LUNGS EVERY 6 HOURS AS NEEDED FOR WHEEZING OR SHORTNESS OF BREATH 12/13/22   Marcine Matar, MD  alprazolam Prudy Feeler) 2 MG tablet Take 2 mg by mouth daily as needed for anxiety.    [provider]  amiodarone (PACERONE) 200 MG tablet Take 1 tablet (200 mg total) by mouth daily. 12/09/21   Camnitz, Andree Coss, MD  Artificial Tear Solution (TEARS NATURALE OP) Place 1 drop into both eyes daily as needed (dry eyes).    [provider]  aspirin EC 81 MG tablet Take 81 mg by mouth daily. Swallow whole.    [provider]  atorvastatin (LIPITOR) 10 MG tablet TAKE 1 TABLET(10 MG) BY MOUTH DAILY 11/21/22   Tonny Bollman, MD  carvedilol (COREG) 12.5 MG tablet TAKE 1 AND 1/2 TABLETS BY MOUTH TWICE DAILY 12/13/22   Marcine Matar, MD  dapagliflozin propanediol (FARXIGA) 10 MG TABS tablet Take 1 tablet (10 mg total) by mouth daily before breakfast. 04/14/22   Tonny Bollman, MD  DULoxetine (CYMBALTA) 30 MG capsule Take 90 mg by mouth daily with breakfast.    [provider]  entecavir (BARACLUDE) 0.5 MG tablet Take 1 tablet (0.5 mg total) by mouth every other day. 08/16/22   Armbruster, Willaim Rayas, MD  ENTRESTO 49-51 MG TAKE 1 TABLET BY MOUTH TWICE DAILY 02/15/22   Tonny Bollman, MD  ezetimibe (ZETIA) 10 MG tablet TAKE 1 TABLET(10 MG) BY MOUTH DAILY 08/29/22    Tonny Bollman, MD  fluticasone [facility]) 50 MCG/ACT nasal spray SHAKE LIQUID AND USE 1 SPRAY IN [facility] NOSTRIL DAILY 12/13/22   Marcine Matar, MD  furosemide (LASIX) 40 MG tablet TAKE 1/2 TABLET(20 MG) BY MOUTH DAILY 12/29/21   Tonny Bollman, MD  hydrALAZINE (APRESOLINE) 100 MG tablet TAKE 1 TABLET(100 MG) BY MOUTH IN THE MORNING AND AT BEDTIME 04/26/22   Nahser, Deloris Ping, MD  ondansetron (ZOFRAN-ODT) 8 MG disintegrating tablet Take 8 mg by mouth daily. 12/11/19   [provider]  oxybutynin (DITROPAN) 5 MG tablet TAKE 1 TABLET(5 MG) BY MOUTH THREE TIMES DAILY 04/08/22   Marcine Matar, MD  polyethylene glycol (MIRALAX) 17 g packet Take 17 g by mouth 2 (two) times daily. Take as directed, Daily, titrate as needed 04/08/22   Armbruster, Willaim Rayas, MD  Rectal Protectant-Emollient (CALMOL-4) 76-10 % SUPP Use as directed once to twice daily 04/08/22   Armbruster, Willaim Rayas, MD  RESTASIS 0.05 % ophthalmic emulsion Place 1 drop into both eyes in the morning. 11/25/21   [provider]  sildenafil (REVATIO) 20 MG tablet Take 1 tablet (20 mg total) by mouth as needed. 12/03/21   Tonny Bollman, MD  SYMBICORT 160-4.5 MCG/ACT inhaler INHALE 2 PUFFS INTO THE LUNGS TWICE DAILY 11/10/21   Marcine Matar, MD  tamsulosin (FLOMAX) 0.4 MG CAPS capsule TAKE 1 CAPSULE(0.4 MG) BY MOUTH DAILY 10/25/22   Marcine Matar, MD  XARELTO 20 MG TABS tablet TAKE 1 TABLET(20 MG) BY MOUTH DAILY WITH SUPPER 09/28/22   Camnitz, Andree Coss, MD  zaleplon (SONATA) 10 MG capsule Take 10 mg by mouth at bedtime. 09/20/22   [provider]    Physical Exam: Vitals:   12/17/22 1754 12/17/22 1824 12/17/22 1900 12/17/22 1915  BP:  (!) 153/105 (!) 189/113 (!) 193/130  Pulse:   78 77  Resp:  16 20 15   Temp:  98.1 F (36.7 C)    TempSrc:  Oral    SpO2:  98% 93% 96%  Weight: 114.4 kg       Physical Exam Vitals and nursing note reviewed.  Constitutional:      General: He is not in acute distress.     Appearance: He is obese. He is not toxic-appearing or diaphoretic.  HENT:     Head: Normocephalic and atraumatic.     Nose: Nose normal.  Eyes:  Pupils: Pupils are equal, round, and reactive to light.  Cardiovascular:     Rate and Rhythm: Normal rate and regular rhythm.  Pulmonary:     Effort: Pulmonary effort is normal. No respiratory distress.     Breath sounds: Normal breath sounds. No wheezing.  Abdominal:     General: Abdomen is protuberant. Bowel sounds are normal. There is no distension.     Palpations: Abdomen is soft.     Tenderness: There is no abdominal tenderness.  Musculoskeletal:     Right lower leg: No edema.     Left lower leg: No edema.  Skin:    General: Skin is warm and dry.     Capillary Refill: Capillary refill takes less than 2 seconds.  Neurological:     Mental Status: He is alert and oriented to person, place, and time.     Motor: Weakness present.     Coordination: Finger-Nose-Finger Test abnormal.     Comments: +dysarthria 4/5 bilateral hand grip strength      Labs on Admission: I have personally reviewed following labs and imaging studies  CBC: Recent Labs  Lab 12/17/22 1748 12/17/22 1753  WBC 9.4  --   NEUTROABS 7.5  --   HGB 16.9 17.7*  HCT 50.5 52.0  MCV 90.7  --   PLT 170  --    Basic Metabolic Panel: Recent Labs  Lab 12/17/22 1748 12/17/22 1753  NA 141 142  K 3.6 4.5  CL 110 111  CO2 20*  --   GLUCOSE 129* 123*  BUN 19 30*  CREATININE 1.92* 1.90*  CALCIUM 9.4  --    GFR: Estimated Creatinine Clearance: 56.4 mL/min (A) (by C-G formula based on SCr of 1.9 mg/dL (H)). Liver Function Tests: Recent Labs  Lab 12/17/22 1748  AST 14*  ALT 13  ALKPHOS 57  BILITOT 0.9  PROT 6.8  ALBUMIN 3.9   Coagulation Profile: Recent Labs  Lab 12/17/22 1748  INR 1.0   CBG: Recent Labs  Lab 12/17/22 1751  GLUCAP 113*   Radiological Exams on Admission: I have personally reviewed images CT CERVICAL SPINE WO CONTRAST  Result  Date: 12/17/2022 CLINICAL DATA:  Pain EXAM: CT CERVICAL SPINE WITHOUT CONTRAST TECHNIQUE: Multidetector CT imaging of the cervical spine was performed without intravenous contrast. Multiplanar CT image reconstructions were also generated. RADIATION DOSE REDUCTION: This exam was performed according to the departmental dose-optimization program which includes automated exposure control, adjustment of the mA and/or kV according to patient size and/or use of iterative reconstruction technique. COMPARISON:  Cervical spine CT 09/19/2022 FINDINGS: Alignment: Normal. Skull base and vertebrae: No acute fracture. No primary bone lesion or focal pathologic process. Soft tissues and spinal canal: No prevertebral fluid or swelling. No visible canal hematoma. Disc levels: There is mild disc space narrowing and endplate osteophyte formation at C5-C6 and C6-C7. No significant central canal or neural foraminal stenosis at any level. Upper chest: Negative. Other: There are atherosclerotic calcifications of the bilateral carotid artery bifurcations. IMPRESSION: No acute fracture or traumatic subluxation of the cervical spine. Electronically Signed   By: Darliss Cheney M.D.   On: 12/17/2022 18:17   CT HEAD CODE STROKE WO CONTRAST  Result Date: 12/17/2022 CLINICAL DATA:  Code stroke. Neuro deficit, acute, stroke suspected. EXAM: CT HEAD WITHOUT CONTRAST TECHNIQUE: Contiguous axial images were obtained from the base of the skull through the vertex without intravenous contrast. RADIATION DOSE REDUCTION: This exam was performed according to the departmental dose-optimization program which includes  automated exposure control, adjustment of the mA and/or kV according to patient size and/or use of iterative reconstruction technique. COMPARISON:  Head CT 09/19/2022. FINDINGS: Brain: No acute hemorrhage. 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 No new loss of gray-white  differentiation. No hydrocephalus or extra-axial collection. No mass effect or midline shift. Vascular: No hyperdense vessel or unexpected calcification. Skull: No calvarial fracture or suspicious bone lesion. Skull base is unremarkable. Sinuses/Orbits: No acute finding. Other: None. ASPECTS [facility] Stroke Program Early CT Score) - Ganglionic level infarction (caudate, lentiform nuclei, internal capsule, insula, M1-M3 cortex): 7 - Supraganglionic infarction (M4-M6 cortex): 3 Total score (0-10 with 10 being normal): 10 IMPRESSION: 1. No acute intracranial hemorrhage or evidence of acute large vessel territory infarct. ASPECT score is 10. 2. 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 Code stroke imaging results were communicated on 12/17/2022 at 6:06 pm to provider Dr. Selina Cooley via secure text paging. Electronically Signed   By: Orvan Falconer M.D.   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 Pt's AICD is not MRI compatible. Neurology consulted. They wanted to repeat his CT head. Check echo. Check lipids. Already on Xarelto for hx of afib. Pt still has significant dysarthria, bilateral UE and LE weakness. Will need ST/OT/PT.  Stage 3b chronic kidney disease (HCC) Stable.  COPD with asthma Stable.  Obstructive sleep apnea Stable.  Atrial  fibrillation (HCC) Stable. On Xarelto.  Automatic implantable cardioverter-defibrillator in situ - NOT MRI compatible Chronic. AICD not MRI compatible.  CAD, NATIVE VESSEL Stable. On ASA 81 mg daily.  Chronic systolic heart failure (HCC) Stable. Euvolemic.  Essential hypertension Allow for permissive HTN. Keep BP <220/110 for now.  HLD (hyperlipidemia) Check lipid panel. Continue with zetia, lipitor.  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   DVT prophylaxis: Xarelto Code Status: Full Code Family Communication: discussed with pt and SO(Sandra) Disposition Plan: return home with HH vs SNF  Consults called: neurology(stacks)  Admission status: Observation, Telemetry bed   Carollee Herter, DO [facility] 12/17/2022, 7:42 PM

## 2022-12-17 NOTE — Assessment & Plan Note (Signed)
Chronic. AICD not MRI compatible.

## 2022-12-17 NOTE — Assessment & Plan Note (Signed)
Check lipid panel. Continue with zetia, lipitor.

## 2022-12-17 NOTE — Subjective & Objective (Signed)
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  Patient's girlfriend states that he got caught up between the bed and the wall and fell down.  He refused EMS transport.  Several hours later he was noted to have continued weakness and bilateral upper and lower extremities.  He also had dysarthria.  Patient finally agreed to come to the hospital.  Arrival temp 98.1 heart rate 78 blood pressure 153/105 satting 98% on room air.  Weight was 114 kg.  BMI was 31.52.  Code stroke was activated.  TNKase was not given due to him being outside the window.  White count 9.4, hemoglobin 16.9, platelets of 170  Sodium 141, potassium 3.6, chloride of 100, bicarb of 20, BUN of 19, creatinine 1.92, glucose 129  CT head demonstrated no acute intracranial infarcts.  There is old infarct in the right parietal lobe and lacunar infarcts in bilateral basal ganglia.  CT C-spine negative for acute fractures.  Neurology was consulted when the code stroke was called.  Since the patient cannot get a MRI due to his AICD being not MRI compatible, repeat head CT was ordered.  [facility] consulted.

## 2022-12-17 NOTE — Progress Notes (Addendum)
Bedside nurse reported that patient failed bedside swallow screen and he cannot give anything by mouth tonight. - Patient has history of atrial fibrillation at home he is on Xarelto 20 mg daily given patient failed swallow screen cannot give Xarelto for now. - Starting heparin drip with pharmacy consult for the management of atrial fibrillation - Continue permissive hypertension for 24-hours, ordered  hydralazine 10 mg every 6 hour as needed for systolic blood pressure more than 220 or diastolic blood pressure more than 110.   Tereasa Coop, MD [facility] 12/17/2022, 10:36 PM

## 2022-12-17 NOTE — Assessment & Plan Note (Signed)
Stable. Euvolemic. ?

## 2022-12-17 NOTE — Assessment & Plan Note (Signed)
Allow for permissive HTN. Keep BP <220/110 for now.

## 2022-12-17 NOTE — Assessment & Plan Note (Signed)
Stable.  On Xarelto

## 2022-12-17 NOTE — Assessment & Plan Note (Signed)
Stable. On ASA 81 mg daily.

## 2022-12-17 NOTE — Progress Notes (Signed)
   12/17/22 1825  Spiritual Encounters  Type of Visit Initial;Declined chaplain visit  Care provided to: Pt not available  Conversation partners present during encounter Nurse  Referral source Trauma page  Reason for visit Trauma  OnCall Visit Yes   Chaplain responded to a trauma in the ED - level II, fall. Patient states he has no needs at this time. Spiritual care services available as needed.   Alda Ponder, Chaplain 12/17/22

## 2022-12-17 NOTE — Progress Notes (Addendum)
ANTICOAGULATION CONSULT NOTE - Initial Consult  Pharmacy Consult for Xarelto > heparin Indication: atrial fibrillation  Allergies  Allergen Reactions   Prochlorperazine Other (See Comments)    Medication caused a lot of issues like blurred vision, nausea, pain, and carpool turnel   Codeine Itching, Nausea And Vomiting and Hives   Hydrocodone-Acetaminophen Itching and Nausea And Vomiting    Patient Measurements: Height: 6\' 3"  (190.5 cm) Weight: 114.4 kg (252 lb 3.3 oz) IBW/kg (Calculated) : 84.5 Heparin Dosing Weight: 108.3 kg   Vital Signs: Temp: 98.1 F (36.7 C) (08/17 1824) Temp Source: Oral (08/17 1824) BP: 193/130 (08/17 1915) Pulse Rate: 77 (08/17 1915)  Labs: Recent Labs    12/17/22 1748 12/17/22 1753  HGB 16.9 17.7*  HCT 50.5 52.0  PLT 170  --   APTT 26  --   LABPROT 13.6  --   INR 1.0  --   CREATININE 1.92* 1.90*    Estimated Creatinine Clearance: 56.4 mL/min (A) (by C-G formula based on SCr of 1.9 mg/dL (H)).   Medical History: Past Medical History:  Diagnosis Date   Anxiety    Arthritis    Benzodiazepine dependence (HCC)    Bronchial asthma    CAD (coronary artery disease)    a. Cath 03/2010: mod RCA stenosis, severe diag stenosis, treated medically given lack of angina.   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 Episode in 2008 with spontaneous conversion, on amiodarone per EP.   S/P implantation of automatic cardioverter/defibrillator (AICD)    a. Medtronic, implanted 2012.   Sleep  apnea    Stroke [facility]) 2004   Thyroid disease     Medications:  Scheduled:   [START ON 12/18/2022]  stroke: early stages of recovery book   Does not apply Once   aspirin EC  81 mg Oral Daily   atorvastatin  10 mg Oral Daily   [START ON 12/18/2022] DULoxetine  90 mg Oral Q breakfast   entecavir  0.5 mg Oral QODAY   ezetimibe  10 mg Oral Daily   mometasone-formoterol  2 puff Inhalation BID   sodium chloride flush  3 mL Intravenous Once   [START ON 12/18/2022] tamsulosin  0.4 mg Oral Daily    Assessment: 62 yom presenting with weakness/dysarthria, CT showing no acute infarcts but old infarct - on Xarelto PTA for hx Afib (LD 8/16 PM). Did not pass bedside swallow exam so will bridge with heparin for now.   Hgb 17.7, plt 170. No s/sx of bleeding.  Given recent DOAC, will monitor both aPTT and heparin level until correlate.   Goal of Therapy:  Heparin level 0.3-0.7 units/ml aPTT 66-102 seconds Monitor platelets by anticoagulation protocol: Yes   Plan:  Start heparin infusion at 1500 units/hr  Order levels in 6 hrs  Monitor daily HL/aPTT until correlate, CBC, and s/sx of bleeding   Thank you for allowing pharmacy to participate in this patient's  care,  Sherron Monday, PharmD, BCCCP Clinical Pharmacist  Phone: [phone] 12/17/2022 10:51 PM  Please check AMION for all [facility] phone numbers After 10:00 PM, call Main Pharmacy [phone]

## 2022-12-17 NOTE — Consult Note (Signed)
NEUROLOGY CONSULTATION NOTE   Date of service: December 17, 2022 Patient Name: Cody Ortega MRN:  [id number] DOB:  [birth year] Reason for consult: stroke code Requesting physician: Dr. Melene Plan _ _ _   _ __   _ __ _ _  __ __   _ __   __ _  History of Present Illness   This is a 62 yo man with pmhx significant for CAD, CHF, CKD stage IV, hx cocaine and benzodiazepine abuse, HL, HTN, a fib on xarelto, prior stroke who presents with L sided weakness. LKW 7:30 AM.  Around 8:00AM he states that he had a mechanical fall after which he had left-sided weakness in his face arm and leg.  He is tearful and very scared.  He hates needles and doctors and refused to let his family come bring him to the ER for several hours.  NIH stroke scale is 4 for mild weakness of the left face, arm, leg as well as dysarthria.  CT head showed no acute process within aspects of 8.  TNK was not administered due to presentation outside the window and contraindication of being on Xarelto.  CTA head and neck was not performed as part of the stroke code because his exam was not consistent with LVO.   ROS   Per HPI: all other systems reviewed and are negative  Past History   I have reviewed the following:  Past Medical History:  Diagnosis Date   Anxiety    Arthritis    Benzodiazepine dependence (HCC)    Bronchial asthma    CAD (coronary artery disease)    a. Cath 03/2010: mod RCA stenosis, severe diag stenosis, treated medically given lack of angina.   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 Episode in 2008 with spontaneous conversion, on amiodarone per EP.   S/P implantation of automatic cardioverter/defibrillator (AICD)    a. Medtronic, implanted 2012.   Sleep apnea    Stroke [facility]) 2004   Thyroid disease    Past Surgical History:  Procedure Laterality Date   CARDIOVERSION N/A 01/07/2014   Procedure: CARDIOVERSION;  Surgeon: Lewayne Bunting, MD;  Location: [facility] ENDOSCOPY;  Service: Cardiovascular;  Laterality: N/A;   CARDIOVERSION N/A 02/11/2014   Procedure: CARDIOVERSION;  Surgeon: Chrystie Nose, MD;  Location: [facility] ENDOSCOPY;  Service: Cardiovascular;  Laterality: N/A;   CARDIOVERSION N/A 10/01/2020   Procedure: CARDIOVERSION;  Surgeon: Christell Constant, MD;  Location: [facility] ENDOSCOPY;  Service: Cardiovascular;  Laterality: N/A;   CATARACT EXTRACTION     COLONOSCOPY WITH PROPOFOL N/A 02/06/2018   Procedure: COLONOSCOPY WITH PROPOFOL;  Surgeon: Benancio Deeds, MD;  Location: [facility] ENDOSCOPY;  Service: Gastroenterology;  Laterality: N/A;   EYE SURGERY     ICD GENERATOR CHANGEOUT N/A 01/17/2020   Procedure: ICD GENERATOR CHANGEOUT;  Surgeon: Marinus Maw, MD;  Location: [facility] INVASIVE CV LAB;  Service: Cardiovascular;  Laterality: N/A;  LASEK eye surgery Bilateral 05/2018   LASIK Bilateral    PACEMAKER INSERTION     POLYPECTOMY  02/06/2018   Procedure: POLYPECTOMY;  Surgeon: Benancio Deeds, MD;  Location: [facility] ENDOSCOPY;  Service: Gastroenterology;;   TEE WITHOUT CARDIOVERSION N/A 01/07/2014   Procedure: TRANSESOPHAGEAL ECHOCARDIOGRAM (TEE);  Surgeon: Lewayne Bunting, MD;  Location: [facility] ENDOSCOPY;  Service: Cardiovascular;  Laterality: N/A;   TEE WITHOUT CARDIOVERSION N/A 02/11/2014   Procedure: TRANSESOPHAGEAL ECHOCARDIOGRAM (TEE);  Surgeon: Chrystie Nose, MD;  Location: [facility] ENDOSCOPY;  Service: Cardiovascular;  Laterality: N/A;   TEE WITHOUT CARDIOVERSION N/A 10/01/2020   Procedure: TRANSESOPHAGEAL ECHOCARDIOGRAM  (TEE);  Surgeon: Christell Constant, MD;  Location: [facility] ENDOSCOPY;  Service: Cardiovascular;  Laterality: N/A;   TONSILLECTOMY     TRANSTHORACIC ECHOCARDIOGRAM  10/2006, 12/2009   Family History  Problem Relation Age of Onset   Breast cancer Mother    Multiple myeloma Mother    Prostate cancer Father    Diabetes Father    Peripheral vascular disease Father    Cerebral palsy Daughter    Lung disease Neg Hx    Colon cancer Neg Hx    Stomach cancer Neg Hx    Esophageal cancer Neg Hx    Colon polyps Neg Hx    Social History   Socioeconomic History   Marital status: Divorced    Spouse name: Not on file   Number of children: 2   Years of education: Not on file   Highest education level: Not on file  Occupational History   Occupation: Unemployed    Employer: UNEMPLOYED    Comment: Corporate treasurer in the past  Tobacco Use   Smoking status: Former    Current packs/day: 0.00    Average packs/day: 0.5 packs/day for 0.5 years (0.2 ttl pk-yrs)    Types: Cigarettes    Start date: 10/31/2009    Quit date: 05/02/2010    Years since quitting: 12.6   Smokeless tobacco: Never   Tobacco comments:    Former smoker Significant Second-hand and only 3 months himself 01/05/22  Vaping Use   Vaping status: Never Used  Substance and Sexual Activity   Alcohol use: Not Currently    Alcohol/week: 0.0 standard drinks of alcohol    Comment: rare   Drug use: No    Comment: Reported history of cocaine abuse off and on    Sexual activity: Not on file  Other Topics Concern   Not on file  Social History Narrative   Living with a son who is a 68 year old.  He is not     working, unemployed for 3 years.  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  Denied use of alcohol.  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       [facility]:   Originally from [place], [place]. He grew up in [place]. He moved to [place] in 1985. 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 No pets currently. No bird exposure. He did have mold in a previous home. No hot tub exposure.         Social Determinants of Health   Financial Resource Strain: Not on file  Food Insecurity: Not on file  Transportation Needs: Not on file  Physical Activity: Not on file  Stress: Not on file  Social Connections: Not on file   Allergies  Allergen Reactions   Prochlorperazine Other (  See Comments)    Medication caused a lot of issues like blurred vision, nausea, pain, and carpool turnel   Codeine Itching, Nausea And Vomiting and Hives   Hydrocodone-Acetaminophen Itching and Nausea And Vomiting    Medications   (Not in a hospital admission)     Current Facility-Administered Medications:    sodium chloride flush (NS) 0.9 % injection 3 mL, 3 mL, Intravenous, Once, Melene Plan, DO  Current Outpatient Medications:    acetaminophen (TYLENOL) 500 MG tablet, Take 1,000 mg by mouth every 6 (six) hours as needed for moderate pain or headache., Disp: , Rfl:    albuterol (VENTOLIN HFA) 108 (90 Base) MCG/ACT inhaler, INHALE 2 PUFFS INTO THE LUNGS EVERY 6 HOURS AS NEEDED FOR WHEEZING OR SHORTNESS OF BREATH, Disp: 18 g, Rfl: 1   alprazolam (XANAX) 2 MG tablet, Take 2 mg by mouth daily as needed for anxiety., Disp: , Rfl:    amiodarone (PACERONE) 200 MG tablet, Take 1 tablet (200 mg total) by mouth daily., Disp: 90 tablet, Rfl: 2   Artificial Tear Solution (TEARS NATURALE OP), Place 1 drop into both eyes daily as needed (dry eyes)., Disp: , Rfl:    aspirin EC 81 MG tablet, Take 81 mg by mouth daily. Swallow whole., Disp: , Rfl:    atorvastatin (LIPITOR) 10 MG tablet, TAKE 1 TABLET(10 MG) BY MOUTH DAILY, Disp: 90 tablet, Rfl: 3   carvedilol (COREG) 12.5 MG tablet, TAKE 1 AND 1/2 TABLETS BY MOUTH TWICE DAILY, Disp: 270 tablet, Rfl: 0   dapagliflozin propanediol (FARXIGA) 10 MG TABS tablet, Take 1 tablet (10 mg total) by mouth daily before breakfast., Disp: 90 tablet,  Rfl: 3   DULoxetine (CYMBALTA) 30 MG capsule, Take 90 mg by mouth daily with breakfast., Disp: , Rfl:    entecavir (BARACLUDE) 0.5 MG tablet, Take 1 tablet (0.5 mg total) by mouth every other day., Disp: 45 tablet, Rfl: 1   ENTRESTO 49-51 MG, TAKE 1 TABLET BY MOUTH TWICE DAILY, Disp: 180 tablet, Rfl: 1   ezetimibe (ZETIA) 10 MG tablet, TAKE 1 TABLET(10 MG) BY MOUTH DAILY, Disp: 90 tablet, Rfl: 2   fluticasone (FLONASE) 50 MCG/ACT nasal spray, SHAKE LIQUID AND USE 1 SPRAY IN EACH NOSTRIL DAILY, Disp: 16 g, Rfl: 0   furosemide (LASIX) 40 MG tablet, TAKE 1/2 TABLET(20 MG) BY MOUTH DAILY, Disp: 45 tablet, Rfl: 3   hydrALAZINE (APRESOLINE) 100 MG tablet, TAKE 1 TABLET(100 MG) BY MOUTH IN THE MORNING AND AT BEDTIME, Disp: 180 tablet, Rfl: 3   ondansetron (ZOFRAN-ODT) 8 MG disintegrating tablet, Take 8 mg by mouth daily., Disp: , Rfl:    oxybutynin (DITROPAN) 5 MG tablet, TAKE 1 TABLET(5 MG) BY MOUTH THREE TIMES DAILY, Disp: 90 tablet, Rfl: 0   polyethylene glycol (MIRALAX) 17 g packet, Take 17 g by mouth 2 (two) times daily. Take as directed, Daily, titrate as needed, Disp: 14 each, Rfl: 0   Rectal Protectant-Emollient (CALMOL-4) 76-10 % SUPP, Use as directed once to twice daily, Disp: 4 suppository, Rfl: 0   RESTASIS 0.05 % ophthalmic emulsion, Place 1 drop into both eyes in the morning., Disp: , Rfl:    sildenafil (REVATIO) 20 MG tablet, Take 1 tablet (20 mg total) by mouth as needed., Disp: 90 tablet, Rfl: 0   SYMBICORT 160-4.5 MCG/ACT inhaler, INHALE 2 PUFFS INTO THE LUNGS TWICE DAILY, Disp: 10.2 g, Rfl: 2   tamsulosin (FLOMAX) 0.4 MG CAPS capsule, TAKE 1 CAPSULE(0.4 MG) BY MOUTH DAILY, Disp: 90 capsule, Rfl: 0  XARELTO 20 MG TABS tablet, TAKE 1 TABLET(20 MG) BY MOUTH DAILY WITH SUPPER, Disp: 90 tablet, Rfl: 1   zaleplon (SONATA) 10 MG capsule, Take 10 mg by mouth at bedtime., Disp: , Rfl:   Vitals   Vitals:   12/17/22 1754 12/17/22 1824  BP:  (!) 153/105  Resp:  16  Temp:  98.1 F (36.7 C)   TempSrc:  Oral  SpO2:  98%  Weight: 114.4 kg      Body mass index is 31.52 kg/m.  Physical Exam  Exam  Vitals:   12/17/22 1824  BP: (!) 153/105  Resp: 16  Temp: 98.1 F (36.7 C)  SpO2: 98%    Gen: patient lying in bed, NAD CV: extremities appear well-perfused Resp: normal WOB  Neurologic exam MS: alert, oriented x4, follows commands Speech: mild dysarthria, no aphasia CN: PERRL, VFF, EOMI, sensation intact, L facial droop, hearing intact to voice Motor: Mild drift not to bed LUE and LLE, RUE and RLE full strength without drift Sensory: SILT Reflexes: 2+ symm with toes down bilat Coordination: FNF intact bilat Gait: deferred   NIHSS  1a Level of Conscious.: 0 1b LOC Questions: 0 1c LOC Commands: 0 2 Best Gaze: 0 3 Visual: 0 4 Facial Palsy: 1 5a Motor Arm - left: 1 5b Motor Arm - Right: 0 6a Motor Leg - Left: 1 6b Motor Leg - Right: 0 7 Limb Ataxia: 0 8 Sensory: 0 9 Best Language: 0 10 Dysarthria: 1 11 Extinct. and Inatten.: 0  TOTAL: 4   Premorbid mRS = 1   Labs   CBC:  Recent Labs  Lab 12/17/22 1748 12/17/22 1753  WBC 9.4  --   NEUTROABS 7.5  --   HGB 16.9 17.7*  HCT 50.5 52.0  MCV 90.7  --   PLT 170  --     Basic Metabolic Panel:  Lab Results  Component Value Date   NA 142 12/17/2022   K 4.5 12/17/2022   CO2 20 (L) 12/17/2022   GLUCOSE 123 (H) 12/17/2022   BUN 30 (H) 12/17/2022   CREATININE 1.90 (H) 12/17/2022   CALCIUM 9.4 12/17/2022   GFRNONAA 39 (L) 12/17/2022   GFRAA 36 (L) 06/11/2020   Lipid Panel:  Lab Results  Component Value Date   LDLCALC 138 (H) 05/08/2020   HgbA1c:  Lab Results  Component Value Date   HGBA1C 5.6 12/09/2020   Urine Drug Screen:     Component Value Date/Time   LABOPIA NONE DETECTED 10/03/2020 1025   COCAINSCRNUR NONE DETECTED 10/03/2020 1025   LABBENZ POSITIVE (A) 10/03/2020 1025   AMPHETMU NONE DETECTED 10/03/2020 1025   THCU POSITIVE (A) 10/03/2020 1025   LABBARB NONE DETECTED  10/03/2020 1025    Alcohol Level     Component Value Date/Time   ETH <10 12/17/2022 1748    CT Head without contrast: Stable chronic findings, no acute process, no ICH, ASPECTS 8  Impression   This is a 62 yo man with pmhx significant for CAD, CHF, CKD stage IV, hx cocaine and benzodiazepine abuse, HL, HTN, a fib on xarelto, prior stroke who presents with mild L sided weakness of face/arm/leg after a fall at 0800. NIHSS = 4. CT head showed no acute process within aspects of 8.  TNK was not administered due to presentation outside the window and contraindication of being on Xarelto.  CTA head and neck was not performed as part of the stroke code because his exam was not consistent with LVO.  Presentation is c/f small acute ischemic stroke.  Recommendations   - Assessment of any facial trauma per ED - Admit for stroke workup - Permissive HTN x48 hrs from sx onset goal BP <220/110. PRN labetalol or hydralazine if BP above these parameters. Avoid oral antihypertensives. - Patient has ICD that is not MRI compatible per EDP. Recommend instead repeat head CT at 24 hrs to eval for any evolving ischemia - CTA H&N or carotid US depending on creatinine - TTE w/ bubble - Check A1c and LDL + add statin per guidelines - UDS - Continue xarelto, if he does have an ischemic infarct based on exam it would be small and have decreased risk of hemorrhagic conversion - q4 hr neuro checks - STAT head CT for any change in neuro exam - Tele - PT/OT/SLP - Stroke education - Amb referral to neurology upon discharge   ______________________________________________________________________   Thank you for the opportunity to take part in the care of this patient. If you have any further questions, please contact the neurology consultation attending.  Signed,  Bing Neighbors, MD [facility] [phone]  If 7pm- 7am, please page neurology on call as listed in AMION.  **Any copied and pasted  documentation in this note was written by me in another application not billed for and pasted by me into this document.

## 2022-12-17 NOTE — ED Notes (Signed)
This RN spoke with Sundil MD about pt's oral medications and trouble with swallow eval.  Sundil MD updating orders.

## 2022-12-17 NOTE — ED Triage Notes (Signed)
Patient arrives by EMS from home with c/o L Arm Weakness, BLE Weakness and slurred speech.   Patient LKW at 0730 and reports had a fall at 0800.  Denies injury.

## 2022-12-17 NOTE — Progress Notes (Signed)
Orthopedic Tech Progress Note Patient Details:  Cody Ortega 10/28/1960 [id number]  Level II trauma, ortho tech not needed at this time.  Patient ID: Cody Ortega, male   DOB: 10/30/1960, 62 y.o.   MRN: [id number]  Docia Furl 12/17/2022, 7:20 PM

## 2022-12-17 NOTE — ED Provider Notes (Signed)
[place] EMERGENCY DEPARTMENT AT [facility] Provider Note   CSN: [id number] Arrival date & time: 12/17/22  1746     History  No chief complaint on file.   Cody Ortega is a 62 y.o. male.  62 yo M with a chief complaints of left-sided weakness and slurred speech.  Last known normal about 730 this morning.  Patient arrived as a code stroke and was taken urgently back to CT.  Level 5 caveat acuity of condition.        Home Medications Prior to Admission medications   Medication Sig Start Date End Date Taking? Authorizing Provider  acetaminophen (TYLENOL) 500 MG tablet Take 1,000 mg by mouth every 6 (six) hours as needed for moderate pain or headache.   Yes [provider]  albuterol (VENTOLIN HFA) 108 (90 Base) MCG/ACT inhaler INHALE 2 PUFFS INTO THE LUNGS EVERY 6 HOURS AS NEEDED FOR WHEEZING OR SHORTNESS OF BREATH 12/13/22  Yes Marcine Matar, MD  alprazolam Prudy Feeler) 2 MG tablet Take 2 mg by mouth daily as needed for anxiety.   Yes [provider]  Artificial Tear Solution (TEARS NATURALE OP) Place 1 drop into both eyes daily as needed (dry eyes).   Yes [provider]  aspirin EC 81 MG tablet Take 81 mg by mouth daily. Swallow whole.   Yes [provider]  atorvastatin (LIPITOR) 10 MG tablet TAKE 1 TABLET(10 MG) BY MOUTH DAILY 11/21/22  Yes Tonny Bollman, MD  carvedilol (COREG) 12.5 MG tablet TAKE 1 AND 1/2 TABLETS BY MOUTH TWICE DAILY 12/13/22  Yes Marcine Matar, MD  dapagliflozin propanediol (FARXIGA) 10 MG TABS tablet Take 1 tablet (10 mg total) by mouth daily before breakfast. 04/14/22  Yes Tonny Bollman, MD  entecavir (BARACLUDE) 0.5 MG tablet Take 1 tablet (0.5 mg total) by mouth every other day. 08/16/22  Yes Armbruster, Willaim Rayas, MD  ENTRESTO 49-51 MG TAKE 1 TABLET BY MOUTH TWICE DAILY 02/15/22  Yes Tonny Bollman, MD  ezetimibe (ZETIA) 10 MG tablet TAKE 1 TABLET(10 MG) BY MOUTH DAILY 08/29/22  Yes Tonny Bollman,  MD  furosemide (LASIX) 40 MG tablet TAKE 1/2 TABLET(20 MG) BY MOUTH DAILY 12/29/21  Yes Tonny Bollman, MD  hydrALAZINE (APRESOLINE) 100 MG tablet TAKE 1 TABLET(100 MG) BY MOUTH IN THE MORNING AND AT BEDTIME 04/26/22  Yes Nahser, Deloris Ping, MD  ondansetron (ZOFRAN-ODT) 8 MG disintegrating tablet Take 8 mg by mouth daily. 12/11/19  Yes [provider]  Rectal Protectant-Emollient (CALMOL-4) 76-10 % SUPP Use as directed once to twice daily 04/08/22  Yes Armbruster, Willaim Rayas, MD  RESTASIS 0.05 % ophthalmic emulsion Place 1 drop into both eyes in the morning. 11/25/21  Yes [provider]  sildenafil (REVATIO) 20 MG tablet Take 1 tablet (20 mg total) by mouth as needed. 12/03/21  Yes Tonny Bollman, MD  SYMBICORT 160-4.5 MCG/ACT inhaler INHALE 2 PUFFS INTO THE LUNGS TWICE DAILY 11/10/21  Yes Marcine Matar, MD  tamsulosin (FLOMAX) 0.4 MG CAPS capsule TAKE 1 CAPSULE(0.4 MG) BY MOUTH DAILY 10/25/22  Yes Marcine Matar, MD  XARELTO 20 MG TABS tablet TAKE 1 TABLET(20 MG) BY MOUTH DAILY WITH SUPPER 09/28/22  Yes Camnitz, Will Daphine Deutscher, MD  amiodarone (PACERONE) 200 MG tablet Take 1 tablet (200 mg total) by mouth daily. Patient not taking: Reported on 12/17/2022 12/09/21   Regan Lemming, MD  DULoxetine (CYMBALTA) 30 MG capsule Take 90 mg by mouth daily with breakfast. Patient not taking: Reported on  12/17/2022    [provider]  fluticasone (FLONASE) 50 MCG/ACT nasal spray SHAKE LIQUID AND USE 1 SPRAY IN EACH NOSTRIL DAILY Patient not taking: Reported on 12/17/2022 12/13/22   Marcine Matar, MD  polyethylene glycol (MIRALAX) 17 g packet Take 17 g by mouth 2 (two) times daily. Take as directed, Daily, titrate as needed Patient not taking: Reported on 12/17/2022 04/08/22   Benancio Deeds, MD  zaleplon (SONATA) 10 MG capsule Take 10 mg by mouth at bedtime. Patient not taking: Reported on 12/17/2022 09/20/22   [provider]      Allergies    Prochlorperazine,  Codeine, and Hydrocodone-acetaminophen    Review of Systems   Review of Systems  Physical Exam Updated Vital Signs BP (!) 193/130   Pulse 77   Temp 98.1 F (36.7 C) (Oral)   Resp 15   Wt 114.4 kg   SpO2 96%   BMI 31.52 kg/m  Physical Exam Vitals and nursing note reviewed.  Constitutional:      Appearance: He is well-developed.  HENT:     Head: Normocephalic and atraumatic.  Eyes:     Pupils: Pupils are equal, round, and reactive to light.  Neck:     Vascular: No JVD.  Cardiovascular:     Rate and Rhythm: Normal rate and regular rhythm.     Heart sounds: No murmur heard.    No friction rub. No gallop.  Pulmonary:     Effort: No respiratory distress.     Breath sounds: No wheezing.  Abdominal:     General: There is no distension.     Tenderness: There is no abdominal tenderness. There is no guarding or rebound.  Musculoskeletal:        General: Normal range of motion.     Cervical back: Normal range of motion and neck supple.  Skin:    Coloration: Skin is not pale.     Findings: No rash.  Neurological:     Mental Status: He is alert and oriented to person, place, and time.  Psychiatric:        Behavior: Behavior normal.     ED Results / Procedures / Treatments   Labs (all labs ordered are listed, but only abnormal results are displayed) Labs Reviewed  COMPREHENSIVE METABOLIC PANEL - Abnormal; Notable for the following components:      Result Value   CO2 20 (*)    Glucose, Bld 129 (*)    Creatinine, Ser 1.92 (*)    AST 14 (*)    GFR, Estimated 39 (*)    All other components within normal limits  I-STAT CHEM 8, ED - Abnormal; Notable for the following components:   BUN 30 (*)    Creatinine, Ser 1.90 (*)    Glucose, Bld 123 (*)    Calcium, Ion 1.00 (*)    Hemoglobin 17.7 (*)    All other components within normal limits  CBG MONITORING, ED - Abnormal; Notable for the following components:   Glucose-Capillary 113 (*)    All other components within normal  limits  PROTIME-INR  APTT  CBC  DIFFERENTIAL  ETHANOL  LIPID PANEL  HEMOGLOBIN A1C  HIV ANTIBODY (ROUTINE TESTING W REFLEX)  I-STAT CG4 LACTIC ACID, ED    EKG EKG Interpretation Date/Time:  Saturday December 17 2022 18:25:50 EDT Ventricular Rate:  80 PR Interval:  178 QRS Duration:  132 QT Interval:  415 QTC Calculation: 479 R Axis:   -16  Text Interpretation: Sinus  arrhythmia Multiple premature complexes, vent & supraven Left bundle branch block No significant change since last tracing Confirmed by Melene Plan [phone]) on 12/17/2022 6:37:28 PM  Radiology CT CERVICAL SPINE WO CONTRAST  Result Date: 12/17/2022 CLINICAL DATA:  Pain EXAM: CT CERVICAL SPINE WITHOUT CONTRAST TECHNIQUE: Multidetector CT imaging of the cervical spine was performed without intravenous contrast. Multiplanar CT image reconstructions were also generated. RADIATION DOSE REDUCTION: This exam was performed according to the departmental dose-optimization program which includes automated exposure control, adjustment of the mA and/or kV according to patient size and/or use of iterative reconstruction technique. COMPARISON:  Cervical spine CT 09/19/2022 FINDINGS: Alignment: Normal. Skull base and vertebrae: No acute fracture. No primary bone lesion or focal pathologic process. Soft tissues and spinal canal: No prevertebral fluid or swelling. No visible canal hematoma. Disc levels: There is mild disc space narrowing and endplate osteophyte formation at C5-C6 and C6-C7. No significant central canal or neural foraminal stenosis at any level. Upper chest: Negative. Other: There are atherosclerotic calcifications of the bilateral carotid artery bifurcations. IMPRESSION: No acute fracture or traumatic subluxation of the cervical spine. Electronically Signed   By: Darliss Cheney M.D.   On: 12/17/2022 18:17   CT HEAD CODE STROKE WO CONTRAST  Result Date: 12/17/2022 CLINICAL DATA:  Code stroke. Neuro deficit, acute, stroke suspected.  EXAM: CT HEAD WITHOUT CONTRAST TECHNIQUE: Contiguous axial images were obtained from the base of the skull through the vertex without intravenous contrast. RADIATION DOSE REDUCTION: This exam was performed according to the departmental dose-optimization program which includes automated exposure control, adjustment of the mA and/or kV according to patient size and/or use of iterative reconstruction technique. COMPARISON:  Head CT 09/19/2022. FINDINGS: Brain: No acute hemorrhage. 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 No new loss of gray-white differentiation. No hydrocephalus or extra-axial collection. No mass effect or midline shift. Vascular: No hyperdense vessel or unexpected calcification. Skull: No calvarial fracture or suspicious bone lesion. Skull base is unremarkable. Sinuses/Orbits: No acute finding. Other: None. ASPECTS [facility] Stroke Program Early CT Score) - Ganglionic level infarction (caudate, lentiform nuclei, internal capsule, insula, M1-M3 cortex): 7 - Supraganglionic infarction (M4-M6 cortex): 3 Total score (0-10 with 10 being normal): 10 IMPRESSION: 1. No acute intracranial hemorrhage or evidence of acute large vessel territory infarct. ASPECT score is 10. 2. 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 Code stroke imaging results were communicated on 12/17/2022 at 6:06 pm to provider Dr. Selina Cooley via secure text paging. Electronically Signed   By: Orvan Falconer M.D.   On: 12/17/2022 18:06    Procedures Procedures    Medications Ordered in ED Medications  sodium chloride flush (NS) 0.9 % injection 3 mL (has no administration in time range)   stroke: early stages of recovery book (has no administration in time range)  aspirin EC tablet 81 mg (has no administration in time range)  entecavir (BARACLUDE) tablet 0.5 mg (has no administration in time range)  ezetimibe (ZETIA)  tablet 10 mg (has no administration in time range)  atorvastatin (LIPITOR) tablet 10 mg (has no administration in time range)  DULoxetine (CYMBALTA) DR capsule 90 mg (has no administration in time range)  ALPRAZolam (XANAX) tablet 2 mg (has no administration in time range)  tamsulosin (FLOMAX) capsule 0.4 mg (has no administration in time range)  rivaroxaban (XARELTO) tablet 20 mg (has no administration in time range)  mometasone-formoterol (DULERA) 200-5  MCG/ACT inhaler 2 puff (has no administration in time range)  acetaminophen (TYLENOL) tablet 650 mg (has no administration in time range)    Or  acetaminophen (TYLENOL) suppository 650 mg (has no administration in time range)  ondansetron (ZOFRAN) tablet 4 mg (has no administration in time range)    Or  ondansetron (ZOFRAN) injection 4 mg (has no administration in time range)  melatonin tablet 10 mg (has no administration in time range)    ED Course/ Medical Decision Making/ A&P                                 Medical Decision Making Amount and/or Complexity of Data Reviewed Labs: ordered. Radiology: ordered.  Risk Decision regarding hospitalization.   62 yo M with a chief complaints of slurred speech and left-sided weakness.  His airway was clear to the bridge.  The patient arrived as a code stroke was taken urgently back to the CT.  I was notified by nursing that the patient had fallen earlier today.  I was asked to come rapidly evaluate to see if you would benefit from other CT imaging.  Patient has no complaints of pain.  Denies head pain denies C-spine pain denies chest pain denies abdominal pain denies back pain.  CT scan of the head and C-spine are negative for obvious intracranial hemorrhage or acute C-spine fracture.  Blood work is largely unremarkable, no significant electrolyte abnormality lactate is normal.  Patient's renal function is actually slightly improved from baseline.  I discussed case with Dr. Selina Cooley, neurology  recommended medical admission.  The patients results and plan were reviewed and discussed.   Any x-rays performed were independently reviewed by myself.   Differential diagnosis were considered with the presenting HPI.  Medications  sodium chloride flush (NS) 0.9 % injection 3 mL (has no administration in time range)   stroke: early stages of recovery book (has no administration in time range)  aspirin EC tablet 81 mg (has no administration in time range)  entecavir (BARACLUDE) tablet 0.5 mg (has no administration in time range)  ezetimibe (ZETIA) tablet 10 mg (has no administration in time range)  atorvastatin (LIPITOR) tablet 10 mg (has no administration in time range)  DULoxetine (CYMBALTA) DR capsule 90 mg (has no administration in time range)  ALPRAZolam (XANAX) tablet 2 mg (has no administration in time range)  tamsulosin (FLOMAX) capsule 0.4 mg (has no administration in time range)  rivaroxaban (XARELTO) tablet 20 mg (has no administration in time range)  mometasone-formoterol (DULERA) 200-5 MCG/ACT inhaler 2 puff (has no administration in time range)  acetaminophen (TYLENOL) tablet 650 mg (has no administration in time range)    Or  acetaminophen (TYLENOL) suppository 650 mg (has no administration in time range)  ondansetron (ZOFRAN) tablet 4 mg (has no administration in time range)    Or  ondansetron (ZOFRAN) injection 4 mg (has no administration in time range)  melatonin tablet 10 mg (has no administration in time range)    Vitals:   12/17/22 1754 12/17/22 1824 12/17/22 1900 12/17/22 1915  BP:  (!) 153/105 (!) 189/113 (!) 193/130  Pulse:   78 77  Resp:  16 20 15   Temp:  98.1 F (36.7 C)    TempSrc:  Oral    SpO2:  98% 93% 96%  Weight: 114.4 kg       Final diagnoses:  Left-sided weakness    Admission/ observation were discussed with  the admitting physician, patient and/or family and they are comfortable with the plan.          Final Clinical Impression(s)  / ED Diagnoses Final diagnoses:  Left-sided weakness    Rx / DC Orders ED Discharge Orders     None         Melene Plan, DO 12/17/22 2230

## 2022-12-18 ENCOUNTER — Inpatient Hospital Stay (HOSPITAL_COMMUNITY): Payer: Medicaid Other

## 2022-12-18 ENCOUNTER — Other Ambulatory Visit (HOSPITAL_COMMUNITY): Payer: Medicaid Other

## 2022-12-18 ENCOUNTER — Observation Stay (HOSPITAL_COMMUNITY): Payer: Medicaid Other

## 2022-12-18 DIAGNOSIS — I6389 Other cerebral infarction: Secondary | ICD-10-CM | POA: Diagnosis not present

## 2022-12-18 DIAGNOSIS — W19XXXA Unspecified fall, initial encounter: Secondary | ICD-10-CM | POA: Diagnosis present

## 2022-12-18 DIAGNOSIS — R531 Weakness: Secondary | ICD-10-CM | POA: Diagnosis not present

## 2022-12-18 DIAGNOSIS — I5022 Chronic systolic (congestive) heart failure: Secondary | ICD-10-CM

## 2022-12-18 DIAGNOSIS — G9389 Other specified disorders of brain: Secondary | ICD-10-CM | POA: Diagnosis not present

## 2022-12-18 DIAGNOSIS — J9601 Acute respiratory failure with hypoxia: Secondary | ICD-10-CM | POA: Diagnosis not present

## 2022-12-18 DIAGNOSIS — Z95810 Presence of automatic (implantable) cardiac defibrillator: Secondary | ICD-10-CM | POA: Diagnosis not present

## 2022-12-18 DIAGNOSIS — E669 Obesity, unspecified: Secondary | ICD-10-CM

## 2022-12-18 DIAGNOSIS — B1910 Unspecified viral hepatitis B without hepatic coma: Secondary | ICD-10-CM

## 2022-12-18 DIAGNOSIS — J4489 Other specified chronic obstructive pulmonary disease: Secondary | ICD-10-CM | POA: Diagnosis not present

## 2022-12-18 DIAGNOSIS — E873 Alkalosis: Secondary | ICD-10-CM | POA: Diagnosis not present

## 2022-12-18 DIAGNOSIS — I4819 Other persistent atrial fibrillation: Secondary | ICD-10-CM

## 2022-12-18 DIAGNOSIS — I429 Cardiomyopathy, unspecified: Secondary | ICD-10-CM | POA: Diagnosis not present

## 2022-12-18 DIAGNOSIS — Y838 Other surgical procedures as the cause of abnormal reaction of the patient, or of later complication, without mention of misadventure at the time of the procedure: Secondary | ICD-10-CM | POA: Diagnosis not present

## 2022-12-18 DIAGNOSIS — I10 Essential (primary) hypertension: Secondary | ICD-10-CM | POA: Diagnosis not present

## 2022-12-18 DIAGNOSIS — J690 Pneumonitis due to inhalation of food and vomit: Secondary | ICD-10-CM | POA: Diagnosis not present

## 2022-12-18 DIAGNOSIS — I651 Occlusion and stenosis of basilar artery: Secondary | ICD-10-CM | POA: Diagnosis not present

## 2022-12-18 DIAGNOSIS — E1151 Type 2 diabetes mellitus with diabetic peripheral angiopathy without gangrene: Secondary | ICD-10-CM | POA: Diagnosis not present

## 2022-12-18 DIAGNOSIS — K7460 Unspecified cirrhosis of liver: Secondary | ICD-10-CM

## 2022-12-18 DIAGNOSIS — Z7189 Other specified counseling: Secondary | ICD-10-CM | POA: Diagnosis not present

## 2022-12-18 DIAGNOSIS — I4811 Longstanding persistent atrial fibrillation: Secondary | ICD-10-CM | POA: Diagnosis not present

## 2022-12-18 DIAGNOSIS — T80818A Extravasation of other vesicant agent, initial encounter: Secondary | ICD-10-CM | POA: Diagnosis not present

## 2022-12-18 DIAGNOSIS — J9600 Acute respiratory failure, unspecified whether with hypoxia or hypercapnia: Secondary | ICD-10-CM | POA: Diagnosis not present

## 2022-12-18 DIAGNOSIS — E1122 Type 2 diabetes mellitus with diabetic chronic kidney disease: Secondary | ICD-10-CM | POA: Diagnosis not present

## 2022-12-18 DIAGNOSIS — I639 Cerebral infarction, unspecified: Secondary | ICD-10-CM | POA: Diagnosis present

## 2022-12-18 DIAGNOSIS — Z66 Do not resuscitate: Secondary | ICD-10-CM | POA: Diagnosis not present

## 2022-12-18 DIAGNOSIS — I609 Nontraumatic subarachnoid hemorrhage, unspecified: Secondary | ICD-10-CM | POA: Diagnosis not present

## 2022-12-18 DIAGNOSIS — N1832 Chronic kidney disease, stage 3b: Secondary | ICD-10-CM | POA: Diagnosis not present

## 2022-12-18 DIAGNOSIS — I6322 Cerebral infarction due to unspecified occlusion or stenosis of basilar arteries: Secondary | ICD-10-CM | POA: Diagnosis not present

## 2022-12-18 DIAGNOSIS — Z6831 Body mass index (BMI) 31.0-31.9, adult: Secondary | ICD-10-CM | POA: Diagnosis not present

## 2022-12-18 DIAGNOSIS — I6340 Cerebral infarction due to embolism of unspecified cerebral artery: Secondary | ICD-10-CM

## 2022-12-18 DIAGNOSIS — G9349 Other encephalopathy: Secondary | ICD-10-CM | POA: Diagnosis not present

## 2022-12-18 DIAGNOSIS — M542 Cervicalgia: Secondary | ICD-10-CM | POA: Diagnosis not present

## 2022-12-18 DIAGNOSIS — N179 Acute kidney failure, unspecified: Secondary | ICD-10-CM | POA: Diagnosis not present

## 2022-12-18 DIAGNOSIS — E440 Moderate protein-calorie malnutrition: Secondary | ICD-10-CM | POA: Diagnosis not present

## 2022-12-18 DIAGNOSIS — R402A Nontraumatic coma due to underlying condition: Secondary | ICD-10-CM | POA: Diagnosis not present

## 2022-12-18 DIAGNOSIS — R471 Dysarthria and anarthria: Secondary | ICD-10-CM

## 2022-12-18 DIAGNOSIS — R29818 Other symptoms and signs involving the nervous system: Secondary | ICD-10-CM | POA: Diagnosis not present

## 2022-12-18 DIAGNOSIS — Y92009 Unspecified place in unspecified non-institutional (private) residence as the place of occurrence of the external cause: Secondary | ICD-10-CM | POA: Diagnosis not present

## 2022-12-18 DIAGNOSIS — D696 Thrombocytopenia, unspecified: Secondary | ICD-10-CM | POA: Diagnosis not present

## 2022-12-18 DIAGNOSIS — I6312 Cerebral infarction due to embolism of basilar artery: Secondary | ICD-10-CM | POA: Diagnosis not present

## 2022-12-18 DIAGNOSIS — G4733 Obstructive sleep apnea (adult) (pediatric): Secondary | ICD-10-CM

## 2022-12-18 DIAGNOSIS — Z515 Encounter for palliative care: Secondary | ICD-10-CM | POA: Diagnosis not present

## 2022-12-18 DIAGNOSIS — I2510 Atherosclerotic heart disease of native coronary artery without angina pectoris: Secondary | ICD-10-CM | POA: Diagnosis not present

## 2022-12-18 DIAGNOSIS — G8194 Hemiplegia, unspecified affecting left nondominant side: Secondary | ICD-10-CM | POA: Diagnosis not present

## 2022-12-18 DIAGNOSIS — I6523 Occlusion and stenosis of bilateral carotid arteries: Secondary | ICD-10-CM | POA: Diagnosis not present

## 2022-12-18 DIAGNOSIS — I130 Hypertensive heart and chronic kidney disease with heart failure and stage 1 through stage 4 chronic kidney disease, or unspecified chronic kidney disease: Secondary | ICD-10-CM | POA: Diagnosis not present

## 2022-12-18 LAB — RENAL FUNCTION PANEL
Albumin: 3.7 g/dL (ref 3.5–5.0)
Anion gap: 11 (ref 5–15)
BUN: 18 mg/dL (ref 6–20)
CO2: 21 mmol/L — ABNORMAL LOW (ref 22–32)
Calcium: 9.2 mg/dL (ref 8.9–10.3)
Chloride: 109 mmol/L (ref 98–111)
Creatinine, Ser: 1.79 mg/dL — ABNORMAL HIGH (ref 0.61–1.24)
GFR, Estimated: 43 mL/min — ABNORMAL LOW (ref >60)
Glucose, Bld: 122 mg/dL — ABNORMAL HIGH (ref 70–99)
Phosphorus: 2.8 mg/dL (ref 2.5–4.6)
Potassium: 3.1 mmol/L — ABNORMAL LOW (ref 3.5–5.1)
Sodium: 141 mmol/L (ref 135–145)

## 2022-12-18 LAB — APTT: aPTT: 38 s — ABNORMAL HIGH (ref 24–36)

## 2022-12-18 LAB — RAPID URINE DRUG SCREEN, HOSP PERFORMED
Amphetamines: NOT DETECTED
Barbiturates: NOT DETECTED
Benzodiazepines: POSITIVE — AB
Cocaine: NOT DETECTED
Opiates: NOT DETECTED
Tetrahydrocannabinol: POSITIVE — AB

## 2022-12-18 LAB — LIPID PANEL
Cholesterol: 136 mg/dL (ref 0–200)
HDL: 36 mg/dL — ABNORMAL LOW (ref >40)
LDL Cholesterol: 88 mg/dL (ref 0–99)
Total CHOL/HDL Ratio: 3.8 ratio
Triglycerides: 59 mg/dL (ref <150)
VLDL: 12 mg/dL (ref 0–40)

## 2022-12-18 LAB — HEMOGLOBIN A1C
Hgb A1c MFr Bld: 5.5 % (ref 4.8–5.6)
Mean Plasma Glucose: 111.15 mg/dL

## 2022-12-18 LAB — HIV ANTIBODY (ROUTINE TESTING W REFLEX): HIV Screen 4th Generation wRfx: NONREACTIVE

## 2022-12-18 LAB — HEPARIN LEVEL (UNFRACTIONATED)
Heparin Unfractionated: 0.12 [IU]/mL — ABNORMAL LOW (ref 0.30–0.70)
Heparin Unfractionated: 0.35 [IU]/mL (ref 0.30–0.70)

## 2022-12-18 LAB — MAGNESIUM: Magnesium: 2 mg/dL (ref 1.7–2.4)

## 2022-12-18 MED ORDER — HYDRALAZINE HCL 20 MG/ML IJ SOLN: 10.0000 mg | Freq: Four times a day (QID) | INTRAMUSCULAR | Status: DC | PRN

## 2022-12-18 MED ORDER — ATORVASTATIN CALCIUM 10 MG PO TABS
20.0000 mg | ORAL_TABLET | Freq: Every day | ORAL | Status: DC
  Filled 2022-12-18 (×2): qty 2

## 2022-12-18 MED ORDER — HYDRALAZINE HCL 100 MG PO TABS: 100.0000 mg | ORAL_TABLET | Freq: Three times a day (TID) | ORAL | Status: DC

## 2022-12-18 MED ORDER — LABETALOL HCL 5 MG/ML IV SOLN
20.0000 mg | INTRAVENOUS | Status: DC | PRN
  Administered 2022-12-18 (×2): 20 mg via INTRAVENOUS

## 2022-12-18 MED ORDER — LABETALOL HCL 5 MG/ML IV SOLN: 0.5000 mg/min | Status: DC

## 2022-12-18 MED ORDER — HYDRALAZINE HCL 20 MG/ML IJ SOLN
20.0000 mg | Freq: Four times a day (QID) | INTRAMUSCULAR | Status: DC | PRN
  Administered 2022-12-18 – 2022-12-19 (×2): 20 mg via INTRAVENOUS
  Filled 2022-12-18 (×2): qty 1

## 2022-12-18 MED ORDER — POTASSIUM CHLORIDE IN NACL 40-0.9 MEQ/L-% IV SOLN
INTRAVENOUS | Status: DC
  Filled 2022-12-18 (×2): qty 1000

## 2022-12-18 MED ORDER — LABETALOL HCL 5 MG/ML IV SOLN
20.0000 mg | INTRAVENOUS | Status: DC | PRN
  Administered 2022-12-18 (×2): 20 mg via INTRAVENOUS
  Filled 2022-12-18 (×4): qty 4

## 2022-12-18 MED ORDER — POTASSIUM CHLORIDE 10 MEQ/100ML IV SOLN
10.0000 meq | INTRAVENOUS | Status: AC
  Administered 2022-12-18 (×3): 10 meq via INTRAVENOUS
  Filled 2022-12-18: qty 100

## 2022-12-18 MED ORDER — CARVEDILOL 12.5 MG PO TABS: 12.5000 mg | ORAL_TABLET | Freq: Two times a day (BID) | ORAL | Status: DC

## 2022-12-18 MED ORDER — LABETALOL HCL 5 MG/ML IV SOLN
20.0000 mg | INTRAVENOUS | Status: DC | PRN
  Administered 2022-12-18 (×2): 20 mg via INTRAVENOUS
  Filled 2022-12-18 (×2): qty 4

## 2022-12-18 NOTE — Progress Notes (Signed)
ANTICOAGULATION CONSULT NOTE  Pharmacy Consult for Xarelto > heparin Indication: atrial fibrillation  Allergies  Allergen Reactions   Prochlorperazine Other (See Comments)    Medication caused a lot of issues like blurred vision, nausea, pain, and carpool turnel   Codeine Itching, Nausea And Vomiting and Hives   Hydrocodone-Acetaminophen Itching and Nausea And Vomiting    Patient Measurements: Height: 6\' 3"  (190.5 cm) Weight: 114.4 kg (252 lb 3.3 oz) IBW/kg (Calculated) : 84.5 Heparin Dosing Weight: 108.3 kg   Vital Signs: Temp: 98.1 F (36.7 C) (08/18 1359) Temp Source: Oral (08/18 0913) BP: 182/114 (08/18 1600) Pulse Rate: 69 (08/18 1600)  Labs: Recent Labs    12/17/22 1748 12/17/22 1753 12/18/22 0730  HGB 16.9 17.7*  --   HCT 50.5 52.0  --   PLT 170  --   --   APTT 26  --  38*  LABPROT 13.6  --   --   INR 1.0  --   --   HEPARINUNFRC  --   --  0.12*  CREATININE 1.92* 1.90* 1.79*    Estimated Creatinine Clearance: 59.9 mL/min (A) (by C-G formula based on SCr of 1.79 mg/dL (H)).   Medical History: Past Medical History:  Diagnosis Date   Anxiety    Arthritis    Benzodiazepine dependence (HCC)    Bronchial asthma    CAD (coronary artery disease)    a. Cath 03/2010: mod RCA stenosis, severe diag stenosis, treated medically given lack of angina.   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 Episode in 2008 with spontaneous conversion, on amiodarone per EP.   S/P implantation of automatic  cardioverter/defibrillator (AICD)    a. [employer], implanted 2012.   Sleep apnea    Stroke [facility]) 2004   Thyroid disease     Medications:  Scheduled:   aspirin EC  81 mg Oral Daily   atorvastatin  20 mg Oral Daily   entecavir  0.5 mg Oral QODAY   ezetimibe  10 mg Oral Daily   mometasone-formoterol  2 puff Inhalation BID   tamsulosin  0.4 mg Oral Daily    Assessment: 62 yom presenting with weakness/dysarthria, CT showing no acute infarcts but old infarct - on Xarelto PTA for hx Afib (LD 8/16 PM). Did not pass bedside swallow exam so will bridge with heparin for now.    0.35 heparin level, therapeutic  No issues with infusion or overt s/sx bleeding.  Goal of Therapy:  Heparin level 0.3-0.5 units/ml aPTT 66-85 seconds Monitor platelets by anticoagulation protocol: Yes   Plan:  Continue heparin infusion to 1700 units/hr  Monitor daily HL, CBC, and s/sx of bleeding  F/u plans for transition to oral anticoagulation   Thank you for allowing pharmacy to participate in this patient's care.  Marja Kays, PharmD Emergency Medicine Clinical Pharmacist 12/18/2022,4:03 PM

## 2022-12-18 NOTE — ED Notes (Signed)
Pt requesting PRN Xanax but currently NPO due to failed swallow eval.  This RN notified Chen DO of pt's request.

## 2022-12-18 NOTE — Progress Notes (Addendum)
Bedside nurse reported that patient blood pressure is persistently elevated to upper 200 and have to give multiple doses of labetalol.  Patient has been admitted for acute CVA and per neurology note we are doing permissive hypertension for 24-hour which has been completed.  -Per chart review speech and swallow evaluated patient and he did not pass swallow screen recommended multidisciplinary involvement - Starting hydralazine 20 mg every 6 hour as needed for systolic blood pressure more than 160 and diastolic blood pressure more than 110. -Per chart review at home patient is on  hydralazine 100 mg 3 times daily and carvedilol 12.5 mg twice daily.  Due to failed bedside swallow evaluation by speech pathologist cannot give any oral medication for now. -Given patient heart rate is around 70 discontinuing labetalol.    Tereasa Coop, MD [facility] 12/18/2022, 11:11 PM

## 2022-12-18 NOTE — Progress Notes (Signed)
VASCULAR LAB    Carotid duplex has been performed.  See CV proc for preliminary results.   Alick Lecomte, RVT 12/18/2022, 11:26 AM

## 2022-12-18 NOTE — Evaluation (Signed)
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) 12/17/2022   Obesity (BMI 30-39.9) 12/17/2022   Class 2 severe obesity with serious comorbidity and body mass index (BMI) of 35.0 to 35.9 in adult, unspecified obesity type (HCC) 08/12/2021   Vitreous hemorrhage of left eye (HCC) 08/12/2021   Chronic hypoxemic respiratory failure (HCC) 12/12/2020   Secondary hypercoagulable state (HCC) 10/14/2020   HFrEF (heart failure with reduced ejection fraction) (HCC)    Stage 3b chronic kidney disease (HCC) 09/05/2019   BPH (benign prostatic hyperplasia) 06/19/2019   Adenomatous polyp of colon 02/09/2018   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 agent with hepatic coma 02/09/2018   Osteoarthritis of both knees 06/12/2017   COPD with asthma 03/17/2016   Chronic seasonal allergic rhinitis 03/17/2016   GERD (gastroesophageal reflux disease) 03/17/2016   Chronic pain syndrome 03/16/2015   Obstructive sleep apnea 03/16/2015   Chronic low back pain with right-sided sciatica 03/16/2015   Sinus brady-tachy syndrome (HCC) 02/20/2014   Atrial fibrillation (HCC) 12/31/2013   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 Cath 03/2010: mod RCA stenosis,  severe diag stenosis, treated medically given lack of angina.   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 Episode in 2008 with spontaneous conversion, on amiodarone per EP.   S/P implantation of automatic cardioverter/defibrillator (AICD)    a. [employer], implanted 2012.   Sleep apnea    Stroke [facility]) 2004   Thyroid disease    Past Surgical History:  Past Surgical History:  Procedure Laterality Date   CARDIOVERSION N/A 01/07/2014   Procedure: CARDIOVERSION;  Surgeon: Lewayne Bunting, MD;  Location: [facility] ENDOSCOPY;  Service: Cardiovascular;  Laterality: N/A;   CARDIOVERSION N/A 02/11/2014   Procedure: CARDIOVERSION;  Surgeon: Chrystie Nose, MD;  Location: [facility] ENDOSCOPY;  Service: Cardiovascular;  Laterality: N/A;   CARDIOVERSION N/A 10/01/2020   Procedure: CARDIOVERSION;  Surgeon: Christell Constant, MD;  Location: [facility] ENDOSCOPY;  Service: Cardiovascular;  Laterality: N/A;   CATARACT EXTRACTION     COLONOSCOPY WITH PROPOFOL N/A 02/06/2018   Procedure: COLONOSCOPY WITH PROPOFOL;  Surgeon: Benancio Deeds, MD;  Location: [facility] ENDOSCOPY;  Service: Gastroenterology;  Laterality: N/A;   EYE SURGERY  ICD GENERATOR CHANGEOUT N/A 01/17/2020   Procedure: ICD GENERATOR CHANGEOUT;  Surgeon: Marinus Maw, MD;  Location: [facility] INVASIVE CV LAB;  Service: Cardiovascular;  Laterality: N/A;   LASEK eye surgery Bilateral 05/2018   LASIK Bilateral    PACEMAKER INSERTION     POLYPECTOMY  02/06/2018   Procedure: POLYPECTOMY;  Surgeon:  Benancio Deeds, MD;  Location: [facility] ENDOSCOPY;  Service: Gastroenterology;;   TEE WITHOUT CARDIOVERSION N/A 01/07/2014   Procedure: TRANSESOPHAGEAL ECHOCARDIOGRAM (TEE);  Surgeon: Lewayne Bunting, MD;  Location: [facility] ENDOSCOPY;  Service: Cardiovascular;  Laterality: N/A;   TEE WITHOUT CARDIOVERSION N/A 02/11/2014   Procedure: TRANSESOPHAGEAL ECHOCARDIOGRAM (TEE);  Surgeon: Chrystie Nose, MD;  Location: [facility] ENDOSCOPY;  Service: Cardiovascular;  Laterality: N/A;   TEE WITHOUT CARDIOVERSION N/A 10/01/2020   Procedure: TRANSESOPHAGEAL ECHOCARDIOGRAM (TEE);  Surgeon: Christell Constant, MD;  Location: [facility] ENDOSCOPY;  Service: Cardiovascular;  Laterality: N/A;   TONSILLECTOMY     TRANSTHORACIC ECHOCARDIOGRAM  10/2006, 9/20169   HPI:  62 year old male history of obesity BMI 31.5, hypertension, coronary artery disease, history of 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  Patient's girlfriend states that he got caught up between the bed and the wall and fell down.  He refused EMS transport.  Several hours later he was noted to have continued weakness and bilateral upper and lower extremities.  He also had dysarthria.  Patient finally agreed to come to the hospital. CT clear, not MARI compatible. hx of CVA.   Assessment / Plan / Recommendation Clinical Impression  Pt demonstrates a primary expressive/motor speech disorder in the absence of focal lingual or labial weakness. Pt is inconsistent in his speech production, sometimes is aphonic when attempting to communicate and with spontaneous language attempts can bee completely inaccurate and unintelligible at times. However, when given a structured speech task like saying his name or coughing pt is more succsessful and can be accurate with a model and cues. Auditory  comprehension is very good; pt follows commands and answer Y/N questions with 80% accuracy (had some errors when prepositions added). SLP will f/u, recommend intensive interventions.    SLP Assessment  SLP Visit Diagnosis: Apraxia (R48.2);Aphasia (R47.01)    Recommendations for follow up therapy are one component of a multi-disciplinary discharge planning process, led by the attending physician.  Recommendations may be updated based on patient status, additional functional criteria and insurance authorization.    Follow Up Recommendations  Acute inpatient rehab (3hours/day)    Assistance Recommended at Discharge     Functional Status Assessment Patient has had a recent decline in their functional status and demonstrates the ability to make significant improvements in function in a reasonable and predictable amount of time.  Frequency and Duration min 2x/week  2 weeks      SLP Evaluation Cognition  Overall Cognitive Status: Within Functional Limits for tasks assessed Arousal/Alertness: Awake/alert Orientation Level: Oriented X4 Attention: Sustained       Comprehension  Auditory Comprehension Overall Auditory Comprehension: Impaired Yes/No Questions: Impaired Basic Biographical Questions: 76-100% accurate Basic Immediate Environment Questions: 75-100% accurate Complex Questions: 50-74% accurate Commands: Within Functional Limits    Expression Verbal Expression Overall Verbal Expression: Impaired Initiation: Impaired Automatic Speech: Name;Social Response;Counting Level of Generative/Spontaneous Verbalization: Phrase;Word Repetition: No impairment Naming: Not tested   Oral / Motor  Oral Motor/Sensory  Function Overall Oral Motor/Sensory Function: Within functional limits Motor Speech Overall Motor Speech: Impaired Respiration: Within functional limits Phonation: Breathy Resonance: Within functional limits Articulation: Impaired Level of Impairment: Word Intelligibility:  Intelligibility reduced Word: 25-49% accurate Phrase: 25-49% accurate Motor Planning: Impaired Level of Impairment: Word Motor Speech Errors: Inconsistent;Groping for words            Glynda Soliday, Riley Nearing 12/18/2022, 12:03 PM

## 2022-12-18 NOTE — TOC CAGE-AID Note (Signed)
Transition of Care [facility]) - CAGE-AID Screening   Patient Details  Name: Cody Ortega MRN: [id number] Date of Birth: 1960-06-30  Transition of Care [facility]) CM/SW Contact:    Katha Hamming, RN Phone Number: 12/18/2022, 9:13 PM   Clinical Narrative:  Denies drug/alcohol use during CAGEAID, rapid urine drug screening positive for benzos and THC  CAGE-AID Screening:    Have You Ever Felt You Ought to Cut Down on Your Drinking or Drug Use?: No Have People Annoyed You By Critizing Your Drinking Or Drug Use?: No Have You Felt Bad Or Guilty About Your Drinking Or Drug Use?: No Have You Ever Had a Drink or Used Drugs First Thing In The Morning to Steady Your Nerves or to Get Rid of a Hangover?: No CAGE-AID Score: 0  Substance Abuse Education Offered: No

## 2022-12-18 NOTE — Progress Notes (Signed)
Inpatient Rehab Admissions Coordinator:   Per therapy recommendations, patient was screened for CIR candidacy by Megan Salon, MS, CCC-SLP. At this time, Pt. is pt. Has not yet attempted OOB (unsafe due to HTN during PT eval) so it  is not clear if he can tolerate the intensity of CIR at  this time; however,  Pt. may have potential to progress to becoming a potential CIR candidate, so CIR admissions team will follow and monitor for progress and participation with therapies and place consult order if Pt. appears to be an appropriate candidate. Please contact me with any questions.

## 2022-12-18 NOTE — Plan of Care (Signed)

## 2022-12-18 NOTE — ED Notes (Signed)
ED TO INPATIENT HANDOFF REPORT  ED Nurse Name and Phone #: Britt Boozer 38  S Name/Age/Gender [name] 62 y.o. male Room/Bed: 009C/009C  Code Status   Code Status: Full Code  Home/SNF/Other Home Patient oriented to: self, place, time, and situation Is this baseline? Yes   Triage Complete: Triage complete  Chief Complaint CVA (cerebral vascular accident) [facility]) [I63.9]  Triage Note Patient arrives by EMS from home with c/o L Arm Weakness, BLE Weakness and slurred speech.   Patient LKW at 0730 and reports had a fall at 0800.  Denies injury.    Allergies Allergies  Allergen Reactions   Prochlorperazine Other (See Comments)    Medication caused a lot of issues like blurred vision, nausea, pain, and carpool turnel   Codeine Itching, Nausea And Vomiting and Hives   Hydrocodone-Acetaminophen Itching and Nausea And Vomiting    Level of Care/Admitting Diagnosis ED Disposition     ED Disposition  Admit   Condition  --   Comment  Hospital [facility] [[id number]]  Level of Care: Telemetry Medical [[id number]]  May place patient in observation at [facility] or [facility] if equivalent level of care is available:: No  Covid Evaluation: Asymptomatic - no recent exposure (last 10 days) testing not required  Diagnosis: CVA (cerebral vascular accident) [facility]) [[id number]]  Admitting Physician: Imogene Burn ERIC [[id number]]  Attending Physician: Imogene Burn, ERIC [[id number]]          B Medical/Surgery History Past Medical History:  Diagnosis Date   Anxiety    Arthritis    Benzodiazepine dependence (HCC)    Bronchial asthma    CAD (coronary artery disease)    a. Cath 03/2010: mod RCA stenosis, severe diag stenosis, treated medically given lack of angina.   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 Episode in 2008 with spontaneous conversion, on amiodarone per EP.   S/P implantation of automatic cardioverter/defibrillator (AICD)    a. Medtronic, implanted 2012.   Sleep apnea    Stroke [facility]) 2004   Thyroid disease    Past Surgical History:  Procedure Laterality Date   CARDIOVERSION N/A 01/07/2014   Procedure: CARDIOVERSION;  Surgeon: Lewayne Bunting, MD;  Location: [facility] ENDOSCOPY;  Service: Cardiovascular;  Laterality: N/A;   CARDIOVERSION N/A 02/11/2014   Procedure: CARDIOVERSION;  Surgeon: Chrystie Nose, MD;  Location: [facility] ENDOSCOPY;  Service: Cardiovascular;  Laterality: N/A;   CARDIOVERSION N/A 10/01/2020   Procedure: CARDIOVERSION;  Surgeon: Christell Constant, MD;  Location: [facility] ENDOSCOPY;  Service: Cardiovascular;  Laterality: N/A;   CATARACT EXTRACTION     COLONOSCOPY WITH PROPOFOL N/A 02/06/2018   Procedure: COLONOSCOPY WITH PROPOFOL;  Surgeon: Benancio Deeds, MD;  Location: [facility] ENDOSCOPY;  Service: Gastroenterology;  Laterality: N/A;   EYE SURGERY     ICD GENERATOR CHANGEOUT N/A 01/17/2020   Procedure: ICD GENERATOR CHANGEOUT;  Surgeon: Marinus Maw, MD;  Location: [facility] INVASIVE  CV LAB;  Service: Cardiovascular;  Laterality: N/A;   LASEK eye surgery Bilateral 05/2018   LASIK Bilateral    PACEMAKER INSERTION     POLYPECTOMY  02/06/2018   Procedure: POLYPECTOMY;  Surgeon: Benancio Deeds, MD;  Location: [facility] ENDOSCOPY;  Service: Gastroenterology;;   TEE WITHOUT CARDIOVERSION N/A 01/07/2014   Procedure: TRANSESOPHAGEAL ECHOCARDIOGRAM (TEE);  Surgeon: Lewayne Bunting, MD;  Location: [facility] ENDOSCOPY;  Service: Cardiovascular;  Laterality: N/A;   TEE WITHOUT CARDIOVERSION N/A  02/11/2014   Procedure: TRANSESOPHAGEAL ECHOCARDIOGRAM (TEE);  Surgeon: Chrystie Nose, MD;  Location: [facility] ENDOSCOPY;  Service: Cardiovascular;  Laterality: N/A;   TEE WITHOUT CARDIOVERSION N/A 10/01/2020   Procedure: TRANSESOPHAGEAL ECHOCARDIOGRAM (TEE);  Surgeon: Christell Constant, MD;  Location: [facility] ENDOSCOPY;  Service: Cardiovascular;  Laterality: N/A;   TONSILLECTOMY     TRANSTHORACIC ECHOCARDIOGRAM  10/2006, 12/2009     A IV Location/Drains/Wounds Patient Lines/Drains/Airways Status     Active Line/Drains/Airways     Name Placement date Placement time Site Days   Peripheral IV 12/17/22 20 G Anterior;Right Forearm 12/17/22  1807  Forearm  1   Peripheral IV 12/18/22 20 G Right;Upper Forearm 12/18/22  0102  Forearm  less than 1   External Urinary Catheter 12/18/22  1114  --  less than 1            Intake/Output Last 24 hours No intake or output data in the 24 hours ending 12/18/22 1448  Labs/Imaging Results for orders placed or performed during the hospital encounter of 12/17/22 (from the past 48 hour(s))  Protime-INR     Status: None   Collection Time: 12/17/22  5:48 PM  Result Value Ref Range   Prothrombin Time 13.6 11.4 - 15.2 seconds   INR 1.0 0.8 - 1.2    Comment: (NOTE) INR goal varies based on device and disease states. Performed at [facility], [address] [place], [place] [address]   APTT     Status: None   Collection Time: 12/17/22  5:48 PM  Result Value Ref Range   aPTT 26 24 - 36 seconds    Comment: Performed at [facility], [address], [place], [place] [address]  CBC     Status: None   Collection Time: 12/17/22  5:48 PM  Result Value Ref Range   WBC 9.4 4.0 - 10.5 K/uL   RBC 5.57 4.22 - 5.81 MIL/uL   Hemoglobin 16.9 13.0 - 17.0 g/dL   HCT 29.5 28.4 - 13.2 %   MCV 90.7 80.0 - 100.0 fL   MCH 30.3 26.0 - 34.0 pg   MCHC 33.5 30.0 - 36.0 g/dL   RDW 44.0 10.2 - 72.5 %   Platelets 170 150 - 400 K/uL   nRBC 0.0 0.0 - 0.2 %     Comment: Performed at [facility], [address] [place], [place] [address]  Differential     Status: None   Collection Time: 12/17/22  5:48 PM  Result Value Ref Range   Neutrophils Relative % 79 %   Neutro Abs 7.5 1.7 - 7.7 K/uL   Lymphocytes Relative 12 %   Lymphs Abs 1.1 0.7 - 4.0 K/uL   Monocytes Relative 6 %   Monocytes Absolute 0.6 0.1 - 1.0 K/uL   Eosinophils Relative 2 %   Eosinophils Absolute 0.1 0.0 - 0.5 K/uL   Basophils Relative 1 %   Basophils Absolute 0.1 0.0 - 0.1 K/uL   Immature Granulocytes  0 %   Abs Immature Granulocytes 0.03 0.00 - 0.07 K/uL    Comment: Performed at [facility], [address], [place], [place] [address]  Comprehensive metabolic panel     Status: Abnormal   Collection Time: 12/17/22  5:48 PM  Result Value Ref Range   Sodium 141 135 - 145 mmol/L   Potassium 3.6 3.5 - 5.1 mmol/L   Chloride 110 98 - 111 mmol/L   CO2 20 (L) 22 - 32 mmol/L   Glucose, Bld 129 (H) 70 - 99 mg/dL    Comment: Glucose reference range applies only to samples taken after fasting for at least 8 hours.   BUN 19 6 - 20 mg/dL   Creatinine, Ser 6.44 (H) 0.61 - 1.24 mg/dL   Calcium 9.4 8.9 - 03.4 mg/dL   Total Protein 6.8 6.5 - 8.1 g/dL   Albumin 3.9 3.5 - 5.0 g/dL   AST 14 (L) 15 - 41 U/L   ALT 13 0 - 44 U/L   Alkaline Phosphatase 57 38 - 126 U/L   Total Bilirubin 0.9 0.3 - 1.2 mg/dL   GFR, Estimated 39 (L) >60 mL/min    Comment: (NOTE) Calculated using the CKD-EPI Creatinine Equation (2021)    Anion gap 11 5 - 15    Comment: Performed at [facility], [address], [place], [place] [address]  Ethanol     Status: None   Collection Time: 12/17/22  5:48 PM  Result Value Ref Range   Alcohol, Ethyl (B) <10 <10 mg/dL    Comment: (NOTE) Lowest detectable limit for serum alcohol is 10 mg/dL.  For medical purposes only. Performed at [facility], [address] [place], [place] [address]   CBG monitoring, ED     Status: Abnormal    Collection Time: 12/17/22  5:51 PM  Result Value Ref Range   Glucose-Capillary 113 (H) 70 - 99 mg/dL    Comment: Glucose reference range applies only to samples taken after fasting for at least 8 hours.  I-stat chem 8, ED     Status: Abnormal   Collection Time: 12/17/22  5:53 PM  Result Value Ref Range   Sodium 142 135 - 145 mmol/L   Potassium 4.5 3.5 - 5.1 mmol/L   Chloride 111 98 - 111 mmol/L   BUN 30 (H) 6 - 20 mg/dL   Creatinine, Ser 5.64 (H) 0.61 - 1.24 mg/dL   Glucose, Bld 332 (H) 70 - 99 mg/dL    Comment: Glucose reference range applies only to samples taken after fasting for at least 8 hours.   Calcium, Ion 1.00 (L) 1.15 - 1.40 mmol/L   TCO2 22 22 - 32 mmol/L   Hemoglobin 17.7 (H) 13.0 - 17.0 g/dL   HCT 95.1 88.4 - 16.6 %  I-Stat CG4 Lactic Acid, ED     Status: None   Collection Time: 12/17/22  6:12 PM  Result Value Ref Range   Lactic Acid, Venous 1.2 0.5 - 1.9 mmol/L  Lipid panel     Status: Abnormal   Collection Time: 12/18/22  1:14 AM  Result Value Ref Range   Cholesterol 136 0 - 200 mg/dL   Triglycerides 59 <063 mg/dL   HDL 36 (L) >01 mg/dL   Total CHOL/HDL Ratio 3.8 RATIO   VLDL 12 0 - 40 mg/dL   LDL Cholesterol 88 0 - 99 mg/dL    Comment:        Total Cholesterol/HDL:CHD Risk Coronary  Heart Disease Risk Table                     Men   Women  1/2 Average Risk   3.4   3.3  Average Risk       5.0   4.4  2 X Average Risk   9.6   7.1  3 X Average Risk  23.4   11.0        Use the calculated Patient Ratio above and the CHD Risk Table to determine the patient's CHD Risk.        ATP III CLASSIFICATION (LDL):  <100     mg/dL   Optimal  409-811  mg/dL   Near or Above                    Optimal  130-159  mg/dL   Borderline  914-782  mg/dL   High  >956     mg/dL   Very High Performed at [facility], [address], [place], [place] [address]   Hemoglobin A1c     Status: None   Collection Time: 12/18/22  1:14 AM  Result Value Ref Range   Hgb A1c MFr Bld  5.5 4.8 - 5.6 %    Comment: (NOTE) Pre diabetes:          5.7%-6.4%  Diabetes:              >6.4%  Glycemic control for   <7.0% adults with diabetes    Mean Plasma Glucose 111.15 mg/dL    Comment: Performed at [facility], [address], [place], [place] [address]  HIV Antibody (routine testing w rflx)     Status: None   Collection Time: 12/18/22  1:14 AM  Result Value Ref Range   HIV Screen 4th Generation wRfx Non Reactive Non Reactive    Comment: Performed at [facility], [address], [place], [place] [address]  APTT     Status: Abnormal   Collection Time: 12/18/22  7:30 AM  Result Value Ref Range   aPTT 38 (H) 24 - 36 seconds    Comment:        IF BASELINE aPTT IS ELEVATED, SUGGEST PATIENT RISK ASSESSMENT BE USED TO DETERMINE APPROPRIATE ANTICOAGULANT THERAPY. Performed at [facility], [address], [place], [place] [address]   Heparin level (unfractionated)     Status: Abnormal   Collection Time: 12/18/22  7:30 AM  Result Value Ref Range   Heparin Unfractionated 0.12 (L) 0.30 - 0.70 IU/mL    Comment: (NOTE) The clinical reportable range upper limit is being lowered to >1.10 to align with the FDA approved guidance for the current laboratory assay.  If heparin results are below expected values, and patient dosage has  been confirmed, suggest follow up testing of antithrombin III levels. Performed at [facility], [address] [place], [place] [address]   Renal function panel     Status: Abnormal   Collection Time: 12/18/22  7:30 AM  Result Value Ref Range   Sodium 141 135 - 145 mmol/L   Potassium 3.1 (L) 3.5 - 5.1 mmol/L   Chloride 109 98 - 111 mmol/L   CO2 21 (L) 22 - 32 mmol/L   Glucose, Bld 122 (H) 70 - 99 mg/dL    Comment: Glucose reference range applies only to samples taken after fasting for at least 8 hours.   BUN 18  6 - 20 mg/dL   Creatinine, Ser 1.32 (H) 0.61 - 1.24 mg/dL   Calcium 9.2 8.9 - 44.0 mg/dL   Phosphorus  2.8 2.5 - 4.6 mg/dL   Albumin 3.7 3.5 - 5.0 g/dL   GFR, Estimated 43 (L) >60 mL/min    Comment: (NOTE) Calculated using the CKD-EPI Creatinine Equation (2021)    Anion gap 11 5 - 15    Comment: Performed at [facility], [address] [place], [place] [address]  Magnesium     Status: None   Collection Time: 12/18/22  7:30 AM  Result Value Ref Range   Magnesium 2.0 1.7 - 2.4 mg/dL    Comment: Performed at [facility], [address] [place], [place] [address]  Rapid urine drug screen (hospital performed)     Status: Abnormal   Collection Time: 12/18/22  9:08 AM  Result Value Ref Range   Opiates NONE DETECTED NONE DETECTED   Cocaine NONE DETECTED NONE DETECTED   Benzodiazepines POSITIVE (A) NONE DETECTED   Amphetamines NONE DETECTED NONE DETECTED   Tetrahydrocannabinol POSITIVE (A) NONE DETECTED   Barbiturates NONE DETECTED NONE DETECTED    Comment: (NOTE) DRUG SCREEN FOR MEDICAL PURPOSES ONLY.  IF CONFIRMATION IS NEEDED FOR ANY PURPOSE, NOTIFY LAB WITHIN 5 DAYS.  LOWEST DETECTABLE LIMITS FOR URINE DRUG SCREEN Drug Class                     Cutoff (ng/mL) Amphetamine and metabolites    1000 Barbiturate and metabolites    200 Benzodiazepine                 200 Opiates and metabolites        300 Cocaine and metabolites        300 THC                            50 Performed at [facility], [address] [place], [place] [address]    VAS US CAROTID (at [facility] and [facility] only)  Result Date: 12/18/2022 Carotid Arterial Duplex Study Patient Name:  OSAGIE DOSKOCIL  Date of Exam:   12/18/2022 Medical Rec #: [id number]         Accession #:    [id number] Date of Birth: 02-08-1961         Patient Gender: M Patient Age:   79 years Exam Location:  [facility] Procedure:      VAS US CAROTID Referring Phys: ERIC CHEN --------------------------------------------------------------------------------  Indications:       Speech disturbance and Weakness. Hypertensive  emergency (BP                    187/127 at time of exam) Risk Factors:      Hypertension, coronary artery disease, prior CVA. Other Factors:     CHF, CKD IV, polysubstance abuse, atrial fibrillation, on                    Xarelto. Limitations        Today's exam was limited due to constant movement, patient                    wanting to sit up straight during exam, altered mental                    status, heavy plaquing in bilateral ICAs. Comparison  Study:  Prior carotid duplex done at outside facility 11/14/2021                    indicated 50-69% right ICA stenosis by ICA/CCA systolic                    ratio. Performing Technologist: Sherren Kerns RVS  Examination Guidelines: A complete evaluation includes B-mode imaging, spectral Doppler, color Doppler, and power Doppler as needed of all accessible portions of each vessel. Bilateral testing is considered an integral part of a complete examination. Limited examinations for reoccurring indications may be performed as noted.  Right Carotid Findings: +----------+--------+--------+--------+----------------------+-----------------+           PSV cm/sEDV cm/sStenosisPlaque Description    Comments          +----------+--------+--------+--------+----------------------+-----------------+ CCA Prox  42      9               heterogenous                            +----------+--------+--------+--------+----------------------+-----------------+ CCA Distal40      7               heterogenous                            +----------+--------+--------+--------+----------------------+-----------------+ ICA Prox  192     58      40-59%  calcific and irregularcannot rule out                                                           higher velocities                                                         secondary to                                                              plaque formation                                                           and study                                                                 difficulty        +----------+--------+--------+--------+----------------------+-----------------+ ICA Mid   24      7                                                       +----------+--------+--------+--------+----------------------+-----------------+  ICA Distal23      10                                                      +----------+--------+--------+--------+----------------------+-----------------+ ECA       75      11              calcific                                +----------+--------+--------+--------+----------------------+-----------------+ +----------+--------+-------+--------+-------------------+           PSV cm/sEDV cmsDescribeArm Pressure (mmHG) +----------+--------+-------+--------+-------------------+ Subclavian102                                        +----------+--------+-------+--------+-------------------+ +---------+--------+--+--------+-+ VertebralPSV cm/s45EDV cm/s6 +---------+--------+--+--------+-+  Left Carotid Findings: +----------+--------+--------+--------+----------------------+-----------------+           PSV cm/sEDV cm/sStenosisPlaque Description    Comments          +----------+--------+--------+--------+----------------------+-----------------+ CCA Prox  87      25              heterogenous                            +----------+--------+--------+--------+----------------------+-----------------+ CCA Distal88      32              heterogenous                            +----------+--------+--------+--------+----------------------+-----------------+ ICA Prox  123     43      40-59%  calcific and irregularcannot rule out                                                           higher velocities                                                         secondary to                                                               plaque formation                                                          and study  difficulty        +----------+--------+--------+--------+----------------------+-----------------+ ICA Mid   116     43              calcific                                +----------+--------+--------+--------+----------------------+-----------------+ ICA Distal132     61              calcific                                +----------+--------+--------+--------+----------------------+-----------------+ ECA       90      5               calcific                                +----------+--------+--------+--------+----------------------+-----------------+ +----------+--------+--------+--------+-------------------+           PSV cm/sEDV cm/sDescribeArm Pressure (mmHG) +----------+--------+--------+--------+-------------------+ [id number]                                          +----------+--------+--------+--------+-------------------+ +---------+--------+--+--------+-+ VertebralPSV cm/s46EDV cm/s5 +---------+--------+--+--------+-+   Summary: Right Carotid: Velocities in the right ICA are consistent with a 40-59%                stenosis. Cannot rule out higher velocities secondary to plaque                formation and study difficulty. Left Carotid: Velocities in the left ICA are consistent with a 40-59% stenosis.               Cannot rule out higher velocities secondary to plaque formation               and study difficulty. Vertebrals:  Bilateral vertebral arteries demonstrate antegrade flow. Subclavians: Normal flow hemodynamics were seen in bilateral subclavian              arteries. *See table(s) above for measurements and observations.     Preliminary    CT CERVICAL SPINE WO CONTRAST  Result Date: 12/17/2022 CLINICAL DATA:  Pain EXAM: CT CERVICAL SPINE WITHOUT CONTRAST  TECHNIQUE: Multidetector CT imaging of the cervical spine was performed without intravenous contrast. Multiplanar CT image reconstructions were also generated. RADIATION DOSE REDUCTION: This exam was performed according to the departmental dose-optimization program which includes automated exposure control, adjustment of the mA and/or kV according to patient size and/or use of iterative reconstruction technique. COMPARISON:  Cervical spine CT 09/19/2022 FINDINGS: Alignment: Normal. Skull base and vertebrae: No acute fracture. No primary bone lesion or focal pathologic process. Soft tissues and spinal canal: No prevertebral fluid or swelling. No visible canal hematoma. Disc levels: There is mild disc space narrowing and endplate osteophyte formation at C5-C6 and C6-C7. No significant central canal or neural foraminal stenosis at any level. Upper chest: Negative. Other: There are atherosclerotic calcifications of the bilateral carotid artery bifurcations. IMPRESSION: No acute fracture or traumatic subluxation of the cervical spine. Electronically Signed   By: Darliss Cheney M.D.   On: 12/17/2022 18:17   CT HEAD CODE STROKE WO CONTRAST  Result Date: 12/17/2022 CLINICAL DATA:  Code stroke. Neuro deficit, acute, stroke  suspected. EXAM: CT HEAD WITHOUT CONTRAST TECHNIQUE: Contiguous axial images were obtained from the base of the skull through the vertex without intravenous contrast. RADIATION DOSE REDUCTION: This exam was performed according to the departmental dose-optimization program which includes automated exposure control, adjustment of the mA and/or kV according to patient size and/or use of iterative reconstruction technique. COMPARISON:  Head CT 09/19/2022. FINDINGS: Brain: No acute hemorrhage. 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 No new loss of gray-white differentiation. No hydrocephalus or extra-axial collection. No mass effect or midline  shift. Vascular: No hyperdense vessel or unexpected calcification. Skull: No calvarial fracture or suspicious bone lesion. Skull base is unremarkable. Sinuses/Orbits: No acute finding. Other: None. ASPECTS [facility] Stroke Program Early CT Score) - Ganglionic level infarction (caudate, lentiform nuclei, internal capsule, insula, M1-M3 cortex): 7 - Supraganglionic infarction (M4-M6 cortex): 3 Total score (0-10 with 10 being normal): 10 IMPRESSION: 1. No acute intracranial hemorrhage or evidence of acute large vessel territory infarct. ASPECT score is 10. 2. 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 Code stroke imaging results were communicated on 12/17/2022 at 6:06 pm to provider Dr. Selina Cooley via secure text paging. Electronically Signed   By: Orvan Falconer M.D.   On: 12/17/2022 18:06    Pending Labs Unresulted Labs (From admission, onward)     Start     Ordered   12/19/22 0500  Heparin level (unfractionated)  Daily,   R      12/17/22 2301   12/18/22 1500  Heparin level (unfractionated)  Once-Timed,   TIMED        12/18/22 0825            Vitals/Pain Today's Vitals   12/18/22 1130 12/18/22 1330 12/18/22 1359 12/18/22 1415  BP: (!) 185/119   (!) 189/99  Pulse: 76 78  71  Resp: 20 20  15   Temp:   98.1 F (36.7 C)   TempSrc:      SpO2: 93% 91%  95%  Weight:      Height:      PainSc:        Isolation Precautions No active isolations  Medications Medications  aspirin EC tablet 81 mg (81 mg Oral Not Given 12/18/22 0844)  entecavir (BARACLUDE) tablet 0.5 mg (0 mg Oral Hold 12/17/22 2100)  ezetimibe (ZETIA) tablet 10 mg (10 mg Oral Not Given 12/18/22 0845)  ALPRAZolam (XANAX) tablet 2 mg (has no administration in time range)  tamsulosin (FLOMAX) capsule 0.4 mg (0.4 mg Oral Not Given 12/18/22 0845)  mometasone-formoterol (DULERA) 200-5 MCG/ACT inhaler 2 puff (2 puffs Inhalation Not Given 12/18/22 0845)  acetaminophen (TYLENOL)  tablet 650 mg (has no administration in time range)    Or  acetaminophen (TYLENOL) suppository 650 mg (has no administration in time range)  ondansetron (ZOFRAN) tablet 4 mg ( Oral See Alternative 12/18/22 0058)    Or  ondansetron (ZOFRAN) injection 4 mg (4 mg Intravenous Given 12/18/22 0058)  melatonin tablet 10 mg (has no administration in time range)  heparin ADULT infusion 100 units/mL (25000 units/260mL) (1,700 Units/hr Intravenous Rate/Dose Change 12/18/22 0841)  labetalol (NORMODYNE) injection 20 mg (20 mg Intravenous Given 12/18/22 1138)  atorvastatin (LIPITOR) tablet 20 mg (20 mg Oral Not Given 12/18/22 0845)  0.9 % NaCl with KCl 40 mEq / L  infusion ( Intravenous New Bag/Given 12/18/22 1226)  potassium chloride 10 mEq in 100 mL IVPB (0 mEq  Intravenous Stopped 12/18/22 1400)  labetalol (NORMODYNE) injection 20 mg (20 mg Intravenous Given 12/18/22 1404)  sodium chloride flush (NS) 0.9 % injection 3 mL (3 mLs Intravenous Given 12/17/22 2000)   stroke: early stages of recovery book (1 each Does not apply Given 12/18/22 0844)  diazepam (VALIUM) injection 2.5 mg (2.5 mg Intravenous Given 12/18/22 0054)    Mobility non-ambulatory      R Recommendations: See Admitting Provider Note  Report given to:   Additional Notes: NIH 4, heparin gtt @17mL /hr, NS with 40 K @ 75 mL/hr, on his second krunner, is A&Ox4, having aphasia, wife at bedside, NPO, failed swallow

## 2022-12-18 NOTE — Evaluation (Signed)
Clinical/Bedside Swallow Evaluation Patient Details  Name: JADARRIUS DUHART MRN: [id number] Date of Birth: 10/22/1960  Today's Date: 12/18/2022 Time: SLP Start Time (ACUTE ONLY): 1037 SLP Stop Time (ACUTE ONLY): 1048 SLP Time Calculation (min) (ACUTE ONLY): 11 min  Past Medical History:  Past Medical History:  Diagnosis Date   Anxiety    Arthritis    Benzodiazepine dependence (HCC)    Bronchial asthma    CAD (coronary artery disease)    a. Cath 03/2010: mod RCA stenosis, severe diag stenosis, treated medically given lack of angina.   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 Episode in 2008 with spontaneous conversion, on amiodarone per EP.   S/P implantation of automatic cardioverter/defibrillator (AICD)    a. [employer], implanted 2012.   Sleep apnea    Stroke [facility]) 2004   Thyroid disease    Past Surgical History:  Past Surgical History:  Procedure Laterality Date   CARDIOVERSION N/A 01/07/2014   Procedure: CARDIOVERSION;  Surgeon: Lewayne Bunting, MD;  Location: [facility] ENDOSCOPY;  Service: Cardiovascular;  Laterality: N/A;   CARDIOVERSION N/A 02/11/2014   Procedure: CARDIOVERSION;  Surgeon: Chrystie Nose, MD;  Location: [facility] ENDOSCOPY;  Service: Cardiovascular;  Laterality: N/A;   CARDIOVERSION N/A 10/01/2020   Procedure: CARDIOVERSION;  Surgeon: Christell Constant, MD;  Location: [facility] ENDOSCOPY;  Service: Cardiovascular;  Laterality: N/A;   CATARACT EXTRACTION     COLONOSCOPY WITH PROPOFOL N/A 02/06/2018    Procedure: COLONOSCOPY WITH PROPOFOL;  Surgeon: Benancio Deeds, MD;  Location: [facility] ENDOSCOPY;  Service: Gastroenterology;  Laterality: N/A;   EYE SURGERY     ICD GENERATOR CHANGEOUT N/A 01/17/2020   Procedure: ICD GENERATOR CHANGEOUT;  Surgeon: Marinus Maw, MD;  Location: [facility] INVASIVE CV LAB;  Service: Cardiovascular;  Laterality: N/A;   LASEK eye surgery Bilateral 05/2018   LASIK Bilateral    PACEMAKER INSERTION     POLYPECTOMY  02/06/2018   Procedure: POLYPECTOMY;  Surgeon: Benancio Deeds, MD;  Location: [facility] ENDOSCOPY;  Service: Gastroenterology;;   TEE WITHOUT CARDIOVERSION N/A 01/07/2014   Procedure: TRANSESOPHAGEAL ECHOCARDIOGRAM (TEE);  Surgeon: Lewayne Bunting, MD;  Location: [facility] ENDOSCOPY;  Service: Cardiovascular;  Laterality: N/A;   TEE WITHOUT CARDIOVERSION N/A 02/11/2014   Procedure: TRANSESOPHAGEAL ECHOCARDIOGRAM (TEE);  Surgeon: Chrystie Nose, MD;  Location: [facility] ENDOSCOPY;  Service: Cardiovascular;  Laterality: N/A;   TEE WITHOUT CARDIOVERSION N/A 10/01/2020   Procedure: TRANSESOPHAGEAL ECHOCARDIOGRAM (TEE);  Surgeon: Christell Constant, MD;  Location: [facility] ENDOSCOPY;  Service: Cardiovascular;  Laterality: N/A;   TONSILLECTOMY     TRANSTHORACIC ECHOCARDIOGRAM  10/2006, 9/20164   HPI:  62 year old male history of obesity BMI 31.5, hypertension, coronary artery disease, history of 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  Patient's  girlfriend states that he got caught up between the bed and the wall and fell down.  He refused EMS transport.  Several hours later he was noted to have continued weakness and bilateral upper and lower extremities.  He also had dysarthria.  Patient finally agreed to come to the hospital. CT clear, not MARI compatible. hx of CVA.    Assessment / Plan /  Recommendation  Clinical Impression  Pt demonstrates concerning signs of dysphagia. He is able to orally accept sips of water, but posterior transit and swallow initaiton are intermittently absent despite pt effort. He did have some swallows, but also took small sips, never swallowed and had eventual delayed coughing. Additionally he could not consistently phonate on command or cough on command. At this time he is not safe for oral intake. Will f/u for further trials tomorrow; likely to need Cortrak if there is not significant spontaneous improvement. SLP Visit Diagnosis: Dysphagia, pharyngeal phase (R13.13)    Aspiration Risk  Risk for inadequate nutrition/hydration;Severe aspiration risk    Diet Recommendation NPO;Alternative means - temporary    Medication Administration: Via alternative means    Other  Recommendations      Recommendations for follow up therapy are one component of a multi-disciplinary discharge planning process, led by the attending physician.  Recommendations may be updated based on patient status, additional functional criteria and insurance authorization.  Follow up Recommendations Acute inpatient rehab (3hours/day)      Assistance Recommended at Discharge    Functional Status Assessment Patient has had a recent decline in their functional status and demonstrates the ability to make significant improvements in function in a reasonable and predictable amount of time.  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  Patient's girlfriend states that he got caught up between the bed and the wall and fell down.  He refused EMS transport.  Several hours later he was noted to have continued weakness and bilateral upper and lower extremities.  He also had dysarthria.  Patient finally agreed to come to the hospital. CT clear, not MARI compatible. hx of CVA. Type of Study: Bedside Swallow Evaluation Previous Swallow Assessment: none Diet Prior to this Study: NPO Temperature Spikes Noted: No Respiratory Status: Room air History of Recent Intubation: No Behavior/Cognition: Alert;Cooperative Oral Cavity Assessment: Within Functional Limits Oral Care Completed by SLP: No Oral Cavity - Dentition: Adequate natural dentition Vision: Functional for self-feeding Self-Feeding Abilities: Able to feed self Patient Positioning: Upright in bed Baseline Vocal Quality: Other (comment) (sometiems not able to initaite) Volitional Cough: Other (Comment) (unable to initaite) Volitional Swallow: Unable to elicit    Oral/Motor/Sensory Function     Ice Chips Ice chips: Not tested   Thin Liquid Thin Liquid: Impaired Presentation: Cup;Spoon Oral Phase Functional Implications: Prolonged oral transit;Oral holding Pharyngeal  Phase Impairments: Unable to trigger swallow;Cough - Delayed    Nectar Thick Nectar Thick Liquid: Not tested   Honey Thick Honey Thick Liquid: Not tested   Puree Puree: Not tested   Solid  Solid: Not tested      Claudine Mouton 12/18/2022,11:55 AM

## 2022-12-18 NOTE — Progress Notes (Addendum)
STROKE TEAM PROGRESS NOTE   BRIEF HPI This is a 62 yo man with pmhx significant for CAD, CHF, CKD stage IV, HL, HTN, a fib on xarelto, prior stroke who presents with L sided weakness and dysarthria. Outside of window for TNK and no LVO for thrombectomy. Nonadherent to zarelto but could not get exact reason due to dysarthria.    SIGNIFICANT HOSPITAL EVENTS 8/17 evening: presented to ED  INTERIM HISTORY/SUBJECTIVE Pt is unable to gather history from due to significant dysarthria. He is able to nod yes and no to questions. He nods yes to his weakness improving. He nods yes to not taking his zarelto recently. He was informed about his findings being consistent with acute stroke and that we would do more testing and optimize his risk factor treatment and restart his zarelto. We reported the importance of taking his zarelto. We asked if cost was a barrier to zarelto adherence and he shaked head "no."  Per radiology staff, pt is unable to go to MRI due ICD which has parts for both [employer] and [employer]. Informed that a company rep would not be able to turn off both devices.   Per SLP, patient has dysphagia and is NPO.    OBJECTIVE  CBC    Component Value Date/Time   WBC 9.4 12/17/2022 1748   RBC 5.57 12/17/2022 1748   HGB 17.7 (H) 12/17/2022 1753   HGB 16.0 06/02/2022 1426   HCT 52.0 12/17/2022 1753   HCT 46.7 06/02/2022 1426   PLT 170 12/17/2022 1748   PLT 185 06/02/2022 1426   MCV 90.7 12/17/2022 1748   MCV 91 06/02/2022 1426   MCH 30.3 12/17/2022 1748   MCHC 33.5 12/17/2022 1748   RDW 13.2 12/17/2022 1748   RDW 15.1 06/02/2022 1426   LYMPHSABS 1.1 12/17/2022 1748   LYMPHSABS 0.8 10/15/2020 1118   MONOABS 0.6 12/17/2022 1748   EOSABS 0.1 12/17/2022 1748   EOSABS 0.1 10/15/2020 1118   BASOSABS 0.1 12/17/2022 1748   BASOSABS 0.0 10/15/2020 1118    BMET    Component Value Date/Time   NA 141 12/18/2022 0730   NA 133 (L) 06/02/2022 1426   K 3.1 (L) 12/18/2022 0730    CL 109 12/18/2022 0730   CO2 21 (L) 12/18/2022 0730   GLUCOSE 122 (H) 12/18/2022 0730   BUN 18 12/18/2022 0730   BUN 26 (H) 06/02/2022 1426   CREATININE 1.79 (H) 12/18/2022 0730   CREATININE 1.38 (H) 02/08/2016 1155   CALCIUM 9.2 12/18/2022 0730   EGFR 29 (L) 06/02/2022 1426   GFRNONAA 43 (L) 12/18/2022 0730    IMAGING past 24 hours VAS US CAROTID (at [facility] and [facility] only)  Result Date: 12/18/2022 Carotid Arterial Duplex Study Patient Name:  LAWRANCE HEINICKE  Date of Exam:   12/18/2022 Medical Rec #: [id number]         Accession #:    [id number] Date of Birth: [birth year]         Patient Gender: M Patient Age:   29 years Exam Location:  [facility] Procedure:      VAS US CAROTID Referring Phys: ERIC CHEN --------------------------------------------------------------------------------  Indications:       Speech disturbance and Weakness. Hypertensive emergency (BP                    187/127 at time of exam) Risk Factors:      Hypertension, coronary artery disease, prior CVA. Other Factors:     CHF,  CKD IV, polysubstance abuse, atrial fibrillation, on                    Xarelto. Limitations        Today's exam was limited due to constant movement, patient                    wanting to sit up straight during exam, altered mental                    status, heavy plaquing in bilateral ICAs. Comparison Study:  Prior carotid duplex done at outside facility 11/14/2021                    indicated 50-69% right ICA stenosis by ICA/CCA systolic                    ratio. Performing Technologist: Sherren Kerns RVS  Examination Guidelines: A complete evaluation includes B-mode imaging, spectral Doppler, color Doppler, and power Doppler as needed of all accessible portions of each vessel. Bilateral testing is considered an integral part of a complete examination. Limited examinations for reoccurring indications may be performed as noted.  Right Carotid Findings:  +----------+--------+--------+--------+----------------------+-----------------+           PSV cm/sEDV cm/sStenosisPlaque Description    Comments          +----------+--------+--------+--------+----------------------+-----------------+ CCA Prox  42      9               heterogenous                            +----------+--------+--------+--------+----------------------+-----------------+ CCA Distal40      7               heterogenous                            +----------+--------+--------+--------+----------------------+-----------------+ ICA Prox  192     58      40-59%  calcific and irregularcannot rule out                                                           higher velocities                                                         secondary to                                                              plaque formation  and study                                                                 difficulty        +----------+--------+--------+--------+----------------------+-----------------+ ICA Mid   24      7                                                       +----------+--------+--------+--------+----------------------+-----------------+ ICA Distal23      10                                                      +----------+--------+--------+--------+----------------------+-----------------+ ECA       75      11              calcific                                +----------+--------+--------+--------+----------------------+-----------------+ +----------+--------+-------+--------+-------------------+           PSV cm/sEDV cmsDescribeArm Pressure (mmHG) +----------+--------+-------+--------+-------------------+ Subclavian102                                        +----------+--------+-------+--------+-------------------+ +---------+--------+--+--------+-+  VertebralPSV cm/s45EDV cm/s6 +---------+--------+--+--------+-+  Left Carotid Findings: +----------+--------+--------+--------+----------------------+-----------------+           PSV cm/sEDV cm/sStenosisPlaque Description    Comments          +----------+--------+--------+--------+----------------------+-----------------+ CCA Prox  87      25              heterogenous                            +----------+--------+--------+--------+----------------------+-----------------+ CCA Distal88      32              heterogenous                            +----------+--------+--------+--------+----------------------+-----------------+ ICA Prox  123     43      40-59%  calcific and irregularcannot rule out                                                           higher velocities                                                         secondary  to                                                              plaque formation                                                          and study                                                                 difficulty        +----------+--------+--------+--------+----------------------+-----------------+ ICA Mid   116     43              calcific                                +----------+--------+--------+--------+----------------------+-----------------+ ICA Distal132     61              calcific                                +----------+--------+--------+--------+----------------------+-----------------+ ECA       90      5               calcific                                +----------+--------+--------+--------+----------------------+-----------------+ +----------+--------+--------+--------+-------------------+           PSV cm/sEDV cm/sDescribeArm Pressure (mmHG) +----------+--------+--------+--------+-------------------+ [id number]                                           +----------+--------+--------+--------+-------------------+ +---------+--------+--+--------+-+ VertebralPSV cm/s46EDV cm/s5 +---------+--------+--+--------+-+   Summary: Right Carotid: Velocities in the right ICA are consistent with a 40-59%                stenosis. Cannot rule out higher velocities secondary to plaque                formation and study difficulty. Left Carotid: Velocities in the left ICA are consistent with a 40-59% stenosis.               Cannot rule out higher velocities secondary to plaque formation               and study difficulty. Vertebrals:  Bilateral vertebral arteries demonstrate antegrade flow. Subclavians: Normal flow hemodynamics were seen in bilateral subclavian              arteries. *See table(s) above for measurements and observations.     Preliminary    CT CERVICAL SPINE WO CONTRAST  Result Date: 12/17/2022 CLINICAL DATA:  Pain EXAM: CT CERVICAL SPINE WITHOUT CONTRAST TECHNIQUE: Multidetector CT imaging of the cervical spine was performed without intravenous contrast. Multiplanar CT image reconstructions were also generated. RADIATION DOSE REDUCTION: This exam was performed according to the departmental dose-optimization program which includes automated exposure control, adjustment of the mA and/or kV according to patient size and/or use of iterative reconstruction technique. COMPARISON:  Cervical spine CT 09/19/2022 FINDINGS: Alignment: Normal. Skull base and vertebrae: No acute fracture. No primary bone lesion or focal pathologic process. Soft tissues and spinal canal: No prevertebral fluid or swelling. No visible canal hematoma. Disc levels: There is mild disc space narrowing and endplate osteophyte formation at C5-C6 and C6-C7. No significant central canal or neural foraminal stenosis at any level. Upper chest: Negative. Other: There are atherosclerotic calcifications of the bilateral carotid artery bifurcations. IMPRESSION: No acute fracture or traumatic  subluxation of the cervical spine. Electronically Signed   By: Darliss Cheney M.D.   On: 12/17/2022 18:17   CT HEAD CODE STROKE WO CONTRAST  Result Date: 12/17/2022 CLINICAL DATA:  Code stroke. Neuro deficit, acute, stroke suspected. EXAM: CT HEAD WITHOUT CONTRAST TECHNIQUE: Contiguous axial images were obtained from the base of the skull through the vertex without intravenous contrast. RADIATION DOSE REDUCTION: This exam was performed according to the departmental dose-optimization program which includes automated exposure control, adjustment of the mA and/or kV according to patient size and/or use of iterative reconstruction technique. COMPARISON:  Head CT 09/19/2022. FINDINGS: Brain: No acute hemorrhage. 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 No new loss of gray-white differentiation. No hydrocephalus or extra-axial collection. No mass effect or midline shift. Vascular: No hyperdense vessel or unexpected calcification. Skull: No calvarial fracture or suspicious bone lesion. Skull base is unremarkable. Sinuses/Orbits: No acute finding. Other: None. ASPECTS [facility] Stroke Program Early CT Score) - Ganglionic level infarction (caudate, lentiform nuclei, internal capsule, insula, M1-M3 cortex): 7 - Supraganglionic infarction (M4-M6 cortex): 3 Total score (0-10 with 10 being normal): 10 IMPRESSION: 1. No acute intracranial hemorrhage or evidence of acute large vessel territory infarct. ASPECT score is 10. 2. 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 Code stroke imaging results were communicated on 12/17/2022 at 6:06 pm to provider Dr. Selina Cooley via secure text paging. Electronically Signed   By: Orvan Falconer M.D.   On: 12/17/2022 18:06    Vitals:   12/18/22 1115 12/18/22 1130 12/18/22 1330 12/18/22 1359  BP: (!) 187/127 (!) 185/119    Pulse: 73 76 78   Resp: 20 20 20    Temp:    98.1 F  (36.7 C)  TempSrc:      SpO2: 96% 93% 91%   Weight:      Height:         PHYSICAL EXAM General:  alert, uncomfortably laying in bed with apparent extremity weakness, unable to communicate thougthts aside from nodding yes and no and moving lips.  Respiratory:  Regular, unlabored respirations on room air  NEURO:  Mental Status: AA&Ox3 read from lips moving, patient is not able to give clear and coherent history Speech/Language: speech is with dysarthria.  Pt attempts to produce speech but has frequent aphonia. Responds to simple commands and appears to understand speech per conversation. Cannot repeat.   Cranial Nerves:  II: PERRL. Visual fields full.  III, IV, VI: EOMI. Eyelids elevate symmetrically.  V: Sensation is intact to light touch and symmetrical to face.  VII: Face is symmetrical resting and smiling VIII: hearing intact to voice. IX, X: Palate elevates symmetrically. Phonation is normal.  KG:[id number] shrug 5/5. XII: tongue is midline without fasciculations. Motor: 5/5 strength to all muscle groups tested, except RUE 3/5 strength Tone: is normal and bulk is normal Sensation- Intact to light touch bilaterally. Extinction absent to light touch to DSS.   Coordination: FTN intact bilaterally, HKS: no ataxia in BLE.No drift.  Gait- deferred   ASSESSMENT/PLAN  Acute Ischemic Infarct suspected Etiology:  cardioembolic (nonadherent on Xarelto)  Code Stroke CT head No acute abnormality. ASPECTS 10.    CT cervical spine no acute fracture or subluxation  CT head 24hr post stroke pending Not able to have MRI due to generator and leads are from different providers Carotid Doppler b/l ICA 40-59% stenosis 2D Echo pending LDL 88 HgbA1c 5.5 VTE prophylaxis - on heparin IV Xarelto (rivaroxaban) daily and aspirin 81 prior to admission, now on heparin IV. Will resume Xarelto and aspirin 81 once p.o. access. Therapy recommendations:  CIR Disposition:  pending  Hx of  Stroke/TIA CVA 16 years ago with residue visual difficulty 10/2021 admitted in [place] for generalized weakness and slurred speech.  CT no acute finding.  CT head and neck multiple severe intracranial stenosis, most notably moderate severe stenosis right M1, short segment proximal right A1, moderate severe stenosis of basilar artery.  Right P2 and P3 segment of occlusion.  Left V4 occlusion, severe near complete atherosclerotic narrowing of bilateral ICAs, right VA origin stenosis.  Continue on aspirin, Xarelto and Lipitor.  Atrial fibrillation Home Meds: Xarelto 20 mg once daily, carvedilol 12.5 mg one and half pills twice daily, amiodarone 200 once dialy Continue telemetry monitoring On heparin IV.  Will resume Xarelto and aspirin 81 once p.o. access  Hypertension Home meds:  carvedilol 12.5 mg one and half pills twice daily, entresto 49-51 mg twice daily, hydralazine 100 mg twice daily  Unstable, labetalol 20 mg IV as needed if BP greater than SBP>180/DBP>105  Blood Pressure Goal: BP less than 180/105 due to restarting anticoagulant.  Long-term BP goal normotensive  Hyperlipidemia Home meds:  atorvastatin 10 mg and zetia 10 mg once daily  LDL 88, goal < 70 Increase Lipitor to 20 and continue Zetia 10. Continue statin at discharge  CHF CAD Status post ICD in 2012, generator change out in 2021 Not compatible with MRI due to generator and leads from different companies On GDMT at home Resume home meds once p.o. access  Substance Use Patient uses cannabis UDS positive for [facility]  Cessation education will be provided  Dysphagia Patient has post-stroke dysphagia, SLP consulted N.p.o. for now Resume home meds once p.o. access On IV fluid  Other Stroke Risk Factors Obesity, Body mass index is 31.52 kg/m., BMI >/= 30 associated with increased stroke risk, recommend weight loss, diet and exercise as appropriate  History of cocaine use OSA not on CPAP  Other Active Problems HFrEF  s/p AICD, managed by primary team, AICD interrogation planned Hypokalemia, K 3.1-> supplement CKD, Cr lowest its been in 6 months  Hospital day # 0  Meryl Dare, MD PGY-1 Psychiatry Resident 12/18/2022, 2:24 PM  ATTENDING NOTE: I reviewed above note and agree with the assessment and plan. Pt was seen and examined.   No family at bedside.  Patient lying in bed, awake alert, orientated to self and age but not to time.  However soft voice, hypophonia, paucity of speech but following simple commands.  No gaze palsy, blinking  to be decided bilaterally, no facial droop.  Right upper extremity mild drift, left upper extremity 4/5, bilateral lower extremities 3/5 seems symmetrical.  Sensation, coordination not corporative and gait not tested.  Patient unable to have MRI due to generator and leads are from 2 different companies.  Will repeat CT in 24 hours.  Carotid Doppler showed bilateral ICA 40 to 59% stenosis.  Pending 2D echo.  Patient creatinine high, not candidate for CTA head and neck.  No need to pursue intracranial imaging as not changing management.  Currently n.p.o., on heparin IV.  Once p.o. access, will resume aspirin and Xarelto as well as other GDMT meds.  BP high 190-200, given on heparin IV, recommend BP goal less than 180/105, put on labetalol as needed.  PT and OT recommend CIR.  Will follow.  For detailed assessment and plan, please refer to above/below as I have made changes wherever appropriate.   Marvel Plan, MD PhD Stroke Neurology 12/18/2022 6:24 PM     To contact Stroke Continuity provider, please refer to [email or web address]. After hours, contact General Neurology

## 2022-12-18 NOTE — Evaluation (Addendum)
Physical Therapy Evaluation Patient Details Name: Cody Ortega MRN: [id number] DOB: 07/23/1960 Today's Date: 12/18/2022  History of Present Illness  Pt is a 62 y.o. male admitted 12/17/22 after fall at home, refusing EMS transport, then having progressive BUE/BLE weakness and dysarthria. Head and cervical CT negative for acute findings; chronic R parietal lobe and bilateral basal ganglia lacunar infarcts. No MRI due to AICD. PMH includes, HTN, CAD, HF, AICD, CKD3b, COPD, asthma, OSA, afib on Xarelto, HLD, cirrhosis, Hep B, anxiety, depression.   Clinical Impression  Pt presents with an overall decrease in functional mobility secondary to above. PTA, pt lives with spouse, retired from work, reports independent with mobility (intermittent use of RW?). Today, pt requiring minA for bed mobility, deferred standing activity secondary to BP 205/150 (MAP 163); MD aware. Pt limited by generalized weakness, impaired coordination, and impaired cognition, including expressive communication deficits. Pt would benefit from post-acute rehab services to maximize functional mobility and independence prior to return home.       If plan is discharge home, recommend the following: A lot of help with walking and/or transfers;A lot of help with bathing/dressing/bathroom;Assistance with cooking/housework;Assist for transportation;Help with stairs or ramp for entrance   Can travel by private vehicle        Equipment Recommendations Other (comment) (TBD)  Recommendations for Other Services       Functional Status Assessment Patient has had a recent decline in their functional status and demonstrates the ability to make significant improvements in function in a reasonable and predictable amount of time.     Precautions / Restrictions Precautions Precautions: Fall;Other (comment) Precaution Comments: watch hypertension Restrictions Weight Bearing Restrictions: No      Mobility  Bed Mobility Overal bed  mobility: Needs Assistance Bed Mobility: Supine to Sit, Rolling Rolling: Modified independent (Device/Increase time), Used rails   Supine to sit: Min assist, Used rails     General bed mobility comments: mod indep to roll onto L side with heavy bedrail use; minA for trunk elevation to come to long sitting, pt heavily relying on BUE bed rail support to pull    Transfers                   General transfer comment: deferred secondary to HTN    Ambulation/Gait                  Stairs            Wheelchair Mobility     Tilt Bed    Modified Rankin (Stroke Patients Only) Modified Rankin (Stroke Patients Only) Pre-Morbid Rankin Score: No symptoms Modified Rankin: Moderately severe disability     Balance Overall balance assessment: Needs assistance   Sitting balance-Leahy Scale: Fair Sitting balance - Comments: can maintain static balance while long sitting without UE support                                     Pertinent Vitals/Pain Pain Assessment Pain Assessment: Faces Faces Pain Scale: Hurts little more Pain Location: lower back Pain Descriptors / Indicators: Discomfort Pain Intervention(s): Monitored during session, Repositioned    Home Living Family/patient expects to be discharged to:: Private residence Living Arrangements: Spouse/significant other Available Help at Discharge: Family;Available PRN/intermittently Type of Home: House             Additional Comments: wife works from home    Prior Function Prior Level  of Function : Independent/Modified Independent             Mobility Comments: pt reports independent (intermittent use of RW? pt with expressive difficulties); retired from work       Extremity/Trunk Assessment   Upper Extremity Assessment Upper Extremity Assessment: Generalized weakness (gross functional strength >3/5, apparent dysmetria; pt endorses numbness/tingling)    Lower Extremity  Assessment Lower Extremity Assessment: Generalized weakness (pt denies sensation changes)       Communication   Communication Communication: Difficulty communicating thoughts/reduced clarity of speech  Cognition Arousal: Alert Behavior During Therapy: WFL for tasks assessed/performed Overall Cognitive Status: Impaired/Different from baseline                                 General Comments: pt with apparent expressive aphasia(?) difficulty with some word finding and difficulty getting words out, requires cues to speak up/use voice; answering majority of questions appropriately, especially yes/no, and following majority of simple commands        General Comments General comments (skin integrity, edema, etc.): 205/150 (163), HR 84; reached out to Dr. Alanda Slim regarding permissive HTN values for mobility, who stated TBD until pt able to take meds    Exercises     Assessment/Plan    PT Assessment Patient needs continued PT services  PT Problem List Decreased strength;Decreased activity tolerance;Decreased balance;Decreased mobility;Decreased coordination;Decreased cognition;Decreased knowledge of use of DME;Cardiopulmonary status limiting activity;Pain       PT Treatment Interventions DME instruction;Gait training    PT Goals (Current goals can be found in the Care Plan section)  Acute Rehab PT Goals Patient Stated Goal: feel better PT Goal Formulation: With patient Time For Goal Achievement: 01/01/23 Potential to Achieve Goals: Good    Frequency Min 1X/week     Co-evaluation               AM-PAC PT "6 Clicks" Mobility  Outcome Measure Help needed turning from your back to your side while in a flat bed without using bedrails?: A Lot Help needed moving from lying on your back to sitting on the side of a flat bed without using bedrails?: A Lot Help needed moving to and from a bed to a chair (including a wheelchair)?: A Lot Help needed standing up from a  chair using your arms (e.g., wheelchair or bedside chair)?: A Lot Help needed to walk in hospital room?: A Lot Help needed climbing 3-5 steps with a railing? : A Lot 6 Click Score: 12    End of Session   Activity Tolerance: Patient tolerated treatment well;Treatment limited secondary to medical complications (Comment) (HTN) Patient left: in bed;with call bell/phone within reach;Other (comment) (ED stretcher; SLP present for eval) Nurse Communication: Mobility status;Other (comment) (HTN) PT Visit Diagnosis: Other abnormalities of gait and mobility (R26.89);Other symptoms and signs involving the nervous system (R29.898)    Time: 1308-6578 PT Time Calculation (min) (ACUTE ONLY): 24 min   Charges:   PT Evaluation $PT Eval Moderate Complexity: 1 Mod   PT General Charges $$ ACUTE PT VISIT: 1 Visit        Ina Homes, PT, DPT Acute Rehabilitation Services  Personal: Secure Chat Rehab Office: [phone]  Malachy Chamber 12/18/2022, 12:53 PM

## 2022-12-18 NOTE — ED Notes (Addendum)
Pt is still hypertensive after Hyrdralazine administered at 0245. Dr Imogene Burn made aware and values are "permissive hypertension for TIA".

## 2022-12-18 NOTE — Progress Notes (Addendum)
ANTICOAGULATION CONSULT NOTE - Initial Consult  Pharmacy Consult for Xarelto > heparin Indication: atrial fibrillation  Allergies  Allergen Reactions   Prochlorperazine Other (See Comments)    Medication caused a lot of issues like blurred vision, nausea, pain, and carpool turnel   Codeine Itching, Nausea And Vomiting and Hives   Hydrocodone-Acetaminophen Itching and Nausea And Vomiting    Patient Measurements: Height: 6\' 3"  (190.5 cm) Weight: 114.4 kg (252 lb 3.3 oz) IBW/kg (Calculated) : 84.5 Heparin Dosing Weight: 108.3 kg   Vital Signs: Temp: 97.5 F (36.4 C) (08/18 0416) Temp Source: Oral (08/18 0416) BP: 205/131 (08/18 0815) Pulse Rate: 81 (08/18 0815)  Labs: Recent Labs    12/17/22 1748 12/17/22 1753 12/18/22 0730  HGB 16.9 17.7*  --   HCT 50.5 52.0  --   PLT 170  --   --   APTT 26  --  38*  LABPROT 13.6  --   --   INR 1.0  --   --   HEPARINUNFRC  --   --  0.12*  CREATININE 1.92* 1.90* 1.79*    Estimated Creatinine Clearance: 59.9 mL/min (A) (by C-G formula based on SCr of 1.79 mg/dL (H)).   Medical History: Past Medical History:  Diagnosis Date   Anxiety    Arthritis    Benzodiazepine dependence (HCC)    Bronchial asthma    CAD (coronary artery disease)    a. Cath 03/2010: mod RCA stenosis, severe diag stenosis, treated medically given lack of angina.   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 Episode in 2008 with spontaneous conversion, on amiodarone per EP.   S/P implantation of automatic  cardioverter/defibrillator (AICD)    a. [employer], implanted 2012.   Sleep apnea    Stroke [facility]) 2004   Thyroid disease     Medications:  Scheduled:    stroke: early stages of recovery book   Does not apply Once   aspirin EC  81 mg Oral Daily   atorvastatin  10 mg Oral Daily   DULoxetine  90 mg Oral Q breakfast   entecavir  0.5 mg Oral QODAY   ezetimibe  10 mg Oral Daily   mometasone-formoterol  2 puff Inhalation BID   tamsulosin  0.4 mg Oral Daily    Assessment: 62 yom presenting with weakness/dysarthria, CT showing no acute infarcts but old infarct - on Xarelto PTA for hx Afib (LD 8/16 PM). Did not pass bedside swallow exam so will bridge with heparin for now.   Heparin level came back subtherapeutic at 0.12 and aPTT also came back low at 38 (correlating), on heparin infusion at 1500 units/hr. No s/sx of bleeding or infusion issues.   Goal of Therapy:  Heparin level 0.3-0.5 units/ml aPTT 66-85 seconds Monitor platelets by anticoagulation protocol: Yes   Plan:  Increase heparin infusion to 1700 units/hr  Order level in 6 hrs  Monitor daily HL, CBC, and  s/sx of bleeding   Thank you for allowing pharmacy to participate in this patient's care,  Sherron Monday, PharmD, BCCCP Clinical Pharmacist  Phone: [phone] 12/18/2022 8:20 AM  Please check AMION for all [facility] Pharmacy phone numbers After 10:00 PM, call Main Pharmacy [phone]

## 2022-12-18 NOTE — Progress Notes (Signed)
PROGRESS NOTE  Cody Ortega VQQ:[id number] DOB: [birth year]   PCP: Marcine Matar, MD  Patient is from: Home  DOA: 12/17/2022 LOS: 0  Chief complaints Chief Complaint  Patient presents with   Code Stroke     Brief Narrative / Interim history: 62 year old M with PMH of COPD, OSA, systolic CHF s/p AICD, A-fib on Xarelto, DM-2, HTN, CKD-3B, liver cirrhosis due to hepatitis B and obesity presenting with fall, weakness in all extremities and dysarthria after he was caught between the bed and the wall.  Initially refused to come to the hospital but agreed when he continued to have weakness and dysarthria.  Was not a candidate for TNKase since he was outside the window and he is also on Xarelto.  CT head and CT C-spine without acute finding.  AICD not compatible with MRI.  Neurology consulted and recommended repeat CT head 12 hours later.  Failed swallowing eval and started on IV heparin.  Markedly elevated BP.  Subjective: Seen and examined earlier this morning.  No major events overnight of this morning.  Very hypertensive.  He has significant dysarthria.  Denies headache.  Reports weakness in all extremities, not localized.  Denies numbness or tingling.  Denies chest pain or dyspnea.  Denies GI or UTI symptoms.  Objective: Vitals:   12/18/22 1015 12/18/22 1045 12/18/22 1115 12/18/22 1130  BP: (!) 202/126 (!) 202/127 (!) 187/127 (!) 185/119  Pulse: 72 81 73 76  Resp: (!) 28 20 20 20   Temp:      TempSrc:      SpO2: 93% 95% 96% 93%  Weight:      Height:        Examination:  GENERAL: No apparent distress.  Nontoxic. HEENT: MMM.  Vision and hearing grossly intact.  NECK: Supple.  No apparent JVD.  RESP:  No IWOB.  Fair aeration bilaterally. CVS:  RRR. Heart sounds normal.  ABD/GI/GU: BS+. Abd soft, NTND.  MSK/EXT:  Moves extremities. No apparent deformity. No edema.  SKIN: no apparent skin lesion or wound NEURO: Awake and alert.  Seems to be fairly oriented.  Has significant  dysarthria.  Follows commands appropriately.  No facial asymmetry.  PERRL.  Tongue protrusion symmetric.  Motor 4/5 in all muscle groups of UE and LE bilaterally, Normal tone. Light sensation intact and symmetric in extremities and face.  Patellar reflex symmetric.  No pronator drift.  PSYCH: Calm. Normal affect.   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  CT head and CT cervical spine without acute finding.  Failed swallowing eval.  No focal neurodeficit on exam other than significant dysarthria.  Markedly elevated BP.  UDS positive for THC and benzo.  Patient reports taking his Xarelto religiously.  LDL 88.  A1c 5.5. -Neurology on board -Continue IV heparin until he passes swallowing eval -Permissive hypertension to 220/110.  Labetalol as needed ordered -Follow repeat CT head.  AICD not compatible  for MRI. -Will request cardiology to help with AICD interrogation given fall. -Follow echocardiogram and carotid Doppler. -PT/OT/SLP -Aspiration and fall precautions   Chronic systolic CHF s/p AICD: TTE in 6213 with LVEF of 30 to 35%, GH, severe LAE, mild to moderate MR. Appears euvolemic on exam.  On Lasix 20 mg daily.  Home GDMT includes Coreg, Entresto and Farxiga. -Closely monitor respiratory and fluid status while on IV fluid due to n.p.o. -Continue home meds when able to take p.o. -Follow  echocardiogram  Chronic atrial fibrillation: Rate controlled.  On amiodarone, Coreg and Xarelto at home.  Reports compliance. -IV heparin while n.p.o. -Change IV hydralazine to IV labetalol  CAD: No cardiopulmonary symptoms. -Continue home meds when able to take p.o. safely  CKD-3B: Baseline Cr ~ranges from 2-2.4.  Cr seem to be better than baseline. Recent Labs    02/04/22 1403 06/02/22 1426 08/10/22 1430 09/19/22 0313 12/17/22 1748 12/17/22 1753 12/18/22 0730  BUN 34* 26* 26* 30* 19 30* 18  CREATININE 2.47* 2.52* 2.49* 2.43* 1.92* 1.90* 1.79*  -Continue monitoring  Fall at home: Likely due to stroke.  He is also at risk for polypharmacy. -Fall precaution -PT/OT   COPD/asthma/OSA: Stable -Bronchodilators as needed  Uncontrolled hypertension: BP elevated. -Permissive hypertension -Labetalol as needed   HLD: LDL 88. -Home Lipitor when able to take p.o.   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 -Encourage lifestyle change to lose weight         DVT prophylaxis:  SCDs Start: 12/17/22 2021 On full dose anticoagulation Code Status: Full code Family Communication: None at bedside Level of care: Telemetry Medical Status is: Observation The patient will require care spanning > 2 midnights and should be moved to inpatient because: Acute CVA with significant dysarthria, uncontrolled hypertension concerning for hypertensive emergency   Final disposition: Home Consultants:  Neurology  55 minutes with more than 50% spent in reviewing records, counseling patient/family and coordinating care.   Sch Meds:  Scheduled Meds:  aspirin EC  81 mg Oral Daily   atorvastatin  20 mg Oral Daily   entecavir  0.5 mg Oral QODAY   ezetimibe  10 mg Oral Daily   mometasone-formoterol  2 puff Inhalation BID   tamsulosin  0.4 mg Oral Daily   Continuous  Infusions:  0.9 % NaCl with KCl 40 mEq / L     heparin 1,700 Units/hr (12/18/22 0841)   potassium chloride     PRN Meds:.acetaminophen **OR** acetaminophen, alprazolam, labetalol, melatonin, ondansetron **OR** ondansetron (ZOFRAN) IV  Antimicrobials: Anti-infectives (From admission, onward)    Start     Dose/Rate Route Frequency Ordered Stop   12/17/22 2030  entecavir (BARACLUDE) tablet 0.5 mg        0.5 mg Oral Every other day 12/17/22 2020          I have personally reviewed the following labs and images: CBC: Recent Labs  Lab 12/17/22 1748 12/17/22 1753  WBC 9.4  --   NEUTROABS 7.5  --   HGB 16.9 17.7*  HCT 50.5 52.0  MCV 90.7  --   PLT 170  --    BMP &GFR Recent Labs  Lab 12/17/22 1748 12/17/22 1753 12/18/22 0730  NA 141 142 141  K 3.6 4.5 3.1*  CL 110 111 109  CO2 20*  --  21*  GLUCOSE 129* 123* 122*  BUN 19 30* 18  CREATININE 1.92* 1.90* 1.79*  CALCIUM 9.4  --  9.2  MG  --   --  2.0  PHOS  --   --  2.8   Estimated Creatinine Clearance: 59.9 mL/min (A) (by C-G formula based on SCr of 1.79 mg/dL (H)). Liver & Pancreas: Recent Labs  Lab 12/17/22 1748 12/18/22 0730  AST 14*  --   ALT 13  --   ALKPHOS 57  --   BILITOT 0.9  --   PROT 6.8  --   ALBUMIN 3.9 3.7   No results for input(s): "LIPASE", "AMYLASE" in the last 168 hours. No results for input(s): "AMMONIA" in the last 168 hours. Diabetic: Recent Labs    12/18/22 0114  HGBA1C 5.5   Recent Labs  Lab 12/17/22 1751  GLUCAP 113*   Cardiac Enzymes: No results for input(s): "CKTOTAL", "CKMB", "CKMBINDEX", "TROPONINI" in the last 168 hours. No results for input(s): "PROBNP" in the last 8760 hours. Coagulation Profile: Recent Labs  Lab 12/17/22 1748  INR 1.0   Thyroid Function Tests: No results for input(s): "TSH", "T4TOTAL", "FREET4", "T3FREE", "THYROIDAB" in the last 72 hours. Lipid Profile: Recent Labs    12/18/22 0114  CHOL 136  HDL 36*  LDLCALC 88  TRIG 59  CHOLHDL 3.8    Anemia Panel: No results for input(s): "VITAMINB12", "FOLATE", "FERRITIN", "TIBC", "IRON", "RETICCTPCT" in the last 72 hours. Urine analysis:    Component Value Date/Time   COLORURINE STRAW (A) 09/19/2022 0518   APPEARANCEUR CLEAR 09/19/2022 0518   LABSPEC 1.004 (L) 09/19/2022 0518   PHURINE 5.0 09/19/2022 0518   GLUCOSEU 150 (A) 09/19/2022 0518   HGBUR SMALL (A) 09/19/2022 0518   BILIRUBINUR NEGATIVE 09/19/2022 0518   KETONESUR NEGATIVE 09/19/2022 0518   PROTEINUR NEGATIVE 09/19/2022 0518   UROBILINOGEN 1.0 03/04/2015 2105   NITRITE NEGATIVE 09/19/2022 0518   LEUKOCYTESUR NEGATIVE 09/19/2022 0518   Sepsis Labs: Invalid input(s): "PROCALCITONIN", "LACTICIDVEN"  Microbiology: No results found for this or any previous visit (from the past 240 hour(s)).  Radiology Studies: CT CERVICAL SPINE WO CONTRAST  Result Date: 12/17/2022 CLINICAL DATA:  Pain EXAM: CT CERVICAL SPINE WITHOUT CONTRAST TECHNIQUE: Multidetector CT imaging of the cervical spine was performed without intravenous contrast. Multiplanar CT image reconstructions were also generated. RADIATION DOSE REDUCTION: This exam was performed according to the departmental dose-optimization program which includes automated exposure control, adjustment of the mA and/or kV according to patient size and/or use of iterative reconstruction technique. COMPARISON:  Cervical spine CT 09/19/2022 FINDINGS: Alignment: Normal. Skull base and vertebrae: No acute fracture. No primary bone lesion or focal pathologic process. Soft tissues and spinal canal: No prevertebral fluid or swelling. No visible canal hematoma. Disc levels: There is mild disc space narrowing and endplate osteophyte formation at C5-C6 and C6-C7. No significant central canal or neural foraminal stenosis at any level. Upper chest: Negative. Other: There are atherosclerotic calcifications of the bilateral carotid artery bifurcations. IMPRESSION: No acute fracture or traumatic  subluxation of the cervical spine. Electronically Signed   By: Darliss Cheney M.D.   On: 12/17/2022 18:17   CT HEAD CODE STROKE WO CONTRAST  Result Date: 12/17/2022 CLINICAL DATA:  Code stroke. Neuro deficit, acute, stroke suspected. EXAM: CT HEAD WITHOUT CONTRAST TECHNIQUE: Contiguous axial images were obtained from the base of the skull through the vertex without intravenous contrast. RADIATION DOSE REDUCTION: This exam was performed according to the departmental dose-optimization program which includes automated exposure control, adjustment of the mA and/or kV according to patient size  and/or use of iterative reconstruction technique. COMPARISON:  Head CT 09/19/2022. FINDINGS: Brain: No acute hemorrhage. 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 No new loss of gray-white differentiation. No hydrocephalus or extra-axial collection. No mass effect or midline shift. Vascular: No hyperdense vessel or unexpected calcification. Skull: No calvarial fracture or suspicious bone lesion. Skull base is unremarkable. Sinuses/Orbits: No acute finding. Other: None. ASPECTS [facility] Stroke Program Early CT Score) - Ganglionic level infarction (caudate, lentiform nuclei, internal capsule, insula, M1-M3 cortex): 7 - Supraganglionic infarction (M4-M6 cortex): 3 Total score (0-10 with 10 being normal): 10 IMPRESSION: 1. No acute intracranial hemorrhage or evidence of acute large vessel territory infarct. ASPECT score is 10. 2. 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 Code stroke imaging results were communicated on 12/17/2022 at 6:06 pm to provider Dr. Selina Cooley via secure text paging. Electronically Signed   By: Orvan Falconer M.D.   On: 12/17/2022 18:06      Laketra Bowdish T. Shakeita Vandevander [facility]  If 7PM-7AM, please contact night-coverage [email or web address] 12/18/2022, 11:47 AM

## 2022-12-19 ENCOUNTER — Encounter (HOSPITAL_COMMUNITY): Admission: EM | Disposition: E | Payer: Self-pay | Source: Home / Self Care | Attending: Internal Medicine

## 2022-12-19 ENCOUNTER — Inpatient Hospital Stay (HOSPITAL_COMMUNITY): Payer: Medicaid Other | Admitting: Anesthesiology

## 2022-12-19 ENCOUNTER — Inpatient Hospital Stay (HOSPITAL_COMMUNITY): Payer: Medicaid Other

## 2022-12-19 DIAGNOSIS — Z87891 Personal history of nicotine dependence: Secondary | ICD-10-CM

## 2022-12-19 DIAGNOSIS — I651 Occlusion and stenosis of basilar artery: Secondary | ICD-10-CM

## 2022-12-19 DIAGNOSIS — E440 Moderate protein-calorie malnutrition: Secondary | ICD-10-CM | POA: Insufficient documentation

## 2022-12-19 DIAGNOSIS — I6389 Other cerebral infarction: Secondary | ICD-10-CM | POA: Diagnosis not present

## 2022-12-19 DIAGNOSIS — K7460 Unspecified cirrhosis of liver: Secondary | ICD-10-CM | POA: Diagnosis not present

## 2022-12-19 DIAGNOSIS — N1832 Chronic kidney disease, stage 3b: Secondary | ICD-10-CM | POA: Diagnosis not present

## 2022-12-19 DIAGNOSIS — I639 Cerebral infarction, unspecified: Secondary | ICD-10-CM

## 2022-12-19 DIAGNOSIS — I5022 Chronic systolic (congestive) heart failure: Secondary | ICD-10-CM

## 2022-12-19 DIAGNOSIS — I672 Cerebral atherosclerosis: Secondary | ICD-10-CM | POA: Diagnosis not present

## 2022-12-19 DIAGNOSIS — I6501 Occlusion and stenosis of right vertebral artery: Secondary | ICD-10-CM | POA: Diagnosis not present

## 2022-12-19 DIAGNOSIS — I6322 Cerebral infarction due to unspecified occlusion or stenosis of basilar arteries: Secondary | ICD-10-CM | POA: Diagnosis not present

## 2022-12-19 DIAGNOSIS — I6523 Occlusion and stenosis of bilateral carotid arteries: Secondary | ICD-10-CM | POA: Diagnosis not present

## 2022-12-19 DIAGNOSIS — I4819 Other persistent atrial fibrillation: Secondary | ICD-10-CM | POA: Diagnosis not present

## 2022-12-19 DIAGNOSIS — I130 Hypertensive heart and chronic kidney disease with heart failure and stage 1 through stage 4 chronic kidney disease, or unspecified chronic kidney disease: Secondary | ICD-10-CM | POA: Diagnosis not present

## 2022-12-19 DIAGNOSIS — E669 Obesity, unspecified: Secondary | ICD-10-CM | POA: Diagnosis not present

## 2022-12-19 DIAGNOSIS — I2510 Atherosclerotic heart disease of native coronary artery without angina pectoris: Secondary | ICD-10-CM | POA: Diagnosis not present

## 2022-12-19 DIAGNOSIS — I6381 Other cerebral infarction due to occlusion or stenosis of small artery: Secondary | ICD-10-CM | POA: Diagnosis not present

## 2022-12-19 DIAGNOSIS — Z4682 Encounter for fitting and adjustment of non-vascular catheter: Secondary | ICD-10-CM | POA: Diagnosis not present

## 2022-12-19 DIAGNOSIS — I10 Essential (primary) hypertension: Secondary | ICD-10-CM | POA: Diagnosis not present

## 2022-12-19 DIAGNOSIS — I6340 Cerebral infarction due to embolism of unspecified cerebral artery: Secondary | ICD-10-CM | POA: Diagnosis not present

## 2022-12-19 DIAGNOSIS — Z95810 Presence of automatic (implantable) cardiac defibrillator: Secondary | ICD-10-CM | POA: Diagnosis not present

## 2022-12-19 DIAGNOSIS — J4489 Other specified chronic obstructive pulmonary disease: Secondary | ICD-10-CM | POA: Diagnosis not present

## 2022-12-19 DIAGNOSIS — G4733 Obstructive sleep apnea (adult) (pediatric): Secondary | ICD-10-CM | POA: Diagnosis not present

## 2022-12-19 DIAGNOSIS — I4811 Longstanding persistent atrial fibrillation: Secondary | ICD-10-CM | POA: Diagnosis not present

## 2022-12-19 DIAGNOSIS — E785 Hyperlipidemia, unspecified: Secondary | ICD-10-CM | POA: Diagnosis not present

## 2022-12-19 HISTORY — PX: RADIOLOGY WITH ANESTHESIA: SHX6223

## 2022-12-19 LAB — CBC
HCT: 54.8 % — ABNORMAL HIGH (ref 39.0–52.0)
Hemoglobin: 19 g/dL — ABNORMAL HIGH (ref 13.0–17.0)
MCH: 31.4 pg (ref 26.0–34.0)
MCHC: 34.7 g/dL (ref 30.0–36.0)
MCV: 90.4 fL (ref 80.0–100.0)
Platelets: 195 10*3/uL (ref 150–400)
RBC: 6.06 MIL/uL — ABNORMAL HIGH (ref 4.22–5.81)
RDW: 13.5 % (ref 11.5–15.5)
WBC: 12.7 10*3/uL — ABNORMAL HIGH (ref 4.0–10.5)
nRBC: 0 % (ref 0.0–0.2)

## 2022-12-19 LAB — RENAL FUNCTION PANEL
Albumin: 3.8 g/dL (ref 3.5–5.0)
Anion gap: 13 (ref 5–15)
BUN: 19 mg/dL (ref 6–20)
CO2: 20 mmol/L — ABNORMAL LOW (ref 22–32)
Calcium: 9.4 mg/dL (ref 8.9–10.3)
Chloride: 108 mmol/L (ref 98–111)
Creatinine, Ser: 1.71 mg/dL — ABNORMAL HIGH (ref 0.61–1.24)
GFR, Estimated: 45 mL/min — ABNORMAL LOW (ref >60)
Glucose, Bld: 119 mg/dL — ABNORMAL HIGH (ref 70–99)
Phosphorus: 2.6 mg/dL (ref 2.5–4.6)
Potassium: 4.1 mmol/L (ref 3.5–5.1)
Sodium: 141 mmol/L (ref 135–145)

## 2022-12-19 LAB — ECHOCARDIOGRAM COMPLETE BUBBLE STUDY
S' Lateral: 5.4 cm
Single Plane A2C EF: 34.6 %

## 2022-12-19 LAB — VITAMIN B12: Vitamin B-12: 284 pg/mL (ref 180–914)

## 2022-12-19 LAB — BLOOD GAS, VENOUS
Acid-base deficit: 0.8 mmol/L (ref 0.0–2.0)
Bicarbonate: 20.9 mmol/L (ref 20.0–28.0)
Drawn by: 60076
O2 Saturation: 98.1 %
Patient temperature: 36.9
pCO2, Ven: 28 mmHg — ABNORMAL LOW (ref 44–60)
pH, Ven: 7.48 — ABNORMAL HIGH (ref 7.25–7.43)
pO2, Ven: 102 mmHg — ABNORMAL HIGH (ref 32–45)

## 2022-12-19 LAB — HEPARIN LEVEL (UNFRACTIONATED): Heparin Unfractionated: 0.58 [IU]/mL (ref 0.30–0.70)

## 2022-12-19 LAB — GLUCOSE, CAPILLARY
Glucose-Capillary: 152 mg/dL — ABNORMAL HIGH (ref 70–99)
Glucose-Capillary: 159 mg/dL — ABNORMAL HIGH (ref 70–99)

## 2022-12-19 LAB — AMMONIA: Ammonia: 32 umol/L (ref 9–35)

## 2022-12-19 LAB — TSH: TSH: 0.542 u[IU]/mL (ref 0.350–4.500)

## 2022-12-19 LAB — MAGNESIUM: Magnesium: 1.9 mg/dL (ref 1.7–2.4)

## 2022-12-19 SURGERY — RADIOLOGY WITH ANESTHESIA
Anesthesia: General

## 2022-12-19 MED ORDER — SUCCINYLCHOLINE CHLORIDE 200 MG/10ML IV SOSY
PREFILLED_SYRINGE | INTRAVENOUS | Status: DC | PRN
  Administered 2022-12-19: 120 mg via INTRAVENOUS

## 2022-12-19 MED ORDER — CLONIDINE HCL 0.1 MG/24HR TD PTWK
0.1000 mg | MEDICATED_PATCH | TRANSDERMAL | Status: DC
  Administered 2022-12-19: 0.1 mg via TRANSDERMAL
  Filled 2022-12-19: qty 1

## 2022-12-19 MED ORDER — ATORVASTATIN CALCIUM 10 MG PO TABS
20.0000 mg | ORAL_TABLET | Freq: Every day | ORAL | Status: DC
  Administered 2022-12-20 – 2022-12-22 (×3): 20 mg
  Filled 2022-12-19 (×3): qty 2

## 2022-12-19 MED ORDER — IOHEXOL 300 MG/ML  SOLN
100.0000 mL | Freq: Once | INTRAMUSCULAR | Status: AC | PRN
  Administered 2022-12-20: 10 mL via INTRA_ARTERIAL

## 2022-12-19 MED ORDER — ACETAMINOPHEN 325 MG PO TABS
650.0000 mg | ORAL_TABLET | Freq: Four times a day (QID) | ORAL | Status: DC | PRN
  Administered 2022-12-21 – 2022-12-22 (×4): 650 mg
  Filled 2022-12-19 (×4): qty 2

## 2022-12-19 MED ORDER — IOHEXOL 350 MG/ML SOLN
60.0000 mL | Freq: Once | INTRAVENOUS | Status: AC | PRN
  Administered 2022-12-19: 60 mL via INTRAVENOUS

## 2022-12-19 MED ORDER — ALPRAZOLAM 0.5 MG PO TABS: 0.5000 mg | ORAL_TABLET | Freq: Two times a day (BID) | ORAL | Status: DC | PRN

## 2022-12-19 MED ORDER — ONDANSETRON HCL 4 MG/2ML IJ SOLN
4.0000 mg | Freq: Four times a day (QID) | INTRAMUSCULAR | Status: DC | PRN
  Filled 2022-12-19: qty 2

## 2022-12-19 MED ORDER — ARFORMOTEROL TARTRATE 15 MCG/2ML IN NEBU
15.0000 ug | INHALATION_SOLUTION | Freq: Two times a day (BID) | RESPIRATORY_TRACT | Status: DC
  Administered 2022-12-19 – 2022-12-22 (×6): 15 ug via RESPIRATORY_TRACT
  Filled 2022-12-19 (×7): qty 2

## 2022-12-19 MED ORDER — CEFAZOLIN SODIUM-DEXTROSE 2-4 GM/100ML-% IV SOLN
INTRAVENOUS | Status: AC
  Filled 2022-12-19: qty 100

## 2022-12-19 MED ORDER — IOHEXOL 300 MG/ML  SOLN
150.0000 mL | Freq: Once | INTRAMUSCULAR | Status: AC | PRN
  Administered 2022-12-19: 100 mL via INTRA_ARTERIAL

## 2022-12-19 MED ORDER — DEXAMETHASONE SODIUM PHOSPHATE 10 MG/ML IJ SOLN
INTRAMUSCULAR | Status: DC | PRN
  Administered 2022-12-19: 10 mg via INTRAVENOUS

## 2022-12-19 MED ORDER — HYDRALAZINE HCL 50 MG PO TABS
50.0000 mg | ORAL_TABLET | Freq: Three times a day (TID) | ORAL | Status: DC
  Filled 2022-12-19: qty 1

## 2022-12-19 MED ORDER — CANGRELOR TETRASODIUM 50 MG IV SOLR
INTRAVENOUS | Status: AC
  Filled 2022-12-19: qty 50

## 2022-12-19 MED ORDER — PROSOURCE TF20 ENFIT COMPATIBL EN LIQD
60.0000 mL | Freq: Two times a day (BID) | ENTERAL | Status: DC
  Administered 2022-12-19 – 2022-12-22 (×6): 60 mL
  Filled 2022-12-19 (×6): qty 60

## 2022-12-19 MED ORDER — RIVAROXABAN 20 MG PO TABS
20.0000 mg | ORAL_TABLET | Freq: Every day | ORAL | Status: DC
  Administered 2022-12-19: 20 mg
  Filled 2022-12-19: qty 1

## 2022-12-19 MED ORDER — PHENYLEPHRINE 80 MCG/ML (10ML) SYRINGE FOR IV PUSH (FOR BLOOD PRESSURE SUPPORT)
PREFILLED_SYRINGE | INTRAVENOUS | Status: DC | PRN
  Administered 2022-12-19: 160 ug via INTRAVENOUS

## 2022-12-19 MED ORDER — LIDOCAINE 2% (20 MG/ML) 5 ML SYRINGE
INTRAMUSCULAR | Status: DC | PRN
  Administered 2022-12-19: 60 mg via INTRAVENOUS

## 2022-12-19 MED ORDER — HYDRALAZINE HCL 20 MG/ML IJ SOLN
20.0000 mg | Freq: Four times a day (QID) | INTRAMUSCULAR | Status: DC | PRN
  Administered 2022-12-19: 20 mg via INTRAVENOUS
  Filled 2022-12-19: qty 1

## 2022-12-19 MED ORDER — SACUBITRIL-VALSARTAN 24-26 MG PO TABS: 1.0000 | ORAL_TABLET | Freq: Two times a day (BID) | ORAL | Status: DC

## 2022-12-19 MED ORDER — PHENYLEPHRINE HCL-NACL 20-0.9 MG/250ML-% IV SOLN
INTRAVENOUS | Status: DC | PRN
  Administered 2022-12-19: 40 ug/min via INTRAVENOUS

## 2022-12-19 MED ORDER — LABETALOL HCL 5 MG/ML IV SOLN
20.0000 mg | INTRAVENOUS | Status: DC | PRN
  Administered 2022-12-19: 20 mg via INTRAVENOUS
  Filled 2022-12-19: qty 4

## 2022-12-19 MED ORDER — ONDANSETRON HCL 4 MG PO TABS: 4.0000 mg | ORAL_TABLET | Freq: Four times a day (QID) | ORAL | Status: DC | PRN

## 2022-12-19 MED ORDER — SACUBITRIL-VALSARTAN 24-26 MG PO TABS
1.0000 | ORAL_TABLET | Freq: Two times a day (BID) | ORAL | Status: DC
  Administered 2022-12-19: 1
  Filled 2022-12-19 (×2): qty 1

## 2022-12-19 MED ORDER — VASOPRESSIN 20 UNIT/ML IV SOLN
INTRAVENOUS | Status: DC | PRN
  Administered 2022-12-19 (×2): 2 [IU] via INTRAVENOUS

## 2022-12-19 MED ORDER — REVEFENACIN 175 MCG/3ML IN SOLN
175.0000 ug | Freq: Every day | RESPIRATORY_TRACT | Status: DC
  Administered 2022-12-20 – 2022-12-22 (×3): 175 ug via RESPIRATORY_TRACT
  Filled 2022-12-19 (×3): qty 3

## 2022-12-19 MED ORDER — MELATONIN 5 MG PO TABS: 10.0000 mg | ORAL_TABLET | Freq: Every evening | ORAL | Status: DC | PRN

## 2022-12-19 MED ORDER — FENTANYL CITRATE (PF) 250 MCG/5ML IJ SOLN
INTRAMUSCULAR | Status: DC | PRN
  Administered 2022-12-19 – 2022-12-20 (×5): 50 ug via INTRAVENOUS

## 2022-12-19 MED ORDER — CEFAZOLIN SODIUM-DEXTROSE 2-3 GM-%(50ML) IV SOLR
INTRAVENOUS | Status: DC | PRN
  Administered 2022-12-19: 2 g via INTRAVENOUS

## 2022-12-19 MED ORDER — BUDESONIDE 0.5 MG/2ML IN SUSP
0.5000 mg | Freq: Two times a day (BID) | RESPIRATORY_TRACT | Status: DC
  Administered 2022-12-19 – 2022-12-22 (×6): 0.5 mg via RESPIRATORY_TRACT
  Filled 2022-12-19 (×7): qty 2

## 2022-12-19 MED ORDER — JEVITY 1.5 CAL/FIBER PO LIQD
1000.0000 mL | ORAL | Status: DC
  Administered 2022-12-19 – 2022-12-20 (×2): 1000 mL
  Filled 2022-12-19 (×7): qty 1000

## 2022-12-19 MED ORDER — HYDRALAZINE HCL 20 MG/ML IJ SOLN: 20.0000 mg | INTRAMUSCULAR | Status: DC | PRN

## 2022-12-19 MED ORDER — IPRATROPIUM-ALBUTEROL 0.5-2.5 (3) MG/3ML IN SOLN: 3.0000 mL | Freq: Four times a day (QID) | RESPIRATORY_TRACT | Status: DC | PRN

## 2022-12-19 MED ORDER — CARVEDILOL 12.5 MG PO TABS
12.5000 mg | ORAL_TABLET | Freq: Two times a day (BID) | ORAL | Status: DC
  Administered 2022-12-19: 12.5 mg
  Filled 2022-12-19: qty 1

## 2022-12-19 MED ORDER — ETOMIDATE 2 MG/ML IV SOLN
INTRAVENOUS | Status: DC | PRN
Start: 2022-12-19 — End: 2022-12-20
  Administered 2022-12-19: 10 mg via INTRAVENOUS

## 2022-12-19 MED ORDER — FENTANYL CITRATE (PF) 250 MCG/5ML IJ SOLN
INTRAMUSCULAR | Status: AC
  Filled 2022-12-19: qty 5

## 2022-12-19 MED ORDER — HYDRALAZINE HCL 50 MG PO TABS
50.0000 mg | ORAL_TABLET | Freq: Three times a day (TID) | ORAL | Status: DC
  Administered 2022-12-19: 50 mg
  Filled 2022-12-19: qty 1

## 2022-12-19 MED ORDER — ACETAMINOPHEN 650 MG RE SUPP: 650.0000 mg | Freq: Four times a day (QID) | RECTAL | Status: DC | PRN

## 2022-12-19 MED ORDER — DOXAZOSIN MESYLATE 2 MG PO TABS
2.0000 mg | ORAL_TABLET | Freq: Every day | ORAL | Status: DC
  Administered 2022-12-19 – 2022-12-22 (×3): 2 mg
  Filled 2022-12-19 (×4): qty 1

## 2022-12-19 MED ORDER — ROCURONIUM BROMIDE 10 MG/ML (PF) SYRINGE
PREFILLED_SYRINGE | INTRAVENOUS | Status: DC | PRN
  Administered 2022-12-19: 30 mg via INTRAVENOUS
  Administered 2022-12-19: 50 mg via INTRAVENOUS

## 2022-12-19 MED ORDER — METOPROLOL TARTRATE 5 MG/5ML IV SOLN
5.0000 mg | Freq: Four times a day (QID) | INTRAVENOUS | Status: DC
  Administered 2022-12-19: 5 mg via INTRAVENOUS
  Filled 2022-12-19: qty 5

## 2022-12-19 MED ORDER — EZETIMIBE 10 MG PO TABS
10.0000 mg | ORAL_TABLET | Freq: Every day | ORAL | Status: DC
  Administered 2022-12-20 – 2022-12-22 (×3): 10 mg
  Filled 2022-12-19 (×3): qty 1

## 2022-12-19 MED ORDER — NITROGLYCERIN IN D5W 200-5 MCG/ML-% IV SOLN: 0.0000 ug/min | INTRAVENOUS | Status: DC

## 2022-12-19 MED ORDER — AMIODARONE HCL 200 MG PO TABS
200.0000 mg | ORAL_TABLET | Freq: Every day | ORAL | Status: DC
  Administered 2022-12-19 – 2022-12-22 (×4): 200 mg
  Filled 2022-12-19 (×4): qty 1

## 2022-12-19 MED ORDER — ASPIRIN 81 MG PO CHEW
81.0000 mg | CHEWABLE_TABLET | Freq: Every day | ORAL | Status: DC
  Administered 2022-12-19 – 2022-12-22 (×4): 81 mg
  Filled 2022-12-19 (×4): qty 1

## 2022-12-19 MED ORDER — NOREPINEPHRINE 4 MG/250ML-% IV SOLN
INTRAVENOUS | Status: DC | PRN
Start: 2022-12-19 — End: 2022-12-20
  Administered 2022-12-19: 2 ug/min via INTRAVENOUS

## 2022-12-19 MED ORDER — LACTATED RINGERS IV SOLN: INTRAVENOUS | Status: DC | PRN

## 2022-12-19 NOTE — Progress Notes (Signed)
ANTICOAGULATION CONSULT NOTE  Pharmacy Consult for Xarelto Indication: atrial fibrillation  Allergies  Allergen Reactions   Prochlorperazine Other (See Comments)    Medication caused a lot of issues like blurred vision, nausea, pain, and carpool turnel   Codeine Itching, Nausea And Vomiting and Hives   Hydrocodone-Acetaminophen Itching and Nausea And Vomiting    Patient Measurements: Height: 6\' 3"  (190.5 cm) Weight: 110.3 kg (243 lb 2.7 oz) IBW/kg (Calculated) : 84.5 Heparin Dosing Weight: 108 kg   Vital Signs: Temp: 98.1 F (36.7 C) (08/19 1606) Temp Source: Oral (08/19 1606) BP: 201/124 (08/19 1606) Pulse Rate: 98 (08/19 1606)  Labs: Recent Labs    12/17/22 1748 12/17/22 1753 12/18/22 0730 12/18/22 1548 12/19/22 0504 12/19/22 0846 12/19/22 1106  HGB 16.9 17.7*  --   --   --  19.0*  --   HCT 50.5 52.0  --   --   --  54.8*  --   PLT 170  --   --   --   --  195  --   APTT 26  --  38*  --   --   --   --   LABPROT 13.6  --   --   --   --   --   --   INR 1.0  --   --   --   --   --   --   HEPARINUNFRC  --   --  0.12* 0.35 0.58  --   --   CREATININE 1.92* 1.90* 1.79*  --   --   --  1.71*    Estimated Creatinine Clearance: 61.6 mL/min (A) (by C-G formula based on SCr of 1.71 mg/dL (H)).  Assessment: 24 yom presenting with weakness/dysarthria, CT showing no acute infarcts but old infarct - on Xarelto PTA for hx Afib (LD 8/16 PM). Did not pass bedside swallow exam so will bridge with heparin for now.   Cortrak placed and confirmed with CXR. Transitioning IV heparin to Xarelto.  Goal of Therapy:  Heparin level 0.3-0.5 units/ml Monitor platelets by anticoagulation protocol: Yes   Plan:  Stop heparin infusion Resume Xarelto 20mg  by tube daily Continue to monitor H&H and platelets   Thank you for allowing pharmacy to be a part of this patient's care.  Thelma Barge, PharmD Clinical Pharmacist

## 2022-12-19 NOTE — Progress Notes (Addendum)
Neurology Progress Note   S:// Asked by Dr. Roda Shutters to check on the patient after his imaging studies were completed. Patient extremely lethargic, unable to follow commands consistently. 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    O:// Current vital signs: BP (!) 170/111 (BP Location: Left Arm)   Pulse 91   Temp 98.3 F (36.8 C) (Oral)   Resp 18   Ht 6\' 3"  (1.905 m)   Wt 110.3 kg   SpO2 96%   BMI 30.39 kg/m  Vital signs in last 24 hours: Temp:  [98.1 F (36.7 C)-99.3 F (37.4 C)] 98.3 F (36.8 C) (08/19 2027) Pulse Rate:  [83-98] 91 (08/19 2027) Resp:  [17-19] 18 (08/19 2027) BP: (170-224)/(88-136) 170/111 (08/19 2027) SpO2:  [96 %-98 %] 96 % (08/19 2130) Weight:  [110.3 kg] 110.3 kg (08/19 0540) GEN: Extremely drowsy, opens eyes to noxious stimulation but keeps falling asleep HEENT: Normocephalic atraumatic Lungs: Gurgling sounds Abdomen nondistended nontender.  Core track in place. Neurological exam Extremely drowsy, opens eyes to noxious stimulation but keeps falling asleep during conversation. Does not follow commands consistently Nonverbal Upon coaching to have him raises arms and legs antigravity, he just nodded no. Cranial nerves: Pupils are equal round reactive to light, extraocular movements are hard to assess due to his inability to follow commands but he has positive oculocephalic reflexes, blinks to threat inconsistently from both sides, face appears grossly symmetric. Motor examination with no movement in any of the 4 extremities although he does try and has some muscle activation in bilateral lower extremities but barely 1/5 in all fours. Sensation: Grimace to noxious stimulation in all fours Coordination difficult to assess NIH stroke scale-22  Medications  Current Facility-Administered Medications:    acetaminophen (TYLENOL) tablet 650 mg, 650  mg, Per Tube, Q6H PRN **OR** acetaminophen (TYLENOL) suppository 650 mg, 650 mg, Rectal, Q6H PRN, Gonfa, Taye T, MD   ALPRAZolam (XANAX) tablet 0.5 mg, 0.5 mg, Per Tube, BID PRN, Alanda Slim, Taye T, MD   amiodarone (PACERONE) tablet 200 mg, 200 mg, Per Tube, Daily, Gonfa, Taye T, MD, 200 mg at 12/19/22 1727   arformoterol (BROVANA) nebulizer solution 15 mcg, 15 mcg, Nebulization, BID, Alanda Slim, Taye T, MD, 15 mcg at 12/19/22 2130   aspirin chewable tablet 81 mg, 81 mg, Per Tube, Daily, Gonfa, Taye T, MD, 81 mg at 12/19/22 1641   [START ON 12/20/2022] atorvastatin (LIPITOR) tablet 20 mg, 20 mg, Per Tube, Daily, Gonfa, Taye T, MD   budesonide (PULMICORT) nebulizer solution 0.5 mg, 0.5 mg, Nebulization, BID, Gonfa, Taye T, MD, 0.5 mg at 12/19/22 2130   carvedilol (COREG) tablet 12.5 mg, 12.5 mg, Per Tube, BID WC, Gonfa, Taye T, MD, 12.5 mg at 12/19/22 1640   doxazosin (CARDURA) tablet 2 mg, 2 mg, Per Tube, Daily, Gonfa, Taye T, MD, 2 mg at 12/19/22 2046   [START ON 12/20/2022] ezetimibe (ZETIA) tablet 10 mg, 10 mg, Per Tube, Daily, Gonfa, Taye T, MD   feeding supplement (JEVITY 1.5 CAL/FIBER) liquid 1,000 mL, 1,000 mL, Per Tube, Continuous, Gonfa, Taye T, MD, Last Rate: 60 mL/hr at 12/19/22 1648, 1,000 mL at 12/19/22 1648   feeding supplement (PROSource TF20) liquid 60 mL, 60 mL, Per Tube, BID, Gonfa, Taye T, MD, 60 mL at 12/19/22 2049   hydrALAZINE (APRESOLINE) tablet 50 mg, 50 mg, Per Tube, Q8H, Gonfa, Taye T, MD, 50 mg at 12/19/22  2046   ipratropium-albuterol (DUONEB) 0.5-2.5 (3) MG/3ML nebulizer solution 3 mL, 3 mL, Nebulization, Q6H PRN, Alanda Slim, Taye T, MD   labetalol (NORMODYNE) injection 20 mg, 20 mg, Intravenous, Q2H PRN, Alanda Slim, Taye T, MD, 20 mg at 12/19/22 1230   melatonin tablet 10 mg, 10 mg, Per Tube, QHS PRN, Alanda Slim, Taye T, MD   ondansetron (ZOFRAN) tablet 4 mg, 4 mg, Per Tube, Q6H PRN **OR** ondansetron (ZOFRAN) injection 4 mg, 4 mg, Intravenous, Q6H PRN, Gonfa, Taye T, MD   revefenacin (YUPELRI)  nebulizer solution 175 mcg, 175 mcg, Nebulization, Daily, Gonfa, Taye T, MD   rivaroxaban (XARELTO) tablet 20 mg, 20 mg, Per Tube, Q supper, Candelaria Stagers T, MD, 20 mg at 12/19/22 1727   sacubitril-valsartan (ENTRESTO) 24-26 mg per tablet, 1 tablet, Per Tube, BID, Candelaria Stagers T, MD, 1 tablet at 12/19/22 1727  Labs CBC    Component Value Date/Time   WBC 12.7 (H) 12/19/2022 0846   RBC 6.06 (H) 12/19/2022 0846   HGB 19.0 (H) 12/19/2022 0846   HGB 16.0 06/02/2022 1426   HCT 54.8 (H) 12/19/2022 0846   HCT 46.7 06/02/2022 1426   PLT 195 12/19/2022 0846   PLT 185 06/02/2022 1426   MCV 90.4 12/19/2022 0846   MCV 91 06/02/2022 1426   MCH 31.4 12/19/2022 0846   MCHC 34.7 12/19/2022 0846   RDW 13.5 12/19/2022 0846   RDW 15.1 06/02/2022 1426   LYMPHSABS 1.1 12/17/2022 1748   LYMPHSABS 0.8 10/15/2020 1118   MONOABS 0.6 12/17/2022 1748   EOSABS 0.1 12/17/2022 1748   EOSABS 0.1 10/15/2020 1118   BASOSABS 0.1 12/17/2022 1748   BASOSABS 0.0 10/15/2020 1118    CMP     Component Value Date/Time   NA 141 12/19/2022 1106   NA 133 (L) 06/02/2022 1426   K 4.1 12/19/2022 1106   CL 108 12/19/2022 1106   CO2 20 (L) 12/19/2022 1106   GLUCOSE 119 (H) 12/19/2022 1106   BUN 19 12/19/2022 1106   BUN 26 (H) 06/02/2022 1426   CREATININE 1.71 (H) 12/19/2022 1106   CREATININE 1.38 (H) 02/08/2016 1155   CALCIUM 9.4 12/19/2022 1106   PROT 6.8 12/17/2022 1748   PROT 6.9 12/09/2020 1458   ALBUMIN 3.8 12/19/2022 1106   ALBUMIN 4.2 12/09/2020 1458   AST 14 (L) 12/17/2022 1748   ALT 13 12/17/2022 1748   ALKPHOS 57 12/17/2022 1748   BILITOT 0.9 12/17/2022 1748   BILITOT 0.4 12/09/2020 1458   GFRNONAA 45 (L) 12/19/2022 1106   GFRAA 36 (L) 06/11/2020 1529    Lipid Panel     Component Value Date/Time   CHOL 136 12/18/2022 0114   CHOL 196 05/08/2020 1332   TRIG 59 12/18/2022 0114   HDL 36 (L) 12/18/2022 0114   HDL 33 (L) 05/08/2020 1332   CHOLHDL 3.8 12/18/2022 0114   VLDL 12 12/18/2022 0114    LDLCALC 88 12/18/2022 0114   LDLCALC 138 (H) 05/08/2020 1332   LDLDIRECT 102 (H) 09/15/2020 1635    Lab Results  Component Value Date   HGBA1C 5.5 12/18/2022     Imaging I have reviewed images in epic and the results pertinent to this consultation are: CTA head and neck: No bleed on the noncontrasted head CT. Evaluation of the vessels significantly limited by bolus timing and motion.  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  75% stenosis of the proximal right ICA  with additional 50% stenosis in the more distal right ICA.  60% stenosis of the proximal to mid left ICA.  Severe stenosis at the origin of the right vert with moderate stenosis in the right V2 segment.  Assessment:  Patient with prior history of a brainstem stroke with residual left-sided weakness and mild slurred speech brought in for evaluation of sudden onset of worsening of left-sided weakness and slurred speech.  Exam has been gradually worsening over the last 2 days.  CT angiography was obtained today-not done prior due to deranged renal function, concerning for basilar occlusion. Repeat exam with patient with inability to follow commands, extremely lethargic and opening eyes to noxious stimulation only.  Not moving any of the 4 extremities. 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 Significant other listed as emergency contact upon EMR contacted.  Case discussed in detail with Dr. Gaye Alken and Dr. Roda Shutters and then with the patient's significant other and Dr. Sherlon Handing on a three-way call.  Risks benefits and alternatives of proceeding with an IR procedure for revascularization of the basilar discussed.  Clearly described that this is beyond the norm for acute stroke treatment as he is at least 2 maybe 3 days from last known well but an acute basilar occlusion could be  life-threatening.  Alternatives of waiting and watching and possibility of large brain strength stroke involving with him remaining in locked-in state also discussed with the family. After detailed discussion of a good 20 to 30 minutes, they decided to proceed with clear understanding that he is at very high risk of up to 50% of chance of hemorrhage with revascularization-but there thought process was that not doing anything is very likely going to leave him extremely disabled, which is something that he would not have wanted.   Recommendations: Acute IR code stroke activated Emergent diagnostic cerebral angiogram followed by possible vascular revascularization-defer to IR. Transfer the patient to neuro ICU under the stroke attending service for now. Post thrombectomy care per IR. Once the patient is ready to be discharged off the ICU, he can be transferred back to the hospitalist service Stroke neurology will continue to follow Plan was discussed with Dr. Roda Shutters, Dr. Ouida Sills and the patient's significant other Ms. Gaetano Hawthorne at [phone].  All questions were answered. Imaging was reviewed personally by me.  Imaging was also discussed with the interventionalist.  Risks benefits and alternatives of the procedure as well as extremely high risk of poor outcome in terms of reperfusion hemorrhage or worsening stroke were discussed with the family and they agreed to proceed.  Plan was also relayed to the covering hospitalist via phone  -- Milon Dikes, MD Neurologist [facility] Pager: [phone]  CRITICAL CARE ATTESTATION Performed by: Milon Dikes, MD Total critical care time: 65 minutes Critical care time was exclusive of separately billable procedures and treating other patients and/or supervising APPs/Residents/Students Critical care was necessary to treat or prevent imminent or life-threatening deterioration. This patient is critically ill and at significant  risk for neurological worsening and/or death and care requires constant monitoring. Critical care was time spent personally by me on the following activities: development of treatment plan with patient and/or surrogate as well as nursing, discussions with consultants, evaluation of patient's response to treatment, examination of patient, obtaining history from patient or surrogate, ordering and performing treatments and interventions, ordering and review of laboratory studies, ordering and review of radiographic studies, pulse oximetry, re-evaluation of patient's  condition, participation in multidisciplinary rounds and medical decision making of high complexity in the care of this patient.

## 2022-12-19 NOTE — Anesthesia Procedure Notes (Signed)
Arterial Line Insertion Start/End8/19/2024 10:15 PM, 12/19/2022 10:18 PM Performed by: Tressia Miners, CRNA, CRNA  Patient location: OOR procedure area. Emergency situation Left, radial was placed Catheter size: 20 G Hand hygiene performed , maximum sterile barriers used  and Seldinger technique used Allen's test indicative of satisfactory collateral circulation Attempts: 1 Procedure performed without using ultrasound guided technique. Following insertion, dressing applied and Biopatch. Post procedure assessment: normal and unchanged  Patient tolerated the procedure well with no immediate complications.

## 2022-12-19 NOTE — Code Documentation (Signed)
Responded to Code IR paged out at 2139 d/t worsening neuro exam, NIH-22, LSN->24 hours ago. Pt transported to IR with 3W RN and RRT. Pt arrived in IR suite at 2156.

## 2022-12-19 NOTE — Progress Notes (Signed)
Initial Nutrition Assessment  DOCUMENTATION CODES:   Non-severe (moderate) malnutrition in context of chronic illness  INTERVENTION:   Once gastric Cortrak tube placement is confirmed via x-ray, initiate enteral nutrition: - Start Jevity 1.5 @ 30 ml/hr and advance rate by 10 ml every 6 hours to goal rate of 60 ml/hr (1440 ml/day) - PROSource TF20 60 ml BID  Tube feeding regimen at goal rate provides 2320 kcal, 132 grams of protein, and 1094 ml of H2O.  NUTRITION DIAGNOSIS:   Moderate Malnutrition related to chronic illness (CHF, CKD IIIb, COPD, cirrhosis) as evidenced by mild muscle depletion, percent weight loss (11.5% weight loss in 3 months).  GOAL:   Patient will meet greater than or equal to 90% of their needs  MONITOR:   Diet advancement, Labs, Weight trends, TF tolerance  REASON FOR ASSESSMENT:   Consult Enteral/tube feeding initiation and management  ASSESSMENT:   62 year old male who presented to the ED on 8/17 with left-sided weakness. 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 Pt admitted with CVA.  08/18 - SLP recommending NPO 08/19 - Cortrak placed (x-ray pending)  Consult received for enteral nutrition initiation and management. SLP continues to recommend NPO. Cortrak placed today.  Spoke with significant other at bedside. She reports that pt has a normal appetite but lately has been eating less. She shares that pt typically eats 1 full meal daily, usually between 6-7 pm. Pt typically goes to sleep between 5-6 am and wakes up between 4-5 pm.  Pt's significant other shares that pt really loves "sweets and junk food" and mostly drinks Coke. Pt does not drink a lot of water. For pt's one meal of the day, he usual eats fast food. For the rest of the time he is awake, he snacks on sweets.  Pt's significant other reports that pt has lost some  weight recently due to "not eating as much." Pt's UBW is 300 lbs. He weighed 278 lbs at the doctor's office a few weeks ago. Reviewed weight history in chart. Pt with a 14.4 kg weight loss since 09/19/22. This is an 11.5% weight loss in 3 months which is severe and significant for timeframe. Some of weight loss may be attributable to volume status given CHF and cirrhosis diagnoses; however, suspect true dry weight loss is present because weight has steadily been trending down.  Pt's significant other denies pt experiencing any issues with chewing or swallowing PTA. She does report that pt has constipation and takes miralax and stool softeners which provide some relief. She thinks he is constipated because of all the sweets that he consumes.  Based on weight loss and NFPE, pt meets criteria for moderate chronic malnutrition. Will start tube feeds at low rate and slowly advance to goal. RN aware.  Medications reviewed and include: heparin gtt  Labs reviewed: creatinine 1.71, ionized calcium 1.00, HDL 36, WBC 12.7, hemoglobin A1C 5.5 CBG's: 113  NUTRITION - FOCUSED PHYSICAL EXAM:  Flowsheet Row Most Recent Value  Orbital Region No depletion  Upper Arm Region No depletion  Thoracic and Lumbar Region No depletion  Buccal Region Moderate depletion  Temple Region No depletion  Clavicle Bone Region Mild depletion  Clavicle and Acromion Bone Region Mild depletion  Scapular Bone Region No depletion  Dorsal Hand No depletion  Patellar Region Mild depletion  Anterior Thigh Region Mild depletion  Posterior Calf Region Mild depletion  Edema (RD  Assessment) None  Hair Reviewed  Eyes Reviewed  Mouth Reviewed  Skin Reviewed  Nails Reviewed    Diet Order:   Diet Order             Diet NPO time specified  Diet effective now                   EDUCATION NEEDS:   Education needs have been addressed  Skin:  Skin Assessment: Reviewed RN Assessment  Last BM:  12/17/22  Height:   Ht  Readings from Last 1 Encounters:  12/17/22 6\' 3"  (1.905 m)    Weight:   Wt Readings from Last 1 Encounters:  12/19/22 110.3 kg    Ideal Body Weight:  89.1 kg  BMI:  Body mass index is 30.39 kg/m.  Estimated Nutritional Needs:   Kcal:  2300-2500  Protein:  120-135 grams  Fluid:  >2.0 L    Mertie Clause, MS, RD, LDN Registered Dietitian II Please see AMiON for contact information.

## 2022-12-19 NOTE — Anesthesia Preprocedure Evaluation (Addendum)
Anesthesia Evaluation  Patient identified by MRN, date of birth, ID band Patient unresponsive  Preop documentation limited or incomplete due to emergent nature of procedure.  Airway Mallampati: Unable to assess  TM Distance: >3 FB     Dental   Pulmonary asthma , COPD, former smoker   + rhonchi  + decreased breath sounds      Cardiovascular hypertension, + CAD and +CHF  + Cardiac Defibrillator  Rhythm:Regular Rate:Tachycardia     Neuro/Psych CVA    GI/Hepatic ,GERD  ,,  Endo/Other    Renal/GU      Musculoskeletal   Abdominal   Peds  Hematology   Anesthesia Other Findings   Reproductive/Obstetrics                             Anesthesia Physical Anesthesia Plan  ASA: 4 and emergent  Anesthesia Plan: General   Post-op Pain Management:    Induction: Intravenous  PONV Risk Score and Plan: 2 and Treatment may vary due to age or medical condition  Airway Management Planned: Oral ETT  Additional Equipment: Arterial line  Intra-op Plan:   Post-operative Plan: Post-operative intubation/ventilation  Informed Consent: I have reviewed the patients History and Physical, chart, labs and discussed the procedure including the risks, benefits and alternatives for the proposed anesthesia with the patient or authorized representative who has indicated his/her understanding and acceptance.       Plan Discussed with: CRNA  Anesthesia Plan Comments:        Anesthesia Quick Evaluation

## 2022-12-19 NOTE — Progress Notes (Signed)
OT Cancellation Note  Patient Details Name: Cody Ortega MRN: [id number] DOB: [birth year]   Cancelled Treatment:    Reason Eval/Treat Not Completed: Patient not medically ready (BP 207/113 RN request to hold at this time pending stablization of BP)  Mateo Flow 12/19/2022, 11:11 AM

## 2022-12-19 NOTE — Sedation Documentation (Signed)
Pt under care of anesthesia. Please see charting/vitals according to the CRNA

## 2022-12-19 NOTE — Anesthesia Procedure Notes (Addendum)
Procedure Name: Intubation Date/Time: 12/19/2022 10:08 PM  Performed by: Tressia Miners, CRNAPre-anesthesia Checklist: Patient identified, Emergency Drugs available, Suction available, Patient being monitored and Timeout performed Patient Re-evaluated:Patient Re-evaluated prior to induction Oxygen Delivery Method: Circle system utilized Preoxygenation: Pre-oxygenation with 100% oxygen Induction Type: IV induction, Rapid sequence and Cricoid Pressure applied Laryngoscope Size: Mac and 4 Grade View: Grade I Tube type: Oral Tube size: 7.5 mm Number of attempts: 1 Airway Equipment and Method: Stylet Placement Confirmation: ETT inserted through vocal cords under direct vision, positive ETCO2 and breath sounds checked- equal and bilateral Secured at: 23 cm Tube secured with: Tape Dental Injury: Teeth and Oropharynx as per pre-operative assessment  Comments: Smooth IV Induction. Eyes taped. RSI Performed. DL x 1 with grade 1 view. Atraumatically placed, teeth and lip remain intact as pre-op. Secured with tape. Bilateral breath sounds +/=, EtCO2 +, Adequate TV, VSS.

## 2022-12-19 NOTE — Procedures (Signed)
Cortrak  Person Inserting Tube:  Mahala Menghini, RD Tube Type:  Cortrak - 43 inches Tube Size:  10 Tube Location:  Right nare Secured by: Bridle Technique Used to Measure Tube Placement:  Marking at nare/corner of mouth Cortrak Secured At:  70 cm   Cortrak Tube Team Note:  Consult received to place a Cortrak feeding tube.   X-ray is required, abdominal x-ray has been ordered by the Cortrak team. Please confirm tube placement before using the Cortrak tube.   If the tube becomes dislodged please keep the tube and contact the Cortrak team at [email or web address] for replacement.  If after hours and replacement cannot be delayed, place a NG tube and confirm placement with an abdominal x-ray.    Mertie Clause, MS, RD, LDN Registered Dietitian II Please see AMiON for contact information.

## 2022-12-19 NOTE — Progress Notes (Signed)
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  Initially refused to come to the hospital but agreed when he continued to have weakness and dysarthria.  Was not a candidate for TNKase since he was outside the window and he is also on Xarelto.  CT head and CT C-spine without acute finding.  AICD not compatible with MRI.  Neurology consulted and recommended repeat CT head 12 hours later.  Failed swallowing eval and started on IV heparin.  Markedly elevated BP.  Worsening dysarthria and weakness in all extremities.  Also more encephalopathy.  BP remains elevated despite as needed meds.  Plan for cortrack to administer medication and nutrition.  Neurology ordered CT angio.  His renal function is better.   Subjective: Seen and examined earlier this morning.  As needed blood pressure medication adjusted overnight.  BP remains elevated.  Appears to be weaker in all extremities.  Seems to have worsening dysarthria.  Discussed about cortrack to be able to administer medications and nutrition.   Objective: Vitals:   12/19/22 1100 12/19/22 1131 12/19/22 1229 12/19/22 1400  BP: (!) 193/91 (!) 204/135 (!) 188/99 (!) 181/104  Pulse:  91    Resp:  19    Temp:  98.4 F (36.9 C)    TempSrc:  Axillary    SpO2:  98%    Weight:      Height:        Examination:  GENERAL: No apparent distress.  Nontoxic. HEENT: MMM.  Vision and hearing grossly intact.  NECK: Supple.  No apparent JVD.  RESP:  No IWOB.  Fair aeration  bilaterally. CVS:  RRR. Heart sounds normal.  ABD/GI/GU: BS+. Abd soft, NTND.  MSK/EXT:  Moves extremities. No apparent deformity. No edema.  SKIN: no apparent skin lesion or wound NEURO: Sleepy but wakes to voice.  Difficult to assess orientation due to severe dysarthria.  He is also not alert.  Follows some commands but very weak in all extremities.  Motor 3/5 in all extremities.  Patellar reflex symmetric.   PSYCH: Calm. Normal affect.   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  CT head and CT cervical spine without acute finding.  Patient has AICD that is not compatible with MRI.  AICD interrogated and showed A-fib but no significant RVR.  Has severe dysarthria and weakness in all  extremities on exam.  Markedly elevated BP.  He was unable to get his BP meds due to severe dysphagia.  He failed swallow eval.  UDS positive for THC and benzo.  Patient reports taking his Xarelto religiously.  LDL 88.  A1c 5.5.  TTE with LVEF of 20 to 25%, GH, moderate LVH, indeterminate DD, moderate LAE.  Carotid Doppler with 40 to 59% ICA stenosis bilaterally. -Neurology on board-planning CT angio head and neck now creatinine improved -IV labetalol 20 mg every 2 hours as needed for BP > 180/105 -IV hydralazine 20 mg every 4 hours as needed for  refractory BP> 180/105 after IV labetalol -Clonidine patch 0.1 mg -Cortrack for medication administration and feeding. -Continue low-dose aspirin and IV heparin for anticoagulation -PT/OT/SLP -Aspiration and fall precautions  Acute encephalopathy: Due to CVA?  Not on sedating medication -CVA workup and intervention as above -Check basic encephalopathy labs including VBG, TSH, B12 and RPR -May worth checking EEG  Chronic systolic CHF s/p AICD: TTE with LVEF of 20 to 25% (previously 30 to 35% in 2022), GH, moderate LVH, indeterminate DD, moderate LAE. Appears euvolemic on exam.  On Lasix 20 mg daily.  Home GDMT includes Coreg, Entresto and [place].  No respiratory distress or oxygen requirement. -Closely monitor respiratory and fluid status while on IV fluid due to n.p.o. -Continue home meds when able to take p.o.  Chronic atrial fibrillation: Rate controlled.  On amiodarone, Coreg and Xarelto at home.  Reports compliance. -IV heparin while n.p.o. -Change IV hydralazine to IV labetalol  Polycythemia: Due to dehydration?  Does not seem to have prior history of this.  Baseline Hgb ranges from 15-16 with HCT of 46-50.  Has history of OSA, noncompliant with CPAP.. -Continue monitoring  CAD: No cardiopulmonary symptoms. -Continue home meds after cortrack  CKD-3B: Baseline Cr ~ranges from 2-2.4.  Cr seem to be better than baseline. Recent Labs    02/04/22 1403 06/02/22 1426 08/10/22 1430 09/19/22 0313 12/17/22 1748 12/17/22 1753 12/18/22 0730 12/19/22 1106  BUN 34* 26* 26* 30* 19 30* 18 19  CREATININE 2.47* 2.52* 2.49* 2.43* 1.92* 1.90* 1.79* 1.71*  -Continue monitoring  Fall at home: Likely due to stroke.  He is also at risk for polypharmacy. -Fall precaution -PT/OT   COPD/asthma/OSA: Stable -Check VBG -Change inhalers to nebulizers given his mental status.   HLD: LDL 88. -Home Lipitor when able to take p.o.   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 -Encourage lifestyle change to lose weight Nutrition Problem: Moderate Malnutrition Etiology: chronic illness (CHF, CKD IIIb, COPD, cirrhosis) Signs/Symptoms: mild muscle depletion, percent weight loss (11.5% weight loss in 3 months) Percent weight loss: 11.5 % Interventions: Tube feeding   DVT prophylaxis:  SCDs Start: 12/17/22 2021 On full dose anticoagulation Code Status: Full code Family Communication: Updated patient's significant other at bedside Level of care: Progressive Status is: Inpatient The patient will remain inpatient because: Acute CVA with significant dysarthria, uncontrolled hypertension concerning for hypertensive emergency and encephalopathy   Final disposition: Home Consultants:  Neurology  55 minutes with more than 50% spent in reviewing records, counseling patient/family and coordinating care.   Sch Meds:  Scheduled Meds:  amiodarone  200 mg Per Tube Daily   arformoterol  15 mcg Nebulization BID   aspirin  81 mg Per Tube Daily   atorvastatin  20 mg Oral Daily   budesonide (PULMICORT)  nebulizer solution  0.5 mg Nebulization BID   carvedilol  12.5 mg Per Tube BID WC   entecavir  0.5 mg Oral QODAY   ezetimibe  10 mg Oral Daily   feeding supplement (PROSource TF20)  60 mL Per Tube BID   hydrALAZINE  50 mg Oral Q8H   revefenacin  175 mcg Nebulization Daily   tamsulosin  0.4 mg Oral Daily   Continuous Infusions:  feeding supplement (JEVITY 1.5 CAL/FIBER)     heparin 1,600 Units/hr (12/19/22 1425)   PRN Meds:.acetaminophen **OR** acetaminophen, alprazolam, hydrALAZINE, ipratropium-albuterol, labetalol, melatonin, ondansetron **OR** ondansetron (ZOFRAN) IV  Antimicrobials: Anti-infectives (From admission, onward)    Start     Dose/Rate Route Frequency Ordered Stop   12/17/22 2030  entecavir (BARACLUDE) tablet 0.5 mg        0.5 mg Oral Every other  day 12/17/22 2020          I have personally reviewed the following labs and images: CBC: Recent Labs  Lab 12/17/22 1748 12/17/22 1753 12/19/22 0846  WBC 9.4  --  12.7*  NEUTROABS 7.5  --   --   HGB 16.9 17.7* 19.0*  HCT 50.5 52.0 54.8*  MCV 90.7  --  90.4  PLT 170  --  195   BMP &GFR Recent Labs  Lab 12/17/22 1748 12/17/22 1753 12/18/22 0730 12/19/22 1106  NA 141 142 141 141  K 3.6 4.5 3.1* 4.1  CL 110 111 109 108  CO2 20*  --  21* 20*  GLUCOSE 129* 123* 122* 119*  BUN 19 30* 18 19  CREATININE 1.92* 1.90* 1.79* 1.71*  CALCIUM 9.4  --  9.2 9.4  MG  --   --  2.0 1.9  PHOS  --   --  2.8 2.6   Estimated Creatinine Clearance: 61.6 mL/min (A) (by C-G formula based on SCr of 1.71 mg/dL (H)). Liver & Pancreas: Recent Labs  Lab 12/17/22 1748 12/18/22 0730 12/19/22 1106  AST 14*  --   --   ALT 13  --   --   ALKPHOS 57  --   --   BILITOT 0.9  --   --   PROT 6.8  --   --   ALBUMIN 3.9 3.7 3.8   No results for input(s): "LIPASE", "AMYLASE" in the last 168 hours. No results for input(s): "AMMONIA" in the last 168 hours. Diabetic: Recent Labs    12/18/22 0114  HGBA1C 5.5   Recent Labs  Lab 12/17/22 1751  GLUCAP 113*   Cardiac Enzymes: No results for input(s): "CKTOTAL", "CKMB", "CKMBINDEX", "TROPONINI" in the last 168 hours. No results for input(s): "PROBNP" in the last 8760 hours. Coagulation Profile: Recent Labs  Lab 12/17/22 1748  INR 1.0   Thyroid Function Tests: No results for input(s): "TSH", "T4TOTAL", "FREET4", "T3FREE", "THYROIDAB" in the last 72 hours. Lipid Profile: Recent Labs    12/18/22 0114  CHOL 136  HDL 36*  LDLCALC 88  TRIG 59  CHOLHDL 3.8   Anemia Panel: No results for input(s): "VITAMINB12", "FOLATE", "FERRITIN", "TIBC", "IRON", "RETICCTPCT" in the last 72 hours. Urine analysis:    Component Value Date/Time   COLORURINE STRAW (A) 09/19/2022 0518   APPEARANCEUR CLEAR 09/19/2022 0518   LABSPEC 1.004 (L) 09/19/2022 0518    PHURINE 5.0 09/19/2022 0518   GLUCOSEU 150 (A) 09/19/2022 0518   HGBUR SMALL (A) 09/19/2022 0518   BILIRUBINUR NEGATIVE 09/19/2022 0518   KETONESUR NEGATIVE 09/19/2022 0518   PROTEINUR NEGATIVE 09/19/2022 0518  UROBILINOGEN 1.0 03/04/2015 2105   NITRITE NEGATIVE 09/19/2022 0518   LEUKOCYTESUR NEGATIVE 09/19/2022 0518   Sepsis Labs: Invalid input(s): "PROCALCITONIN", "LACTICIDVEN"  Microbiology: No results found for this or any previous visit (from the past 240 hour(s)).  Radiology Studies: ECHOCARDIOGRAM COMPLETE BUBBLE STUDY  Result Date: 12/19/2022    ECHOCARDIOGRAM REPORT   Patient Name:   THAYDEN GRAJALES Date of Exam: 12/19/2022 Medical Rec #:  [id number]        Height:       75.0 in Accession #:    [id number]       Weight:       243.2 lb Date of Birth:  1961-04-10        BSA:          2.384 m Patient Age:    62 years         BP:           207/119 mmHg Patient Gender: M                HR:           79 bpm. Exam Location:  Inpatient Procedure: 2D Echo, Cardiac Doppler, Color Doppler and Saline Contrast Bubble            Study Indications:    stroke  History:        Patient has prior history of Echocardiogram examinations, most                 recent 10/01/2020. CAD, Defibrillator, COPD; Risk                 Factors:Hypertension, Dyslipidemia and Sleep Apnea.  Sonographer:    Delcie Roch RDCS Referring Phys: [id number] ERIC CHEN  Sonographer Comments: Image acquisition challenging due to uncooperative patient. IMPRESSIONS  1. Left ventricular ejection fraction, by estimation, is 20 to 25%. The left ventricle has severely decreased function. The left ventricle demonstrates global hypokinesis. The left ventricular internal cavity size was severely dilated. There is moderate  left ventricular hypertrophy. Left ventricular diastolic parameters are indeterminate.  2. Right ventricular systolic function is normal. The right ventricular size is normal.  3. Left atrial size was moderately dilated.  4.  The mitral valve is normal in structure. No evidence of mitral valve regurgitation. No evidence of mitral stenosis.  5. The aortic valve has an indeterminant number of cusps. Aortic valve regurgitation is not visualized. No aortic stenosis is present.  6. Aortic dilatation noted. There is mild dilatation of the ascending aorta, measuring 40 mm.  7. Agitated saline contrast bubble study was negative, with no evidence of any interatrial shunt. FINDINGS  Left Ventricle: Left ventricular ejection fraction, by estimation, is 20 to 25%. The left ventricle has severely decreased function. The left ventricle demonstrates global hypokinesis. The left ventricular internal cavity size was severely dilated. There is moderate left ventricular hypertrophy. Left ventricular diastolic parameters are indeterminate. Right Ventricle: The right ventricular size is normal. Right ventricular systolic function is normal. Left Atrium: Left atrial size was moderately dilated. Right Atrium: Right atrial size was normal in size. Pericardium: Trivial pericardial effusion is present. Mitral Valve: The mitral valve is normal in structure. Mild mitral annular calcification. No evidence of mitral valve regurgitation. No evidence of mitral valve stenosis. Tricuspid Valve: The tricuspid valve is normal in structure. Tricuspid valve regurgitation is trivial. No evidence of tricuspid stenosis. Aortic Valve: The aortic valve has an indeterminant number of cusps. Aortic valve regurgitation is not visualized.  No aortic stenosis is present. Pulmonic Valve: The pulmonic valve was not well visualized. Pulmonic valve regurgitation is not visualized. No evidence of pulmonic stenosis. Aorta: Aortic dilatation noted. There is mild dilatation of the ascending aorta, measuring 40 mm. Venous: The inferior vena cava was not well visualized. IAS/Shunts: No atrial level shunt detected by color flow Doppler. Agitated saline contrast was given intravenously to evaluate  for intracardiac shunting. Agitated saline contrast bubble study was negative, with no evidence of any interatrial shunt. Additional Comments: A device lead is visualized.  LEFT VENTRICLE PLAX 2D LVIDd:         6.40 cm      Diastology LVIDs:         5.40 cm      LV e' medial:  4.13 cm/s LV PW:         1.50 cm      LV e' lateral: 4.90 cm/s LV IVS:        1.10 cm LVOT diam:     2.40 cm LV SV:         66 LV SV Index:   28 LVOT Area:     4.52 cm  LV Volumes (MOD) LV vol d, MOD A2C: 156.0 ml LV vol s, MOD A2C: 102.0 ml LV SV MOD A2C:     54.0 ml RIGHT VENTRICLE RV Basal diam:  3.20 cm RV S prime:     13.70 cm/s TAPSE (M-mode): 1.6 cm LEFT ATRIUM              Index        RIGHT ATRIUM           Index LA diam:        4.70 cm  1.97 cm/m   RA Area:     18.10 cm LA Vol (A2C):   90.6 ml  38.00 ml/m  RA Volume:   49.40 ml  20.72 ml/m LA Vol (A4C):   107.0 ml 44.87 ml/m LA Biplane Vol: 103.0 ml 43.20 ml/m  AORTIC VALVE LVOT Vmax:   83.80 cm/s LVOT Vmean:  51.400 cm/s LVOT VTI:    0.146 m  AORTA Ao Root diam: 3.40 cm Ao Asc diam:  4.00 cm  SHUNTS Systemic VTI:  0.15 m Systemic Diam: 2.40 cm Olga Millers MD Electronically signed by Olga Millers MD Signature Date/Time: 12/19/2022/12:13:29 PM    Final    CT HEAD WO CONTRAST  Result Date: 12/18/2022 CLINICAL DATA:  Follow-up examination for code stroke. EXAM: CT HEAD WITHOUT CONTRAST TECHNIQUE: Contiguous axial images were obtained from the base of the skull through the vertex without intravenous contrast. RADIATION DOSE REDUCTION: This exam was performed according to the departmental dose-optimization program which includes automated exposure control, adjustment of the mA and/or kV according to patient size and/or use of iterative reconstruction technique. COMPARISON:  Comparison made with prior CT from 12/17/2022. FINDINGS: Brain: Generalized age-related cerebral atrophy with chronic microvascular ischemic disease. Few small remote lacunar infarcts present about the  bilateral basal ganglia. Chronic infarct involving the right parieto-occipital region, stable. No acute intracranial hemorrhage. No visible acute or evolving large vessel territory infarct. No mass lesion, midline shift or mass effect. No hydrocephalus or extra-axial fluid collection. Vascular: No convincing abnormal hyperdense vessel. Prominent calcified atherosclerosis present at the skull base. Skull: Scalp soft tissues demonstrate no acute finding. Calvarium intact. Sinuses/Orbits: Globes and orbital soft tissues within normal limits. Paranasal sinuses are largely clear. No mastoid effusion. Other: None. IMPRESSION: 1. Stable head CT.  No acute intracranial hemorrhage or other abnormality. No visible acute or evolving large vessel territory infarct. 2. Generalized age-related cerebral atrophy with chronic microvascular ischemic disease, with a few small remote lacunar infarcts about the bilateral basal ganglia. 3. Chronic right parieto-occipital infarct, stable. Electronically Signed   By: Rise Mu M.D.   On: 12/18/2022 22:18      Girtha Kilgore T. Faustine Tates [facility]  If 7PM-7AM, please contact night-coverage [email or web address] 12/19/2022, 3:00 PM

## 2022-12-19 NOTE — Progress Notes (Signed)
Patient has not been responsive at the beginning of shift. Per shift report paatient has been obtunded. Patient not responding to painful stimulates. TF stopped, has very congestive , gurgling breath sounds. Dr. Jerrell Belfast came to [facility]. Transferred patient to IR with RRRN. Patient under IR team care at this time.

## 2022-12-19 NOTE — Progress Notes (Signed)
  Echocardiogram 2D Echocardiogram has been performed.  Delcie Roch 12/19/2022, 12:06 PM

## 2022-12-19 NOTE — Progress Notes (Signed)
Speech Language Pathology Treatment: Dysphagia  Patient Details Name: Cody Ortega MRN: [id number] DOB: 11-05-1960 Today's Date: 12/19/2022 Time: 1240-1250 SLP Time Calculation (min) (ACUTE ONLY): 10 min  Assessment / Plan / Recommendation Clinical Impression  Pt appears more severely impaired today than when seen yesterday in ED. Pt with snoring respirations, now almost unable to phonate. Suction applied to base of tongue to encourage mobilization of secretions; no gag or cough elicited on left side but pt did have a spontaneous swallow. When moved to right base of tongue pt had a weak aphonic cough. 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 Neurologist also at bedside. Will f/u for trials when pt more stable.   HPI HPI: 62 year old male history of obesity BMI 31.5, hypertension, coronary artery disease, history of 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  Patient's girlfriend states that he got caught up between the bed and the wall and fell down.  He refused EMS transport.  Several hours later he was noted to have continued weakness and bilateral upper and lower extremities.  He also had dysarthria.  Patient finally agreed to come to the hospital. CT clear, not MARI compatible. hx of CVA.      SLP Plan  Continue with current plan of care      Recommendations for follow up therapy are one component of a multi-disciplinary discharge planning process, led by the attending physician.  Recommendations may be updated based on patient status, additional functional criteria and insurance authorization.    Recommendations  Diet recommendations: NPO                              Continue with current plan of care     Fotini Lemus, Riley Nearing  12/19/2022, 1:34 PM

## 2022-12-19 NOTE — Progress Notes (Signed)
Patient had a CTA head and neck which showed worsening existing occlusion.  Neurology Dr. Jerrell Belfast on board and he has been already consulted IR for emergent stent placement.  Patient is on Xarelto and we cannot do thrombolysis at this time. Per neurology patient will be at neuro ICU after the procedure done at some point he will be transferred to [facility] care.   Tereasa Coop, MD [facility] 12/19/2022, 9:58 PM

## 2022-12-19 NOTE — Progress Notes (Addendum)
STROKE TEAM PROGRESS NOTE   BRIEF HPI This is a 62 yo man with pmhx significant for CAD, CHF, CKD stage IV, HL, HTN, a fib on xarelto, prior stroke who presents with L sided weakness and dysarthria. Outside of window for TNK and no LVO for thrombectomy. Nonadherent to zarelto but could not get exact reason due to dysarthria.    SIGNIFICANT HOSPITAL EVENTS 8/17 evening: presented to ED  INTERIM HISTORY/SUBJECTIVE Pt was sleeping deeply and difficult to wake up. He is unable to cooperate with exam including nodding yes or no to questions today. He does not follow any commands during physical exam. Cortrak order placed.   Per SLP who was also in room during interview, reports that she thinks pt has worsened since yesterday. Reports that he needs a coretrac (order placed) since yesterday. She suctioned his mouth and could not get him to gag with the suction.   OBJECTIVE  CBC    Component Value Date/Time   WBC 12.7 (H) 12/19/2022 0846   RBC 6.06 (H) 12/19/2022 0846   HGB 19.0 (H) 12/19/2022 0846   HGB 16.0 06/02/2022 1426   HCT 54.8 (H) 12/19/2022 0846   HCT 46.7 06/02/2022 1426   PLT 195 12/19/2022 0846   PLT 185 06/02/2022 1426   MCV 90.4 12/19/2022 0846   MCV 91 06/02/2022 1426   MCH 31.4 12/19/2022 0846   MCHC 34.7 12/19/2022 0846   RDW 13.5 12/19/2022 0846   RDW 15.1 06/02/2022 1426   LYMPHSABS 1.1 12/17/2022 1748   LYMPHSABS 0.8 10/15/2020 1118   MONOABS 0.6 12/17/2022 1748   EOSABS 0.1 12/17/2022 1748   EOSABS 0.1 10/15/2020 1118   BASOSABS 0.1 12/17/2022 1748   BASOSABS 0.0 10/15/2020 1118    BMET    Component Value Date/Time   NA 141 12/19/2022 1106   NA 133 (L) 06/02/2022 1426   K 4.1 12/19/2022 1106   CL 108 12/19/2022 1106   CO2 20 (L) 12/19/2022 1106   GLUCOSE 119 (H) 12/19/2022 1106   BUN 19 12/19/2022 1106   BUN 26 (H) 06/02/2022 1426   CREATININE 1.71 (H) 12/19/2022 1106   CREATININE 1.38 (H) 02/08/2016 1155   CALCIUM 9.4 12/19/2022 1106   EGFR 29  (L) 06/02/2022 1426   GFRNONAA 45 (L) 12/19/2022 1106    IMAGING past 24 hours ECHOCARDIOGRAM COMPLETE BUBBLE STUDY  Result Date: 12/19/2022    ECHOCARDIOGRAM REPORT   Patient Name:   ADREAN MOLZAHN Date of Exam: 12/19/2022 Medical Rec #:  [id number]        Height:       75.0 in Accession #:    [id number]       Weight:       243.2 lb Date of Birth:  [birth year]        BSA:          2.384 m Patient Age:    62 years         BP:           207/119 mmHg Patient Gender: M                HR:           79 bpm. Exam Location:  Inpatient Procedure: 2D Echo, Cardiac Doppler, Color Doppler and Saline Contrast Bubble            Study Indications:    stroke  History:        Patient has prior history of Echocardiogram examinations, most  recent 10/01/2020. CAD, Defibrillator, COPD; Risk                 Factors:Hypertension, Dyslipidemia and Sleep Apnea.  Sonographer:    Delcie Roch RDCS Referring Phys: [phone] ERIC CHEN  Sonographer Comments: Image acquisition challenging due to uncooperative patient. IMPRESSIONS  1. Left ventricular ejection fraction, by estimation, is 20 to 25%. The left ventricle has severely decreased function. The left ventricle demonstrates global hypokinesis. The left ventricular internal cavity size was severely dilated. There is moderate  left ventricular hypertrophy. Left ventricular diastolic parameters are indeterminate.  2. Right ventricular systolic function is normal. The right ventricular size is normal.  3. Left atrial size was moderately dilated.  4. The mitral valve is normal in structure. No evidence of mitral valve regurgitation. No evidence of mitral stenosis.  5. The aortic valve has an indeterminant number of cusps. Aortic valve regurgitation is not visualized. No aortic stenosis is present.  6. Aortic dilatation noted. There is mild dilatation of the ascending aorta, measuring 40 mm.  7. Agitated saline contrast bubble study was negative, with no evidence of any  interatrial shunt. FINDINGS  Left Ventricle: Left ventricular ejection fraction, by estimation, is 20 to 25%. The left ventricle has severely decreased function. The left ventricle demonstrates global hypokinesis. The left ventricular internal cavity size was severely dilated. There is moderate left ventricular hypertrophy. Left ventricular diastolic parameters are indeterminate. Right Ventricle: The right ventricular size is normal. Right ventricular systolic function is normal. Left Atrium: Left atrial size was moderately dilated. Right Atrium: Right atrial size was normal in size. Pericardium: Trivial pericardial effusion is present. Mitral Valve: The mitral valve is normal in structure. Mild mitral annular calcification. No evidence of mitral valve regurgitation. No evidence of mitral valve stenosis. Tricuspid Valve: The tricuspid valve is normal in structure. Tricuspid valve regurgitation is trivial. No evidence of tricuspid stenosis. Aortic Valve: The aortic valve has an indeterminant number of cusps. Aortic valve regurgitation is not visualized. No aortic stenosis is present. Pulmonic Valve: The pulmonic valve was not well visualized. Pulmonic valve regurgitation is not visualized. No evidence of pulmonic stenosis. Aorta: Aortic dilatation noted. There is mild dilatation of the ascending aorta, measuring 40 mm. Venous: The inferior vena cava was not well visualized. IAS/Shunts: No atrial level shunt detected by color flow Doppler. Agitated saline contrast was given intravenously to evaluate for intracardiac shunting. Agitated saline contrast bubble study was negative, with no evidence of any interatrial shunt. Additional Comments: A device lead is visualized.  LEFT VENTRICLE PLAX 2D LVIDd:         6.40 cm      Diastology LVIDs:         5.40 cm      LV e' medial:  4.13 cm/s LV PW:         1.50 cm      LV e' lateral: 4.90 cm/s LV IVS:        1.10 cm LVOT diam:     2.40 cm LV SV:         66 LV SV Index:   28  LVOT Area:     4.52 cm  LV Volumes (MOD) LV vol d, MOD A2C: 156.0 ml LV vol s, MOD A2C: 102.0 ml LV SV MOD A2C:     54.0 ml RIGHT VENTRICLE RV Basal diam:  3.20 cm RV S prime:     13.70 cm/s TAPSE (M-mode): 1.6 cm LEFT ATRIUM  Index        RIGHT ATRIUM           Index LA diam:        4.70 cm  1.97 cm/m   RA Area:     18.10 cm LA Vol (A2C):   90.6 ml  38.00 ml/m  RA Volume:   49.40 ml  20.72 ml/m LA Vol (A4C):   107.0 ml 44.87 ml/m LA Biplane Vol: 103.0 ml 43.20 ml/m  AORTIC VALVE LVOT Vmax:   83.80 cm/s LVOT Vmean:  51.400 cm/s LVOT VTI:    0.146 m  AORTA Ao Root diam: 3.40 cm Ao Asc diam:  4.00 cm  SHUNTS Systemic VTI:  0.15 m Systemic Diam: 2.40 cm Olga Millers MD Electronically signed by Olga Millers MD Signature Date/Time: 12/19/2022/12:13:29 PM    Final    CT HEAD WO CONTRAST  Result Date: 12/18/2022 CLINICAL DATA:  Follow-up examination for code stroke. EXAM: CT HEAD WITHOUT CONTRAST TECHNIQUE: Contiguous axial images were obtained from the base of the skull through the vertex without intravenous contrast. RADIATION DOSE REDUCTION: This exam was performed according to the departmental dose-optimization program which includes automated exposure control, adjustment of the mA and/or kV according to patient size and/or use of iterative reconstruction technique. COMPARISON:  Comparison made with prior CT from 12/17/2022. FINDINGS: Brain: Generalized age-related cerebral atrophy with chronic microvascular ischemic disease. Few small remote lacunar infarcts present about the bilateral basal ganglia. Chronic infarct involving the right parieto-occipital region, stable. No acute intracranial hemorrhage. No visible acute or evolving large vessel territory infarct. No mass lesion, midline shift or mass effect. No hydrocephalus or extra-axial fluid collection. Vascular: No convincing abnormal hyperdense vessel. Prominent calcified atherosclerosis present at the skull base. Skull: Scalp soft  tissues demonstrate no acute finding. Calvarium intact. Sinuses/Orbits: Globes and orbital soft tissues within normal limits. Paranasal sinuses are largely clear. No mastoid effusion. Other: None. IMPRESSION: 1. Stable head CT. No acute intracranial hemorrhage or other abnormality. No visible acute or evolving large vessel territory infarct. 2. Generalized age-related cerebral atrophy with chronic microvascular ischemic disease, with a few small remote lacunar infarcts about the bilateral basal ganglia. 3. Chronic right parieto-occipital infarct, stable. Electronically Signed   By: Rise Mu M.D.   On: 12/18/2022 22:18    Vitals:   12/19/22 1100 12/19/22 1131 12/19/22 1229 12/19/22 1400  BP: (!) 193/91 (!) 204/135 (!) 188/99 (!) 181/104  Pulse:  91    Resp:  19    Temp:  98.4 F (36.9 C)    TempSrc:  Axillary    SpO2:  98%    Weight:      Height:         PHYSICAL EXAM General:  uncomfortable and ill-appearing.  Respiratory:  lots of upper airway and gargling noises. Unable to produce a cough.   NEURO:  Mental Status: somnolent but responsive to verbal and nonpainful tactile stimulus. patient is not able to give clear and coherent history. His attention appears impaired.  Speech/Language: speech is with dysarthria. No speech production today including no attempts with aphonia.   Cranial Nerves:  II: PERRL. Difficult to assess visual fields.  III, IV, VI: EOMI as evident by eyes tracking in both directions. Eyelids elevate symmetrically.  V: unable to assess due to mental status / dysarthria VII: Face is symmetrical resting VIII: unable to assess due to mental status / dysarthria. Does appear to respond to verbal stimuli IX, X: no gag reflex to suction deep  in throat.  XI: unable to assess due to mental status / dysarthria XII: unable to assess due to mental status / dysarthria Motor: difficult to assess but held both arms brief with a RUE drift present. Difficult to assess  lower extremities.  Tone: is normal and bulk is normal, possible decreased in R arm.  Sensation- unable to assess due to mental status / dysarthria   Coordination: unable to assess due to mental status / dysarthria Gait- deferred   ASSESSMENT/PLAN  Acute Ischemic Infarct suspected Etiology:  cardioembolic (nonadherent on Xarelto)  Code Stroke CT head No acute abnormality. ASPECTS 10.    CT cervical spine no acute fracture or subluxation  CT head 24hr p/ost stroke stable, unchanged CTA head neck  pending Not able to have MRI due to generator and leads are from different providers Carotid Doppler b/l ICA 40-59% stenosis 2D Echo EF 20-25%. Negative bubble.  LDL 88 HgbA1c 5.5 VTE prophylaxis - on heparin IV Xarelto (rivaroxaban) daily and aspirin 81 prior to admission, now on Xarelto and aspirin 81.  Therapy recommendations:  CIR Disposition:  pending  Hx of Stroke/TIA CVA 16 years ago with residue visual difficulty 10/2021 admitted in [place] for generalized weakness and slurred speech.  CT no acute finding.  CT head and neck multiple severe intracranial stenosis, most notably moderate severe stenosis right M1, short segment proximal right A1, moderate severe stenosis of basilar artery.  Right P2 and P3 segment of occlusion.  Left V4 occlusion, severe near complete atherosclerotic narrowing of bilateral ICAs, right VA origin stenosis.  Continue on aspirin, Xarelto and Lipitor.  Atrial fibrillation Home Meds: Xarelto 20 mg once daily, carvedilol 12.5 mg one and half pills twice daily, amiodarone 200 once dialy On amiodraone, coreg Continue telemetry monitoring Now on Xarelto and aspirin 81   Hypertension Home meds:  carvedilol 12.5 mg one and half pills twice daily, entresto 49-51 mg twice daily, hydralazine 100 mg twice daily  BP on the high end, gradually normalize BP in 3-5 days On hydralazine, doxazosine, coreg BP goal: normotensive  Hyperlipidemia Home meds:  atorvastatin 10  mg and zetia 10 mg once daily  LDL 88, goal < 70 Increase Lipitor to 20 and continue Zetia 10. Continue statin at discharge  CHF CAD Status post ICD in 2012, generator change out in 2021 Not compatible with MRI due to generator and leads from different companies 09/2020 TEE EF 30-35% This admission EF 20-25% On GDMT at home Now on entresto, coreg Recommend cardiology consult given decreased EF  Substance Use Patient uses cannabis UDS positive for [facility]  Cessation education will be provided  Dysphagia Patient has post-stroke dysphagia, SLP consulted N.p.o. Had cortrak placed Resume home meds via cortrak On TF  Other Stroke Risk Factors Obesity, Body mass index is 30.39 kg/m., BMI >/= 30 associated with increased stroke risk, recommend weight loss, diet and exercise as appropriate  History of cocaine use OSA not on CPAP  Other Active Problems HFrEF s/p AICD, managed by primary team, AICD interrogation planned Hypokalemia, K 3.1-> supplement CKD, Cr lowest its been in 6 months, monitor post contrast today Leukocytosis, WBC 9.4->12.7 Primary respiratory alkalosis with secondary metabolic alkalosis  Hospital day # 1  Meryl Dare, MD PGY-1 Psychiatry Resident 12/19/2022, 3:17 PM  ATTENDING NOTE: I reviewed above note and agree with the assessment and plan. Pt was seen and examined.   No family at the bedside, pt speech therapist at the bedside. Pt more lethargic, not answer questions as yesterday,  not quite following commands, whole body weakness, barely able to hold BUEs against gravity. Frequent wet cough, difficulty handling secretions.   Cortrrak placed and resume home meds via cortrak, will check CTA head and neck to evaluate vasculature. Repeat CT last night showed no acute finding. Currently pt on Xarelto, ASA and statin. EF 20-25%, decreased from prior, on GDMT meds, recommend cardiology consult. Will follow.   For detailed assessment and plan, please refer to  above/below as I have made changes wherever appropriate.   Marvel Plan, MD PhD Stroke Neurology 12/19/2022 7:06 PM  Patient condition worsened within the last 24 hours, more lethargic and whole body weakness. I discussed with Dr. Alanda Slim. I spent  extensive face-to-face time with the patient, more than 50% of which was spent in counseling and coordination of care, reviewing test results, images and medication, and discussing the diagnosis, treatment plan and potential prognosis. This patient's care requiresreview of multiple databases, neurological assessment, other specialists and medical decision making of high complexity.         To contact Stroke Continuity provider, please refer to [email or web address]. After hours, contact General Neurology

## 2022-12-19 NOTE — Progress Notes (Signed)
PT Cancellation Note  Patient Details Name: Cody Ortega MRN: [id number] DOB: [birth year]   Cancelled Treatment:    Reason Eval/Treat Not Completed: Medical issues which prohibited therapy. Discussed pt case with RN due to continued elevated BP. Will hold mobility today and continue to follow for stabilization of BP to continue with PT POC.    Marylynn Pearson 12/19/2022, 1:06 PM  Conni Slipper, PT, DPT Acute Rehabilitation Services Secure Chat Preferred Office: [phone]

## 2022-12-19 NOTE — Progress Notes (Signed)
ANTICOAGULATION CONSULT NOTE  Pharmacy Consult for Xarelto > heparin Indication: atrial fibrillation  Allergies  Allergen Reactions   Prochlorperazine Other (See Comments)    Medication caused a lot of issues like blurred vision, nausea, pain, and carpool turnel   Codeine Itching, Nausea And Vomiting and Hives   Hydrocodone-Acetaminophen Itching and Nausea And Vomiting    Patient Measurements: Height: 6\' 3"  (190.5 cm) Weight: 110.3 kg (243 lb 2.7 oz) IBW/kg (Calculated) : 84.5 Heparin Dosing Weight: 108.3 kg   Vital Signs: Temp: 98.4 F (36.9 C) (08/19 1131) Temp Source: Axillary (08/19 1131) BP: 188/99 (08/19 1229) Pulse Rate: 91 (08/19 1131)  Labs: Recent Labs    12/17/22 1748 12/17/22 1753 12/18/22 0730 12/18/22 1548 12/19/22 0504 12/19/22 0846 12/19/22 1106  HGB 16.9 17.7*  --   --   --  19.0*  --   HCT 50.5 52.0  --   --   --  54.8*  --   PLT 170  --   --   --   --  195  --   APTT 26  --  38*  --   --   --   --   LABPROT 13.6  --   --   --   --   --   --   INR 1.0  --   --   --   --   --   --   HEPARINUNFRC  --   --  0.12* 0.35 0.58  --   --   CREATININE 1.92* 1.90* 1.79*  --   --   --  1.71*    Estimated Creatinine Clearance: 61.6 mL/min (A) (by C-G formula based on SCr of 1.71 mg/dL (H)).  Assessment: 71 yom presenting with weakness/dysarthria, CT showing no acute infarcts but old infarct - on Xarelto PTA for hx Afib (LD 8/16 PM). Did not pass bedside swallow exam so will bridge with heparin for now.    Heparin level is above goal at 0.58 on 1700 units/hr. No bleeding noted, CBC stable. Cortrak placed.   Goal of Therapy:  Heparin level 0.3-0.5 units/ml Monitor platelets by anticoagulation protocol: Yes   Plan:  Decrease heparin infusion to 1600 units/hr  Monitor daily heparin level, CBC, and s/sx of bleeding  Plan is to transition to Xarelto once Cortrak placement confirmed   Thank you for involving pharmacy in this patient's care.  Loura Back, PharmD, BCPS Clinical Pharmacist Clinical phone for 12/19/2022 is [phone] 12/19/2022 1:13 PM

## 2022-12-20 ENCOUNTER — Inpatient Hospital Stay (HOSPITAL_COMMUNITY): Payer: Medicaid Other

## 2022-12-20 ENCOUNTER — Encounter (HOSPITAL_COMMUNITY): Payer: Self-pay | Admitting: Radiology

## 2022-12-20 DIAGNOSIS — Z95810 Presence of automatic (implantable) cardiac defibrillator: Secondary | ICD-10-CM | POA: Diagnosis not present

## 2022-12-20 DIAGNOSIS — J984 Other disorders of lung: Secondary | ICD-10-CM | POA: Diagnosis not present

## 2022-12-20 DIAGNOSIS — J9690 Respiratory failure, unspecified, unspecified whether with hypoxia or hypercapnia: Secondary | ICD-10-CM | POA: Diagnosis not present

## 2022-12-20 DIAGNOSIS — R9389 Abnormal findings on diagnostic imaging of other specified body structures: Secondary | ICD-10-CM | POA: Diagnosis not present

## 2022-12-20 DIAGNOSIS — I651 Occlusion and stenosis of basilar artery: Secondary | ICD-10-CM | POA: Diagnosis not present

## 2022-12-20 DIAGNOSIS — Z4682 Encounter for fitting and adjustment of non-vascular catheter: Secondary | ICD-10-CM | POA: Diagnosis not present

## 2022-12-20 DIAGNOSIS — I639 Cerebral infarction, unspecified: Secondary | ICD-10-CM | POA: Diagnosis not present

## 2022-12-20 DIAGNOSIS — I609 Nontraumatic subarachnoid hemorrhage, unspecified: Secondary | ICD-10-CM | POA: Diagnosis not present

## 2022-12-20 DIAGNOSIS — I6312 Cerebral infarction due to embolism of basilar artery: Secondary | ICD-10-CM

## 2022-12-20 DIAGNOSIS — J9811 Atelectasis: Secondary | ICD-10-CM | POA: Diagnosis not present

## 2022-12-20 DIAGNOSIS — I672 Cerebral atherosclerosis: Secondary | ICD-10-CM | POA: Diagnosis not present

## 2022-12-20 DIAGNOSIS — I6322 Cerebral infarction due to unspecified occlusion or stenosis of basilar arteries: Secondary | ICD-10-CM | POA: Diagnosis not present

## 2022-12-20 DIAGNOSIS — J9601 Acute respiratory failure with hypoxia: Secondary | ICD-10-CM

## 2022-12-20 DIAGNOSIS — Z8673 Personal history of transient ischemic attack (TIA), and cerebral infarction without residual deficits: Secondary | ICD-10-CM | POA: Diagnosis not present

## 2022-12-20 HISTORY — PX: IR PERCUTANEOUS ART THROMBECTOMY/INFUSION INTRACRANIAL INC DIAG ANGIO: IMG6087

## 2022-12-20 HISTORY — PX: IR US GUIDE VASC ACCESS RIGHT: IMG2390

## 2022-12-20 HISTORY — PX: IR ANGIO INTRA EXTRACRAN SEL COM CAROTID INNOMINATE BILAT MOD SED: IMG5360

## 2022-12-20 HISTORY — PX: IR CT HEAD LTD: IMG2386

## 2022-12-20 HISTORY — PX: IR ANGIOGRAM FOLLOW UP STUDY: IMG697

## 2022-12-20 LAB — POCT I-STAT 7, (LYTES, BLD GAS, ICA,H+H)
Acid-base deficit: 5 mmol/L — ABNORMAL HIGH (ref 0.0–2.0)
Acid-base deficit: 5 mmol/L — ABNORMAL HIGH (ref 0.0–2.0)
Bicarbonate: 21.8 mmol/L (ref 20.0–28.0)
Bicarbonate: 22.7 mmol/L (ref 20.0–28.0)
Calcium, Ion: 1.24 mmol/L (ref 1.15–1.40)
Calcium, Ion: 1.28 mmol/L (ref 1.15–1.40)
HCT: 49 % (ref 39.0–52.0)
HCT: 55 % — ABNORMAL HIGH (ref 39.0–52.0)
Hemoglobin: 16.7 g/dL (ref 13.0–17.0)
Hemoglobin: 18.7 g/dL — ABNORMAL HIGH (ref 13.0–17.0)
O2 Saturation: 86 %
O2 Saturation: 99 %
Potassium: 4.5 mmol/L (ref 3.5–5.1)
Potassium: 4.7 mmol/L (ref 3.5–5.1)
Sodium: 141 mmol/L (ref 135–145)
Sodium: 142 mmol/L (ref 135–145)
TCO2: 23 mmol/L (ref 22–32)
TCO2: 24 mmol/L (ref 22–32)
pCO2 arterial: 45.7 mmHg (ref 32–48)
pCO2 arterial: 49.4 mmHg — ABNORMAL HIGH (ref 32–48)
pH, Arterial: 7.271 — ABNORMAL LOW (ref 7.35–7.45)
pH, Arterial: 7.287 — ABNORMAL LOW (ref 7.35–7.45)
pO2, Arterial: 135 mmHg — ABNORMAL HIGH (ref 83–108)
pO2, Arterial: 60 mmHg — ABNORMAL LOW (ref 83–108)

## 2022-12-20 LAB — BASIC METABOLIC PANEL
Anion gap: 10 (ref 5–15)
BUN: 35 mg/dL — ABNORMAL HIGH (ref 6–20)
CO2: 20 mmol/L — ABNORMAL LOW (ref 22–32)
Calcium: 9.1 mg/dL (ref 8.9–10.3)
Chloride: 108 mmol/L (ref 98–111)
Creatinine, Ser: 2.07 mg/dL — ABNORMAL HIGH (ref 0.61–1.24)
GFR, Estimated: 36 mL/min — ABNORMAL LOW (ref >60)
Glucose, Bld: 149 mg/dL — ABNORMAL HIGH (ref 70–99)
Potassium: 4.1 mmol/L (ref 3.5–5.1)
Sodium: 138 mmol/L (ref 135–145)

## 2022-12-20 LAB — CBC
HCT: 53.3 % — ABNORMAL HIGH (ref 39.0–52.0)
Hemoglobin: 17.5 g/dL — ABNORMAL HIGH (ref 13.0–17.0)
MCH: 30.1 pg (ref 26.0–34.0)
MCHC: 32.8 g/dL (ref 30.0–36.0)
MCV: 91.7 fL (ref 80.0–100.0)
Platelets: 177 10*3/uL (ref 150–400)
RBC: 5.81 MIL/uL (ref 4.22–5.81)
RDW: 14.1 % (ref 11.5–15.5)
WBC: 15.8 10*3/uL — ABNORMAL HIGH (ref 4.0–10.5)
nRBC: 0 % (ref 0.0–0.2)

## 2022-12-20 LAB — GLUCOSE, CAPILLARY
Glucose-Capillary: 170 mg/dL — ABNORMAL HIGH (ref 70–99)
Glucose-Capillary: 160 mg/dL — ABNORMAL HIGH (ref 70–99)
Glucose-Capillary: 139 mg/dL — ABNORMAL HIGH (ref 70–99)
Glucose-Capillary: 121 mg/dL — ABNORMAL HIGH (ref 70–99)
Glucose-Capillary: 143 mg/dL — ABNORMAL HIGH (ref 70–99)

## 2022-12-20 LAB — PROCALCITONIN: Procalcitonin: <0.1 ng/mL

## 2022-12-20 LAB — RPR: RPR Ser Ql: NONREACTIVE

## 2022-12-20 LAB — MRSA NEXT GEN BY PCR, NASAL: MRSA by PCR Next Gen: NOT DETECTED

## 2022-12-20 MED ORDER — CLEVIDIPINE BUTYRATE 0.5 MG/ML IV EMUL
0.0000 mg/h | INTRAVENOUS | Status: DC
  Administered 2022-12-20: 4 mg/h via INTRAVENOUS
  Administered 2022-12-20 (×2): 2 mg/h via INTRAVENOUS
  Filled 2022-12-20 (×2): qty 50

## 2022-12-20 MED ORDER — PROPOFOL 500 MG/50ML IV EMUL: INTRAVENOUS | Status: DC | PRN

## 2022-12-20 MED ORDER — FENTANYL CITRATE (PF) 100 MCG/2ML IJ SOLN: 50.0000 ug | INTRAMUSCULAR | Status: DC | PRN

## 2022-12-20 MED ORDER — CHLORHEXIDINE GLUCONATE CLOTH 2 % EX PADS
6.0000 | MEDICATED_PAD | Freq: Every day | CUTANEOUS | Status: DC
  Administered 2022-12-20 – 2022-12-22 (×3): 6 via TOPICAL

## 2022-12-20 MED ORDER — ORAL CARE MOUTH RINSE
15.0000 mL | OROMUCOSAL | Status: DC
  Administered 2022-12-20 – 2022-12-22 (×31): 15 mL via OROMUCOSAL

## 2022-12-20 MED ORDER — CLEVIDIPINE BUTYRATE 0.5 MG/ML IV EMUL
INTRAVENOUS | Status: AC
  Filled 2022-12-20: qty 50

## 2022-12-20 MED ORDER — FAMOTIDINE 20 MG PO TABS
20.0000 mg | ORAL_TABLET | Freq: Two times a day (BID) | ORAL | Status: DC
  Administered 2022-12-20 – 2022-12-21 (×4): 20 mg
  Filled 2022-12-20 (×5): qty 1

## 2022-12-20 MED ORDER — SODIUM CHLORIDE 0.9 % IV SOLN
3.0000 g | Freq: Four times a day (QID) | INTRAVENOUS | Status: DC
  Administered 2022-12-20 – 2022-12-22 (×9): 3 g via INTRAVENOUS
  Filled 2022-12-20 (×9): qty 8

## 2022-12-20 MED ORDER — DOCUSATE SODIUM 50 MG/5ML PO LIQD
100.0000 mg | Freq: Two times a day (BID) | ORAL | Status: DC
  Administered 2022-12-20 – 2022-12-22 (×4): 100 mg
  Filled 2022-12-20 (×5): qty 10

## 2022-12-20 MED ORDER — PROPOFOL 500 MG/50ML IV EMUL
INTRAVENOUS | Status: DC | PRN
  Administered 2022-12-19: 40 ug/kg/min via INTRAVENOUS

## 2022-12-20 MED ORDER — ORAL CARE MOUTH RINSE: 15.0000 mL | OROMUCOSAL | Status: DC | PRN

## 2022-12-20 MED ORDER — SODIUM CHLORIDE 0.9 % IV BOLUS: 250.0000 mL | INTRAVENOUS | Status: AC | PRN

## 2022-12-20 MED ORDER — POLYETHYLENE GLYCOL 3350 17 G PO PACK
17.0000 g | PACK | Freq: Every day | ORAL | Status: DC
  Administered 2022-12-20 – 2022-12-22 (×2): 17 g
  Filled 2022-12-20 (×3): qty 1

## 2022-12-20 MED ORDER — PROPOFOL 1000 MG/100ML IV EMUL
0.0000 ug/kg/min | INTRAVENOUS | Status: DC
  Administered 2022-12-20: 30 ug/kg/min via INTRAVENOUS
  Filled 2022-12-20 (×2): qty 100

## 2022-12-20 MED ORDER — SODIUM CHLORIDE 0.9 % IV SOLN
250.0000 mL | INTRAVENOUS | Status: DC
  Administered 2022-12-20: 250 mL via INTRAVENOUS

## 2022-12-20 MED ORDER — INSULIN ASPART 100 UNIT/ML IJ SOLN
0.0000 [IU] | INTRAMUSCULAR | Status: DC
  Administered 2022-12-20 – 2022-12-22 (×9): 2 [IU] via SUBCUTANEOUS

## 2022-12-20 MED ORDER — NOREPINEPHRINE 4 MG/250ML-% IV SOLN
2.0000 ug/min | INTRAVENOUS | Status: DC
  Administered 2022-12-20: 2 ug/min via INTRAVENOUS
  Filled 2022-12-20: qty 250

## 2022-12-20 NOTE — Progress Notes (Signed)
Referring Physician(s): Stroke team   Supervising Physician: Baldemar Lenis  Patient Status:  [facility] - In-pt  Chief Complaint:  Code stroke, L sided weakness and dysarthria. Occlusion of the mid basilar artery.  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 No hydrocephalus. Dr. Joana Reamer Rorigues.  Subjective:  Patient seen with Dr. Joana Reamer Rorigues. Remains intubated, sedated, RN and significant other ay bedside. RN states pt was posturing when weaned off sedation, weaning off sedation has been difficulty as patient gets agitated.  Dr. Dr. 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 Significant other verbalized understanding, she was encouraged to contact [facility] for questions and concerns.   Allergies: Prochlorperazine, Codeine, and Hydrocodone-acetaminophen  Medications: Prior to Admission medications   Medication Sig Start Date End Date Taking? Authorizing Provider  acetaminophen (TYLENOL) 500 MG tablet Take 1,000 mg by mouth every 6 (six) hours as needed for moderate pain or headache.   Yes [provider]  albuterol (VENTOLIN HFA) 108 (90 Base) MCG/ACT inhaler INHALE 2 PUFFS INTO THE LUNGS EVERY 6 HOURS AS NEEDED FOR WHEEZING OR SHORTNESS OF BREATH 12/13/22  Yes Marcine Matar, MD  alprazolam Prudy Feeler) 2 MG tablet Take 2 mg by mouth daily as needed for anxiety.   Yes [provider]  Artificial Tear Solution (TEARS NATURALE OP) Place 1 drop into both eyes daily as needed (dry eyes).   Yes [provider]  aspirin EC 81 MG tablet Take 81 mg by  mouth daily. Swallow whole.   Yes [provider]  atorvastatin (LIPITOR) 10 MG tablet TAKE 1 TABLET(10 MG) BY MOUTH DAILY 11/21/22  Yes Tonny Bollman, MD  carvedilol (COREG) 12.5 MG tablet TAKE 1 AND 1/2 TABLETS BY MOUTH TWICE DAILY 12/13/22  Yes Marcine Matar, MD  dapagliflozin propanediol (FARXIGA) 10 MG TABS tablet Take 1 tablet (10 mg total) by mouth daily before breakfast. 04/14/22  Yes Tonny Bollman, MD  entecavir (BARACLUDE) 0.5 MG tablet Take 1 tablet (0.5 mg total) by mouth every other day. 08/16/22  Yes Armbruster, Willaim Rayas, MD  ENTRESTO 49-51 MG TAKE 1 TABLET BY MOUTH TWICE DAILY 02/15/22  Yes Tonny Bollman, MD  ezetimibe (ZETIA) 10 MG tablet TAKE 1 TABLET(10 MG) BY MOUTH DAILY 08/29/22  Yes Tonny Bollman, MD  furosemide (LASIX) 40 MG tablet TAKE 1/2 TABLET(20 MG) BY MOUTH DAILY 12/29/21  Yes Tonny Bollman, MD  hydrALAZINE (APRESOLINE) 100 MG tablet TAKE 1 TABLET(100 MG) BY MOUTH IN THE MORNING AND AT BEDTIME 04/26/22  Yes Nahser, Deloris Ping, MD  ondansetron (ZOFRAN-ODT) 8 MG disintegrating tablet Take 8 mg by mouth daily. 12/11/19  Yes [provider]  Rectal Protectant-Emollient (CALMOL-4) 76-10 % SUPP Use as directed once to twice daily 04/08/22  Yes Armbruster, Willaim Rayas, MD  RESTASIS 0.05 % ophthalmic emulsion Place 1 drop into both eyes in the morning. 11/25/21  Yes [provider]  sildenafil (REVATIO) 20 MG tablet Take 1 tablet (20 mg total) by mouth as needed. 12/03/21  Yes Tonny Bollman, MD  SYMBICORT 160-4.5 MCG/ACT inhaler INHALE 2 PUFFS INTO THE LUNGS TWICE  DAILY 11/10/21  Yes Marcine Matar, MD  tamsulosin (FLOMAX) 0.4 MG CAPS capsule TAKE 1 CAPSULE(0.4 MG) BY MOUTH DAILY 10/25/22  Yes Marcine Matar, MD  XARELTO 20 MG TABS tablet TAKE 1 TABLET(20 MG) BY MOUTH DAILY WITH SUPPER 09/28/22  Yes Camnitz, Will Daphine Deutscher, MD  amiodarone (PACERONE) 200 MG tablet Take 1 tablet (200 mg total) by mouth daily. Patient not taking: Reported on 12/17/2022  12/09/21   Regan Lemming, MD  DULoxetine (CYMBALTA) 30 MG capsule Take 90 mg by mouth daily with breakfast. Patient not taking: Reported on 12/17/2022    [provider]  fluticasone (FLONASE) 50 MCG/ACT nasal spray SHAKE LIQUID AND USE 1 SPRAY IN [facility] NOSTRIL DAILY Patient not taking: Reported on 12/17/2022 12/13/22   Marcine Matar, MD  polyethylene glycol (MIRALAX) 17 g packet Take 17 g by mouth 2 (two) times daily. Take as directed, Daily, titrate as needed Patient not taking: Reported on 12/17/2022 04/08/22   Benancio Deeds, MD  zaleplon (SONATA) 10 MG capsule Take 10 mg by mouth at bedtime. Patient not taking: Reported on 12/17/2022 09/20/22   [provider]     Vital Signs: BP (!) 124/90   Pulse (!) 59   Temp 97.9 F (36.6 C) (Oral)   Resp 20   Ht 6\' 3"  (1.905 m)   Wt 250 lb 7.1 oz (113.6 kg)   SpO2 97%   BMI 31.30 kg/m   Physical Exam  Imaging: Portable Chest xray  Result Date: 12/20/2022 CLINICAL DATA:  History of stroke and right lung atelectasis EXAM: PORTABLE CHEST 1 VIEW COMPARISON:  Chest radiograph dated 12/20/2022 at 1:38 a.m. FINDINGS: Lines/tubes: Endotracheal tube tip projects 6.1 cm above the carina Enteric tube tip reaches the diaphragm and terminates below the field of view. Left chest wall ICD lead projects over the right ventricle. Lungs: Slightly increased right lung volume loss with right upper lobe atelectasis. Pleura: No pneumothorax or pleural effusion. Heart/mediastinum: Similar mildly enlarged cardiomediastinal silhouette. Bones: No acute osseous abnormality. IMPRESSION: 1. Slightly increased right lung volume loss with right upper lobe atelectasis. 2. Endotracheal tube tip projects 6.1 cm above the carina. Electronically Signed   By: Agustin Cree M.D.   On: 12/20/2022 07:49   CT HEAD WO CONTRAST ( )  Result Date: 12/20/2022 CLINICAL DATA:  Status post thrombectomy EXAM: CT HEAD WITHOUT CONTRAST TECHNIQUE: Contiguous axial  images were obtained from the base of the skull through the vertex without intravenous contrast. RADIATION DOSE REDUCTION: This exam was performed according to the departmental dose-optimization program which includes automated exposure control, adjustment of the mA and/or kV according to patient size and/or use of iterative reconstruction technique. COMPARISON:  12/19/2022 CTA head and neck, intra procedural flat panel CT. FINDINGS: Brain: Hyperdense material in the subarachnoid spaces of the inferomedial parietal lobes, right greater than left occipital lobes, and right greater than left cerebellar hemispheres, which roughly correlates with the area of hyperdensity on the prior CT, favored to represent contrast staining and a component of subarachnoid hemorrhage. No evidence of mass, mass effect, or midline shift. No hydrocephalus. Redemonstrated hypodensity in the right parieto-occipital region. Vascular: Atherosclerotic calcifications in the intracranial carotid and vertebral arteries. Contrast is noted in the vasculature. Interval placement of coils in the proximal right PCA territory. Skull: Negative for fracture or focal lesion. Sinuses/Orbits: Mild mucosal thickening in the ethmoid air cells. Partially imaged nasogastric tube. Other: The mastoid air cells are well aerated. IMPRESSION: Hyperdense material in the subarachnoid spaces  of the inferomedial parietal lobes, right greater than left occipital lobes, and right greater than left cerebellar hemispheres, favored to represent contrast staining and a component of subarachnoid hemorrhage, which appears roughly unchanged from the intra-procedural CT. Electronically Signed   By: Wiliam Ke M.D.   On: 12/20/2022 03:45   DG CHEST PORT 1 VIEW  Result Date: 12/20/2022 CLINICAL DATA:  Respiratory failure EXAM: PORTABLE CHEST 1 VIEW COMPARISON:  11/28/2021 FINDINGS: Endotracheal tube is seen 6 cm above the carina. Nasoenteric feeding tube extends into the  upper abdomen beyond the margin of the examination. There is paramediastinal soft tissue thickening in keeping with right upper lobe collapse with resultant right-sided volume loss and elevation of the right hemidiaphragm. Left lung is clear. No pneumothorax or pleural effusion. Cardiac size is mildly enlarged, unchanged. Left subclavian single lead pacemaker defibrillator is unchanged. No acute bone abnormality. IMPRESSION: 1. Right upper lobe collapse with resultant right-sided volume loss and elevation of the right hemidiaphragm. 2. Support tubes in expected position. Electronically Signed   By: Helyn Numbers M.D.   On: 12/20/2022 02:22   CT ANGIO HEAD NECK W WO CM  Result Date: 12/19/2022 CLINICAL DATA:  Stroke suspected, atrial fibrillation, dysarthria EXAM: CT ANGIOGRAPHY HEAD AND NECK WITH AND WITHOUT CONTRAST TECHNIQUE: Multidetector CT imaging of the head and neck was performed using the standard protocol during bolus administration of intravenous contrast. Multiplanar CT image reconstructions and MIPs were obtained to evaluate the vascular anatomy. Carotid stenosis measurements (when applicable) are obtained utilizing NASCET criteria, using the distal internal carotid diameter as the denominator. RADIATION DOSE REDUCTION: This exam was performed according to the departmental dose-optimization program which includes automated exposure control, adjustment of the mA and/or kV according to patient size and/or use of iterative reconstruction technique. CONTRAST:  60mL OMNIPAQUE IOHEXOL 350 MG/ML SOLN COMPARISON:  12/18/2022 CT head FINDINGS: CT HEAD FINDINGS Brain: No evidence of acute infarct, hemorrhage, mass, mass effect, or midline shift. No hydrocephalus or extra-axial fluid collection. Periventricular white matter changes, likely the sequela of chronic small vessel ischemic disease. Multiple lacunar infarcts in the bilateral basal ganglia. Vascular: No hyperdense vessel. Atherosclerotic  calcifications in the intracranial carotid and vertebral arteries. Skull: Negative for fracture or focal lesion. Sinuses/Orbits: No acute finding. Other: The mastoid air cells are well aerated. CTA NECK FINDINGS Evaluation is limited by bolus timing and motion. Aortic arch: Standard branching. Imaged portion shows no evidence of aneurysm or dissection. No significant stenosis of the major arch vessel origins. Aortic atherosclerosis. Right carotid system: 75% stenosis in the proximal right ICA (series 7, image 158), with additional 50% stenosis in the more distal right ICA (series 7, image 21). Left carotid system: 60% stenosis in the proximal to mid left ICA (series 7, image 166). Vertebral arteries: Severe stenosis at the origin of right vertebral artery, with moderate stenosis in the right V2 segment (series 7, image 145). No significant stenosis in the extracranial left vertebral artery, although evaluation is significantly limited by bolus timing. Skeleton: No acute osseous abnormality. Degenerative changes in the cervical spine. Poor dentition no acute finding. Nasogastric tube. Other neck: No acute finding. Upper chest: No focal pulmonary opacity or pleural effusion. Review of the MIP images confirms the above findings CTA HEAD FINDINGS Evaluation is significantly limited by bolus timing, with the majority of the contrast in the venous system. Within this limitation, there is occlusion of the mid to distal left V4 (series 7, image 222) and occlusion of the basilar artery (series  7, image 254). The basilar reconstitutes distally, with flow seen in the proximal left and possibly right superior cerebellar artery (series 7, image 263), likely the of the posterior communicating arteries, which are not well seen. The posterior cerebral arteries are not well opacified. Poor opacification of the internal carotid arteries, moderate calcification in the bilateral cavernous and supraclinoid segments. Mild stenosis in the  proximal right A1, with patent bilateral A1 segments and likely patent proximal A2 segments. The left M1 and M2 segments appear patent. The right M1 is not well seen, with likely patent M2 segments. Venous sinuses: Well opacified, patent. Anatomic variants: None significant. Review of the MIP images confirms the above findings IMPRESSION: 1. Evaluation is significantly limited by bolus timing and motion. Recommend repeat CTA of the head. 2. Occlusion of the mid to distal left V4 and basilar artery, with reconstitution of the basilar artery distally, likely from the posterior communicating arteries. 3. 75% stenosis in the proximal right ICA, with additional 50% stenosis in the more distal right ICA. 4. 60% stenosis in the proximal to mid left ICA. 5. Severe stenosis at the origin of the right vertebral artery, with moderate stenosis in the right V2 segment. 6. No acute intracranial process. 7. Aortic atherosclerosis. Aortic Atherosclerosis (ICD10-I70.0). These results will be called to the ordering clinician or representative by the Radiologist Assistant, and communication documented in the PACS or [employer]. Electronically Signed   By: Wiliam Ke M.D.   On: 12/19/2022 20:05   VAS US CAROTID (at [facility] and [facility] only)  Result Date: 12/19/2022 Carotid Arterial Duplex Study Patient Name:  Cody Ortega  Date of Exam:   12/18/2022 Medical Rec #: [id number]         Accession #:    [id number] Date of Birth: 1961-02-25         Patient Gender: M Patient Age:   10 years Exam Location:  [facility] Procedure:      VAS US CAROTID Referring Phys: ERIC CHEN --------------------------------------------------------------------------------  Indications:       Speech disturbance and Weakness. Hypertensive emergency (BP                    187/127 at time of exam) Risk Factors:      Hypertension, coronary artery disease, prior CVA. Other Factors:     CHF, CKD IV, polysubstance abuse, atrial fibrillation, on                     Xarelto. Limitations        Today's exam was limited due to constant movement, patient                    wanting to sit up straight during exam, altered mental                    status, heavy plaquing in bilateral ICAs. Comparison Study:  Prior carotid duplex done at outside facility 11/14/2021                    indicated 50-69% right ICA stenosis by ICA/CCA systolic                    ratio. Performing Technologist: Sherren Kerns RVS  Examination Guidelines: A complete evaluation includes B-mode imaging, spectral Doppler, color Doppler, and power Doppler as needed of all accessible portions of each vessel. Bilateral testing is considered an integral part of a complete  examination. Limited examinations for reoccurring indications may be performed as noted.  Right Carotid Findings: +----------+--------+--------+--------+----------------------+-----------------+           PSV cm/sEDV cm/sStenosisPlaque Description    Comments          +----------+--------+--------+--------+----------------------+-----------------+ CCA Prox  42      9               heterogenous                            +----------+--------+--------+--------+----------------------+-----------------+ CCA Distal40      7               heterogenous                            +----------+--------+--------+--------+----------------------+-----------------+ ICA Prox  192     58      40-59%  calcific and irregularcannot rule out                                                           higher velocities                                                         secondary to                                                              plaque formation                                                          and study                                                                 difficulty        +----------+--------+--------+--------+----------------------+-----------------+ ICA Mid   24       7                                                       +----------+--------+--------+--------+----------------------+-----------------+ ICA Distal23      10                                                      +----------+--------+--------+--------+----------------------+-----------------+  ECA       75      11              calcific                                +----------+--------+--------+--------+----------------------+-----------------+ +----------+--------+-------+--------+-------------------+           PSV cm/sEDV cmsDescribeArm Pressure (mmHG) +----------+--------+-------+--------+-------------------+ Subclavian102                                        +----------+--------+-------+--------+-------------------+ +---------+--------+--+--------+-+ VertebralPSV cm/s45EDV cm/s6 +---------+--------+--+--------+-+  Left Carotid Findings: +----------+--------+--------+--------+----------------------+-----------------+           PSV cm/sEDV cm/sStenosisPlaque Description    Comments          +----------+--------+--------+--------+----------------------+-----------------+ CCA Prox  87      25              heterogenous                            +----------+--------+--------+--------+----------------------+-----------------+ CCA Distal88      32              heterogenous                            +----------+--------+--------+--------+----------------------+-----------------+ ICA Prox  123     43      40-59%  calcific and irregularcannot rule out                                                           higher velocities                                                         secondary to                                                              plaque formation                                                          and study                                                                 difficulty         +----------+--------+--------+--------+----------------------+-----------------+ ICA Mid   116  43              calcific                                +----------+--------+--------+--------+----------------------+-----------------+ ICA Distal132     61              calcific                                +----------+--------+--------+--------+----------------------+-----------------+ ECA       90      5               calcific                                +----------+--------+--------+--------+----------------------+-----------------+ +----------+--------+--------+--------+-------------------+           PSV cm/sEDV cm/sDescribeArm Pressure (mmHG) +----------+--------+--------+--------+-------------------+ [id number]                                          +----------+--------+--------+--------+-------------------+ +---------+--------+--+--------+-+ VertebralPSV cm/s46EDV cm/s5 +---------+--------+--+--------+-+   Summary: Right Carotid: Velocities in the right ICA are consistent with a 40-59%                stenosis. Cannot rule out higher velocities secondary to plaque                formation and study difficulty. Left Carotid: Velocities in the left ICA are consistent with a 40-59% stenosis.               Cannot rule out higher velocities secondary to plaque formation               and study difficulty. Vertebrals:  Bilateral vertebral arteries demonstrate antegrade flow. Subclavians: Normal flow hemodynamics were seen in bilateral subclavian              arteries. *See table(s) above for measurements and observations.  Electronically signed by Delia Heady MD on 12/19/2022 at 6:19:06 PM.    Final    DG Abd Portable 1V  Result Date: 12/19/2022 CLINICAL DATA:  Encounter for feeding tube placement. EXAM: PORTABLE ABDOMEN - 1 VIEW COMPARISON:  None Available. FINDINGS: Tip of the weighted enteric tube is in the left upper quadrant in the region of the mid stomach.  Nonobstructive upper abdominal bowel gas pattern. IMPRESSION: Tip of the weighted enteric tube in the left upper quadrant in the region of the mid stomach. Electronically Signed   By: Narda Rutherford M.D.   On: 12/19/2022 16:05   ECHOCARDIOGRAM COMPLETE BUBBLE STUDY  Result Date: 12/19/2022    ECHOCARDIOGRAM REPORT   Patient Name:   Cody Ortega Date of Exam: 12/19/2022 Medical Rec #:  [id number]        Height:       75.0 in Accession #:    [id number]       Weight:       243.2 lb Date of Birth:  Jul 13, 1960        BSA:          2.384 m Patient Age:    62 years         BP:  207/119 mmHg Patient Gender: M                HR:           79 bpm. Exam Location:  Inpatient Procedure: 2D Echo, Cardiac Doppler, Color Doppler and Saline Contrast Bubble            Study Indications:    stroke  History:        Patient has prior history of Echocardiogram examinations, most                 recent 10/01/2020. CAD, Defibrillator, COPD; Risk                 Factors:Hypertension, Dyslipidemia and Sleep Apnea.  Sonographer:    Delcie Roch RDCS Referring Phys: [phone] ERIC CHEN  Sonographer Comments: Image acquisition challenging due to uncooperative patient. IMPRESSIONS  1. Left ventricular ejection fraction, by estimation, is 20 to 25%. The left ventricle has severely decreased function. The left ventricle demonstrates global hypokinesis. The left ventricular internal cavity size was severely dilated. There is moderate  left ventricular hypertrophy. Left ventricular diastolic parameters are indeterminate.  2. Right ventricular systolic function is normal. The right ventricular size is normal.  3. Left atrial size was moderately dilated.  4. The mitral valve is normal in structure. No evidence of mitral valve regurgitation. No evidence of mitral stenosis.  5. The aortic valve has an indeterminant number of cusps. Aortic valve regurgitation is not visualized. No aortic stenosis is present.  6. Aortic dilatation noted.  There is mild dilatation of the ascending aorta, measuring 40 mm.  7. Agitated saline contrast bubble study was negative, with no evidence of any interatrial shunt. FINDINGS  Left Ventricle: Left ventricular ejection fraction, by estimation, is 20 to 25%. The left ventricle has severely decreased function. The left ventricle demonstrates global hypokinesis. The left ventricular internal cavity size was severely dilated. There is moderate left ventricular hypertrophy. Left ventricular diastolic parameters are indeterminate. Right Ventricle: The right ventricular size is normal. Right ventricular systolic function is normal. Left Atrium: Left atrial size was moderately dilated. Right Atrium: Right atrial size was normal in size. Pericardium: Trivial pericardial effusion is present. Mitral Valve: The mitral valve is normal in structure. Mild mitral annular calcification. No evidence of mitral valve regurgitation. No evidence of mitral valve stenosis. Tricuspid Valve: The tricuspid valve is normal in structure. Tricuspid valve regurgitation is trivial. No evidence of tricuspid stenosis. Aortic Valve: The aortic valve has an indeterminant number of cusps. Aortic valve regurgitation is not visualized. No aortic stenosis is present. Pulmonic Valve: The pulmonic valve was not well visualized. Pulmonic valve regurgitation is not visualized. No evidence of pulmonic stenosis. Aorta: Aortic dilatation noted. There is mild dilatation of the ascending aorta, measuring 40 mm. Venous: The inferior vena cava was not well visualized. IAS/Shunts: No atrial level shunt detected by color flow Doppler. Agitated saline contrast was given intravenously to evaluate for intracardiac shunting. Agitated saline contrast bubble study was negative, with no evidence of any interatrial shunt. Additional Comments: A device lead is visualized.  LEFT VENTRICLE PLAX 2D LVIDd:         6.40 cm      Diastology LVIDs:         5.40 cm      LV e' medial:   4.13 cm/s LV PW:         1.50 cm      LV e' lateral: 4.90 cm/s LV IVS:  1.10 cm LVOT diam:     2.40 cm LV SV:         66 LV SV Index:   28 LVOT Area:     4.52 cm  LV Volumes (MOD) LV vol d, MOD A2C: 156.0 ml LV vol s, MOD A2C: 102.0 ml LV SV MOD A2C:     54.0 ml RIGHT VENTRICLE RV Basal diam:  3.20 cm RV S prime:     13.70 cm/s TAPSE (M-mode): 1.6 cm LEFT ATRIUM              Index        RIGHT ATRIUM           Index LA diam:        4.70 cm  1.97 cm/m   RA Area:     18.10 cm LA Vol (A2C):   90.6 ml  38.00 ml/m  RA Volume:   49.40 ml  20.72 ml/m LA Vol (A4C):   107.0 ml 44.87 ml/m LA Biplane Vol: 103.0 ml 43.20 ml/m  AORTIC VALVE LVOT Vmax:   83.80 cm/s LVOT Vmean:  51.400 cm/s LVOT VTI:    0.146 m  AORTA Ao Root diam: 3.40 cm Ao Asc diam:  4.00 cm  SHUNTS Systemic VTI:  0.15 m Systemic Diam: 2.40 cm Olga Millers MD Electronically signed by Olga Millers MD Signature Date/Time: 12/19/2022/12:13:29 PM    Final    CT HEAD WO CONTRAST  Result Date: 12/18/2022 CLINICAL DATA:  Follow-up examination for code stroke. EXAM: CT HEAD WITHOUT CONTRAST TECHNIQUE: Contiguous axial images were obtained from the base of the skull through the vertex without intravenous contrast. RADIATION DOSE REDUCTION: This exam was performed according to the departmental dose-optimization program which includes automated exposure control, adjustment of the mA and/or kV according to patient size and/or use of iterative reconstruction technique. COMPARISON:  Comparison made with prior CT from 12/17/2022. FINDINGS: Brain: Generalized age-related cerebral atrophy with chronic microvascular ischemic disease. Few small remote lacunar infarcts present about the bilateral basal ganglia. Chronic infarct involving the right parieto-occipital region, stable. No acute intracranial hemorrhage. No visible acute or evolving large vessel territory infarct. No mass lesion, midline shift or mass effect. No hydrocephalus or extra-axial fluid  collection. Vascular: No convincing abnormal hyperdense vessel. Prominent calcified atherosclerosis present at the skull base. Skull: Scalp soft tissues demonstrate no acute finding. Calvarium intact. Sinuses/Orbits: Globes and orbital soft tissues within normal limits. Paranasal sinuses are largely clear. No mastoid effusion. Other: None. IMPRESSION: 1. Stable head CT. No acute intracranial hemorrhage or other abnormality. No visible acute or evolving large vessel territory infarct. 2. Generalized age-related cerebral atrophy with chronic microvascular ischemic disease, with a few small remote lacunar infarcts about the bilateral basal ganglia. 3. Chronic right parieto-occipital infarct, stable. Electronically Signed   By: Rise Mu M.D.   On: 12/18/2022 22:18   CT CERVICAL SPINE WO CONTRAST  Result Date: 12/17/2022 CLINICAL DATA:  Pain EXAM: CT CERVICAL SPINE WITHOUT CONTRAST TECHNIQUE: Multidetector CT imaging of the cervical spine was performed without intravenous contrast. Multiplanar CT image reconstructions were also generated. RADIATION DOSE REDUCTION: This exam was performed according to the departmental dose-optimization program which includes automated exposure control, adjustment of the mA and/or kV according to patient size and/or use of iterative reconstruction technique. COMPARISON:  Cervical spine CT 09/19/2022 FINDINGS: Alignment: Normal. Skull base and vertebrae: No acute fracture. No primary bone lesion or focal pathologic process. Soft tissues and spinal canal: No prevertebral fluid or swelling.  No visible canal hematoma. Disc levels: There is mild disc space narrowing and endplate osteophyte formation at C5-C6 and C6-C7. No significant central canal or neural foraminal stenosis at any level. Upper chest: Negative. Other: There are atherosclerotic calcifications of the bilateral carotid artery bifurcations. IMPRESSION: No acute fracture or traumatic subluxation of the cervical  spine. Electronically Signed   By: Darliss Cheney M.D.   On: 12/17/2022 18:17   CT HEAD CODE STROKE WO CONTRAST  Result Date: 12/17/2022 CLINICAL DATA:  Code stroke. Neuro deficit, acute, stroke suspected. EXAM: CT HEAD WITHOUT CONTRAST TECHNIQUE: Contiguous axial images were obtained from the base of the skull through the vertex without intravenous contrast. RADIATION DOSE REDUCTION: This exam was performed according to the departmental dose-optimization program which includes automated exposure control, adjustment of the mA and/or kV according to patient size and/or use of iterative reconstruction technique. COMPARISON:  Head CT 09/19/2022. FINDINGS: Brain: No acute hemorrhage. 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 No new loss of gray-white differentiation. No hydrocephalus or extra-axial collection. No mass effect or midline shift. Vascular: No hyperdense vessel or unexpected calcification. Skull: No calvarial fracture or suspicious bone lesion. Skull base is unremarkable. Sinuses/Orbits: No acute finding. Other: None. ASPECTS [facility] Stroke Program Early CT Score) - Ganglionic level infarction (caudate, lentiform nuclei, internal capsule, insula, M1-M3 cortex): 7 - Supraganglionic infarction (M4-M6 cortex): 3 Total score (0-10 with 10 being normal): 10 IMPRESSION: 1. No acute intracranial hemorrhage or evidence of acute large vessel territory infarct. ASPECT score is 10. 2. 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 Code stroke imaging results were communicated on 12/17/2022 at 6:06 pm to provider Dr. Selina Cooley via secure text paging. Electronically Signed   By: Orvan Falconer M.D.   On: 12/17/2022 18:06    Labs:  CBC: Recent Labs    09/19/22 0313 12/17/22 1748 12/17/22 1753 12/19/22 0846 12/19/22 2233 12/20/22 0002 12/20/22 0740  WBC 8.4 9.4  --  12.7*  --   --  15.8*  HGB  14.8 16.9   < > 19.0* 18.7* 16.7 17.5*  HCT 44.5 50.5   < > 54.8* 55.0* 49.0 53.3*  PLT 196 170  --  195  --   --  177   < > = values in this interval not displayed.    COAGS: Recent Labs    02/04/22 1403 12/17/22 1748 12/18/22 0730  INR 1.1* 1.0  --   APTT  --  26 38*    BMP: Recent Labs    12/17/22 1748 12/17/22 1753 12/18/22 0730 12/19/22 1106 12/19/22 2233 12/20/22 0002 12/20/22 0740  NA 141 142 141 141 142 141 138  K 3.6 4.5 3.1* 4.1 4.7 4.5 4.1  CL 110 111 109 108  --   --  108  CO2 20*  --  21* 20*  --   --  20*  GLUCOSE 129* 123* 122* 119*  --   --  149*  BUN 19 30* 18 19  --   --  35*  CALCIUM 9.4  --  9.2 9.4  --   --  9.1  CREATININE 1.92* 1.90* 1.79* 1.71*  --   --  2.07*  GFRNONAA 39*  --  43* 45*  --   --  36*    LIVER FUNCTION TESTS: Recent Labs    02/04/22 1403 08/10/22 1430 09/19/22 0313 12/17/22 1748 12/18/22 0730 12/19/22  1106  BILITOT 0.5 0.5 0.5 0.9  --   --   AST 16 11 10* 14*  --   --   ALT 60* 10 12 13   --   --   ALKPHOS 62 65 52 57  --   --   PROT 6.9 7.1 7.2 6.8  --   --   ALBUMIN 4.0 4.2 4.1 3.9 3.7 3.8    Assessment and Plan:  62 y.o. male with CAD, CHF, CKD stage IV, HL, HTN, a fib on xarelto, pacemaker in place, prior stroke who presents to ED on 8/17 with L sided weakness and dysarthria. CTA H/N negative for LVO, Unchanged old infarct in the right parietal lobe and lacunar infarcts in the bilateral basal ganglia. Repeat CTA H/N on 8/19 showed worsening existing occlusion, NIR was consulted for emergent intervention. Patient underwent mechanical thrombectomy, stent placement, and right PCA coli embo, angioplasty for severe stenosis of the basilar artery  on 8/20 AM by Dr. Joana Reamer Rorigues., CT showed small amount of hyperdensity in the posterior fossa, consistent with contrast + SAH. No hydrocephalus.  Patient remains intubated and sedated, he was posturing when sedation weaned off.  CT head performed this morning, reviewed by  Dr. Joana Reamer Rorigues, concerning for brainstem infarct.  R CFA puncture site with no appreciable pseudoaneurysm, R DP dopplerable per RN.   Further treatment plan per Stroke team/ PCCM Appreciate and agree with the plan.  Please call NIR for questions and concerns, no routine follow up planned.   Electronically Signed: Willette Brace, PA-C 12/20/2022, 11:56 AM   I spent a total of 15 Minutes at the the patient's bedside AND on the patient's hospital floor or unit, greater than 50% of which was counseling/coordinating care for code stroke f/u.   This chart was dictated using voice recognition software.  Despite best efforts to proofread,  errors can occur which can change the documentation meaning.

## 2022-12-20 NOTE — Progress Notes (Signed)
0500 Report called to 4N RN Zollie Scale. Pt had a bowel movement just prior to transfer to ICU. Patient was cleaned, linens and gown changed prior to transfer. Patient transported out of PACU at 0521 to 4N30 by myself, Vincent Gros, RN and Kearney Hard RRT. Pt tolerated transportation to 4N and transfer to bed without complications. ICU team at bedside upon arrival to unit.

## 2022-12-20 NOTE — Consult Note (Signed)
NAME:  Cody Ortega, MRN:  [id number], DOB:  [birth year], LOS: 2 ADMISSION DATE:  12/17/2022, CONSULTATION DATE:  8/20 REFERRING MD:  Dr. Janalyn Shy, CHIEF COMPLAINT:  CVA   History of Present Illness:  62 year old male with past medical history as below, which is significant for asthma, CAD, HFrEF, CKD, substance abuse history, hypertension, sleep apnea, atrial fibrillation on Xarelto, and AICD in place.  He presented to [facility] emergency department on 8/17 following a fall.  He initially refused EMS transport but continued to have weakness in all 4 extremities as well as dysarthria so he agreed to come to the ED.  Code stroke was activated upon arrival to the ED, however, he was outside the window for any thrombolytic.  Presents sensation thought to be secondary to small stroke.  Symptoms not felt to be likely with LVO.  He was admitted to the hospitalist service with stroke service following.  In the evening hours of 8/19 he was found to have a worsening neuroexam.  NIH worsened to 22 and interventional radiology was activated.  Prior to the interventional procedure the patient was desaturating and was ultimately intubated.  O2 sats were in the low 80s prior to intubation.  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  Contrast extravasation was noted from the right PCA and was treated with coils.  Additional balloon angioplasty to the basilar artery.  Post procedurally he remained on mechanical ventilator and was transferred to the ICU for further monitoring.  PCCM asked to evaluate for ICU care.  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   Significant Hospital Events: Including procedures, antibiotic start and stop dates in addition to other pertinent events   8/17 admit with CVA. Outside window. Not felt to be amenable to IR 8/19 Symptoms worse. To IR. Basilar occlusion s/p thrombectomy, angioplasty. Complicated by contrast extravasation  Interim History / Subjective:    Objective   Blood pressure (!) 125/90, pulse 72, temperature (!) 97.2 F (36.2 C), resp. rate 18, height 6\' 3"  (1.905 m), weight 110.3 kg, SpO2 95%.        Intake/Output Summary (Last 24 hours) at 12/20/2022 0213 Last data filed at 12/20/2022 0054 Gross per 24 hour  Intake 2010.54 ml  Output 800 ml  Net 1210.54 ml   Filed Weights   12/17/22 1754 12/19/22 0540  Weight: 114.4 kg 110.3 kg    Examination: General: Middle aged male on vent HENT: Nelson/AT, no JVD. Pupils 8mm and fixed bilaterally.  Lungs: Diminished on the right. Clear on the left. Triggering vent spontaneously Cardiovascular: RRR, no MRG Abdomen: Soft, NT, ND Extremities: No acute deformity or ROM limitation Neuro: prop off 10 mins. + gag. No response to pain in lowers. Full body twitch to pain in upper extremities.     Resolved Hospital Problem list     Assessment & Plan:   Acute CVA; cardioembolic , non-compliant with Xarelto Mid basilar occlusion s/p mechanical thrombectomy and balloon angioplasty complicated by contrast extravasation +/-  SAH - Management per neurology - Pupils 8 mm and fixed bilaterally. For repeat CT head STAT - Keep SBP 120-117mmHg. Currently requiring NE - ASA  Acute hypoxemic respiratory failure -Full vent supoprt -VAP  bundle -Repeat ABG -CXR in AM -SAT/SBT when meets criteria  HFrEF: LVEF 20-25% Atrial fibrillation on Xarelto CAD HTN - resume AC per stroke service. Heparin - Telemetry monitoring - Continue GDMT as BP will tolerate  CKD III - Trend BMP and UOP  COPD OSA - nebulized bronchodilators  Nutrition Dysphagia -TF via cortrak  Substance abuse - THC and benzo   Best Practice (right click and "Reselect all SmartList Selections" daily)   Diet/type: NPO DVT prophylaxis: systemic heparin GI prophylaxis: H2B Lines: N/A Foley:  N/A Code Status:  full code Last date of multidisciplinary goals of care discussion [ ]   Labs   CBC: Recent Labs  Lab 12/17/22 1748 12/17/22 1753 12/19/22 0846 12/19/22 2233 12/20/22 0002  WBC 9.4  --  12.7*  --   --   NEUTROABS 7.5  --   --   --   --   HGB 16.9 17.7* 19.0* 18.7* 16.7  HCT 50.5 52.0 54.8* 55.0* 49.0  MCV 90.7  --  90.4  --   --   PLT 170  --  195  --   --     Basic Metabolic Panel: Recent Labs  Lab 12/17/22 1748 12/17/22 1753 12/18/22 0730 12/19/22 1106 12/19/22 2233 12/20/22 0002  NA 141 142 141 141 142 141  K 3.6 4.5 3.1* 4.1 4.7 4.5  CL 110 111 109 108  --   --   CO2 20*  --  21* 20*  --   --   GLUCOSE 129* 123* 122* 119*  --   --   BUN 19 30* 18 19  --   --   CREATININE 1.92* 1.90* 1.79* 1.71*  --   --   CALCIUM 9.4  --  9.2 9.4  --   --   MG  --   --  2.0 1.9  --   --   PHOS  --   --  2.8 2.6  --   --    GFR: Estimated Creatinine Clearance: 61.6 mL/min (A) (by C-G formula based on SCr of 1.71 mg/dL (H)). Recent Labs  Lab 12/17/22 1748 12/17/22 1812 12/19/22 0846  WBC 9.4  --  12.7*  LATICACIDVEN  --  1.2  --     Liver Function Tests: Recent Labs  Lab 12/17/22 1748 12/18/22 0730 12/19/22 1106  AST 14*  --   --   ALT 13  --   --   ALKPHOS 57  --   --   BILITOT 0.9  --   --   PROT 6.8  --   --   ALBUMIN 3.9 3.7 3.8   No results for input(s): "LIPASE", "AMYLASE" in the last 168  hours. Recent Labs  Lab 12/19/22 1656  AMMONIA 32    ABG    Component Value Date/Time   PHART 7.287 (L) 12/20/2022 0002   PCO2ART 45.7 12/20/2022 0002   PO2ART 135 (H) 12/20/2022 0002   HCO3 21.8 12/20/2022 0002   TCO2 23 12/20/2022 0002   ACIDBASEDEF 5.0 (H) 12/20/2022 0002   O2SAT 99 12/20/2022 0002     Coagulation Profile: Recent Labs  Lab 12/17/22 1748  INR 1.0    Cardiac Enzymes: No results for input(s): "CKTOTAL", "CKMB", "CKMBINDEX", "TROPONINI" in the last 168 hours.  HbA1C: Hgb A1c MFr Bld  Date/Time Value Ref Range Status  12/18/2022 01:14 AM 5.5 4.8 - 5.6 % Final    Comment:    (NOTE) Pre diabetes:          5.7%-6.4%  Diabetes:              >6.4%  Glycemic control for   <7.0% adults with diabetes   12/09/2020 02:58 PM 5.6 4.8 - 5.6 % Final    Comment:             Prediabetes: 5.7 - 6.4          Diabetes: >6.4          Glycemic control for adults with diabetes: <7.0     CBG: Recent Labs  Lab 12/17/22 1751 12/19/22 1801 12/19/22 2044 12/20/22 0116  GLUCAP 113* 152* 159* 170*    Review of Systems:   Patient is encephalopathic and/or intubated; therefore, history has been obtained from chart review.    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   Surgical History:   Past Surgical History:  Procedure Laterality Date    CARDIOVERSION N/A 01/07/2014   Procedure: CARDIOVERSION;  Surgeon: Lewayne Bunting, MD;  Location: [facility] ENDOSCOPY;  Service: Cardiovascular;  Laterality: N/A;   CARDIOVERSION N/A 02/11/2014   Procedure: CARDIOVERSION;  Surgeon: Chrystie Nose, MD;  Location: [facility] ENDOSCOPY;  Service: Cardiovascular;  Laterality: N/A;   CARDIOVERSION N/A 10/01/2020   Procedure: CARDIOVERSION;  Surgeon: Christell Constant, MD;  Location: [facility] ENDOSCOPY;  Service: Cardiovascular;  Laterality: N/A;   CATARACT EXTRACTION     COLONOSCOPY WITH PROPOFOL N/A 02/06/2018   Procedure: COLONOSCOPY WITH PROPOFOL;  Surgeon: Benancio Deeds, MD;  Location: [facility] ENDOSCOPY;  Service: Gastroenterology;  Laterality: N/A;   EYE SURGERY     ICD GENERATOR CHANGEOUT N/A 01/17/2020   Procedure: ICD GENERATOR CHANGEOUT;  Surgeon: Marinus Maw, MD;  Location: [facility] INVASIVE CV LAB;  Service: Cardiovascular;  Laterality: N/A;   LASEK eye surgery Bilateral 05/2018   LASIK Bilateral    PACEMAKER INSERTION     POLYPECTOMY  02/06/2018   Procedure: POLYPECTOMY;  Surgeon: Benancio Deeds, MD;  Location: [facility] ENDOSCOPY;  Service: Gastroenterology;;   TEE WITHOUT CARDIOVERSION N/A 01/07/2014   Procedure: TRANSESOPHAGEAL ECHOCARDIOGRAM (TEE);  Surgeon: Lewayne Bunting, MD;  Location: [facility] ENDOSCOPY;  Service: Cardiovascular;  Laterality: N/A;   TEE WITHOUT CARDIOVERSION N/A 02/11/2014   Procedure: TRANSESOPHAGEAL ECHOCARDIOGRAM (TEE);  Surgeon: Chrystie Nose, MD;  Location: [facility] ENDOSCOPY;  Service: Cardiovascular;  Laterality: N/A;   TEE WITHOUT CARDIOVERSION N/A 10/01/2020   Procedure: TRANSESOPHAGEAL ECHOCARDIOGRAM (TEE);  Surgeon: Christell Constant, MD;  Location: [facility] ENDOSCOPY;  Service: Cardiovascular;  Laterality: N/A;   TONSILLECTOMY     TRANSTHORACIC ECHOCARDIOGRAM  10/2006, 12/2009     Social History:   reports that he quit smoking about 12 years ago. His smoking use included cigarettes. He started smoking about 13  years ago. He has a 0.2 pack-year smoking history. He has never used smokeless tobacco. He  reports that he does not currently use alcohol. He reports that he does not use drugs.   Family History:  His family history includes Breast cancer in his mother; Cerebral palsy in his daughter; Diabetes in his father; Multiple myeloma in his mother; Peripheral vascular disease in his father; Prostate cancer in his father. There is no history of Lung disease, Colon cancer, Stomach cancer, Esophageal cancer, or Colon polyps.   Allergies Allergies  Allergen Reactions   Prochlorperazine Other (See Comments)    Medication caused a lot of issues like blurred vision, nausea, pain, and carpool turnel   Codeine Itching, Nausea And Vomiting and Hives   Hydrocodone-Acetaminophen Itching and Nausea And Vomiting     Home Medications  Prior to Admission medications   Medication Sig Start Date End Date Taking? Authorizing Provider  acetaminophen (TYLENOL) 500 MG tablet Take 1,000 mg by mouth every 6 (six) hours as needed for moderate pain or headache.   Yes [provider]  albuterol (VENTOLIN HFA) 108 (90 Base) MCG/ACT inhaler INHALE 2 PUFFS INTO THE LUNGS EVERY 6 HOURS AS NEEDED FOR WHEEZING OR SHORTNESS OF BREATH 12/13/22  Yes Marcine Matar, MD  alprazolam Prudy Feeler) 2 MG tablet Take 2 mg by mouth daily as needed for anxiety.   Yes [provider]  Artificial Tear Solution (TEARS NATURALE OP) Place 1 drop into both eyes daily as needed (dry eyes).   Yes [provider]  aspirin EC 81 MG tablet Take 81 mg by mouth daily. Swallow whole.   Yes [provider]  atorvastatin (LIPITOR) 10 MG tablet TAKE 1 TABLET(10 MG) BY MOUTH DAILY 11/21/22  Yes Tonny Bollman, MD  carvedilol (COREG) 12.5 MG tablet TAKE 1 AND 1/2 TABLETS BY MOUTH TWICE DAILY 12/13/22  Yes Marcine Matar, MD  dapagliflozin propanediol (FARXIGA) 10 MG TABS tablet Take 1 tablet (10 mg total) by mouth daily before  breakfast. 04/14/22  Yes Tonny Bollman, MD  entecavir (BARACLUDE) 0.5 MG tablet Take 1 tablet (0.5 mg total) by mouth every other day. 08/16/22  Yes Armbruster, Willaim Rayas, MD  ENTRESTO 49-51 MG TAKE 1 TABLET BY MOUTH TWICE DAILY 02/15/22  Yes Tonny Bollman, MD  ezetimibe (ZETIA) 10 MG tablet TAKE 1 TABLET(10 MG) BY MOUTH DAILY 08/29/22  Yes Tonny Bollman, MD  furosemide (LASIX) 40 MG tablet TAKE 1/2 TABLET(20 MG) BY MOUTH DAILY 12/29/21  Yes Tonny Bollman, MD  hydrALAZINE (APRESOLINE) 100 MG tablet TAKE 1 TABLET(100 MG) BY MOUTH IN THE MORNING AND AT BEDTIME 04/26/22  Yes Nahser, Deloris Ping, MD  ondansetron (ZOFRAN-ODT) 8 MG disintegrating tablet Take 8 mg by mouth daily. 12/11/19  Yes [provider]  Rectal Protectant-Emollient (CALMOL-4) 76-10 % SUPP Use as directed once to twice daily 04/08/22  Yes Armbruster, Willaim Rayas, MD  RESTASIS 0.05 % ophthalmic emulsion Place 1 drop into both eyes in the morning. 11/25/21  Yes [provider]  sildenafil (REVATIO) 20 MG tablet Take 1 tablet (20 mg total) by mouth as needed. 12/03/21  Yes Tonny Bollman, MD  SYMBICORT 160-4.5 MCG/ACT inhaler INHALE 2 PUFFS INTO THE LUNGS TWICE DAILY 11/10/21  Yes Marcine Matar, MD  tamsulosin (FLOMAX) 0.4 MG CAPS capsule TAKE 1 CAPSULE(0.4 MG) BY MOUTH DAILY 10/25/22  Yes Marcine Matar, MD  XARELTO 20 MG TABS tablet TAKE 1 TABLET(20 MG) BY MOUTH DAILY WITH SUPPER 09/28/22  Yes Camnitz, Will Daphine Deutscher, MD  amiodarone (PACERONE) 200 MG tablet Take 1 tablet (200 mg total) by mouth daily. Patient  not taking: Reported on 12/17/2022 12/09/21   Regan Lemming, MD  DULoxetine (CYMBALTA) 30 MG capsule Take 90 mg by mouth daily with breakfast. Patient not taking: Reported on 12/17/2022    [provider]  fluticasone (FLONASE) 50 MCG/ACT nasal spray SHAKE LIQUID AND USE 1 SPRAY IN [facility] NOSTRIL DAILY Patient not taking: Reported on 12/17/2022 12/13/22   Marcine Matar, MD  polyethylene glycol  (MIRALAX) 17 g packet Take 17 g by mouth 2 (two) times daily. Take as directed, Daily, titrate as needed Patient not taking: Reported on 12/17/2022 04/08/22   Benancio Deeds, MD  zaleplon (SONATA) 10 MG capsule Take 10 mg by mouth at bedtime. Patient not taking: Reported on 12/17/2022 09/20/22   [provider]     Critical care time: 39 minutes     Joneen Roach, AGACNP-BC [facility]  See Amion for personal pager PCCM on call pager [phone] until 7pm. Please call Elink 7p-7a. [phone]  12/20/2022 3:19 AM

## 2022-12-20 NOTE — Progress Notes (Signed)
Notified by Dr. Judeth Horn that the patient has non reactive pupils. Given SAH in the posterior fossa - will order repeat head CT now and not wait till 0500 as previously planned by Dr. Nyra Jabs. Will follow CTH and exam.  -- Milon Dikes, MD Neurologist [facility] Pager: [phone]

## 2022-12-20 NOTE — Progress Notes (Signed)
Pharmacy Antibiotic Note  Cody Ortega is a 62 y.o. male admitted on 12/17/2022 with  stroke now concern for ASA PNA .  Pharmacy has been consulted for Unasyn dosing.  Plan: Unasyn 3g Q6H.  Follow culture data for de-escalation.  Monitor renal function for dose adjustments as indicated.   Height: 6\' 3"  (190.5 cm) Weight: 113.6 kg (250 lb 7.1 oz) IBW/kg (Calculated) : 84.5  Temp (24hrs), Avg:97.8 F (36.6 C), Min:97.2 F (36.2 C), Max:98.4 F (36.9 C)  Recent Labs  Lab 12/17/22 1748 12/17/22 1753 12/17/22 1812 12/18/22 0730 12/19/22 0846 12/19/22 1106 12/20/22 0740  WBC 9.4  --   --   --  12.7*  --  15.8*  CREATININE 1.92* 1.90*  --  1.79*  --  1.71*  --   LATICACIDVEN  --   --  1.2  --   --   --   --     Estimated Creatinine Clearance: 62.4 mL/min (A) (by C-G formula based on SCr of 1.71 mg/dL (H)).    Allergies  Allergen Reactions   Prochlorperazine Other (See Comments)    Medication caused a lot of issues like blurred vision, nausea, pain, and carpool turnel   Codeine Itching, Nausea And Vomiting and Hives   Hydrocodone-Acetaminophen Itching and Nausea And Vomiting    Thank you for allowing pharmacy to be a part of this patient's care.  Estill Batten, PharmD, BCCCP  12/20/2022 8:24 AM

## 2022-12-20 NOTE — Progress Notes (Signed)
Initial Nutrition Assessment  DOCUMENTATION CODES:   Non-severe (moderate) malnutrition in context of chronic illness  INTERVENTION:   Start Jevity 1.5 @ 30 ml/hr and advance rate by 10 ml every 6 hours to goal rate of 60 ml/hr (1440 ml/day) - PROSource TF20 60 ml BID  Tube feeding regimen at goal rate provides 2320 kcal, 132 grams of protein, and 1094 ml of H2O.  NUTRITION DIAGNOSIS:   Moderate Malnutrition related to chronic illness (CHF, CKD IIIb, COPD, cirrhosis) as evidenced by mild muscle depletion, percent weight loss (11.5% weight loss in 3 months).  GOAL:   Patient will meet greater than or equal to 90% of their needs  MONITOR:   Diet advancement, Labs, Weight trends, TF tolerance  REASON FOR ASSESSMENT:   Consult Enteral/tube feeding initiation and management  ASSESSMENT:   62 year old male who presented to the ED on 8/17 with left-sided weakness. 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 Pt admitted with CVA.  08/18 - SLP recommending NPO 08/19 - Cortrak placed  Per Xray, "Nasoenteric feeding tube extends into the upper abdomen beyond the margin of the Examination"  Consult received for enteral nutrition initiation and management. SLP continues to recommend NPO. Cortrak placed yesterday.   Wife at bedside. RN at bedside giving care.   Patient is sedated and intubated today. He is no longer on pressors or propofol   OK to start tube feeds.   Labs: CBG 139, BUN 35, Cr 2.07 Meds: Unasyn, lipitor, colace, pepcid, sliding scale insulin  Continuous drips: Cleviprex, NS    I/O's: +1.4 L since admission   Diet Order:   Diet Order             Diet NPO time specified  Diet effective now                   EDUCATION NEEDS:   Education needs have been addressed  Skin:  Skin Assessment: Reviewed RN Assessment  Last BM:   12/17/22  Height:   Ht Readings from Last 1 Encounters:  12/20/22 6\' 3"  (1.905 m)    Weight:   Wt Readings from Last 1 Encounters:  12/20/22 113.6 kg    Ideal Body Weight:  89.1 kg  BMI:  Body mass index is 31.3 kg/m.  Estimated Nutritional Needs:   Kcal:  2300-2500  Protein:  120-135 grams  Fluid:  >2.0 L    Leodis Rains, RDN, LDN  Clinical Nutrition

## 2022-12-20 NOTE — Plan of Care (Signed)
Seen and examined in PACU. On vent, propofol Breathing over vent. Pupils 6mm fixed b/l. No movement to nox stim. Corneals present b/l - brisker on right than on left. CTH reviewed - unchanged in comparison to the IR flat panel CT with stable SAH vs contrast in the subarachnoid spaces in right greater than left occipital and cerebellar lobes as well as inferomedial parietal lobes. Continue post IR care. Appreciate PCCM eval and management. Stroke team to follow.  -- Milon Dikes, MD Neurologist [facility] Pager: [phone]

## 2022-12-20 NOTE — Progress Notes (Signed)
OT Cancellation Note  Patient Details Name: BEKA MIKULAK MRN: [id number] DOB: 05/16/1960   Cancelled Treatment:    Reason Eval/Treat Not Completed: Patient not medically ready- pt with change in medical status, now in ICU intubated and sedated.  Spoke to RN, will sign off at this time and when/if appropriate please re-order.   Barry Brunner, OT Acute Rehabilitation Services Office [phone]   Chancy Milroy 12/20/2022, 9:00 AM

## 2022-12-20 NOTE — Progress Notes (Signed)
Transferred patient from PACU to 4N30 without any complications.

## 2022-12-20 NOTE — Progress Notes (Signed)
Transferred patient to CT and back to PACU without any complications.

## 2022-12-20 NOTE — Sedation Documentation (Signed)
Md states no foley ordered

## 2022-12-20 NOTE — Progress Notes (Signed)
Physical Therapy Discharge Patient Details Name: BENGY LESKO MRN: [id number] DOB: 09/08/1960 Today's Date: 12/20/2022 Time:  -     Patient discharged from PT services secondary to medical decline - will need to re-order PT to resume therapy services.  Please see latest therapy progress note for current level of functioning and progress toward goals.    Progress and discharge plan discussed with patient and/or caregiver: Patient unable to participate in discharge planning and no caregivers available     Lyanne Co, PT  Acute Rehab Services Secure chat preferred Office [phone]   Lawana Chambers Kamsiyochukwu Spickler 12/20/2022, 10:08 AM

## 2022-12-20 NOTE — Progress Notes (Addendum)
STROKE TEAM PROGRESS NOTE   BRIEF HPI This is a 62 yo man with pmhx significant for CAD, CHF, CKD stage IV, HL, HTN, a fib on xarelto, prior stroke who presents with L sided weakness and dysarthria. Outside of window for TNK and no LVO for thrombectomy. Nonadherent to zarelto but could not get exact reason due to dysarthria.    SIGNIFICANT HOSPITAL EVENTS 8/17 evening: presented to ED 8/19: new neurologic sx, CTA basilar occlusion requiring thrombectomy complicated by R PCA perforation requiring coil embolization and posterior fossa with residual contrast and/or SAH.   INTERIM HISTORY/SUBJECTIVE Pt is s/p IR procedure yesterday and is intubated and sedated with propofol 6 and on mild pressure support to maintain 120-160 SBP. Partner is in the room. Reported the poor prognosis to family including the likelihood that he may not survive given the brainstem involvement and loss of primitive reflexes and posturing. Partner got in contact with the son, and he will be there at 5pm.  Per nursing, blood pressure has been near the 200s when not on propofol, so she continued propofol and using levophed to keep pressure at 120. Reported that his LOC is low even without sedation. Asked nursing to wean sedation to reassess patients neurologic status.     OBJECTIVE  CBC    Component Value Date/Time   WBC 15.8 (H) 12/20/2022 0740   RBC 5.81 12/20/2022 0740   HGB 17.5 (H) 12/20/2022 0740   HGB 16.0 06/02/2022 1426   HCT 53.3 (H) 12/20/2022 0740   HCT 46.7 06/02/2022 1426   PLT 177 12/20/2022 0740   PLT 185 06/02/2022 1426   MCV 91.7 12/20/2022 0740   MCV 91 06/02/2022 1426   MCH 30.1 12/20/2022 0740   MCHC 32.8 12/20/2022 0740   RDW 14.1 12/20/2022 0740   RDW 15.1 06/02/2022 1426   LYMPHSABS 1.1 12/17/2022 1748   LYMPHSABS 0.8 10/15/2020 1118   MONOABS 0.6 12/17/2022 1748   EOSABS 0.1 12/17/2022 1748   EOSABS 0.1 10/15/2020 1118   BASOSABS 0.1 12/17/2022 1748   BASOSABS 0.0 10/15/2020 1118     BMET    Component Value Date/Time   NA 138 12/20/2022 0740   NA 133 (L) 06/02/2022 1426   K 4.1 12/20/2022 0740   CL 108 12/20/2022 0740   CO2 20 (L) 12/20/2022 0740   GLUCOSE 149 (H) 12/20/2022 0740   BUN 35 (H) 12/20/2022 0740   BUN 26 (H) 06/02/2022 1426   CREATININE 2.07 (H) 12/20/2022 0740   CREATININE 1.38 (H) 02/08/2016 1155   CALCIUM 9.1 12/20/2022 0740   EGFR 29 (L) 06/02/2022 1426   GFRNONAA 36 (L) 12/20/2022 0740    IMAGING past 24 hours Portable Chest xray  Result Date: 12/20/2022 CLINICAL DATA:  History of stroke and right lung atelectasis EXAM: PORTABLE CHEST 1 VIEW COMPARISON:  Chest radiograph dated 12/20/2022 at 1:38 a.m. FINDINGS: Lines/tubes: Endotracheal tube tip projects 6.1 cm above the carina Enteric tube tip reaches the diaphragm and terminates below the field of view. Left chest wall ICD lead projects over the right ventricle. Lungs: Slightly increased right lung volume loss with right upper lobe atelectasis. Pleura: No pneumothorax or pleural effusion. Heart/mediastinum: Similar mildly enlarged cardiomediastinal silhouette. Bones: No acute osseous abnormality. IMPRESSION: 1. Slightly increased right lung volume loss with right upper lobe atelectasis. 2. Endotracheal tube tip projects 6.1 cm above the carina. Electronically Signed   By: Agustin Cree M.D.   On: 12/20/2022 07:49   CT HEAD WO CONTRAST ( )  Result  Date: 12/20/2022 CLINICAL DATA:  Status post thrombectomy EXAM: CT HEAD WITHOUT CONTRAST TECHNIQUE: Contiguous axial images were obtained from the base of the skull through the vertex without intravenous contrast. RADIATION DOSE REDUCTION: This exam was performed according to the departmental dose-optimization program which includes automated exposure control, adjustment of the mA and/or kV according to patient size and/or use of iterative reconstruction technique. COMPARISON:  12/19/2022 CTA head and neck, intra procedural flat panel CT. FINDINGS: Brain:  Hyperdense material in the subarachnoid spaces of the inferomedial parietal lobes, right greater than left occipital lobes, and right greater than left cerebellar hemispheres, which roughly correlates with the area of hyperdensity on the prior CT, favored to represent contrast staining and a component of subarachnoid hemorrhage. No evidence of mass, mass effect, or midline shift. No hydrocephalus. Redemonstrated hypodensity in the right parieto-occipital region. Vascular: Atherosclerotic calcifications in the intracranial carotid and vertebral arteries. Contrast is noted in the vasculature. Interval placement of coils in the proximal right PCA territory. Skull: Negative for fracture or focal lesion. Sinuses/Orbits: Mild mucosal thickening in the ethmoid air cells. Partially imaged nasogastric tube. Other: The mastoid air cells are well aerated. IMPRESSION: Hyperdense material in the subarachnoid spaces of the inferomedial parietal lobes, right greater than left occipital lobes, and right greater than left cerebellar hemispheres, favored to represent contrast staining and a component of subarachnoid hemorrhage, which appears roughly unchanged from the intra-procedural CT. Electronically Signed   By: Wiliam Ke M.D.   On: 12/20/2022 03:45   DG CHEST PORT 1 VIEW  Result Date: 12/20/2022 CLINICAL DATA:  Respiratory failure EXAM: PORTABLE CHEST 1 VIEW COMPARISON:  11/28/2021 FINDINGS: Endotracheal tube is seen 6 cm above the carina. Nasoenteric feeding tube extends into the upper abdomen beyond the margin of the examination. There is paramediastinal soft tissue thickening in keeping with right upper lobe collapse with resultant right-sided volume loss and elevation of the right hemidiaphragm. Left lung is clear. No pneumothorax or pleural effusion. Cardiac size is mildly enlarged, unchanged. Left subclavian single lead pacemaker defibrillator is unchanged. No acute bone abnormality. IMPRESSION: 1. Right upper lobe  collapse with resultant right-sided volume loss and elevation of the right hemidiaphragm. 2. Support tubes in expected position. Electronically Signed   By: Helyn Numbers M.D.   On: 12/20/2022 02:22   CT ANGIO HEAD NECK W WO CM  Result Date: 12/19/2022 CLINICAL DATA:  Stroke suspected, atrial fibrillation, dysarthria EXAM: CT ANGIOGRAPHY HEAD AND NECK WITH AND WITHOUT CONTRAST TECHNIQUE: Multidetector CT imaging of the head and neck was performed using the standard protocol during bolus administration of intravenous contrast. Multiplanar CT image reconstructions and MIPs were obtained to evaluate the vascular anatomy. Carotid stenosis measurements (when applicable) are obtained utilizing NASCET criteria, using the distal internal carotid diameter as the denominator. RADIATION DOSE REDUCTION: This exam was performed according to the departmental dose-optimization program which includes automated exposure control, adjustment of the mA and/or kV according to patient size and/or use of iterative reconstruction technique. CONTRAST:  60mL OMNIPAQUE IOHEXOL 350 MG/ML SOLN COMPARISON:  12/18/2022 CT head FINDINGS: CT HEAD FINDINGS Brain: No evidence of acute infarct, hemorrhage, mass, mass effect, or midline shift. No hydrocephalus or extra-axial fluid collection. Periventricular white matter changes, likely the sequela of chronic small vessel ischemic disease. Multiple lacunar infarcts in the bilateral basal ganglia. Vascular: No hyperdense vessel. Atherosclerotic calcifications in the intracranial carotid and vertebral arteries. Skull: Negative for fracture or focal lesion. Sinuses/Orbits: No acute finding. Other: The mastoid air cells are well  aerated. CTA NECK FINDINGS Evaluation is limited by bolus timing and motion. Aortic arch: Standard branching. Imaged portion shows no evidence of aneurysm or dissection. No significant stenosis of the major arch vessel origins. Aortic atherosclerosis. Right carotid system: 75%  stenosis in the proximal right ICA (series 7, image 158), with additional 50% stenosis in the more distal right ICA (series 7, image 21). Left carotid system: 60% stenosis in the proximal to mid left ICA (series 7, image 166). Vertebral arteries: Severe stenosis at the origin of right vertebral artery, with moderate stenosis in the right V2 segment (series 7, image 145). No significant stenosis in the extracranial left vertebral artery, although evaluation is significantly limited by bolus timing. Skeleton: No acute osseous abnormality. Degenerative changes in the cervical spine. Poor dentition no acute finding. Nasogastric tube. Other neck: No acute finding. Upper chest: No focal pulmonary opacity or pleural effusion. Review of the MIP images confirms the above findings CTA HEAD FINDINGS Evaluation is significantly limited by bolus timing, with the majority of the contrast in the venous system. Within this limitation, there is occlusion of the mid to distal left V4 (series 7, image 222) and occlusion of the basilar artery (series 7, image 254). The basilar reconstitutes distally, with flow seen in the proximal left and possibly right superior cerebellar artery (series 7, image 263), likely the of the posterior communicating arteries, which are not well seen. The posterior cerebral arteries are not well opacified. Poor opacification of the internal carotid arteries, moderate calcification in the bilateral cavernous and supraclinoid segments. Mild stenosis in the proximal right A1, with patent bilateral A1 segments and likely patent proximal A2 segments. The left M1 and M2 segments appear patent. The right M1 is not well seen, with likely patent M2 segments. Venous sinuses: Well opacified, patent. Anatomic variants: None significant. Review of the MIP images confirms the above findings IMPRESSION: 1. Evaluation is significantly limited by bolus timing and motion. Recommend repeat CTA of the head. 2. Occlusion of the  mid to distal left V4 and basilar artery, with reconstitution of the basilar artery distally, likely from the posterior communicating arteries. 3. 75% stenosis in the proximal right ICA, with additional 50% stenosis in the more distal right ICA. 4. 60% stenosis in the proximal to mid left ICA. 5. Severe stenosis at the origin of the right vertebral artery, with moderate stenosis in the right V2 segment. 6. No acute intracranial process. 7. Aortic atherosclerosis. Aortic Atherosclerosis (ICD10-I70.0). These results will be called to the ordering clinician or representative by the Radiologist Assistant, and communication documented in the PACS or [employer]. Electronically Signed   By: Wiliam Ke M.D.   On: 12/19/2022 20:05    Vitals:   12/20/22 1400 12/20/22 1415 12/20/22 1430 12/20/22 1445  BP: 133/87 (!) 135/97 115/80 115/86  Pulse: 65 68 61 64  Resp: (!) 22 (!) 22 19 (!) 22  Temp:      TempSrc:      SpO2: 96% 97% 94% 94%  Weight:      Height:         PHYSICAL EXAM General:  intubated and sedated and nonresponsive.  Respiratory:  synchronous with the ventilator.   NEURO:  Mental Status: stupor, does not open eyes to sternal rub. Withdrawals all extremities to pain.  CN: pupils dilated and nonreactive bilaterally. absent oculocephalic reflex. Absent corneal reflex bilaterally. Gag intact to endotracheal tube suction.    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 ASPECTS 10.    CT cervical spine no acute fracture or subluxation  CT head 24hr p/ost stroke stable, unchanged Not able to have MRI due to generator and leads are from different providers CTA head neck 8/19 1835 occluded basilar artery S/p IR with BA recanalization, However,  contrast extravasation was noted from the right PCA with was treated with coils + n-BCA. Underlying severe stenosis of the basilar artery was treated with balloon angioplasty.  CT head s/p IR 8/20 0300 hyperdense material in the subarachnoid spaces of the inferomedial parietal lobes, right greater than left occipital lobes, and right greater than left cerebellar hemispheres, favored to represent contrast staining and a component of subarachnoid hemorrhage, which appears roughly unchanged from the intra-procedural CT  CT head repeat 8/20 1330 Minimal fading of subarachnoid contrast/hemorrhage in the posterior fossa and right more than left occipital convexity. Carotid Doppler b/l ICA 40-59% stenosis 2D Echo EF 20-25%. Negative bubble.  LDL 88 HgbA1c 5.5 VTE prophylaxis - on heparin IV Xarelto (rivaroxaban) daily and aspirin 81 prior to admission, Stopped Xarelto for IR procedure. Continued ASA 81  Therapy recommendations:  pending Disposition:  pending, pt now poor outcome and prognosis. Discussed with partner, she will ask pt son come over quickly for GOC discussion.   Hx of Stroke/TIA CVA 16 years ago with residue visual difficulty 10/2021 admitted in [place] for generalized weakness and slurred speech.  CT no acute finding.  CT head and neck multiple severe intracranial stenosis, most notably moderate severe stenosis right M1, short segment proximal right A1, moderate severe stenosis of basilar artery.  Right P2 and P3 segment of occlusion.  Left V4 occlusion, severe near complete atherosclerotic narrowing of bilateral ICAs, right VA origin stenosis.  Continue on aspirin, Xarelto and Lipitor.  Atrial fibrillation Home Meds: Xarelto 20 mg once daily, carvedilol 12.5 mg one and half pills twice daily, amiodarone 200 once dialy On amiodraone, coreg Continue telemetry monitoring ASA 81 mg only current s/p IR w/ SAH  Hypertension Home meds:  carvedilol 12.5 mg one and half pills twice daily,  entresto 49-51 mg twice daily, hydralazine 100 mg twice daily  Hypotensive currently from propofol and pressure support with levophed BP goal: SBP 120-160  Hyperlipidemia Home meds:  atorvastatin 10 mg and zetia 10 mg once daily  LDL 88, goal < 70 Increase Lipitor to 20 and continue Zetia 10. Continue statin at discharge  CHF CAD Status post ICD in 2012, generator change out in 2021 Not compatible with MRI due to generator and leads from different companies 09/2020 TEE EF 30-35% This admission EF 20-25% On GDMT at home  Substance Use Patient uses cannabis UDS positive for [facility]  Cessation education will be provided  Dysphagia Currently intubated Not able to handle oral secretions On TF   Other Stroke Risk Factors Obesity, Body mass index is 31.3 kg/m., BMI >/= 30 associated with increased stroke risk, recommend weight loss, diet and exercise as appropriate  History of cocaine use OSA not on CPAP  Other Active Problems HFrEF s/p AICD, managed by primary team AKI on CKD, Cr 1.7 --> 2.1 (BUN 35) Leuokocytosis, WBC 12.7 --> 15.8 Polycythemia Hb 16.9->17.7->19.0->18.7->17.5  Hospital day # 2  Meryl Dare, MD PGY-1 Psychiatry Resident 12/20/2022, 3:03 PM   ATTENDING NOTE: I reviewed above note and agree with  the assessment and plan. Pt was seen and examined.   Partner is at the bedside. Pt had neuro worsening during admission, became less responsive, significant dysphagia, gurgling in throat, not following commands. CTA repeat showed BA occlusion, discussed with family and Dr. Sherlon Handing, family finally decided intervention. However, procedure complicated by PCA extravasation needing coil embolization. CT head showed stable posterior circulation SAH.   On exam, pt intubated on low dose propofol, eyes closed, not following commands. With forced eye opening, right eye in mid position and left eye was in upper gaze position, not blinking to visual threat, doll's eyes absent,  not tracking, bilateral pupils 6mm fixed. Corneal reflex absent on the left and weak on the right, gag and cough present. Breathing over the vent.  Facial symmetry not able to test due to ET tube.  Tongue protrusion not cooperative. On pain stimulation, BUEs extension posturing, LLE extension posturing and RLE slight withdraw. No babinski bilaterally. Sensation, coordination and gait not tested.   Heparin IV stopped. And now on ASA 81 due to posterior SAH. On statin. Discussed with partner regarding poor prognosis. She is waiting for pt son coming by soon for discussion. Will consider palliative care consult after talking with son.   For detailed assessment and plan, please refer to above/below as I have made changes wherever appropriate.   Marvel Plan, MD PhD Stroke Neurology 12/20/2022 10:20 PM  This patient is critically ill due to posterior circulation infarct with BA occlusion, respiratory failure, afib, pacemaker and at significant risk of neurological worsening, death form recurrent stroke, hemorrhagic conversion, brain herniation, heart failure. This patient's care requires constant monitoring of vital signs, hemodynamics, respiratory and cardiac monitoring, review of multiple databases, neurological assessment, discussion with family, other specialists and medical decision making of high complexity. I spent 40 minutes of neurocritical care time in the care of this patient. I had long discussion with partner at bedside, updated pt current condition, treatment plan and potential prognosis, and answered all the questions. She expressed understanding and appreciation. We are waiting for pt son to come soon for GOC discussion.     To contact Stroke Continuity provider, please refer to [email or web address]. After hours, contact General Neurology

## 2022-12-20 NOTE — Progress Notes (Addendum)
Pt arrived to PACU bay 13 from IR. Patient has a 4N ICU bed but it is awaiting cleaning.  First assessment 0055 Pt is sedated and ventilated. RT at bedside attending to ventilator settings.Marland Kitchen CRNA Tressia Miners at bedside. Patients pupils are 4, round, and nonreactive; CRNA aware and states MD is aware as well. Calls placed to Service Response, Patient placement, 4 N Charge RN to expidite STAT room cleaning. 0140 Chest Xray obtained at bedside. 67 Dr. Mikey Bussing and Dr. Judeth Horn at bedside to assess patient. 6295 Call placed to RT to assist with moving patient to CT for scan per MD's. 0307 Dr. Mikey Bussing assessed patient with attempted NIH after propofol pause of 10 minutes. No response from patient of verbal commands. 0309 Pt unhooked from PACU monitor and placed on portable monitor for transport to CT. [phone] Patient back in PACU from CT; pt tolerated procedure well. 0340 return from CT assessment noted no changes in physical or NIH assessment. 3244 Dr. Jerrell Belfast (neuro) at bedside to assess patient. Dr. Jerrell Belfast placed calls to 4N, 50M and [facility] to expidite patient transfer to an ICU. Dr. Jerrell Belfast gave permission for patient to be placed in any other ICU if no 4N ICU bed is going to be available.He spoke with 50M Charge RN and was told the bed change would need approval from [facility] before they could accept the patient. AC is in a meeting until 0430 and is not reachable. 0102 Vincent Gros, RN  placed call to e-link regarding patients blood pressure of 100/80 as it it lower than ordered perimeters. We are awaiting response from e link for orders. 7253 Vincent Gros RN placed call to [facility] to facillitate transfer to an ICU room.

## 2022-12-20 NOTE — Progress Notes (Signed)
eLink Physician-Brief Progress Note Patient Name: [name] DOB: [birth year] MRN: [id number]   Date of Service  12/20/2022  HPI/Events of Note  62 year old man presenting with aphasia and acute weakness diagnosed with stroke outside the window for TNK 8/17 with marked worst encephalopathy neuroexam 8/19 prompting repeat head imaging concerning for basilar artery occlusion status post IR intervention that was unsuccessful remains intubated after procedure.   Not meeting blood pressure criteria of SBP -140  eICU Interventions  Add norepinephrine peripheral      Intervention Category Intermediate Interventions: Hypotension - evaluation and management  Cody Ortega 12/20/2022, 4:39 AM

## 2022-12-20 NOTE — Progress Notes (Signed)
Patient transported to CT and back without complications. RN at bedside.  

## 2022-12-20 NOTE — Transfer of Care (Signed)
Immediate Anesthesia Transfer of Care Note  Patient: Cody Ortega  Procedure(s) Performed: RADIOLOGY WITH ANESTHESIA  Patient Location: PACU  Anesthesia Type:General  Level of Consciousness: Patient remains intubated per anesthesia plan  Airway & Oxygen Therapy: Patient remains intubated per anesthesia plan and Patient placed on Ventilator (see vital sign flow sheet for setting)  Post-op Assessment: Report given to RN and Post -op Vital signs reviewed and stable  Post vital signs: Reviewed and stable  Last Vitals:  Vitals Value Taken Time  BP 140/104 12/20/22 0055  Temp 36.2 C 12/20/22 0055  Pulse 83 12/20/22 0110  Resp 25 12/20/22 0110  SpO2 96 % 12/20/22 0110  Vitals shown include unfiled device data.  Last Pain:  Vitals:   12/20/22 0055  TempSrc:   PainSc: Asleep      Patients Stated Pain Goal: 7 (12/19/22 0000)  Complications: No notable events documented.

## 2022-12-20 NOTE — Progress Notes (Signed)
SLP Cancellation Note  Patient Details Name: Cody Ortega MRN: [id number] DOB: April 01, 1961   Cancelled treatment:       Reason Eval/Treat Not Completed: Patient not medically ready. Pt now intubated S/P IR procedure. Will await readiness   Laysha Childers, Riley Nearing 12/20/2022, 8:35 AM

## 2022-12-20 NOTE — Progress Notes (Addendum)
S: Acute CVA yesterday afternoon on 8/19. Taken to IR showing basilar artery occlusion s/p thrombectomy. Post procedure contrast extravasation from R PCA treated w/ coils and n-BCA and stenosis treated w/ ballon angioplasty. Patient tranferred to ICU intubated/sedated. Early in am patient pupils 8mm fixed/dilated; corneals present and cough/gag present. Neuro made aware and CTH ordered appears unchaged in comparison to previous CT post IR w/ stable SAH vs contrast. BP labile and is currently on 4 mcg of levo.  O:  General: critically ill appearing on mech vent HEENT: MM pink/moist; ETT in place Neuro: off sedation; posturing in UE and LE; cough/gag present per nurse; pupils 8 mm b/l nonreactive CV: s1s2, RRR, no m/r/g PULM:  dim clear BS bilaterally; on mech vent PRVC GI: soft, bsx4 active  Extremities: warm/dry, no edema  Skin: no rashes or lesions   Plan: Basilar arter occulusion s/p thrombectomy: concern for SAH vs. Contrast post procedure Post op vent management RUL atelectasis vs. infiltrate: aspiration? Hypotension Afib Plan: -neuro following; appreciate recs -cont vent  -cont levo for SBP goal 120-140 per neuro IR -hold per tube anti-hypertensives given labile BP -hold AC in setting of possible bleed; when to resume per neuro -f/u morning labs -will update family after speaking w/ neuro regarding neuro prognostication given poor neuro exam -start on unasyn for aspiration -advance ETT 3 cm given 6 cm from carina  Critical care time: 40 minutes  JD Daryel November Pulmonary & Critical Care 12/20/2022, 8:42 AM  Please see [email or web address] for pager details.  From 7A-7P if no response, please call [phone]. After hours, please call ELink [phone].  I agree with the Advanced Practitioner's note, impression, and recommendations as outlined. I have taken an independent interval history, reviewed the chart and examined the patient.  My medical decision making is as  follows:   Subjective:  ETT advanced due to chest xray read.  Objective: Blood pressure 109/83, pulse (!) 52, temperature 97.9 F (36.6 C), temperature source Oral, resp. rate 19, height 6\' 3"  (1.905 m), weight 113.6 kg, SpO2 98%.  Gen:      Intubated, sedated, acutely ill appearing HEENT:  ETT to vent Lungs:    sounds of mechanical ventilation auscultated no wheeze, diminished right lung field. CV:         RRR Abd:      + bowel sounds; soft, non-tender; no palpable masses, no distension Ext:    No edema Skin:      Warm and dry; no rashes Neuro:   off sedation +cough, not responding to commands and otherwise nonresponsive   Labs/Imaging: Na 138 K 4.1 Cr 2.07  WBC 15.8 Hgb 17.5  Chest xray shows right lung field atelectasis  Assessment and Plan:  Acute hypoxemic respiratory failure Acute encephalopathy secondary to: Basilar artery LVO s/p NIR thrombectomy Extensive peripheral vascular disease and cerebral atherosclerosis Right PCA SAH s/p coil embolization Atrial fibrillation Chronic HFrEf 20% Possible Aspiration Pneumonia  Plan is to continue lung protective ventilation. Neuro prognosis is quite grim. Waiting for son to come in to discuss goals of care. Partner is at bedside. Unable to extubate due to mental status.  Discussed with Dr. Roda Shutters in neurology. Unasyn for aspiration - will send procal. Maintain euvolemia.   The patient is critically ill due to respiratory failure, stroke with multiple organ systems failure and requires high complexity decision making for assessment and support, frequent evaluation and titration of therapies, application of advanced monitoring technologies and extensive interpretation of multiple databases.  Critical Care Time devoted to patient care services described in this note is 38 minutes. This time reflects time of care of this signee Charlott Holler . This critical care time does not reflect separately billable procedures or procedure  time, teaching time and supervisory time of PA/NP/Med student/Med Resident etc but could involve care discussion time.  Mickel Baas Pulmonary and Critical Care Medicine 12/20/2022 11:46 AM  Pager: see AMION  If no response to pager, please call critical care on call (see AMION) until 7pm After 7:00 pm call Elink

## 2022-12-20 NOTE — Procedures (Signed)
INTERVENTIONAL NEURORADIOLOGY BRIEF POSTPROCEDURE NOTE  DIAGNOSTIC CEREBRAL ANGIOGRAM MECHANICAL THROMBECTOMY ENDOVASCULAR VESSEL SACRIFICE INTRACRANIAL ANGIOPLASTY FLAT PANEL HEAD CT  Attending: Baldemar Lenis, MD  Diagnosis: Basilar artery occlusion  Access site: RCFA  Access closure: 20F angioseal  Anesthesia: GETA  Complications: Right PCA perforation treated with vessel sacrifice.  Estimated blood loss: 50 mL  Specimen: None  Findings: Occlusion of the mid basilar artery. Mechanical thrombectomy performed with direct contact aspiration (1 pass) and combined stent retriever + aspiration achieving recanalization. However, contrast extravasation was noted from the right PCA with was treated with coils  + n-BCA. Underlying severe stenosis of the basilar artery was treated with balloon angioplasty. Post procedure flat panel CT showed small amount of hyperdensity in the posterior fossa, consistent with contrast + SAH. No hydrocephalus.  The patient was unstable respiratory wise at the beginning of procedure with O2 sats in the lows 80s. This gradually improved throughout the procedure.   PLAN: - patient will remain intubated due to respiratory status and obtundation - bed rest x 6 hours minimum post femoral access - SBP 120-160 mmHg

## 2022-12-20 NOTE — Sedation Documentation (Signed)
Anesthesia and MD ordered patient to remain intubated

## 2022-12-21 ENCOUNTER — Inpatient Hospital Stay (HOSPITAL_COMMUNITY): Payer: Medicaid Other

## 2022-12-21 DIAGNOSIS — I651 Occlusion and stenosis of basilar artery: Secondary | ICD-10-CM | POA: Diagnosis not present

## 2022-12-21 DIAGNOSIS — J9601 Acute respiratory failure with hypoxia: Secondary | ICD-10-CM | POA: Diagnosis not present

## 2022-12-21 DIAGNOSIS — J9600 Acute respiratory failure, unspecified whether with hypoxia or hypercapnia: Secondary | ICD-10-CM

## 2022-12-21 DIAGNOSIS — I6322 Cerebral infarction due to unspecified occlusion or stenosis of basilar arteries: Secondary | ICD-10-CM

## 2022-12-21 DIAGNOSIS — R918 Other nonspecific abnormal finding of lung field: Secondary | ICD-10-CM | POA: Diagnosis not present

## 2022-12-21 LAB — POCT I-STAT 7, (LYTES, BLD GAS, ICA,H+H)
Acid-base deficit: 3 mmol/L — ABNORMAL HIGH (ref 0.0–2.0)
Bicarbonate: 22.3 mmol/L (ref 20.0–28.0)
Calcium, Ion: 1.18 mmol/L (ref 1.15–1.40)
HCT: 43 % (ref 39.0–52.0)
Hemoglobin: 14.6 g/dL (ref 13.0–17.0)
O2 Saturation: 97 %
Patient temperature: 100.6
Potassium: 4.1 mmol/L (ref 3.5–5.1)
Sodium: 144 mmol/L (ref 135–145)
TCO2: 23 mmol/L (ref 22–32)
pCO2 arterial: 40.8 mmHg (ref 32–48)
pH, Arterial: 7.351 (ref 7.35–7.45)
pO2, Arterial: 99 mmHg (ref 83–108)

## 2022-12-21 LAB — BASIC METABOLIC PANEL
Anion gap: 9 (ref 5–15)
BUN: 57 mg/dL — ABNORMAL HIGH (ref 6–20)
CO2: 21 mmol/L — ABNORMAL LOW (ref 22–32)
Calcium: 8.9 mg/dL (ref 8.9–10.3)
Chloride: 110 mmol/L (ref 98–111)
Creatinine, Ser: 2.77 mg/dL — ABNORMAL HIGH (ref 0.61–1.24)
GFR, Estimated: 25 mL/min — ABNORMAL LOW (ref >60)
Glucose, Bld: 132 mg/dL — ABNORMAL HIGH (ref 70–99)
Potassium: 3.9 mmol/L (ref 3.5–5.1)
Sodium: 140 mmol/L (ref 135–145)

## 2022-12-21 LAB — CBC
HCT: 47 % (ref 39.0–52.0)
Hemoglobin: 15.6 g/dL (ref 13.0–17.0)
MCH: 30.8 pg (ref 26.0–34.0)
MCHC: 33.2 g/dL (ref 30.0–36.0)
MCV: 92.9 fL (ref 80.0–100.0)
Platelets: 161 10*3/uL (ref 150–400)
RBC: 5.06 MIL/uL (ref 4.22–5.81)
RDW: 14.3 % (ref 11.5–15.5)
WBC: 17.7 10*3/uL — ABNORMAL HIGH (ref 4.0–10.5)
nRBC: 0 % (ref 0.0–0.2)

## 2022-12-21 LAB — MAGNESIUM: Magnesium: 2.2 mg/dL (ref 1.7–2.4)

## 2022-12-21 LAB — GLUCOSE, CAPILLARY
Glucose-Capillary: 102 mg/dL — ABNORMAL HIGH (ref 70–99)
Glucose-Capillary: 103 mg/dL — ABNORMAL HIGH (ref 70–99)
Glucose-Capillary: 125 mg/dL — ABNORMAL HIGH (ref 70–99)
Glucose-Capillary: 129 mg/dL — ABNORMAL HIGH (ref 70–99)
Glucose-Capillary: 130 mg/dL — ABNORMAL HIGH (ref 70–99)
Glucose-Capillary: 138 mg/dL — ABNORMAL HIGH (ref 70–99)
Glucose-Capillary: 143 mg/dL — ABNORMAL HIGH (ref 70–99)

## 2022-12-21 LAB — TRIGLYCERIDES: Triglycerides: 104 mg/dL (ref <150)

## 2022-12-21 NOTE — Progress Notes (Signed)
NAME:  Cody Ortega, MRN:  [id number], DOB:  01-16-1961, LOS: 3 ADMISSION DATE:  12/17/2022, CONSULTATION DATE:  8/20 REFERRING MD:  Dr. Janalyn Shy, CHIEF COMPLAINT:  CVA   History of Present Illness:  62 year old male with past medical history as below, which is significant for asthma, CAD, HFrEF, CKD, substance abuse history, hypertension, sleep apnea, atrial fibrillation on Xarelto, and AICD in place.  He presented to [facility] emergency department on 8/17 following a fall.  He initially refused EMS transport but continued to have weakness in all 4 extremities as well as dysarthria so he agreed to come to the ED.  Code stroke was activated upon arrival to the ED, however, he was outside the window for any thrombolytic.  Presents sensation thought to be secondary to small stroke.  Symptoms not felt to be likely with LVO.  He was admitted to the hospitalist service with stroke service following.  In the evening hours of 8/19 he was found to have a worsening neuroexam.  NIH worsened to 22 and interventional radiology was activated.  Prior to the interventional procedure the patient was desaturating and was ultimately intubated.  O2 sats were in the low 80s prior to intubation.  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  Contrast extravasation was noted from the right PCA and was treated with coils.  Additional balloon angioplasty to the basilar artery.  Post procedurally he remained on mechanical ventilator and was transferred to the ICU for further monitoring.  PCCM asked to evaluate for ICU care.  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   Significant Hospital Events: Including procedures, antibiotic start and stop dates in addition to other pertinent events   8/17 admit with CVA. Outside window. Not felt to be amenable to IR 8/19 Symptoms worse. To IR. Basilar occlusion s/p thrombectomy, angioplasty. Complicated by contrast extravasation  Interim History / Subjective:  CT head yesterday afternoon w/ fading SAH in posterior fossa and right more than left occipital convexity; no new abnormality. Remains on vent Pupils still 8 mm unresponsive to light; not following commands; not posturing today; flexes in all extremities to painful stimuli; cough/gag present  Objective   Blood pressure 135/76, pulse 93, temperature 97.7 F (36.5 C), temperature source Axillary, resp. rate (!) 26, height 6\' 3"  (1.905 m), weight 113.6 kg, SpO2 97%.    Vent Mode: PRVC FiO2 (%):  [40 %-70 %] 40 % Set Rate:  [18 bmp] 18 bmp Vt Set:  [550 mL] 550 mL PEEP:  [10 cmH20] 10 cmH20 Plateau Pressure:  [14 cmH20-19 cmH20] 19 cmH20   Intake/Output Summary (Last 24 hours) at 12/21/2022 0721 Last data filed at 12/21/2022 0700 Gross per 24 hour  Intake 1356.77 ml  Output 1250 ml  Net 106.77 ml   Filed Weights   12/19/22 0540 12/20/22 0538 12/21/22 0500  Weight: 110.3 kg 113.6 kg 113.6 kg    Examination: General: Middle aged male on vent HENT: [place]/AT, no JVD. Pupils 8mm and fixed bilaterally.  Lungs: Diminished on the right. Clear on the left. Triggering vent spontaneously Cardiovascular: RRR, no MRG Abdomen: Soft, NT, ND Extremities: No acute deformity or ROM limitation Neuro: Pupils still 8 mm unresponsive to light; not  following commands; not posturing today; withdraws in all extremities to painful stimuli; cough/gag present  Resolved Hospital Problem list     Assessment & Plan:   Acute CVA; cardioembolic , non-compliant with Xarelto Mid basilar occlusion s/p mechanical thrombectomy and balloon angioplasty complicated by contrast extravasation +/- SAH Plan: -neuro following; appreciate recs -repeat CT head yesterday w/ no new abnormality; further imaging per neuro -frequent neuro checks -limit sedating meds; hold sedation -SBP goal 120-160 per neuro IR; consider resuming home bp meds when appropriate -statin, DAPT, AC per neuro -Continue neuroprotective measures- normothermia, euglycemia, HOB greater than 30, head in neutral alignment, normocapnia, normoxia.  -ongoing GOC discussion  Acute hypoxemic respiratory failure RUL atelectasis vs. Infiltrate-Possible aspiration pneumonia COPD OSA Plan: -no plan for extubation given poor mental status -LTVV strategy with tidal volumes of 6-8 cc/kg ideal body weight -Wean PEEP/FiO2 for SpO2 >92% -VAP bundle in place -Daily SAT and SBT -PAD protocol in place -wean sedation for RASS goal 0 to -1 -triple therapy nebs -unasyn for aspiration ppx -trach aspirate  HFrEF: LVEF 20-25% Atrial fibrillation on Xarelto CAD HTN Plan: -hold AC; when to resume per stroke team -tele monitoring -cont amio -hold GDMT for now  AKI on CKD III Plan: -Trend BMP / urinary output -Replace electrolytes as indicated -Avoid nephrotoxic agents, ensure adequate renal perfusion  Nutrition Dysphagia Plan: -TF via cortrak  Hx of anxiety Plan: -hold xanax for now given poor mental status  Substance abuse - THC and benzo on UDS Plan: -counseling when appropriate   Best Practice (right click and "Reselect all SmartList Selections" daily)   Diet/type: NPO DVT prophylaxis: SCD GI prophylaxis: H2B Lines: N/A Foley:  N/A Code Status:  full code Last date of  multidisciplinary goals of care discussion [8/21 updated patient's son, girlfriend, and x-wife at bedside and expressed concern about patient's neuro prognosis. Girlfriend states that she is still hopeful that he will wake up. ]  Labs   CBC: Recent Labs  Lab 12/17/22 1748 12/17/22 1753 12/19/22 0846 12/19/22 2233 12/20/22 0002 12/20/22 0740 12/21/22 0500  WBC 9.4  --  12.7*  --   --  15.8* 17.7*  NEUTROABS 7.5  --   --   --   --   --   --   HGB 16.9   < > 19.0* 18.7* 16.7 17.5* 15.6  HCT 50.5   < > 54.8* 55.0* 49.0 53.3* 47.0  MCV 90.7  --  90.4  --   --  91.7 92.9  PLT 170  --  195  --   --  177 161   < > = values in this interval not displayed.    Basic Metabolic Panel: Recent Labs  Lab 12/17/22 1748 12/17/22 1753 12/18/22 0730 12/19/22 1106 12/19/22 2233 12/20/22 0002 12/20/22 0740 12/21/22 0500  NA 141 142 141 141 142 141 138 140  K 3.6 4.5 3.1* 4.1 4.7 4.5 4.1 3.9  CL 110 111 109 108  --   --  108 110  CO2 20*  --  21* 20*  --   --  20* 21*  GLUCOSE 129* 123* 122* 119*  --   --  149* 132*  BUN 19 30* 18 19  --   --  35* 57*  CREATININE 1.92* 1.90*  1.79* 1.71*  --   --  2.07* 2.77*  CALCIUM 9.4  --  9.2 9.4  --   --  9.1 8.9  MG  --   --  2.0 1.9  --   --   --  2.2  PHOS  --   --  2.8 2.6  --   --   --   --    GFR: Estimated Creatinine Clearance: 38.5 mL/min (A) (by C-G formula based on SCr of 2.77 mg/dL (H)). Recent Labs  Lab 12/17/22 1748 12/17/22 1812 12/19/22 0846 12/20/22 0740 12/20/22 1202 12/21/22 0500  PROCALCITON  --   --   --   --  <0.10  --   WBC 9.4  --  12.7* 15.8*  --  17.7*  LATICACIDVEN  --  1.2  --   --   --   --     Liver Function Tests: Recent Labs  Lab 12/17/22 1748 12/18/22 0730 12/19/22 1106  AST 14*  --   --   ALT 13  --   --   ALKPHOS 57  --   --   BILITOT 0.9  --   --   PROT 6.8  --   --   ALBUMIN 3.9 3.7 3.8   No results for input(s): "LIPASE", "AMYLASE" in the last 168 hours. Recent Labs  Lab 12/19/22 1656   AMMONIA 32    ABG    Component Value Date/Time   PHART 7.287 (L) 12/20/2022 0002   PCO2ART 45.7 12/20/2022 0002   PO2ART 135 (H) 12/20/2022 0002   HCO3 21.8 12/20/2022 0002   TCO2 23 12/20/2022 0002   ACIDBASEDEF 5.0 (H) 12/20/2022 0002   O2SAT 99 12/20/2022 0002     Coagulation Profile: Recent Labs  Lab 12/17/22 1748  INR 1.0    Cardiac Enzymes: No results for input(s): "CKTOTAL", "CKMB", "CKMBINDEX", "TROPONINI" in the last 168 hours.  HbA1C: Hgb A1c MFr Bld  Date/Time Value Ref Range Status  12/18/2022 01:14 AM 5.5 4.8 - 5.6 % Final    Comment:    (NOTE) Pre diabetes:          5.7%-6.4%  Diabetes:              >6.4%  Glycemic control for   <7.0% adults with diabetes   12/09/2020 02:58 PM 5.6 4.8 - 5.6 % Final    Comment:             Prediabetes: 5.7 - 6.4          Diabetes: >6.4          Glycemic control for adults with diabetes: <7.0     CBG: Recent Labs  Lab 12/20/22 1155 12/20/22 1527 12/20/22 2002 12/21/22 0004 12/21/22 0354  GLUCAP 139* 121* 143* 125* 103*    Review of Systems:   Patient is encephalopathic and/or intubated; therefore, history has been obtained from chart review.    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   Surgical History:   Past Surgical History:  Procedure Laterality  Date   CARDIOVERSION N/A  01/07/2014   Procedure: CARDIOVERSION;  Surgeon: Lewayne Bunting, MD;  Location: [facility] ENDOSCOPY;  Service: Cardiovascular;  Laterality: N/A;   CARDIOVERSION N/A 02/11/2014   Procedure: CARDIOVERSION;  Surgeon: Chrystie Nose, MD;  Location: [facility] ENDOSCOPY;  Service: Cardiovascular;  Laterality: N/A;   CARDIOVERSION N/A 10/01/2020   Procedure: CARDIOVERSION;  Surgeon: Christell Constant, MD;  Location: [facility] ENDOSCOPY;  Service: Cardiovascular;  Laterality: N/A;   CATARACT EXTRACTION     COLONOSCOPY WITH PROPOFOL N/A 02/06/2018   Procedure: COLONOSCOPY WITH PROPOFOL;  Surgeon: Benancio Deeds, MD;  Location: [facility] ENDOSCOPY;  Service: Gastroenterology;  Laterality: N/A;   EYE SURGERY     ICD GENERATOR CHANGEOUT N/A 01/17/2020   Procedure: ICD GENERATOR CHANGEOUT;  Surgeon: Marinus Maw, MD;  Location[facility] INVASIVE CV LAB;  Service: Cardiovascular;  Laterality: N/A;   LASEK eye surgery Bilateral 05/2018   LASIK Bilateral    PACEMAKER INSERTION     POLYPECTOMY  02/06/2018   Procedure: POLYPECTOMY;  Surgeon: Benancio Deeds, MD;  Location: [facility] ENDOSCOPY;  Service: Gastroenterology;;   RADIOLOGY WITH ANESTHESIA N/A 12/19/2022   Procedure: RADIOLOGY WITH ANESTHESIA;  Surgeon: Radiologist, Medication, MD;  Location: [facility] OR;  Service: Radiology;  Laterality: N/A;   TEE WITHOUT CARDIOVERSION N/A 01/07/2014   Procedure: TRANSESOPHAGEAL ECHOCARDIOGRAM (TEE);  Surgeon: Lewayne Bunting, MD;  Location: [facility] ENDOSCOPY;  Service: Cardiovascular;  Laterality: N/A;   TEE WITHOUT CARDIOVERSION N/A 02/11/2014   Procedure: TRANSESOPHAGEAL ECHOCARDIOGRAM (TEE);  Surgeon: Chrystie Nose, MD;  Location: [facility] ENDOSCOPY;  Service: Cardiovascular;  Laterality: N/A;   TEE WITHOUT CARDIOVERSION N/A 10/01/2020   Procedure: TRANSESOPHAGEAL ECHOCARDIOGRAM (TEE);  Surgeon: Christell Constant, MD;  Location: [facility] ENDOSCOPY;  Service: Cardiovascular;  Laterality: N/A;   TONSILLECTOMY     TRANSTHORACIC ECHOCARDIOGRAM   10/2006, 12/2009     Social History:   reports that he quit smoking about 12 years ago. His smoking use included cigarettes. He started smoking about 13 years ago. He has a 0.2 pack-year smoking history. He has never used smokeless tobacco. He reports that he does not currently use alcohol. He reports that he does not use drugs.   Family History:  His family history includes Breast cancer in his mother; Cerebral palsy in his daughter; Diabetes in his father; Multiple myeloma in his mother; Peripheral vascular disease in his father; Prostate cancer in his father. There is no history of Lung disease, Colon cancer, Stomach cancer, Esophageal cancer, or Colon polyps.   Allergies Allergies  Allergen Reactions   Prochlorperazine Other (See Comments)    Medication caused a lot of issues like blurred vision, nausea, pain, and carpool turnel   Codeine Itching, Nausea And Vomiting and Hives   Hydrocodone-Acetaminophen Itching and Nausea And Vomiting     Home Medications  Prior to Admission medications   Medication Sig Start Date End Date Taking? Authorizing Provider  acetaminophen (TYLENOL) 500 MG tablet Take 1,000 mg by mouth every 6 (six) hours as needed for moderate pain or headache.   Yes [provider]  albuterol (VENTOLIN HFA) 108 (90 Base) MCG/ACT inhaler INHALE 2 PUFFS INTO THE LUNGS EVERY 6 HOURS AS NEEDED FOR WHEEZING OR SHORTNESS OF BREATH 12/13/22  Yes Marcine Matar, MD  alprazolam Prudy Feeler) 2 MG tablet Take 2 mg by mouth daily as needed for anxiety.   Yes [provider]  Artificial Tear Solution (TEARS NATURALE OP) Place 1 drop into both eyes daily as needed (dry  eyes).   Yes [provider]  aspirin EC 81 MG tablet Take 81 mg by mouth daily. Swallow whole.   Yes [provider]  atorvastatin (LIPITOR) 10 MG tablet TAKE 1 TABLET(10 MG) BY MOUTH DAILY 11/21/22  Yes Tonny Bollman, MD  carvedilol (COREG) 12.5 MG tablet TAKE 1 AND 1/2 TABLETS BY MOUTH  TWICE DAILY 12/13/22  Yes Marcine Matar, MD  dapagliflozin propanediol (FARXIGA) 10 MG TABS tablet Take 1 tablet (10 mg total) by mouth daily before breakfast. 04/14/22  Yes Tonny Bollman, MD  entecavir (BARACLUDE) 0.5 MG tablet Take 1 tablet (0.5 mg total) by mouth every other day. 08/16/22  Yes Armbruster, Willaim Rayas, MD  ENTRESTO 49-51 MG TAKE 1 TABLET BY MOUTH TWICE DAILY 02/15/22  Yes Tonny Bollman, MD  ezetimibe (ZETIA) 10 MG tablet TAKE 1 TABLET(10 MG) BY MOUTH DAILY 08/29/22  Yes Tonny Bollman, MD  furosemide (LASIX) 40 MG tablet TAKE 1/2 TABLET(20 MG) BY MOUTH DAILY 12/29/21  Yes Tonny Bollman, MD  hydrALAZINE (APRESOLINE) 100 MG tablet TAKE 1 TABLET(100 MG) BY MOUTH IN THE MORNING AND AT BEDTIME 04/26/22  Yes Nahser, Deloris Ping, MD  ondansetron (ZOFRAN-ODT) 8 MG disintegrating tablet Take 8 mg by mouth daily. 12/11/19  Yes [provider]  Rectal Protectant-Emollient (CALMOL-4) 76-10 % SUPP Use as directed once to twice daily 04/08/22  Yes Armbruster, Willaim Rayas, MD  RESTASIS 0.05 % ophthalmic emulsion Place 1 drop into both eyes in the morning. 11/25/21  Yes [provider]  sildenafil (REVATIO) 20 MG tablet Take 1 tablet (20 mg total) by mouth as needed. 12/03/21  Yes Tonny Bollman, MD  SYMBICORT 160-4.5 MCG/ACT inhaler INHALE 2 PUFFS INTO THE LUNGS TWICE DAILY 11/10/21  Yes Marcine Matar, MD  tamsulosin (FLOMAX) 0.4 MG CAPS capsule TAKE 1 CAPSULE(0.4 MG) BY MOUTH DAILY 10/25/22  Yes Marcine Matar, MD  XARELTO 20 MG TABS tablet TAKE 1 TABLET(20 MG) BY MOUTH DAILY WITH SUPPER 09/28/22  Yes Camnitz, Will Daphine Deutscher, MD  amiodarone (PACERONE) 200 MG tablet Take 1 tablet (200 mg total) by mouth daily. Patient not taking: Reported on 12/17/2022 12/09/21   Regan Lemming, MD  DULoxetine (CYMBALTA) 30 MG capsule Take 90 mg by mouth daily with breakfast. Patient not taking: Reported on 12/17/2022    [provider]  fluticasone (FLONASE) 50 MCG/ACT nasal spray  SHAKE LIQUID AND USE 1 SPRAY IN [facility] NOSTRIL DAILY Patient not taking: Reported on 12/17/2022 12/13/22   Marcine Matar, MD  polyethylene glycol (MIRALAX) 17 g packet Take 17 g by mouth 2 (two) times daily. Take as directed, Daily, titrate as needed Patient not taking: Reported on 12/17/2022 04/08/22   Benancio Deeds, MD  zaleplon (SONATA) 10 MG capsule Take 10 mg by mouth at bedtime. Patient not taking: Reported on 12/17/2022 09/20/22   [provider]     Critical care time: 35 minutes     JD Anselm Lis Rea Pulmonary & Critical Care 12/21/2022, 8:38 AM  Please see [email or web address] for pager details.  From 7A-7P if no response, please call [phone]. After hours, please call ELink [phone].

## 2022-12-21 NOTE — Anesthesia Postprocedure Evaluation (Signed)
Anesthesia Post Note  Patient: Cody Ortega  Procedure(s) Performed: RADIOLOGY WITH ANESTHESIA     Patient location during evaluation: PACU Anesthesia Type: General Level of consciousness: sedated Pain management: pain level controlled Vital Signs Assessment: post-procedure vital signs reviewed and stable Respiratory status: patient remains intubated per anesthesia plan Cardiovascular status: stable Postop Assessment: no apparent nausea or vomiting Anesthetic complications: no   No notable events documented.  Last Vitals:  Vitals:   12/21/22 1040 12/21/22 1200  BP: (!) 151/67   Pulse: 90   Resp: (!) 24   Temp:  (!) 38.4 C  SpO2: 96%     Last Pain:  Vitals:   12/21/22 1200  TempSrc: Oral  PainSc:                  Cody Ortega

## 2022-12-21 NOTE — Progress Notes (Addendum)
This chaplain responded to the RN's consult for spiritual care in the setting of a blessing. The Pt. is intubated, sedated, and non responsive. After the chaplain assessment, the family decided on prayer and a chaplain phone call to update [facility] of the Pt. admission.   The Pt. son-Ethan accepted the chaplain's invitation for story telling. The chaplain listened reflectively to the father-son relationship and the Pt.'s areas of service at [facility].  The Pt. ex-wife is sleeping in the corner of the room during the visit.    The chaplain is appreciative of RN-Bri's update. The chaplain understands the family is waiting on a Palliative Goals of Care meeting before proceeding with next steps of care. This chaplain is available for F/U spiritual care.  Chaplain Stephanie Acre [phone]

## 2022-12-21 NOTE — Progress Notes (Signed)
    Referral received for Cody Ortega :goals of care discussion. Chart reviewed and discussed patient with Chaplain Stephanie Acre. Unfortunately due to staffing constraints, earliest available date for GOC discussion is 8/22.  Attempted to reach patient's son Cody Ortega twice to schedule family meeting but was unable to reach. Unable to leave voicemail as his inbox was full.   Discussed with RN and appreciate her assistance with providing PMT contact information to family. Will await return call with family's preferred time for GOC discussion on 8/22 pending their availability and preference.  PMT will re-attempt to contact family at a later time/date. Detailed note and recommendations to follow once GOC has been completed.   Thank you for your referral and allowing PMT to assist in [name] [facility] care.   Richardson Dopp, [facility] Palliative Medicine Team  Team Phone # [phone]   NO CHARGE

## 2022-12-21 NOTE — Progress Notes (Addendum)
STROKE TEAM PROGRESS NOTE   BRIEF HPI This is a 62 yo man with pmhx significant for CAD, CHF, CKD stage IV, HL, HTN, a fib on xarelto, prior stroke who presents with L sided weakness and dysarthria. Outside of window for TNK and no LVO for thrombectomy. Nonadherent to zarelto but could not get exact reason due to dysarthria.    SIGNIFICANT HOSPITAL EVENTS 8/17 evening: presented to ED 8/19: new neurologic sx, CTA basilar occlusion requiring thrombectomy complicated by R PCA perforation requiring coil embolization and posterior fossa with residual contrast and/or SAH.   INTERIM HISTORY/SUBJECTIVE Pt is comatose and family is at bedside including son, ex wife (mother of son), and current partner who has been present since yesterday. Discussion held with family regarding the etiology of current presentation, events leading up to now, and the poor prognosis for any neurologic recovery and high risk for mortality this admission. Reported very little improvement of brainstem functioning to family found on exam today.   Family does not report that the pt has an advanced directive or had conversation around his wishes. We reported that we could get palliative care to have a discussion with the patient.     OBJECTIVE  CBC    Component Value Date/Time   WBC 17.7 (H) 12/21/2022 0500   RBC 5.06 12/21/2022 0500   HGB 15.6 12/21/2022 0500   HGB 16.0 06/02/2022 1426   HCT 47.0 12/21/2022 0500   HCT 46.7 06/02/2022 1426   PLT 161 12/21/2022 0500   PLT 185 06/02/2022 1426   MCV 92.9 12/21/2022 0500   MCV 91 06/02/2022 1426   MCH 30.8 12/21/2022 0500   MCHC 33.2 12/21/2022 0500   RDW 14.3 12/21/2022 0500   RDW 15.1 06/02/2022 1426   LYMPHSABS 1.1 12/17/2022 1748   LYMPHSABS 0.8 10/15/2020 1118   MONOABS 0.6 12/17/2022 1748   EOSABS 0.1 12/17/2022 1748   EOSABS 0.1 10/15/2020 1118   BASOSABS 0.1 12/17/2022 1748   BASOSABS 0.0 10/15/2020 1118    BMET    Component Value Date/Time   NA 140  12/21/2022 0500   NA 133 (L) 06/02/2022 1426   K 3.9 12/21/2022 0500   CL 110 12/21/2022 0500   CO2 21 (L) 12/21/2022 0500   GLUCOSE 132 (H) 12/21/2022 0500   BUN 57 (H) 12/21/2022 0500   BUN 26 (H) 06/02/2022 1426   CREATININE 2.77 (H) 12/21/2022 0500   CREATININE 1.38 (H) 02/08/2016 1155   CALCIUM 8.9 12/21/2022 0500   EGFR 29 (L) 06/02/2022 1426   GFRNONAA 25 (L) 12/21/2022 0500    IMAGING past 24 hours DG CHEST PORT 1 VIEW  Result Date: 12/21/2022 CLINICAL DATA:  Acute respiratory failure with hypoxia. EXAM: PORTABLE CHEST 1 VIEW COMPARISON:  December 20, 2022. FINDINGS: The heart size and mediastinal contours are within normal limits. Endotracheal and feeding tubes are unchanged. Left-sided defibrillator is unchanged. Left lung is clear. Minimal right basilar subsegmental atelectasis or possibly infiltrate is noted. The visualized skeletal structures are unremarkable. IMPRESSION: Stable support apparatus.  Right basilar opacity is noted above. Electronically Signed   By: Lupita Raider M.D.   On: 12/21/2022 09:09   CT HEAD WO CONTRAST ( )  Result Date: 12/20/2022 CLINICAL DATA:  Stroke follow-up EXAM: CT HEAD WITHOUT CONTRAST TECHNIQUE: Contiguous axial images were obtained from the base of the skull through the vertex without intravenous contrast. RADIATION DOSE REDUCTION: This exam was performed according to the departmental dose-optimization program which includes automated exposure control, adjustment of  the mA and/or kV according to patient size and/or use of iterative reconstruction technique. COMPARISON:  Head CT from earlier today FINDINGS: Brain: Subarachnoid blood/contrast along the superior cerebellar convexities and along the right more than left occipital convexities is slightly decreased in density. No evidence of interval bleeding. Chronic pontine and right deep gray nucleus infarcts. Expected acute infarct in the posterior circulation but difficult to detect in the setting  of subarachnoid high-density. No hydrocephalus. Vascular: No hyperdense vessel or unexpected calcification. Skull: Normal. Negative for fracture or focal lesion. Sinuses/Orbits: No acute finding. IMPRESSION: Minimal fading of subarachnoid contrast/hemorrhage in the posterior fossa and right more than left occipital convexity. No new abnormality. Electronically Signed   By: [name] M.D.   On: 12/20/2022 17:16    Vitals:   12/21/22 0900 12/21/22 0930 12/21/22 1000 12/21/22 1030  BP:  130/78 126/74 136/79  Pulse: (!) 59 64 65 83  Resp: (!) 27 (!) 27 (!) 25 (!) 24  Temp:      TempSrc:      SpO2: 96% 94% 94% 96%  Weight:      Height:         PHYSICAL EXAM General:  intubated and sedated and nonresponsive.  Respiratory:  synchronous with the ventilator.   NEURO:  Mental Status: stupor, does not open eyes to sternal rub. Appear to have mostly extensor responses to painful stimuli.    CN: pupils dilated and nonreactive bilaterally. absent oculocephalic reflex. Present corneal reflex bilaterally, improved from yesterday. Gag intact to endotracheal tube suction.    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 ASPECTS 10.    CT cervical spine no acute fracture or subluxation  CT head 24hr p/ost stroke stable, unchanged Not able to have MRI due to generator and leads are from different providers CTA head neck 8/19 1835 occluded basilar artery S/p IR with BA recanalization, However, contrast extravasation was noted from the right PCA with was treated with coils + n-BCA. Underlying severe stenosis of the basilar artery was treated with balloon angioplasty.  CT head s/p IR 8/20 0300 hyperdense material in the subarachnoid spaces of the inferomedial  parietal lobes, right greater than left occipital lobes, and right greater than left cerebellar hemispheres, favored to represent contrast staining and a component of subarachnoid hemorrhage, which appears roughly unchanged from the intra-procedural CT  CT head repeat 8/20 1330 Minimal fading of subarachnoid contrast/hemorrhage in the posterior fossa and right more than left occipital convexity. Carotid Doppler b/l ICA 40-59% stenosis 2D Echo EF 20-25%. Negative bubble.  LDL 88 HgbA1c 5.5 VTE prophylaxis - on heparin IV Xarelto (rivaroxaban) daily and aspirin 81 prior to admission, Stopped Xarelto for [facility]. Continued ASA 81  Therapy recommendations:  pending Disposition:  pending, pt now poor outcome and prognosis. Discussed with son and ex-wife, will have palliative care involvement, but family leaning towards comfort   Goals of care Family is now all present in room, informed of the patient poor neurologic prognosis and loss of brainstem reflexes They report pt does not have advanced directive and has not expressed any specific wishing in conversation Consult to palliative care  Hx of Stroke/TIA CVA 16 years ago with residue visual difficulty 10/2021 admitted in [place] for generalized weakness and slurred speech.  CT no acute finding.  CT head and neck multiple severe intracranial stenosis, most notably moderate severe stenosis right M1, short segment proximal right A1, moderate severe stenosis of basilar artery.  Right P2 and P3 segment of occlusion.  Left V4 occlusion, severe near complete atherosclerotic narrowing of bilateral ICAs, right VA origin stenosis.  Continue on aspirin, Xarelto and Lipitor.  Atrial fibrillation Home Meds: Xarelto 20 mg once daily, carvedilol 12.5 mg one and half pills twice daily, amiodarone 200 once dialy On amiodraone, coreg Continue telemetry monitoring ASA 81 mg only current s/p IR w/ SAH  Hypertension Home meds:  carvedilol 12.5 mg one and half pills  twice daily, entresto 49-51 mg twice daily, hydralazine 100 mg twice daily  Hypotensive currently from propofol and pressure support with levophed BP goal: SBP 120-160  Hyperlipidemia Home meds:  atorvastatin 10 mg and zetia 10 mg once daily  LDL 88, goal < 70 Increase Lipitor to 20 and continue Zetia 10. Continue statin at discharge  CHF CAD Status post ICD in 2012, generator change out in 2021 Not compatible with MRI due to generator and leads from different companies 09/2020 TEE EF 30-35% This admission EF 20-25% On GDMT at home  Substance Use Patient uses cannabis UDS positive for [facility]  Cessation education will be provided  Dysphagia Currently intubated Not able to handle oral secretions On TF   Other Stroke Risk Factors Obesity, Body mass index is 31.3 kg/m., BMI >/= 30 associated with increased stroke risk, recommend weight loss, diet and exercise as appropriate  History of cocaine use OSA not on CPAP  Other Active Problems HFrEF s/p AICD, managed by primary team AKI on CKD, Cr 1.7 --> 2.1 (BUN 35) Leuokocytosis, WBC 12.7 --> 15.8 Polycythemia Hb 16.9->17.7->19.0->18.7->17.5  Hospital day # 3  Meryl Dare, MD PGY-1 Psychiatry Resident 12/21/2022, 10:58 AM   ATTENDING NOTE: I reviewed above note and agree with the assessment and plan. Pt was seen and examined.   Partner, ex-wife and son are at the bedside. Pt still intubated on vent, not following commands. B/l pupil fixed and dilated. Left eye upper gaze, but now corneal present bilaterally, cough and gag present. With painful stimuli, mild extension posturing bilaterally. Pt neuro exam slightly better than yesterday but still consistent with poor prognosis. Discussed with ex-wife and son, will have palliative care on board for further GOC discussion but seems family leaning towards comfort care.   For detailed assessment and plan, please refer to above/below as I have made changes wherever appropriate.    Marvel Plan, MD PhD Stroke Neurology 12/21/2022 11:02 PM  This patient is critically ill due to posterior circulation infarct with BA occlusion, respiratory failure, afib, pacemaker and at significant risk of neurological worsening, death form recurrent stroke, hemorrhagic conversion, brain herniation, heart failure. This patient's care requires constant monitoring of vital signs, hemodynamics, respiratory and cardiac monitoring, review of multiple databases, neurological assessment, discussion with family, other specialists and medical decision making of high complexity. I spent 40 minutes of neurocritical care time in the care of this patient. I had long discussion with son ex-wife and partner at bedside, updated pt current condition, treatment plan and potential prognosis, and answered all the questions. They expressed understanding and appreciation.     To contact Stroke Continuity provider, please refer to [email or web address]. After hours, contact General Neurology

## 2022-12-22 DIAGNOSIS — I6322 Cerebral infarction due to unspecified occlusion or stenosis of basilar arteries: Secondary | ICD-10-CM | POA: Diagnosis not present

## 2022-12-22 DIAGNOSIS — J9601 Acute respiratory failure with hypoxia: Secondary | ICD-10-CM | POA: Diagnosis not present

## 2022-12-22 DIAGNOSIS — Z7189 Other specified counseling: Secondary | ICD-10-CM

## 2022-12-22 DIAGNOSIS — I639 Cerebral infarction, unspecified: Secondary | ICD-10-CM | POA: Diagnosis not present

## 2022-12-22 DIAGNOSIS — E440 Moderate protein-calorie malnutrition: Secondary | ICD-10-CM | POA: Diagnosis not present

## 2022-12-22 DIAGNOSIS — K7460 Unspecified cirrhosis of liver: Secondary | ICD-10-CM | POA: Diagnosis not present

## 2022-12-22 DIAGNOSIS — J9600 Acute respiratory failure, unspecified whether with hypoxia or hypercapnia: Secondary | ICD-10-CM

## 2022-12-22 DIAGNOSIS — N1832 Chronic kidney disease, stage 3b: Secondary | ICD-10-CM | POA: Diagnosis not present

## 2022-12-22 DIAGNOSIS — I6312 Cerebral infarction due to embolism of basilar artery: Secondary | ICD-10-CM | POA: Diagnosis not present

## 2022-12-22 DIAGNOSIS — B1910 Unspecified viral hepatitis B without hepatic coma: Secondary | ICD-10-CM | POA: Diagnosis not present

## 2022-12-22 DIAGNOSIS — I651 Occlusion and stenosis of basilar artery: Secondary | ICD-10-CM | POA: Diagnosis not present

## 2022-12-22 LAB — CBC
HCT: 43.6 % (ref 39.0–52.0)
Hemoglobin: 14 g/dL (ref 13.0–17.0)
MCH: 30.5 pg (ref 26.0–34.0)
MCHC: 32.1 g/dL (ref 30.0–36.0)
MCV: 95 fL (ref 80.0–100.0)
Platelets: 132 10*3/uL — ABNORMAL LOW (ref 150–400)
RBC: 4.59 MIL/uL (ref 4.22–5.81)
RDW: 14.6 % (ref 11.5–15.5)
WBC: 11.3 10*3/uL — ABNORMAL HIGH (ref 4.0–10.5)
nRBC: 0 % (ref 0.0–0.2)

## 2022-12-22 LAB — BASIC METABOLIC PANEL
Anion gap: 14 (ref 5–15)
BUN: 91 mg/dL — ABNORMAL HIGH (ref 6–20)
CO2: 22 mmol/L (ref 22–32)
Calcium: 8.5 mg/dL — ABNORMAL LOW (ref 8.9–10.3)
Chloride: 110 mmol/L (ref 98–111)
Creatinine, Ser: 3.65 mg/dL — ABNORMAL HIGH (ref 0.61–1.24)
GFR, Estimated: 18 mL/min — ABNORMAL LOW (ref >60)
Glucose, Bld: 148 mg/dL — ABNORMAL HIGH (ref 70–99)
Potassium: 3.8 mmol/L (ref 3.5–5.1)
Sodium: 146 mmol/L — ABNORMAL HIGH (ref 135–145)

## 2022-12-22 LAB — GLUCOSE, CAPILLARY
Glucose-Capillary: 125 mg/dL — ABNORMAL HIGH (ref 70–99)
Glucose-Capillary: 112 mg/dL — ABNORMAL HIGH (ref 70–99)
Glucose-Capillary: 113 mg/dL — ABNORMAL HIGH (ref 70–99)
Glucose-Capillary: 117 mg/dL — ABNORMAL HIGH (ref 70–99)

## 2022-12-22 MED ORDER — LORAZEPAM 2 MG/ML IJ SOLN
2.0000 mg | INTRAMUSCULAR | Status: DC | PRN
  Administered 2022-12-22: 4 mg via INTRAVENOUS
  Filled 2022-12-22: qty 2

## 2022-12-22 MED ORDER — SODIUM CHLORIDE 0.9 % IV SOLN: 3.0000 g | Freq: Two times a day (BID) | INTRAVENOUS | Status: DC

## 2022-12-22 MED ORDER — LORAZEPAM 2 MG/ML IJ SOLN: 2.0000 mg | INTRAMUSCULAR | Status: DC | PRN

## 2022-12-22 MED ORDER — POLYVINYL ALCOHOL 1.4 % OP SOLN: 1.0000 [drp] | Freq: Four times a day (QID) | OPHTHALMIC | Status: DC | PRN

## 2022-12-22 MED ORDER — SODIUM CHLORIDE 0.9 % IV SOLN: INTRAVENOUS | Status: DC

## 2022-12-22 MED ORDER — LABETALOL HCL 5 MG/ML IV SOLN
10.0000 mg | INTRAVENOUS | Status: DC | PRN
  Administered 2022-12-22 (×2): 10 mg via INTRAVENOUS
  Filled 2022-12-22 (×2): qty 4

## 2022-12-22 MED ORDER — FREE WATER
50.0000 mL | Status: DC
  Administered 2022-12-22 (×3): 50 mL

## 2022-12-22 MED ORDER — GLYCOPYRROLATE 0.2 MG/ML IJ SOLN
0.2000 mg | INTRAMUSCULAR | Status: DC | PRN
  Administered 2022-12-22: 0.2 mg via INTRAVENOUS

## 2022-12-22 MED ORDER — ACETAMINOPHEN 650 MG RE SUPP: 650.0000 mg | Freq: Four times a day (QID) | RECTAL | Status: DC | PRN

## 2022-12-22 MED ORDER — ACETAMINOPHEN 325 MG PO TABS: 650.0000 mg | ORAL_TABLET | Freq: Four times a day (QID) | ORAL | Status: DC | PRN

## 2022-12-22 MED ORDER — FAMOTIDINE 20 MG PO TABS
10.0000 mg | ORAL_TABLET | Freq: Every day | ORAL | Status: DC
  Administered 2022-12-22: 10 mg
  Filled 2022-12-22: qty 1

## 2022-12-22 MED ORDER — MORPHINE BOLUS VIA INFUSION
5.0000 mg | INTRAVENOUS | Status: DC | PRN
  Administered 2022-12-22 – 2022-12-23 (×8): 5 mg via INTRAVENOUS

## 2022-12-22 MED ORDER — MORPHINE 100MG IN NS 100ML (1MG/ML) PREMIX INFUSION
0.0000 mg/h | INTRAVENOUS | Status: DC
  Administered 2022-12-22: 25 mg/h via INTRAVENOUS
  Administered 2022-12-23 (×2): 30 mg/h via INTRAVENOUS
  Filled 2022-12-22 (×3): qty 100

## 2022-12-22 MED ORDER — HALOPERIDOL LACTATE 5 MG/ML IJ SOLN
2.5000 mg | INTRAMUSCULAR | Status: DC | PRN
  Administered 2022-12-22: 5 mg via INTRAVENOUS
  Filled 2022-12-22: qty 1

## 2022-12-22 MED ORDER — GLYCOPYRROLATE 0.2 MG/ML IJ SOLN
0.4000 mg | INTRAMUSCULAR | Status: DC
  Administered 2022-12-22 – 2022-12-23 (×4): 0.4 mg via INTRAVENOUS
  Filled 2022-12-22 (×5): qty 2

## 2022-12-22 MED ORDER — DIPHENHYDRAMINE HCL 50 MG/ML IJ SOLN: 25.0000 mg | INTRAMUSCULAR | Status: DC | PRN

## 2022-12-22 MED ORDER — GLYCOPYRROLATE 0.2 MG/ML IJ SOLN: 0.2000 mg | INTRAMUSCULAR | Status: DC | PRN

## 2022-12-22 MED ORDER — GLYCOPYRROLATE 1 MG PO TABS: 1.0000 mg | ORAL_TABLET | ORAL | Status: DC | PRN

## 2022-12-22 MED ORDER — MORPHINE 100MG IN NS 100ML (1MG/ML) PREMIX INFUSION
0.0000 mg/h | INTRAVENOUS | Status: DC
  Administered 2022-12-22: 5 mg/h via INTRAVENOUS
  Filled 2022-12-22: qty 100

## 2022-12-22 NOTE — Consult Note (Signed)
Consultation Note Date: 12/22/2022   Patient Name: Cody Ortega  DOB: [birth year]  MRN: [id number]  Age / Sex: 62 y.o., male  PCP: Marcine Matar, MD Referring Physician: Charlott Holler, MD  Reason for Consultation: Establishing goals of care  HPI/Patient Profile: 62 y.o. 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   Patient arrived as code stroke and CT was negative, he could not get MRI due to AICD. He was admitted for further workup and then on 8/19 he had new neurological symptoms. CTA showed basilar occlusion requiring thrombectomy complicated by R PCA perforation requiring coil embolization and posterior fossa with residual contrast and/or SAH. Prognosis is very poor. PMT has been consulted to assist with goals of care conversation.  Clinical Assessment and Goals of Care:  I have reviewed medical records including EPIC notes, labs and imaging, received report from RN, assessed the patient and then met at the bedside with patient's son, ex-wife, and best friend to discuss diagnosis prognosis, GOC, EOL wishes, disposition and options.  I introduced Palliative Medicine as specialized medical care for people living with serious illness. It focuses on providing relief from the symptoms and stress of a serious illness. The goal is to improve quality of life for both the patient and the family.  We discussed a brief life review of the patient and then focused on their current illness.   I attempted to elicit values and goals of care important to the patient.    Medical History Review and Understanding:  Patient's family  report a good understanding of the severity of patient's illness after discussions with neurology and critical care.  Social History: Patient has 1 son, Enid Derry, and unfortunately his daughter has died from complications related to cerebral palsy. He has been with his girlfriend for 9 years and remains close with his ex-wife and best friend since 3rd grade who arrived today from [place]. He loved basketball, football, and is described as someone you "do not want to make mad."  Advance Directives: A detailed discussion regarding advanced directives was had. Family is uncertain if patient ever completed AD documentation.   Code Status: Recommended consideration of DNR status, understanding evidenced-based poor outcomes in similar hospitalized patients, as the cause of the arrest is likely associated with chronic/terminal disease rather than a reversible acute cardio-pulmonary event.   Discussion: Initially met with patient's son in the morning who states that more family support is anticipated around 1-2pm for goals of care discussion. I then returned to the bedside and met with patient's ex-wife and best friend. They share that patient would never want wanted to live a life dependent on others for care or dependent on life support. He would not even want to be wheelchair bound. They do not find his quality of life to be acceptable with prolonged medical interventions and express readiness to proceed with compassionate extubation later this evening around 6-7pm when patient's girlfriend arrives.  We discussed what comfort care would look like, reviewing that patient would no longer receive aggressive medical interventions such as continuous vital signs, lab work, radiology testing, or medications not focused on comfort. All care would focus on how the patient is looking and feeling. This would include management of any symptoms that may  cause discomfort, pain, shortness of breath, cough, nausea, agitation,  anxiety, and/or secretions etc. Symptoms would be managed with medications and other non-pharmacological interventions such as spiritual support if requested, repositioning, music therapy, or therapeutic listening. Family verbalized understanding and appreciation.      The difference between aggressive medical intervention and comfort care was considered in light of the patient's goals of care.  Discussed the importance of continued conversation with family and the medical providers regarding overall plan of care and treatment options, ensuring decisions are within the context of the patient's values and GOCs.   Questions and concerns were addressed.  Hard Choices and G[facility] [employer] [facility] booklets left for review. The family was encouraged to call with questions or concerns.  PMT will continue to support holistically.    SUMMARY OF RECOMMENDATIONS   -Code status changed to DNR -Continue current care until patient's girlfriend arrives between 6-7pm then proceed with compassionate extubation and transition to full comfort measures per primary team -Robinul 0.4mg  IV Q4H scheduled in anticipation of secretions with extubation -Discontinued labs, glucose checks -Psychosocial and emotional support provided -PMT will continue to follow and support    Prognosis:  Hours - Days after compassionate extubation  Discharge Planning: Anticipated Hospital Death      Primary Diagnoses: Present on Admission:  CVA (cerebral vascular accident) (HCC)  Essential hypertension  Chronic systolic heart failure (HCC)  HLD (hyperlipidemia)  CAD, NATIVE VESSEL  Automatic implantable cardioverter-defibrillator in situ - NOT MRI compatible  Atrial fibrillation (HCC)  Obstructive sleep apnea  COPD with asthma  Stage 3b chronic kidney disease (HCC)  Acute CVA (cerebrovascular accident) (HCC)  Acute respiratory failure with hypoxia (HCC)   Physical Exam Vitals and nursing note reviewed.  Constitutional:       General: He is not in acute distress.    Appearance: He is ill-appearing.     Interventions: He is intubated.  Cardiovascular:     Rate and Rhythm: Normal rate.  Pulmonary:     Effort: He is intubated.  Neurological:     Mental Status: He is unresponsive.     Deep Tendon Reflexes: Babinski sign absent on the right side.     Vital Signs: BP (!) 157/98   Pulse 70   Temp (!) 100.5 F (38.1 C) (Oral) Comment: tylenol given  Resp 20   Ht 6\' 3"  (1.905 m)   Wt 113.6 kg   SpO2 96%   BMI 31.30 kg/m  Pain Scale: CPOT   Pain Score: Asleep   SpO2: SpO2: 96 % O2 Device:SpO2: 96 % O2 Flow Rate: .    Palliative Assessment/Data: 30% (on tube feeds)     MDM: high   Taige Housman Jeni Salles, PA-C  Palliative Medicine Team Team phone # [phone]  Thank you for allowing the Palliative Medicine Team to assist in the care of this patient. Please utilize secure chat with additional questions, if there is no response within 30 minutes please call the above phone number.  Palliative Medicine Team providers are available by phone from 7am to 7pm daily and can be reached through the team cell phone.  Should this patient require assistance outside of these hours, please call the patient's attending physician.

## 2022-12-22 NOTE — Procedures (Signed)
Extubation Procedure Note  Patient Details:   Name: Cody Ortega DOB: 1961/05/02 MRN: [id number]   Airway Documentation:  Airway 7.5 mm (Active)  Secured at (cm) 27 cm 12/22/22 1542  Measured From Lips 12/22/22 1542  Secured Location Center 12/22/22 1542  Secured By [employer] 12/22/22 1542  Tube Holder Repositioned Yes 12/22/22 1542  Prone position No 12/22/22 1157  Head position Right 12/20/22 0410  Cuff Pressure (cm H2O) Green OR 18-26 [facility] 12/22/22 1542  Site Condition Dry 12/22/22 1542   Vent end date: (not recorded) Vent end time: (not recorded)   Evaluation  O2 sats: stable throughout Complications: No apparent complications Patient did tolerate procedure well. Bilateral Breath Sounds: Diminished   No,  Pt. Extubated to comfort care, RN at bedside   Helane Gunther 12/22/2022, 8:37 PM

## 2022-12-22 NOTE — Progress Notes (Addendum)
STROKE TEAM PROGRESS NOTE   BRIEF HPI This is a 62 yo man with pmhx significant for CAD, CHF, CKD stage IV, HL, HTN, a fib on xarelto, prior stroke who presents with L sided weakness and dysarthria. Outside of window for TNK and no LVO for thrombectomy. Nonadherent to zarelto but could not get exact reason due to dysarthria.    SIGNIFICANT HOSPITAL EVENTS 8/17 evening: presented to ED 8/19: new neurologic sx, CTA basilar occlusion requiring thrombectomy complicated by R PCA perforation requiring coil embolization and posterior fossa with residual contrast and/or SAH.  8/22 transition to comfort care   INTERIM HISTORY/SUBJECTIVE Patient is comatose and no family in the room. Per palliative care provider in afternoon, family would like to transition to comfort care this evening when the girlfriend arrives.     OBJECTIVE  CBC    Component Value Date/Time   WBC 11.3 (H) 12/22/2022 0617   RBC 4.59 12/22/2022 0617   HGB 14.0 12/22/2022 0617   HGB 16.0 06/02/2022 1426   HCT 43.6 12/22/2022 0617   HCT 46.7 06/02/2022 1426   PLT 132 (L) 12/22/2022 0617   PLT 185 06/02/2022 1426   MCV 95.0 12/22/2022 0617   MCV 91 06/02/2022 1426   MCH 30.5 12/22/2022 0617   MCHC 32.1 12/22/2022 0617   RDW 14.6 12/22/2022 0617   RDW 15.1 06/02/2022 1426   LYMPHSABS 1.1 12/17/2022 1748   LYMPHSABS 0.8 10/15/2020 1118   MONOABS 0.6 12/17/2022 1748   EOSABS 0.1 12/17/2022 1748   EOSABS 0.1 10/15/2020 1118   BASOSABS 0.1 12/17/2022 1748   BASOSABS 0.0 10/15/2020 1118    BMET    Component Value Date/Time   NA 146 (H) 12/22/2022 0617   NA 133 (L) 06/02/2022 1426   K 3.8 12/22/2022 0617   CL 110 12/22/2022 0617   CO2 22 12/22/2022 0617   GLUCOSE 148 (H) 12/22/2022 0617   BUN 91 (H) 12/22/2022 0617   BUN 26 (H) 06/02/2022 1426   CREATININE 3.65 (H) 12/22/2022 0617   CREATININE 1.38 (H) 02/08/2016 1155   CALCIUM 8.5 (L) 12/22/2022 0617   EGFR 29 (L) 06/02/2022 1426   GFRNONAA 18 (L) 12/22/2022  0617    IMAGING past 24 hours No results found.  Vitals:   12/22/22 0900 12/22/22 1000 12/22/22 1100 12/22/22 1200  BP:   (!) 145/84 (!) 151/98  Pulse: 71 67 67 88  Resp: 12 18 19  (!) 21  Temp:    98.2 F (36.8 C)  TempSrc:    Axillary  SpO2: 95% 98% 96% 97%  Weight:      Height:         PHYSICAL EXAM General:  intubated and sedated and nonresponsive.  Respiratory:  synchronous with the ventilator.   NEURO:  Mental Status: stupor, does not open eyes to sternal rub. Appear to have mostly extensor (increased from yesterday) responses to painful stimuli.     CN: pupils dilated and nonreactive bilaterally. absent oculocephalic reflex. Present corneal reflex bilaterally, improved from yesterday. Gag intact to endotracheal tube suction.    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 ASPECTS 10.    CT cervical spine no acute fracture or subluxation  CT head 24hr p/ost stroke stable, unchanged Not able to have MRI due to generator and leads  are from different providers CTA head neck 8/19 1835 occluded basilar artery S/p IR with BA recanalization, However, contrast extravasation was noted from the right PCA with was treated with coils + n-BCA. Underlying severe stenosis of the basilar artery was treated with balloon angioplasty.  CT head s/p IR 8/20 0300 hyperdense material in the subarachnoid spaces of the inferomedial parietal lobes, right greater than left occipital lobes, and right greater than left cerebellar hemispheres, favored to represent contrast staining and a component of subarachnoid hemorrhage, which appears roughly unchanged from the intra-procedural CT  CT head repeat 8/20 1330 Minimal fading of subarachnoid contrast/hemorrhage  in the posterior fossa and right more than left occipital convexity. Carotid Doppler b/l ICA 40-59% stenosis 2D Echo EF 20-25%. Negative bubble.  LDL 88 HgbA1c 5.5 VTE prophylaxis - on heparin IV Xarelto (rivaroxaban) daily and aspirin 81 prior to admission, Stopped Xarelto for [facility], on ASA 81  Disposition:  pending, pt now poor outcome and prognosis. Transition to comfort care this evening  GOC - Transition to comfort care Per palliative care discussion with family on 8/22 Compassionate extubation on 8/22 evening when the pt's girlfriend arrives  Hx of Stroke/TIA CVA 16 years ago with residue visual difficulty 10/2021 admitted in [place] for generalized weakness and slurred speech.  CT no acute finding.  CT head and neck multiple severe intracranial stenosis, most notably moderate severe stenosis right M1, short segment proximal right A1, moderate severe stenosis of basilar artery.  Right P2 and P3 segment of occlusion.  Left V4 occlusion, severe near complete atherosclerotic narrowing of bilateral ICAs, right VA origin stenosis.  Continue on aspirin, Xarelto and Lipitor.  Atrial fibrillation Home Meds: Xarelto 20 mg once daily, carvedilol 12.5 mg one and half pills twice daily, amiodarone 200 once dialy On amiodraone, coreg Continue telemetry monitoring ASA 81 mg only current s/p IR w/ SAH  Hypertension Home meds:  carvedilol 12.5 mg one and half pills twice daily, entresto 49-51 mg twice daily, hydralazine 100 mg twice daily  Hypotensive currently from propofol and pressure support with levophed BP goal: SBP 120-160  Hyperlipidemia Home meds:  atorvastatin 10 mg and zetia 10 mg once daily  LDL 88, goal < 70 Increase Lipitor to 20 and continue Zetia 10.  CHF CAD Status post ICD in 2012, generator change out in 2021 Not compatible with MRI due to generator and leads from different companies 09/2020 TEE EF 30-35% This admission EF 20-25% On GDMT at home  Substance Use Patient  uses cannabis UDS positive for THC   Dysphagia Currently intubated Not able to handle oral secretions On TF   Other Stroke Risk Factors Obesity, Body mass index is 31.3 kg/m., BMI >/= 30 associated with increased stroke risk, recommend weight loss, diet and exercise as appropriate  History of cocaine use OSA not on CPAP  Other Active Problems HFrEF s/p AICD, managed by primary team AKI on CKD, Cr 1.7 --> 2.1 (BUN 35) Leuokocytosis, WBC 12.7 --> 15.8 Polycythemia Hb 16.9->17.7->19.0->18.7->17.5  Hospital day # 4  Meryl Dare, MD PGY-1 Psychiatry Resident 12/22/2022, 2:23 PM   ATTENDING NOTE: I reviewed above note and agree with the assessment and plan. Pt was seen and examined.   No family at the bedside. Per RN, pt son was here earlier and plan to meet with palliative care with other family members this afternoon. Pt neurologically unchanged, b/l fixed pupils, weak corneals, present gag and cough, b/l UE and LE posturing with pain stimulation. Still on vent, not  following commands. Pending palliative care GOC discussion with family, continue ASA for now.   For detailed assessment and plan, please refer to above/below as I have made changes wherever appropriate.   Marvel Plan, MD PhD Stroke Neurology 12/22/2022 04:43 PM    This patient is critically ill due to posterior circulation infarct with BA occlusion, respiratory failure, afib, pacemaker and at significant risk of neurological worsening, death form recurrent stroke, hemorrhagic conversion, brain herniation, heart failure. This patient's care requires constant monitoring of vital signs, hemodynamics, respiratory and cardiac monitoring, review of multiple databases, neurological assessment, discussion with family, other specialists and medical decision making of high complexity. I spent 40 minutes of neurocritical care time in the care of this patient. I had long discussion with son ex-wife and partner at bedside, updated  pt current condition, treatment plan and potential prognosis, and answered all the questions. They expressed understanding and appreciation.     To contact Stroke Continuity provider, please refer to [email or web address]. After hours, contact General Neurology

## 2022-12-22 NOTE — Progress Notes (Signed)
SLP Cancellation Note  Patient Details Name: Cody Ortega MRN: [id number] DOB: 05-19-1960   Cancelled treatment:       Reason Eval/Treat Not Completed: Patient not medically ready. Will sign off given poor prognosis.    Ethon Wymer, Riley Nearing 12/22/2022, 8:46 AM

## 2022-12-22 NOTE — Progress Notes (Signed)
Pt bladder scan revealed 700 ml of urine unable to void x 6 hours. Foley placed as plan for pt to transition to comfort care this evening. Family noitified this RN tat this time they are ready to remove ETT. Provider notified awaiting orders.

## 2022-12-22 NOTE — Progress Notes (Signed)
eLink Physician-Brief Progress Note Patient Name: Cody Ortega DOB: [birth year] MRN: [id number]   Date of Service  12/22/2022  HPI/Events of Note  Patient with terminal extubation and comfort measures, despite current maximum doses of sedation, analgesia and anxiolysis he is visibly uncomfortable and the family is visibly distressed.  eICU Interventions  Medications adjusted upwards to optimize comfort and alleviate family distress.        Migdalia Dk 12/22/2022, 9:26 PM

## 2022-12-22 NOTE — Progress Notes (Signed)
NAME:  Cody Ortega, MRN:  [id number], DOB:  06/26/1960, LOS: 4 ADMISSION DATE:  12/17/2022, CONSULTATION DATE:  8/20 REFERRING MD:  Dr. Janalyn Shy, CHIEF COMPLAINT:  CVA   History of Present Illness:  62 year old male with past medical history as below, which is significant for asthma, CAD, HFrEF, CKD, substance abuse history, hypertension, sleep apnea, atrial fibrillation on Xarelto, and AICD in place.  He presented to [facility] emergency department on 8/17 following a fall.  He initially refused EMS transport but continued to have weakness in all 4 extremities as well as dysarthria so he agreed to come to the ED.  Code stroke was activated upon arrival to the ED, however, he was outside the window for any thrombolytic.  Presents sensation thought to be secondary to small stroke.  Symptoms not felt to be likely with LVO.  He was admitted to the hospitalist service with stroke service following.  In the evening hours of 8/19 he was found to have a worsening neuroexam.  NIH worsened to 22 and interventional radiology was activated.  Prior to the interventional procedure the patient was desaturating and was ultimately intubated.  O2 sats were in the low 80s prior to intubation.  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  Contrast extravasation was noted from the right PCA and was treated with coils.  Additional balloon angioplasty to the basilar artery.  Post procedurally he remained on mechanical ventilator and was transferred to the ICU for further monitoring.  PCCM asked to evaluate for ICU care.  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   Significant Hospital Events: Including procedures, antibiotic start and stop dates in addition to other pertinent events   8/17 admit with CVA. Outside window. Not felt to be amenable to IR 8/19 Symptoms worse. To IR. Basilar occlusion s/p thrombectomy, angioplasty. Complicated by contrast extravasation  Interim History / Subjective:  No neurologic change today Neuro spoke w/ family regarding poor neuro prognosis. Awaiting more family to come in to discuss GOC. Palliative care consulted.  Objective   Blood pressure (!) 138/90, pulse 75, temperature (!) 100.5 F (38.1 C), temperature source Oral, resp. rate 19, height 6\' 3"  (1.905 m), weight 113.6 kg, SpO2 97%.    Vent Mode: PRVC FiO2 (%):  [40 %] 40 % Set Rate:  [18 bmp] 18 bmp Vt Set:  [550 mL-670 mL] 670 mL PEEP:  [8 cmH20-10 cmH20] 8 cmH20 Plateau Pressure:  [15 cmH20-18 cmH20] 18 cmH20   Intake/Output Summary (Last 24 hours) at 12/22/2022 0713 Last data filed at 12/22/2022 0600 Gross per 24 hour  Intake 1410 ml  Output 625 ml  Net 785 ml   Filed Weights   12/19/22 0540 12/20/22 0538 12/21/22 0500  Weight: 110.3 kg 113.6 kg 113.6 kg    Examination: General:  critically ill appearing on mech vent HEENT: MM pink/moist; ETT in place Neuro: pupils 8mm b/l non reactive to light; withdraws to painful stimuli all extremities; cough/gag present CV: s1s2, RRR, no  m/r/g PULM:  dim clear BS bilaterally; on mech vent PRVC GI: soft, bsx4 active  Extremities: warm/dry, no edema  Skin: no rashes or lesions   Resolved Hospital Problem list     Assessment & Plan:   Acute CVA; cardioembolic , non-compliant with  Xarelto Mid basilar occlusion s/p mechanical thrombectomy and balloon angioplasty complicated by contrast extravasation +/- SAH Plan: -neuro following; appreciate recs -plan for family meeting w/ palliative care to discuss GOC  -frequent neuro checks -limit sedating meds; hold sedation -SBP goal 120-160 per neuro IR; consider resuming home bp meds when appropriate -statin, DAPT, AC per neuro -Continue neuroprotective measures- normothermia, euglycemia, HOB greater than 30, head in neutral alignment, normocapnia, normoxia.   Acute hypoxemic respiratory failure RUL atelectasis vs. Infiltrate-Possible aspiration pneumonia COPD OSA Plan: -no plan for extubation given poor mental status -LTVV strategy with tidal volumes of 6-8 cc/kg ideal body weight -Wean PEEP/FiO2 for SpO2 >92% -VAP bundle in place -Daily SAT and SBT -PAD protocol in place -wean sedation for RASS goal 0 to -1 -triple therapy nebs -unasyn for aspiration ppx -f/u trach aspirate  HFrEF: LVEF 20-25% Atrial fibrillation on Xarelto CAD HTN Plan: -hold AC; when to resume per stroke team -tele monitoring -cont amio per tube -hold GDMT for now  AKI on CKD III Plan: -Trend BMP / urinary output -Replace electrolytes as indicated -Avoid nephrotoxic agents, ensure adequate renal perfusion  Nutrition Dysphagia Plan: -TF via cortrak  Thrombocytopenia Plan: -trend cbc  Hx of anxiety Plan: -hold xanax for now given poor mental status  Substance abuse - THC and benzo on UDS Plan: -counseling when appropriate   Best Practice (right click and "Reselect all SmartList Selections" daily)   Diet/type: tubefeeds DVT prophylaxis: SCD GI prophylaxis: H2B Lines: Arterial Line Foley:  N/A Code Status:  full code Last date of multidisciplinary goals of care discussion [8/22 updated girlfriend at bedside. Palliative and neuro to have goc talk hopefully later today when family arrives]  Labs   CBC: Recent Labs   Lab 12/17/22 1748 12/17/22 1753 12/19/22 0846 12/19/22 2233 12/20/22 0002 12/20/22 0740 12/21/22 0500 12/21/22 1735 12/22/22 0617  WBC 9.4  --  12.7*  --   --  15.8* 17.7*  --  11.3*  NEUTROABS 7.5  --   --   --   --   --   --   --   --   HGB 16.9   < > 19.0*   < > 16.7 17.5* 15.6 14.6 14.0  HCT 50.5   < > 54.8*   < > 49.0 53.3* 47.0 43.0 43.6  MCV 90.7  --  90.4  --   --  91.7 92.9  --  95.0  PLT 170  --  195  --   --  177 161  --  132*   < > = values in this interval not displayed.    Basic Metabolic Panel: Recent Labs  Lab 12/17/22 1748 12/17/22 1753 12/18/22 0730 12/19/22 1106 12/19/22 2233 12/20/22 0002 12/20/22 0740 12/21/22 0500 12/21/22 1735  NA 141 142 141 141 142 141 138 140 144  K 3.6 4.5 3.1* 4.1 4.7 4.5 4.1 3.9 4.1  CL 110 111 109 108  --   --  108 110  --   CO2 20*  --  21* 20*  --   --  20* 21*  --   GLUCOSE 129* 123* 122* 119*  --   --  149* 132*  --   BUN  19 30* 18 19  --   --  35* 57*  --   CREATININE 1.92* 1.90* 1.79* 1.71*  --   --  2.07* 2.77*  --   CALCIUM 9.4  --  9.2 9.4  --   --  9.1 8.9  --   MG  --   --  2.0 1.9  --   --   --  2.2  --   PHOS  --   --  2.8 2.6  --   --   --   --   --    GFR: Estimated Creatinine Clearance: 38.5 mL/min (A) (by C-G formula based on SCr of 2.77 mg/dL (H)). Recent Labs  Lab 12/17/22 1812 12/19/22 0846 12/20/22 0740 12/20/22 1202 12/21/22 0500 12/22/22 0617  PROCALCITON  --   --   --  <0.10  --   --   WBC  --  12.7* 15.8*  --  17.7* 11.3*  LATICACIDVEN 1.2  --   --   --   --   --     Liver Function Tests: Recent Labs  Lab 12/17/22 1748 12/18/22 0730 12/19/22 1106  AST 14*  --   --   ALT 13  --   --   ALKPHOS 57  --   --   BILITOT 0.9  --   --   PROT 6.8  --   --   ALBUMIN 3.9 3.7 3.8   No results for input(s): "LIPASE", "AMYLASE" in the last 168 hours. Recent Labs  Lab 12/19/22 1656  AMMONIA 32    ABG    Component Value Date/Time   PHART 7.351 12/21/2022 1735   PCO2ART 40.8  12/21/2022 1735   PO2ART 99 12/21/2022 1735   HCO3 22.3 12/21/2022 1735   TCO2 23 12/21/2022 1735   ACIDBASEDEF 3.0 (H) 12/21/2022 1735   O2SAT 97 12/21/2022 1735     Coagulation Profile: Recent Labs  Lab 12/17/22 1748  INR 1.0    Cardiac Enzymes: No results for input(s): "CKTOTAL", "CKMB", "CKMBINDEX", "TROPONINI" in the last 168 hours.  HbA1C: Hgb A1c MFr Bld  Date/Time Value Ref Range Status  12/18/2022 01:14 AM 5.5 4.8 - 5.6 % Final    Comment:    (NOTE) Pre diabetes:          5.7%-6.4%  Diabetes:              >6.4%  Glycemic control for   <7.0% adults with diabetes   12/09/2020 02:58 PM 5.6 4.8 - 5.6 % Final    Comment:             Prediabetes: 5.7 - 6.4          Diabetes: >6.4          Glycemic control for adults with diabetes: <7.0     CBG: Recent Labs  Lab 12/21/22 1124 12/21/22 1522 12/21/22 1945 12/21/22 2351 12/22/22 0348  GLUCAP 130* 138* 143* 129* 125*    Review of Systems:   Patient is encephalopathic and/or intubated; therefore, history has been obtained from chart review.    Past Medical History:  He,  has a past medical history of Anxiety, Arthritis, Benzodiazepine dependence (HCC), Bronchial asthma, CAD (coronary artery disease), Cardiomyopathy, Chronic systolic congestive heart failure (HCC), Cirrhosis (HCC), CKD (chronic kidney disease), stage IV (HCC), Depression, Diverticulosis, Hepatitis B, History of cocaine abuse (HCC), Hyperlipidemia LDL goal <70, Hypermetropia of both eyes not needing correction (09/29/2020), Hypertension, Hypertensive retinopathy, Microcytic anemia, Obesity, Persistent  atrial fibrillation (HCC), S/P implantation of automatic cardioverter/defibrillator (AICD), Sleep apnea, Stroke (HCC) (2004), and Thyroid disease.   Surgical History:   Past Surgical History:  Procedure Laterality Date   CARDIOVERSION N/A 01/07/2014   Procedure: CARDIOVERSION;  Surgeon: Lewayne Bunting, MD;  Location: [facility] ENDOSCOPY;  Service:  Cardiovascular;  Laterality: N/A;   CARDIOVERSION N/A 02/11/2014   Procedure: CARDIOVERSION;  Surgeon: Chrystie Nose, MD;  Location[facility] ENDOSCOPY;  Service: Cardiovascular;  Laterality: N/A;   CARDIOVERSION N/A 10/01/2020   Procedure: CARDIOVERSION;  Surgeon: Christell Constant, MD;  Location: [facility] ENDOSCOPY;  Service: Cardiovascular;  Laterality: N/A;   CATARACT EXTRACTION     COLONOSCOPY WITH PROPOFOL N/A 02/06/2018   Procedure: COLONOSCOPY WITH PROPOFOL;  Surgeon: Benancio Deeds, MD;  Location: [facility] ENDOSCOPY;  Service: Gastroenterology;  Laterality: N/A;   EYE SURGERY     ICD GENERATOR CHANGEOUT N/A 01/17/2020   Procedure: ICD GENERATOR CHANGEOUT;  Surgeon: Marinus Maw, MD;  Location: [facility] INVASIVE CV LAB;  Service: Cardiovascular;  Laterality: N/A;   LASEK eye surgery Bilateral 05/2018   LASIK Bilateral    PACEMAKER INSERTION     POLYPECTOMY  02/06/2018   Procedure: POLYPECTOMY;  Surgeon: Benancio Deeds, MD;  Location: [facility] ENDOSCOPY;  Service: Gastroenterology;;   RADIOLOGY WITH ANESTHESIA N/A 12/19/2022   Procedure: RADIOLOGY WITH ANESTHESIA;  Surgeon: Radiologist, Medication, MD;  Location: [facility] OR;  Service: Radiology;  Laterality: N/A;   TEE WITHOUT CARDIOVERSION N/A 01/07/2014   Procedure: TRANSESOPHAGEAL ECHOCARDIOGRAM (TEE);  Surgeon: Lewayne Bunting, MD;  Location: [facility] ENDOSCOPY;  Service: Cardiovascular;  Laterality: N/A;   TEE WITHOUT CARDIOVERSION N/A 02/11/2014   Procedure: TRANSESOPHAGEAL ECHOCARDIOGRAM (TEE);  Surgeon: Chrystie Nose, MD;  Location: [facility] ENDOSCOPY;  Service: Cardiovascular;  Laterality: N/A;   TEE WITHOUT CARDIOVERSION N/A 10/01/2020   Procedure: TRANSESOPHAGEAL ECHOCARDIOGRAM (TEE);  Surgeon: Christell Constant, MD;  Location: [facility] ENDOSCOPY;  Service: Cardiovascular;  Laterality: N/A;   TONSILLECTOMY     TRANSTHORACIC ECHOCARDIOGRAM  10/2006, 12/2009     Social History:   reports that he quit smoking about 12 years ago. His smoking use  included cigarettes. He started smoking about 13 years ago. He has a 0.2 pack-year smoking history. He has never used smokeless tobacco. He reports that he does not currently use alcohol. He reports that he does not use drugs.   Family History:  His family history includes Breast cancer in his mother; Cerebral palsy in his daughter; Diabetes in his father; Multiple myeloma in his mother; Peripheral vascular disease in his father; Prostate cancer in his father. There is no history of Lung disease, Colon cancer, Stomach cancer, Esophageal cancer, or Colon polyps.   Allergies Allergies  Allergen Reactions   Prochlorperazine Other (See Comments)    Medication caused a lot of issues like blurred vision, nausea, pain, and carpool turnel   Codeine Itching, Nausea And Vomiting and Hives   Hydrocodone-Acetaminophen Itching and Nausea And Vomiting     Home Medications  Prior to Admission medications   Medication Sig Start Date End Date Taking? Authorizing Provider  acetaminophen (TYLENOL) 500 MG tablet Take 1,000 mg by mouth every 6 (six) hours as needed for moderate pain or headache.   Yes [provider]  albuterol (VENTOLIN HFA) 108 (90 Base) MCG/ACT inhaler INHALE 2 PUFFS INTO THE LUNGS EVERY 6 HOURS AS NEEDED FOR WHEEZING OR SHORTNESS OF BREATH 12/13/22  Yes Marcine Matar, MD  alprazolam Prudy Feeler) 2 MG tablet Take 2 mg by  mouth daily as needed for anxiety.   Yes [provider]  Artificial Tear Solution (TEARS NATURALE OP) Place 1 drop into both eyes daily as needed (dry eyes).   Yes [provider]  aspirin EC 81 MG tablet Take 81 mg by mouth daily. Swallow whole.   Yes [provider]  atorvastatin (LIPITOR) 10 MG tablet TAKE 1 TABLET(10 MG) BY MOUTH DAILY 11/21/22  Yes Tonny Bollman, MD  carvedilol (COREG) 12.5 MG tablet TAKE 1 AND 1/2 TABLETS BY MOUTH TWICE DAILY 12/13/22  Yes Marcine Matar, MD  dapagliflozin propanediol (FARXIGA) 10 MG TABS tablet  Take 1 tablet (10 mg total) by mouth daily before breakfast. 04/14/22  Yes Tonny Bollman, MD  entecavir (BARACLUDE) 0.5 MG tablet Take 1 tablet (0.5 mg total) by mouth every other day. 08/16/22  Yes Armbruster, Willaim Rayas, MD  ENTRESTO 49-51 MG TAKE 1 TABLET BY MOUTH TWICE DAILY 02/15/22  Yes Tonny Bollman, MD  ezetimibe (ZETIA) 10 MG tablet TAKE 1 TABLET(10 MG) BY MOUTH DAILY 08/29/22  Yes Tonny Bollman, MD  furosemide (LASIX) 40 MG tablet TAKE 1/2 TABLET(20 MG) BY MOUTH DAILY 12/29/21  Yes Tonny Bollman, MD  hydrALAZINE (APRESOLINE) 100 MG tablet TAKE 1 TABLET(100 MG) BY MOUTH IN THE MORNING AND AT BEDTIME 04/26/22  Yes Nahser, Deloris Ping, MD  ondansetron (ZOFRAN-ODT) 8 MG disintegrating tablet Take 8 mg by mouth daily. 12/11/19  Yes [provider]  Rectal Protectant-Emollient (CALMOL-4) 76-10 % SUPP Use as directed once to twice daily 04/08/22  Yes Armbruster, Willaim Rayas, MD  RESTASIS 0.05 % ophthalmic emulsion Place 1 drop into both eyes in the morning. 11/25/21  Yes [provider]  sildenafil (REVATIO) 20 MG tablet Take 1 tablet (20 mg total) by mouth as needed. 12/03/21  Yes Tonny Bollman, MD  SYMBICORT 160-4.5 MCG/ACT inhaler INHALE 2 PUFFS INTO THE LUNGS TWICE DAILY 11/10/21  Yes Marcine Matar, MD  tamsulosin (FLOMAX) 0.4 MG CAPS capsule TAKE 1 CAPSULE(0.4 MG) BY MOUTH DAILY 10/25/22  Yes Marcine Matar, MD  XARELTO 20 MG TABS tablet TAKE 1 TABLET(20 MG) BY MOUTH DAILY WITH SUPPER 09/28/22  Yes Camnitz, Will Daphine Deutscher, MD  amiodarone (PACERONE) 200 MG tablet Take 1 tablet (200 mg total) by mouth daily. Patient not taking: Reported on 12/17/2022 12/09/21   Regan Lemming, MD  DULoxetine (CYMBALTA) 30 MG capsule Take 90 mg by mouth daily with breakfast. Patient not taking: Reported on 12/17/2022    [provider]  fluticasone (FLONASE) 50 MCG/ACT nasal spray SHAKE LIQUID AND USE 1 SPRAY IN [facility] NOSTRIL DAILY Patient not taking: Reported on 12/17/2022 12/13/22    Marcine Matar, MD  polyethylene glycol (MIRALAX) 17 g packet Take 17 g by mouth 2 (two) times daily. Take as directed, Daily, titrate as needed Patient not taking: Reported on 12/17/2022 04/08/22   Benancio Deeds, MD  zaleplon (SONATA) 10 MG capsule Take 10 mg by mouth at bedtime. Patient not taking: Reported on 12/17/2022 09/20/22   [provider]     Critical care time: 35 minutes     JD Anselm Lis [facility] 12/22/2022, 7:13 AM  Please see [email or web address] for pager details.  From 7A-7P if no response, please call [phone]. After hours, please call ELink [phone].

## 2022-12-23 DIAGNOSIS — Z515 Encounter for palliative care: Secondary | ICD-10-CM

## 2022-12-23 LAB — CULTURE, RESPIRATORY W GRAM STAIN: Culture: NORMAL

## 2022-12-27 ENCOUNTER — Other Ambulatory Visit (HOSPITAL_COMMUNITY): Payer: Self-pay | Admitting: Neuroradiology

## 2022-12-27 ENCOUNTER — Encounter (HOSPITAL_COMMUNITY): Payer: Self-pay

## 2022-12-27 DIAGNOSIS — I6521 Occlusion and stenosis of right carotid artery: Secondary | ICD-10-CM

## 2022-12-27 HISTORY — PX: IR TRANSCATH/EMBOLIZ: IMG695

## 2022-12-27 HISTORY — PX: IR NEURO EACH ADD'L AFTER BASIC UNI RIGHT (MS): IMG5374

## 2023-01-01 NOTE — Progress Notes (Signed)
Heart Failure Navigator Progress Note  Assessed for Heart & Vascular TOC clinic readiness.  Patient does not meet criteria due to patient being transitioned to comfort care. .   Navigator will sign off at this time.   Rhae Hammock, BSN, Scientist, clinical (histocompatibility and immunogenetics) Only

## 2023-01-01 NOTE — Progress Notes (Signed)
Brief Palliative Medicine Progress Note:  Chart review performed - noted patient passed away this morning at 0645 under full comfort measures.  Thank you for allowing PMT to assist in the care of this patient.  Nayvie Lips M. Katrinka Blazing [facility] Palliative Medicine Team Team Phone: [phone] NO CHARGE

## 2023-01-01 NOTE — Progress Notes (Deleted)
Nutrition Brief Note  Chart reviewed. Pt has now transitioned to comfort care. Tube feeds have been discontinued.   No further nutrition interventions planned at this time.  Please re-consult as needed.   Kirby Crigler RD, LDN Clinical Dietitian See Loretha Stapler for contact information.

## 2023-01-01 NOTE — Progress Notes (Cosign Needed Addendum)
Pt is on DNR comfort care measures code status. This RN and Crisoforo Oxford, RN pronounced pt time of death per provider's order at [phone]. Pts grandson and pts girlfriend of 9 years was at bedside upon time of death, peacefully grieving. Pt died comfortably on morphine drip. 50cc residual drug wasted with Crisoforo Oxford, RN as the witness on the stericycle. Will facilitate pts post mortem care.

## 2023-01-01 NOTE — Death Summary Note (Signed)
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 s/p thrombectomy complicated by right PCA perforation requiring coil embolization and SAH in posterior fossa   Diagnoses  Preliminary cause of death: acute respiratory insufficiency Secondary Diagnoses (including complications and co-morbidities):  Principal Problem:   CVA (cerebral vascular accident) (HCC) Active Problems:   HLD (hyperlipidemia)   Essential hypertension   Chronic systolic heart failure (HCC)   CAD, NATIVE VESSEL   Automatic implantable cardioverter-defibrillator in situ - NOT MRI compatible   Atrial fibrillation (HCC)   Obstructive sleep apnea   COPD with asthma   Stage 3b chronic kidney disease (HCC)   Acute respiratory failure with hypoxia (HCC)   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 Outside of window for TNK and no LVO for thrombectomy. Nonadherent to zarelto but could not get exact reason due to dysarthria. Patient could not receive MRI due to ICD of more than 1 company and CTA was not performed initially due to concern for renal insufficiency. Pt was originally stable from respiratory standpoint but declined during hospital day  2. CTA was ordered given the benefits of brain imaging outweighed the risk for acute kidney injury and demonstrated severe stenosis of basilar artery. Pt was intubated and went to IR for manual thrombectomy complicated by R PCA perforation requiring coil embolization and posterior fossa with residual contrast and/or SAH. Following procedure, pt remained intubated and had neurologic worsening including loss of brainstem reflexes. Family was made aware of the poor neurologic prognosis and high risk for mortality due to brainstem involved. Family made decision on 8/22 to pursue comfort care measures and was one-way extubated. He passed in morning of 31-Dec-2022 due to acute respiratory insufficiency per note from [facility] [place] and [place].   See end of note for documented testing, procedures, and treatment.     Pertinent Labs and Studies  Significant Diagnostic Studies DG CHEST PORT 1 VIEW  Result Date: 12/21/2022 CLINICAL DATA:  Acute respiratory failure with hypoxia. EXAM: PORTABLE CHEST 1 VIEW COMPARISON:  December 20, 2022. FINDINGS: The heart size and mediastinal contours are within normal limits. Endotracheal and feeding tubes are unchanged. Left-sided defibrillator is unchanged. Left lung is clear. Minimal right basilar subsegmental atelectasis or possibly infiltrate is noted. The visualized skeletal structures are unremarkable. IMPRESSION: Stable support apparatus.  Right basilar opacity is noted above. Electronically Signed   By: Lupita Raider M.D.   On: 12/21/2022 09:09   CT HEAD WO CONTRAST ( )  Result Date: 12/20/2022 CLINICAL DATA:  Stroke follow-up EXAM: CT HEAD WITHOUT CONTRAST TECHNIQUE: Contiguous axial images were obtained from the base of the skull through the vertex without intravenous contrast. RADIATION DOSE REDUCTION: This exam  was performed according to the departmental dose-optimization program which includes automated exposure control, adjustment of the mA and/or  kV according to patient size and/or use of iterative reconstruction technique. COMPARISON:  Head CT from earlier today FINDINGS: Brain: Subarachnoid blood/contrast along the superior cerebellar convexities and along the right more than left occipital convexities is slightly decreased in density. No evidence of interval bleeding. Chronic pontine and right deep gray nucleus infarcts. Expected acute infarct in the posterior circulation but difficult to detect in the setting of subarachnoid high-density. No hydrocephalus. Vascular: No hyperdense vessel or unexpected calcification. Skull: Normal. Negative for fracture or focal lesion. Sinuses/Orbits: No acute finding. IMPRESSION: Minimal fading of subarachnoid contrast/hemorrhage in the posterior fossa and right more than left occipital convexity. No new abnormality. Electronically Signed   By: [name] M.D.   On: 12/20/2022 17:16   Portable Chest xray  Result Date: 12/20/2022 CLINICAL DATA:  History of stroke and right lung atelectasis EXAM: PORTABLE CHEST 1 VIEW COMPARISON:  Chest radiograph dated 12/20/2022 at 1:38 a.m. FINDINGS: Lines/tubes: Endotracheal tube tip projects 6.1 cm above the carina Enteric tube tip reaches the diaphragm and terminates below the field of view. Left chest wall ICD lead projects over the right ventricle. Lungs: Slightly increased right lung volume loss with right upper lobe atelectasis. Pleura: No pneumothorax or pleural effusion. Heart/mediastinum: Similar mildly enlarged cardiomediastinal silhouette. Bones: No acute osseous abnormality. IMPRESSION: 1. Slightly increased right lung volume loss with right upper lobe atelectasis. 2. Endotracheal tube tip projects 6.1 cm above the carina. Electronically Signed   By: Agustin Cree M.D.   On: 12/20/2022 07:49   CT HEAD WO CONTRAST ( )  Result Date: 12/20/2022 CLINICAL DATA:  Status post thrombectomy EXAM: CT HEAD WITHOUT CONTRAST TECHNIQUE: Contiguous axial images were obtained  from the base of the skull through the vertex without intravenous contrast. RADIATION DOSE REDUCTION: This exam was performed according to the departmental dose-optimization program which includes automated exposure control, adjustment of the mA and/or kV according to patient size and/or use of iterative reconstruction technique. COMPARISON:  12/24/2022 CTA head and neck, intra procedural flat panel CT. FINDINGS: Brain: Hyperdense material in the subarachnoid spaces of the inferomedial parietal lobes, right greater than left occipital lobes, and right greater than left cerebellar hemispheres, which roughly correlates with the area of hyperdensity on the prior CT, favored to represent contrast staining and a component of subarachnoid hemorrhage. No evidence of mass, mass effect, or midline shift. No hydrocephalus. Redemonstrated hypodensity in the right parieto-occipital region. Vascular: Atherosclerotic calcifications in the intracranial carotid and vertebral arteries. Contrast is noted in the vasculature. Interval placement of coils in the proximal right PCA territory. Skull: Negative for fracture or focal lesion. Sinuses/Orbits: Mild mucosal thickening in the ethmoid air cells. Partially imaged nasogastric tube. Other: The mastoid air cells are well aerated. IMPRESSION: Hyperdense material in the subarachnoid spaces of the inferomedial parietal lobes, right greater than left occipital lobes, and right greater than left cerebellar hemispheres, favored to represent contrast staining and a component of subarachnoid hemorrhage, which appears roughly unchanged from the intra-procedural CT. Electronically Signed   By: Wiliam Ke M.D.   On: 12/20/2022 03:45   DG CHEST PORT 1 VIEW  Result Date: 12/20/2022 CLINICAL DATA:  Respiratory failure EXAM: PORTABLE CHEST 1 VIEW COMPARISON:  11/28/2021 FINDINGS: Endotracheal tube is seen 6 cm above the carina. Nasoenteric feeding tube extends into the upper abdomen beyond the  margin of the examination. There is paramediastinal soft tissue thickening in  keeping with right upper lobe collapse with resultant right-sided volume loss and elevation of the right hemidiaphragm. Left lung is clear. No pneumothorax or pleural effusion. Cardiac size is mildly enlarged, unchanged. Left subclavian single lead pacemaker defibrillator is unchanged. No acute bone abnormality. IMPRESSION: 1. Right upper lobe collapse with resultant right-sided volume loss and elevation of the right hemidiaphragm. 2. Support tubes in expected position. Electronically Signed   By: Helyn Numbers M.D.   On: 12/20/2022 02:22   CT ANGIO HEAD NECK W WO CM  Result Date: 12/30/2022 CLINICAL DATA:  Stroke suspected, atrial fibrillation, dysarthria EXAM: CT ANGIOGRAPHY HEAD AND NECK WITH AND WITHOUT CONTRAST TECHNIQUE: Multidetector CT imaging of the head and neck was performed using the standard protocol during bolus administration of intravenous contrast. Multiplanar CT image reconstructions and MIPs were obtained to evaluate the vascular anatomy. Carotid stenosis measurements (when applicable) are obtained utilizing NASCET criteria, using the distal internal carotid diameter as the denominator. RADIATION DOSE REDUCTION: This exam was performed according to the departmental dose-optimization program which includes automated exposure control, adjustment of the mA and/or kV according to patient size and/or use of iterative reconstruction technique. CONTRAST:  60mL OMNIPAQUE IOHEXOL 350 MG/ML SOLN COMPARISON:  12/18/2022 CT head FINDINGS: CT HEAD FINDINGS Brain: No evidence of acute infarct, hemorrhage, mass, mass effect, or midline shift. No hydrocephalus or extra-axial fluid collection. Periventricular white matter changes, likely the sequela of chronic small vessel ischemic disease. Multiple lacunar infarcts in the bilateral basal ganglia. Vascular: No hyperdense vessel. Atherosclerotic calcifications in the intracranial  carotid and vertebral arteries. Skull: Negative for fracture or focal lesion. Sinuses/Orbits: No acute finding. Other: The mastoid air cells are well aerated. CTA NECK FINDINGS Evaluation is limited by bolus timing and motion. Aortic arch: Standard branching. Imaged portion shows no evidence of aneurysm or dissection. No significant stenosis of the major arch vessel origins. Aortic atherosclerosis. Right carotid system: 75% stenosis in the proximal right ICA (series 7, image 158), with additional 50% stenosis in the more distal right ICA (series 7, image 21). Left carotid system: 60% stenosis in the proximal to mid left ICA (series 7, image 166). Vertebral arteries: Severe stenosis at the origin of right vertebral artery, with moderate stenosis in the right V2 segment (series 7, image 145). No significant stenosis in the extracranial left vertebral artery, although evaluation is significantly limited by bolus timing. Skeleton: No acute osseous abnormality. Degenerative changes in the cervical spine. Poor dentition no acute finding. Nasogastric tube. Other neck: No acute finding. Upper chest: No focal pulmonary opacity or pleural effusion. Review of the MIP images confirms the above findings CTA HEAD FINDINGS Evaluation is significantly limited by bolus timing, with the majority of the contrast in the venous system. Within this limitation, there is occlusion of the mid to distal left V4 (series 7, image 222) and occlusion of the basilar artery (series 7, image 254). The basilar reconstitutes distally, with flow seen in the proximal left and possibly right superior cerebellar artery (series 7, image 263), likely the of the posterior communicating arteries, which are not well seen. The posterior cerebral arteries are not well opacified. Poor opacification of the internal carotid arteries, moderate calcification in the bilateral cavernous and supraclinoid segments. Mild stenosis in the proximal right A1, with patent  bilateral A1 segments and likely patent proximal A2 segments. The left M1 and M2 segments appear patent. The right M1 is not well seen, with likely patent M2 segments. Venous sinuses: Well opacified, patent. Anatomic variants: None  significant. Review of the MIP images confirms the above findings IMPRESSION: 1. Evaluation is significantly limited by bolus timing and motion. Recommend repeat CTA of the head. 2. Occlusion of the mid to distal left V4 and basilar artery, with reconstitution of the basilar artery distally, likely from the posterior communicating arteries. 3. 75% stenosis in the proximal right ICA, with additional 50% stenosis in the more distal right ICA. 4. 60% stenosis in the proximal to mid left ICA. 5. Severe stenosis at the origin of the right vertebral artery, with moderate stenosis in the right V2 segment. 6. No acute intracranial process. 7. Aortic atherosclerosis. Aortic Atherosclerosis (ICD10-I70.0). These results will be called to the ordering clinician or representative by the Radiologist Assistant, and communication documented in the PACS or [employer]. Electronically Signed   By: Wiliam Ke M.D.   On: 12/06/2022 20:05   VAS US CAROTID (at [facility] and [facility] only)  Result Date: 12/29/2022 Carotid Arterial Duplex Study Patient Name:  Cody Ortega  Date of Exam:   12/18/2022 Medical Rec #: [id number]         Accession #:    [id number] Date of Birth: 1960/12/10         Patient Gender: M Patient Age:   22 years Exam Location:  [facility] Procedure:      VAS US CAROTID Referring Phys: ERIC CHEN --------------------------------------------------------------------------------  Indications:       Speech disturbance and Weakness. Hypertensive emergency (BP                    187/127 at time of exam) Risk Factors:      Hypertension, coronary artery disease, prior CVA. Other Factors:     CHF, CKD IV, polysubstance abuse, atrial fibrillation, on                    Xarelto. Limitations         Today's exam was limited due to constant movement, patient                    wanting to sit up straight during exam, altered mental                    status, heavy plaquing in bilateral ICAs. Comparison Study:  Prior carotid duplex done at outside facility 11/14/2021                    indicated 50-69% right ICA stenosis by ICA/CCA systolic                    ratio. Performing Technologist: Sherren Kerns RVS  Examination Guidelines: A complete evaluation includes B-mode imaging, spectral Doppler, color Doppler, and power Doppler as needed of all accessible portions of each vessel. Bilateral testing is considered an integral part of a complete examination. Limited examinations for reoccurring indications may be performed as noted.  Right Carotid Findings: +----------+--------+--------+--------+----------------------+-----------------+           PSV cm/sEDV cm/sStenosisPlaque Description    Comments          +----------+--------+--------+--------+----------------------+-----------------+ CCA Prox  42      9               heterogenous                            +----------+--------+--------+--------+----------------------+-----------------+ CCA Distal40      7  heterogenous                            +----------+--------+--------+--------+----------------------+-----------------+ ICA Prox  192     58      40-59%  calcific and irregularcannot rule out                                                           higher velocities                                                         secondary to                                                              plaque formation                                                          and study                                                                 difficulty        +----------+--------+--------+--------+----------------------+-----------------+ ICA Mid   24      7                                                        +----------+--------+--------+--------+----------------------+-----------------+ ICA Distal23      10                                                      +----------+--------+--------+--------+----------------------+-----------------+ ECA       75      11              calcific                                +----------+--------+--------+--------+----------------------+-----------------+ +----------+--------+-------+--------+-------------------+           PSV cm/sEDV cmsDescribeArm Pressure (mmHG) +----------+--------+-------+--------+-------------------+ Subclavian102                                        +----------+--------+-------+--------+-------------------+ +---------+--------+--+--------+-+  VertebralPSV cm/s45EDV cm/s6 +---------+--------+--+--------+-+  Left Carotid Findings: +----------+--------+--------+--------+----------------------+-----------------+           PSV cm/sEDV cm/sStenosisPlaque Description    Comments          +----------+--------+--------+--------+----------------------+-----------------+ CCA Prox  87      25              heterogenous                            +----------+--------+--------+--------+----------------------+-----------------+ CCA Distal88      32              heterogenous                            +----------+--------+--------+--------+----------------------+-----------------+ ICA Prox  123     43      40-59%  calcific and irregularcannot rule out                                                           higher velocities                                                         secondary to                                                              plaque formation                                                          and study                                                                 difficulty         +----------+--------+--------+--------+----------------------+-----------------+ ICA Mid   116     43              calcific                                +----------+--------+--------+--------+----------------------+-----------------+ ICA Distal132     61              calcific                                +----------+--------+--------+--------+----------------------+-----------------+ ECA       90      5  calcific                                +----------+--------+--------+--------+----------------------+-----------------+ +----------+--------+--------+--------+-------------------+           PSV cm/sEDV cm/sDescribeArm Pressure (mmHG) +----------+--------+--------+--------+-------------------+ [id number]                                          +----------+--------+--------+--------+-------------------+ +---------+--------+--+--------+-+ VertebralPSV cm/s46EDV cm/s5 +---------+--------+--+--------+-+   Summary: Right Carotid: Velocities in the right ICA are consistent with a 40-59%                stenosis. Cannot rule out higher velocities secondary to plaque                formation and study difficulty. Left Carotid: Velocities in the left ICA are consistent with a 40-59% stenosis.               Cannot rule out higher velocities secondary to plaque formation               and study difficulty. Vertebrals:  Bilateral vertebral arteries demonstrate antegrade flow. Subclavians: Normal flow hemodynamics were seen in bilateral subclavian              arteries. *See table(s) above for measurements and observations.  Electronically signed by Delia Heady MD on 12/18/2022 at 6:19:06 PM.    Final    DG Abd Portable 1V  Result Date: 12/29/2022 CLINICAL DATA:  Encounter for feeding tube placement. EXAM: PORTABLE ABDOMEN - 1 VIEW COMPARISON:  None Available. FINDINGS: Tip of the weighted enteric tube is in the left upper quadrant in the region of the mid stomach.  Nonobstructive upper abdominal bowel gas pattern. IMPRESSION: Tip of the weighted enteric tube in the left upper quadrant in the region of the mid stomach. Electronically Signed   By: Narda Rutherford M.D.   On: 12/22/2022 16:05   ECHOCARDIOGRAM COMPLETE BUBBLE STUDY  Result Date: 12/24/2022    ECHOCARDIOGRAM REPORT   Patient Name:   Cody Ortega Date of Exam: 12/02/2022 Medical Rec #:  [id number]        Height:       75.0 in Accession #:    [id number]       Weight:       243.2 lb Date of Birth:  11-09-1960        BSA:          2.384 m Patient Age:    62 years         BP:           207/119 mmHg Patient Gender: M                HR:           79 bpm. Exam Location:  Inpatient Procedure: 2D Echo, Cardiac Doppler, Color Doppler and Saline Contrast Bubble            Study Indications:    stroke  History:        Patient has prior history of Echocardiogram examinations, most                 recent 10/01/2020. CAD, Defibrillator, COPD; Risk                 Factors:Hypertension, Dyslipidemia and Sleep Apnea.  Sonographer:  Delcie Roch RDCS Referring Phys: [id number] ERIC CHEN  Sonographer Comments: Image acquisition challenging due to uncooperative patient. IMPRESSIONS  1. Left ventricular ejection fraction, by estimation, is 20 to 25%. The left ventricle has severely decreased function. The left ventricle demonstrates global hypokinesis. The left ventricular internal cavity size was severely dilated. There is moderate  left ventricular hypertrophy. Left ventricular diastolic parameters are indeterminate.  2. Right ventricular systolic function is normal. The right ventricular size is normal.  3. Left atrial size was moderately dilated.  4. The mitral valve is normal in structure. No evidence of mitral valve regurgitation. No evidence of mitral stenosis.  5. The aortic valve has an indeterminant number of cusps. Aortic valve regurgitation is not visualized. No aortic stenosis is present.  6. Aortic dilatation noted.  There is mild dilatation of the ascending aorta, measuring 40 mm.  7. Agitated saline contrast bubble study was negative, with no evidence of any interatrial shunt. FINDINGS  Left Ventricle: Left ventricular ejection fraction, by estimation, is 20 to 25%. The left ventricle has severely decreased function. The left ventricle demonstrates global hypokinesis. The left ventricular internal cavity size was severely dilated. There is moderate left ventricular hypertrophy. Left ventricular diastolic parameters are indeterminate. Right Ventricle: The right ventricular size is normal. Right ventricular systolic function is normal. Left Atrium: Left atrial size was moderately dilated. Right Atrium: Right atrial size was normal in size. Pericardium: Trivial pericardial effusion is present. Mitral Valve: The mitral valve is normal in structure. Mild mitral annular calcification. No evidence of mitral valve regurgitation. No evidence of mitral valve stenosis. Tricuspid Valve: The tricuspid valve is normal in structure. Tricuspid valve regurgitation is trivial. No evidence of tricuspid stenosis. Aortic Valve: The aortic valve has an indeterminant number of cusps. Aortic valve regurgitation is not visualized. No aortic stenosis is present. Pulmonic Valve: The pulmonic valve was not well visualized. Pulmonic valve regurgitation is not visualized. No evidence of pulmonic stenosis. Aorta: Aortic dilatation noted. There is mild dilatation of the ascending aorta, measuring 40 mm. Venous: The inferior vena cava was not well visualized. IAS/Shunts: No atrial level shunt detected by color flow Doppler. Agitated saline contrast was given intravenously to evaluate for intracardiac shunting. Agitated saline contrast bubble study was negative, with no evidence of any interatrial shunt. Additional Comments: A device lead is visualized.  LEFT VENTRICLE PLAX 2D LVIDd:         6.40 cm      Diastology LVIDs:         5.40 cm      LV e' medial:   4.13 cm/s LV PW:         1.50 cm      LV e' lateral: 4.90 cm/s LV IVS:        1.10 cm LVOT diam:     2.40 cm LV SV:         66 LV SV Index:   28 LVOT Area:     4.52 cm  LV Volumes (MOD) LV vol d, MOD A2C: 156.0 ml LV vol s, MOD A2C: 102.0 ml LV SV MOD A2C:     54.0 ml RIGHT VENTRICLE RV Basal diam:  3.20 cm RV S prime:     13.70 cm/s TAPSE (M-mode): 1.6 cm LEFT ATRIUM              Index        RIGHT ATRIUM           Index LA diam:  4.70 cm  1.97 cm/m   RA Area:     18.10 cm LA Vol (A2C):   90.6 ml  38.00 ml/m  RA Volume:   49.40 ml  20.72 ml/m LA Vol (A4C):   107.0 ml 44.87 ml/m LA Biplane Vol: 103.0 ml 43.20 ml/m  AORTIC VALVE LVOT Vmax:   83.80 cm/s LVOT Vmean:  51.400 cm/s LVOT VTI:    0.146 m  AORTA Ao Root diam: 3.40 cm Ao Asc diam:  4.00 cm  SHUNTS Systemic VTI:  0.15 m Systemic Diam: 2.40 cm Olga Millers MD Electronically signed by Olga Millers MD Signature Date/Time: 12/18/2022/12:13:29 PM    Final    CT HEAD WO CONTRAST  Result Date: 12/18/2022 CLINICAL DATA:  Follow-up examination for code stroke. EXAM: CT HEAD WITHOUT CONTRAST TECHNIQUE: Contiguous axial images were obtained from the base of the skull through the vertex without intravenous contrast. RADIATION DOSE REDUCTION: This exam was performed according to the departmental dose-optimization program which includes automated exposure control, adjustment of the mA and/or kV according to patient size and/or use of iterative reconstruction technique. COMPARISON:  Comparison made with prior CT from 12/16/2022. FINDINGS: Brain: Generalized age-related cerebral atrophy with chronic microvascular ischemic disease. Few small remote lacunar infarcts present about the bilateral basal ganglia. Chronic infarct involving the right parieto-occipital region, stable. No acute intracranial hemorrhage. No visible acute or evolving large vessel territory infarct. No mass lesion, midline shift or mass effect. No hydrocephalus or extra-axial fluid  collection. Vascular: No convincing abnormal hyperdense vessel. Prominent calcified atherosclerosis present at the skull base. Skull: Scalp soft tissues demonstrate no acute finding. Calvarium intact. Sinuses/Orbits: Globes and orbital soft tissues within normal limits. Paranasal sinuses are largely clear. No mastoid effusion. Other: None. IMPRESSION: 1. Stable head CT. No acute intracranial hemorrhage or other abnormality. No visible acute or evolving large vessel territory infarct. 2. Generalized age-related cerebral atrophy with chronic microvascular ischemic disease, with a few small remote lacunar infarcts about the bilateral basal ganglia. 3. Chronic right parieto-occipital infarct, stable. Electronically Signed   By: Rise Mu M.D.   On: 12/18/2022 22:18   CT CERVICAL SPINE WO CONTRAST  Result Date: 12/06/2022 CLINICAL DATA:  Pain EXAM: CT CERVICAL SPINE WITHOUT CONTRAST TECHNIQUE: Multidetector CT imaging of the cervical spine was performed without intravenous contrast. Multiplanar CT image reconstructions were also generated. RADIATION DOSE REDUCTION: This exam was performed according to the departmental dose-optimization program which includes automated exposure control, adjustment of the mA and/or kV according to patient size and/or use of iterative reconstruction technique. COMPARISON:  Cervical spine CT 09/19/2022 FINDINGS: Alignment: Normal. Skull base and vertebrae: No acute fracture. No primary bone lesion or focal pathologic process. Soft tissues and spinal canal: No prevertebral fluid or swelling. No visible canal hematoma. Disc levels: There is mild disc space narrowing and endplate osteophyte formation at C5-C6 and C6-C7. No significant central canal or neural foraminal stenosis at any level. Upper chest: Negative. Other: There are atherosclerotic calcifications of the bilateral carotid artery bifurcations. IMPRESSION: No acute fracture or traumatic subluxation of the cervical  spine. Electronically Signed   By: Darliss Cheney M.D.   On: 12/04/2022 18:17   CT HEAD CODE STROKE WO CONTRAST  Result Date: 12/16/2022 CLINICAL DATA:  Code stroke. Neuro deficit, acute, stroke suspected. EXAM: CT HEAD WITHOUT CONTRAST TECHNIQUE: Contiguous axial images were obtained from the base of the skull through the vertex without intravenous contrast. RADIATION DOSE REDUCTION: This exam was performed according to the departmental dose-optimization program  which includes automated exposure control, adjustment of the mA and/or kV according to patient size and/or use of iterative reconstruction technique. COMPARISON:  Head CT 09/19/2022. FINDINGS: Brain: No acute hemorrhage. 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 No new loss of gray-white differentiation. No hydrocephalus or extra-axial collection. No mass effect or midline shift. Vascular: No hyperdense vessel or unexpected calcification. Skull: No calvarial fracture or suspicious bone lesion. Skull base is unremarkable. Sinuses/Orbits: No acute finding. Other: None. ASPECTS [facility] Stroke Program Early CT Score) - Ganglionic level infarction (caudate, lentiform nuclei, internal capsule, insula, M1-M3 cortex): 7 - Supraganglionic infarction (M4-M6 cortex): 3 Total score (0-10 with 10 being normal): 10 IMPRESSION: 1. No acute intracranial hemorrhage or evidence of acute large vessel territory infarct. ASPECT score is 10. 2. 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 Code stroke imaging results were communicated on 12/28/2022 at 6:06 pm to provider Dr. Selina Cooley via secure text paging. Electronically Signed   By: Orvan Falconer M.D.   On: 12/22/2022 18:06   CUP PACEART REMOTE DEVICE CHECK  Result Date: 12/05/2022 Scheduled remote reviewed. Normal device function.  Within the monitoring period, HF diagnostics have been abnormal.  Multiple  AF detections totaling 0.6% of the time, EGMs consistent with NSR and SB with ectopic beats. Next remote 91 days. - CS, CVRS   Microbiology Recent Results (from the past 240 hour(s))  MRSA Next Gen by PCR, Nasal     Status: None   Collection Time: 12/20/22  5:51 AM   Specimen: Nasal Mucosa; Nasal Swab  Result Value Ref Range Status   MRSA by PCR Next Gen NOT DETECTED NOT DETECTED Final    Comment: (NOTE) The GeneXpert MRSA Assay (FDA approved for NASAL specimens only), is one component of a comprehensive MRSA colonization surveillance program. It is not intended to diagnose MRSA infection nor to guide or monitor treatment for MRSA infections. Test performance is not FDA approved in patients less than 55 years old. Performed at [facility], [address], [place], [place] [address]   Culture, Respiratory w Gram Stain     Status: None (Preliminary result)   Collection Time: 12/21/22  8:31 AM   Specimen: Tracheal Aspirate; Respiratory  Result Value Ref Range Status   Specimen Description TRACHEAL ASPIRATE  Final   Special Requests NONE  Final   Gram Stain   Final    MODERATE WBC PRESENT, PREDOMINANTLY PMN FEW GRAM POSITIVE COCCI IN PAIRS    Culture   Final    CULTURE REINCUBATED FOR BETTER GROWTH Performed at [facility], [address] [place], [place] [address]    Report Status PENDING  Incomplete    Lab Basic Metabolic Panel: Recent Labs  Lab 12/18/22 0730 12/22/2022 1106 12/10/2022 2233 12/20/22 0002 12/20/22 0740 12/21/22 0500 12/21/22 1735 12/22/22 0617  NA 141 141   < > 141 138 140 144 146*  K 3.1* 4.1   < > 4.5 4.1 3.9 4.1 3.8  CL 109 108  --   --  108 110  --  110  CO2 21* 20*  --   --  20* 21*  --  22  GLUCOSE 122* 119*  --   --  149* 132*  --  148*  BUN 18 19  --   --  35* 57*  --  91*  CREATININE 1.79* 1.71*  --   --  2.07* 2.77*  --  3.65*  CALCIUM 9.2 9.4  --   --  9.1 8.9  --  8.5*  MG 2.0 1.9  --   --   --  2.2  --   --   PHOS 2.8 2.6   --   --   --   --   --   --    < > = values in this interval not displayed.   Liver Function Tests: Recent Labs  Lab 12/21/2022 1748 12/18/22 0730 12/07/2022 1106  AST 14*  --   --   ALT 13  --   --   ALKPHOS 57  --   --   BILITOT 0.9  --   --   PROT 6.8  --   --   ALBUMIN 3.9 3.7 3.8   No results for input(s): "LIPASE", "AMYLASE" in the last 168 hours. Recent Labs  Lab 12/21/2022 1656  AMMONIA 32   CBC: Recent Labs  Lab 12/26/2022 1748 12/03/2022 1753 12/26/2022 0846 12/12/2022 2233 12/20/22 0002 12/20/22 0740 12/21/22 0500 12/21/22 1735 12/22/22 0617  WBC 9.4  --  12.7*  --   --  15.8* 17.7*  --  11.3*  NEUTROABS 7.5  --   --   --   --   --   --   --   --   HGB 16.9   < > 19.0*   < > 16.7 17.5* 15.6 14.6 14.0  HCT 50.5   < > 54.8*   < > 49.0 53.3* 47.0 43.0 43.6  MCV 90.7  --  90.4  --   --  91.7 92.9  --  95.0  PLT 170  --  195  --   --  177 161  --  132*   < > = values in this interval not displayed.   Cardiac Enzymes: No results for input(s): "CKTOTAL", "CKMB", "CKMBINDEX", "TROPONINI" in the last 168 hours. 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 ASPECTS 10.    CT cervical spine no acute fracture or subluxation  CT head 24hr p/ost stroke stable, unchanged Not able to have MRI due to generator and leads are from different providers CTA head neck 8/19 1835 occluded basilar artery S/p IR with BA recanalization, However, contrast extravasation was noted from the right PCA  with was treated with coils + n-BCA. Underlying severe stenosis of the basilar artery was treated with balloon angioplasty.  CT head s/p IR 8/20 0300 hyperdense material in the subarachnoid spaces of the inferomedial parietal lobes, right greater than left occipital lobes, and right greater than left cerebellar hemispheres, favored to represent contrast staining and a component of subarachnoid hemorrhage, which appears roughly unchanged from the intra-procedural CT  CT head repeat 8/20 1330 Minimal fading of subarachnoid contrast/hemorrhage in the posterior fossa and right more than left occipital convexity. Carotid Doppler b/l ICA 40-59% stenosis  2D Echo EF 20-25%. Negative bubble.  LDL 88 HgbA1c 5.5 VTE prophylaxis - on heparin IV Xarelto (rivaroxaban) daily and aspirin 81 prior to admission, Stopped Xarelto for [facility], on ASA 81  Disposition:  pending, pt now poor outcome and prognosis. Transition to comfort care this evening   GOC - Transition to comfort care Per palliative care discussion with family on 8/22 Compassionate extubation on 8/22 evening when the pt's girlfriend arrives   Hx of Stroke/TIA CVA 16 years ago with residue visual difficulty 10/2021 admitted in [place] for generalized weakness and slurred speech.  CT no acute finding.  CT head and neck multiple severe intracranial stenosis, most notably moderate severe stenosis right M1, short segment proximal right A1, moderate severe stenosis of basilar artery.  Right P2 and P3 segment of occlusion.  Left V4 occlusion, severe near complete atherosclerotic narrowing of bilateral ICAs, right VA origin stenosis.  Continue on aspirin, Xarelto and Lipitor.   Atrial fibrillation Home Meds: Xarelto 20 mg once daily, carvedilol 12.5 mg one and half pills twice daily, amiodarone 200 once dialy On amiodraone, coreg Continue telemetry monitoring ASA 81 mg only current s/p IR w/ SAH   Hypertension Home meds:  carvedilol 12.5 mg one and half  pills twice daily, entresto 49-51 mg twice daily, hydralazine 100 mg twice daily  Hypotensive currently from propofol and pressure support with levophed BP goal: SBP 120-160   Hyperlipidemia Home meds:  atorvastatin 10 mg and zetia 10 mg once daily  LDL 88, goal < 70 Increase Lipitor to 20 and continue Zetia 10.   CHF CAD Status post ICD in 2012, generator change out in 2021 Not compatible with MRI due to generator and leads from different companies 09/2020 TEE EF 30-35% This admission EF 20-25% On GDMT at home   Substance Use Patient uses cannabis UDS positive for THC    Dysphagia Currently intubated Not able to handle oral secretions On TF    Other Stroke Risk Factors Obesity, Body mass index is 31.3 kg/m., BMI >/= 30 associated with increased stroke risk, recommend weight loss, diet and exercise as appropriate  History of cocaine use OSA not on CPAP   Other Active Problems HFrEF s/p AICD, managed by primary team AKI on CKD, Cr 1.7 --> 2.1 (BUN 35) Leuokocytosis, WBC 12.7 --> 15.8 Polycythemia Hb 16.9->17.7->19.0->18.7->17.5   Meryl Dare 12/15/2022, 9:05 AM  I have personally obtained history,examined this patient, reviewed notes, independently viewed imaging studies, participated in medical decision making and plan of care.ROS completed by me personally and pertinent positives fully documented  I have made any additions or clarifications directly to the above note. Agree with note above.    Delia Heady, MD Medical Director [facility] Pager: [phone] 12/29/2022 12:24 PM

## 2023-01-01 DEATH — deceased

## 2023-01-30 ENCOUNTER — Ambulatory Visit: Payer: Medicaid Other | Admitting: Internal Medicine

## 2023-02-06 ENCOUNTER — Ambulatory Visit (HOSPITAL_COMMUNITY): Admit: 2023-02-06 | Payer: Medicaid Other | Admitting: Gastroenterology

## 2023-02-06 SURGERY — COLONOSCOPY WITH PROPOFOL
Anesthesia: Monitor Anesthesia Care

## 2023-02-08 ENCOUNTER — Encounter (INDEPENDENT_AMBULATORY_CARE_PROVIDER_SITE_OTHER): Payer: Medicaid Other | Admitting: Ophthalmology

## 2023-03-06 ENCOUNTER — Ambulatory Visit: Payer: Medicaid Other

## 2023-06-05 ENCOUNTER — Ambulatory Visit: Payer: Medicaid Other

## 2023-09-04 ENCOUNTER — Ambulatory Visit: Payer: Medicaid Other

## 2023-12-04 ENCOUNTER — Ambulatory Visit: Payer: Medicaid Other

## 2024-03-04 ENCOUNTER — Ambulatory Visit: Payer: Medicaid Other

## 2024-06-03 ENCOUNTER — Ambulatory Visit: Payer: Medicaid Other

## 2024-09-02 ENCOUNTER — Ambulatory Visit: Payer: Medicaid Other

## 2024-12-02 ENCOUNTER — Ambulatory Visit: Payer: Medicaid Other

## 2025-03-03 ENCOUNTER — Ambulatory Visit: Payer: Medicaid Other
